# Patient Record
Sex: Female | Born: 1960 | Race: White | Hispanic: No | Marital: Married | Smoking: Never smoker
Health system: Southern US, Community
[De-identification: ages and names within clinical notes are randomized; demographics above are authoritative.]

## PROBLEM LIST (undated history)

## (undated) DIAGNOSIS — M1990 Unspecified osteoarthritis, unspecified site: Secondary | ICD-10-CM

## (undated) DIAGNOSIS — M5090 Cervical disc disorder, unspecified, unspecified cervical region: Secondary | ICD-10-CM

## (undated) DIAGNOSIS — I4819 Other persistent atrial fibrillation: Secondary | ICD-10-CM

## (undated) DIAGNOSIS — Z9889 Other specified postprocedural states: Secondary | ICD-10-CM

## (undated) DIAGNOSIS — Z8679 Personal history of other diseases of the circulatory system: Secondary | ICD-10-CM

## (undated) DIAGNOSIS — K5792 Diverticulitis of intestine, part unspecified, without perforation or abscess without bleeding: Secondary | ICD-10-CM

## (undated) DIAGNOSIS — I495 Sick sinus syndrome: Secondary | ICD-10-CM

## (undated) DIAGNOSIS — R112 Nausea with vomiting, unspecified: Secondary | ICD-10-CM

## (undated) DIAGNOSIS — I739 Peripheral vascular disease, unspecified: Secondary | ICD-10-CM

## (undated) DIAGNOSIS — K760 Fatty (change of) liver, not elsewhere classified: Secondary | ICD-10-CM

## (undated) DIAGNOSIS — Z8774 Personal history of (corrected) congenital malformations of heart and circulatory system: Secondary | ICD-10-CM

## (undated) DIAGNOSIS — H6980 Other specified disorders of Eustachian tube, unspecified ear: Secondary | ICD-10-CM

## (undated) DIAGNOSIS — D131 Benign neoplasm of stomach: Secondary | ICD-10-CM

## (undated) DIAGNOSIS — K1120 Sialoadenitis, unspecified: Secondary | ICD-10-CM

## (undated) DIAGNOSIS — K829 Disease of gallbladder, unspecified: Secondary | ICD-10-CM

## (undated) DIAGNOSIS — K219 Gastro-esophageal reflux disease without esophagitis: Secondary | ICD-10-CM

## (undated) DIAGNOSIS — T7840XA Allergy, unspecified, initial encounter: Secondary | ICD-10-CM

## (undated) DIAGNOSIS — Z8614 Personal history of Methicillin resistant Staphylococcus aureus infection: Secondary | ICD-10-CM

## (undated) DIAGNOSIS — E669 Obesity, unspecified: Secondary | ICD-10-CM

## (undated) DIAGNOSIS — K449 Diaphragmatic hernia without obstruction or gangrene: Secondary | ICD-10-CM

## (undated) DIAGNOSIS — I82409 Acute embolism and thrombosis of unspecified deep veins of unspecified lower extremity: Secondary | ICD-10-CM

## (undated) DIAGNOSIS — F419 Anxiety disorder, unspecified: Secondary | ICD-10-CM

## (undated) HISTORY — DX: Diaphragmatic hernia without obstruction or gangrene: K44.9

## (undated) HISTORY — DX: Fatty (change of) liver, not elsewhere classified: K76.0

## (undated) HISTORY — DX: Nausea with vomiting, unspecified: R11.2

## (undated) HISTORY — PX: JOINT REPLACEMENT: SHX530

## (undated) HISTORY — DX: Disease of gallbladder, unspecified: K82.9

## (undated) HISTORY — DX: Other persistent atrial fibrillation: I48.19

## (undated) HISTORY — DX: Benign neoplasm of stomach: D13.1

## (undated) HISTORY — PX: TUBAL LIGATION: SHX77

## (undated) HISTORY — PX: FOOT SURGERY: SHX648

## (undated) HISTORY — PX: ELBOW SURGERY: SHX618

## (undated) HISTORY — DX: Other specified disorders of Eustachian tube, unspecified ear: H69.80

## (undated) HISTORY — DX: Other specified postprocedural states: Z98.890

## (undated) HISTORY — PX: CERVICAL FUSION: SHX112

## (undated) HISTORY — PX: SHOULDER SURGERY: SHX246

## (undated) HISTORY — PX: WRIST SURGERY: SHX841

## (undated) HISTORY — PX: REDUCTION MAMMAPLASTY: SUR839

## (undated) HISTORY — DX: Allergy, unspecified, initial encounter: T78.40XA

## (undated) HISTORY — PX: SPINE SURGERY: SHX786

## (undated) HISTORY — DX: Sialoadenitis, unspecified: K11.20

## (undated) HISTORY — PX: CHOLECYSTECTOMY: SHX55

## (undated) HISTORY — DX: Acute embolism and thrombosis of unspecified deep veins of unspecified lower extremity: I82.409

## (undated) HISTORY — DX: Personal history of Methicillin resistant Staphylococcus aureus infection: Z86.14

## (undated) HISTORY — DX: Obesity, unspecified: E66.9

## (undated) HISTORY — DX: Cervical disc disorder, unspecified, unspecified cervical region: M50.90

## (undated) HISTORY — DX: Gastro-esophageal reflux disease without esophagitis: K21.9

## (undated) HISTORY — DX: Diverticulitis of intestine, part unspecified, without perforation or abscess without bleeding: K57.92

## (undated) HISTORY — PX: KNEE SURGERY: SHX244

## (undated) HISTORY — PX: FLEXIBLE SIGMOIDOSCOPY: SHX1649

---

## 1998-01-17 ENCOUNTER — Other Ambulatory Visit: Admission: RE | Admit: 1998-01-17 | Discharge: 1998-01-17 | Payer: Self-pay | Admitting: Obstetrics and Gynecology

## 1998-04-15 ENCOUNTER — Other Ambulatory Visit: Admission: RE | Admit: 1998-04-15 | Discharge: 1998-04-15 | Payer: Self-pay | Admitting: Obstetrics & Gynecology

## 1998-06-10 ENCOUNTER — Ambulatory Visit (HOSPITAL_COMMUNITY): Admission: RE | Admit: 1998-06-10 | Discharge: 1998-06-10 | Payer: Self-pay | Admitting: Obstetrics and Gynecology

## 1998-09-05 ENCOUNTER — Ambulatory Visit (HOSPITAL_COMMUNITY): Admission: RE | Admit: 1998-09-05 | Discharge: 1998-09-05 | Payer: Self-pay | Admitting: Obstetrics and Gynecology

## 1998-10-10 ENCOUNTER — Encounter: Payer: Self-pay | Admitting: Obstetrics and Gynecology

## 1998-10-10 ENCOUNTER — Ambulatory Visit (HOSPITAL_COMMUNITY): Admission: RE | Admit: 1998-10-10 | Discharge: 1998-10-10 | Payer: Self-pay | Admitting: Obstetrics and Gynecology

## 1998-10-25 ENCOUNTER — Inpatient Hospital Stay (HOSPITAL_COMMUNITY): Admission: AD | Admit: 1998-10-25 | Discharge: 1998-10-30 | Payer: Self-pay | Admitting: *Deleted

## 1998-11-06 ENCOUNTER — Encounter (HOSPITAL_COMMUNITY): Admission: RE | Admit: 1998-11-06 | Discharge: 1999-02-04 | Payer: Self-pay | Admitting: *Deleted

## 1999-05-16 ENCOUNTER — Other Ambulatory Visit: Admission: RE | Admit: 1999-05-16 | Discharge: 1999-05-16 | Payer: Self-pay | Admitting: *Deleted

## 2000-01-19 ENCOUNTER — Encounter: Payer: Self-pay | Admitting: Allergy and Immunology

## 2000-01-19 ENCOUNTER — Encounter: Admission: RE | Admit: 2000-01-19 | Discharge: 2000-01-19 | Payer: Self-pay | Admitting: Allergy and Immunology

## 2000-06-07 ENCOUNTER — Other Ambulatory Visit: Admission: RE | Admit: 2000-06-07 | Discharge: 2000-06-07 | Payer: Self-pay | Admitting: *Deleted

## 2000-10-19 ENCOUNTER — Ambulatory Visit (HOSPITAL_COMMUNITY): Admission: RE | Admit: 2000-10-19 | Discharge: 2000-10-19 | Payer: Self-pay | Admitting: Obstetrics and Gynecology

## 2000-10-19 ENCOUNTER — Encounter: Payer: Self-pay | Admitting: Obstetrics and Gynecology

## 2000-11-19 ENCOUNTER — Inpatient Hospital Stay (HOSPITAL_COMMUNITY): Admission: AD | Admit: 2000-11-19 | Discharge: 2000-11-19 | Payer: Self-pay | Admitting: Obstetrics and Gynecology

## 2002-10-09 ENCOUNTER — Other Ambulatory Visit: Admission: RE | Admit: 2002-10-09 | Discharge: 2002-10-09 | Payer: Self-pay | Admitting: Obstetrics and Gynecology

## 2002-10-11 ENCOUNTER — Encounter: Payer: Self-pay | Admitting: Obstetrics and Gynecology

## 2002-10-11 ENCOUNTER — Ambulatory Visit (HOSPITAL_COMMUNITY): Admission: RE | Admit: 2002-10-11 | Discharge: 2002-10-11 | Payer: Self-pay | Admitting: Obstetrics and Gynecology

## 2002-10-17 ENCOUNTER — Encounter: Payer: Self-pay | Admitting: Obstetrics and Gynecology

## 2002-10-17 ENCOUNTER — Encounter (INDEPENDENT_AMBULATORY_CARE_PROVIDER_SITE_OTHER): Payer: Self-pay | Admitting: Specialist

## 2002-10-17 ENCOUNTER — Encounter: Admission: RE | Admit: 2002-10-17 | Discharge: 2002-10-17 | Payer: Self-pay | Admitting: Obstetrics and Gynecology

## 2003-01-14 ENCOUNTER — Encounter: Payer: Self-pay | Admitting: *Deleted

## 2003-01-14 ENCOUNTER — Inpatient Hospital Stay (HOSPITAL_COMMUNITY): Admission: EM | Admit: 2003-01-14 | Discharge: 2003-01-15 | Payer: Self-pay

## 2003-01-15 ENCOUNTER — Encounter: Payer: Self-pay | Admitting: Cardiology

## 2003-01-15 ENCOUNTER — Encounter: Payer: Self-pay | Admitting: Cardiovascular Disease

## 2003-07-06 ENCOUNTER — Ambulatory Visit (HOSPITAL_COMMUNITY): Admission: RE | Admit: 2003-07-06 | Discharge: 2003-07-06 | Payer: Self-pay | Admitting: Obstetrics and Gynecology

## 2003-08-18 HISTORY — PX: TUBAL LIGATION: SHX77

## 2003-10-12 ENCOUNTER — Inpatient Hospital Stay (HOSPITAL_COMMUNITY): Admission: AD | Admit: 2003-10-12 | Discharge: 2003-10-12 | Payer: Self-pay | Admitting: Obstetrics and Gynecology

## 2003-11-19 ENCOUNTER — Encounter (INDEPENDENT_AMBULATORY_CARE_PROVIDER_SITE_OTHER): Payer: Self-pay | Admitting: *Deleted

## 2003-11-19 ENCOUNTER — Inpatient Hospital Stay (HOSPITAL_COMMUNITY): Admission: RE | Admit: 2003-11-19 | Discharge: 2003-11-23 | Payer: Self-pay | Admitting: Obstetrics and Gynecology

## 2004-04-07 ENCOUNTER — Other Ambulatory Visit: Admission: RE | Admit: 2004-04-07 | Discharge: 2004-04-07 | Payer: Self-pay | Admitting: Obstetrics and Gynecology

## 2004-08-05 ENCOUNTER — Ambulatory Visit: Payer: Self-pay | Admitting: Internal Medicine

## 2004-08-17 HISTORY — PX: CHOLECYSTECTOMY: SHX55

## 2004-08-27 ENCOUNTER — Ambulatory Visit: Payer: Self-pay | Admitting: Internal Medicine

## 2005-02-10 ENCOUNTER — Ambulatory Visit: Payer: Self-pay | Admitting: Cardiology

## 2005-02-11 ENCOUNTER — Ambulatory Visit: Payer: Self-pay | Admitting: Cardiology

## 2005-03-04 ENCOUNTER — Ambulatory Visit: Payer: Self-pay

## 2005-03-19 ENCOUNTER — Ambulatory Visit: Payer: Self-pay

## 2005-04-15 ENCOUNTER — Ambulatory Visit: Payer: Self-pay | Admitting: Cardiology

## 2005-04-20 ENCOUNTER — Emergency Department (HOSPITAL_COMMUNITY): Admission: EM | Admit: 2005-04-20 | Discharge: 2005-04-20 | Payer: Self-pay | Admitting: Emergency Medicine

## 2005-06-26 ENCOUNTER — Other Ambulatory Visit: Admission: RE | Admit: 2005-06-26 | Discharge: 2005-06-26 | Payer: Self-pay | Admitting: Obstetrics and Gynecology

## 2006-02-11 ENCOUNTER — Encounter: Admission: RE | Admit: 2006-02-11 | Discharge: 2006-02-11 | Payer: Self-pay | Admitting: Orthopaedic Surgery

## 2006-02-25 ENCOUNTER — Inpatient Hospital Stay (HOSPITAL_COMMUNITY): Admission: RE | Admit: 2006-02-25 | Discharge: 2006-02-26 | Payer: Self-pay | Admitting: Orthopaedic Surgery

## 2006-06-29 ENCOUNTER — Ambulatory Visit: Payer: Self-pay | Admitting: Internal Medicine

## 2006-07-06 ENCOUNTER — Ambulatory Visit (HOSPITAL_BASED_OUTPATIENT_CLINIC_OR_DEPARTMENT_OTHER): Admission: RE | Admit: 2006-07-06 | Discharge: 2006-07-06 | Payer: Self-pay | Admitting: Orthopedic Surgery

## 2006-07-21 ENCOUNTER — Ambulatory Visit (HOSPITAL_COMMUNITY): Admission: RE | Admit: 2006-07-21 | Discharge: 2006-07-21 | Payer: Self-pay | Admitting: Obstetrics and Gynecology

## 2006-08-17 DIAGNOSIS — Z8614 Personal history of Methicillin resistant Staphylococcus aureus infection: Secondary | ICD-10-CM

## 2006-08-17 HISTORY — DX: Personal history of Methicillin resistant Staphylococcus aureus infection: Z86.14

## 2006-10-20 ENCOUNTER — Encounter: Admission: RE | Admit: 2006-10-20 | Discharge: 2006-10-20 | Payer: Self-pay | Admitting: Orthopaedic Surgery

## 2006-11-15 ENCOUNTER — Ambulatory Visit: Payer: Self-pay | Admitting: Internal Medicine

## 2006-11-15 LAB — CONVERTED CEMR LAB
ALT: 22 units/L (ref 0–40)
AST: 21 units/L (ref 0–37)
Albumin: 4.3 g/dL (ref 3.5–5.2)
Alkaline Phosphatase: 80 units/L (ref 39–117)
Amylase: 51 units/L (ref 27–131)
BUN: 13 mg/dL (ref 6–23)
Basophils Absolute: 0 10*3/uL (ref 0.0–0.1)
Basophils Relative: 0.5 % (ref 0.0–1.0)
Bilirubin, Direct: 0.1 mg/dL (ref 0.0–0.3)
CO2: 28 meq/L (ref 19–32)
Calcium: 9.3 mg/dL (ref 8.4–10.5)
Chloride: 104 meq/L (ref 96–112)
Cholesterol: 205 mg/dL (ref 0–200)
Creatinine, Ser: 0.7 mg/dL (ref 0.4–1.2)
Direct LDL: 139.5 mg/dL
Eosinophils Absolute: 0.1 10*3/uL (ref 0.0–0.6)
Eosinophils Relative: 0.8 % (ref 0.0–5.0)
GFR calc Af Amer: 117 mL/min
GFR calc non Af Amer: 97 mL/min
Glucose, Bld: 103 mg/dL — ABNORMAL HIGH (ref 70–99)
HCT: 39.4 % (ref 36.0–46.0)
HDL: 54 mg/dL (ref >39.0)
Hemoglobin: 13.6 g/dL (ref 12.0–15.0)
Lymphocytes Relative: 20.8 % (ref 12.0–46.0)
MCHC: 34.5 g/dL (ref 30.0–36.0)
MCV: 87.4 fL (ref 78.0–100.0)
Monocytes Absolute: 0.4 10*3/uL (ref 0.2–0.7)
Monocytes Relative: 5 % (ref 3.0–11.0)
Neutro Abs: 5.2 10*3/uL (ref 1.4–7.7)
Neutrophils Relative %: 72.9 % (ref 43.0–77.0)
Platelets: 337 10*3/uL (ref 150–400)
Potassium: 3.6 meq/L (ref 3.5–5.1)
RBC: 4.51 M/uL (ref 3.87–5.11)
RDW: 13.1 % (ref 11.5–14.6)
Sodium: 138 meq/L (ref 135–145)
Total Bilirubin: 0.8 mg/dL (ref 0.3–1.2)
Total CHOL/HDL Ratio: 3.8
Total Protein: 7 g/dL (ref 6.0–8.3)
Triglycerides: 59 mg/dL (ref 0–149)
VLDL: 12 mg/dL (ref 0–40)
WBC: 7.2 10*3/uL (ref 4.5–10.5)

## 2006-11-16 ENCOUNTER — Ambulatory Visit: Payer: Self-pay | Admitting: Gastroenterology

## 2006-12-13 ENCOUNTER — Encounter (INDEPENDENT_AMBULATORY_CARE_PROVIDER_SITE_OTHER): Payer: Self-pay | Admitting: Specialist

## 2006-12-13 ENCOUNTER — Ambulatory Visit (HOSPITAL_COMMUNITY): Admission: RE | Admit: 2006-12-13 | Discharge: 2006-12-13 | Payer: Self-pay | Admitting: General Surgery

## 2007-02-17 ENCOUNTER — Ambulatory Visit: Payer: Self-pay | Admitting: Cardiology

## 2007-03-01 ENCOUNTER — Ambulatory Visit: Payer: Self-pay

## 2007-03-01 ENCOUNTER — Encounter: Payer: Self-pay | Admitting: Cardiology

## 2007-03-05 DIAGNOSIS — I4891 Unspecified atrial fibrillation: Secondary | ICD-10-CM

## 2007-03-05 DIAGNOSIS — I4819 Other persistent atrial fibrillation: Secondary | ICD-10-CM

## 2007-03-05 HISTORY — DX: Other persistent atrial fibrillation: I48.19

## 2007-03-07 ENCOUNTER — Ambulatory Visit: Payer: Self-pay | Admitting: Infectious Disease

## 2007-03-25 ENCOUNTER — Inpatient Hospital Stay (HOSPITAL_COMMUNITY): Admission: RE | Admit: 2007-03-25 | Discharge: 2007-03-29 | Payer: Self-pay | Admitting: Specialist

## 2007-07-22 ENCOUNTER — Encounter (INDEPENDENT_AMBULATORY_CARE_PROVIDER_SITE_OTHER): Payer: Self-pay | Admitting: Specialist

## 2007-07-22 ENCOUNTER — Ambulatory Visit: Admission: RE | Admit: 2007-07-22 | Discharge: 2007-07-22 | Payer: Self-pay | Admitting: Specialist

## 2007-07-22 ENCOUNTER — Ambulatory Visit: Payer: Self-pay | Admitting: *Deleted

## 2007-07-25 ENCOUNTER — Ambulatory Visit (HOSPITAL_COMMUNITY): Admission: RE | Admit: 2007-07-25 | Discharge: 2007-07-25 | Payer: Self-pay | Admitting: Obstetrics and Gynecology

## 2008-02-09 ENCOUNTER — Encounter: Admission: RE | Admit: 2008-02-09 | Discharge: 2008-02-09 | Payer: Self-pay | Admitting: Orthopedic Surgery

## 2008-04-24 ENCOUNTER — Encounter: Admission: RE | Admit: 2008-04-24 | Discharge: 2008-04-24 | Payer: Self-pay | Admitting: Orthopaedic Surgery

## 2008-04-27 ENCOUNTER — Ambulatory Visit (HOSPITAL_COMMUNITY): Admission: RE | Admit: 2008-04-27 | Discharge: 2008-04-27 | Payer: Self-pay | Admitting: Orthopedic Surgery

## 2008-08-13 ENCOUNTER — Ambulatory Visit (HOSPITAL_COMMUNITY): Admission: RE | Admit: 2008-08-13 | Discharge: 2008-08-13 | Payer: Self-pay | Admitting: Obstetrics and Gynecology

## 2008-08-17 HISTORY — PX: CERVICAL FUSION: SHX112

## 2008-08-29 ENCOUNTER — Ambulatory Visit: Payer: Self-pay | Admitting: Cardiology

## 2008-08-29 LAB — CONVERTED CEMR LAB
BUN: 7 mg/dL (ref 6–23)
Calcium: 9.2 mg/dL (ref 8.4–10.5)
Chloride: 106 meq/L (ref 96–112)
Creatinine, Ser: 0.7 mg/dL (ref 0.4–1.2)
GFR calc Af Amer: 116 mL/min

## 2008-11-19 ENCOUNTER — Encounter: Payer: Self-pay | Admitting: Cardiology

## 2008-11-19 ENCOUNTER — Ambulatory Visit: Payer: Self-pay | Admitting: Cardiology

## 2008-11-19 LAB — CONVERTED CEMR LAB
BUN: 11 mg/dL (ref 6–23)
Basophils Relative: 0.6 % (ref 0.0–3.0)
CO2: 29 meq/L (ref 19–32)
Chloride: 105 meq/L (ref 96–112)
Creatinine, Ser: 0.7 mg/dL (ref 0.4–1.2)
Eosinophils Absolute: 0.2 10*3/uL (ref 0.0–0.7)
Eosinophils Relative: 1.6 % (ref 0.0–5.0)
GFR calc non Af Amer: 95.9 mL/min (ref >60)
MCV: 84 fL (ref 78.0–100.0)
Neutrophils Relative %: 76.4 % (ref 43.0–77.0)
Platelets: 332 10*3/uL (ref 150.0–400.0)
Potassium: 3.9 meq/L (ref 3.5–5.1)
RBC: 4.54 M/uL (ref 3.87–5.11)
RDW: 12.7 % (ref 11.5–14.6)
Sodium: 141 meq/L (ref 135–145)
TSH: 0.6 microintl units/mL (ref 0.35–5.50)
WBC: 10.1 10*3/uL (ref 4.5–10.5)

## 2008-12-04 ENCOUNTER — Encounter: Payer: Self-pay | Admitting: Cardiology

## 2008-12-04 ENCOUNTER — Ambulatory Visit: Payer: Self-pay | Admitting: Cardiology

## 2009-05-14 ENCOUNTER — Encounter: Admission: RE | Admit: 2009-05-14 | Discharge: 2009-05-14 | Payer: Self-pay | Admitting: *Deleted

## 2009-05-21 ENCOUNTER — Encounter: Admission: RE | Admit: 2009-05-21 | Discharge: 2009-05-21 | Payer: Self-pay | Admitting: Orthopaedic Surgery

## 2009-06-27 ENCOUNTER — Ambulatory Visit: Payer: Self-pay | Admitting: Internal Medicine

## 2009-08-21 ENCOUNTER — Encounter: Admission: RE | Admit: 2009-08-21 | Discharge: 2009-08-21 | Payer: Self-pay | Admitting: Plastic Surgery

## 2009-10-31 ENCOUNTER — Encounter: Payer: Self-pay | Admitting: Internal Medicine

## 2009-11-08 ENCOUNTER — Telehealth (INDEPENDENT_AMBULATORY_CARE_PROVIDER_SITE_OTHER): Payer: Self-pay | Admitting: Physician Assistant

## 2009-11-18 ENCOUNTER — Encounter: Payer: Self-pay | Admitting: Internal Medicine

## 2009-12-09 ENCOUNTER — Encounter: Admission: RE | Admit: 2009-12-09 | Discharge: 2009-12-09 | Payer: Self-pay | Admitting: Orthopaedic Surgery

## 2009-12-23 ENCOUNTER — Ambulatory Visit: Payer: Self-pay | Admitting: Internal Medicine

## 2009-12-23 DIAGNOSIS — K219 Gastro-esophageal reflux disease without esophagitis: Secondary | ICD-10-CM

## 2009-12-23 HISTORY — DX: Gastro-esophageal reflux disease without esophagitis: K21.9

## 2009-12-24 LAB — CONVERTED CEMR LAB
ALT: 14 units/L (ref 0–35)
Albumin: 4.6 g/dL (ref 3.5–5.2)
Alkaline Phosphatase: 87 units/L (ref 39–117)
BUN: 12 mg/dL (ref 6–23)
Basophils Absolute: 0 10*3/uL (ref 0.0–0.1)
Basophils Relative: 0.3 % (ref 0.0–3.0)
Bilirubin, Direct: 0.1 mg/dL (ref 0.0–0.3)
Calcium: 9.6 mg/dL (ref 8.4–10.5)
Eosinophils Absolute: 0 10*3/uL (ref 0.0–0.7)
Eosinophils Relative: 0.5 % (ref 0.0–5.0)
Glucose, Bld: 91 mg/dL (ref 70–99)
Lymphocytes Relative: 19.7 % (ref 12.0–46.0)
Lymphs Abs: 1.7 10*3/uL (ref 0.7–4.0)
Monocytes Relative: 5.5 % (ref 3.0–12.0)
Neutro Abs: 6.5 10*3/uL (ref 1.4–7.7)
Neutrophils Relative %: 74 % (ref 43.0–77.0)
Platelets: 344 10*3/uL (ref 150.0–400.0)
TSH: 2.27 microintl units/mL (ref 0.35–5.50)
Total Bilirubin: 0.6 mg/dL (ref 0.3–1.2)
Total Protein: 7.1 g/dL (ref 6.0–8.3)

## 2009-12-26 ENCOUNTER — Encounter (INDEPENDENT_AMBULATORY_CARE_PROVIDER_SITE_OTHER): Payer: Self-pay | Admitting: *Deleted

## 2010-01-10 ENCOUNTER — Encounter (INDEPENDENT_AMBULATORY_CARE_PROVIDER_SITE_OTHER): Payer: Self-pay | Admitting: *Deleted

## 2010-01-16 ENCOUNTER — Ambulatory Visit: Payer: Self-pay | Admitting: Internal Medicine

## 2010-01-21 ENCOUNTER — Telehealth: Payer: Self-pay | Admitting: Internal Medicine

## 2010-01-30 ENCOUNTER — Ambulatory Visit: Payer: Self-pay | Admitting: Internal Medicine

## 2010-01-30 HISTORY — PX: UPPER GASTROINTESTINAL ENDOSCOPY: SHX188

## 2010-02-02 ENCOUNTER — Encounter: Payer: Self-pay | Admitting: Internal Medicine

## 2010-02-05 ENCOUNTER — Encounter: Payer: Self-pay | Admitting: Internal Medicine

## 2010-03-18 ENCOUNTER — Ambulatory Visit: Payer: Self-pay | Admitting: Internal Medicine

## 2010-03-18 DIAGNOSIS — H6980 Other specified disorders of Eustachian tube, unspecified ear: Secondary | ICD-10-CM | POA: Insufficient documentation

## 2010-03-18 DIAGNOSIS — K1120 Sialoadenitis, unspecified: Secondary | ICD-10-CM | POA: Insufficient documentation

## 2010-04-29 ENCOUNTER — Encounter: Payer: Self-pay | Admitting: Internal Medicine

## 2010-05-02 ENCOUNTER — Ambulatory Visit: Payer: Self-pay | Admitting: Internal Medicine

## 2010-05-06 ENCOUNTER — Encounter: Payer: Self-pay | Admitting: Internal Medicine

## 2010-05-16 ENCOUNTER — Telehealth: Payer: Self-pay | Admitting: Internal Medicine

## 2010-09-07 ENCOUNTER — Encounter: Payer: Self-pay | Admitting: Orthopaedic Surgery

## 2010-09-08 ENCOUNTER — Encounter: Payer: Self-pay | Admitting: Orthopedic Surgery

## 2010-09-10 ENCOUNTER — Encounter
Admission: RE | Admit: 2010-09-10 | Discharge: 2010-09-10 | Payer: Self-pay | Source: Home / Self Care | Attending: Obstetrics and Gynecology | Admitting: Obstetrics and Gynecology

## 2010-09-15 ENCOUNTER — Ambulatory Visit
Admission: RE | Admit: 2010-09-15 | Discharge: 2010-09-15 | Payer: Self-pay | Source: Home / Self Care | Attending: Internal Medicine | Admitting: Internal Medicine

## 2010-09-15 DIAGNOSIS — J029 Acute pharyngitis, unspecified: Secondary | ICD-10-CM | POA: Insufficient documentation

## 2010-09-16 NOTE — Progress Notes (Signed)
  Phone Note Call from Patient   Reason for Call: Talk to Doctor Action Taken: Phone Call Completed Summary of Call: Pt called because she is having mult PVC's sounds like trigeminy from the description. She states Sx began today with a migraine. She feels stressed because of work and did not sleep well last pm. She took her Toprol at 6pm as usual. Advised her that with nl EF and MV she was not in danger, could take extra 1/2 Toprol at 10pm if Sx persist. She will do so, will call next wk for f/u if she does not get better. Initial call taken by: Park Breed PA-C,  November 08, 2009 8:00 PM

## 2010-09-16 NOTE — Progress Notes (Signed)
Summary: samples needed  Phone Note Call from Patient Call back at [phone]   Caller: Patient---live call Summary of Call: Need more Nexium samples unitl her Endo appt, which is 01-30-2010. Initial call taken by: Warnell Forester,  January 21, 2010 8:22 AM  Follow-up for Phone Call        There are no samples available.  Left message to inform pt. Follow-up by: Gladis Riffle, RN,  January 21, 2010 3:54 PM  Additional Follow-up for Phone Call Additional follow up Details #1::        [facility].  See Rx.  Pt aware. Additional Follow-up by: Gladis Riffle, RN,  January 21, 2010 4:42 PM    Prescriptions: NEXIUM 40 MG CPDR (ESOMEPRAZOLE MAGNESIUM) Take 1 tab each morning  #30 x 0   Entered by:   Gladis Riffle, RN   Authorized by:   Birdie Sons MD   Signed by:   Gladis Riffle, RN on 01/21/2010   Method used:   [facility]  [phone]* (retail)       [address] [place], [place]  [address]       Ph: [phone] or [phone]       Fax: [phone]   RxID:   [id number]

## 2010-09-16 NOTE — Letter (Signed)
Summary: [facility]  [facility]   Imported By: Maryln Gottron 02/25/2010 13:33:11  _____________________________________________________________________  External Attachment:    Type:   Image     Comment:   External Document

## 2010-09-16 NOTE — Letter (Signed)
Summary: Patient [facility] Biopsy Results  [facility]  [address] [place], [place] [address]   Phone: [phone]  Fax: [phone]        February 02, 2010 MRN: [id number]    [facility] [place] [address] [place], [place]  [address]    Dear Ms. Hubbard,  I am pleased to inform you that the biopsies taken during your recent endoscopic examination did not show any evidence of cancer upon pathologic examination.  The polyp was a benign fundic gland polyp that needs no specific follow-up.  Continue with the treatment plan as outlined on the day of your      exam and see me in august if you fail to improve (see plan on endoscopy report).  Please call us if you are having persistent problems or have questions about your condition that have not been fully answered at this time.  Sincerely,  Iva Boop MD, [facility]  This letter has been electronically signed by your physician.  Appended Document: Patient Notice-Endo Biopsy Results letter mailed.

## 2010-09-16 NOTE — Letter (Signed)
Summary: Previsit letter  [facility]  [address] [place], [place] [address]   Phone: [phone]  Fax: [phone]       12/26/2009 MRN: [id number]  [facility] [place] [address] [place], [place]  [address]  Dear Ms. Abrell,  Welcome to the Gastroenterology Division at [facility].    You are scheduled to see a nurse for your pre-procedure visit on 01/16/10 at 2:30pm on the 3rd floor at [facility], [address]  We ask that you try to arrive at our office 15 minutes prior to your appointment time to allow for check-in.  Your nurse visit will consist of discussing your medical and surgical history, your immediate family medical history, and your medications.    Please bring a complete list of all your medications or, if you prefer, bring the medication bottles and we will list them.  We will need to be aware of both prescribed and over the counter drugs.  We will need to know exact dosage information as well.  If you are on blood thinners (Coumadin, Plavix, Aggrenox, Ticlid, etc.) please call our office today/prior to your appointment, as we need to consult with your physician about holding your medication.   Please be prepared to read and sign documents such as consent forms, a financial agreement, and acknowledgement forms.  If necessary, and with your consent, a friend or relative is welcome to sit-in on the nurse visit with you.  Please bring your insurance card so that we may make a copy of it.  If your insurance requires a referral to see a specialist, please bring your referral form from your primary care physician.  No co-pay is required for this nurse visit.     If you cannot keep your appointment, please call [phone] to cancel or reschedule prior to your appointment date.  This allows [place] the opportunity to schedule an appointment for another patient in need of care.    Thank you for choosing [facility] for your medical  needs.  We appreciate the opportunity to care for you.  Please visit [place] at our website  to learn more about our practice.                     Sincerely.                                                                                                                   The Gastroenterology Division

## 2010-09-16 NOTE — Assessment & Plan Note (Signed)
Summary: ? virus//ccm   Vital Signs:  Patient profile:   50 year old female Weight:      228 pounds BMI:     31.91 Temp:     99 degrees F Pulse rate:   72 / minute Pulse rhythm:   regular Resp:     14 per minute BP sitting:   134 / 86  (left arm) Cuff size:   regular  Vitals Entered By: Gladis Riffle, RN (Dec 23, 2009 10:12 AM) CC: stomach bug two weeks ago, acid reflux with throat burning--has not resolved, also ears swollen Is Patient Diabetic? No   CC:  stomach bug two weeks ago, acid reflux with throat burning--has not resolved, and also ears swollen.  History of Present Illness: she complains of GERD ongoing for years---has taken OTC PPI she stopped a few weeks ago and since then has had raspy voice and ear pressure went to see allergist---put on avelox---no relief then put on prednisone dose pack---no relief  All other systems reviewed and were negative   Preventive Screening-Counseling & Management  Alcohol-Tobacco     Smoking Status: never     Passive Smoke Exposure: no  Current Problems (verified): 1)  Headache  (ICD-784.0) 2)  Palpitations, Recurrent  (ICD-785.1) 3)  Atrial Fibrillation  (ICD-427.31) 4)  Osteoarthritis  (ICD-715.90)  Current Medications (verified): 1)  Metoprolol Succinate 50 Mg Xr24h-Tab (Metoprolol Succinate) .... Take One Tablet By Mouth Daily 2)  Aspirin 81 Mg  Tabs (Aspirin) .Marland Kitchen.. 1 Tab By Mouth Once Daily 3)  Xyzal 5 Mg Tabs (Levocetirizine Dihydrochloride) .... Once Daily 4)  Nasonex 50 Mcg/act Susp (Mometasone Furoate) .... Use Two Times A Day As Directed 5)  Avelox Abc Pack 400 Mg Tabs (Moxifloxacin Hcl) .... Take Daily Until Gone  Allergies (verified): No Known Drug Allergies  Past History:  Past Medical History: Osteoarthritis 1. Possible MRSA infection near operative wound in May of 2008 2. History of paroxysmal atrial fibrillation. 3. History of premature ventricular contractions. 4. Hyperlipidemia.  cervical disk  disease Headache Allergic rhinitis GERD  Physical Exam  General:  alert and overweight-appearing. blood pressure 150/80 Head:  normocephalic and atraumatic.   Eyes:  pupils equal and pupils round.   Neck:  No deformities, masses, or tenderness noted. Lungs:  normal respiratory effort and no intercostal retractions.   Heart:  normal rate and regular rhythm.   Abdomen:  soft and non-tender.   Skin:  turgor normal and color normal.   Psych:  normally interactive and not anxious appearing.     Impression & Recommendations:  Problem # 1:  GERD (ICD-530.81) this could be the explanation for all of her complaints needs PPI needs GI referral---consider endoscopy Her updated medication list for this problem includes:    Nexium 40 Mg Cpdr (Esomeprazole magnesium) .Marland Kitchen... Take 1 tab each morning  Orders: Venipuncture ([address]) Gastroenterology Referral (GI) TLB-CBC Platelet - w/Differential (85025-CBCD)  Complete Medication List: 1)  Metoprolol Succinate 50 Mg Xr24h-tab (Metoprolol succinate) .... Take one tablet by mouth daily 2)  Aspirin 81 Mg Tabs (Aspirin) .Marland Kitchen.. 1 tab by mouth once daily 3)  Xyzal 5 Mg Tabs (Levocetirizine dihydrochloride) .... Once daily 4)  Nasonex 50 Mcg/act Susp (Mometasone furoate) .... Use two times a day as directed 5)  Nexium 40 Mg Cpdr (Esomeprazole magnesium) .... Take 1 tab each morning  Other Orders: TLB-TSH (Thyroid Stimulating Hormone) (84443-TSH) TLB-Hepatic/Liver Function Pnl (80076-HEPATIC) TLB-BMP (Basic Metabolic Panel-BMET) (80048-METABOL) Prescriptions: OMEPRAZOLE 20 MG CPDR (OMEPRAZOLE) one by mouth  daily  #90 x 3   Entered and Authorized by:   Birdie Sons MD   Signed by:   Birdie Sons MD on 12/23/2009   Method used:   [facility]        [facility]  [phone]* (retail)       [address] [place], [place]  [address]       Ph: [phone] or [phone]       Fax: [phone]   RxID:   [id number]

## 2010-09-16 NOTE — Letter (Signed)
Summary: [facility]  [facility]   Imported By: Maryln Gottron 12/04/2009 14:03:03  _____________________________________________________________________  External Attachment:    Type:   Image     Comment:   External Document

## 2010-09-16 NOTE — Assessment & Plan Note (Signed)
Summary: swollen glands//ccm   Vital Signs:  Patient profile:   50 year old female Temp:     97.8 degrees F oral Pulse rate:   72 / minute Pulse rhythm:   regular Resp:     12 per minute BP sitting:   128 / 90  (left arm) Cuff size:   regular  Vitals Entered By: Gladis Riffle, RN (March 18, 2010 8:36 AM) CC: c/o swollen glands at neck, worse after eating something sour--also ear fullness Is Patient Diabetic? No   CC:  c/o swollen glands at neck and worse after eating something sour--also ear fullness.  History of Present Illness: c/o chronic ear fullness---"pops a lot"  complains of swollen submandibular gland right after eating anything sour---resolves spontaneously.   GERD--sxs controlled on meds  All other systems reviewed and were negative   Preventive Screening-Counseling & Management  Alcohol-Tobacco     Smoking Status: never     Passive Smoke Exposure: no  Current Medications (verified): 1)  Metoprolol Succinate 50 Mg Xr24h-Tab (Metoprolol Succinate) .... Take One Tablet By Mouth Daily 2)  Aspirin 81 Mg  Tabs (Aspirin) .Marland Kitchen.. 1 Tab By Mouth Once Daily 3)  Xyzal 5 Mg Tabs (Levocetirizine Dihydrochloride) .... Once Daily 4)  Nasonex 50 Mcg/act Susp (Mometasone Furoate) .... Use Two Times A Day As Directed 5)  Nexium 40 Mg Cpdr (Esomeprazole Magnesium) .... Take 1 Tab Each Morning 6)  Prometrium 100 Mg Caps (Progesterone Micronized) .... Take 1 Capsule By Mouth At Bedtime 7)  Diazepam 5 Mg Tabs (Diazepam) .... As Needed Neck Spasms  Allergies (verified): No Known Drug Allergies  Past History:  Past Medical History: Last updated: 12/23/2009 Osteoarthritis 1. Possible MRSA infection near operative wound in May of 2008 2. History of paroxysmal atrial fibrillation. 3. History of premature ventricular contractions. 4. Hyperlipidemia.  cervical disk disease Headache Allergic rhinitis GERD  Past Surgical History: Last updated: 03/07/2007 Six arthroscopic  surgeries on left knee Laparoscopic cholecystectomy in May of 2008  Family History: Last updated: 03/07/2007 Mom and Dad at 43 and 47 healthy  Social History: Last updated: 03/07/2007 Works in case Insurance account manager, orthopaedic, neurosurgeon offices Married Never Smoked Alcohol use-no Drug use-no Regular exercise with bike  Risk Factors: Exercise: yes (03/07/2007)  Risk Factors: Smoking Status: never (03/18/2010) Passive Smoke Exposure: no (03/18/2010)  Physical Exam  General:  alert and overweight-appearing. blood pressure 150/80 Head:  normocephalic and atraumatic.   Eyes:  pupils equal and pupils round.   Ears:  R ear normal and L ear normal.   Nose:  no external deformity and no external erythema.   Mouth:  pharynx pink and moist.   Neck:  No deformities, masses, or tenderness noted. Lungs:  normal respiratory effort.   Heart:  normal rate and regular rhythm.   Abdomen:  soft and non-tender.   Msk:  No deformity or scoliosis noted of thoracic or lumbar spine.   Skin:  turgor normal and color normal.     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 Take 1 tab each  morning  Complete Medication List: 1)  Metoprolol Succinate 50 Mg Xr24h-tab (Metoprolol succinate) .... Take one tablet by mouth daily 2)  Aspirin 81 Mg Tabs (Aspirin) .Marland Kitchen.. 1 tab by mouth once daily 3)  Xyzal 5 Mg Tabs (Levocetirizine dihydrochloride) .... Once daily 4)  Fluticasone Propionate 50  Mcg/act Susp (Fluticasone propionate) .... 2 sprays each nostril once daily 5)  Nexium 40 Mg Cpdr (Esomeprazole magnesium) .... Take 1 tab each morning 6)  Prometrium 100 Mg Caps (Progesterone micronized) .... Take 1 capsule by mouth at bedtime 7)  Diazepam 5 Mg Tabs (Diazepam) .... As needed neck spasms Prescriptions: FLUTICASONE PROPIONATE 50 MCG/ACT  SUSP (FLUTICASONE PROPIONATE) 2 sprays each nostril once daily  #1 vial x 3   Entered and Authorized by:   Birdie Sons MD   Signed by:   Birdie Sons MD on 03/18/2010   Method used:   [facility]        [facility]  [phone][facility] (retail)       [address] [place], [place]  [address]       Ph: [phone] or [phone]       Fax: [phone]   RxID:   [id number]

## 2010-09-16 NOTE — Assessment & Plan Note (Signed)
Summary: consult re: salivary stone/cjr   Vital Signs:  Patient profile:   50 year old female Weight:      242 pounds Temp:     98.2 degrees F oral BP sitting:   130 / 90  (left arm) Cuff size:   regular  Vitals Entered By: Kern Reap CMA Duncan Dull) (May 02, 2010 10:54 AM) CC: swollen glan Is Patient Diabetic? No Pain Assessment Patient in pain? no        CC:  swollen glan.  History of Present Illness: recurrent salivary gland swelling no real pain massage seem sto help no relief with salivary glance simulation  no fever or chills  All other systems reviewed and were negative   Current Problems (verified): 1)  Sialoadenitis  (ICD-527.2) 2)  Eustachian Tube Dysfunction, Left  (ICD-381.81) 3)  Gerd  (ICD-530.81) 4)  Atrial Fibrillation  (ICD-427.31)  Current Medications (verified): 1)  Metoprolol Succinate 50 Mg Xr24h-Tab (Metoprolol Succinate) .... Take One Tablet By Mouth Daily 2)  Aspirin 81 Mg  Tabs (Aspirin) .Marland Kitchen.. 1 Tab By Mouth Once Daily 3)  Xyzal 5 Mg Tabs (Levocetirizine Dihydrochloride) .... Once Daily 4)  Fluticasone Propionate 50 Mcg/act  Susp (Fluticasone Propionate) .... 2 Sprays Each Nostril Once Daily 5)  Nexium 40 Mg Cpdr (Esomeprazole Magnesium) .... Take 1 Tab Each Morning 6)  Prometrium 100 Mg Caps (Progesterone Micronized) .... Take 1 Capsule By Mouth At Bedtime 7)  Diazepam 5 Mg Tabs (Diazepam) .... As Needed Neck Spasms  Allergies (verified): No Known Drug Allergies  Review of Systems       Flu Vaccine Consent Questions     Do you have a history of severe allergic reactions to this vaccine? no    Any prior history of allergic reactions to egg and/or gelatin? no    Do you have a sensitivity to the preservative Thimersol? no    Do you have a past history of Guillan-Barre Syndrome? no    Do you currently have an acute febrile illness? no    Have you ever had a severe reaction to latex? no    Vaccine information given and explained to  patient? yes    Are you currently pregnant? no    Lot Number:[id number]   Exp Date:02/14/2011   Site Given  Left Deltoid IM   Physical Exam  General:  alert and well-developed.   Head:  normocephalic and atraumatic.   Eyes:  pupils equal and pupils round.   Ears:  R ear normal and L ear normal.   Mouth:  submandibular galnd swelling Neck:  No deformities, masses, or tenderness noted. Lungs:  normal respiratory effort.   Heart:  normal rate and regular rhythm.   Abdomen:  soft and non-tender.     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 Take one tablet by mouth daily 2)  Aspirin 81 Mg Tabs (Aspirin) .Marland Kitchen.. 1 tab by mouth once daily 3)  Xyzal 5 Mg Tabs (Levocetirizine dihydrochloride) .... Once daily 4)  Fluticasone Propionate 50 Mcg/act Susp (Fluticasone propionate) .... 2 sprays each nostril once daily 5)  Nexium 40 Mg Cpdr (Esomeprazole magnesium) .... Take 1 tab each morning 6)  Prometrium 100 Mg Caps (Progesterone micronized) .... Take 1 capsule by mouth at bedtime 7)  Diazepam 5 Mg Tabs (Diazepam) .... As  needed neck spasms  Other Orders: Admin 1st Vaccine ([address]) Flu Vaccine 8yrs + [phone])

## 2010-09-16 NOTE — Consult Note (Signed)
Summary: [facility]  [facility]   Imported By: Maryln Gottron 05/13/2010 15:07:31  _____________________________________________________________________  External Attachment:    Type:   Image     Comment:   External Document

## 2010-09-16 NOTE — Letter (Signed)
Summary: EGD Instructions  [facility]  [address] [place], [place] [address]   Phone: [phone]  Fax: [phone]       [facility] [place]    09-08-1960    MRN: [id number]       Procedure Day /Date: Thursday 01/30/2010     Arrival Time:  8:30 am     Procedure Time: 9:30 am     Location of Procedure:                    _ x _ [facility] (4th Floor)    PREPARATION FOR ENDOSCOPY   On Thursday 6/16 THE DAY OF THE PROCEDURE:  1.   No solid foods, milk or milk products are allowed after midnight the night before your procedure.  2.   Do not drink anything colored red or purple.  Avoid juices with pulp.  No orange juice.  3.  You may drink clear liquids until 7:30 am, which is 2 hours before your procedure.                                                                                                CLEAR LIQUIDS INCLUDE: Water Jello Ice Popsicles Tea (sugar ok, no milk/cream) Powdered fruit flavored drinks Coffee (sugar ok, no milk/cream) Gatorade Juice: apple, white grape, white cranberry  Lemonade Clear bullion, consomm, broth Carbonated beverages (any kind) Strained chicken noodle soup Hard Candy   MEDICATION INSTRUCTIONS  Unless otherwise instructed, you should take regular prescription medications with a small sip of water as early as possible the morning of your procedure.             OTHER INSTRUCTIONS  You will need a responsible adult at least 48 years of age to accompany you and drive you home.   This person must remain in the waiting room during your procedure.  Wear loose fitting clothing that is easily removed.  Leave jewelry and other valuables at home.  However, you may wish to bring a book to read or an iPod/MP3 player to listen to music as you wait for your procedure to start.  Remove all body piercing jewelry and leave at home.  Total time from sign-in until discharge is approximately 2-3 hours.  You  should go home directly after your procedure and rest.  You can resume normal activities the day after your procedure.  The day of your procedure you should not:   Drive   Make legal decisions   Operate machinery   Drink alcohol   Return to work  You will receive specific instructions about eating, activities and medications before you leave.    The above instructions have been reviewed and explained to me by   Ezra Sites RN  January 16, 2010 3:10 PM    I fully understand and can verbalize these instructions _____________________________ Date _________

## 2010-09-16 NOTE — Medication Information (Signed)
Summary: Metoprolol Succinate Approved  Metoprolol Succinate Approved   Imported By: Maryln Gottron 05/15/2010 09:34:19  _____________________________________________________________________  External Attachment:    Type:   Image     Comment:   External Document

## 2010-09-16 NOTE — Progress Notes (Signed)
Summary: change Nexium  Phone Note Call from Patient   Caller: Patient Call For: Birdie Sons MD Summary of Call: [phone] Dr. Leone Payor put pt on Nexium and she wants to be change to something else.  Does not like Nexium. [facility] Initial call taken by: Lynann Beaver CMA,  May 16, 2010 10:00 AM  Follow-up for Phone Call        omeperazole 20 mg by mouth once daily  Follow-up by: Birdie Sons MD,  May 16, 2010 2:10 PM    New/Updated Medications: OMEPRAZOLE 20 MG CPDR (OMEPRAZOLE) one by mouth daily Prescriptions: OMEPRAZOLE 20 MG CPDR (OMEPRAZOLE) one by mouth daily  #30 x 5   Entered by:   Lynann Beaver CMA   Authorized by:   Birdie Sons MD   Signed by:   Lynann Beaver CMA on 05/16/2010   Method used:   [facility] to        [facility]  [phone]* (retail)       [address] [place], [place]  [address]       Ph: [phone] or [phone]       Fax: [phone]   RxID:   [id number]

## 2010-09-16 NOTE — Procedures (Signed)
Summary: Upper Endoscopy  Patient: [name] Note: All result statuses are Final unless otherwise noted.  Tests: (1) Upper Endoscopy (EGD)   EGD Upper Endoscopy       DONE     [facility]     [address]     [place], [place]  [address]           ENDOSCOPY PROCEDURE REPORT           PATIENT:  Destiny Dennis, Destiny Dennis  MR#:  [id number]     BIRTHDATE:  May 07, 1961, 48 yrs. old  GENDER:  female           ENDOSCOPIST:  Iva Boop, MD, [facility]     Referred by:  Birdie Sons, M.D.           PROCEDURE DATE:  01/30/2010     PROCEDURE:  EGD with biopsy     ASA CLASS:  Class II     INDICATIONS:  GERD Chronic recurrent problems with heartburn and     reflux. Recent flare with ENT symptoms (sinus, ear pressure). On     Nexium x 6 weeks now, better but not resolved.           MEDICATIONS:   Fentanyl 50 mcg, Versed 7 mg IV     TOPICAL ANESTHETIC:  Exactacain Spray           DESCRIPTION OF PROCEDURE:   After the risks benefits and     alternatives of the procedure were thoroughly explained, informed     consent was obtained.  The LB GIF-H180 [id number] endoscope was     introduced through the mouth and advanced to the second portion of     the duodenum, without limitations.  The instrument was slowly     withdrawn as the mucosa was fully examined.     <<PROCEDUREIMAGES>>           A hiatal hernia was found. It was 3 cm in size. 36-39 cm, sliding.     There were multiple polyps identified. in the body of the stomach.     Soft and fleshy, up to 8-10 mm maximum. 20-30 total. Multiple     biopsies were obtained and sent to pathology.  Otherwise the     examination was normal.    Retroflexed views revealed Retroflexion     exam demonstrated findings as previously described.    The scope     was then withdrawn from the patient and the procedure completed.           COMPLICATIONS:  None           ENDOSCOPIC IMPRESSION:     1) 3 cm hiatal hernia     2) Polyps, multiple in the body of  the stomach - look like     benign fundic gland polyps and these are not causing symptoms     3) Otherwise normal examination     RECOMMENDATIONS:     1) Stay on Nexium 40 mg each AM, take 30-60 minutes before     breakfast). It was refilled today for 3 more months.     2) Reduce caffeine, lose weight and avoid GERD (food) triggers.     Instructions provided.     3) If, after 3 months total therapy she still has problems then     she should see me again, otherwise follow-up with Dr. Cato Mulligan and     continue PPI - adjusting if lifestyle changes  allow improvement.     4) Will follow-up gastric polyp biopsies.           REPEAT EXAM:  In for as needed.           Iva Boop, MD, [facility]           CC:  Lindley Magnus, MD     The Patient           n.     Rosalie Doctor:   Iva Boop at 01/30/2010 10:31 AM           Rodman Pickle, [id number]  Note: An exclamation mark (!) indicates a result that was not dispersed into the flowsheet. Document Creation Date: 01/30/2010 10:32 AM _______________________________________________________________________  (1) Order result status: Final Collection or observation date-time: 01/30/2010 10:15 Requested date-time:  Receipt date-time:  Reported date-time:  Referring Physician:   Ordering Physician: Stan Head [phone]) Specimen Source:  Source: Launa Grill Order Number: [id number] Lab site:

## 2010-09-16 NOTE — Miscellaneous (Signed)
Summary: [facility]  Clinical Lists Changes  Observations: Added new observation of NKA: T (01/16/2010 14:31)

## 2010-09-19 NOTE — Letter (Signed)
Summary: [facility]  [facility]   Imported By: Maryln Gottron 11/26/2009 13:08:22  _____________________________________________________________________  External Attachment:    Type:   Image     Comment:   External Document

## 2010-09-24 NOTE — Assessment & Plan Note (Signed)
Summary: ST // RS   Vital Signs:  Patient profile:   50 year old female Weight:      245 pounds Temp:     97.9 degrees F Pulse rate:   82 / minute BP sitting:   120 / 80  (left arm)  Vitals Entered By: Kyung Rudd, CMA (September 15, 2010 11:06 AM) CC: pt c/o ST and HA x 1 wk...both children have been dx with strep recently   CC:  pt c/o ST and HA x 1 wk...both children have been dx with strep recently.  History of Present Illness: Patient presents to clinic as a workin for evaluation of ST. Notes 1wk h/o ST, HA and malaise. +strep exposure with 2 children at home diagnosed and being treated for strep. Took 1gm amox for dental appt and noted improvement x1day. Since then however ST pain has worsened.  No other alleviating or exacerbating factors.  Current Medications (verified): 1)  Metoprolol Succinate 50 Mg Xr24h-Tab (Metoprolol Succinate) .... Take One Tablet By Mouth Daily 2)  Aspirin 81 Mg  Tabs (Aspirin) .Marland Kitchen.. 1 Tab By Mouth Once Daily 3)  Xyzal 5 Mg Tabs (Levocetirizine Dihydrochloride) .... Once Daily 4)  Fluticasone Propionate 50 Mcg/act  Susp (Fluticasone Propionate) .... 2 Sprays Each Nostril Once Daily 5)  Prometrium 100 Mg Caps (Progesterone Micronized) .... Take 1 Capsule By Mouth At Bedtime 6)  Diazepam 5 Mg Tabs (Diazepam) .... As Needed Neck Spasms 7)  Omeprazole 20 Mg Cpdr (Omeprazole) .... One By Mouth Daily  Allergies (verified): No Known Drug Allergies  Past History:  Past medical, surgical, family and social histories (including risk factors) reviewed for relevance to current acute and chronic problems.  Past Medical History: Reviewed history from 12/23/2009 and no changes required. Osteoarthritis 1. Possible MRSA infection near operative wound in May of 2008 2. History of paroxysmal atrial fibrillation. 3. History of premature ventricular contractions. 4. Hyperlipidemia.  cervical disk disease Headache Allergic rhinitis GERD  Past Surgical  History: Reviewed history from 03/07/2007 and no changes required. Six arthroscopic surgeries on left knee Laparoscopic cholecystectomy in May of 2008  Family History: Reviewed history from 03/07/2007 and no changes required. Mom and Dad at 28 and 52 healthy  Social History: Reviewed history from 03/07/2007 and no changes required. Works in Dietitian, orthopaedic, neurosurgeon offices Married Never Smoked Alcohol use-no Drug use-no Regular exercise with bike  Review of Systems General:  Complains of fatigue and malaise; denies chills, fever, and sweats. Eyes:  Denies eye irritation, eye pain, and red eye. ENT:  Complains of sore throat; denies ear discharge, earache, hoarseness, and sinus pressure. Resp:  Denies cough and sputum productive.  Physical Exam  General:  Well-developed,well-nourished,in no acute distress; alert,appropriate and cooperative throughout examination Head:  Normocephalic and atraumatic without obvious abnormalities. No apparent alopecia or balding. Eyes:  pupils equal, pupils round, corneas and lenses clear, and no injection.   Ears:  External ear exam shows no significant lesions or deformities.  Otoscopic examination reveals clear canals, tympanic membranes are intact bilaterally without bulging, retraction, inflammation or discharge. Hearing is grossly normal bilaterally. Nose:  External nasal examination shows no deformity or inflammation. Nasal mucosa are pink and moist without lesions or exudates. Mouth:  no exudates, no posterior lymphoid hypertrophy, no postnasal drip, and no lesions.  +posterior erythema Neck:  No deformities, masses, or tenderness noted.   Impression & Recommendations:  Problem # 1:  SORE THROAT (ICD-462) Assessment New Rapid strep neg however given exam findings and  confirmed strep exposure recommend empiric treatment. Begin abx. Followup if no improvement or worsening.  Her updated medication list for this problem  includes:    Aspirin 81 Mg Tabs (Aspirin) .Marland Kitchen... 1 tab by mouth once daily    Amoxicillin 875 Mg Tabs (Amoxicillin) ..... One by mouth bid  Complete Medication List: 1)  Metoprolol Succinate 50 Mg Xr24h-tab (Metoprolol succinate) .... Take one tablet by mouth daily 2)  Aspirin 81 Mg Tabs (Aspirin) .Marland Kitchen.. 1 tab by mouth once daily 3)  Xyzal 5 Mg Tabs (Levocetirizine dihydrochloride) .... Once daily 4)  Fluticasone Propionate 50 Mcg/act Susp (Fluticasone propionate) .... 2 sprays each nostril once daily 5)  Prometrium 100 Mg Caps (Progesterone micronized) .... Take 1 capsule by mouth at bedtime 6)  Diazepam 5 Mg Tabs (Diazepam) .... As needed neck spasms 7)  Omeprazole 20 Mg Cpdr (Omeprazole) .... One by mouth daily 8)  Amoxicillin 875 Mg Tabs (Amoxicillin) .... One by mouth bid  Other Orders: Rapid Strep (44010) Prescriptions: AMOXICILLIN 875 MG TABS (AMOXICILLIN) one by mouth bid  #20 x 0   Entered and Authorized by:   Edwyna Perfect MD   Signed by:   Edwyna Perfect MD on 09/15/2010   Method used:   Electronically to        [facility]  [phone]* (retail)       [address] [place], [place]  [address]       Ph: [phone] or [phone]       Fax: [phone]   RxID:   [id number]    Orders Added: 1)  Rapid Strep [20254] 2)  Est. Patient Level III [27062]    Laboratory Results    Other Tests  Rapid Strep: negative

## 2010-10-14 ENCOUNTER — Ambulatory Visit (INDEPENDENT_AMBULATORY_CARE_PROVIDER_SITE_OTHER): Payer: BC Managed Care – PPO | Admitting: Internal Medicine

## 2010-10-14 ENCOUNTER — Encounter: Payer: Self-pay | Admitting: Internal Medicine

## 2010-10-14 VITALS — BP 122/74 | Temp 98.7°F

## 2010-10-14 DIAGNOSIS — J209 Acute bronchitis, unspecified: Secondary | ICD-10-CM

## 2010-10-14 MED ORDER — AZITHROMYCIN 500 MG PO TABS: ORAL_TABLET | ORAL | Status: AC

## 2010-10-14 NOTE — Progress Notes (Signed)
  Subjective:    Patient ID: Destiny Dennis, female    DOB: [birth year], 50 y.o.   MRN: [id number]  HPI  Reviewed dr Rodena Medin note from 1/30. Since then has had persistent , unrelenting cough. Mostly nonproductive. No fever or chills. No sweats.  no real shortness of breath no wheezing.  Past Medical History  Diagnosis Date  . Atrial fibrillation 03/05/2007  . 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 GERD 12/23/2009  . Sialoadenitis 03/18/2010  . Hyperlipidemia   . Headache    Past Surgical History  Procedure Date  . Knee arthroscopy     x6 left knee  . Cholecystectomy 5/08    laparoscopic    reports that she has never smoked. She does not have any smokeless tobacco history on file. She reports that she does not drink alcohol. Her drug history not on file.     .  Review of Systems  patient denies chest pain, shortness of breath, orthopnea. Denies lower extremity edema, abdominal pain, change in appetite, change in bowel movements. Patient denies rashes, musculoskeletal complaints. No other specific complaints in a complete review of systems.      Objective:   Physical Exam  Well-developed well-nourished female in no acute distress. HEENT exam atraumatic, normocephalic, extraocular muscles are intact. Neck is supple. No jugular venous distention no thyromegaly. Chest clear to auscultation without increased work of breathing. Cardiac exam S1 and S2 are regular. Abdominal exam active bowel sounds, soft, nontender. Extremities no edema. Neurologic exam she is alert without any motor sensory deficits. Gait is normal.        Assessment & Plan:   She likely has an upper respiratory infection. However, given the duration of symptoms I think is worth a trial of antibiotics.see Medications. Side effects discussed. She will come in next week if symptoms are not clearly improving.

## 2010-10-24 ENCOUNTER — Other Ambulatory Visit: Payer: Self-pay | Admitting: Internal Medicine

## 2010-12-30 NOTE — Assessment & Plan Note (Signed)
[facility]                            CARDIOLOGY OFFICE NOTE   VALETTA, MULROY                     MRN:          [id number]  DATE:08/29/2008                            DOB:          11-15-1960    REFERRING PHYSICIAN:  John L. Masonis, MD   REASON FOR CONSULTATION:  Preop evaluation prior to knee surgery.   CLINICAL HISTORY:  Destiny Dennis is 50 year old and is seen today for  preop evaluation prior to knee surgery.  She had a total knee  replacement in August 2008, but has not done well since the surgery with  repeated swelling and a feeling of dislocation to the knee, which has  prevented normal ambulation.  She has seen Dr. Dennard Schaumann in [place] and  is scheduled for redo knee surgery in early February.   Malillany has a history of paroxysmal atrial fibrillation in the past.  This happened a number of years ago.  She has been evaluated with an  echocardiogram, which has been normal.  I saw her for preop evaluation  prior to her total knee replacement in July 2008 and we did an  echocardiogram at that time, which was normal and showed an ejection  fraction of 55-60%.  Recently, she has had some symptoms of skipping,  which had been documented as PVCs in the past.  She got some blood work,  which showed a low potassium 3.2 and she has been on potassium  supplement and also some hormone supplements since that time, and her  symptoms have become quiescent.  She has had no recent chest pain or  shortness of breath.   Her past medical history is significant for an elevated LDL,  cholesterol.  She also has a history of gallbladder surgery.   Her current medications include only p.r.n. hydrocodone and Prevacid.   SOCIAL HISTORY:  She lives in [place] here with her husband.  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  She does  not smoke.  She does have children.   Her family history is positive for atrial  fibrillation and that her  mother has atrial fibrillation and she has a brother who is a  International aid/development worker  in [employer] with atrial fibrillation.   REVIEW OF SYSTEMS:  Positive for her left knee pain.   PHYSICAL EXAMINATION:  VITAL SIGNS:  The blood pressure is 140/82 and  the pulse 98 and regular.  NECK:  There was no venous tension.  Carotid pulses were full without  bruits.  CHEST:  Clear without rales or rhonchi.  CARDIAC:  Rhythm was regular.  Heart sounds were normal.  I could hear  no murmurs, gallops, or clicks.  ABDOMEN:  Soft with normal bowel sounds.  There is no  hepatosplenomegaly.  EXTREMITIES:  There is no peripheral edema.  Pedal pulses were equal.  MUSCULOSKELETAL:  No deformities.  SKIN:  Warm and dry.  NEUROLOGIC:  No focal neurological signs.   Electrocardiogram was normal.   IMPRESSION:  1. Preoperative evaluation prior to a redo knee surgery after prior  total knee replacement August 2008.  2. History of paroxysmal atrial fibrillation.  3. History of premature ventricular contractions.  4. Hyperlipidemia.   RECOMMENDATIONS:  I think Ms. Milillo's cardiac risk for a knee surgery  should be quite low.  She has no evidence of vascular disease and her  risk profile is not particularly high.  Her biggest risk is probably  going into atrial fibrillation postoperatively, but this should be able  to be managed well.  I will plan to get BNP and magnesium level to make  sure electrolytes are in balance since she has increased her potassium  intake.  If these are okay, we will clear her for knee surgery under  general anesthesia.     Bruce Elvera Lennox Juanda Chance, MD, [facility]  Electronically Signed    BRB/MedQ  DD: 08/29/2008  DT: 08/30/2008  Job #: [id number]   cc:   Fax# [phone] Dr. Elmo Putt, MD

## 2010-12-30 NOTE — Op Note (Signed)
NAME:  Destiny Dennis, Destiny Dennis              ACCOUNT NO.:  [id number]   MEDICAL RECORD NO.:  [id number]          PATIENT TYPE:  INP   LOCATION:  1602                         FACILITY:  [facility]   PHYSICIAN:  Erasmo Leventhal, M.D.DATE OF BIRTH:  07-26-1961   DATE OF PROCEDURE:  03/25/2007  DATE OF DISCHARGE:                               OPERATIVE REPORT   PREOPERATIVE DIAGNOSIS:  Left knee end stage osteoarthritis.   POSTOPERATIVE DIAGNOSIS:  Left knee end stage osteoarthritis.   PROCEDURE:  Left total knee arthroplasty.   SURGEON:  Erasmo Leventhal, M.D.   FIRST ASSISTANT:  Madlyn Frankel. Charlann Boxer, M.D.   SECOND ASSISTANT:  Jaquelyn Bitter. Chabon, P.A.-C.   ANESTHESIA:  Spinal Duramorph with monitored anesthesia care, Dr.  Lestine Box.   ESTIMATED BLOOD LOSS:  Less than 100 mL.   DRAINS:  One medium Hemovac.   COMPLICATIONS:  None.   TOURNIQUET TIME:  1 hour 25 minutes at 350 mmHg.   DISPOSITION:  PACU stable.   PROCEDURE IN DETAIL:  The patient and family were counseled in the  holding area, correct side was identified.  IV had been started.  She  was taken to the operating room where spinal anesthetic was  administered.  A Foley catheter was placed utilizing sterile technique  by the OR circulating nurse.  Preoperatively, she had been given a gram  of Ancef.  However, due to her history of previous infection, I also  recommended 1000 mg of vancomycin.  The left lower extremity was  elevated.  She was prepped with DuraPrep.  She had a small abrasion on  her toe which was nice and clean and healing well.  It was isolated with  an [place].  She was then draped in a sterile fashion.  The leg was  exsanguinated with an Esmarch, and the tourniquet inflated to 350 mmHg.  This was based upon the size of her thigh and blood pressure.   A straight midline incision was made through the skin and subcutaneous  tissue.  Medial and lateral soft tissue flaps were developed.  The knee  was  flexed.  Medial parapatellar arthrotomy was performed. A very slight  proximal medial soft tissue release was done just to visualize  proximally the tibia.  The patella was retracted out of the way but not  everted.  The cruciate ligaments were resected.  She had end stage  arthritic changes of the knee.  The starting hole was made in the distal  femur, the canal was irrigated until the effluent was clear,  intramedullary rod was gently placed.  We chose a 5 degree valgus cut  and took a 9 mm cut off the distal femur due to the fact she had about a  3 degree recurvatum deformity prior to her surgery.  The distal femur  was found to be a size 3, rotation marks were set, pin block was  applied, and it was cut to fit a size 3.  All osteophytes were removed.  Medial and lateral menisci were removed under direct visualization.  The  geniculate vessels were coagulated and thought of  throughout the entire  case.   The tibial eminence was resected.  The proximal tibia was found to be a  size 3, rotational marks were made, reamer.  Reaming was performed with  the step reamer, irrigated, canal was vented and an intramedullary rod  was gently placed.  We chose a 0 degree slope and took a 2 mm cut based  upon the least deficient side.  At this time with flexion and extension  blocks with a 10 mm insert, we had well balanced gaps.  The tibial  baseplate was applied, rotation coverage was set, reamer, punch was  performed in a standard fashion.  The femoral box cut was performed.  At  this time, a size 3 femur, size 3 tibia, with a 10 insert, we had full  extension, stable to varus and valgus stress 0 to 90 degrees, well  balanced flexion and extension gaps.  The patella was found to be a size  38, the appropriate amount of bone was resected, locking holes were  made, and the patellar button tracked anatomically.   All trials were now removed.  The cement was mixed.  Utilizing Moder and  Katrinka Blazing  technique, all components were cemented into place, size 3 tibia,  size 3 femur, 38 patella, with a 10 trial insert.  After the cement had  cured, the knee was again checked  with a 10 mm insert, it was well  balanced in flexion and extension, patellofemoral tracking anatomically.  The trial was removed, excess cement was removed, the knee was again  irrigated, and the final 10 mm posterior stabilizing rotating platform  insert was implanted.  The components used were the DePuy Sigma  posterior stabilized rotating platform system.  A medium Hemovac drain  was placed.  Sequential closure of the layers were.  The arthrotomy was  closed in 90 degrees of flexion with Vicryl, subcu Vicryl, the skin was  closed with subcuticular Monocryl suture.  Steri-Strips were applied.  The drain was hooked to suction.  A sterile dressing was applied.  The  tourniquet was deflated.  Normal circulation to the foot and ankle at  the end of the case.  No complications or problems.  She was then gently  taken from the operating room to the PACU in stable condition with a  knee immobilizer and ice pack.  Sponge and needle counts were correct.  No complications or problems.   To help with surgical technique and decision making, Dr. Nilsa Nutting  assistance was needed throughout the entirety of the case.           ______________________________  Erasmo Leventhal, M.D.     RAC/MEDQ  D:  03/25/2007  T:  03/26/2007  Job:  [id number]

## 2010-12-30 NOTE — H&P (Signed)
NAME:  Destiny Dennis, Destiny Dennis              ACCOUNT NO.:  [id number]   MEDICAL RECORD NO.:  [id number]         PATIENT TYPE:  LINP   LOCATION:                               [facility]   PHYSICIAN:  Erasmo Leventhal, M.D.DATE OF BIRTH:  1960-10-24   DATE OF ADMISSION:  03/25/2007  DATE OF DISCHARGE:                              HISTORY & PHYSICAL   DATE OF SURGERY:  March 25, 2007   ADMITTING DIAGNOSIS:  End-stage osteoarthritis left knee.   HISTORY OF PRESENT ILLNESS:  This is a 50 year old lady with a long  history of problems with the left knee with multiple arthroscopies and  debridement who has developed end-stage osteoarthritis and failed  conservative management of cortisone injections, viscosupplementation  and therapy, and after discussion of treatments, benefits, risks and  options is now scheduled for total knee arthroplasty of the left knee.  She understands the increased risk of stiffness at her young age and the  possibility of revision arthroplasty in the future due to total knee at  such a young age.  She understands these risks and wishes to proceed,  and surgery will go ahead as scheduled.  She will be bringing medical  clearance from her cardiologist, Dr. Juanda Chance, and medical clearance from  her medical doctor.   PAST MEDICAL HISTORY:  Drug allergies:  None.   Current medications:  Occasional Prilosec.   Previous surgeries:  Left knee multiple arthroscopies, right shoulder,  right elbow, right foot, cervical fusion, left wrist, C-section x2 and  cholecystectomy.   Serious medical illnesses include reflux and a history of atrial  fibrillation, one episode after a miscarriage.   FAMILY HISTORY:  Positive for osteoarthritis.   SOCIAL HISTORY:  The patient is married.  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  She does not smoke and does not drink.   REVIEW OF SYSTEMS:  CENTRAL NERVOUS SYSTEM:  Positive for migraine  headaches.  Negative for blurred  vision or dizziness.  PULMONARY:  Positive for a history of bronchitis.  Negative for shortness of breath,  PND and orthopnea.  CARDIOVASCULAR:  Positive for a history of atrial  fibrillation x1 at a miscarriage years ago.  Otherwise, no chest pain or  shortness of breath.  GI:  Positive for occasional bouts of reflux.  GU:  Negative.   PHYSICAL EXAMINATION:  Reveals a well-developed, well-nourished lady in  no acute distress.  BP 158/88, respirations 18, pulse 82 and regular.  GENERAL APPEARANCE:  There is a well-developed, well-nourished lady in  no acute distress.  HEENT:  Head normocephalic.  Nose patent, ears patent.  Pupils equal,  round, reactive to light.  Throat without injection.  NECK:  Supple without adenopathy.  Carotids 2+ without bruit.  CHEST:  Clear to auscultation.  No rales or rhonchi.  Respirations 18.  HEART:  Regular rate and rhythm at 82 beats per without murmur.  ABDOMEN:  Soft with active bowel sounds.  No masses or organomegaly.  NEUROLOGIC:  The patient alert and oriented to time, place and person.  Cranial nerves II-XII grossly intact.  EXTREMITIES:  Shows the left knee with 0-140 degree range of motion,  normal alignment, significant crepitation throughout range of motion  with 1+ effusion.  Neurovascular status is intact and dorsalis pedis and  posterior pulses are 2+.   X-rays show end-stage osteoarthritis left knee.   IMPRESSION:  End-stage osteoarthritis left knee.   PLAN:  Total knee arthroplasty left knee.      Jaquelyn Bitter. Chabon, P.A.    ______________________________  Erasmo Leventhal, M.D.    SJC/MEDQ  D:  02/28/2007  T:  02/28/2007  Job:  [id number]

## 2010-12-30 NOTE — Discharge Summary (Signed)
NAME:  Destiny Dennis, Destiny Dennis              ACCOUNT NO.:  [id number]   MEDICAL RECORD NO.:  [id number]          PATIENT TYPE:  INP   LOCATION:  1602                         FACILITY:  [facility]   PHYSICIAN:  Erasmo Leventhal, M.D.DATE OF BIRTH:  Jan 12, 1961   DATE OF ADMISSION:  03/25/2007  DATE OF DISCHARGE:  03/29/2007                               DISCHARGE SUMMARY   ADMISSION DIAGNOSIS:  End-stage osteoarthritis left knee.   DISCHARGE DIAGNOSIS:  End-stage osteoarthritis left knee.   OPERATIONS:  Total knee arthroplasty left knee.   BRIEF HISTORY:  This is a 50 year old lady with history of end-stage  osteoarthritis that has failed conservative treatment to manage her pain  and discomfort. After discussion of treatments,  benefits, risks and  options, the patient now scheduled for total knee arthroplasty of left  knee.   LABORATORY VALUES:  Admission CBC within normal limits.  Admission BMET  within normal limits.  She had a slightly low potassium.  This was  corrected with oral supplementation. It was down to 3.1 back up to 3.4  at admission.  Her hemoglobin/hematocrit reached a low of 9.7 and 27.2  on the 11th.   COURSE IN THE HOSPITAL:  The patient tolerated the procedure well.  First postoperative day, vital signs remained stable.  She had mild  temperature.  Her lungs were clear.  Heart sounds normal.  Bowel sounds  active.  Dressing was dry.  Drain was removed without difficulty.  She  had moderate pain and was switched over to Dilaudid PCA.  Second  postoperative day, vital signs stable.  Again low grade temperature  under 101.  She was treated with a increased incentive spirometry, cough  and deep breathe.  Her hemoglobin/hematocrit remained in acceptable  range.  She had slight hypokalemia and was treated with oral potassium  supplementation.  Lungs clear.  Heart sounds normal.  Bowel sounds  sluggish.  Calves were negative.  Dressing was changed.  Wound was  benign.  She  continued to have significant discomfort and continued on  the PCA that day.  Third postoperative day vital signs were stable.  She  continued with low grade fever.  Her lungs clear.  Heart sounds normal.  Bowel sounds sluggish.  Calves were negative.  Wound was benign.  Her  potassium had dropped little further to 3.1 and she was given further  oral potassium supplementation.  There was no evidence of infection.  No  urinary tract symptoms.  She continued with cough and deep breathe.  On  the fourth postoperative day her potassium and corrected up to 3.4  otherwise a BMET was normal with exception of slightly high glucose.  Her hemoglobin and hematocrit were 9.7 and 27.2 a normal white count on  the 11th.  On the fourth postoperative day she was on Dilaudid 2 mg 1-2  q.4-6h p.r.n. pain and Dr. Charlann Boxer had added 20 mg of OxyContin b.i.d.  p.r.n. for breakthrough pain.  She continued to have achy discomfort in  her knee and some tenderness of her thigh where the tourniquet had been.  The thigh was soft, the  calves were negative.  The wound was benign.  There was no effusion.  Lungs were clear.  Heart sounds normal.  Bowel  sounds sluggish.  Neurovascular status intact in the leg.  We had a long  discussion about her pain.  She had been taking significant narcotics  prior to surgery for her previous neck surgery and pain in her knee and  therefore her tolerance of pain medicine was increased.  We discussed  the need to use as little pain medication as possible to avoid problems  with constipation, nausea, loss of appetite.  We discussed her going  home and she decided that would that would be the best thing for her, to  get back in her normal environment and routine and we discussed home  physical therapy home health.  And she was subsequently discharged home  for follow-up in the office.   CONDITION ON DISCHARGE:  Improved.   DISCHARGE MEDICATIONS:  1. Dilaudid 2 mg #80 one to two q.4-6 h  p.r.n. pain  2. OxyContin 10 mg one q.12 h p.r.n. severe pain only.  3. She is to use her Valium at home only on a p.r.n. basis not on a      scheduled basis.  4. Trinsicon one b.i.d. for anemia  5. Lovenox 30 mg subcu b.i.d. for a total of 10 days postop.   DISCHARGE INSTRUCTIONS:  She is instructed to keep the wound clean and  dry, to elevate and ice her knee, do her home therapy, do her CPM and  call the office if problems or questions arise.      Destiny Dennis, P.A.    ______________________________  Erasmo Leventhal, M.D.    SJC/MEDQ  D:  03/29/2007  T:  03/30/2007  Job:  [id number]

## 2010-12-30 NOTE — Assessment & Plan Note (Signed)
[facility]                            CARDIOLOGY OFFICE NOTE   Destiny, Dennis                     MRN:          [id number]  DATE:02/17/2007                            DOB:          [birth year]    REFERRING PHYSICIAN:  Erasmo Leventhal, M.D.   PRIMARY CARE PHYSICIAN:  Destiny Mole. Swords, MD   REASON FOR CONSULTATION:  Preoperative evaluation prior to total knee  replacement.   CLINICAL HISTORY:  Destiny Dennis is 50 years old and has severe  degenerative disease of her left knee and is scheduled for total knee  replacement in August. She is here today for preoperative evaluation.   She has a history of paroxysmal atrial fibrillation in the past for  which she was evaluated with an echocardiogram in August of 2006, at  which time her ejection fraction was 55-60%. She has no valvular  lesions.   She has been doing well and has had none of the episodes that she has  associated with atrial fibrillation, although she has had some different  palpitations. She describes this as a feeling of a heart missing. This  will sometimes come on with more strenuous activities, but has not been  frequent recently. She has been evaluated with a Holter monitor in the  past which has shown isolated PVCs.   PAST MEDICAL HISTORY:  1. Elevated cholesterol with an LDL of 136, which has been managed      with diet.  2. Recent gallbladder surgery earlier this year which was done      laparoscopically.   CURRENT MEDICATIONS:  Only p.r.n. hydrocodone.   SOCIAL HISTORY:  She lives in [place] with her husband. 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 She does not  smoke. She does have children.   FAMILY HISTORY:  Her mother has atrial fibrillation and her father is  healthy. She also has a brother with atrial fibrillation. He is a  cardiologist in [employer].   REVIEW OF SYSTEMS:  Is positive for her left knee pain.   PHYSICAL  EXAMINATION:  Blood pressure 161/91, pulse 90 and regular. (Her  blood pressures have been normal on her own readings at other times.)  There was no venous distention. The carotid pulses were full and there  were no bruits.  CHEST: Was clear without rales or rhonchi.  CARDIAC: Rhythm was regular. The first and second heart sounds were  normal. I could hear no murmurs, gallops or clicks.  ABDOMEN: Was soft with normal bowel sounds. There were recent scars from  her gallbladder surgery. There is no hepatosplenomegaly.  The peripheral pulses were full and there was no peripheral edema.  MUSCULOSKELETAL: System showed no deformities.  SKIN: Was warm and dry.  NEUROLOGIC: Examination showed no focal neurological signs.   ELECTROCARDIOGRAM: Showed nonspecific ST-T changes.   IMPRESSION:  1. Preoperative evaluation prior to total knee replacement.  2. History of paroxysmal atrial fibrillation.  3. History of PVCs and palpitations.  4. No evidence of structural heart disease by prior echocardiogram.  5. Hyperlipidemia.   RECOMMENDATIONS:  It has been two years since [place] had an  echocardiogram and I think we should repeat this to reevaluate her LV  function. I think she should be low risk from a cardiac standpoint for a  total knee replacement. Will be available if she has any problems.     Bruce Elvera Lennox Juanda Chance, MD, [facility]  Electronically Signed    BRB/MedQ  DD: 02/17/2007  DT: 02/17/2007  Job #: [id number]   cc:   Erasmo Leventhal, M.D.  Bruce Rexene Edison Swords, MD

## 2011-01-02 NOTE — H&P (Signed)
NAME:  Destiny Dennis, Destiny Dennis                        ACCOUNT NO.:  [id number]   MEDICAL RECORD NO.:  [id number]                   PATIENT TYPE:  INP   LOCATION:  NA                                   FACILITY:  [facility]   PHYSICIAN:  Janine Limbo, M.D.            DATE OF BIRTH:  December 10, 1960   DATE OF ADMISSION:  DATE OF DISCHARGE:                                HISTORY & PHYSICAL   ANTICIPATED DATE OF ADMISSION:  November 19, 2003   HISTORY OF PRESENT ILLNESS:  Ms. Salomone is a 50 year old female gravida 4,  para 1-0-2-1, who presents at [redacted] weeks gestation ([facility] November 29, 2003) for  repeat cesarean section and tubal ligation.  The patient has been followed  at the [facility]cology [facility] of [facility] for this pregnancy that has been complicated the fact  that she is older than age 71.  The patient does have a history of an infant  with trisomy 42.  An amniocentesis was performed and the patient was found  to have normal chromosomes.   OBSTETRICAL HISTORY:  In March of 2000, the patient had a cesarean section  at 39-1/[redacted] weeks gestation where she delivered a 9 pound 5 ounce female infant.  She had a nonreassuring fetal heart rate tracing and the patient failed to  progress during her labor.  In April of 2002, the patient had an elected  pregnancy termination in the second trimester because of an infant with  trisomy 85.  In April of 2004, the patient had a first trimester  miscarriage.   DRUG ALLERGIES:  No known drug allergies.   PAST MEDICAL HISTORY:  The patient denies hypertension and diabetes.  She  does have a history of atrial fibrillation that is now well controlled.  The  patient had surgery five different times on her left knee.  She had surgery  on her right elbow and her right shoulder.  She had surgery on her left eye  in 1993.  She had a tonsillectomy in 1982.  She had surgery on her sinuses  in 1991.  The patient had a dilatation  and evacuation in 2002 because of her  miscarriage.  The patient was born without a gallbladder.   SOCIAL HISTORY:  The patient denies cigarette use, alcohol use, and  recreational drug use.   REVIEW OF SYSTEMS:  Normal pregnancy complaints.   FAMILY HISTORY:  The patient's mother was born without a gallbladder.   PHYSICAL EXAMINATION:  VITAL SIGNS:  Weight is 250 pounds.  HEENT:  Within normal limits.  CHEST:  Clear.  HEART:  Regular rate and rhythm.  BREASTS:  Without masses.  ABDOMEN:  Gravid with a fundal height of 40 cm.  EXTREMITIES:  Within normal limits.  NEUROLOGICAL:  Grossly normal.  PELVIC:  Cervix was closed and long when last checked.   LABORATORY VALUES:  Blood type is O  positive, antibody screen negative, VDRL  nonreactive, rubella immune, HBsAg negative, HIV nonreactive.  GC negative,  Chlamydia negative, Pap within normal limits.  Third trimester beta strep is  negative, third trimester gonorrhea is negative, and third trimester  Chlamydia is negative.   ASSESSMENT:  1. At [redacted] weeks gestation.  2. Prior cesarean section.  3. Desires repeat cesarean section and tubal ligation.   PLAN:  The patient will undergo a repeat low transverse cesarean section and  tubal ligation.  She understands the indications for her procedure and she  accepts the risks of, but not limited to, anesthetic complications,  bleeding, infections, possible damage to the surrounding organs, and  possible tubal failure.  The tubal failure rate was quoted to be 17:1000.                                               Janine Limbo, M.D.    AVS/MEDQ  D:  11/15/2003  T:  11/15/2003  Job:  [id number]

## 2011-01-02 NOTE — Discharge Summary (Signed)
NAME:  Destiny Dennis, Destiny Dennis                        ACCOUNT NO.:  [id number]   MEDICAL RECORD NO.:  [id number]                   PATIENT TYPE:  INP   LOCATION:  9120                                 FACILITY:  [facility]   PHYSICIAN:  Naima A. Dillard, M.D.              DATE OF BIRTH:  [birth year]   DATE OF ADMISSION:  11/19/2003  DATE OF DISCHARGE:                                 DISCHARGE SUMMARY   ADMISSION DIAGNOSES:  1. Intrauterine pregnancy at term.  2. Previous cesarean section.  3. Desires repeat cesarean section.  4. Macrosomia.  5. Fibroid uterus.  6. Desires sterilization.   DISCHARGE DIAGNOSES:  1. Intrauterine pregnancy at term.  2. Previous cesarean section.  3. Desires repeat cesarean section.  4. Macrosomia.  5. Fibroid uterus.  6. Desires sterilization.  7. Breastfeeding.   PROCEDURES THIS ADMISSION:  Repeat low transverse cesarean section for  delivery of a viable female infant named Destiny Dennis attended in delivery by Dr.  Leonard Schwartz and Nigel Bridgeman, C.N.M. on November 19, 2003.   HOSPITAL COURSE:  Destiny Dennis is a 50 year old married white female gravida  3 para 1-0-1-1 at term admitted for repeat cesarean section with a suspected  macrosomia.  She also desired sterilization.  She underwent the repeat low  transverse cesarean section for delivery of a viable female infant named Destiny Dennis  who weighed 11 pounds 9 ounces and had Apgars of 9 and 10 on November 19, 2003  attended in delivery by Dr. Leonard Schwartz and Nigel Bridgeman, C.N.M.  Please see operative note for further details.  The patient also underwent  bilateral tubal ligation for sterilization at the time of cesarean section.  Postoperatively the patient has done well.  She is ambulating, voiding,  eating without difficulty.  Her vital signs have been stable and she has  been afebrile throughout her hospital course.  She is breastfeeding also  without difficulty and her pain is well controlled with p.o.  medications.  She is deemed ready for discharge today.   DISCHARGE INSTRUCTIONS:  As per the [facility] OB[facility] handout.   DISCHARGE MEDICATIONS:  1. Motrin 600 mg p.o. q.6h. p.r.n. for pain.  2. Tylox one to two p.o. q.4-6h. p.r.n. for pain.  3. Prenatal vitamins daily.   DISCHARGE LABORATORY DATA:  Her hemoglobin is 9.5, wbc count is 8.6, and  platelets are 176.   DISCHARGE FOLLOW-UP:  Her discharge follow-up will be in 6 weeks at [facility] or p.r.n.     Concha Pyo. Duplantis, C.N.M.              Naima A. Normand Sloop, M.D.    SJD/MEDQ  D:  11/23/2003  T:  11/23/2003  Job:  [id number]

## 2011-01-02 NOTE — Op Note (Signed)
NAME:  Destiny Dennis, Destiny Dennis              ACCOUNT NO.:  [id number]   MEDICAL RECORD NO.:  [id number]          PATIENT TYPE:  INP   LOCATION:  NA                           FACILITY:  [facility]   PHYSICIAN:  Sharolyn Douglas, M.D.        DATE OF BIRTH:  1961/03/20   DATE OF PROCEDURE:  02/25/2006  DATE OF DISCHARGE:                                 OPERATIVE REPORT   DIAGNOSIS:  Cervical spondylotic radiculopathy, C6-7.   PROCEDURE:  1.  Anterior cervical diskectomy, C6-7.  2.  Anterior cervical arthrodesis, C6-7, with placement of 6-mm allograft      prosthesis spacer packed with local autogenous bone graft.  3.  Anterior cervical plating, C6-7, using the Abbott spine system.   SURGEON:  Sharolyn Douglas, M.D.   ASSISTANT:  Verlin Fester, P.A.   ANESTHESIA:  General endotracheal.   ESTIMATED BLOOD LOSS:  Minimal.   COMPLICATIONS:  None.   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  Her MRI scan and plain radiographs show severe degenerative  changes at C6-7 with a focal disk osteophyte on the left.  She has failed to  respond to conservative treatment measures and now elects to undergo ACDF at  C6-7 in hopes of improving her symptoms.  Risks and benefits were reviewed.   PROCEDURE:  The patient was identified in holding area, taken to the  operating room, underwent general endotracheal anesthesia without  difficulty, given prophylactic IV antibiotics.  She was carefully positioned  supine with her neck in slight extension.  Five pounds halter traction  applied.  Neck prepped, draped in usual sterile fashion.  A small transverse  incision made level with the cricoid cartilage, left side of the neck.  Dissection was carried sharply through platysma in the interval between the  SCM and strap muscles, medially was developed down to the prevertebral  space.  The esophagus, trachea, and  carotid sheath were identified protected  at all times.  A spinal needle was placed into the C7-T1 disk space and an x-  ray taken to confirm this level.  We then worked up exposing the C6-7 level  by elevating the longus coli muscle.  Deep retractors were placed.  We then  completed a diskectomy back to the posterior longitudinal ligament.  Caspar  distraction pins were applied, and gentle distraction applied.  The  cartilaginous endplates were scraped clean.  High-speed bur used to take  down the uncovertebral joints as well as posterior vertebral margins.  This  bone was saved for later autografting.  We then used a 2-mm Kerrison punch  to take down the posterior longitudinal ligament and complete wide  foraminotomies.  On the left side, we found a large osteophyte which was  compressing into the lateral border of thecal sac.  This was undercut and  removed using microcurettes and Kerrisons.  A small disk herniation was also  removed from the left foramen.  The wound was irrigated.  Hemostasis was  achieved with FloSeal.  We then turned our attention to completing the  fusion using a 6-mm allograft prosthesis spacer which had been packed with  local bone graft from the drill.  This was countersunk 1 mm.  We then placed  a 22-mm anterior cervical plate with four 12-mm screws.  The screw purchase  was good.  We ensured the locking mechanism engaged.  Wound was irrigated.  The esophagus, trachea, and carotid sheath were examined.  There were no  apparent injuries.  Hemostasis was achieved.  The deep fascia closed with 2-  0 Vicryl suture, subcutaneous layer closed with 3-0 Vicryl followed by  Dermabond on the skin edges.  Sterile dressing applied.  Cervical collar  placed.  The patient was extubated without difficulty and transferred to  recovery in stable condition.  Intraoperative x-ray demonstrated appropriate  positioning of the instrumentation at C6-7.  It should be noted that my   assistant was present throughout the procedure including during positioning,  during the exposure, the decompression, the instrumentation and fusion and  also during wound closure.      Sharolyn Douglas, M.D.  Electronically Signed     MC/MEDQ  D:  02/25/2006  T:  02/25/2006  Job:  [id number]

## 2011-01-02 NOTE — Op Note (Signed)
NAME:  Destiny Dennis, Destiny Dennis                        ACCOUNT NO.:  [id number]   MEDICAL RECORD NO.:  [id number]                   PATIENT TYPE:  INP   LOCATION:  9120                                 FACILITY:  [facility]   PHYSICIAN:  Janine Limbo, M.D.            DATE OF BIRTH:  Jul 13, 1961   DATE OF PROCEDURE:  11/19/2003  DATE OF DISCHARGE:                                 OPERATIVE REPORT   PREOPERATIVE DIAGNOSES:  1. Term intrauterine pregnancy.  2. Prior cesarean section.  3. Desires repeat cesarean section.  4. Desires sterilization.   POSTOPERATIVE DIAGNOSES:  1. Term intrauterine pregnancy.  2. Prior cesarean section.  3. Desires repeat cesarean section.  4. Desires sterilization.  5. Macrosomia.  6. Fibroid uterus.   PROCEDURES:  1. Repeat low transverse cesarean section.  2. Bilateral tubal ligation.   SURGEON:  Janine Limbo, M.D.   FIRST ASSISTANT:  Renaldo Reel. Emilee Hero, C.N.M.   ANESTHESIA:  Spinal.   DISPOSITION:  Destiny Dennis is a 50 year old female, gravida 4, para 1-0-2-1,  who presents at term for repeat cesarean section.  She also desires  sterilization.  She understands the indications for her procedure and she  accepts the risks of, but not limited to, anesthetic complications,  bleeding, infection, possible damage to the surrounding organs, and possible  tubal failure (17 per 1000).   FINDINGS:  An 11 pound 9 ounce female infant Samuel Bouche) was delivered from a  cephalic presentation.  The Apgars were 9 at one minute and 10 at five  minutes.  There was a 1 cm anterior fundal fibroid present.  The fallopian  tubes and the ovaries were normal for the gravid state.   PROCEDURE:  The patient was taken to the operating room, where a spinal  anesthetic was given.  The patient's abdomen and perineum were prepped with  multiple layers of Betadine.  A Foley catheter was placed in the bladder.  The patient was sterilely draped.  The lower abdomen was injected with 20  mL  of 0.5% Marcaine with epinephrine.  A low transverse incision was made and  carried sharply through the subcutaneous tissue, the fascia, and the  anterior peritoneum.  An incision was made in the lower uterine segment, and  the bladder flap was developed.  The incision was extended in a low  transverse fashion.  The fetal head was delivered without difficulty.  The  mouth and nose were suctioned.  The remainder of the infant was delivered.  The cord was clamped and cut.  The infant was handed to the waiting  pediatric team.  Routine cord blood studies were obtained.  Blood was also  collected for cord blood registry.  The placenta was then removed.  The  uterine cavity was cleaned of amniotic fluid, clotted blood, and membranes.  The uterine incision was closed using a running locking suture of 2-0  Vicryl.  Hemostasis was adequate.  The  pericolonic gutters were cleaned of  amniotic fluid and clotted blood.  The pelvis was vigorously irrigated.  The  left fallopian tube was identified and followed to its fimbriated end.  A  knuckle of tube was made on the left using a suture ligature and a free tie  of 0 plain catgut.  The knuckle of tube thus made was excised.  Hemostasis  was adequate.  An identical procedure was carried out on the opposite side.  Again hemostasis was adequate.  The anterior peritoneum and the abdominal  musculature were reapproximated in the midline using 2-0 Vicryl.  The fascia  was irrigated.  The fascia was closed using a running suture of 0 Vicryl,  followed by three interrupted sutures of 0 Vicryl.  A Jackson-Pratt drain  was placed in the subcutaneous layer and brought out through the left lower  quadrant.  It was sutured into place using 3-0 silk.  The subcutaneous layer  was closed using a running suture of 0 Vicryl.  The skin was reapproximated  using a subcuticular suture of 4-0 Vicryl.  Sponge, needle, and instrument  counts were correct on two occasions.   The estimated blood loss was 800 mL.  The patient tolerated her procedure well.  She was taken to the recovery  room in stable condition.  The infant was taken to the full-term nursery in  stable condition.                                               Janine Limbo, M.D.    AVS/MEDQ  D:  11/19/2003  T:  11/19/2003  Job:  [id number]

## 2011-01-02 NOTE — Op Note (Signed)
NAME:  Bagent, Miamor              ACCOUNT NO.:  [id number]   MEDICAL RECORD NO.:  [id number]          PATIENT TYPE:  AMB   LOCATION:  SDS                          FACILITY:  [facility]   PHYSICIAN:  Gabrielle Dare. Janee Morn, M.D.DATE OF BIRTH:  Mar 21, 1961   DATE OF PROCEDURE:  12/13/2006  DATE OF DISCHARGE:                               OPERATIVE REPORT   PREOPERATIVE DIAGNOSIS:  Biliary colic.   POSTOPERATIVE DIAGNOSIS:  Biliary colic.   PROCEDURES:  Laparoscopic cholecystectomy with intraoperative  cholangiogram.   SURGEON:  Violeta Gelinas, M.D.   ASSISTANT:  Luretha Murphy, M.D.   ANESTHESIA:  General.   HISTORY OF PRESENT ILLNESS:  Ms. Bluett is a 50 year old female who I  evaluated the office for biliary colic.  Ultrasound shows thickened  gallbladder wall, she presents today for elective cholecystectomy.   PROCEDURE IN DETAIL:  Informed consent was obtained, the patient  received intravenous antibiotics.  She was brought to the operating  room.  General anesthesia was administered by the anesthesia staff.  Her  abdomen was prepped and draped in a sterile fashion.  Infraumbilical  region was infiltrated with quarter percent Marcaine with epinephrine.  Infraumbilical incision was made.  Subcutaneous tissues were dissected  down revealing the anterior fascia.  This was divided along the midline.  The peritoneal cavity was entered under direct vision.  A 0-0 Vicryl  pursestring suture was placed around the fascial opening and the Hassan  trocar was inserted into the abdomen.  The abdomen was insufflated with  carbon dioxide in the standard fashion under direct vision.  An 11 mm  epigastric and two 5-mm lateral ports were placed.  Quarter percent  Marcaine with epinephrine was used at all port sites.  The laparoscopic  exploration revealed a very small thickened gallbladder with some filmy  omental adhesions.  The omental adhesions were cleared away, the dome of  the gallbladder was  retracted superior medially and the infundibulum was  retracted inferolaterally.  Dissection began laterally and progressed  medially easily identifying the cystic duct.  The cystic artery was also  dissected at this time.  Dissection continued until a large window was  made between the cystic duct, infundibulum and the liver.  Once this was  done with excellent visualization, a clip was placed on the  infundibulocystic duct junction, small nick was made in the cystic duct  and a Reddick cholangiogram catheter was inserted.  Intraoperative  cholangiogram was obtained.  This demonstrated no common bile duct  filling defects, a long length of cystic duct and the flow of contrast  into the duodenum. Cholangiogram catheter was removed.  Three clips were  placed proximally on the cystic duct and was divided, two clips placed  proximally on an anterior and posterior branch of cystic artery and  those were divided as well.  The gallbladder was taken off the liver bed  with Bovie cautery getting excellent hemostasis along the way.  The  gallbladder was placed in EndoCatch bag, the liver bed was irrigated and  meticulous hemostasis was assured.  The clips remained in excellent  position.  Irrigation  fluid was evacuated.  It returned clear.  The  gallbladder was taken out the abdomen via the infraumbilical port site.  Infraumbilical fascia was closed by tying the 0-0 Vicryl pursestring  suture under direct vision.  The remainder of the irrigation fluid was  evacuated and it was clear.  The liver bed was rechecked. The liver bed  was dry.  Clips remained in excellent addition, the pneumoperitoneum was  then released after removing the ports under direct vision all four  wounds were copiously irrigated and the skin of each was closed with a  running 4-0 Vicryl  subcuticular stitch.  Sponge, needle, instrument counts were correct.  Benzoin, Steri-Strips and sterile dressings were applied.  The  patient  tolerated the procedure well without apparent complication, was taken to  recovery in stable condition.      Gabrielle Dare Janee Morn, M.D.  Electronically Signed     BET/MEDQ  D:  12/13/2006  T:  12/13/2006  Job:  [id number]   cc:   Valetta Mole. Swords, MD

## 2011-01-02 NOTE — Op Note (Signed)
NAME:  Destiny Dennis, Destiny Dennis              ACCOUNT NO.:  [id number]   MEDICAL RECORD NO.:  [id number]          PATIENT TYPE:  AMB   LOCATION:  DSC                          FACILITY:  [facility]   PHYSICIAN:  Leonides Grills, M.D.     DATE OF BIRTH:  1961/05/30   DATE OF PROCEDURE:  07/06/2006  DATE OF DISCHARGE:                               OPERATIVE REPORT   PREOPERATIVE DIAGNOSIS:  Complication of hardware right foot.   POSTOPERATIVE DIAGNOSIS:  Complication of hardware right foot.   OPERATIONS:  1. Hardware removal deep right foot.  2. Stress x-rays, right foot.   ANESTHESIA:  General.   SURGEON:  Leonides Grills, M.D.   ASSISTANT:  __________ PA-C.   ESTIMATED LOSS:  Minimal.   TOURNIQUET TIME:  Approximately 50 minutes.   COMPLICATIONS:  None.   DISPOSITION:  Stable to PR.   INDICATIONS:  This is a 50 year old female who had pain and tenderness  over her hardware following the right foot that was interfering with her  life to the point where she can not do what she wants to do.  She was  consented for above procedure.  All risks of infection, neuro vessel  injury, persistent pain, worse pain, scar tissue formation, stiffness,  were all explained.  Questions were encouraged and answered.   The patient brought to the operating placed in supine position after  adequate general tube anesthesia administered as well as Ancef 1 g IV  piggyback.  Bump placed on the right lateral hip, internally rotating  the right lower extremity and the right lower extremity was  prepped and  draped in a sterile manner over proximally placed thigh tourniquet.  Wound was gravity exsanguinated.  Tourniquet elevated 290 mmHg.  Screw  heads were palpated through the skin.  Weston Brass and spread technique was  utilized through the old incision down to the screw head.  Both screws  removed without complications and this was done under C-arm guidance.  Stress x-rays were obtained showed no gross  motion across the  fusion site.  Fusion was solid.  No other  abnormalities.  Tourniquet was deflated.  Hemostasis was obtained.  Wounds copiously irrigated normal saline.  Skin was closed 4-0 nylon.  Sterile dressing was applied.  Hard sole shoe was applied.  Patient  stable to PR.      Leonides Grills, M.D.  Electronically Signed     PB/MEDQ  D:  07/06/2006  T:  07/06/2006  Job:  [id number]

## 2011-01-02 NOTE — H&P (Signed)
NAME:  Destiny Dennis, Destiny Dennis                        ACCOUNT NO.:  [id number]   MEDICAL RECORD NO.:  [id number]                   PATIENT TYPE:  INP   LOCATION:  2019                                 FACILITY:  [facility]   PHYSICIAN:  Heloise Beecham, M.D. [facility]          DATE OF BIRTH:  1960/09/07   DATE OF ADMISSION:  01/13/2003  DATE OF DISCHARGE:                                HISTORY & PHYSICAL   CHIEF COMPLAINT:  Palpitations at 9:30 p.m. on the day of admission.   HISTORY OF PRESENT ILLNESS:  The patient is a very pleasant personal friend  of Dr. Juanda Chance who is 50 years old.  She reports approximately six weeks of  intermittent palpations.  She states that over the last couple of weeks she  has had a few bouts of short-lived palpitations and tachycardia.  On  Thursday prior to admission the patient had two hours of severe tachycardia,  palpitations and discomfort in the chest.  Today she had a sudden onset of  palpitations with tachycardia at 9:30 p.m.  She reports a positive family  history of atrial fibrillation and she is also status post recent knee  surgery and is currently using crutches for ambulation.   PAST MEDICAL HISTORY:  1. Knee surgery two weeks ago.  2. History of surgery on right shoulder and right elbow from tennis.  3. Normal echocardiogram two years ago by report.  4. The patient has had multiple miscarriages but has a 40-year-old child who     is healthy at the present time.   ALLERGIES:  No known drug allergies.   MEDICATIONS:  1. Celebrex p.r.n.  2. Percocet p.r.n.  3. Robaxin p.r.n.   SOCIAL HISTORY:  The patient lives in [place] with her husband.  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  She does not  smoke.  She drinks very little alcohol.  Does not use drugs.   FAMILY HISTORY:  Her mother has atrial fibrillation and is 67 years-old.  Her father is healthy at age 34.  Her older brother is age 52 with atrial  fibrillation,  he is a cardiologist in [facility].   REVIEW OF SYSTEMS:  Is otherwise negative for constitutional, HEENT, skin,  cardiopulmonary, GU, neuro, psyche, musculoskeletal, GI, endocrine, except  as noted above.   PHYSICAL EXAMINATION:  VITAL SIGNS: Heart rate is 100, respirations 20,  blood pressure 156/79, temperature is afebrile, she is saturating 96% on two  liters.  GENERAL: She is a healthy white female in no apparent distress.  HEENT: Pupils are equal, round and reactive to light and accommodation.  Extraocular movements are intact.  Mucous membranes or moist.  Oropharynx  clear.  NECK: Supple, no JVD, no bruits, 2+ carotid pulses.  No lymphadenopathy.  CARDIAC: The patient is mildly tachycardic but there are no murmurs, rubs,  or gallops.  LUNGS: Clear to auscultation bilaterally, no wheezes, rales or rhonchi.  SKIN: No rashes.  ABDOMEN: Soft, nontender, nondistended, no hepatosplenomegaly.  EXTREMITIES: No clubbing, cyanosis or edema.  No rashes, 2+ pulses in  bilateral lower extremities.  NEUROLOGIC: Intact.   STUDIES:  EKG shows atrial fibrillation with a rate of 166, the patient  converted spontaneously to normal sinus rhythm at 2323 p.m.   LABORATORY DATA:  White count 7100, hematocrit 37.6, hemoglobin 12.7,  glucose 301, sodium 135, potassium 3.0, chloride 106, bicarb 24, BUN 18,  creatinine 0.7, glucose 121, bilirubin 0.7, AST 15, ALT 16, alkaline  phosphatase 78, total protein 6.3, albumin 4.0.   ASSESSMENT AND PLAN:  The patient is a 50 year old who presents with atrial  fibrillation.  1. Atrial fibrillation.  The patient is postop knee surgery with hypokalemia     and mild anemia. The patient will be ruled out for myocardial infarction.     We will follow her on a telemetry monitor, check a TSH, repeat her     potassium and check her magnesium.  We will also start a beta blocker and     give her whole dose aspirin.  I will hold on giving her heparin for now     but  it may be that the patient is low risk for thromboembolism and can be     on full dose aspirin long term rather than Coumadin.  She is very     concerned about being on Coumadin long term.  The patient will need an     echocardiogram and a pharmacologic Cardiolite on Monday since the patient     is non ambulatory.  As stated above we will make a judgment on Coumadin     versus aspirin after following the patient's telemetry, etc.  The patient     is currently in normal sinus rhythm after spontaneously converting.  We     will also follow her potassium closely.  2. Pain control, I will continue her Celebrex p.r.n. and Percocet p.r.n.  3. I will also send a urinalysis and a hemoglobin A1C and a TSH.                                               Heloise Beecham, M.D. [facility]    DWM/MEDQ  D:  01/14/2003  T:  01/14/2003  Job:  [id number]

## 2011-01-02 NOTE — Discharge Summary (Signed)
NAME:  Destiny Dennis, Destiny Dennis                        ACCOUNT NO.:  [id number]   MEDICAL RECORD NO.:  [id number]                   PATIENT TYPE:  INP   LOCATION:  2019                                 FACILITY:  [facility]   PHYSICIAN:  Pearletha Alfred, M.D.                DATE OF BIRTH:  Aug 31, 1960   DATE OF ADMISSION:  01/13/2003  DATE OF DISCHARGE:  01/15/2003                           DISCHARGE SUMMARY - REFERRING   DISCHARGING PHYSICIAN:  Charlies Constable, M.D.   SUMMARY OF HISTORY:  Destiny Dennis is a 50 year old white female who presented  with a 6-week history of intermittent palpitations. On Thursday, just prior  to admission, she described 2 hours of severe tachycardia and palpitations;  and uncomfortableness in her chest.  On the day of admission the patient  had sudden of palpitations at approximately 9:30 p.m.  She also reports a  family history of atrial fibrillation and recent knee surgery.  She also has  had surgery on her shoulder and elbow and a normal echocardiogram  approximately 2 years ago.  Prior to admission she was on Celebrex,  Percocet, and Robaxin p.r.n. It is also noted that her older brother also  has atrial fibrillation.   LABORATORY DATA:  A serial 2 troponins and 2 CK/MBs were negative for  myocardial infarction.  Fasting lipids showed total cholesterol 166,  triglycerides 116, HDL 66, LDL 85.  Hemoglobin A1c was 5.0.  Admission  sodium was 135, potassium 3.0, BUN 18, creatinine 0.7, normal LFTs.  Subsequent chemistry did show correction of her hypokalemia.  Her potassium  was 4.0 on May 3rd.  Admission PT was 13.2, PTT 36, D-dimer was 10.22.  Admission H&H of 12.7 and 37.6, normal indices, platelets 301, PTT 7.1.  TSH  was 2.134, fasting lipids showed a total cholesterol of 164, triglycerides  116, HDL 56, LDL 85.  Chest x-ray did not show any active disease.  Admission EKG showed atrial fibrillation with ventricular rate of 166,  nonspecific ST-T wave changes.   Subsequent EKG showed sinus tachycardia then  normal sinus rhythm, normal axis, nonspecific ST-T wave changes.   HOSPITAL COURSE:  Destiny Dennis, while in the emergency room, spontaneously  converted to normal sinus rhythm.  She was admitted essentially for  observation to rule out myocardial infarction since she had  uncomfortableness associated with her symptoms.  The previously mentioned  labs were essentially unremarkable.  Dr. Andee Lineman noted that she does not need  long term Coumadin, but would favor in hospital Cardiolite and  echocardiogram.  On May 31 echocardiogram was performed and verbal report  per Dr. Eden Emms did not show any abnormality.  Imaging on her adenosine  Cardiolite showed an EF of 54% and no ischemia.  Dr. Huston Foley felt that she  could be discharged home.   DISCHARGE DIAGNOSES:  1. Paroxysmal atrial fibrillation with an increased ventricular rate and     spontaneous conversion to normal  sinus rhythm.  2. Hypokalemia.   DISPOSITION:  Destiny Dennis is discharged home.  She was asked to begin  enteric coated 325 mg daily and she received a prescription for Toprol-XL 25  mg daily.  Activity was not restricted from a cardiovascular standpoint, but  it was restricted in regards to her recent knee surgery.  She was asked to  maintain a low sat, fat, and cholesterol diet.  She will Dr. Mercy Riding' office  to make a follow up appointment.  She was also instructed to avoid  pseudoephedrine products.  She could continue her Celebrex, Percocet, and  Robaxin p.r.n.     [name], P.A. [facility]                    Pearletha Alfred, M.D.    EW/MEDQ  D:  01/15/2003  T:  01/15/2003  Job:  [id number]   cc:   Mosetta Putt, M.D.  [address] [place]  [place] [address]  Fax: [phone]   Charlies Constable, M.D.   Percival Spanish, M.D.  [address]  [place]  [place] [address]  Fax: [phone]

## 2011-01-02 NOTE — Assessment & Plan Note (Signed)
[facility]                                 ON-CALL NOTE   Destiny Dennis, Destiny Dennis                     MRN:          [id number]  DATE:09/22/2008                            DOB:          10-01-1960    PRIMARY CARDIOLOGIST:  Everardo Beals. Juanda Chance, MD, [facility].   PROBLEM:  Destiny Dennis called today for complaint of palpitations, after  undergoing knee surgery earlier this week.  She was cleared for surgery  by Dr. Charlies Constable a few weeks ago.  She has history of PAF, PVCs, and  normal left ventricular function.  She is on full dose aspirin and has  never been treated with Coumadin.   Destiny Dennis admits that she is feeling a bit more stress and anxiety  since her recent surgery, but also feels that she is having an increased  frequency of these palpitations.  The way she describes them to me, they  sound quite regular and most consistent with PVCs rather than atrial  fibrillation.  She also corroborates this by stating that her rate is,  in fact, regular.   PLAN:  I assured her that these most likely are PVCs and, given her  history, she has a very low risk for cardiac events.  Moreover, they are  essentially asymptomatic, other than the fact that she is quite aware of  them.  Therefore, I recommended that I call in some p.r.n. Lopressor 25  mg for her, to be taken as needed.  She was quite pleased with this  recommendation.  She is to notify us if her symptoms exacerbate.     Gene Serpe, PA-C  Electronically Signed    GS/MedQ  DD: 09/22/2008  DT: 09/23/2008  Job #: [id number]

## 2011-01-14 ENCOUNTER — Encounter: Payer: Self-pay | Admitting: Internal Medicine

## 2011-01-14 ENCOUNTER — Ambulatory Visit (INDEPENDENT_AMBULATORY_CARE_PROVIDER_SITE_OTHER): Payer: BC Managed Care – PPO | Admitting: Internal Medicine

## 2011-01-14 DIAGNOSIS — J40 Bronchitis, not specified as acute or chronic: Secondary | ICD-10-CM

## 2011-01-14 DIAGNOSIS — K219 Gastro-esophageal reflux disease without esophagitis: Secondary | ICD-10-CM

## 2011-01-14 MED ORDER — CHLORPHENIRAMINE-HYDROCODONE 8-10 MG/5ML PO LQCR: 5.0000 mL | Freq: Two times a day (BID) | ORAL | Status: DC | PRN

## 2011-01-14 MED ORDER — OMEPRAZOLE 40 MG PO CPDR: 40.0000 mg | DELAYED_RELEASE_CAPSULE | Freq: Every day | ORAL | Status: DC

## 2011-01-14 MED ORDER — DOXYCYCLINE HYCLATE 100 MG PO TABS: 100.0000 mg | ORAL_TABLET | Freq: Two times a day (BID) | ORAL | Status: AC

## 2011-01-15 ENCOUNTER — Encounter: Payer: Self-pay | Admitting: Internal Medicine

## 2011-01-17 NOTE — Assessment & Plan Note (Signed)
Begin doxycycline. Attempts Tussionex p.r.n. And cautioned regarding possible sedating effect.Followup if no improvement or worsening.

## 2011-01-17 NOTE — Assessment & Plan Note (Signed)
suboptimal control. Increase omeprazole 40 mg daily.

## 2011-01-17 NOTE — Progress Notes (Signed)
  Subjective:    Patient ID: Destiny Dennis, female    DOB: 06-30-1961, 50 y.o.   MRN: [id number]  HPI Patient presents to clinic for evaluation of cough. The tumor and a half week history of sore throat, nasal drainage and cough productive for green sputum without hemoptysis. Has frontal sinus pressure. Denies fever, chills, shortness of breath or wheezing. Did have positive sick exposure. Also notes history of GERD symptoms but suboptimal control on Prilosec 20 mg a day. Symptoms are worse at night. Has had reported EGD in the past. Denies dysphagia, abdominal pain or blood in stool. Compliant with PPI without adverse effect. No other complaints  Reviewed past medical history, medications and allergies    Review of Systems see history of present illness     Objective:   Physical Exam  Nursing note and vitals reviewed. Constitutional: She appears well-developed and well-nourished. No distress.  HENT:  Head: Normocephalic and atraumatic.  Right Ear: External ear normal.  Left Ear: External ear normal.  Nose: Nose normal.  Mouth/Throat: Oropharynx is clear and moist. No oropharyngeal exudate.  Eyes: Right eye exhibits no discharge. Left eye exhibits no discharge. No scleral icterus.  Neck: Neck supple.  Cardiovascular: Normal rate, regular rhythm and normal heart sounds.  Exam reveals no gallop and no friction rub.   No murmur heard. Pulmonary/Chest: Effort normal and breath sounds normal. No respiratory distress. She has no wheezes. She has no rales.  Lymphadenopathy:    She has no cervical adenopathy.  Neurological: She is alert.  Skin: Skin is warm and dry. She is not diaphoretic.  Psychiatric: She has a normal mood and affect.          Assessment & Plan:

## 2011-02-08 ENCOUNTER — Telehealth: Payer: Self-pay | Admitting: Adult Health

## 2011-02-08 NOTE — Telephone Encounter (Signed)
Destiny Dennis called to inquire about taking extra dose of metoprolol.  She had one incidence of tachycardia after eating a spicy meal.  She is a patient of Dr. Juanda Chance and has not seen him since he retired. She takes 50mg  of Metoprolol daily.  She took her dose but had the break through tachycardia later that day.  Had not had an episode in several months.  After the episode she took her BP and was found to be  108/56.  I have advised her not to take extra dose of metoprolol with BP so low, with normal HR at this time, of 78.  She is to call our office to make appointment for follow-up on Monday to be seen by cardiologist for follow-up.  She verbalized understanding.  Joni Reining NP

## 2011-02-11 ENCOUNTER — Encounter: Payer: Self-pay | Admitting: Cardiology

## 2011-02-11 ENCOUNTER — Ambulatory Visit (INDEPENDENT_AMBULATORY_CARE_PROVIDER_SITE_OTHER): Payer: BC Managed Care – PPO | Admitting: Cardiology

## 2011-02-11 DIAGNOSIS — I472 Ventricular tachycardia, unspecified: Secondary | ICD-10-CM

## 2011-02-11 DIAGNOSIS — I471 Supraventricular tachycardia: Secondary | ICD-10-CM | POA: Insufficient documentation

## 2011-02-11 DIAGNOSIS — R002 Palpitations: Secondary | ICD-10-CM

## 2011-02-11 MED ORDER — METOPROLOL SUCCINATE ER 50 MG PO TB24: 75.0000 mg | ORAL_TABLET | Freq: Every day | ORAL | Status: DC

## 2011-02-11 NOTE — Assessment & Plan Note (Signed)
Per patient one isolated episode in 1998. Continue beta blocker and aspirin.

## 2011-02-11 NOTE — Assessment & Plan Note (Signed)
Continues to have palpitations similar to the PVCs as well. Increase Toprol to 75 mg daily.

## 2011-02-11 NOTE — Progress Notes (Signed)
HPI: Pleasant female for management of PVCs, palpitations and remote history of atrial fibrillation. Previously followed by Dr. Juanda Chance. Myoview in 2004 showed EF 54 and no ischemia. Echo in 2008 showed normal LV function and mild LAE. Last seen in 2010. Since then, she continues to have palpitations described as a skip. These are similar to her PVCs in the past and she has had these intermittently for years. Over the past 2 months she has also had episodes of her heart racing. This is sudden in onset and resolved spontaneously after one hour. There is mild dizziness but no syncope. No dyspnea or chest pain. Her last episode was 5 days ago. She otherwise does not have dyspnea on exertion, orthopnea, PND, pedal edema or exertional chest pain.  Current Outpatient Prescriptions  Medication Sig Dispense Refill  . aspirin 81 MG tablet Take 81 mg by mouth daily.        . chlorpheniramine-hydrocodone (TUSSIONEX) 8-10 MG/5ML suspension Take 5 mLs by mouth every 12 (twelve) hours as needed for cough.  120 mL  0  . levocetirizine (XYZAL) 5 MG tablet Take 5 mg by mouth as needed.       . metoprolol (TOPROL-XL) 50 MG 24 hr tablet TAKE 1 TABLET BY MOUTH EVERY DAY  90 tablet  3  . mometasone (NASONEX) 50 MCG/ACT nasal spray Place 2 sprays into the nose daily.        Marland Kitchen omeprazole (PRILOSEC) 40 MG capsule Take 1 capsule (40 mg total) by mouth daily.  30 capsule  11  . PROMETRIUM 100 MG capsule Take 100 mg by mouth daily.       Marland Kitchen VIVELLE-DOT 0.1 MG/24HR Place 1 patch onto the skin 2 (two) times a week.          Past Medical History  Diagnosis Date  . Atrial fibrillation 03/05/2007  . 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 GERD 12/23/2009  . Sialoadenitis 03/18/2010  . Hyperlipidemia   . SVT (supraventricular tachycardia)     brief  . PVC (premature ventricular contraction)     Past Surgical History  Procedure Date  . Knee arthroscopy     x6 left knee  . Cholecystectomy 5/08    laparoscopic     History   Social History  . Marital Status: Married    Spouse Name: N/A    Number of Children: N/A  . Years of Education: N/A   Occupational History  . Not on file.   Social History Main Topics  . Smoking status: Never Smoker   . Smokeless tobacco: Not on file  . Alcohol Use: No  . Drug Use: Not on file  . Sexually Active: Not on file   Other Topics Concern  . Not on file   Social History Narrative  . No narrative on file    ROS: no fevers or chills, productive cough, hemoptysis, dysphasia, odynophagia, melena, hematochezia, dysuria, hematuria, rash, seizure activity, orthopnea, PND, pedal edema, claudication. Remaining systems are negative.  Physical Exam: Well-developed well-nourished in no acute distress.  Skin is warm and dry.  HEENT is normal.  Neck is supple. No thyromegaly.  Chest is clear to auscultation with normal expansion.  Cardiovascular exam is regular rate and rhythm.  Abdominal exam nontender or distended. No masses palpated. Extremities show no edema. neuro grossly intact  ECG NSR with no ST changes.

## 2011-02-11 NOTE — Assessment & Plan Note (Signed)
Patient is having episodes of what sounds to be SVT. We discussed the options today. Plan increase Toprol to 75 mg daily. Followup in 3 months. If she continues to have frequent episodes planned CardioNet and possible referral to EP for consideration of ablation if SVT identified. Check echocardiogram.

## 2011-02-11 NOTE — Patient Instructions (Signed)
Your physician recommends that you schedule a follow-up appointment in: 3 MONTHS  Your physician has requested that you have an echocardiogram. Echocardiography is a painless test that uses sound waves to create images of your heart. It provides your doctor with information about the size and shape of your heart and how well your heart's chambers and valves are working. This procedure takes approximately one hour. There are no restrictions for this procedure.   INCREASE METOPROLOL SUCC 50MG  TAKE ONE AND ONE HALF TABLET ONCE DAILY

## 2011-02-12 ENCOUNTER — Encounter: Payer: Self-pay | Admitting: *Deleted

## 2011-02-24 ENCOUNTER — Ambulatory Visit (HOSPITAL_COMMUNITY): Payer: BC Managed Care – PPO | Attending: Cardiology | Admitting: Radiology

## 2011-02-24 DIAGNOSIS — I472 Ventricular tachycardia: Secondary | ICD-10-CM

## 2011-02-24 DIAGNOSIS — I379 Nonrheumatic pulmonary valve disorder, unspecified: Secondary | ICD-10-CM | POA: Insufficient documentation

## 2011-02-24 DIAGNOSIS — R002 Palpitations: Secondary | ICD-10-CM

## 2011-04-26 ENCOUNTER — Inpatient Hospital Stay (HOSPITAL_COMMUNITY)
Admission: EM | Admit: 2011-04-26 | Discharge: 2011-04-29 | DRG: 182 | Disposition: A | Discharge status: Home / Self Care | Payer: BC Managed Care – PPO | Source: Ambulatory Visit | Attending: Internal Medicine | Admitting: Internal Medicine

## 2011-04-26 DIAGNOSIS — N390 Urinary tract infection, site not specified: Secondary | ICD-10-CM | POA: Diagnosis present

## 2011-04-26 DIAGNOSIS — G8929 Other chronic pain: Secondary | ICD-10-CM | POA: Diagnosis present

## 2011-04-26 DIAGNOSIS — K5732 Diverticulitis of large intestine without perforation or abscess without bleeding: Principal | ICD-10-CM | POA: Diagnosis present

## 2011-04-26 DIAGNOSIS — E785 Hyperlipidemia, unspecified: Secondary | ICD-10-CM | POA: Diagnosis present

## 2011-04-26 DIAGNOSIS — I10 Essential (primary) hypertension: Secondary | ICD-10-CM | POA: Diagnosis present

## 2011-04-26 DIAGNOSIS — Z7982 Long term (current) use of aspirin: Secondary | ICD-10-CM

## 2011-04-26 DIAGNOSIS — D649 Anemia, unspecified: Secondary | ICD-10-CM | POA: Diagnosis present

## 2011-04-26 LAB — POCT I-STAT, CHEM 8
BUN: 12 mg/dL (ref 6–23)
Calcium, Ion: 1.11 mmol/L — ABNORMAL LOW (ref 1.12–1.32)
Chloride: 105 mEq/L (ref 96–112)
Glucose, Bld: 106 mg/dL — ABNORMAL HIGH (ref 70–99)

## 2011-04-26 LAB — DIFFERENTIAL
Eosinophils Absolute: 0.1 10*3/uL (ref 0.0–0.7)
Eosinophils Relative: 1 % (ref 0–5)
Lymphs Abs: 1.4 10*3/uL (ref 0.7–4.0)
Monocytes Absolute: 1 10*3/uL (ref 0.1–1.0)
Monocytes Relative: 7 % (ref 3–12)

## 2011-04-26 LAB — URINALYSIS, ROUTINE W REFLEX MICROSCOPIC
Ketones, ur: 15 mg/dL — AB
Nitrite: POSITIVE — AB
pH: 5.5 (ref 5.0–8.0)

## 2011-04-26 LAB — CBC
MCH: 30 pg (ref 26.0–34.0)
MCV: 85.3 fL (ref 78.0–100.0)
Platelets: 303 10*3/uL (ref 150–400)
RBC: 4.36 MIL/uL (ref 3.87–5.11)
RDW: 13.6 % (ref 11.5–15.5)

## 2011-04-26 LAB — URINE MICROSCOPIC-ADD ON

## 2011-04-27 ENCOUNTER — Emergency Department (HOSPITAL_COMMUNITY): Payer: BC Managed Care – PPO

## 2011-04-27 LAB — DIFFERENTIAL
Lymphocytes Relative: 16 % (ref 12–46)
Lymphs Abs: 1.7 10*3/uL (ref 0.7–4.0)
Neutrophils Relative %: 73 % (ref 43–77)

## 2011-04-27 LAB — HEPATIC FUNCTION PANEL
ALT: 16 U/L (ref 0–35)
Albumin: 4.2 g/dL (ref 3.5–5.2)
Indirect Bilirubin: 0.5 mg/dL (ref 0.3–0.9)
Total Protein: 7.4 g/dL (ref 6.0–8.3)

## 2011-04-27 LAB — COMPREHENSIVE METABOLIC PANEL
BUN: 11 mg/dL (ref 6–23)
CO2: 26 mEq/L (ref 19–32)
Chloride: 105 mEq/L (ref 96–112)
Creatinine, Ser: 0.63 mg/dL (ref 0.50–1.10)
GFR calc non Af Amer: >60 mL/min (ref >60)
Glucose, Bld: 103 mg/dL — ABNORMAL HIGH (ref 70–99)
Total Bilirubin: 0.5 mg/dL (ref 0.3–1.2)

## 2011-04-27 LAB — CBC
HCT: 33.5 % — ABNORMAL LOW (ref 36.0–46.0)
MCV: 87.2 fL (ref 78.0–100.0)
RBC: 3.84 MIL/uL — ABNORMAL LOW (ref 3.87–5.11)
WBC: 10.2 10*3/uL (ref 4.0–10.5)

## 2011-04-27 LAB — MAGNESIUM: Magnesium: 2.1 mg/dL (ref 1.5–2.5)

## 2011-04-27 MED ORDER — IOHEXOL 300 MG/ML  SOLN
100.0000 mL | Freq: Once | INTRAMUSCULAR | Status: AC | PRN
  Administered 2011-04-27: 100 mL via INTRAVENOUS

## 2011-04-30 NOTE — Discharge Summary (Signed)
NAME:  Destiny Dennis, Destiny Dennis              ACCOUNT NO.:  [id number]  MEDICAL RECORD NO.:  [id number]  LOCATION:  5148                         FACILITY:  [facility]  PHYSICIAN:  Conley Canal, MD      DATE OF BIRTH:  10-Apr-1961  DATE OF ADMISSION:  04/26/2011 DATE OF DISCHARGE:  04/29/2011                        DISCHARGE SUMMARY - REFERRING   PRIMARY CARE PHYSICIAN:  Valetta Mole. Swords, MD  GASTROENTEROLOGIST:  Iva Boop, MD, [facility]  DISCHARGE DIAGNOSES: 1. Acute diverticulitis involving the sigmoid colon. 2. Urinary tract infection. 3. History of multiple surgeries including cholecystectomy, left knee     surgery, right shoulder surgery, right elbow surgery, right foot     surgery, cervical fusion, left wrist surgery, cesarean section x2. 4. Hyperlipidemia. 5. Osteoarthritis. 6. History of premature ventricular complexes.  DISCHARGE MEDICATIONS: 1. Augmentin 875 mg twice daily to take for 12 days if the patient not     tolerating Flagyl. 2. Ciprofloxacin 500 mg twice daily for 12 more days. 3. Flagyl 500 mg three times daily for 12 more days. 4. Percocet 5/325 mg 1-2 tablets every 4 hours as needed 28 tablets     given. 5. Aspirin 81 mg daily. 6. Calcium OTC 2 tablets daily. 7. CoQ10 OTC 1 tablet daily. 8. Diazepam 5 mg at bedtime as needed. 9. Estradiol patch topically daily. 10.Fish oil OTC 2 tablets daily. 11.Magnesium oxide OTC 1 tablet daily. 12.Toprol-XL 75 mg daily. 13.Multivitamins 1 tablet daily. 14.Progesterone 2 tablets 100 mg at bedtime.  PROCEDURES PERFORMED: 1. CT of abdomen and pelvis on April 27, 2011, showed acute     diverticulitis at the mid-to-distal sigmoid colon with colonic wall     thickening and inflamed diverticula without evidence for     perforation or abscess formation.  HOSPITAL COURSE:  [name] is a pleasant 50 year old female, who came in with complaints of fever and abdominal pain and was found to have acute sigmoid diverticulitis and  evidence of UTI.  Her white count on admission was 15,000 with 83% neutrophils.  The patient was hence started on IV antibiotics including Flagyl and ciprofloxacin.  On April 28, 2011, the patient had some vomiting after taking antibiotics on an empty stomach.  This raised concern for intolerance to Flagyl, although the patient believes it may be due to that she took the antibiotics on an empty stomach.  She has improved clinically, although she occasionally has some abdominal pain.  She is tolerating oral intake.  Her white count improved to 10,000 on April 27, 2011. Blood cultures were negative x2.  Plans are for the patient to be discharged home to complete 2 weeks of antibiotics, after which point she should have colonoscopy.  She will follow up with Dr. Birdie Sons to arrange for GI referral with Dr. Leone Payor.  I encouraged high-fiber diet if she has worsening symptoms of abdominal pain, fever, and chills and maybe indication of complications of diverticulitis and I directed her to seek help.  Otherwise, she feels better today and she will try to take Flagyl and ciprofloxacin which are the standard treatments for diverticulitis.  If she is not tolerating Flagyl, I directed her to fill prescription for Augmentin.  She  is discharged in stable condition.  The time spent for discharge preparation is less than 30 minutes.     Conley Canal, MD     SR/MEDQ  D:  04/29/2011  T:  04/29/2011  Job:  [id number]  cc:   Valetta Mole. Swords, MD Iva Boop, MD,FACG  Electronically Signed by Conley Canal  on 04/30/2011 05:21:47 PM

## 2011-05-01 ENCOUNTER — Encounter: Payer: Self-pay | Admitting: Internal Medicine

## 2011-05-03 LAB — CULTURE, BLOOD (ROUTINE X 2)
Culture  Setup Time: 201209101024
Culture: NO GROWTH

## 2011-05-04 ENCOUNTER — Ambulatory Visit (INDEPENDENT_AMBULATORY_CARE_PROVIDER_SITE_OTHER): Payer: BC Managed Care – PPO | Admitting: Cardiology

## 2011-05-04 ENCOUNTER — Encounter: Payer: Self-pay | Admitting: Cardiology

## 2011-05-04 VITALS — BP 124/77 | HR 70 | Resp 12 | Wt 244.0 lb

## 2011-05-04 DIAGNOSIS — I498 Other specified cardiac arrhythmias: Secondary | ICD-10-CM

## 2011-05-04 DIAGNOSIS — I471 Supraventricular tachycardia, unspecified: Secondary | ICD-10-CM

## 2011-05-04 DIAGNOSIS — R002 Palpitations: Secondary | ICD-10-CM

## 2011-05-04 DIAGNOSIS — I4891 Unspecified atrial fibrillation: Secondary | ICD-10-CM

## 2011-05-04 LAB — TSH: TSH: 1.14 u[IU]/mL (ref 0.35–5.50)

## 2011-05-04 MED ORDER — METOPROLOL SUCCINATE ER 100 MG PO TB24: 100.0000 mg | ORAL_TABLET | Freq: Every day | ORAL | Status: DC

## 2011-05-04 NOTE — Assessment & Plan Note (Signed)
Symptoms sound to be PVCs. LV function normal. Increase Toprol to 100 mg daily. Check TSH.

## 2011-05-04 NOTE — Patient Instructions (Signed)
Your physician wants you to follow-up in: 6 MONTHS You will receive a reminder letter in the mail two months in advance. If you don't receive a letter, please call our office to schedule the follow-up appointment    INCREASE METOPROLOL TO 100 MG ONCE DAILY  Your physician recommends that you return for lab work in: TODAY .

## 2011-05-04 NOTE — Assessment & Plan Note (Signed)
Increase beta blocker as described.

## 2011-05-04 NOTE — Progress Notes (Signed)
[id number] female for management of PVCs, palpitations and remote history of atrial fibrillation. Previously followed by Dr. Juanda Chance. Myoview in 2004 showed EF 54 and no ischemia. Echo in 2008 showed normal LV function and mild LAE.When I saw her in June of 2012, she was having increased palpitations and Toprol was increased. Echocardiogram in July 2012 showed normal LV function and moderate left atrial enlargement. Since I last saw her, she states her palpitations have improved. She has not had sustained palpitations but she does still feel a skip when she lies on her right side at night. No dyspnea, chest pain or syncope.  Current Outpatient Prescriptions  Medication Sig Dispense Refill  . aspirin 81 MG tablet Take 81 mg by mouth daily.        . chlorpheniramine-hydrocodone (TUSSIONEX) 8-10 MG/5ML suspension Take 5 mLs by mouth every 12 (twelve) hours as needed for cough.  120 mL  0  . Ciprofloxacin (CIPRO PO) Take by mouth as directed.        . diazepam (VALIUM) 5 MG tablet Take 1 tablet by mouth At bedtime.      Marland Kitchen levocetirizine (XYZAL) 5 MG tablet Take 5 mg by mouth as needed.       . metoprolol (TOPROL-XL) 50 MG 24 hr tablet Take 1.5 tablets (75 mg total) by mouth daily.  90 tablet  3  . MetroNIDAZOLE (FLAGYL PO) Take by mouth as directed.        . mometasone (NASONEX) 50 MCG/ACT nasal spray Place 2 sprays into the nose daily.        Marland Kitchen PROMETRIUM 100 MG capsule Take 100 mg by mouth daily.       Marland Kitchen VIVELLE-DOT 0.1 MG/24HR Place 1 patch onto the skin 2 (two) times a week.          Past Medical History  Diagnosis Date  . Atrial fibrillation 03/05/2007  . 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 GERD 12/23/2009  . Sialoadenitis 03/18/2010  . Hyperlipidemia   . SVT (supraventricular tachycardia)     brief  . PVC (premature ventricular contraction)     Past Surgical History  Procedure Date  . Knee arthroscopy     x6 left knee  . Cholecystectomy 5/08    laparoscopic    History    Social History  . Marital Status: Married    Spouse Name: N/A    Number of Children: N/A  . Years of Education: N/A   Occupational History  . Not on file.   Social History Main Topics  . Smoking status: Never Smoker   . Smokeless tobacco: Not on file  . Alcohol Use: No  . Drug Use: Not on file  . Sexually Active: Not on file   Other Topics Concern  . Not on file   Social History Narrative  . No narrative on file    ROS: no fevers or chills, productive cough, hemoptysis, dysphasia, odynophagia, melena, hematochezia, dysuria, hematuria, rash, seizure activity, orthopnea, PND, pedal edema, claudication. Remaining systems are negative.  Physical Exam: Well-developed well-nourished in no acute distress.  Skin is warm and dry.  HEENT is normal.  Neck is supple. No thyromegaly.  Chest is clear to auscultation with normal expansion.  Cardiovascular exam is regular rate and rhythm.  Abdominal exam nontender or distended. No masses palpated. Extremities show no edema. neuro grossly intact

## 2011-05-04 NOTE — Assessment & Plan Note (Signed)
Remote history of atrial fibrillation. Continue aspirin and beta blocker. If she has prolonged palpitations will consider CardioNet to further evaluate.

## 2011-05-05 NOTE — H&P (Signed)
NAME:  Destiny Dennis, Destiny Dennis              ACCOUNT NO.:  [id number]  MEDICAL RECORD NO.:  [id number]  LOCATION:                                 FACILITY:  PHYSICIAN:  Annetta Deiss, DO         DATE OF BIRTH:  12-30-1960  DATE OF ADMISSION: DATE OF DISCHARGE:                             HISTORY & PHYSICAL   CHIEF COMPLAINT:  Fever and abdominal pain.  HISTORY OF PRESENT ILLNESS:  The patient is a 50 year old female who states that she was at lunch on April 25, 2011, when she had sudden onset of left lower quadrant abdominal pain and states she went home and just kind of thought it might get better and she was on her periods, so she thought maybe that was it, but over the course of the next day, she developed a fever and her pain became worse and she came to the emergency department on the evening of April 26, 2011.  PAST MEDICAL HISTORY:  Significant for PVCs.  She has an ejection fraction of 55-60%, hyperlipidemia, and osteoarthritis.  PAST SURGICAL HISTORY: 1. Cholecystectomy. 2. Left knee surgery. 3. Right shoulder surgery. 4. Right elbow surgery. 5. Right foot surgery. 6. Cervical fusion. 7. Left wrist. 8. C-section x12.  MEDICATIONS AT HOME:  Enteric-coated aspirin 81 mg one p.o. daily, metoprolol XL 75 mg one p.o. daily, progesterone, estradiol, and Valium 5 mg one p.o. nightly p.r.n. spasm.  ALLERGIES:  No known drug allergies.  REVIEW OF SYSTEMS:  CONSTITUTIONAL:  Positive for fever.  Positive for chills.  Positive for weakness.  Positive for fatigue.  CENTRAL NERVOUS SYSTEM:  No headaches, seizures, or limb weakness.  ENT:  No nasal congestion or throat pain.  No coryza.  CARDIOVASCULAR:  No chest pain or palpitations.  No orthopnea.  RESPIRATORY:  No cough, no shortness of breath, no wheezing.  GASTROINTESTINAL:  No nausea, no vomiting. Occasional constipation and diarrhea.  GENITOURINARY:  No dysuria, no hematuria, no urinary frequency.  RENAL:  No flank pain.   No swelling. No pruritus.  SKIN:  No rashes.  No sores or lesions.  HEMATOLOGIC:  No easy bruising, no purpura or clots.  LYMPHATIC:  No lymphadenopathy.  No painful lymph nodes.  No specific lymph swelling.  PSYCHIATRIC:  No anxiety.  No depression.  No insomnia.  SOCIAL HISTORY:  No tobacco, no alcohol, no recreational drug use.  FAMILY HISTORY:  Two brothers and one sister all have had diverticulitis, one brother has had surgery for diverticulitis.  PHYSICAL EXAMINATION:  VITAL SIGNS:  Temperature 100.3 upon arrival, pulse 108, respiratory rate 18, O2 sat 96% on room air, blood pressure 154/79. GENERAL:  The patient is awake, alert, and ordered x3 and able to give fair history. HEENT:  Eyes:  Pupils equal, round, and reactive to light and accommodation.  External ocular movements are bilaterally intact. Sclerae nonicteric, noninjected.  Mouth:  Oral mucosa moist.  No lesions.  No sores.  Pharynx clear, no erythema, no exudate. NECK:  Negative for JVD.  Negative for thyromegaly.  Negative for lymphadenopathy. HEART:  Regular rate and rhythm at 80 beats per minute without murmur, ectopy, or gallops.  No lateral  PMI.  No thrills. LUNGS:  Clear to auscultation bilaterally without wheezes, rales, or rhonchi.  No increased work of breathing.  No tactile fremitus. ABDOMEN:  Soft, positive for left lower quadrant tenderness.  Positive bowel sounds.  No hepatosplenomegaly.  No hernias palpated. CARDIOVASCULAR:  Extremities were negative for cyanosis, clubbing, or edema.  The patient has positive dorsalis pedis and popliteal pulses bilaterally.  No carotid bruits bilaterally. NEUROLOGIC:  Cranial nerves II through XII grossly intact.  Motor and sensory intact.  LABORATORY DATA:  WBCs 15.1, hemoglobin 13.3, hematocrit 39, platelets of 303, neutrophils are 89%, lymphs 9%.  Sodium 139, potassium 3.7, chloride 105, CO2 is 21, creatinine 0.9, BUN is 12.  Urine pregnancy is negative.   Urinalysis positive for UTI.  CT of the abdomen and pelvis shows acute diverticulitis to mid distal sigmoid colon with colonic wall thickening and focal soft tissue stranding.  ASSESSMENT: 1. Diverticulitis. 2. Urinary tract infection. 3. Chronic pain. 4. Hypertension.  PLAN: 1. Admit. 2. IV antibiotics. 3. IV fluids. 4. Clear liquid diet. 5. Continue home meds. 6. Pain control.  I spent 44 minutes on this admission.          ______________________________ Fran Lowes, DO     AS/MEDQ  D:  04/27/2011  T:  04/27/2011  Job:  [id number]  cc:   Valetta Mole. Swords, MD  Electronically Signed by Fran Lowes DO on 05/05/2011 10:42:52 PM

## 2011-05-06 ENCOUNTER — Telehealth: Payer: Self-pay | Admitting: Internal Medicine

## 2011-05-06 NOTE — Telephone Encounter (Signed)
Pt received Flagyl while was in the hosp for diverticulitis. She was taking them tid. Patient stated that she has been out of Flagyl for 3 days, so the pharmacist will give her a few pills until they hear back from Dr Cato Mulligan or the nurse of what to do? Please advise [facility]. Thanks.

## 2011-05-07 NOTE — Telephone Encounter (Signed)
Med list does not say what mg

## 2011-05-07 NOTE — Telephone Encounter (Signed)
Robin from [employer] callback needs advice.

## 2011-05-08 NOTE — Telephone Encounter (Signed)
i suspect she was given full course of abx upon leaving the hospital.

## 2011-05-14 ENCOUNTER — Telehealth: Payer: Self-pay | Admitting: Physician Assistant

## 2011-05-14 DIAGNOSIS — R002 Palpitations: Secondary | ICD-10-CM

## 2011-05-14 NOTE — Telephone Encounter (Signed)
Left message on machine.

## 2011-05-14 NOTE — Telephone Encounter (Signed)
Pt called this evening c/o palpitations around 5pm - she is not sure if she is in atrial fibrillation or not, but suspects she may have had an episode. She was recently d/c from hospital for diverticulitis and wasn't sure if this had something to do with it. Earlier with her palpitations, she had mild left arm discomfort. She took her 6pm medication which includes Toprol XL 100mg  (newly increased dose) and felt that it helped to bring her symptoms down. Her HR has come back down under 100, and she feels like it is now her normal rhythm with PVCs that she is used to feeling. She no longer has left arm pain. Otherwise she feels well; no chest pain.   I advised patient that if her elevated heart rate returns, she can try taking 1/4 extra tablet of metoprolol (blood pressure is unknown). If elevated heart rate persists, may take an additional 1/4 tablet (up to 1/2 tablet total). If a-fib-like symptoms recur with left arm pain, advised patient to proceed to ER for eval. She expressed understanding and gratitude. She is also interested in pursuing cardiac monitoring as previously discussed with Dr. Jens Som. Will forward to him & his RN for consideration of Cardionet & further recs.

## 2011-05-15 NOTE — Telephone Encounter (Signed)
Schedule cardionet Olga Millers

## 2011-05-18 NOTE — Telephone Encounter (Signed)
Left message for pt, we should call to schedule monitor Destiny Dennis

## 2011-05-18 NOTE — Telephone Encounter (Signed)
Addended by: Freddi Starr on: 05/18/2011 12:32 PM   Modules accepted: Orders

## 2011-05-19 ENCOUNTER — Telehealth: Payer: Self-pay

## 2011-05-19 NOTE — Telephone Encounter (Signed)
[employer] will mail monitor to patient.

## 2011-06-01 ENCOUNTER — Telehealth: Payer: Self-pay | Admitting: *Deleted

## 2011-06-01 LAB — BASIC METABOLIC PANEL
BUN: 3 — ABNORMAL LOW
BUN: 3 — ABNORMAL LOW
CO2: 29
Chloride: 100
Chloride: 101
Chloride: 102
Creatinine, Ser: 0.66
Creatinine, Ser: 0.67
Creatinine, Ser: 0.68
Creatinine, Ser: 0.71
GFR calc Af Amer: >60
GFR calc Af Amer: >60
GFR calc Af Amer: >60
GFR calc non Af Amer: >60
GFR calc non Af Amer: >60
Glucose, Bld: 112 — ABNORMAL HIGH
Potassium: 3.1 — ABNORMAL LOW
Potassium: 3.7
Sodium: 134 — ABNORMAL LOW

## 2011-06-01 LAB — URINALYSIS, ROUTINE W REFLEX MICROSCOPIC
Bilirubin Urine: NEGATIVE
Ketones, ur: NEGATIVE
Nitrite: NEGATIVE
pH: 6.5

## 2011-06-01 LAB — CBC
HCT: 27.2 — ABNORMAL LOW
HCT: 30.3 — ABNORMAL LOW
Hemoglobin: 10.5 — ABNORMAL LOW
Hemoglobin: 10.7 — ABNORMAL LOW
MCV: 86.1
MCV: 86.6
MCV: 86.7
Platelets: 199
Platelets: 212
Platelets: 317
RBC: 3.4 — ABNORMAL LOW
RBC: 4.48
RDW: 13.9
RDW: 14.1 — ABNORMAL HIGH
WBC: 6.2
WBC: 8.7

## 2011-06-01 LAB — COMPREHENSIVE METABOLIC PANEL
ALT: 34
AST: 28
Albumin: 4.3
Alkaline Phosphatase: 85
CO2: 27
Chloride: 100
GFR calc Af Amer: >60
GFR calc non Af Amer: >60
Potassium: 4.4
Total Bilirubin: 0.8

## 2011-06-01 LAB — TYPE AND SCREEN: ABO/RH(D): O POS

## 2011-06-01 LAB — DIFFERENTIAL
Basophils Absolute: 0
Basophils Relative: 0
Eosinophils Absolute: 0.1
Eosinophils Relative: 2
Monocytes Absolute: 0.4

## 2011-06-01 LAB — PREGNANCY, URINE: Preg Test, Ur: NEGATIVE

## 2011-06-01 LAB — ABO/RH: ABO/RH(D): O POS

## 2011-06-01 NOTE — Telephone Encounter (Signed)
Spoke with pt, monitor alert shows atrial fib. Dr Jens Som made aware, pt to continue asa and toprol. Will cont monitor and call with any problems Destiny Dennis

## 2011-06-10 ENCOUNTER — Encounter: Payer: Self-pay | Admitting: Internal Medicine

## 2011-06-10 ENCOUNTER — Ambulatory Visit (INDEPENDENT_AMBULATORY_CARE_PROVIDER_SITE_OTHER): Payer: BC Managed Care – PPO | Admitting: Internal Medicine

## 2011-06-10 DIAGNOSIS — E669 Obesity, unspecified: Secondary | ICD-10-CM | POA: Insufficient documentation

## 2011-06-10 DIAGNOSIS — K219 Gastro-esophageal reflux disease without esophagitis: Secondary | ICD-10-CM

## 2011-06-10 DIAGNOSIS — K5732 Diverticulitis of large intestine without perforation or abscess without bleeding: Secondary | ICD-10-CM

## 2011-06-10 MED ORDER — PEG-KCL-NACL-NASULF-NA ASC-C 100 G PO SOLR: 1.0000 | Freq: Once | ORAL | Status: DC

## 2011-06-10 NOTE — Assessment & Plan Note (Signed)
She is aware of the problem and health risks. She's had multiple orthopedic problems and a lot of back problems now and so she is unable to exercise like she wants. I did explain that caloric intake is still a major issue. She eats out a lot and we discussed reducing that. She has a goal to lose 10 pounds in 6 months.

## 2011-06-10 NOTE — Progress Notes (Signed)
  Subjective:    Patient ID: Destiny Dennis, female    DOB: 21-Dec-1960, 50 y.o.   MRN: [id number]  HPI This pleasant lady previously seen for GERD problems had diverticulitis. Sigmoid colon diverticulitis diagnosed by CT, she was hospitalized in early September treated with antibiotics. She is completely recovered. She is using fiber supplements and is moving her bowels several times a day. Prior to that she might have had some mild constipation. She has never had a colonoscopy. She will notice some bright red blood on the tissue paper she has hard stools and straining to at times. This is overall unchanged.  She uses Pepcid Complete intermittently for GERD.   She realizes she is obese. She has a great deal of difficulty in exercising and cannot do that anymore. Back and orthopedic problems are the reason. She does eat out frequently going to travel baseball games for her son's most of the year. She realizes that is a potential problem contributor to her weight problems. He had a conversation about all this today.   Review of Systems As above    Objective:   Physical Exam Well developed well nourished obese no acute distress Lungs clear Heart S1-S2 no murmurs or gallops Abdomen is soft and nontender without masses, bowel sounds are present She is alert and oriented x3       Assessment & Plan:

## 2011-06-10 NOTE — Assessment & Plan Note (Signed)
Likely run-of-the-mill diverticulitis. Since she's never had a colonoscopy it is reasonable to do that to make sure there are no polyps or other triggers for the diverticulitis and to ensure resolution. She understands the risks benefits indications and agrees to proceed.

## 2011-06-10 NOTE — Patient Instructions (Addendum)
Please obtain a flu shot before the end of November for primary care or at an urgent care or drug store. Obesity information given to you to read. You have been scheduled for a Colonoscopy with separate instructions given. Your prep kit has been sent to your pharmacy for you to pick up.

## 2011-06-10 NOTE — Assessment & Plan Note (Signed)
Using Pepcid Complete intermittently. Weight loss and diet changes discussed.

## 2011-07-02 ENCOUNTER — Other Ambulatory Visit: Payer: Self-pay | Admitting: Dermatology

## 2011-07-16 ENCOUNTER — Encounter: Payer: Self-pay | Admitting: Internal Medicine

## 2011-07-16 ENCOUNTER — Ambulatory Visit (AMBULATORY_SURGERY_CENTER): Payer: BC Managed Care – PPO | Admitting: Internal Medicine

## 2011-07-16 VITALS — BP 121/71 | HR 69 | Temp 98.3°F | Resp 20 | Ht 71.0 in | Wt 248.0 lb

## 2011-07-16 DIAGNOSIS — K5732 Diverticulitis of large intestine without perforation or abscess without bleeding: Secondary | ICD-10-CM

## 2011-07-16 DIAGNOSIS — K5730 Diverticulosis of large intestine without perforation or abscess without bleeding: Secondary | ICD-10-CM

## 2011-07-16 HISTORY — PX: COLONOSCOPY: SHX174

## 2011-07-16 MED ORDER — SODIUM CHLORIDE 0.9 % IV SOLN: 500.0000 mL | INTRAVENOUS | Status: DC

## 2011-07-16 NOTE — Patient Instructions (Signed)
You have severe diverticulosis of the sigmoid colon. No diverticulitis (inflammation and infection) now. It sometimes recurs, but hopefully not. Please read the literature provided and follow th diet instructions. If you develop recurrent lower abdominal pain seek medical attention with Dr. Cato Mulligan or me as it could be recurrent diverticulitis. If you end up having multiple attacks, surgery can cure the problem but we hope to avoid that. Iva Boop, MD, [facility]

## 2011-07-16 NOTE — Progress Notes (Signed)
Patient did not experience any of the following events: a burn prior to discharge; a fall within the facility; wrong site/side/patient/procedure/implant event; or a hospital transfer or hospital admission upon discharge from the facility. (G8907) Patient did not have preoperative order for IV antibiotic SSI prophylaxis. (G8918)  

## 2011-07-16 NOTE — Progress Notes (Signed)
Pt as a hx of atrial fibrillation.  She remained in the sinus rhythm t/o procedure KW

## 2011-07-17 ENCOUNTER — Telehealth: Payer: Self-pay

## 2011-07-17 NOTE — Telephone Encounter (Signed)
Left message

## 2011-07-28 ENCOUNTER — Other Ambulatory Visit: Payer: Self-pay | Admitting: Dermatology

## 2011-08-15 ENCOUNTER — Telehealth: Payer: Self-pay | Admitting: Gastroenterology

## 2011-08-15 NOTE — Telephone Encounter (Signed)
On call note at 1810. Pt with worsening LLQ pain today that is just the same as prior diverticulitis. Not severe but pt worried that it will worsen as it did in 04/2011 leading to hospitalization. Reviewed 04/2011 DC summary, CG office visit and colonoscopy. Advised rest, light diet, Augmentin 875 mg po bid for 10 days. May use Tylenol or Advil for pain control. Contact us or present to ED if symptoms worsen and call office Monday to schedule follow up appt with CG.

## 2011-08-17 ENCOUNTER — Encounter: Payer: Self-pay | Admitting: *Deleted

## 2011-08-17 NOTE — Telephone Encounter (Signed)
Line busy unable to reach patient. 

## 2011-08-17 NOTE — Telephone Encounter (Signed)
Scheduled patient on 08/27/11 at 10:15 AM. Letter mailed to patient.

## 2011-08-17 NOTE — Telephone Encounter (Signed)
She needs a follow-up appointment with me next 1-2 weeks

## 2011-08-19 NOTE — Telephone Encounter (Signed)
I have left a detailed message for the patient that we are trying to reach her about an appt.  I have also left her the appt info for 08/27/11.  She was also mailed a letter.

## 2011-08-19 NOTE — Telephone Encounter (Signed)
Let's re-call her before week ends

## 2011-08-25 ENCOUNTER — Ambulatory Visit: Payer: BC Managed Care – PPO | Admitting: Cardiology

## 2011-08-27 ENCOUNTER — Ambulatory Visit: Payer: BC Managed Care – PPO | Admitting: Internal Medicine

## 2011-08-31 ENCOUNTER — Other Ambulatory Visit: Payer: Self-pay | Admitting: Obstetrics and Gynecology

## 2011-08-31 DIAGNOSIS — Z1231 Encounter for screening mammogram for malignant neoplasm of breast: Secondary | ICD-10-CM

## 2011-09-08 ENCOUNTER — Ambulatory Visit: Payer: BC Managed Care – PPO | Admitting: Internal Medicine

## 2011-09-11 ENCOUNTER — Ambulatory Visit: Payer: BC Managed Care – PPO | Admitting: Cardiology

## 2011-09-15 ENCOUNTER — Ambulatory Visit: Payer: BC Managed Care – PPO

## 2011-09-17 ENCOUNTER — Encounter: Payer: Self-pay | Admitting: Cardiology

## 2011-09-17 ENCOUNTER — Ambulatory Visit (INDEPENDENT_AMBULATORY_CARE_PROVIDER_SITE_OTHER): Payer: BC Managed Care – PPO | Admitting: Cardiology

## 2011-09-17 DIAGNOSIS — R002 Palpitations: Secondary | ICD-10-CM

## 2011-09-17 DIAGNOSIS — I4891 Unspecified atrial fibrillation: Secondary | ICD-10-CM

## 2011-09-17 DIAGNOSIS — Z79899 Other long term (current) drug therapy: Secondary | ICD-10-CM

## 2011-09-17 MED ORDER — FLECAINIDE ACETATE 100 MG PO TABS: 100.0000 mg | ORAL_TABLET | Freq: Two times a day (BID) | ORAL | Status: DC

## 2011-09-17 NOTE — Assessment & Plan Note (Signed)
I reviewed the patient's monitor with Dr. Johney Frame today. She has evidence of both atrial fibrillation and flutter. These rhythm disturbances are symptomatic and bothersome. I will try flecainide 100 mg by mouth twice a day to see if this helps. If not we could refer for atrial fibrillation/flutter ablation in the future. Plan exercise treadmill 5-7 days after initiating flecainide to exclude exercise-induced ventricular tachycardia. She has no embolic risk factors. Continue aspirin.

## 2011-09-17 NOTE — Patient Instructions (Signed)
Your physician recommends that you schedule a follow-up appointment in: 4-6 WEEKS  Your physician has requested that you have an exercise tolerance test. For further information please visit [email or web address] Please also follow instruction sheet, as given.WITH PA OR NP IN 5 TO 7 DAYS  START FLECAINIDE 100 MG ONE TABLET TWICE DAILY

## 2011-09-17 NOTE — Assessment & Plan Note (Signed)
There is also a component of PVCs. Continue beta blocker.

## 2011-09-17 NOTE — Progress Notes (Signed)
Destiny Dennis:[id number] female for fu of PVCs, palpitations and remote history of atrial fibrillation. Myoview in 2004 showed EF 54 and no ischemia. Echo in 2008 showed normal LV function and mild LAE. Echocardiogram in July 2012 showed normal LV function and moderate left atrial enlargement. Patient had CardioNet in November of 2012 that showed paroxysmal atrial fibrillation and flutter. Since I last saw her in Sept 2012, she did have recurrent palpitations prompting the above monitor. She continues to have both episodes of PVCs and bouts of atrial fibrillation. She otherwise does not have dyspnea on exertion, orthopnea, PND, pedal edema, syncope or exertional chest pain.  Current Outpatient Prescriptions  Medication Sig Dispense Refill  . aspirin 81 MG tablet Take 81 mg by mouth daily.        . calcium-vitamin D (OSCAL WITH D) 250-125 MG-UNIT per tablet Take 1 tablet by mouth daily.      . Cholecalciferol (VITAMIN D3) 5000 UNITS TABS Take 5,000 Units by mouth daily.      Marland Kitchen co-enzyme Q-10 50 MG capsule Take 50 mg by mouth daily.      . diazepam (VALIUM) 5 MG tablet Take 1 tablet by mouth At bedtime.      . famotidine-calcium carbonate-magnesium hydroxide (PEPCID COMPLETE) 10-800-165 MG CHEW Chew 1 tablet by mouth daily as needed.        . fish oil-omega-3 fatty acids 1000 MG capsule Take 2 g by mouth daily.      . Ibuprofen (MOTRIN PO) Take by mouth as needed.        . Magnesium Citrate 100 MG TABS Take 100 mg by mouth daily.      . metoprolol (TOPROL-XL) 100 MG 24 hr tablet Take 1 tablet (100 mg total) by mouth daily.  90 tablet  3  . Multiple Vitamin (MULTIVITAMIN) tablet Take 1 tablet by mouth daily.      Marland Kitchen PROMETRIUM 100 MG capsule Take 100 mg by mouth daily.       . vitamin E 100 UNIT capsule Take 100 Units by mouth daily.      Marland Kitchen VIVELLE-DOT 0.1 MG/24HR Place 1 patch onto the skin 2 (two) times a week.          Past Medical History  Diagnosis Date  . Atrial fibrillation 03/05/2007  . 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 GERD 12/23/2009  . Sialoadenitis 03/18/2010  . Hyperlipidemia   . SVT (supraventricular tachycardia)     brief  . PVC (premature ventricular contraction)   . History of MRSA infection 2008  . Allergic rhinitis   . Cervical disc disease   . Benign fundic gland polyps of stomach   . Hiatal hernia   . Diverticulitis     CT Scan   . Hypertension   . Hemorrhoids     Past Surgical History  Procedure Date  . Knee surgery     x 9 Left Knee  . Cholecystectomy, laparoscopic 2008  . Upper gastrointestinal endoscopy 01/30/2010    hiatal hernia, fundic gland polyps  . Shoulder surgery     Right   . Elbow surgery     Right  . Foot surgery     Right   . Cervical fusion   . Cesarean section     x 2  . Wrist surgery     Left   . Cholecystectomy   . Flexible sigmoidoscopy     1990's  . Colonoscopy 07/16/11    diverticulosis    History  Social History  . Marital Status: Married    Spouse Name: N/A    Number of Children: 2  . Years of Education: N/A   Occupational History  . [employer]     Workers Comp   Social History Main Topics  . Smoking status: Never Smoker   . Smokeless tobacco: Never Used  . Alcohol Use: No  . Drug Use: No  . Sexually Active: Not on file     has periods q 3-4 months- no possiblity pregnant per pt.07-16-11   Other Topics Concern  . Not on file   Social History Narrative   Daily Caffeine     ROS: no fevers or chills, productive cough, hemoptysis, dysphasia, odynophagia, melena, hematochezia, dysuria, hematuria, rash, seizure activity, orthopnea, PND, pedal edema, claudication. Remaining systems are negative.  Physical Exam: Well-developed well-nourished in no acute distress.  Skin is warm and dry.  HEENT is normal.  Neck is supple. No thyromegaly.  Chest is clear to auscultation with normal expansion.  Cardiovascular exam is regular rate and rhythm.  Abdominal exam nontender or distended. No masses  palpated. Extremities show no edema. neuro grossly intact  ECG sinus rhythm at a rate of 62. No significant ST changes.

## 2011-09-22 ENCOUNTER — Ambulatory Visit: Payer: BC Managed Care – PPO | Admitting: Internal Medicine

## 2011-09-23 ENCOUNTER — Ambulatory Visit (INDEPENDENT_AMBULATORY_CARE_PROVIDER_SITE_OTHER): Payer: BC Managed Care – PPO | Admitting: Physician Assistant

## 2011-09-23 DIAGNOSIS — Z79899 Other long term (current) drug therapy: Secondary | ICD-10-CM

## 2011-09-23 DIAGNOSIS — I4891 Unspecified atrial fibrillation: Secondary | ICD-10-CM

## 2011-09-23 NOTE — Progress Notes (Signed)
   Exercise Treadmill Test  Pre-Exercise Testing Evaluation Rhythm: sinus bradycardia  Rate: 58   PR:  .19 QRS:  .10  QT:  43 QTc: 42     Test  Exercise Tolerance Test Ordering MD: Olga Millers, MD  Interpreting MD:  Tereso Newcomer PA-C  Unique Test No: 1  Treadmill:  1  Indication for ETT: A-FIB  Contraindication to ETT: No   Stress Modality: exercise - treadmill  Cardiac Imaging Performed: non   Protocol: standard Bruce - maximal  Max BP:  151/75  Max MPHR (bpm):  172 85% MPR (bpm):  146  MPHR obtained (bpm):  110 % MPHR obtained:  63%  Reached 85% MPHR (min:sec):  n/a Total Exercise Time (min-sec):  12:00  Workload in METS:  7.5 Borg Scale: 13  Reason ETT Terminated:  patient's desire to stop    ST Segment Analysis At Rest: normal ST segments - no evidence of significant ST depression With Exercise: non-specific ST changes  Other Information Arrhythmia:  No Angina during ETT:  absent (0) Quality of ETT:  non-diagnostic  ETT Interpretation:  normal - no evidence of ischemia by ST analysis at submaximal exercise  Comments: Poor exercise tolerance. No chest pain. Flat BP response to exercise. No significant ST-T changes to suggest ischemia at submaximal exercise.  No exercise induced ventricular arrhythmias.  Recommendations: Non-diagnostic test for ischemic heart disease. No exercise induced ventricular arrhthymias. Continue flecainide for now and follow up with Dr. Olga Millers as directed.  Tereso Newcomer, PA-C  11:42 AM 09/23/2011

## 2011-09-24 ENCOUNTER — Ambulatory Visit
Admission: RE | Admit: 2011-09-24 | Discharge: 2011-09-24 | Disposition: A | Discharge status: Home / Self Care | Payer: BC Managed Care – PPO | Source: Ambulatory Visit | Attending: Obstetrics and Gynecology | Admitting: Obstetrics and Gynecology

## 2011-09-24 ENCOUNTER — Telehealth: Payer: Self-pay | Admitting: Cardiology

## 2011-09-24 DIAGNOSIS — Z1231 Encounter for screening mammogram for malignant neoplasm of breast: Secondary | ICD-10-CM

## 2011-09-24 NOTE — Telephone Encounter (Signed)
Decrease metoprolol to 50 mg daily. Destiny Dennis

## 2011-09-24 NOTE — Telephone Encounter (Signed)
SPOKE WITH PT  MAIN COMPLAINT IS EXTREME FATIGUE SINCE STARTING FLECAINIDE  100 MG BID  HEART RATE THIS AM  WAS 44 AT THIS TIME IS RUNNING IN THE 60 'S  PT AWARE WILL FORWARD TO DR Jens Som FOR REVIEW INSTRUCTED  IF HEART RATE  BELOW  50 TO HOLD  FLECAINIDE  HAS TAKEN METOPROLOL  FOR YEARS WITH NO PROBLEMS PT VERBALIZED UNDERSTANDING./CY

## 2011-09-24 NOTE — Telephone Encounter (Signed)
New problem Pt said her heart rate is low at 45. She said she is taking flecainide. She said she if really tired. Please call her back

## 2011-09-29 NOTE — Telephone Encounter (Signed)
Spoke with pt, she is not having any more problems with the fatique. She reports occ feelings of fatique but not like before. She will keep her medicine the same and call back with further problems

## 2011-10-02 ENCOUNTER — Ambulatory Visit (INDEPENDENT_AMBULATORY_CARE_PROVIDER_SITE_OTHER): Payer: BC Managed Care – PPO | Admitting: Internal Medicine

## 2011-10-02 ENCOUNTER — Encounter: Payer: Self-pay | Admitting: Internal Medicine

## 2011-10-02 VITALS — BP 118/74 | HR 80 | Ht 71.5 in | Wt 255.0 lb

## 2011-10-02 DIAGNOSIS — E669 Obesity, unspecified: Secondary | ICD-10-CM

## 2011-10-02 DIAGNOSIS — K5732 Diverticulitis of large intestine without perforation or abscess without bleeding: Secondary | ICD-10-CM

## 2011-10-02 MED ORDER — AMOXICILLIN-POT CLAVULANATE 875-125 MG PO TABS: 1.0000 | ORAL_TABLET | Freq: Two times a day (BID) | ORAL | Status: AC

## 2011-10-02 NOTE — Assessment & Plan Note (Addendum)
Recurrent spell in December 2012 seems likely. Not may have been triggered. She will avoid those. I provided a prescription for Augmentin 875 mg twice a day to be used if she develops recurrent symptoms. We discussed that it would need to be a day or 2 if problems before using it and she should notify me. She travels quite a bit with her sons travel baseball and could be out of town quite a bit so I think it's reasonable for her to keep this on hand.  If she develops recurrent episodes, elective segmental colon resection is an option. She does not need that at this time but we discussed the likelihood of recurrence, how that's unpredictable, and will observe over time.

## 2011-10-02 NOTE — Progress Notes (Signed)
  Subjective:    Patient ID: Destiny Dennis, female    DOB: [birth year], 51 y.o.   MRN: [id number]  HPI This pleasant middle-aged white woman is here for followup of recurrent diverticulitis. She had what we think was her first episode in the fall. She had a colonoscopy showing sigmoid diverticulosis after that. In December, 2012, she called with about 2-1/2 days of left lower quadrant pain that was similar to her previous diverticulitis. Augmentin 875 mg twice a day was called in. And she took a 10 day course with relatively prompt relief of symptoms. Over the Christmas holidays she had been eating a fair amount of nuts and thinks that may have triggered. She says this triggers problems in her brother who also has diverticulosis and diverticulitis. She has since stopped eating those and is on a high fiber diet and has no complaints today. Past medical history, medications, allergies are reviewed in the electronic medical record today.  Review of Systems As above    Objective:   Physical Exam  well-nourished obese no acute distress Abdomen soft nontender no masses bowel sounds present       Assessment & Plan:

## 2011-10-02 NOTE — Patient Instructions (Signed)
Your prescription(s) has(have) been sent to your pharmacy for you to pick up (Augmenton). Follow up to see Dr. Leone Payor as needed.

## 2011-10-02 NOTE — Assessment & Plan Note (Signed)
She has been exercising in trying to lose weight but has been started on flecainide for atrial fibrillation and indicates that she has gained weight since starting that. She is encouraged to at least keep exercising as that'll help keep her fit.

## 2011-10-05 ENCOUNTER — Telehealth: Payer: Self-pay | Admitting: Cardiology

## 2011-10-05 NOTE — Telephone Encounter (Signed)
New Msg: Pt calling wanting to speak with nurse/MD regarding pt new medication, flecainide, pt was put on. Pt c/o feeling lightheaded and dizzy. Pt stated her PVC had gone away and now they have come back with this medicine. Pt stated symptoms have been present 2-3 wks since pt started taking medication. Pt wanted to know if MD need to change pt medication.  Please return pt call to discuss further.

## 2011-10-05 NOTE — Telephone Encounter (Signed)
10/05/11--pt calling stating she again is having problems with flecainide--she will go two days and feel good--then she'll wake up the next day and after taking flecainide start having PVC's and dizziness--advised dr Jens Som nor debra in office today but i would send a message to both and if you do not hear anything by 2/19, please call back--pt agrees--nt

## 2011-10-05 NOTE — Telephone Encounter (Signed)
Dc flecanide, arrange eval with dr allred for possible afib/flutter ablation Destiny Dennis

## 2011-10-06 NOTE — Telephone Encounter (Signed)
Spoke with pt, aware to stop flecainide. appt scheduled with dr Johney Frame

## 2011-10-06 NOTE — Telephone Encounter (Signed)
F/U  Patient requesting return call from Nurse DM regarding discontinuation of Flecanide RX, She can be reached on cell# [phone]

## 2011-10-16 ENCOUNTER — Ambulatory Visit (INDEPENDENT_AMBULATORY_CARE_PROVIDER_SITE_OTHER): Payer: BC Managed Care – PPO | Admitting: Family

## 2011-10-16 ENCOUNTER — Encounter: Payer: Self-pay | Admitting: Family

## 2011-10-16 VITALS — BP 128/80 | HR 73 | Temp 98.3°F

## 2011-10-16 DIAGNOSIS — I4891 Unspecified atrial fibrillation: Secondary | ICD-10-CM

## 2011-10-16 DIAGNOSIS — J019 Acute sinusitis, unspecified: Secondary | ICD-10-CM

## 2011-10-16 MED ORDER — AMOXICILLIN 500 MG PO TABS: 1000.0000 mg | ORAL_TABLET | Freq: Two times a day (BID) | ORAL | Status: AC

## 2011-10-16 NOTE — Patient Instructions (Addendum)
1. Coricidin HBP  Sinusitis Sinuses are air pockets within the bones of your face. The growth of bacteria within a sinus leads to infection. The infection prevents the sinuses from draining. This infection is called sinusitis. SYMPTOMS  There will be different areas of pain depending on which sinuses have become infected.  The maxillary sinuses often produce pain beneath the eyes.   Frontal sinusitis may cause pain in the middle of the forehead and above the eyes.  Other problems (symptoms) include:  Toothaches.   Colored, pus-like (purulent) drainage from the nose.   Swelling, warmth, and tenderness over the sinus areas may be signs of infection.  TREATMENT  Sinusitis is most often determined by an exam.X-rays may be taken. If x-rays have been taken, make sure you obtain your results or find out how you are to obtain them. Your caregiver may give you medications (antibiotics). These are medications that will help kill the bacteria causing the infection. You may also be given a medication (decongestant) that helps to reduce sinus swelling.  HOME CARE INSTRUCTIONS   Only take over-the-counter or prescription medicines for pain, discomfort, or fever as directed by your caregiver.   Drink extra fluids. Fluids help thin the mucus so your sinuses can drain more easily.   Applying either moist heat or ice packs to the sinus areas may help relieve discomfort.   Use saline nasal sprays to help moisten your sinuses. The sprays can be found at your local drugstore.  SEEK IMMEDIATE MEDICAL CARE IF:  You have a fever.   You have increasing pain, severe headaches, or toothache.   You have nausea, vomiting, or drowsiness.   You develop unusual swelling around the face or trouble seeing.  MAKE SURE YOU:   Understand these instructions.   Will watch your condition.   Will get help right away if you are not doing well or get worse.  Document Released: 08/03/2005 Document Revised:  04/15/2011 Document Reviewed: 03/02/2007 [facility] Patient Information 2012 [place], [place].

## 2011-10-16 NOTE — Progress Notes (Signed)
Subjective:    Patient ID: Destiny Dennis, female    DOB: 1961/03/06, 51 y.o.   MRN: [id number]  HPI 51 year old white female, nonsmoker, patient of Dr. Cato Mulligan is in today with complaints as sore throat, congestion, sinus pressure and pain, ear pain is single one on for 8 days. Her symptoms are worsening at this point. She has a past medical history of atrial fibrillation and therefore she's been using saline to help her symptoms. She has not taken any other medicines at this point. She denies any lightheadedness, dizziness, chest pain, palpitations or shortness of breath or.   Review of Systems  Constitutional: Positive for fever, chills and fatigue.  HENT: Positive for ear pain, congestion, sore throat, sneezing, postnasal drip and sinus pressure.   Respiratory: Positive for cough.   Cardiovascular: Negative.   Musculoskeletal: Negative.   Skin: Negative.   Neurological: Negative.   Hematological: Negative.   Psychiatric/Behavioral: Negative.    Past Medical History  Diagnosis Date  . Atrial fibrillation 03/05/2007  . 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 GERD 12/23/2009  . Sialoadenitis 03/18/2010  . Hyperlipidemia   . SVT (supraventricular tachycardia)     brief  . PVC (premature ventricular contraction)   . History of MRSA infection 2008  . Allergic rhinitis   . Cervical disc disease   . Benign fundic gland polyps of stomach   . Hiatal hernia   . Diverticulitis     CT Scan   . Hypertension   . Hemorrhoids     History   Social History  . Marital Status: Married    Spouse Name: N/A    Number of Children: 2  . Years of Education: N/A   Occupational History  . [employer]     Workers Comp   Social History Main Topics  . Smoking status: Never Smoker   . Smokeless tobacco: Never Used  . Alcohol Use: No  . Drug Use: No  . Sexually Active: Not on file     has periods q 3-4 months- no possiblity pregnant per pt.07-16-11   Other Topics Concern  . Not  on file   Social History Narrative   Daily Caffeine     Past Surgical History  Procedure Date  . Knee surgery     x 9 Left Knee  . Cholecystectomy, laparoscopic 2008  . Upper gastrointestinal endoscopy 01/30/2010    hiatal hernia, fundic gland polyps  . Shoulder surgery     Right   . Elbow surgery     Right  . Foot surgery     Right   . Cervical fusion   . Cesarean section     x 2  . Wrist surgery     Left   . Cholecystectomy   . Flexible sigmoidoscopy     1990's  . Colonoscopy 07/16/2011    diverticulosis    Family History  Problem Relation Age of Onset  . Diabetes Maternal Grandfather   . Colon cancer Neg Hx   . Diverticulitis Brother     x 2  . Diverticulitis Paternal Grandmother     No Known Allergies  Current Outpatient Prescriptions on File Prior to Visit  Medication Sig Dispense Refill  . aspirin 81 MG tablet Take 81 mg by mouth daily.        . calcium-vitamin D (OSCAL WITH D) 250-125 MG-UNIT per tablet Take 1 tablet by mouth daily.      . Cholecalciferol (VITAMIN D3) 5000 UNITS TABS Take  5,000 Units by mouth daily.      Marland Kitchen co-enzyme Q-10 50 MG capsule Take 50 mg by mouth daily.      . diazepam (VALIUM) 5 MG tablet Take 1 tablet by mouth At bedtime.      . famotidine-calcium carbonate-magnesium hydroxide (PEPCID COMPLETE) 10-800-165 MG CHEW Chew 1 tablet by mouth daily as needed.        . fish oil-omega-3 fatty acids 1000 MG capsule Take 2 g by mouth daily.      . flecainide (TAMBOCOR) 100 MG tablet Take 1 tablet (100 mg total) by mouth 2 (two) times daily.  60 tablet  12  . Ibuprofen (MOTRIN PO) Take by mouth as needed.        . Magnesium Citrate 100 MG TABS Take 100 mg by mouth daily.      . metoprolol (TOPROL-XL) 100 MG 24 hr tablet Take 1 tablet (100 mg total) by mouth daily.  90 tablet  3  . Multiple Vitamin (MULTIVITAMIN) tablet Take 1 tablet by mouth daily.      Marland Kitchen PROMETRIUM 100 MG capsule Take 100 mg by mouth daily.       . vitamin E 100 UNIT  capsule Take 100 Units by mouth daily.      Marland Kitchen VIVELLE-DOT 0.1 MG/24HR Place 1 patch onto the skin 2 (two) times a week.         BP 128/80  Pulse 73  Temp(Src) 98.3 F (36.8 C) (Oral)  LMP 09/01/2012chart    Objective:   Physical Exam  Constitutional: She appears well-developed and well-nourished.  HENT:  Right Ear: External ear normal.  Left Ear: External ear normal.  Nose: Nose normal.  Mouth/Throat: Oropharynx is clear and moist.       Sinus tenderness to palpation of both maxillary sinuses.  Neck: Normal range of motion. Neck supple.  Cardiovascular: Normal rate, regular rhythm and normal heart sounds.   Pulmonary/Chest: Effort normal and breath sounds normal.  Abdominal: Soft. Bowel sounds are normal.  Skin: Skin is warm and dry.  Psychiatric: She has a normal mood and affect.          Assessment & Plan:  Assessment: Acute sinusitis, atrial fibrillation  Plan: Amoxicillin 500 mg 2 tabs by mouth twice a day x10 days. Coricidin HBP OTC as directed. Call the office if symptoms worsen or persist. Rest. Drink plenty of fluids. Recheck as scheduled, and when necessary.

## 2011-10-23 ENCOUNTER — Ambulatory Visit: Payer: BC Managed Care – PPO | Admitting: Cardiology

## 2011-10-31 ENCOUNTER — Telehealth: Payer: Self-pay | Admitting: Gastroenterology

## 2011-10-31 NOTE — Telephone Encounter (Signed)
She called with mild LLQ pains, pressure in her rectum with BM, temp to 100.8.  I recommended that she start the abx that Dr. Leone Payor had given her on the chance ths may happen again.  She knows to call if symptoms don't improve in 2-3 days or if she worsens.  I will pass this on to Dr. Leone Payor.

## 2011-11-01 NOTE — Telephone Encounter (Signed)
Please call her on 3/18 to see how she is doing and let me know

## 2011-11-02 NOTE — Telephone Encounter (Signed)
Patient reports she is better.  She had a BM today and no fever.  She is on the 3rd day of the antibiotics.  The pain has improved also.

## 2011-11-02 NOTE — Telephone Encounter (Signed)
Left message for patient to call back  

## 2011-11-05 ENCOUNTER — Ambulatory Visit: Payer: Self-pay | Admitting: Obstetrics and Gynecology

## 2011-11-09 ENCOUNTER — Encounter: Payer: Self-pay | Admitting: Internal Medicine

## 2011-11-09 ENCOUNTER — Ambulatory Visit (INDEPENDENT_AMBULATORY_CARE_PROVIDER_SITE_OTHER): Payer: BC Managed Care – PPO | Admitting: Internal Medicine

## 2011-11-09 ENCOUNTER — Encounter: Payer: Self-pay | Admitting: *Deleted

## 2011-11-09 VITALS — BP 132/82 | HR 59 | Ht 71.0 in | Wt 252.1 lb

## 2011-11-09 DIAGNOSIS — I4891 Unspecified atrial fibrillation: Secondary | ICD-10-CM

## 2011-11-09 LAB — CBC WITH DIFFERENTIAL/PLATELET
Basophils Absolute: 0 10*3/uL (ref 0.0–0.1)
Basophils Relative: 0.3 % (ref 0.0–3.0)
Eosinophils Relative: 3.2 % (ref 0.0–5.0)
HCT: 39.5 % (ref 36.0–46.0)
Hemoglobin: 13.2 g/dL (ref 12.0–15.0)
Lymphocytes Relative: 26.3 % (ref 12.0–46.0)
Monocytes Relative: 6.4 % (ref 3.0–12.0)
Neutro Abs: 5.2 10*3/uL (ref 1.4–7.7)
RBC: 4.49 Mil/uL (ref 3.87–5.11)
WBC: 8.1 10*3/uL (ref 4.5–10.5)

## 2011-11-09 LAB — BASIC METABOLIC PANEL
Calcium: 9.3 mg/dL (ref 8.4–10.5)
GFR: 86.11 mL/min (ref >60.00)
Potassium: 4.6 mEq/L (ref 3.5–5.1)
Sodium: 137 mEq/L (ref 135–145)

## 2011-11-09 MED ORDER — RIVAROXABAN 20 MG PO TABS: 20.0000 mg | ORAL_TABLET | Freq: Every day | ORAL | Status: DC

## 2011-11-09 NOTE — Assessment & Plan Note (Signed)
The patient has symptomatic paroxysmal atrial fibrillation and atrial flutter.  She has failed medical therapy with flecainide and metoprolol.  Therapeutic strategies for afib and atrial flutter including medicine and ablation were discussed in detail with the patient today. Risk, benefits, and alternatives to EP study and radiofrequency ablation were also discussed in detail today. These risks include but are not limited to stroke, bleeding, vascular damage, tamponade, perforation, damage to the esophagus, lungs, and other structures, pulmonary vein stenosis, worsening renal function, and death. The patient understands these risk and wishes to proceed. I will initiate xarelto 20mg  daily today.  We will therefore proceed with catheter ablation once the patient has been adequately anticoagulated.

## 2011-11-09 NOTE — Patient Instructions (Addendum)
Your physician has recommended that you have an ablation. Catheter ablation is a medical procedure used to treat some cardiac arrhythmias (irregular heartbeats). During catheter ablation, a long, thin, flexible tube is put into a blood vessel in your groin (upper thigh), or neck. This tube is called an ablation catheter. It is then guided to your heart through the blood vessel. Radio frequency waves destroy small areas of heart tissue where abnormal heartbeats may cause an arrhythmia to start. Please see the instruction sheet given to you today.   Your physician has recommended you make the following change in your medication:  1) Start Xarelto 20mg daily  

## 2011-11-09 NOTE — Progress Notes (Signed)
Primary Care Physician: Judie Petit, MD, MD Referring Physician:  Dr Louis Meckel is a 51 y.o. female with a h/o paroxysmal atrial fibrillation and atrial flutter who presents today for EP consultation.  She reports initially being diagnosed with atrial fibrillation 8 or 9 years ago after a miscarriage.  She did well until this past year.  She reports that over the years she would have "short episodes" lasting several hours at a time.  Over the past 6 months, she has had increasing frequency and duration in episodes of afib.  She finds that exercise will occasionally convert her back into sinus rhythm.  She is unaware of any triggers or precipitants for her afib.  She finds that if she goes to bed, episodes have always resolved by am.  She reports symptoms of palpitations and fatigue during her afib.  She reports occasional lightheadedness.  Presently, she finds that episodes occur 5-6 times per month, lasting for several hours at a time.  She was evaluated by Dr Jens Som who placed an event monitor.  This revealed atrial fibrillation as well as atrial flutter.  She was placed on flecainide.  She did not tolerate flecainide due to nausea and dizziness.  She also feels that her episodes increased.  Today, she denies symptoms of chest pain, shortness of breath, orthopnea, PND, lower extremity edema, presyncope, syncope, or neurologic sequela. The patient is tolerating medications without difficulties and is otherwise without complaint today.   Past Medical History  Diagnosis Date  . Atrial fibrillation 03/05/2007  . 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 GERD 12/23/2009  . Sialoadenitis 03/18/2010  . Hyperlipidemia   . PVC (premature ventricular contraction)   . History of MRSA infection 2008  . Allergic rhinitis   . Cervical disc disease   . Benign fundic gland polyps of stomach   . Hiatal hernia   . Diverticulitis     CT Scan   . Hypertension     pt doesnt think she  has high blood pressure  . Hemorrhoids    Past Surgical History  Procedure Date  . Knee surgery     x 9 Left Knee  . Cholecystectomy, laparoscopic 2008  . Upper gastrointestinal endoscopy 01/30/2010    hiatal hernia, fundic gland polyps  . Shoulder surgery     Right   . Elbow surgery     Right  . Foot surgery     Right   . Cervical fusion   . Cesarean section     x 2  . Wrist surgery     Left   . Cholecystectomy   . Flexible sigmoidoscopy     1990's  . Colonoscopy 07/16/2011    diverticulosis    Current Outpatient Prescriptions  Medication Sig Dispense Refill  . aspirin 81 MG tablet Take 81 mg by mouth daily.        . calcium-vitamin D (OSCAL WITH D) 250-125 MG-UNIT per tablet Take 1 tablet by mouth daily.      . Cholecalciferol (VITAMIN D3) 5000 UNITS TABS Take 5,000 Units by mouth daily.      Marland Kitchen co-enzyme Q-10 50 MG capsule Take 50 mg by mouth daily.      . diazepam (VALIUM) 5 MG tablet Take 1 tablet by mouth At bedtime.      . famotidine-calcium carbonate-magnesium hydroxide (PEPCID COMPLETE) 10-800-165 MG CHEW Chew 1 tablet by mouth daily as needed.        . fish oil-omega-3 fatty acids  1000 MG capsule Take 2 g by mouth daily.      . Ibuprofen (MOTRIN PO) Take by mouth as needed.        . Magnesium Citrate 100 MG TABS Take 100 mg by mouth daily.      . metoprolol (TOPROL-XL) 100 MG 24 hr tablet Take 1 tablet (100 mg total) by mouth daily.  90 tablet  3  . Multiple Vitamin (MULTIVITAMIN) tablet Take 1 tablet by mouth daily.      Marland Kitchen PROMETRIUM 100 MG capsule Take 100 mg by mouth daily.       . vitamin E 100 UNIT capsule Take 100 Units by mouth daily.      Marland Kitchen VIVELLE-DOT 0.1 MG/24HR Place 1 patch onto the skin 2 (two) times a week.         No Known Allergies  History   Social History  . Marital Status: Married    Spouse Name: N/A    Number of Children: 2  . Years of Education: N/A   Occupational History  . [employer]     Workers Comp   Social History  Main Topics  . Smoking status: Never Smoker   . Smokeless tobacco: Never Used  . Alcohol Use: No  . Drug Use: No  . Sexually Active: Not on file     has periods q 3-4 months- no possiblity pregnant per pt.07-16-11   Other Topics Concern  . Not on file   Social History Narrative   Daily Caffeine Pt lives in [place] with spouse.  She works as a workers Runner, broadcasting/film/video.    Family History  Problem Relation Age of Onset  . Diabetes Maternal Grandfather   . Colon cancer Neg Hx   . Diverticulitis Brother     x 2  . Diverticulitis Paternal Grandmother     ROS- All systems are reviewed and negative except as per the HPI above  Physical Exam: Filed Vitals:   11/09/11 1146  BP: 132/82  Pulse: 59  Height: 5\' 11"  (1.803 m)  Weight: 252 lb 1.9 oz (114.361 kg)    GEN- The patient is well appearing, alert and oriented x 3 today.   Head- normocephalic, atraumatic Eyes-  Sclera clear, conjunctiva pink Ears- hearing intact Oropharynx- clear Neck- supple, no JVP Lymph- no cervical lymphadenopathy Lungs- Clear to ausculation bilaterally, normal work of breathing Heart- Regular rate and rhythm, no murmurs, rubs or gallops, PMI not laterally displaced GI- soft, NT, ND, + BS Extremities- no clubbing, cyanosis, or edema MS- no significant deformity or atrophy Skin- no rash or lesion Psych- euthymic mood, full affect Neuro- strength and sensation are intact  EKG today reveals sinus rhythm 59 bpm PR 158, otherwise normal ekg  Assessment and Plan:

## 2011-11-11 ENCOUNTER — Telehealth: Payer: Self-pay | Admitting: Internal Medicine

## 2011-11-11 NOTE — Telephone Encounter (Signed)
Pt aware pharmacy ran the wrong [facility] # and it is covered for $30/month  She is going to pick up

## 2011-11-11 NOTE — Telephone Encounter (Signed)
To have ablation, was given rx for xarelto for five pills, insurance needs prior auth, said pharmacy faxed request, pt calling on status,  pls call [phone]

## 2011-11-12 ENCOUNTER — Other Ambulatory Visit: Payer: Self-pay | Admitting: *Deleted

## 2011-11-12 DIAGNOSIS — I4891 Unspecified atrial fibrillation: Secondary | ICD-10-CM

## 2011-11-19 ENCOUNTER — Other Ambulatory Visit: Payer: Self-pay | Admitting: *Deleted

## 2011-11-19 DIAGNOSIS — I4891 Unspecified atrial fibrillation: Secondary | ICD-10-CM

## 2011-11-23 ENCOUNTER — Telehealth: Payer: Self-pay | Admitting: Internal Medicine

## 2011-11-23 NOTE — Telephone Encounter (Signed)
New Problem:    Patient called in because she is experiencing progressively intensifying tingling in her feet and lower legs for the past 4-5 days and has a headache, and believes that it is due to her Rivaroxaban (XARELTO) 20 MG TABS and Avalox.  Please call back.

## 2011-11-23 NOTE — Telephone Encounter (Signed)
lmom for pt to call me back but that Dr Johney Frame does not think it is coming from the Xarelto.  If the symptoms persist she will need to follow up with her PCP

## 2011-12-02 ENCOUNTER — Encounter (HOSPITAL_COMMUNITY): Payer: Self-pay | Admitting: Pharmacy Technician

## 2011-12-07 ENCOUNTER — Encounter: Payer: Self-pay | Admitting: Obstetrics and Gynecology

## 2011-12-07 ENCOUNTER — Ambulatory Visit (INDEPENDENT_AMBULATORY_CARE_PROVIDER_SITE_OTHER): Payer: BC Managed Care – PPO | Admitting: Obstetrics and Gynecology

## 2011-12-07 VITALS — BP 148/80 | Resp 16 | Ht 72.0 in | Wt 248.0 lb

## 2011-12-07 DIAGNOSIS — N951 Menopausal and female climacteric states: Secondary | ICD-10-CM | POA: Insufficient documentation

## 2011-12-07 DIAGNOSIS — F411 Generalized anxiety disorder: Secondary | ICD-10-CM

## 2011-12-07 DIAGNOSIS — Z124 Encounter for screening for malignant neoplasm of cervix: Secondary | ICD-10-CM

## 2011-12-07 DIAGNOSIS — F419 Anxiety disorder, unspecified: Secondary | ICD-10-CM | POA: Insufficient documentation

## 2011-12-07 MED ORDER — ESTRADIOL 0.1 MG/24HR TD PTTW: 1.0000 | MEDICATED_PATCH | TRANSDERMAL | Status: DC

## 2011-12-07 MED ORDER — DIAZEPAM 5 MG/ML PO CONC: 5.0000 mg | Freq: Three times a day (TID) | ORAL | Status: AC | PRN

## 2011-12-07 MED ORDER — PROGESTERONE MICRONIZED 100 MG PO CAPS: 100.0000 mg | ORAL_CAPSULE | Freq: Every day | ORAL | Status: DC

## 2011-12-07 NOTE — Progress Notes (Addendum)
Regular Periods: no Mammogram: yes  Monthly Breast Ex.: yes Exercise: no  Tetanus < 10 years: no Seatbelts: yes  NI. Bladder Functn.: yes Abuse at home: no  Daily BM's: yes Stressful Work: yes  Healthy Diet: yes Sigmoid-Colonoscopy: DVT "06/2011"  Calcium: yes Medical problems this year: NONE   LAST PAP:09/20/2009 "WNL" pt wants pap today  Contraception: NONE  Mammogram: 09/24/2011 "WNL"  PCP: Dr.Bruce Swords  PMH: Diverticulitis   FMH: NO changes  Subjective:    Destiny Dennis is a 51 y.o. female, No obstetric history on file., who presents for an annual exam.     History   Social History  . Marital Status: Married    Spouse Name: N/A    Number of Children: 2  . Years of Education: N/A   Occupational History  . [employer]     Workers Comp   Social History Main Topics  . Smoking status: Never Smoker   . Smokeless tobacco: Never Used  . Alcohol Use: No  . Drug Use: No  . Sexually Active: None     has periods q 3-4 months- no possiblity pregnant per pt.07-16-11   Other Topics Concern  . None   Social History Narrative   Daily Caffeine Pt lives in [place] with spouse.  She works as a workers Runner, broadcasting/film/video.    Menstrual cycle:   LMP: Patient's last menstrual period was 04/18/2011.           Cycle: flow is light, with minimal cramping and every 4-6 months.  The following portions of the patient's history were reviewed and updated as appropriate: allergies, current medications, past family history, past medical history, past social history, past surgical history and problem list.  Review of Systems Pertinent items are noted in HPI. Breast:Negative for breast lump,nipple discharge or nipple retraction Gastrointestinal: Negative for abdominal pain, change in bowel habits or rectal bleeding Urinary:negative   Objective:    BP 148/80  Resp 16  Ht 6' (1.829 m)  Wt 248 lb (112.492 kg)  BMI 33.63 kg/m2  LMP 04/18/2011    Weight:  Wt Readings  from Last 1 Encounters:  12/07/11 248 lb (112.492 kg)          BMI: Body mass index is 33.63 kg/(m^2).  General Appearance: Alert, appropriate appearance for age. No acute distress HEENT: Grossly normal Neck / Thyroid: Supple, no masses, nodes or enlargement Lungs: clear to auscultation bilaterally Back: No CVA tenderness Breast Exam: No masses or nodes.No dimpling, nipple retraction or discharge. Cardiovascular: Regular rate and rhythm. S1, S2, no murmur Gastrointestinal: Soft, non-tender, no masses or organomegaly Pelvic Exam: Vulva and vagina appear normal. Bimanual exam reveals normal uterus and adnexa. Relaxation noted. Rectovaginal: normal rectal, no masses Lymphatic Exam: Non-palpable nodes in neck, clavicular, axillary, or inguinal regions Skin: no rash or abnormalities Neurologic: Normal gait and speech, no tremor  Psychiatric: Alert and oriented, appropriate affect.   Wet Prep:not applicable Urinalysis:not applicable UPT: Not done   Assessment:    Normal gyn exam Rapid heart rate  Menopausal symptoms Anxiety Healed rectal fissure Obesity Diverticulitis GERD   Plan:    mammogram pap smear return annually or prn STD screening: declined Contraception:bilateral tubal ligation Vivelle dot 0.1 twice each week Prometrium 100 mg qhs Valium 5 mg q 6 hours prn anxiety.  AVS      [facility] VMD

## 2011-12-07 NOTE — Patient Instructions (Signed)

## 2011-12-08 LAB — PAP IG W/ RFLX HPV ASCU

## 2011-12-11 ENCOUNTER — Other Ambulatory Visit (INDEPENDENT_AMBULATORY_CARE_PROVIDER_SITE_OTHER): Payer: BC Managed Care – PPO

## 2011-12-11 DIAGNOSIS — I4891 Unspecified atrial fibrillation: Secondary | ICD-10-CM

## 2011-12-11 LAB — CBC WITH DIFFERENTIAL/PLATELET
Basophils Absolute: 0 10*3/uL (ref 0.0–0.1)
Eosinophils Absolute: 0.2 10*3/uL (ref 0.0–0.7)
Lymphocytes Relative: 29.1 % (ref 12.0–46.0)
Lymphs Abs: 2.1 10*3/uL (ref 0.7–4.0)
Monocytes Relative: 7.2 % (ref 3.0–12.0)
Platelets: 246 10*3/uL (ref 150.0–400.0)
RDW: 14.5 % (ref 11.5–14.6)

## 2011-12-11 LAB — BASIC METABOLIC PANEL
BUN: 15 mg/dL (ref 6–23)
Calcium: 8.9 mg/dL (ref 8.4–10.5)
Creatinine, Ser: 0.8 mg/dL (ref 0.4–1.2)
GFR: 84.79 mL/min (ref >60.00)
Glucose, Bld: 93 mg/dL (ref 70–99)

## 2011-12-14 ENCOUNTER — Encounter: Payer: Self-pay | Admitting: *Deleted

## 2011-12-17 ENCOUNTER — Ambulatory Visit (HOSPITAL_COMMUNITY)
Admission: RE | Admit: 2011-12-17 | Discharge: 2011-12-17 | Disposition: A | Discharge status: Home / Self Care | Payer: BC Managed Care – PPO | Source: Ambulatory Visit | Attending: Cardiology | Admitting: Cardiology

## 2011-12-17 ENCOUNTER — Encounter (HOSPITAL_COMMUNITY)
Admission: RE | Disposition: A | Discharge status: Home / Self Care | Payer: Self-pay | Source: Ambulatory Visit | Attending: Cardiology

## 2011-12-17 ENCOUNTER — Encounter (HOSPITAL_COMMUNITY): Payer: Self-pay | Admitting: *Deleted

## 2011-12-17 DIAGNOSIS — I4891 Unspecified atrial fibrillation: Secondary | ICD-10-CM | POA: Insufficient documentation

## 2011-12-17 DIAGNOSIS — I4892 Unspecified atrial flutter: Secondary | ICD-10-CM | POA: Insufficient documentation

## 2011-12-17 HISTORY — PX: TEE WITHOUT CARDIOVERSION: SHX5443

## 2011-12-17 SURGERY — ECHOCARDIOGRAM, TRANSESOPHAGEAL
Anesthesia: Moderate Sedation

## 2011-12-17 MED ORDER — BENZOCAINE 20 % MT SOLN: 1.0000 "application " | OROMUCOSAL | Status: DC | PRN

## 2011-12-17 MED ORDER — SODIUM CHLORIDE 0.9 % IV SOLN: 250.0000 mL | INTRAVENOUS | Status: DC | PRN

## 2011-12-17 MED ORDER — SODIUM CHLORIDE 0.9 % IV SOLN
250.0000 mL | INTRAVENOUS | Status: DC | PRN
  Administered 2011-12-17: 500 mL via INTRAVENOUS

## 2011-12-17 MED ORDER — BUTAMBEN-TETRACAINE-BENZOCAINE 2-2-14 % EX AERO
INHALATION_SPRAY | CUTANEOUS | Status: DC | PRN
  Administered 2011-12-17: 2 via TOPICAL

## 2011-12-17 MED ORDER — MIDAZOLAM HCL 10 MG/2ML IJ SOLN
INTRAMUSCULAR | Status: AC
  Filled 2011-12-17: qty 4

## 2011-12-17 MED ORDER — FENTANYL CITRATE 0.05 MG/ML IJ SOLN: 250.0000 ug | Freq: Once | INTRAMUSCULAR | Status: DC

## 2011-12-17 MED ORDER — MIDAZOLAM HCL 10 MG/2ML IJ SOLN
INTRAMUSCULAR | Status: DC | PRN
  Administered 2011-12-17: 3 mg via INTRAVENOUS
  Administered 2011-12-17 (×2): 2 mg via INTRAVENOUS

## 2011-12-17 MED ORDER — FENTANYL CITRATE 0.05 MG/ML IJ SOLN
INTRAMUSCULAR | Status: AC
  Filled 2011-12-17: qty 4

## 2011-12-17 MED ORDER — SODIUM CHLORIDE 0.45 % IV SOLN: INTRAVENOUS | Status: DC

## 2011-12-17 MED ORDER — SODIUM CHLORIDE 0.9 % IJ SOLN: 3.0000 mL | Freq: Two times a day (BID) | INTRAMUSCULAR | Status: DC

## 2011-12-17 MED ORDER — MIDAZOLAM HCL 10 MG/2ML IJ SOLN: 10.0000 mg | Freq: Once | INTRAMUSCULAR | Status: DC

## 2011-12-17 MED ORDER — SODIUM CHLORIDE 0.9 % IJ SOLN: 3.0000 mL | INTRAMUSCULAR | Status: DC | PRN

## 2011-12-17 MED ORDER — FENTANYL CITRATE 0.05 MG/ML IJ SOLN
INTRAMUSCULAR | Status: DC | PRN
  Administered 2011-12-17 (×3): 25 ug via INTRAVENOUS

## 2011-12-17 NOTE — Progress Notes (Signed)
  Echocardiogram Echocardiogram Transesophageal has been performed.  Destiny Dennis A 12/17/2011, 1:53 PM

## 2011-12-17 NOTE — CV Procedure (Signed)
Procedure: TEE  Indication: Pre-atrial fibrillation ablation  Sedation: 7 mg IV versed, 100 mcg IV Fentanyl  Findings: Please see echo section of the chart for full details.  Normal LV and RV size and systolic function.  No LA/LA appendage thrombus.  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   No complications.   Marca Ancona 12/17/2011 1:47 PM

## 2011-12-17 NOTE — H&P (Signed)
Dr. Jenel Lucks note from 4/25 reviewed.  No interval changes.  No tachypalpitations.  Plan for TEE today then atrial fibrillation ablation tomorrow.   Destiny Dennis 12/17/2011 1:14 PM

## 2011-12-17 NOTE — Discharge Instructions (Signed)
Transesophageal Echocardiography A transesophageal echocardiogram (TEE) is a special type of test that produces images of the heart by sound waves (echocardiogram). This type of echocardiogram can obtain better images of the heart than a standard echocardiogram. A TEE is done by passing a flexible tube down the esophagus. The heart is located in front of the esophagus. Because the heart and esophagus are close to one another, your caregiver can take very clear, detailed pictures of the heart via ultrasound waves. WHY HAVE A TEE? Your caregiver may need more information based on your medical condition. A TEE is usually performed due to the following:  Your caregiver needs more information based on standard echocardiogram findings.   If you had a stroke, this might have happened because a clot formed in your heart. A TEE can visualize different areas of the heart and check for clots.   To check valve anatomy and function. Your caregiver will especially look at the mitral valve.   To check for redness, soreness, and swelling (inflammation) on the inside lining of the heart (endocarditis).   To evaluate the dividing wall (septum) of the heart and presence of a hole that did not close after birth (patent foramen ovale, PFO).   To help diagnose a tear in the wall of the aorta (aortic dissection).   During cardiac valve surgery, a TEE probe is placed. This allows the surgeon to assess the valve repair before closing the chest.  LET YOUR CAREGIVER KNOW ABOUT:   Swallowing difficulties.   An esophageal obstruction.   Use of aspirin or antiplatelet therapy.  RISKS AND COMPLICATIONS  Though extremely rare, an esophageal tear (rupture) is a potential complication. BEFORE THE PROCEDURE   Arrive at least 1 hour before the procedure or as told by your caregiver.   Do not eat or drink for 6 hours before the procedure or as told by your caregiver.   An intravenous (IV) access tube will be started in  the arm.  PROCEDURE   A medicine to help you relax (sedative) will be given through the IV.   A medicine that numbs the area (local anesthetic) may be sprayed to the back of the throat.   Your blood pressure, heart rate, and breathing (vital signs) will be monitored during the procedure.   The TEE probe is a long, flexible tube. It is about the width of an adult female's index finger. The tip of the probe is placed into the back of the mouth and you will be asked to swallow. This helps to pass the tip of the probe into the esophagus. Once the tip of the probe is in the correct area, your caregiver can take pictures of the heart.   A TEE is usually not a painful procedure. You may feel the probe press against the back of the throat. The probe does not enter the trachea and does not affect your breathing.   Your time spent at the hospital is usually less than 2 hours.  AFTER THE PROCEDURE   You will be in bed, resting until you have fully returned to consciousness.   When you first awaken, your throat may feel slightly sore and will probably still feel numb. This will improve slowly over time.   You will not be allowed to eat or drink until it is clear that numbness has improved.   Once you have been able to drink, urinate, and sit on the edge of the bed without feeling sick to your stomach (nauseous)   or dizzy, you may be cleared to dress and go home.   Do not drive yourself home. You have had medications that can continue to make you feel drowsy and can impair your reflexes.   You should have a friend or family member with you for the next 24 hours after your examination.  Obtaining the test results It is your responsibility to obtain your test results. Ask the lab or department performing the test when and how you will get your results. SEEK IMMEDIATE MEDICAL CARE IF:   There is chest pain.   You have a hard time breathing or have shortness of breath.   You cough or throw up (vomit)  blood.  MAKE SURE YOU:   Understand these instructions.   Will watch this condition.   Will get help right away if you is not doing well or gets worse.  Document Released: 10/24/2002 Document Revised: 07/23/2011 Document Reviewed: 01/15/2009 [employer] Patient Information 2012 [employer]. 

## 2011-12-18 ENCOUNTER — Ambulatory Visit (HOSPITAL_COMMUNITY): Admit: 2011-12-18 | Payer: BC Managed Care – PPO | Admitting: Internal Medicine

## 2011-12-18 ENCOUNTER — Ambulatory Visit (HOSPITAL_COMMUNITY)
Admission: RE | Admit: 2011-12-18 | Discharge: 2011-12-19 | Disposition: A | Discharge status: Home / Self Care | Payer: BC Managed Care – PPO | Source: Ambulatory Visit | Attending: Internal Medicine | Admitting: Internal Medicine

## 2011-12-18 ENCOUNTER — Encounter (HOSPITAL_COMMUNITY)
Admission: RE | Disposition: A | Discharge status: Home / Self Care | Payer: Self-pay | Source: Ambulatory Visit | Attending: Internal Medicine

## 2011-12-18 ENCOUNTER — Encounter (HOSPITAL_COMMUNITY): Payer: Self-pay | Admitting: *Deleted

## 2011-12-18 ENCOUNTER — Encounter (HOSPITAL_COMMUNITY): Payer: Self-pay | Admitting: Anesthesiology

## 2011-12-18 ENCOUNTER — Encounter (HOSPITAL_COMMUNITY): Payer: Self-pay | Admitting: Cardiology

## 2011-12-18 ENCOUNTER — Ambulatory Visit (HOSPITAL_COMMUNITY): Payer: BC Managed Care – PPO | Admitting: Anesthesiology

## 2011-12-18 DIAGNOSIS — F411 Generalized anxiety disorder: Secondary | ICD-10-CM | POA: Insufficient documentation

## 2011-12-18 DIAGNOSIS — I4891 Unspecified atrial fibrillation: Secondary | ICD-10-CM

## 2011-12-18 DIAGNOSIS — I4892 Unspecified atrial flutter: Secondary | ICD-10-CM

## 2011-12-18 DIAGNOSIS — K219 Gastro-esophageal reflux disease without esophagitis: Secondary | ICD-10-CM | POA: Insufficient documentation

## 2011-12-18 DIAGNOSIS — E669 Obesity, unspecified: Secondary | ICD-10-CM | POA: Insufficient documentation

## 2011-12-18 DIAGNOSIS — I498 Other specified cardiac arrhythmias: Secondary | ICD-10-CM | POA: Insufficient documentation

## 2011-12-18 DIAGNOSIS — K449 Diaphragmatic hernia without obstruction or gangrene: Secondary | ICD-10-CM | POA: Insufficient documentation

## 2011-12-18 HISTORY — PX: ATRIAL FIBRILLATION ABLATION: SHX5456

## 2011-12-18 LAB — POCT ACTIVATED CLOTTING TIME
Activated Clotting Time: 254 seconds
Activated Clotting Time: 254 seconds
Activated Clotting Time: 276 seconds
Activated Clotting Time: 149 seconds

## 2011-12-18 SURGERY — ATRIAL FIBRILLATION ABLATION
Anesthesia: Monitor Anesthesia Care

## 2011-12-18 MED ORDER — HEPARIN SODIUM (PORCINE) 1000 UNIT/ML IJ SOLN
INTRAMUSCULAR | Status: DC | PRN
  Administered 2011-12-18: 4000 [IU] via INTRAVENOUS

## 2011-12-18 MED ORDER — SODIUM CHLORIDE 0.9 % IJ SOLN: 3.0000 mL | INTRAMUSCULAR | Status: DC | PRN

## 2011-12-18 MED ORDER — PROTAMINE SULFATE 10 MG/ML IV SOLN
INTRAVENOUS | Status: DC | PRN
  Administered 2011-12-18: 30 mg via INTRAVENOUS

## 2011-12-18 MED ORDER — RIVAROXABAN 10 MG PO TABS
20.0000 mg | ORAL_TABLET | Freq: Every evening | ORAL | Status: DC
  Administered 2011-12-18: 20 mg via ORAL
  Filled 2011-12-18 (×2): qty 2

## 2011-12-18 MED ORDER — ONDANSETRON HCL 4 MG/2ML IJ SOLN: 4.0000 mg | Freq: Four times a day (QID) | INTRAMUSCULAR | Status: DC | PRN

## 2011-12-18 MED ORDER — SODIUM CHLORIDE 0.45 % IV SOLN: INTRAVENOUS | Status: DC

## 2011-12-18 MED ORDER — BENZOCAINE 20 % MT SOLN
1.0000 "application " | OROMUCOSAL | Status: DC | PRN
  Filled 2011-12-18: qty 57

## 2011-12-18 MED ORDER — HYDROXYUREA 500 MG PO CAPS
ORAL_CAPSULE | ORAL | Status: AC
  Filled 2011-12-18: qty 1

## 2011-12-18 MED ORDER — MORPHINE SULFATE 4 MG/ML IJ SOLN: 0.0500 mg/kg | INTRAMUSCULAR | Status: DC | PRN

## 2011-12-18 MED ORDER — SODIUM CHLORIDE 0.9 % IJ SOLN
3.0000 mL | Freq: Two times a day (BID) | INTRAMUSCULAR | Status: DC
  Administered 2011-12-19 (×2): 3 mL via INTRAVENOUS

## 2011-12-18 MED ORDER — ONDANSETRON HCL 4 MG/2ML IJ SOLN: 4.0000 mg | Freq: Once | INTRAMUSCULAR | Status: AC | PRN

## 2011-12-18 MED ORDER — ONDANSETRON HCL 4 MG/2ML IJ SOLN
INTRAMUSCULAR | Status: DC | PRN
  Administered 2011-12-18: 4 mg via INTRAVENOUS

## 2011-12-18 MED ORDER — LACTATED RINGERS IV SOLN
INTRAVENOUS | Status: DC | PRN
  Administered 2011-12-18: 07:00:00 via INTRAVENOUS

## 2011-12-18 MED ORDER — DIPHENHYDRAMINE HCL 50 MG/ML IJ SOLN
INTRAMUSCULAR | Status: DC | PRN
  Administered 2011-12-18: 25 mg via INTRAVENOUS

## 2011-12-18 MED ORDER — HYDROCODONE-ACETAMINOPHEN 5-325 MG PO TABS
1.0000 | ORAL_TABLET | ORAL | Status: DC | PRN
  Administered 2011-12-18: 1 via ORAL
  Administered 2011-12-19: 2 via ORAL
  Filled 2011-12-18 (×2): qty 2
  Filled 2011-12-18: qty 1

## 2011-12-18 MED ORDER — HYDROMORPHONE HCL PF 1 MG/ML IJ SOLN
0.2500 mg | INTRAMUSCULAR | Status: DC | PRN
  Administered 2011-12-18: 0.5 mg via INTRAVENOUS
  Filled 2011-12-18: qty 1

## 2011-12-18 MED ORDER — MIDAZOLAM HCL 5 MG/5ML IJ SOLN
INTRAMUSCULAR | Status: DC | PRN
  Administered 2011-12-18: 2 mg via INTRAVENOUS

## 2011-12-18 MED ORDER — ACETAMINOPHEN 325 MG PO TABS: 650.0000 mg | ORAL_TABLET | ORAL | Status: DC | PRN

## 2011-12-18 MED ORDER — MIDAZOLAM HCL 10 MG/2ML IJ SOLN: 10.0000 mg | Freq: Once | INTRAMUSCULAR | Status: DC

## 2011-12-18 MED ORDER — PROPOFOL 10 MG/ML IV EMUL
INTRAVENOUS | Status: DC | PRN
  Administered 2011-12-18: 20 mg via INTRAVENOUS
  Administered 2011-12-18: 30 mg via INTRAVENOUS

## 2011-12-18 MED ORDER — PROPOFOL 10 MG/ML IV EMUL
INTRAVENOUS | Status: DC | PRN
  Administered 2011-12-18: 75 ug/kg/min via INTRAVENOUS

## 2011-12-18 MED ORDER — FENTANYL CITRATE 0.05 MG/ML IJ SOLN: 250.0000 ug | Freq: Once | INTRAMUSCULAR | Status: DC

## 2011-12-18 MED ORDER — FENTANYL CITRATE 0.05 MG/ML IJ SOLN
INTRAMUSCULAR | Status: DC | PRN
  Administered 2011-12-18 (×5): 50 ug via INTRAVENOUS
  Administered 2011-12-18: 100 ug via INTRAVENOUS

## 2011-12-18 MED ORDER — SODIUM CHLORIDE 0.9 % IV SOLN: 250.0000 mL | INTRAVENOUS | Status: DC | PRN

## 2011-12-18 MED ORDER — METOPROLOL SUCCINATE ER 100 MG PO TB24
100.0000 mg | ORAL_TABLET | Freq: Every evening | ORAL | Status: DC
  Administered 2011-12-18: 100 mg via ORAL
  Filled 2011-12-18 (×2): qty 1

## 2011-12-18 MED ORDER — BUPIVACAINE HCL (PF) 0.25 % IJ SOLN
INTRAMUSCULAR | Status: AC
  Filled 2011-12-18: qty 30

## 2011-12-18 NOTE — Preoperative (Signed)
Beta Blockers   Reason not to administer Beta Blockers:Not Applicable. Metoprolol 100mg  @ 2100 12/18/11

## 2011-12-18 NOTE — H&P (Signed)
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 female with a h/o paroxysmal atrial fibrillation and atrial flutter who presents today for EP consultation. She reports initially being diagnosed with atrial fibrillation 8 or 9 years ago after a miscarriage. She did well until this past year. She reports that over the years she would have "short episodes" lasting several hours at a time. Over the past 6 months, she has had increasing frequency and duration in episodes of afib. She finds that exercise will occasionally convert her back into sinus rhythm. She is unaware of any triggers or precipitants for her afib. She finds that if she goes to bed, episodes have always resolved by am. She reports symptoms of palpitations and fatigue during her afib. She reports occasional lightheadedness. Presently, she finds that episodes occur 5-6 times per month, lasting for several hours at a time. She was evaluated by Dr Jens Som who placed an event monitor. This revealed atrial fibrillation as well as atrial flutter. She was placed on flecainide. She did not tolerate flecainide due to nausea and dizziness. She also feels that her episodes increased.  Today, she denies symptoms of chest pain, shortness of breath, orthopnea, PND, lower extremity edema, presyncope, syncope, or neurologic sequela. The patient is tolerating medications without difficulties and is otherwise without complaint today.   Past Medical History   Diagnosis  Date   .  Atrial fibrillation  03/05/2007   .  EUSTACHIAN TUBE DYSFUNCTION, LEFT  03/18/2010   .  GERD  12/23/2009   .  Sialoadenitis  03/18/2010   .  Hyperlipidemia    .  PVC (premature ventricular contraction)    .  History of MRSA infection  2008   .  Allergic rhinitis    .  Cervical disc disease    .  Benign fundic gland polyps of stomach    .  Hiatal hernia    .  Diverticulitis      CT Scan   .  Hypertension      pt  doesnt think she has high blood pressure   .  Hemorrhoids     Past Surgical History   Procedure  Date   .  Knee surgery      x 9 Left Knee   .  Cholecystectomy, laparoscopic  2008   .  Upper gastrointestinal endoscopy  01/30/2010     hiatal hernia, fundic gland polyps   .  Shoulder surgery      Right   .  Elbow surgery      Right   .  Foot surgery      Right   .  Cervical fusion    .  Cesarean section      x 2   .  Wrist surgery      Left   .  Cholecystectomy    .  Flexible sigmoidoscopy      1990's   .  Colonoscopy  07/16/2011     diverticulosis    Current Outpatient Prescriptions   Medication  Sig  Dispense  Refill   .  aspirin 81 MG tablet  Take 81 mg by mouth daily.     .  calcium-vitamin D (OSCAL WITH D) 250-125 MG-UNIT per tablet  Take 1 tablet by mouth daily.     .  Cholecalciferol (VITAMIN D3) 5000 UNITS TABS  Take 5,000 Units by mouth daily.     Marland Kitchen  co-enzyme Q-10 50 MG capsule  Take 50 mg by mouth daily.     .  diazepam (VALIUM) 5 MG tablet  Take 1 tablet by mouth At bedtime.     .  famotidine-calcium carbonate-magnesium hydroxide (PEPCID COMPLETE) 10-800-165 MG CHEW  Chew 1 tablet by mouth daily as needed.     .  fish oil-omega-3 fatty acids 1000 MG capsule  Take 2 g by mouth daily.     .  Ibuprofen (MOTRIN PO)  Take by mouth as needed.     .  Magnesium Citrate 100 MG TABS  Take 100 mg by mouth daily.     .  metoprolol (TOPROL-XL) 100 MG 24 hr tablet  Take 1 tablet (100 mg total) by mouth daily.  90 tablet  3   .  Multiple Vitamin (MULTIVITAMIN) tablet  Take 1 tablet by mouth daily.     Marland Kitchen  PROMETRIUM 100 MG capsule  Take 100 mg by mouth daily.     .  vitamin E 100 UNIT capsule  Take 100 Units by mouth daily.     Marland Kitchen  VIVELLE-DOT 0.1 MG/24HR  Place 1 patch onto the skin 2 (two) times a week.      No Known Allergies  History    Social History   .  Marital Status:  Married     Spouse Name:  N/A     Number of Children:  2   .  Years of Education:  N/A     Occupational History   .  [employer]      Workers Comp    Social History Main Topics   .  Smoking status:  Never Smoker   .  Smokeless tobacco:  Never Used   .  Alcohol Use:  No   .  Drug Use:  No   .  Sexually Active:  Not on file      has periods q 3-4 months- no possiblity pregnant per pt.07-16-11    Other Topics  Concern   .  Not on file    Social History Narrative    Daily Caffeine Pt lives in [place] with spouse. She works as a workers Runner, broadcasting/film/video.    Family History   Problem  Relation  Age of Onset   .  Diabetes  Maternal Grandfather    .  Colon cancer  Neg Hx    .  Diverticulitis  Brother       x 2    .  Diverticulitis  Paternal Grandmother     ROS- All systems are reviewed and negative except as per the HPI above  Physical Exam:  Filed Vitals:    11/09/11 1146   BP:  132/82   Pulse:  59   Height:  5\' 11"  (1.803 m)   Weight:  252 lb 1.9 oz (114.361 kg)    GEN- The patient is well appearing, alert and oriented x 3 today.  Head- normocephalic, atraumatic  Eyes- Sclera clear, conjunctiva pink  Ears- hearing intact  Oropharynx- clear  Neck- supple, no JVP  Lymph- no cervical lymphadenopathy  Lungs- Clear to ausculation bilaterally, normal work of breathing  Heart- Regular rate and rhythm, no murmurs, rubs or gallops, PMI not laterally displaced  GI- soft, NT, ND, + BS  Extremities- no clubbing, cyanosis, or edema  MS- no significant deformity or atrophy  Skin- no rash or lesion  Psych- euthymic mood, full affect  Neuro- strength and sensation are intact  EKG today reveals sinus rhythm 59 bpm PR 158, otherwise normal ekg  Assessment and Plan:   Atrial fibrillation - Hillis Range, MD   The patient has symptomatic paroxysmal atrial fibrillation and atrial flutter. She has failed medical therapy with flecainide and metoprolol. Therapeutic strategies for afib and atrial flutter including medicine and ablation were discussed in detail with the  patient today. Risk, benefits, and alternatives to EP study and radiofrequency ablation were also discussed in detail today. These risks include but are not limited to stroke, bleeding, vascular damage, tamponade, perforation, damage to the esophagus, lungs, and other structures, pulmonary vein stenosis, worsening renal function, and death. The patient understands these risk and wishes to proceed. I will initiate xarelto 20mg  daily today. We will therefore proceed with catheter ablation once the patient has been adequately anticoagulated.

## 2011-12-18 NOTE — Anesthesia Preprocedure Evaluation (Addendum)
Anesthesia Evaluation  Patient identified by MRN, date of birth, ID band Patient awake    Reviewed: Allergy & Precautions, H&P , NPO status , Patient's Chart, lab work & pertinent test results, reviewed documented beta blocker date and time   Airway Mallampati: I TM Distance: >3 FB Neck ROM: Full    Dental  (+) Teeth Intact and Dental Advisory Given   Pulmonary          Cardiovascular + dysrhythmias Atrial Fibrillation and Supra Ventricular Tachycardia     Neuro/Psych Anxiety  Neuromuscular disease    GI/Hepatic hiatal hernia, GERD-  Controlled,  Endo/Other    Renal/GU      Musculoskeletal   Abdominal (+) + obese,   Peds  Hematology   Anesthesia Other Findings   Reproductive/Obstetrics                          Anesthesia Physical Anesthesia Plan  ASA: III  Anesthesia Plan: MAC   Post-op Pain Management:    Induction: Intravenous  Airway Management Planned: Simple Face Mask  Additional Equipment:   Intra-op Plan:   Post-operative Plan:   Informed Consent: I have reviewed the patients History and Physical, chart, labs and discussed the procedure including the risks, benefits and alternatives for the proposed anesthesia with the patient or authorized representative who has indicated his/her understanding and acceptance.   Dental advisory given  Plan Discussed with: CRNA and Surgeon  Anesthesia Plan Comments:        Anesthesia Quick Evaluation

## 2011-12-18 NOTE — Op Note (Signed)
SURGEON:  Hillis Range, MD  PREPROCEDURE DIAGNOSES: 1. Paroxysmal atrial fibrillation. 2. Typical appearing atrial flutter  POSTPROCEDURE DIAGNOSES: 1. Paroxysmal  atrial fibrillation. 2. Isthmus dependant right atrial flutter  PROCEDURES: 1. Comprehensive electrophysiologic study. 2. Coronary sinus pacing and recording. 3. Three-dimensional mapping of supraventricular tachycardia (afib with add on right atrial flutter). 4. Ablation of supraventricular tachycardia. (afib with add on right atrial flutter). 5. Intracardiac echocardiography. 6. Transseptal puncture of an intact septum. 7. Rotational Angiography with processing at an independent workstation 8. Arrhythmia induction with pacing with isuprel infusion  INTRODUCTION:  Destiny Dennis is a 51 y.o. female with a history of paroxysmal atrial fibrillation and typical appearing atrial flutter who now presents for EP study and radiofrequency ablation.  The patient reports initially being diagnosed with atrial fibrillation after presenting with symptomatic palpitations and fatgiue. The patient reports increasing frequency and duration of atrial arrhythmias since that time.  The patient has failed medical therapy with flecainide.  The patient therefore presents today for catheter ablation of atrial fibrillation and atrial flutter.  DESCRIPTION OF PROCEDURE:  Informed written consent was obtained, and the patient was brought to the electrophysiology lab in a fasting state.  The patient was adequately sedated with intravenous medications as outlined in the anesthesia report.  The patient's left and right groins were prepped and draped in the usual sterile fashion by the EP lab staff.  Using a percutaneous Seldinger technique, two 7-French and one 8-French hemostasis sheaths were placed into the right common femoral vein.   An 11-French hemostasis sheath was placed into the left common femoral vein.   3 Dimensional Rotational Angiography: A 5  french pigtail catheter was introduced through the right common femoral vein and advanced into the inferior venocava.  3 demential rotational angiography was then performed by power injection of 100cc of nonionic contrast.  Reprocessing at an independent work station was then performed.  Unfortunately, images were inadequate for interpretation today.   Catheter Placement:  A 7-French Biosense Webster Decapolar coronary sinus catheter was introduced through the right common femoral vein and advanced into the coronary sinus for recording and pacing from this location.  A 6-French quadripolar Josephson catheter was introduced through the right common femoral vein and advanced into the right ventricle for recording and pacing.  This catheter was then pulled back to the His bundle location.    Initial Measurements: The patient presented to the electrophysiology lab in sinus rhythm.  His PR interval measured 180 with a QRS duringation of 88 and a QT interval of 397 msec.  The AH interval measured 110 and the HV interval measured 44 msec.   Ventricular pacing was performed, which revealed VA dissociation when pacing at 600 msec.   Intracardiac Echocardiography: A 10-French Biosense Webster AcuNav intracardiac echocardiography catheter was introduced through the left common femoral vein and advanced into the right atrium. Intracardiac echocardiography was performed of the left atrium, and a three-dimensional anatomical rendering of the left atrium was performed using CARTO sound technology.  The patient was noted to have a moderate sized left atrium.  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 All 4 pulmonary veins were visualized and noted to have separate ostia.  The pulmonary veins were moderate in size.  The left atrial appendage was visualized and did not reveal thrombus.   There was no evidence of pulmonary vein stenosis.   Transseptal Puncture: The middle right common femoral vein sheath was exchanged for an  8.5 [place] SL2 transseptal sheath and transseptal access  was achieved in a standard fashion using a Brockenbrough needle under biplane fluoroscopy with intracardiac echocardiography confirmation of the transseptal puncture.  Once transseptal access had been achieved, heparin was administered intravenously and intra- arterially in order to maintain an ACT of greater than 300 seconds throughout the procedure.    3D Mapping and Ablation: The His bundle catheter was removed and in its place a 3.5 mm Biosense Webster E[employer] ablation catheter was advanced into the right atrium.  The transseptal sheath was pulled back into the IVC over a guidewire.  The ablation catheter was advanced across the transseptal hole using the wire as a guide.  The transseptal sheath was then re-advanced over the guidewire into the left atrium.  A duodecapolar Biosense Webster circular mapping catheter was introduced through the transseptal sheath and positioned over the mouth of all 4 pulmonary veins.  Three-dimensional electroanatomical mapping was performed using CARTO technology.  This demonstrated electrical activity within all four pulmonary veins at baseline. The patient underwent successful sequential electrical isolation and anatomical encircling of all four pulmonary veins using radiofrequency current with a circular mapping catheter as a guide.    Measurements Following Ablation: Following ablation, Isuprel was infused up to 20 mcg/min with no inducible atrial fibrillation, atrial tachycardia, atrial flutter, or sustained PACs initially.   With catheter manipulation during isuprel infusion, the patient developed atrial flutter with a cycle length of 200 msec.  The CS activation was proximal to distal and the surface ekg revealed typical appearing atrial flutter.  Entrainment from the CTI revealed a PPI nearly equaly to the tachycardia cycle length.  I therefore elected to perform CTI ablation.  Additional Right  atrial Ablation:  The ablation catheter was then pulled back into the right atrial and positioned along the cavo-tricuspid isthmus.  Mapping along the atrial side of the isthmus was performed.  This demonstrated a standard isthmus.  A series of radiofrequency applications were then delivered along the isthmus.  Complete bidirectional cavotricuspid isthmus block was achieved as confirmed by differential atrial pacing from the low lateral right atrium.  A stimulus to earliest atrial activation across the isthmus measured 150 msec bi-directionally.  The patient was observe without return of conduction through the isthmus.  Rapid atrial pacing was performed, which revealed an AV Wenckebach cycle length of 320 msec. There was no inducible arrhythmia.  Intracardiac echocardiography was again performed, which revealed no pericardial effusion.  The procedure was therefore considered completed.  All catheters were removed, and the sheaths were aspirated and flushed.  The patient was transferred to the recovery area for sheath removal per protocol.  A limited bedside transthoracic echocardiogram revealed no pericardial effusion.  There were no early apparent complications.  CONCLUSIONS: 1. Sinus rhythm upon presentation.   2. Successful electrical isolation and anatomical encircling of all four pulmonary veins with radiofrequency current. 3. Cavo-tricuspid isthmus ablation was performed with complete bidirectional isthmus block achieved.  4. No inducible arrhythmias following ablation both on and off of Isuprel 5. No early apparent complications.   Annaston Upham,MD 11:35 AM 12/18/2011

## 2011-12-18 NOTE — Transfer of Care (Signed)
Immediate Anesthesia Transfer of Care Note  Patient: Destiny Dennis  Procedure(s) Performed: Procedure(s) (LRB): ATRIAL FIBRILLATION ABLATION (N/A)  Patient Location: PACU and Cath Lab  Anesthesia Type: MAC  Level of Consciousness: awake, alert  and oriented  Airway & Oxygen Therapy: Patient Spontanous Breathing  Post-op Assessment: Report given to PACU RN and Post -op Vital signs reviewed and stable  Post vital signs: Reviewed  Complications: No apparent anesthesia complications

## 2011-12-18 NOTE — Progress Notes (Signed)
Doing well s/p afib ablation Resume xarelto and metoprolol at discharge. Add protonix 40mg  daily x 6 weeks  DC to home in am Follow-up with me in 12 weeks.  Fayrene Fearing Deyci Gesell,MD

## 2011-12-18 NOTE — Anesthesia Postprocedure Evaluation (Signed)
Anesthesia Post Note  Patient: Destiny Dennis  Procedure(s) Performed: Procedure(s) (LRB): ATRIAL FIBRILLATION ABLATION (N/A)  Anesthesia type: general  Patient location: PACU  Post pain: Pain level controlled  Post assessment: Patient's Cardiovascular Status Stable  Last Vitals:  Filed Vitals:   12/18/11 0614  BP: 132/74  Pulse: 60  Temp: 36.2 C  Resp: 20    Post vital signs: Reviewed and stable  Level of consciousness: sedated  Complications: No apparent anesthesia complications

## 2011-12-19 ENCOUNTER — Encounter (HOSPITAL_COMMUNITY): Payer: Self-pay | Admitting: Nurse Practitioner

## 2011-12-19 DIAGNOSIS — I4891 Unspecified atrial fibrillation: Secondary | ICD-10-CM

## 2011-12-19 DIAGNOSIS — I4892 Unspecified atrial flutter: Secondary | ICD-10-CM | POA: Insufficient documentation

## 2011-12-19 LAB — BASIC METABOLIC PANEL
Calcium: 8.8 mg/dL (ref 8.4–10.5)
GFR calc Af Amer: >90 mL/min (ref >90)
GFR calc non Af Amer: >90 mL/min (ref >90)
Glucose, Bld: 97 mg/dL (ref 70–99)
Potassium: 4.3 mEq/L (ref 3.5–5.1)
Sodium: 136 mEq/L (ref 135–145)

## 2011-12-19 MED ORDER — PANTOPRAZOLE SODIUM 40 MG PO TBEC: 40.0000 mg | DELAYED_RELEASE_TABLET | Freq: Every day | ORAL | Status: DC

## 2011-12-19 NOTE — Discharge Summary (Signed)
Patient ID: Destiny Dennis,  MRN: [id number], DOB/AGE: 49-Feb-1962 49 y.o.  Admit date: 12/18/2011 Discharge date: 12/19/2011  Primary Care Provider: Judie Petit, MD Primary Cardiologist: B. Crenshaw, MD/J. Allred, MD  Discharge Diagnoses Principal Problem:  *Atrial fibrillation Active Problems:  Obesity  Atrial flutter   Allergies Allergies  Allergen Reactions  . Avelox (Moxifloxacin Hcl In Nacl) Other (See Comments)    Numbness & tingling    Procedures  PROCEDURES: 1. Comprehensive electrophysiologic study. 2. Coronary sinus pacing and recording. 3. Three-dimensional mapping of supraventricular tachycardia (afib with add on right atrial flutter). 4. Ablation of supraventricular tachycardia. (afib with add on right atrial flutter). 5. Intracardiac echocardiography. 6. Transseptal puncture of an intact septum. 7. Rotational Angiography with processing at an independent workstation 8. Arrhythmia induction with pacing with isuprel infusion  CONCLUSIONS: 1. Sinus rhythm upon presentation.    2. Successful electrical isolation and anatomical encircling of all four pulmonary veins with radiofrequency current. 3. Cavo-tricuspid isthmus ablation was performed with complete bidirectional isthmus block achieved.   4. No inducible arrhythmias following ablation both on and off of Isuprel 5. No early apparent complications. _____________  History of Present Illness  51 y/o female with prior h/o paroxysmal atrial fibrillation and flutter.  She recently noted increased frequency of symptoms and was placed on an event monitor, which confirmed paf/paflutter.  She was then placed on Flecainide therapy, which she did not tolerated secondary to nausea and dizziness.  She subsequently saw Dr. Johney Frame for consideration of afib/aflutter ablation on 3/25.  After discussion, decision was made to initiate xarelto therapy and plan for TEE followed by radiofrequency catheter ablation.  Pt  underwent TEE on 5/2, showing no evidence of LA or LAA thrombus and normal LV/RV systolic function.  Hospital Course  Pt presented to the [facility] EP lab on 5/3 and underwent EP study and subsequent RFCA of atrial fibrillation and atrial flutter as outlined above.  She tolerated this procedure well and post-procedure has not had any more atrial arrhythmias.  She will be discharged home today in good condition on beta blocker, xarelto, and PPI therapy.  Discharge Vitals Blood pressure 108/53, pulse 65, temperature 98.4 F (36.9 C), temperature source Oral, resp. rate 16, height 5\' 11"  (1.803 m), weight 245 lb (111.131 kg), last menstrual period 04/18/2011, SpO2 95.00%.  Filed Weights   12/18/11 0614  Weight: 245 lb (111.131 kg)    Labs  Basic Metabolic Panel  Basename 12/19/11 0500  NA 136  K 4.3  CL 103  CO2 26  GLUCOSE 97  BUN 12  CREATININE 0.78  CALCIUM 8.8  MG --  PHOS --   Disposition  Pt is being discharged home today in good condition.  Follow-up Plans & Appointments  Follow-up Information    Follow up with Hillis Range, MD in 12 weeks. (we will arrange)    Contact information:   [address] [place] [address] [phone]          Discharge Medications  Medication List  As of 12/19/2011  9:26 AM   STOP taking these medications         diazepam 5 MG/ML solution      vitamin E 100 UNIT capsule         TAKE these medications         calcium-vitamin D 250-125 MG-UNIT per tablet   Commonly known as: OSCAL WITH D   Take 1 tablet by mouth daily.  co-enzyme Q-10 50 MG capsule   Take 50 mg by mouth daily.      diazepam 5 MG tablet   Commonly known as: VALIUM   Take 1 tablet by mouth At bedtime.      estradiol 0.1 MG/24HR   Commonly known as: VIVELLE-DOT   Place 1 patch (0.1 mg total) onto the skin 2 (two) times a week.      famotidine-calcium carbonate-magnesium hydroxide 10-800-165 MG Chew   Commonly known as:  PEPCID COMPLETE   Chew 1 tablet by mouth daily as needed. Acid reflux      fish oil-omega-3 fatty acids 1000 MG capsule   Take 2 g by mouth daily.      Magnesium Citrate 100 MG Tabs   Take 100 mg by mouth daily.      metoprolol succinate 100 MG 24 hr tablet   Commonly known as: TOPROL-XL   Take 100 mg by mouth every evening.      multivitamin tablet   Take 1 tablet by mouth daily.      pantoprazole 40 MG tablet   Commonly known as: PROTONIX   Take 1 tablet (40 mg total) by mouth daily.      progesterone 100 MG capsule   Commonly known as: PROMETRIUM   Take 1 capsule (100 mg total) by mouth daily.      Rivaroxaban 20 MG Tabs   Take 20 mg by mouth daily.      SYSTANE FREE OP   Place 1 drop into both eyes every other day.            Outstanding Labs/Studies  None  Duration of Discharge Encounter   Greater than 30 minutes including physician time.  Signed, Nicolasa Ducking NP 12/19/2011, 9:26 AM   Destiny Sans. Daleen Squibb, MD, [facility] Pager:  [phone]

## 2011-12-19 NOTE — Progress Notes (Signed)
Report received from RN--Pt was d/c'd at 0930 but waiting on ride. This RN gave d/c instructions and took pt to car in w/c.

## 2011-12-19 NOTE — Discharge Instructions (Signed)
***  PLEASE REMEMBER TO BRING ALL OF YOUR MEDICATIONS TO EACH OF YOUR FOLLOW-UP OFFICE VISITS.  NO HEAVY LIFTING OR SEXUAL ACTIVITY X 7 DAYS. NO DRIVING X 2-3 DAYS. NO SOAKING BATHS, HOT TUBS, POOLS, ETC., X 7 DAYS.  

## 2011-12-19 NOTE — Progress Notes (Signed)
Patient ID: RILEA ARUTYUNYAN, female   DOB: [birth year], 51 y.o.   MRN: [id number]   Patient Name: Destiny Dennis Date of Encounter: 12/19/2011    SUBJECTIVE  Doing well. Groin stable without discomfort. Ambulating without difficulty.  CURRENT MEDS    . hydroxyurea      . metoprolol succinate  100 mg Oral QPM  . rivaroxaban  20 mg Oral QPM  . sodium chloride  3 mL Intravenous Q12H  . DISCONTD: fentaNYL  250 mcg Intravenous Once  . DISCONTD: midazolam  10 mg Intravenous Once    OBJECTIVE  Filed Vitals:   12/18/11 2010 12/18/11 2308 12/18/11 2310 12/19/11 0700  BP: 114/52  103/56 108/53  Pulse: 65     Temp: 98.1 F (36.7 C) 98.5 F (36.9 C)  98.4 F (36.9 C)  TempSrc: Oral Oral  Oral  Resp: 19  16   Height:      Weight:      SpO2: 98%  95%     Intake/Output Summary (Last 24 hours) at 12/19/11 0848 Last data filed at 12/19/11 0700  Gross per 24 hour  Intake   1523 ml  Output    950 ml  Net    573 ml   Filed Weights   12/18/11 0614  Weight: 245 lb (111.131 kg)    PHYSICAL EXAM  General: Pleasant, NAD. Neuro: Alert and oriented X 3. Moves all extremities spontaneously. Psych: Normal affect. HEENT:  Normal  Neck: Supple without bruits or JVD. Lungs:  Resp regular and unlabored, CTA. Heart: RRR no s3, s4, or murmurs. Abdomen: Soft, non-tender, non-distended, BS + x 4.  Extremities: No clubbing, cyanosis or edema. DP/PT/Radials 2+ and equal bilaterally.  Accessory Clinical Findings  CBC No results found for this basename: WBC:2,NEUTROABS:2,HGB:2,HCT:2,MCV:2,PLT:2 in the last 72 hours Basic Metabolic Panel  Basename 12/19/11 0500  NA 136  K 4.3  CL 103  CO2 26  GLUCOSE 97  BUN 12  CREATININE 0.78  CALCIUM 8.8  MG --  PHOS --   Liver Function Tests No results found for this basename: AST:2,ALT:2,ALKPHOS:2,BILITOT:2,PROT:2,ALBUMIN:2 in the last 72 hours No results found for this basename: LIPASE:2,AMYLASE:2 in the last 72 hours Cardiac  Enzymes No results found for this basename: CKTOTAL:3,CKMB:3,CKMBINDEX:3,TROPONINI:3 in the last 72 hours BNP No components found with this basename: POCBNP:3 D-Dimer No results found for this basename: DDIMER:2 in the last 72 hours Hemoglobin A1C No results found for this basename: HGBA1C in the last 72 hours Fasting Lipid Panel No results found for this basename: CHOL,HDL,LDLCALC,TRIG,CHOLHDL,LDLDIRECT in the last 72 hours Thyroid Function Tests No results found for this basename: TSH,T4TOTAL,FREET3,T3FREE,THYROIDAB in the last 72 hours  TELE  NSR  ECG  NSR, normal EKG  Radiology/Studies  No results found.  ASSESSMENT AND PLAN  Active Problems:  Atrial fibrillation  Obesity    S/P successful ablation. Discharge today. Resume Xarelto and Metoprolol. Protonix x 6 weeks. Followup with Dr Johney Frame.  Signed, Valera Castle MD

## 2011-12-24 ENCOUNTER — Emergency Department (HOSPITAL_COMMUNITY): Payer: BC Managed Care – PPO

## 2011-12-24 ENCOUNTER — Encounter (HOSPITAL_COMMUNITY): Payer: Self-pay | Admitting: Emergency Medicine

## 2011-12-24 ENCOUNTER — Emergency Department (HOSPITAL_COMMUNITY)
Admission: EM | Admit: 2011-12-24 | Discharge: 2011-12-24 | Disposition: A | Discharge status: Home / Self Care | Payer: BC Managed Care – PPO | Attending: Emergency Medicine | Admitting: Emergency Medicine

## 2011-12-24 DIAGNOSIS — Z79899 Other long term (current) drug therapy: Secondary | ICD-10-CM | POA: Insufficient documentation

## 2011-12-24 DIAGNOSIS — I480 Paroxysmal atrial fibrillation: Secondary | ICD-10-CM

## 2011-12-24 DIAGNOSIS — R42 Dizziness and giddiness: Secondary | ICD-10-CM | POA: Insufficient documentation

## 2011-12-24 DIAGNOSIS — I4891 Unspecified atrial fibrillation: Secondary | ICD-10-CM | POA: Insufficient documentation

## 2011-12-24 DIAGNOSIS — I4892 Unspecified atrial flutter: Secondary | ICD-10-CM

## 2011-12-24 DIAGNOSIS — R002 Palpitations: Secondary | ICD-10-CM

## 2011-12-24 LAB — CBC
HCT: 35.6 % — ABNORMAL LOW (ref 36.0–46.0)
Hemoglobin: 12.1 g/dL (ref 12.0–15.0)
MCHC: 34 g/dL (ref 30.0–36.0)
RBC: 4.17 MIL/uL (ref 3.87–5.11)
WBC: 9.7 10*3/uL (ref 4.0–10.5)

## 2011-12-24 LAB — DIFFERENTIAL
Basophils Relative: 0 % (ref 0–1)
Lymphocytes Relative: 34 % (ref 12–46)
Monocytes Absolute: 0.8 10*3/uL (ref 0.1–1.0)
Monocytes Relative: 8 % (ref 3–12)
Neutro Abs: 5.3 10*3/uL (ref 1.7–7.7)
Neutrophils Relative %: 55 % (ref 43–77)

## 2011-12-24 LAB — BASIC METABOLIC PANEL
Chloride: 103 mEq/L (ref 96–112)
GFR calc Af Amer: 75 mL/min — ABNORMAL LOW (ref >90)
GFR calc non Af Amer: 65 mL/min — ABNORMAL LOW (ref >90)
Potassium: 4.2 mEq/L (ref 3.5–5.1)
Sodium: 139 mEq/L (ref 135–145)

## 2011-12-24 LAB — MAGNESIUM: Magnesium: 2.1 mg/dL (ref 1.5–2.5)

## 2011-12-24 LAB — POCT I-STAT TROPONIN I

## 2011-12-24 MED ORDER — DILTIAZEM HCL 100 MG IV SOLR
5.0000 mg/h | Freq: Once | INTRAVENOUS | Status: AC
  Administered 2011-12-24 (×2): 5 mg/h via INTRAVENOUS

## 2011-12-24 MED ORDER — DILTIAZEM HCL 60 MG PO TABS: 60.0000 mg | ORAL_TABLET | Freq: Every day | ORAL | Status: DC | PRN

## 2011-12-24 NOTE — ED Notes (Signed)
Dr Butler at bedside.  

## 2011-12-24 NOTE — ED Notes (Signed)
Pt stated itching to L arm immediately above IV site after diltiazem given.  No hives noted.  IV flushed.  No infiltration noted.  Pharmacy called and they stated no action at this time

## 2011-12-24 NOTE — Consult Note (Signed)
Cardiology IP Consult  Reason for Consult: palpitations, dizzyness Referring Physician: Dr Karma Ganja  HPI: Destiny Dennis is a 51 y.o.female with paroxysmal afib/flutter s/p radiofrequency ablation of afib/flutter 5 days ago who presents to the ED with dizziness and palpitations.  For the last few nights she has noted her HR has been really slow and she feels an uncomfortable palpitation in her chest.  Her heart seems to skip a beat.  She has not had chest pain or SOB. She has no orthopnea or PND.  She has no syncope or near syncope.  She has no LE edema.  She has been on xarelto daily since her ablation.  While in the ED she developed rapid atrial fibrillation with RVR and was started on diltiazem.  She is concerned that she felt so bad even when she was in normal sinus rhythm.  By her EKG, it appears she was having frequent PAC's with aberrant conduction.  She seems to be having more symptoms from her PAC's than from afib.  Prior to her ablation she was having increasing frequency of afib associated with palpitations and fatigue.  She did not tolerate flecainide due to nausea and dizziness.  She is very concerned as her palpitations she experienced today were in sinus rhythm (PAC's/PVC's) and felt worse than they did when in afib.   Past Medical History  Diagnosis Date  . Atrial fibrillation 03/05/2007    a. s/p RFCA 12/18/2011  . 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 GERD 12/23/2009  . Sialoadenitis 03/18/2010  . Hyperlipidemia   . PVC (premature ventricular contraction)   . History of MRSA infection 2008  . Allergic rhinitis   . Cervical disc disease   . Benign fundic gland polyps of stomach   . Hiatal hernia   . Diverticulitis     CT Scan   . Hemorrhoids   . Atrial flutter     a. s/p RFCA 12/18/2011    Past Surgical History  Procedure Date  . Knee surgery     x 9 Left Knee  . Cholecystectomy, laparoscopic 2008  . Upper gastrointestinal endoscopy 01/30/2010    hiatal hernia, fundic  gland polyps  . Shoulder surgery     Right   . Elbow surgery     Right  . Foot surgery     Right   . Cervical fusion   . Cesarean section     x 2  . Wrist surgery     Left   . Cholecystectomy   . Flexible sigmoidoscopy     1990's  . Colonoscopy 07/16/2011    diverticulosis  . Tee without cardioversion 12/17/2011    Procedure: TRANSESOPHAGEAL ECHOCARDIOGRAM (TEE);  Surgeon: Laurey Morale, MD;  Location: [facility] ENDOSCOPY;  Service: Cardiovascular;  Laterality: N/A;    Family History  Problem Relation Age of Onset  . Diabetes Maternal Grandfather   . Colon cancer Neg Hx   . Diverticulitis Brother     x 2  . Diverticulitis Paternal Grandmother     Social History:  reports that she has never smoked. She has never used smokeless tobacco. She reports that she does not drink alcohol or use illicit drugs.  Allergies:  Allergies  Allergen Reactions  . Avelox (Moxifloxacin Hcl In Nacl) Other (See Comments)    Numbness & tingling Peripheral neuropathy    Current Facility-Administered Medications  Medication Dose Route Frequency Provider Last Rate Last Dose  . diltiazem (CARDIZEM) 100 mg in dextrose 5 % 100 mL infusion  5-15 mg/hr Intravenous Once Ethelda Chick, MD 5 mL/hr at 12/24/11 0406 5 mg/hr at 12/24/11 0406   Current Outpatient Prescriptions  Medication Sig Dispense Refill  . calcium-vitamin D (OSCAL WITH D) 250-125 MG-UNIT per tablet Take 1 tablet by mouth daily.      Marland Kitchen co-enzyme Q-10 50 MG capsule Take 50 mg by mouth daily.      . diazepam (VALIUM) 5 MG tablet Take 1 tablet by mouth at bedtime as needed. For sleep      . estradiol (VIVELLE-DOT) 0.1 MG/24HR Place 1 patch (0.1 mg total) onto the skin 2 (two) times a week.  24 patch  12  . famotidine-calcium carbonate-magnesium hydroxide (PEPCID COMPLETE) 10-800-165 MG CHEW Chew 1 tablet by mouth daily as needed. Acid reflux      . fish oil-omega-3 fatty acids 1000 MG capsule Take 2 g by mouth daily.      . Magnesium  Citrate 100 MG TABS Take 100 mg by mouth daily.      . metoprolol succinate (TOPROL-XL) 100 MG 24 hr tablet Take 100 mg by mouth every evening.      . Multiple Vitamin (MULITIVITAMIN WITH MINERALS) TABS Take 1 tablet by mouth daily.      . pantoprazole (PROTONIX) 40 MG tablet Take 1 tablet (40 mg total) by mouth daily.  30 tablet  2  . Polyethyl Glycol-Propyl Glycol (SYSTANE FREE OP) Place 1 drop into both eyes every other day.      . progesterone (PROMETRIUM) 100 MG capsule Take 1 capsule (100 mg total) by mouth daily.  100 capsule  5  . Rivaroxaban (XARELTO) 20 MG TABS Take 20 mg by mouth daily.  30 tablet  11  . DISCONTD: flecainide (TAMBOCOR) 100 MG tablet Take 1 tablet (100 mg total) by mouth 2 (two) times daily.  60 tablet  12    ROS: A full review of systems is obtained and is negative except as noted in the HPI.  Physical Exam: Blood pressure 122/78, pulse 96, temperature 97.6 F (36.4 C), temperature source Oral, resp. rate 16, last menstrual period 04/18/2011, SpO2 99.00%.  GENERAL: no acute distress.  EYES: Extra ocular movements are intact. There is no lid lag. Sclera is anicteric.  ENT: Oropharynx is clear. Dentition is within normal limits.  NECK: Supple. The thyroid is not enlarged.  HEART: irregular without m/r/g, no JVD LUNGS: Clear to auscultation There are no rales, rhonchi, or wheezes.  ABDOMEN: Soft, non-tender, and non-distended with normoactive bowel sounds. There is no hepatosplenomegaly.  EXTREMITIES: No clubbing, cyanosis, or edema.  PULSES:  DP/PT pulses were +2 and equal bilaterally.  SKIN: Warm, dry, and intact.  NEUROLOGIC: The patient was oriented to person, place, and time. No overt neurologic deficits were detected.  PSYCH: Normal judgment and insight, mood is appropriate.    Results: Results for orders placed during the hospital encounter of 12/24/11 (from the past 24 hour(s))  BASIC METABOLIC PANEL     Status: Abnormal   Collection Time   12/24/11  12:45 AM      Component Value Range   Sodium 139  135 - 145 (mEq/L)   Potassium 4.2  3.5 - 5.1 (mEq/L)   Chloride 103  96 - 112 (mEq/L)   CO2 26  19 - 32 (mEq/L)   Glucose, Bld 88  70 - 99 (mg/dL)   BUN 15  6 - 23 (mg/dL)   Creatinine, Ser 1.61  0.50 - 1.10 (mg/dL)   Calcium 9.2  8.4 - 10.5 (mg/dL)   GFR calc non Af Amer 65 (*) >90 (mL/min)   GFR calc Af Amer 75 (*) >90 (mL/min)  CBC     Status: Abnormal   Collection Time   12/24/11 12:45 AM      Component Value Range   WBC 9.7  4.0 - 10.5 (K/uL)   RBC 4.17  3.87 - 5.11 (MIL/uL)   Hemoglobin 12.1  12.0 - 15.0 (g/dL)   HCT 16.1 (*) 09.6 - 46.0 (%)   MCV 85.4  78.0 - 100.0 (fL)   MCH 29.0  26.0 - 34.0 (pg)   MCHC 34.0  30.0 - 36.0 (g/dL)   RDW 04.5  40.9 - 81.1 (%)   Platelets 296  150 - 400 (K/uL)  DIFFERENTIAL     Status: Normal   Collection Time   12/24/11 12:45 AM      Component Value Range   Neutrophils Relative 55  43 - 77 (%)   Neutro Abs 5.3  1.7 - 7.7 (K/uL)   Lymphocytes Relative 34  12 - 46 (%)   Lymphs Abs 3.3  0.7 - 4.0 (K/uL)   Monocytes Relative 8  3 - 12 (%)   Monocytes Absolute 0.8  0.1 - 1.0 (K/uL)   Eosinophils Relative 3  0 - 5 (%)   Eosinophils Absolute 0.3  0.0 - 0.7 (K/uL)   Basophils Relative 0  0 - 1 (%)   Basophils Absolute 0.0  0.0 - 0.1 (K/uL)  POCT I-STAT TROPONIN I     Status: Normal   Collection Time   12/24/11 12:58 AM      Component Value Range   Troponin i, poc 0.03  0.00 - 0.08 (ng/mL)   Comment 3             CXR: clear EKG: 1st: NSR with aberrantly conducted PAC's and PVCs.            2nd: Afib without STT changes    Assessment/Plan: 51 yo WF with paroxysmal afib/aflutter s/p RFCA on 12/18/2011 who returns to the ED with symptomatic PACs/PVC's and developed recurrent afib with RVR requiring diltiazem ggt in ED. She is still within the window of her ablation where some arrhythmias would be expected.  Ideally would like to get her home later this AM. -  currently on dilt ggt - will monitor  in ED for a few hours as I suspect she will convert to NSR - if she does not convert, will tentatively plan to DCCV in ED later this AM and possibly discharge home - continue current dose of metoprolol - continue xarelto 20mg  daily (has not missed dose since ablation) - will discuss with Drs Johney Frame and Jens Som this AM any further plans, possibility of further antiarrhythmic therapy in the future, etc   Calie Buttrey 12/24/2011, 5:23 AM

## 2011-12-24 NOTE — ED Notes (Signed)
PT. REPORTS PALPITATIONS WITH LEFT SHOULDER PAIN ONSET LAST Sunday , STATES ABLATION LAST Friday AND WAS DISCHARGE LAST Saturday, DENIES SOB OR NAUSEA.

## 2011-12-24 NOTE — ED Notes (Signed)
Cardiology pa finally here to see pt states that dr taylor will be down in an hour

## 2011-12-24 NOTE — ED Notes (Signed)
Pt now in atrial fib with RVR rate b/w 119-148; repeat EKG done - Dr.Linker aware; denies any c/o pain at this time - skin warm, dry

## 2011-12-24 NOTE — ED Notes (Signed)
Dr Charm Barges plans to wait several hours to see if pt converts to nsr on her own.  If pt does not, he may attempt to shock her.  However, he would like to speak with Dr Vassie Loll first.

## 2011-12-24 NOTE — ED Notes (Signed)
Had cardiac ablation May 3, subsequently developed alternating periods of tachycardia, bradycardia, and irregular heart beat; also endorses left breast pain rad into left shoulder blade as well as slight SOB; presently reports worse pain is in left shoulder blade; however, does report heaviness to left breast

## 2011-12-24 NOTE — Discharge Instructions (Signed)
Atrial Fibrillation Your caregiver has diagnosed you with atrial fibrillation (AFib). The heart normally beats very regularly; AFib is a type of irregular heartbeat. The heart rate may be faster or slower than normal. This can prevent your heart from pumping as well as it should. AFib can be constant (chronic) or intermittent (paroxysmal). CAUSES  Atrial fibrillation may be caused by:  Heart disease, including heart attack, coronary artery disease, heart failure, diseases of the heart valves, and others.   Blood clot in the lungs (pulmonary embolism).   Pneumonia or other infections.   Chronic lung disease.   Thyroid disease.   Toxins. These include alcohol, some medications (such as decongestant medications or diet pills), and caffeine.  In some people, no cause for AFib can be found. This is referred to as Lone Atrial Fibrillation. SYMPTOMS   Palpitations or a fluttering in your chest.   A vague sense of chest discomfort.   Shortness of breath.   Sudden onset of lightheadedness or weakness.  Sometimes, the first sign of AFib can be a complication of the condition. This could be a stroke or heart failure. DIAGNOSIS  Your description of your condition may make your caregiver suspicious of atrial fibrillation. Your caregiver will examine your pulse to determine if fibrillation is present. An EKG (electrocardiogram) will confirm the diagnosis. Further testing may help determine what caused you to have atrial fibrillation. This may include chest x-ray, echocardiogram, blood tests, or CT scans. PREVENTION  If you have previously had atrial fibrillation, your caregiver may advise you to avoid substances known to cause the condition (such as stimulant medications, and possibly caffeine or alcohol). You may be advised to use medications to prevent recurrence. Proper treatment of any underlying condition is important to help prevent recurrence. PROGNOSIS  Atrial fibrillation does tend to  become a chronic condition over time. It can cause significant complications (see below). Atrial fibrillation is not usually immediately life-threatening, but it can shorten your life expectancy. This seems to be worse in women. If you have lone atrial fibrillation and are under 60 years old, the risk of complications is very low, and life expectancy is not shortened. RISKS AND COMPLICATIONS  Complications of atrial fibrillation can include stroke, chest pain, and heart failure. Your caregiver will recommend treatments for the atrial fibrillation, as well as for any underlying conditions, to help minimize risk of complications. TREATMENT  Treatment for AFib is divided into several categories:  Treatment of any underlying condition.   Converting you out of AFib into a regular (sinus) rhythm.   Controlling rapid heart rate.   Prevention of blood clots and stroke.  Medications and procedures are available to convert your atrial fibrillation to sinus rhythm. However, recent studies have shown that this may not offer you any advantage, and cardiac experts are continuing research and debate on this topic. More important is controlling your rapid heartbeat. The rapid heartbeat causes more symptoms, and places strain on your heart. Your caregiver will advise you on the use of medications that can control your heart rate. Atrial fibrillation is a strong stroke risk. You can lessen this risk by taking blood thinning medications such as Coumadin (warfarin), or sometimes aspirin. These medications need close monitoring by your caregiver. Over-medication can cause bleeding. Too little medication may not protect against stroke. HOME CARE INSTRUCTIONS   If your caregiver prescribed medicine to make your heartbeat more normally, take as directed.   If blood thinners were prescribed by your caregiver, take EXACTLY as directed.     Perform blood tests EXACTLY as directed.   Quit smoking. Smoking increases your  cardiac and lung (pulmonary) risks.   DO NOT drink alcohol.   DO NOT drink caffeinated drinks (e.g. coffee, soda, chocolate, and leaf teas). You may drink decaffeinated coffee, soda or tea.   If you are overweight, you should choose a reduced calorie diet to lose weight. Please see a registered dietitian if you need more information about healthy weight loss. DO NOT USE DIET PILLS as they may aggravate heart problems.   If you have other heart problems that are causing AFib, you may need to eat a low salt, fat, and cholesterol diet. Your caregiver will tell you if this is necessary.   Exercise every day to improve your physical fitness. Stay active unless advised otherwise.   If your caregiver has given you a follow-up appointment, it is very important to keep that appointment. Not keeping the appointment could result in heart failure or stroke. If there is any problem keeping the appointment, you must call back to this facility for assistance.  SEEK MEDICAL CARE IF:  You notice a change in the rate, rhythm or strength of your heartbeat.   You develop an infection or any other change in your overall health status.  SEEK IMMEDIATE MEDICAL CARE IF:   You develop chest pain, abdominal pain, sweating, weakness or feel sick to your stomach (nausea).   You develop shortness of breath.   You develop swollen feet and ankles.   You develop dizziness, numbness, or weakness of your face or limbs, or any change in vision or speech.  MAKE SURE YOU:   Understand these instructions.   Will watch your condition.   Will get help right away if you are not doing well or get worse.  Document Released: 08/03/2005 Document Revised: 07/23/2011 Document Reviewed: 03/07/2008 [employer] Patient Information 2012 [employer]. 

## 2011-12-24 NOTE — ED Notes (Signed)
Pt denies itching to L arm at this time.

## 2011-12-24 NOTE — Consult Note (Signed)
ELECTROPHYSIOLOGY CONSULT NOTE  Patient ID: Destiny Dennis MRN: [id number], DOB/AGE: 1961-07-14   Admit date: 12/24/2011 Date of Consult: 12/24/2011  Primary Physician: Judie Petit, MD, MD Primary Cardiologist: Hillis Range, MD Reason for Consultation: atrial fibrillation  History of Present Illness Destiny Dennis is a 51 year old woman with paroxysmal afib/flutter s/p radiofrequency ablation of afib/flutter 5 days ago who presents to the ED with dizziness and palpitations. For the last few nights she has noted her heart rate being really slow and she feels an uncomfortable palpitation in her chest. She reports her "heart seems to skip a beat." She has not had any chest pain or SOB. She denies dizziness, near-syncope or syncope. She denies LE swelling, orthopnea or PND. She reports compliance with her medications and has been on Xarelto daily since her ablation. While in the ED, she developed rapid atrial fibrillation and was started on IV diltiazem infusion. She is concerned that she feels bad even when she is in normal sinus rhythm. Of note, prior to her ablation, her symptoms were palpitations and fatigue. She did not tolerate flecainide due to nausea and dizziness.  Past Medical History  Diagnosis Date  . Atrial fibrillation and atrial flutter 03/05/2007    a. s/p RFCA 12/18/2011  . Eustachian tube dysfunction 03/18/2010  . GERD 12/23/2009  . Sialoadenitis 03/18/2010  . Hyperlipidemia   . PVCs. PACs   . History of MRSA infection 2008  . Allergic rhinitis   . Cervical disc disease   . Benign fundic gland polyps of stomach   . Hiatal hernia   . Diverticulitis   . Hemorrhoids     Past Surgical History  Procedure Date  . Knee surgery     x 9 Left Knee  . Cholecystectomy, laparoscopic 2008  . Upper gastrointestinal endoscopy 01/30/2010    hiatal hernia, fundic gland polyps  . Shoulder surgery     Right   . Elbow surgery     Right  . Foot surgery     Right   . Cervical fusion   .  Cesarean section     x 2  . Wrist surgery     Left   . Cholecystectomy   . Flexible sigmoidoscopy     1990's  . Colonoscopy 07/16/2011    diverticulosis  . TEE without cardioversion prior to RFCA 12/17/2011    Allergies/Intolerances Allergies  Allergen Reactions  . Avelox (Moxifloxacin Hcl In Nacl) Other (See Comments)    Numbness & tingling Peripheral neuropathy    Home Medications Medication List  As of 12/24/2011 10:22 AM     calcium-vitamin D 250-125 MG-UNIT per tablet   Commonly known as: OSCAL WITH D   Take 1 tablet by mouth daily.      co-enzyme Q-10 50 MG capsule   Take 50 mg by mouth daily.      diazepam 5 MG tablet   Commonly known as: VALIUM   Take 1 tablet by mouth at bedtime as needed. For sleep      estradiol 0.1 MG/24HR   Commonly known as: VIVELLE-DOT   Place 1 patch (0.1 mg total) onto the skin 2 (two) times a week.      famotidine-calcium carbonate-magnesium hydroxide 10-800-165 MG Chew   Commonly known as: PEPCID COMPLETE   Chew 1 tablet by mouth daily as needed. Acid reflux      fish oil-omega-3 fatty acids 1000 MG capsule   Take 2 g by mouth daily.  magnesium citrate 100 MG Tabs   Take 100 mg by mouth daily.      metoprolol succinate 100 MG 24 hr tablet   Commonly known as: TOPROL-XL   Take 100 mg by mouth every evening.      mulitivitamin with minerals Tabs   Take 1 tablet by mouth daily.      pantoprazole 40 MG tablet   Commonly known as: PROTONIX   Take 1 tablet (40 mg total) by mouth daily.      progesterone 100 MG capsule   Commonly known as: PROMETRIUM   Take 1 capsule (100 mg total) by mouth daily.      rivaroxaban 20 MG Tabs   Take 20 mg by mouth daily.      Inpatient Medications . diltiazem (CARDIZEM) infusion  5-15 mg/hr Intravenous Once   Family History Problem Relation Age of Onset  . Diabetes Maternal Grandfather   . Colon cancer Neg Hx   . Diverticulitis Brother     x 2  . Diverticulitis Paternal Grandmother      Social History Social History  . Marital Status: Married    Spouse Name: N/A    Number of Children: 2  . Years of Education: N/A   Occupational History  . [employer]     Workers Comp   Social History Main Topics  . Smoking status: Never Smoker   . Smokeless tobacco: Never Used  . Alcohol Use: No  . Drug Use: No  . Sexually Active: Not on file     has periods q 3-4 months- no possiblity pregnant per pt.07-16-11   Review of Systems General:  No chills, fever, night sweats or weight changes.  Cardiovascular:  No chest pain, dyspnea on exertion, edema, orthopnea, palpitations, paroxysmal nocturnal dyspnea. Dermatological: No rash, lesions/masses Respiratory: No cough, dyspnea Urologic: No hematuria, dysuria Abdominal:   No nausea, vomiting, diarrhea, bright red blood per rectum, melena, or hematemesis Neurologic:  No visual changes, wkns, changes in mental status. All other systems reviewed and are otherwise negative except as noted above.  Physical Exam Blood pressure 112/69, pulse 65, temperature 97.6 F (36.4 C), temperature source Oral, resp. rate 22, last menstrual period 04/18/2011, SpO2 95.00%.  General: Well developed, well appearing 51 year old female, in no acute distress. HEENT: Normocephalic, atraumatic. EOMs intact. Sclera nonicteric. Oropharynx clear.  Neck: Supple without bruits. No JVD. Lungs:  Respirations regular and unlabored, CTA bilaterally. Heart: RRR. S1, S2 present. No murmurs, rub, S3 or S4. Abdomen: Soft, non-tender, non-distended. BS present x 4 quadrants. No hepatosplenomegaly.  Extremities: No clubbing, cyanosis or edema. DP/PT/Radials 2+ and equal bilaterally. Psych: Normal affect. Neuro: Alert and oriented X 3. Moves all extremities spontaneously.   Labs Lab Results  Component Value Date   WBC 9.7 12/24/2011   HGB 12.1 12/24/2011   HCT 35.6* 12/24/2011   MCV 85.4 12/24/2011   PLT 296 12/24/2011    Lab 12/24/11 0045  NA 139  K 4.2  CL  103  CO2 26  BUN 15  CREATININE 1.01  CALCIUM 9.2  PROT --  BILITOT --  ALKPHOS --  ALT --  AST --  GLUCOSE 88   No components found with this basename: MAGNESIUM  Radiology/Studies Dg Chest 2 View  12/24/2011  *RADIOLOGY REPORT*  Clinical Data: Shortness of breath.  Irregular heart rate.  CHEST - 2 VIEW  Comparison: 02/25/2006  Findings: The cardiopericardial silhouette is enlarged. The lungs are clear without focal infiltrate, edema, pneumothorax or  pleural effusion. Imaged bony structures of the thorax are intact.  The patient is status post lower cervical anterior fusion.  IMPRESSION: Stable.  No acute cardiopulmonary process.  Original Report Authenticated By: ERIC A. MANSELL, M.D.   12-lead ECG  - atrial fibrillation with an RVR  Telemetry - Atrial fib with an RVR transitioning to NSR with a 2.7 sec pause  Assessment and Plan 1. Recurrent atrial fib after recent catheter ablation - I have reminded the patient that recurrent atrial fib after ablation is expected. She has returned to NSR. She will be discharged with the instruction to continue her Toprol and we will add short acting diltizem to take as needed for recurrent symptoms.   Signed, EDMISTEN, BROOKE O., PA-C 12/24/2011, 10:10 AM  EP Attending  Patient seen and examined. Asked by Dr. Jens Som to see. She has had recurrent atrial fib after ablation. She has returned to NSR. We will start her on short acting diltiazem 60 mg to be taken as needed for recurrent atrial fib. She will follow up with Dr. Johney Frame as scheduled. No other additional rec's.   Lewayne Bunting, M.D.

## 2011-12-24 NOTE — ED Notes (Signed)
Pt ambulatory up to br heart rate 72 nsr cardizem drip off at 0913 per dr plunkett order

## 2011-12-24 NOTE — ED Notes (Signed)
Spoke to trish w/ [place] [facility] and she states that dr Ladona Ridgel will be down to see pt soon and it was dr Johney Frame that is primary cards dr

## 2011-12-24 NOTE — ED Provider Notes (Signed)
History     CSN: [id number]  Arrival date & time 12/24/11  0018   First MD Initiated Contact with Patient 12/24/11 0031      Chief Complaint  Patient presents with  . Palpitations    (Consider location/radiation/quality/duration/timing/severity/associated sxs/prior treatment) HPI Patient presents with complaint of palpitations. She states that she had an ablation last week for atrial fibrillation and was feeling improved on the day of discharge however when she arrived at home she began to feel that her atrial fibrillation had recurred. She felt rapid palpitations over the past several days. Today also she noted a change in the type of palpitation and it was more of an alternating sensation between slow and fast. She did report feeling mild dizziness today. She denies chest pain or difficulty breathing. She has not syncopized. She has been taking her metoprolol as recommended. She called the cardiology fellow on call tonight and he recommended that she come to the ED for admission.  There are no alleviating or modifying factors, there are no other associated systemic symptoms.    Past Medical History  Diagnosis Date  . Atrial fibrillation 03/05/2007    a. s/p RFCA 12/18/2011  . 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 GERD 12/23/2009  . Sialoadenitis 03/18/2010  . Hyperlipidemia   . PVC (premature ventricular contraction)   . History of MRSA infection 2008  . Allergic rhinitis   . Cervical disc disease   . Benign fundic gland polyps of stomach   . Hiatal hernia   . Diverticulitis     CT Scan   . Hemorrhoids   . Atrial flutter     a. s/p RFCA 12/18/2011    Past Surgical History  Procedure Date  . Knee surgery     x 9 Left Knee  . Cholecystectomy, laparoscopic 2008  . Upper gastrointestinal endoscopy 01/30/2010    hiatal hernia, fundic gland polyps  . Shoulder surgery     Right   . Elbow surgery     Right  . Foot surgery     Right   . Cervical fusion   . Cesarean section      x 2  . Wrist surgery     Left   . Cholecystectomy   . Flexible sigmoidoscopy     1990's  . Colonoscopy 07/16/2011    diverticulosis  . Tee without cardioversion 12/17/2011    Procedure: TRANSESOPHAGEAL ECHOCARDIOGRAM (TEE);  Surgeon: Laurey Morale, MD;  Location: [facility] ENDOSCOPY;  Service: Cardiovascular;  Laterality: N/A;    Family History  Problem Relation Age of Onset  . Diabetes Maternal Grandfather   . Colon cancer Neg Hx   . Diverticulitis Brother     x 2  . Diverticulitis Paternal Grandmother     History  Substance Use Topics  . Smoking status: Never Smoker   . Smokeless tobacco: Never Used  . Alcohol Use: No    OB History    Grav Para Term Preterm Abortions TAB SAB Ect Mult Living                  Review of Systems ROS reviewed and all otherwise negative except for mentioned in HPI  Allergies  Avelox  Home Medications   Current Outpatient Rx  Name Route Sig Dispense Refill  . CALCIUM CARBONATE-VITAMIN D 250-125 MG-UNIT PO TABS Oral Take 1 tablet by mouth daily.    Marland Kitchen CO-ENZYME Q-10 50 MG PO CAPS Oral Take 50 mg by mouth daily.    Marland Kitchen  DIAZEPAM 5 MG PO TABS Oral Take 1 tablet by mouth at bedtime as needed. For sleep    . ESTRADIOL 0.1 MG/24HR TD PTTW Transdermal Place 1 patch (0.1 mg total) onto the skin 2 (two) times a week. 24 patch 12  . FAMOTIDINE-CA CARB-MAG HYDROX 10-800-165 MG PO CHEW Oral Chew 1 tablet by mouth daily as needed. Acid reflux    . OMEGA-3 FATTY ACIDS 1000 MG PO CAPS Oral Take 2 g by mouth daily.    Marland Kitchen MAGNESIUM CITRATE 100 MG PO TABS Oral Take 100 mg by mouth daily.    Marland Kitchen METOPROLOL SUCCINATE ER 100 MG PO TB24 Oral Take 100 mg by mouth every evening.    . ADULT MULTIVITAMIN W/MINERALS CH Oral Take 1 tablet by mouth daily.    Marland Kitchen PANTOPRAZOLE SODIUM 40 MG PO TBEC Oral Take 1 tablet (40 mg total) by mouth daily. 30 tablet 2  . SYSTANE FREE OP Both Eyes Place 1 drop into both eyes every other day.    Marland Kitchen PROGESTERONE MICRONIZED 100 MG PO CAPS  Oral Take 1 capsule (100 mg total) by mouth daily. 100 capsule 5  . RIVAROXABAN 20 MG PO TABS Oral Take 20 mg by mouth daily. 30 tablet 11  . DILTIAZEM HCL 60 MG PO TABS Oral Take 1 tablet (60 mg total) by mouth daily as needed (for palpitations). 30 tablet 1    BP 114/61  Pulse 66  Temp(Src) 97.6 F (36.4 C) (Oral)  Resp 17  SpO2 99%  LMP 04/18/2011 Vitals reviewed Physical Exam Physical Examination: General appearance - alert, well appearing, and in no distress Mental status - alert, oriented to person, place, and time Eyes - pupils equal and reactive, extraocular eye movements intact Mouth - mucous membranes moist, pharynx normal without lesions Chest - clear to auscultation, no wheezes, rales or rhonchi, symmetric air entry Heart - normal rate, irregular rhythm, normal S1, S2, no murmurs, rubs, clicks or gallops Abdomen - soft, nontender, nondistended, no masses or organomegaly Neurological - alert, oriented, normal speech, no focal findings or movement disorder noted Extremities - peripheral pulses normal, no pedal edema, no clubbing or cyanosis Skin - normal coloration and turgor, no rashes Psych- normal mood and affect  ED Course  Procedures (including critical care time)  3:36 AM  D/w Dr. Charm Barges, cardiology fellow- he recommends starting diltiazem drip- he will see patient in the ED, if she returns to sinus rhythm may be able to be discharged, but he will consult.     Date: 12/24/2011, 0228  Rate: 84  Rhythm: Sinus rhythm with Premature ventricular complexes or Fusion complexes  QRS Axis: normal  Intervals: normal  ST/T Wave abnormalities: normal  Conduction Disutrbances:none  Narrative Interpretation:   Old EKG Reviewed: none available   Date: 12/24/2011, 0243  Rate: 118  Rhythm: rapid atrial fibrillation  QRS Axis: normal  Intervals: indeterminate  ST/T Wave abnormalities: nonspecific ST/T changes  Conduction Disutrbances:none  Narrative Interpretation:    Old EKG Reviewed: changes noted  5:26 AM  Dr. Charm Barges, cardiology, has seen patient in ED.  He requests that she be held in ED on diltiazem drip- he will pass on to oncoming cardiology team and Dr. Johney Frame- if approx 7 or 8am she is still in afib they will consider cardioversion in ED.  He would like to avoid admission if possible.  Pt was updated about this plan.  She remains in afib on monitor at this time, but is otherwise asymptomatic, maintaining blood pressure.  Labs Reviewed  BASIC METABOLIC PANEL - Abnormal; Notable for the following:    GFR calc non Af Amer 65 (*)    GFR calc Af Amer 75 (*)    All other components within normal limits  CBC - Abnormal; Notable for the following:    HCT 35.6 (*)    All other components within normal limits  DIFFERENTIAL  POCT I-STAT TROPONIN I  MAGNESIUM  LAB REPORT - SCANNED   No results found.   1. Intermittent atrial fibrillation       MDM  Patient presenting with palpitations after ablation several days ago. Initially her rhythm was irregular but not atrial fibrillation with PVCs. While in the emergency department she began to have rapid atrial fibrillation. Cardiology was consult and recommended diltiazem drip. She is currently in atrial fibrillation on the diltiazem drip. Cardiology would like to observe her for a while in the emergency department and then if she remains in atrial fibrillation and they may consider cardioversion or some other intervention versus admission.        Ethelda Chick, MD 12/26/11 1122

## 2011-12-24 NOTE — ED Provider Notes (Signed)
At 9:15 patient was in a normal sinus rhythm and her dilt drip was DC'd. Dr. Ladona Ridgel with cardiology is to come and see the patient.  Gwyneth Sprout, MD 12/24/11 [phone]

## 2011-12-24 NOTE — ED Notes (Signed)
Pt HR in 80's - 101, AFIB.  Pt continues to deny chest pain.

## 2011-12-24 NOTE — ED Notes (Signed)
Pt sleeping.  Diltiazem infusing and hr continues in afib with a rate in the 70's and 80's.

## 2012-01-01 ENCOUNTER — Telehealth: Payer: Self-pay | Admitting: Internal Medicine

## 2012-01-01 DIAGNOSIS — I4891 Unspecified atrial fibrillation: Secondary | ICD-10-CM

## 2012-01-01 NOTE — Telephone Encounter (Signed)
Dr Ladona Ridgel saw her in the ER.  She feels like she is worse than before the ablation.  She has only had 2 days of normal rhythm .  She is normal now but having lots of PAC's.  1 every 30 beats.  She feels like she is worse and no better.  She is just concerned.  Should I have her come in for a 48 hour monitor?  She is taking Toprol 100mg  daily and has Cardizem to take prn for afib episodes.  Now she wants to now what to do for the PAC's she is feeling.  Please advise

## 2012-01-01 NOTE — Telephone Encounter (Signed)
Patient had an ablation 2 wks ago, who  Like a return call at [phone] as she had to go back to ER last Thursday.

## 2012-01-02 NOTE — Telephone Encounter (Signed)
Please inform the patient that it is not uncommon to have increasing afib after ablation as the atria are healing. She will require an antiarrhythmic medicine. She has not tolerated flecainide in the past. I would recommend rhythmol SR 225mg  BID

## 2012-01-04 ENCOUNTER — Encounter: Payer: Self-pay | Admitting: Internal Medicine

## 2012-01-04 MED ORDER — PROPAFENONE HCL 225 MG PO TABS: 225.0000 mg | ORAL_TABLET | Freq: Three times a day (TID) | ORAL | Status: DC

## 2012-01-04 MED ORDER — DILTIAZEM HCL ER COATED BEADS 120 MG PO CP24: 120.0000 mg | ORAL_CAPSULE | Freq: Every day | ORAL | Status: DC

## 2012-01-04 NOTE — Telephone Encounter (Signed)
Dr Johney Frame reviewed her EKG  She is in afib w RVR  We will start her on Cardizem 120mg  daily and Rhythmol 225mg  bid

## 2012-01-04 NOTE — Telephone Encounter (Signed)
Pt calling re last message, told pt what dr allred recommended , she has a couple more questions pls call [phone]

## 2012-01-04 NOTE — Telephone Encounter (Signed)
Patient has been in afib since 3am  She is going to come by for an EKG  We will address and call in new rx for her then

## 2012-01-11 ENCOUNTER — Telehealth: Payer: Self-pay | Admitting: Physician Assistant

## 2012-01-11 NOTE — Telephone Encounter (Signed)
Pt has had palpitations and arrhythmia, most recently atrial fib. She was started on Rhythmol and cardizem on 5/20. She has had tachypalpitations and slow heart rates, as low as 28 by her count. She has had a lot of dizzyness and presyncope, to the point that she was unable to drive.   Discussed the situation with her. She was reluctant to come to the hospital and wanted to manipulate medications and wait till the office opens. I advised her that her symptoms were significant enough and severe enough that I could only recommend hospital evaluation.

## 2012-01-12 ENCOUNTER — Telehealth: Payer: Self-pay | Admitting: Internal Medicine

## 2012-01-12 NOTE — Telephone Encounter (Signed)
New Problem:    Patient called in because the medications she was prescribed last Monday and one of them are dropping her blood pressure really low and she feels dizzy and like she is going to pass out.  Please call back.

## 2012-01-12 NOTE — Telephone Encounter (Signed)
Spoke with patient.  I have advised her to stop the Cardizem for now and let me discuss with Dr Johney Frame.  She says her HR is low and she feels like she is going to pass out at times.  Her heart is still going in and out of rhythm.  I let her know to hold Cardizem for now and will discuss with Dr Johney Frame tomorrow

## 2012-01-13 NOTE — Telephone Encounter (Signed)
She is going to call me back if anything changes

## 2012-01-13 NOTE — Telephone Encounter (Signed)
Spoke with patient.  Today she has not had any dizziness and feels better.

## 2012-01-14 ENCOUNTER — Telehealth: Payer: Self-pay | Admitting: Internal Medicine

## 2012-01-14 NOTE — Telephone Encounter (Signed)
Called patient back and left her a message to call me back  

## 2012-01-14 NOTE — Telephone Encounter (Signed)
New Problem: ° ° ° °Patient called in wanting to speak with you about her medications.  Please call back. °

## 2012-01-14 NOTE — Telephone Encounter (Signed)
Fu call °Patient returning your call °

## 2012-01-14 NOTE — Telephone Encounter (Signed)
Had a good day yesterday  She is not taking the Cardizem  Yesterday her heart rate was 38.  She is out of rhythm now since 2:45 and is going to take the Cardizem 60mg  for her afib I will call her tomorrow to discuss

## 2012-01-18 ENCOUNTER — Ambulatory Visit (INDEPENDENT_AMBULATORY_CARE_PROVIDER_SITE_OTHER): Payer: BC Managed Care – PPO | Admitting: Internal Medicine

## 2012-01-18 ENCOUNTER — Encounter: Payer: Self-pay | Admitting: Internal Medicine

## 2012-01-18 VITALS — BP 120/66 | HR 74 | Ht 71.0 in | Wt 255.8 lb

## 2012-01-18 DIAGNOSIS — I4891 Unspecified atrial fibrillation: Secondary | ICD-10-CM

## 2012-01-18 LAB — CBC WITH DIFFERENTIAL/PLATELET
Basophils Absolute: 0 10*3/uL (ref 0.0–0.1)
Eosinophils Relative: 2.2 % (ref 0.0–5.0)
HCT: 35.5 % — ABNORMAL LOW (ref 36.0–46.0)
Hemoglobin: 11.9 g/dL — ABNORMAL LOW (ref 12.0–15.0)
Lymphocytes Relative: 24.5 % (ref 12.0–46.0)
Monocytes Relative: 4.7 % (ref 3.0–12.0)
Neutro Abs: 4.7 10*3/uL (ref 1.4–7.7)
Platelets: 268 10*3/uL (ref 150.0–400.0)
RDW: 13.8 % (ref 11.5–14.6)
WBC: 6.8 10*3/uL (ref 4.5–10.5)

## 2012-01-18 LAB — BASIC METABOLIC PANEL
CO2: 25 mEq/L (ref 19–32)
Chloride: 104 mEq/L (ref 96–112)
Glucose, Bld: 127 mg/dL — ABNORMAL HIGH (ref 70–99)
Sodium: 138 mEq/L (ref 135–145)

## 2012-01-18 LAB — HEPATIC FUNCTION PANEL
ALT: 17 U/L (ref 0–35)
Albumin: 3.9 g/dL (ref 3.5–5.2)
Alkaline Phosphatase: 81 U/L (ref 39–117)
Bilirubin, Direct: 0.1 mg/dL (ref 0.0–0.3)
Total Protein: 6.8 g/dL (ref 6.0–8.3)

## 2012-01-18 LAB — T4, FREE: Free T4: 0.84 ng/dL (ref 0.60–1.60)

## 2012-01-18 LAB — TSH: TSH: 0.8 u[IU]/mL (ref 0.35–5.50)

## 2012-01-18 MED ORDER — AMIODARONE HCL 200 MG PO TABS: ORAL_TABLET | ORAL | Status: DC

## 2012-01-18 NOTE — Patient Instructions (Signed)
Your physician recommends that you schedule a follow-up appointment in: 6 weeks with Dr Johney Frame   Your physician recommends that you return for lab work today: CBC/BMP/Hepatic/TSH/ Free T4  Your physician has recommended you make the following change in your medication:  1) Stop Rhythmol 2) Start Amiodarone 200mg  take 2 tablets twice daily for 7 days then decrease to 200mg  twice daily

## 2012-01-18 NOTE — Assessment & Plan Note (Signed)
She has been having ERAF post ablation not improved with rhythmol. I will therefore stop rhythmol today. She will start amiodarone 400mg  BID x 7 days, then 200mg  BID thereafter. Risks, benefits, and alternatives to amiodarone were discussed at length today with pt and her spouse.  Liver, lung, and thyroid toxicity were specifically discussed. I will check LFTs and TFTs today.  Check BMET and CBC on xarelto.  Return in 6 weeks

## 2012-01-18 NOTE — Telephone Encounter (Signed)
Fu call Pt said she didn't have good weekend and wanted to talk to you abou a monitor

## 2012-01-18 NOTE — Telephone Encounter (Signed)
Will add on to see Dr Johney Frame today at 1:45

## 2012-01-18 NOTE — Progress Notes (Signed)
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  She has had increased afib (ERAF) post ablation.  She is still within the first 3 months of healing.  She also has PACs in bigeminy which she feels is quite concerning.  She denies procedure related complications. Today, she denies symptoms of chest pain, shortness of breath,  lower extremity edema, dizziness, presyncope, or syncope.  The patient is otherwise without complaint today.   Past Medical History  Diagnosis Date  . Atrial fibrillation 03/05/2007    a. s/p RFCA 12/18/2011  . 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 GERD 12/23/2009  . Sialoadenitis 03/18/2010  . Hyperlipidemia   . PVC (premature ventricular contraction)   . History of MRSA infection 2008  . Allergic rhinitis   . Cervical disc disease   . Benign fundic gland polyps of stomach   . Hiatal hernia   . Diverticulitis     CT Scan   . Hemorrhoids   . Atrial flutter     a. s/p RFCA 12/18/2011   Past Surgical History  Procedure Date  . Knee surgery     x 9 Left Knee  . Cholecystectomy, laparoscopic 2008  . Upper gastrointestinal endoscopy 01/30/2010    hiatal hernia, fundic gland polyps  . Shoulder surgery     Right   . Elbow surgery     Right  . Foot surgery     Right   . Cervical fusion   . Cesarean section     x 2  . Wrist surgery     Left   . Cholecystectomy   . Flexible sigmoidoscopy     1990's  . Colonoscopy 07/16/2011    diverticulosis  . Tee without cardioversion 12/17/2011    Procedure: TRANSESOPHAGEAL ECHOCARDIOGRAM (TEE);  Surgeon: Laurey Morale, MD;  Location: [facility] ENDOSCOPY;  Service: Cardiovascular;  Laterality: N/A;  . Atrial fibrillation and atrial flutter ablation 12/17/11    PVI and CTI ablations by Dr Johney Frame    Current Outpatient Prescriptions  Medication Sig Dispense Refill  . calcium-vitamin D (OSCAL WITH D) 250-125 MG-UNIT per  tablet Take 1 tablet by mouth daily.      Marland Kitchen co-enzyme Q-10 50 MG capsule Take 50 mg by mouth daily.      . diazepam (VALIUM) 5 MG tablet Take 1 tablet by mouth at bedtime as needed. For sleep      . estradiol (VIVELLE-DOT) 0.1 MG/24HR Place 1 patch (0.1 mg total) onto the skin 2 (two) times a week.  24 patch  12  . famotidine-calcium carbonate-magnesium hydroxide (PEPCID COMPLETE) 10-800-165 MG CHEW Chew 1 tablet by mouth daily as needed. Acid reflux      . fish oil-omega-3 fatty acids 1000 MG capsule Take 2 g by mouth daily.      . Magnesium Citrate 100 MG TABS Take 100 mg by mouth daily.      . metoprolol succinate (TOPROL-XL) 100 MG 24 hr tablet Take 100 mg by mouth every evening.      . Multiple Vitamin (MULITIVITAMIN WITH MINERALS) TABS Take 1 tablet by mouth daily.      . pantoprazole (PROTONIX) 40 MG tablet Take 1 tablet (40 mg total) by mouth daily.  30 tablet  2  . Polyethyl Glycol-Propyl Glycol (SYSTANE FREE OP) Place 1 drop into both eyes as needed.       . progesterone (PROMETRIUM) 100 MG  capsule Take 1 capsule (100 mg total) by mouth daily.  100 capsule  5  . Rivaroxaban (XARELTO) 20 MG TABS Take 20 mg by mouth daily.  30 tablet  11  . amiodarone (PACERONE) 200 MG tablet Take 2 tablets by mouth twice daily for one week then decrease to 200mg  twice daily  60 tablet  11  . diltiazem (CARDIZEM CD) 120 MG 24 hr capsule Take 1 capsule (120 mg total) by mouth daily.  90 capsule  3  . DISCONTD: flecainide (TAMBOCOR) 100 MG tablet Take 1 tablet (100 mg total) by mouth 2 (two) times daily.  60 tablet  12    Physical Exam: Filed Vitals:   01/18/12 1354  BP: 120/66  Pulse: 74  Height: 5\' 11"  (1.803 m)  Weight: 255 lb 12.8 oz (116.03 kg)    GEN- The patient is well appearing, alert and oriented x 3 today.   Head- normocephalic, atraumatic Eyes-  Sclera clear, conjunctiva pink Ears- hearing intact Oropharynx- clear Lungs- Clear to ausculation bilaterally, normal work of  breathing Heart- Regular rate and rhythm, no murmurs, rubs or gallops, PMI not laterally displaced GI- soft, NT, ND, + BS Extremities- no clubbing, cyanosis, or edema  ekg today reveals sinus rhythm A second ekg reveals sinus rhythm with PACs in bigeminy  Assessment and Plan:

## 2012-01-27 ENCOUNTER — Telehealth: Payer: Self-pay | Admitting: Internal Medicine

## 2012-01-27 NOTE — Telephone Encounter (Signed)
Spoke with patient and tried to reassure her after discussing with Dr Johney Frame.  She is still having episodes almost daily.  She has had 2 days since start of Amiodarone without afib.  She says prior to the ablation she only had 1 episode per month but now is having daily and is frustrated.  I have tried to let her know that during the first 3 months s/p ablation the heart is healing and this can be normal.  She is trying to cope with just wants to make sure this is normal

## 2012-01-27 NOTE — Telephone Encounter (Signed)
Pt having prob with a-fib and medication is not helping and she wants to talk to you about it

## 2012-03-02 ENCOUNTER — Ambulatory Visit (INDEPENDENT_AMBULATORY_CARE_PROVIDER_SITE_OTHER): Payer: BC Managed Care – PPO | Admitting: Internal Medicine

## 2012-03-02 ENCOUNTER — Encounter: Payer: Self-pay | Admitting: Internal Medicine

## 2012-03-02 VITALS — BP 135/86 | HR 54 | Ht 71.0 in | Wt 259.0 lb

## 2012-03-02 DIAGNOSIS — I4891 Unspecified atrial fibrillation: Secondary | ICD-10-CM

## 2012-03-02 LAB — HEPATIC FUNCTION PANEL
ALT: 25 U/L (ref 0–35)
AST: 17 U/L (ref 0–37)
Albumin: 4.3 g/dL (ref 3.5–5.2)
Alkaline Phosphatase: 77 U/L (ref 39–117)
Bilirubin, Direct: 0.1 mg/dL (ref 0.0–0.3)
Total Bilirubin: 0.7 mg/dL (ref 0.3–1.2)
Total Protein: 7.2 g/dL (ref 6.0–8.3)

## 2012-03-02 LAB — BASIC METABOLIC PANEL
BUN: 15 mg/dL (ref 6–23)
CO2: 25 mEq/L (ref 19–32)
Calcium: 9.4 mg/dL (ref 8.4–10.5)
Chloride: 105 mEq/L (ref 96–112)
Creatinine, Ser: 1.1 mg/dL (ref 0.4–1.2)
GFR: 59.23 mL/min — ABNORMAL LOW (ref >60.00)
Glucose, Bld: 101 mg/dL — ABNORMAL HIGH (ref 70–99)
Potassium: 4.2 mEq/L (ref 3.5–5.1)
Sodium: 140 mEq/L (ref 135–145)

## 2012-03-02 LAB — TSH: TSH: 2.18 u[IU]/mL (ref 0.35–5.50)

## 2012-03-02 MED ORDER — AMIODARONE HCL 200 MG PO TABS: ORAL_TABLET | ORAL | Status: DC

## 2012-03-02 NOTE — Progress Notes (Signed)
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  She has had afib (ERAF) post ablation.  She is still within the first 3 months of healing.  This continues to improve but has not resolved.  Today, she denies symptoms of chest pain, shortness of breath,  lower extremity edema, dizziness, presyncope, or syncope.  The patient is otherwise without complaint today.   Past Medical History  Diagnosis Date  . Atrial fibrillation 03/05/2007    a. s/p RFCA 12/18/2011  . 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 GERD 12/23/2009  . Sialoadenitis 03/18/2010  . Hyperlipidemia   . PVC (premature ventricular contraction)   . History of MRSA infection 2008  . Allergic rhinitis   . Cervical disc disease   . Benign fundic gland polyps of stomach   . Hiatal hernia   . Diverticulitis     CT Scan   . Hemorrhoids   . Atrial flutter     a. s/p RFCA 12/18/2011   Past Surgical History  Procedure Date  . Knee surgery     x 9 Left Knee  . Cholecystectomy, laparoscopic 2008  . Upper gastrointestinal endoscopy 01/30/2010    hiatal hernia, fundic gland polyps  . Shoulder surgery     Right   . Elbow surgery     Right  . Foot surgery     Right   . Cervical fusion   . Cesarean section     x 2  . Wrist surgery     Left   . Cholecystectomy   . Flexible sigmoidoscopy     1990's  . Colonoscopy 07/16/2011    diverticulosis  . Tee without cardioversion 12/17/2011    Procedure: TRANSESOPHAGEAL ECHOCARDIOGRAM (TEE);  Surgeon: Laurey Morale, MD;  Location: [facility] ENDOSCOPY;  Service: Cardiovascular;  Laterality: N/A;  . Atrial fibrillation and atrial flutter ablation 12/17/11    PVI and CTI ablations by Dr Johney Frame    Current Outpatient Prescriptions  Medication Sig Dispense Refill  . amiodarone (PACERONE) 200 MG tablet Take 2 tablets by mouth twice daily for one week then decrease to 200mg  twice daily  60 tablet   11  . calcium-vitamin D (OSCAL WITH D) 250-125 MG-UNIT per tablet Take 1 tablet by mouth daily.      Marland Kitchen co-enzyme Q-10 50 MG capsule Take 50 mg by mouth daily.      . diazepam (VALIUM) 5 MG tablet Take 1 tablet by mouth at bedtime as needed. For sleep      . estradiol (VIVELLE-DOT) 0.1 MG/24HR Place 1 patch (0.1 mg total) onto the skin 2 (two) times a week.  24 patch  12  . famotidine-calcium carbonate-magnesium hydroxide (PEPCID COMPLETE) 10-800-165 MG CHEW Chew 1 tablet by mouth daily as needed. Acid reflux      . fish oil-omega-3 fatty acids 1000 MG capsule Take 2 g by mouth daily.      . Magnesium Citrate 100 MG TABS Take 100 mg by mouth daily.      . metoprolol succinate (TOPROL-XL) 100 MG 24 hr tablet Take 100 mg by mouth every evening.      . Multiple Vitamin (MULITIVITAMIN WITH MINERALS) TABS Take 1 tablet by mouth daily.      . pantoprazole (PROTONIX) 40 MG tablet Take 1 tablet (40 mg total) by mouth daily.  30 tablet  2  . progesterone (PROMETRIUM) 100 MG capsule Take 1 capsule (  100 mg total) by mouth daily.  100 capsule  5  . Rivaroxaban (XARELTO) 20 MG TABS Take 20 mg by mouth daily.  30 tablet  11  . Polyethyl Glycol-Propyl Glycol (SYSTANE FREE OP) Place 1 drop into both eyes as needed.       Marland Kitchen DISCONTD: flecainide (TAMBOCOR) 100 MG tablet Take 1 tablet (100 mg total) by mouth 2 (two) times daily.  60 tablet  12    Physical Exam: Filed Vitals:   03/02/12 1057  BP: 135/86  Pulse: 54  Height: 5\' 11"  (1.803 m)  Weight: 259 lb (117.482 kg)  SpO2: 99%    GEN- The patient is well appearing, alert and oriented x 3 today.   Head- normocephalic, atraumatic Eyes-  Sclera clear, conjunctiva pink Ears- hearing intact Oropharynx- clear Lungs- Clear to ausculation bilaterally, normal work of breathing Heart- Regular rate and rhythm, no murmurs, rubs or gallops, PMI not laterally displaced GI- soft, NT, ND, + BS Extremities- no clubbing, cyanosis, or edema  ekg today reveals sinus  rhythm 52 bpm, otherwise normal ekg   Assessment and Plan:

## 2012-03-02 NOTE — Assessment & Plan Note (Signed)
Improving Decrease amiodarone to 200mg  daily Continue xarelto Check LFTs/TFTs today

## 2012-03-02 NOTE — Patient Instructions (Addendum)
Your physician recommends that you schedule a follow-up appointment in: 2 months with Dr Johney Frame   Your physician recommends that you return for lab work today  Your physician has recommended you make the following change in your medication:  1) Decrease Amiodarone to 200mg  daily

## 2012-03-16 ENCOUNTER — Ambulatory Visit: Payer: BC Managed Care – PPO | Admitting: Internal Medicine

## 2012-03-31 ENCOUNTER — Telehealth: Payer: Self-pay | Admitting: Obstetrics and Gynecology

## 2012-03-31 NOTE — Telephone Encounter (Signed)
TRIAGE/GENERAL QUEST. °

## 2012-03-31 NOTE — Telephone Encounter (Signed)
Spoke with pt rgd msg pt states perimenopause no cycle times 8 months now having full blown cycle pt states stopped vivelle dot about a week ago advised pt normally taper off hormones since she stopped abruptly hormone balance is thrown off this may be the reason for sudden cycle pt voice understanding

## 2012-04-11 ENCOUNTER — Telehealth: Payer: Self-pay | Admitting: Obstetrics and Gynecology

## 2012-04-12 ENCOUNTER — Encounter: Payer: Self-pay | Admitting: Obstetrics and Gynecology

## 2012-04-12 ENCOUNTER — Ambulatory Visit (INDEPENDENT_AMBULATORY_CARE_PROVIDER_SITE_OTHER): Payer: BC Managed Care – PPO | Admitting: Obstetrics and Gynecology

## 2012-04-12 VITALS — BP 124/76 | Temp 98.3°F | Resp 16 | Ht 71.0 in | Wt 257.0 lb

## 2012-04-12 DIAGNOSIS — N951 Menopausal and female climacteric states: Secondary | ICD-10-CM

## 2012-04-12 DIAGNOSIS — N926 Irregular menstruation, unspecified: Secondary | ICD-10-CM

## 2012-04-12 NOTE — Progress Notes (Signed)
HISTORY OF PRESENT ILLNESS  [name] is a 51 y.o. year old female,No obstetric history on file., who presents for a problem visit. The patient has a history of menopausal symptoms.  She also has a history of sinus V. Tach and a history of anxiety.  She has been treated with an estrogen patch and oral progesterone.  Subjective:  The patient reports that she had an irregular menstrual period last week.  This is the first menstrual cycle she has had in 10 months except for occasional spotting.  She complains of increased weight gain and a change in body habitus.  She complains of being short temper.  Objective:  BP 124/76  Temp 98.3 F (36.8 C) (Oral)  Resp 16  Ht 5\' 11"  (1.803 m)  Wt 257 lb (116.574 kg)  BMI 35.84 kg/m2  LMP 04/18/2011   General: alert and no distress Resp: clear to auscultation bilaterally Cardio: regular rate and rhythm, S1, S2 normal, no murmur, click, rub or gallop GI: soft and nontender  External genitalia: normal general appearance Vaginal: normal without tenderness, induration or masses and relaxation noted Cervix: normal appearance Adnexa: normal bimanual exam Uterus: upper limits normal size  Assessment:  Menopausal symptoms Sinus V. tach  Plan:  The menopause was discussed.  Treatment options were reviewed.  Risk and benefits were outlined.  The patient will keep a daily block of her symptoms.  We will discuss when she returns for her annual exam.  Return to office prn if symptoms worsen or fail to improve.   Leonard Schwartz M.D.  04/12/2012 6:38 PM    When did bleeding start: 1 week ago Thursday 03/31/2012 "Was bright red" How  Long: 5 days How often changing pad/tampon: 1 a day Bleeding Disorders: no Cramping: yes Contraception: no Fibroids: yes Hormone Therapy: yes "pt stated she had stopped on her own and has recently started back on them. New Medications: yes "Blood Thinner Menopausal Symptoms: yes Vag.  Discharge: yes Abdominal Pain: yes Increased Stress: no  Irreg Periods: yes Mood Swings: yes Hot Flashes: yes Vaginal Dryness: no Poor Sleeping: no Urinary Urgency: yes UTI Symptoms: no HRT: yes Fam Hx Osteo: no Prior Bone Scan: No Osteoporosis: No Hx of Dvt: Yes

## 2012-05-02 ENCOUNTER — Other Ambulatory Visit: Payer: Self-pay | Admitting: Cardiology

## 2012-05-04 ENCOUNTER — Encounter: Payer: Self-pay | Admitting: Internal Medicine

## 2012-05-04 ENCOUNTER — Ambulatory Visit (INDEPENDENT_AMBULATORY_CARE_PROVIDER_SITE_OTHER): Payer: BC Managed Care – PPO | Admitting: Internal Medicine

## 2012-05-04 VITALS — BP 130/86 | HR 63 | Ht 71.0 in | Wt 257.1 lb

## 2012-05-04 DIAGNOSIS — I4891 Unspecified atrial fibrillation: Secondary | ICD-10-CM

## 2012-05-04 LAB — CBC WITH DIFFERENTIAL/PLATELET
Eosinophils Relative: 2.9 % (ref 0.0–5.0)
HCT: 38.8 % (ref 36.0–46.0)
Hemoglobin: 13 g/dL (ref 12.0–15.0)
Lymphs Abs: 1.8 10*3/uL (ref 0.7–4.0)
Monocytes Relative: 7 % (ref 3.0–12.0)
Neutro Abs: 5.1 10*3/uL (ref 1.4–7.7)
RBC: 4.35 Mil/uL (ref 3.87–5.11)
WBC: 7.7 10*3/uL (ref 4.5–10.5)

## 2012-05-04 LAB — BASIC METABOLIC PANEL
Calcium: 9.3 mg/dL (ref 8.4–10.5)
Chloride: 104 mEq/L (ref 96–112)
Creatinine, Ser: 0.9 mg/dL (ref 0.4–1.2)

## 2012-05-04 LAB — T4, FREE: Free T4: 1.11 ng/dL (ref 0.60–1.60)

## 2012-05-04 LAB — HEPATIC FUNCTION PANEL
Bilirubin, Direct: 0.1 mg/dL (ref 0.0–0.3)
Total Bilirubin: 0.7 mg/dL (ref 0.3–1.2)

## 2012-05-04 MED ORDER — AMIODARONE HCL 200 MG PO TABS: 100.0000 mg | ORAL_TABLET | Freq: Every day | ORAL | Status: DC

## 2012-05-04 NOTE — Progress Notes (Signed)
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 female who presents today for routine electrophysiology followup.  Since last being seen in our clinic, the patient reports doing very well.  Today, she denies symptoms of palpitations, chest pain, shortness of breath,  lower extremity edema, dizziness, presyncope, or syncope.  The patient is otherwise without complaint today.   Past Medical History  Diagnosis Date  . Atrial fibrillation 03/05/2007    a. s/p RFCA 12/18/2011  . 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 GERD 12/23/2009  . Sialoadenitis 03/18/2010  . Hyperlipidemia   . PVC (premature ventricular contraction)   . History of MRSA infection 2008  . Allergic rhinitis   . Cervical disc disease   . Benign fundic gland polyps of stomach   . Hiatal hernia   . Diverticulitis     CT Scan   . Hemorrhoids   . Atrial flutter     a. s/p RFCA 12/18/2011   Past Surgical History  Procedure Date  . Knee surgery     x 9 Left Knee  . Cholecystectomy, laparoscopic 2008  . Upper gastrointestinal endoscopy 01/30/2010    hiatal hernia, fundic gland polyps  . Shoulder surgery     Right   . Elbow surgery     Right  . Foot surgery     Right   . Cervical fusion   . Cesarean section     x 2  . Wrist surgery     Left   . Cholecystectomy   . Flexible sigmoidoscopy     1990's  . Colonoscopy 07/16/2011    diverticulosis  . Tee without cardioversion 12/17/2011    Procedure: TRANSESOPHAGEAL ECHOCARDIOGRAM (TEE);  Surgeon: Laurey Morale, MD;  Location: [facility] ENDOSCOPY;  Service: Cardiovascular;  Laterality: N/A;  . Atrial fibrillation and atrial flutter ablation 12/17/11    PVI and CTI ablations by Dr Johney Frame    Current Outpatient Prescriptions  Medication Sig Dispense Refill  . amiodarone (PACERONE) 200 MG tablet Take 0.5 tablets (100 mg total) by mouth daily. Take one tablet by mouth daily  60 tablet  11  . calcium-vitamin D (OSCAL WITH D) 250-125  MG-UNIT per tablet Take 1 tablet by mouth daily.      Marland Kitchen co-enzyme Q-10 50 MG capsule Take 50 mg by mouth daily.      . diazepam (VALIUM) 5 MG tablet Take 1 tablet by mouth at bedtime as needed. For sleep      . estradiol (VIVELLE-DOT) 0.1 MG/24HR Place 1 patch (0.1 mg total) onto the skin 2 (two) times a week.  24 patch  12  . famotidine-calcium carbonate-magnesium hydroxide (PEPCID COMPLETE) 10-800-165 MG CHEW Chew 1 tablet by mouth daily as needed. Acid reflux      . fish oil-omega-3 fatty acids 1000 MG capsule Take 2 g by mouth daily.      . Magnesium Citrate 100 MG TABS Take 100 mg by mouth daily.      . metoprolol succinate (TOPROL-XL) 100 MG 24 hr tablet Take 100 mg by mouth every evening.      . metoprolol succinate (TOPROL-XL) 100 MG 24 hr tablet TAKE 1 TABLET BY MOUTH EVERY DAY  90 tablet  3  . Multiple Vitamin (MULITIVITAMIN WITH MINERALS) TABS Take 1 tablet by mouth daily.      Bertram Gala Glycol-Propyl Glycol (SYSTANE FREE OP) Place 1 drop into both eyes as needed.       Marland Kitchen  progesterone (PROMETRIUM) 100 MG capsule Take 1 capsule (100 mg total) by mouth daily.  100 capsule  5  . Rivaroxaban (XARELTO) 20 MG TABS Take 20 mg by mouth daily.  30 tablet  11  . DISCONTD: amiodarone (PACERONE) 200 MG tablet Take one tablet by mouth daily  60 tablet  11  . DISCONTD: flecainide (TAMBOCOR) 100 MG tablet Take 1 tablet (100 mg total) by mouth 2 (two) times daily.  60 tablet  12    Physical Exam: Filed Vitals:   05/04/12 0904  BP: 130/86  Pulse: 63  Height: 5\' 11"  (1.803 m)  Weight: 257 lb 1.9 oz (116.629 kg)  SpO2: 98%    GEN- The patient is well appearing, alert and oriented x 3 today.   Head- normocephalic, atraumatic Eyes-  Sclera clear, conjunctiva pink Ears- hearing intact Oropharynx- clear Lungs- Clear to ausculation bilaterally, normal work of breathing Heart- Regular rate and rhythm, no murmurs, rubs or gallops, PMI not laterally displaced GI- soft, NT, ND, + BS Extremities-  no clubbing, cyanosis, or edema  ekg today reveals sinus rhythm 48 bpm, PR 176, QRS 96, Qtc 414   Assessment and Plan:  Atrial fibrillation  No recent afib Decrease amiodarone to 100mg  daily  Continue xarelto  Check LFTs/TFTs today  Return in 3 months Stop amiodarone upon return if no further afib

## 2012-05-04 NOTE — Patient Instructions (Signed)
Your physician recommends that you schedule a follow-up appointment in: 3 months with Dr Johney Frame   Your physician recommends that you return for lab work today   Your physician has recommended you make the following change in your medication:  1) Decrease your Amiodarone to 100mg  daily

## 2012-05-09 ENCOUNTER — Telehealth: Payer: Self-pay | Admitting: Internal Medicine

## 2012-05-09 NOTE — Telephone Encounter (Signed)
New problem:   Patient C/O  dental pain over the weekend. office is requesting clearance for xarlto.

## 2012-05-09 NOTE — Telephone Encounter (Signed)
Ok to hold 24 hours prior and restart 24 hours after per Dr Johney Frame  lmom for patient

## 2012-05-19 ENCOUNTER — Telehealth: Payer: Self-pay | Admitting: Internal Medicine

## 2012-05-19 NOTE — Telephone Encounter (Signed)
Destiny Dennis has gone back to the full 100mg  of Amiodarone due to going back into afib Destiny Dennis wants me to ask Dr Johney Frame about joint pain and Amiodarone as Destiny Dennis has had 3 cortisone shots since starting

## 2012-05-19 NOTE — Telephone Encounter (Signed)
plz return call to patient [phone], she has questions regarding medication

## 2012-06-03 ENCOUNTER — Encounter: Payer: Self-pay | Admitting: Internal Medicine

## 2012-06-03 NOTE — Telephone Encounter (Signed)
Destiny Dennis 06/03/2012 12:55 PM Signed  Pt had 3 rounds of a-fib this week and she wants to talk about it

## 2012-06-03 NOTE — Telephone Encounter (Signed)
Has had 3 episodes this week of afib  Lasting 1 1/2 hours BP was 110/58.  She became lightheaded, just wants to know what to do from here.  She is very frustrated and needs guidance.  Dr Johney Frame can see her on Fri of next week to discuss issues

## 2012-06-03 NOTE — Telephone Encounter (Signed)
This encounter was created in error - please disregard.

## 2012-06-03 NOTE — Telephone Encounter (Signed)
Cortisone shot was 3 weeks ago  Will set up to see Dr Johney Frame on Fri

## 2012-06-03 NOTE — Telephone Encounter (Signed)
Pt had 3 rounds of a-fib this week and she wants to talk about it

## 2012-06-10 ENCOUNTER — Ambulatory Visit (INDEPENDENT_AMBULATORY_CARE_PROVIDER_SITE_OTHER): Payer: BC Managed Care – PPO | Admitting: Family Medicine

## 2012-06-10 ENCOUNTER — Ambulatory Visit (INDEPENDENT_AMBULATORY_CARE_PROVIDER_SITE_OTHER): Payer: BC Managed Care – PPO | Admitting: Internal Medicine

## 2012-06-10 ENCOUNTER — Encounter: Payer: Self-pay | Admitting: Family Medicine

## 2012-06-10 ENCOUNTER — Encounter: Payer: Self-pay | Admitting: Internal Medicine

## 2012-06-10 VITALS — BP 116/66 | HR 51 | Ht 71.0 in | Wt 260.0 lb

## 2012-06-10 VITALS — BP 118/70 | HR 75 | Temp 97.7°F | Wt 260.0 lb

## 2012-06-10 DIAGNOSIS — J029 Acute pharyngitis, unspecified: Secondary | ICD-10-CM

## 2012-06-10 DIAGNOSIS — J309 Allergic rhinitis, unspecified: Secondary | ICD-10-CM

## 2012-06-10 DIAGNOSIS — I4891 Unspecified atrial fibrillation: Secondary | ICD-10-CM

## 2012-06-10 NOTE — Progress Notes (Signed)
Chief Complaint  Patient presents with  . Otitis Media    sore throat; son has strep throat     HPI: ALLERGIC RHINITIS/STREP EXPOSURE: -exposure to strep from son and wants strep test -started: months ago -symptoms:nasal congestion, sneezing, watery eyes -denies:fever, SOB, NVD, tooth pain -has tried nasal steroid in past -sees allergic for her allergies, but hasn't seen him in a some time -allergy medication makes her drowsy  ROS: See pertinent positives and negatives per HPI.  Past Medical History  Diagnosis Date  . Atrial fibrillation 03/05/2007    a. s/p RFCA 12/18/2011  . 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 GERD 12/23/2009  . Sialoadenitis 03/18/2010  . Hyperlipidemia   . PVC (premature ventricular contraction)   . History of MRSA infection 2008  . Allergic rhinitis   . Cervical disc disease   . Benign fundic gland polyps of stomach   . Hiatal hernia   . Diverticulitis     CT Scan   . Hemorrhoids   . Atrial flutter     a. s/p RFCA 12/18/2011    Family History  Problem Relation Age of Onset  . Diabetes Maternal Grandfather   . Colon cancer Neg Hx   . Diverticulitis Brother     x 2  . Diverticulitis Paternal Grandmother     History   Social History  . Marital Status: Married    Spouse Name: N/A    Number of Children: 2  . Years of Education: N/A   Occupational History  . [employer]     Workers Comp   Social History Main Topics  . Smoking status: Never Smoker   . Smokeless tobacco: Never Used  . Alcohol Use: No  . Drug Use: No  . Sexually Active: None     has periods q 3-4 months- no possiblity pregnant per pt.07-16-11   Other Topics Concern  . None   Social History Narrative   Daily Caffeine Pt lives in [place] with spouse.  She works as a workers Runner, broadcasting/film/video.    Current outpatient prescriptions:amiodarone (PACERONE) 200 MG tablet, Take 200 mg by mouth daily., Disp: , Rfl: ;  calcium-vitamin D (OSCAL WITH D) 250-125 MG-UNIT  per tablet, Take 1 tablet by mouth daily., Disp: , Rfl: ;  co-enzyme Q-10 50 MG capsule, Take 50 mg by mouth daily., Disp: , Rfl: ;  diazepam (VALIUM) 5 MG tablet, Take 1 tablet by mouth at bedtime as needed. For sleep, Disp: , Rfl:  estradiol (VIVELLE-DOT) 0.1 MG/24HR, Place 1 patch (0.1 mg total) onto the skin 2 (two) times a week., Disp: 24 patch, Rfl: 12;  famotidine-calcium carbonate-magnesium hydroxide (PEPCID COMPLETE) 10-800-165 MG CHEW, Chew 1 tablet by mouth daily as needed. Acid reflux, Disp: , Rfl: ;  fish oil-omega-3 fatty acids 1000 MG capsule, Take 2 g by mouth daily., Disp: , Rfl: ;  levocetirizine (XYZAL) 5 MG tablet, Take 1 tablet by mouth daily., Disp: , Rfl:  Magnesium Citrate 100 MG TABS, Take 100 mg by mouth daily., Disp: , Rfl: ;  methocarbamol (ROBAXIN) 750 MG tablet, as needed., Disp: , Rfl: ;  metoprolol succinate (TOPROL-XL) 100 MG 24 hr tablet, TAKE 1 TABLET BY MOUTH EVERY DAY, Disp: 90 tablet, Rfl: 3;  Multiple Vitamin (MULITIVITAMIN WITH MINERALS) TABS, Take 1 tablet by mouth daily., Disp: , Rfl:  Polyethyl Glycol-Propyl Glycol (SYSTANE FREE OP), Place 1 drop into both eyes as needed. , Disp: , Rfl: ;  progesterone (PROMETRIUM) 100 MG capsule, Take  1 capsule (100 mg total) by mouth daily., Disp: 100 capsule, Rfl: 5;  Rivaroxaban (XARELTO) 20 MG TABS, Take 20 mg by mouth daily., Disp: 30 tablet, Rfl: 11 DISCONTD: amiodarone (PACERONE) 200 MG tablet, Take 0.5 tablets (100 mg total) by mouth daily. Take one tablet by mouth daily, Disp: 60 tablet, Rfl: 11;  DISCONTD: flecainide (TAMBOCOR) 100 MG tablet, Take 1 tablet (100 mg total) by mouth 2 (two) times daily., Disp: 60 tablet, Rfl: 12;  DISCONTD: metoprolol succinate (TOPROL-XL) 100 MG 24 hr tablet, Take 100 mg by mouth every evening., Disp: , Rfl:   EXAM:  Filed Vitals:   06/10/12 1504  BP: 118/70  Pulse: 75  Temp: 97.7 F (36.5 C)    There is no height on file to calculate BMI.  GENERAL: vitals reviewed and listed  above, alert, oriented, appears well hydrated and in no acute distress  HEENT: atraumatic, conjunttiva clear, no obvious abnormalities on inspection of external nose and ears, normal appearance of ear canals and TMs, clear nasal congestion, mild post oropharyngeal erythema with PND, no tonsillar edema or exudate, no sinus TTP  NECK: no obvious masses on inspection  LUNGS: clear to auscultation bilaterally, no wheezes, rales or rhonchi, good air movement  CV: HRRR, no peripheral edema  MS: moves all extremities without noticeable abnormality  PSYCH: pleasant and cooperative, no obvious depression or anxiety  ASSESSMENT AND PLAN:  Discussed the following assessment and plan:  1. Sore throat  POCT rapid strep A, Throat culture ([facility])  2. Allergic rhinitis     -allergic rhinitis out of control and she is not taking her oral medicaiton as it makes her drowsy and she is not sure if she can use nasal spray as occ get erosion of nasal mucosal. Advised trial of Claritin or allegra and follow up with her allergist to discuss nasal steroid. -pt also wants strep test as son recently had strep, rapid strep negative, culture pending. -Patient advised to return or notify a doctor immediately if symptoms worsen or persist or new concerns arise.  Patient Instructions  -nasal saline 2 times daily  -switch to Claritin or allegra to see if this is better in terms of drowsiness  -schedule appointment with allergist to discuss using nasal steroid  -humidifier at night  -we will contact you if strep is positive      Haliegh Khurana R.

## 2012-06-10 NOTE — Patient Instructions (Addendum)
-  nasal saline 2 times daily  -switch to Claritin or allegra to see if this is better in terms of drowsiness  -schedule appointment with allergist to discuss using nasal steroid  -humidifier at night  -we will contact you if strep is positive

## 2012-06-10 NOTE — Patient Instructions (Addendum)
Cleared for surgery and will hold Xarelto for 48 hours prior to surgery.    Dr Ranell Patrick at [facility] doing surgery  Will fax over Dr Jenel Lucks note  Your physician recommends that you schedule a follow-up appointment in: 3-4 weeks with Dr Johney Frame   Will look at first week in Dec for ablation

## 2012-06-12 NOTE — Progress Notes (Signed)
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  Since last being seen in our clinic, the patient reports doing very well.  She continues to have intermittent episodes of afib.  She has difficulty with knee pain chronically and is planned to have knee surgery soon. Today, she denies symptoms of palpitations, chest pain, shortness of breath,  lower extremity edema, dizziness, presyncope, or syncope.  The patient is otherwise without complaint today.   Past Medical History  Diagnosis Date  . Atrial fibrillation 03/05/2007    a. s/p RFCA 12/18/2011  . 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 GERD 12/23/2009  . Sialoadenitis 03/18/2010  . Hyperlipidemia   . PVC (premature ventricular contraction)   . History of MRSA infection 2008  . Allergic rhinitis   . Cervical disc disease   . Benign fundic gland polyps of stomach   . Hiatal hernia   . Diverticulitis     CT Scan   . Hemorrhoids   . Atrial flutter     a. s/p RFCA 12/18/2011   Past Surgical History  Procedure Date  . Knee surgery     x 9 Left Knee  . Cholecystectomy, laparoscopic 2008  . Upper gastrointestinal endoscopy 01/30/2010    hiatal hernia, fundic gland polyps  . Shoulder surgery     Right   . Elbow surgery     Right  . Foot surgery     Right   . Cervical fusion   . Cesarean section     x 2  . Wrist surgery     Left   . Cholecystectomy   . Flexible sigmoidoscopy     1990's  . Colonoscopy 07/16/2011    diverticulosis  . Tee without cardioversion 12/17/2011    Procedure: TRANSESOPHAGEAL ECHOCARDIOGRAM (TEE);  Surgeon: Laurey Morale, MD;  Location: [facility] ENDOSCOPY;  Service: Cardiovascular;  Laterality: N/A;  . Atrial fibrillation and atrial flutter ablation 12/17/11    PVI and CTI ablations by Dr Johney Frame    Current Outpatient Prescriptions  Medication Sig Dispense Refill  . amiodarone (PACERONE) 200 MG  tablet Take 200 mg by mouth daily.      . calcium-vitamin D (OSCAL WITH D) 250-125 MG-UNIT per tablet Take 1 tablet by mouth daily.      Marland Kitchen co-enzyme Q-10 50 MG capsule Take 50 mg by mouth daily.      . diazepam (VALIUM) 5 MG tablet Take 1 tablet by mouth at bedtime as needed. For sleep      . estradiol (VIVELLE-DOT) 0.1 MG/24HR Place 1 patch (0.1 mg total) onto the skin 2 (two) times a week.  24 patch  12  . famotidine-calcium carbonate-magnesium hydroxide (PEPCID COMPLETE) 10-800-165 MG CHEW Chew 1 tablet by mouth daily as needed. Acid reflux      . fish oil-omega-3 fatty acids 1000 MG capsule Take 2 g by mouth daily.      Marland Kitchen levocetirizine (XYZAL) 5 MG tablet Take 1 tablet by mouth daily.      . Magnesium Citrate 100 MG TABS Take 100 mg by mouth daily.      . methocarbamol (ROBAXIN) 750 MG tablet as needed.      . metoprolol succinate (TOPROL-XL) 100 MG 24 hr tablet TAKE 1 TABLET BY MOUTH EVERY DAY  90 tablet  3  . Multiple Vitamin (MULITIVITAMIN WITH MINERALS) TABS Take 1 tablet by mouth daily.      Marland Kitchen  Polyethyl Glycol-Propyl Glycol (SYSTANE FREE OP) Place 1 drop into both eyes as needed.       . progesterone (PROMETRIUM) 100 MG capsule Take 1 capsule (100 mg total) by mouth daily.  100 capsule  5  . Rivaroxaban (XARELTO) 20 MG TABS Take 20 mg by mouth daily.  30 tablet  11  . DISCONTD: flecainide (TAMBOCOR) 100 MG tablet Take 1 tablet (100 mg total) by mouth 2 (two) times daily.  60 tablet  12    Physical Exam: Filed Vitals:   06/10/12 1158  BP: 116/66  Pulse: 51  Height: 5\' 11"  (1.803 m)  Weight: 260 lb (117.935 kg)  SpO2: 98%    GEN- The patient is well appearing, alert and oriented x 3 today.   Head- normocephalic, atraumatic Eyes-  Sclera clear, conjunctiva pink Ears- hearing intact Oropharynx- clear Lungs- Clear to ausculation bilaterally, normal work of breathing Heart- Regular rate and rhythm, no murmurs, rubs or gallops, PMI not laterally displaced GI- soft, NT, ND, +  BS Extremities- no clubbing, cyanosis, or edema     Assessment and Plan:  Atrial fibrillation  Therapeutic strategies for afib including medicine and ablation were discussed in detail with the patient today. Risk, benefits, and alternatives to EP study and radiofrequency ablation for afib were also discussed in detail today. These risks include but are not limited to stroke, bleeding, vascular damage, tamponade, perforation, damage to the esophagus, lungs, and other structures, pulmonary vein stenosis, worsening renal function, and death. The patient understands these risk and wishes to proceed.  She would like to have her knee surgery first which I think is reasonable.  We will therefore proceed with catheter ablation once she has recovered from knee surgery (likely early December).  She will continue amiodarone in the interim. Continue xarelto  (would hold 48 hours prior to knee sugery

## 2012-06-20 ENCOUNTER — Telehealth: Payer: Self-pay | Admitting: Internal Medicine

## 2012-06-20 NOTE — Telephone Encounter (Signed)
New problem   Knee surgery on 11/6. Need to stop Xarelto  Medical clearance fax on  10/29 & 11/1.

## 2012-06-20 NOTE — Telephone Encounter (Signed)
Dr Johney Frame filled out and we faxed

## 2012-07-11 ENCOUNTER — Encounter: Payer: Self-pay | Admitting: Internal Medicine

## 2012-07-11 ENCOUNTER — Encounter: Payer: Self-pay | Admitting: *Deleted

## 2012-07-11 ENCOUNTER — Ambulatory Visit (INDEPENDENT_AMBULATORY_CARE_PROVIDER_SITE_OTHER): Payer: BC Managed Care – PPO | Admitting: Internal Medicine

## 2012-07-11 VITALS — BP 126/80 | HR 74 | Ht 61.32 in | Wt 206.0 lb

## 2012-07-11 DIAGNOSIS — I4891 Unspecified atrial fibrillation: Secondary | ICD-10-CM

## 2012-07-11 NOTE — Patient Instructions (Addendum)

## 2012-07-13 ENCOUNTER — Other Ambulatory Visit: Payer: Self-pay | Admitting: *Deleted

## 2012-07-13 DIAGNOSIS — I4891 Unspecified atrial fibrillation: Secondary | ICD-10-CM

## 2012-07-20 ENCOUNTER — Encounter (HOSPITAL_COMMUNITY): Payer: Self-pay | Admitting: Pharmacy Technician

## 2012-07-22 ENCOUNTER — Other Ambulatory Visit (INDEPENDENT_AMBULATORY_CARE_PROVIDER_SITE_OTHER): Payer: BC Managed Care – PPO

## 2012-07-22 ENCOUNTER — Ambulatory Visit (INDEPENDENT_AMBULATORY_CARE_PROVIDER_SITE_OTHER): Payer: BC Managed Care – PPO | Admitting: Family

## 2012-07-22 ENCOUNTER — Encounter: Payer: Self-pay | Admitting: Family

## 2012-07-22 VITALS — BP 120/80 | HR 82 | Temp 98.0°F

## 2012-07-22 DIAGNOSIS — J209 Acute bronchitis, unspecified: Secondary | ICD-10-CM

## 2012-07-22 DIAGNOSIS — R05 Cough: Secondary | ICD-10-CM

## 2012-07-22 DIAGNOSIS — I4891 Unspecified atrial fibrillation: Secondary | ICD-10-CM

## 2012-07-22 LAB — CBC WITH DIFFERENTIAL/PLATELET
Basophils Relative: 0.4 % (ref 0.0–3.0)
Eosinophils Absolute: 0.2 10*3/uL (ref 0.0–0.7)
Eosinophils Relative: 2.4 % (ref 0.0–5.0)
HCT: 38.1 % (ref 36.0–46.0)
Lymphs Abs: 1.9 10*3/uL (ref 0.7–4.0)
MCHC: 33.5 g/dL (ref 30.0–36.0)
MCV: 89.3 fl (ref 78.0–100.0)
Monocytes Absolute: 0.6 10*3/uL (ref 0.1–1.0)
Neutrophils Relative %: 67.9 % (ref 43.0–77.0)
RBC: 4.26 Mil/uL (ref 3.87–5.11)
WBC: 8.4 10*3/uL (ref 4.5–10.5)

## 2012-07-22 LAB — BASIC METABOLIC PANEL
BUN: 16 mg/dL (ref 6–23)
CO2: 29 mEq/L (ref 19–32)
Chloride: 104 mEq/L (ref 96–112)
Creatinine, Ser: 0.9 mg/dL (ref 0.4–1.2)
Potassium: 4.2 mEq/L (ref 3.5–5.1)

## 2012-07-22 MED ORDER — DOXYCYCLINE HYCLATE 100 MG PO TABS: 100.0000 mg | ORAL_TABLET | Freq: Two times a day (BID) | ORAL | Status: DC

## 2012-07-22 NOTE — Progress Notes (Signed)
Subjective:    Patient ID: Destiny Dennis, female    DOB: [birth year], 51 y.o.   MRN: [id number]  HPI 51 year old white female, nonsmoker, patient of Dr. Cato Mulligan is in with complaints of a staph infection under her left arm x2 days. She initially noticed a pimple that has  oriented and become more angry over the last 2 days. Denies any drainage or discharge. She has 2 children at home they're both being treated for staph infections. Patient also recently had surgery for a meniscal tear and is walking on crutches that is irritating the lesion under her left arm.   Review of Systems  Constitutional: Negative.  Negative for fever.  Respiratory: Negative.   Cardiovascular: Negative.   Skin: Positive for wound.       Small red lesion under the right armpit  Hematological: Negative.   Psychiatric/Behavioral: Negative.    Past Medical History  Diagnosis Date  . Atrial fibrillation 03/05/2007    a. s/p RFCA 12/18/2011  . 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 GERD 12/23/2009  . Sialoadenitis 03/18/2010  . Hyperlipidemia   . PVC (premature ventricular contraction)   . History of MRSA infection 2008  . Allergic rhinitis   . Cervical disc disease   . Benign fundic gland polyps of stomach   . Hiatal hernia   . Diverticulitis     CT Scan   . Hemorrhoids   . Atrial flutter     a. s/p RFCA 12/18/2011    History   Social History  . Marital Status: Married    Spouse Name: N/A    Number of Children: 2  . Years of Education: N/A   Occupational History  . [employer]     Workers Comp   Social History Main Topics  . Smoking status: Never Smoker   . Smokeless tobacco: Never Used  . Alcohol Use: No  . Drug Use: No  . Sexually Active: Not on file     Comment: has periods q 3-4 months- no possiblity pregnant per pt.07-16-11   Other Topics Concern  . Not on file   Social History Narrative   [employer] Pt lives in [place] with spouse.  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    Past Surgical History  Procedure Date  . Knee surgery     x 9 Left Knee  . Cholecystectomy, laparoscopic 2008  . Upper gastrointestinal endoscopy 01/30/2010    hiatal hernia, fundic gland polyps  . Shoulder surgery     Right   . Elbow surgery     Right  . Foot surgery     Right   . Cervical fusion   . Cesarean section     x 2  . Wrist surgery     Left   . Cholecystectomy   . Flexible sigmoidoscopy     1990's  . Colonoscopy 07/16/2011    diverticulosis  . Tee without cardioversion 12/17/2011    Procedure: TRANSESOPHAGEAL ECHOCARDIOGRAM (TEE);  Surgeon: Laurey Morale, MD;  Location: [facility] ENDOSCOPY;  Service: Cardiovascular;  Laterality: N/A;  . Atrial fibrillation and atrial flutter ablation 12/17/11    PVI and CTI ablations by Dr Johney Frame    Family History  Problem Relation Age of Onset  . Diabetes Maternal Grandfather   . Colon cancer Neg Hx   . Diverticulitis Brother     x 2  . Diverticulitis Paternal Grandmother     Allergies  Allergen Reactions  . Avelox (Moxifloxacin  Hcl In Nacl) Other (See Comments)    Numbness & tingling Peripheral neuropathy    Current Outpatient Prescriptions on File Prior to Visit  Medication Sig Dispense Refill  . acetaminophen (TYLENOL) 500 MG tablet Take 500 mg by mouth at bedtime as needed. For pain      . CALCIUM-VITAMIN D PO Take 1 tablet by mouth daily.      . Cholecalciferol (VITAMIN D PO) Take 1 tablet by mouth daily.      . Coenzyme Q10 200 MG capsule Take 200 mg by mouth daily.      . diazepam (VALIUM) 5 MG tablet Take 5 mg by mouth at bedtime.      Marland Kitchen estradiol (VIVELLE-DOT) 0.1 MG/24HR Place 1 patch (0.1 mg total) onto the skin 2 (two) times a week.  24 patch  12  . fish oil-omega-3 fatty acids 1000 MG capsule Take 2 g by mouth daily.      Marland Kitchen HYDROcodone-acetaminophen (NORCO/VICODIN) 5-325 MG per tablet Take 1 tablet by mouth every 6 (six) hours as needed. For pain      . metoprolol (TOPROL-XL) 200 MG 24 hr tablet  Take 200 mg by mouth at bedtime.       . Multiple Vitamin (MULITIVITAMIN WITH MINERALS) TABS Take 1 tablet by mouth daily.      Marland Kitchen oxyCODONE-acetaminophen (PERCOCET/ROXICET) 5-325 MG per tablet Take 1 tablet by mouth every 4 (four) hours as needed. For pain      . pantoprazole (PROTONIX) 40 MG tablet Take 40 mg by mouth every morning.      . progesterone (PROMETRIUM) 100 MG capsule Take 100 mg by mouth at bedtime.      . Rivaroxaban (XARELTO) 20 MG TABS Take 20 mg by mouth at bedtime.      . [DISCONTINUED] flecainide (TAMBOCOR) 100 MG tablet Take 1 tablet (100 mg total) by mouth 2 (two) times daily.  60 tablet  12    BP 120/80  Pulse 82  Temp 98 F (36.7 C) (Oral)  SpO2 94%  LMP 09/01/2012chart    Objective:   Physical Exam  Constitutional: She is oriented to person, place, and time. She appears well-developed and well-nourished.  Cardiovascular: Normal rate, regular rhythm and normal heart sounds.   Pulmonary/Chest: Effort normal and breath sounds normal.  Neurological: She is alert and oriented to person, place, and time.  Skin: Skin is warm and dry. There is erythema.       Red, papular lesion under the left armpit. No drainage or discharge.   Psychiatric: She has a normal mood and affect. Her behavior is normal.          Assessment & Plan:  Assessment: Folliculitis, likely staph infection  Plan: Doxycycline 100 mg one tablet twice a day x10 days. Patient advised to call the office if symptoms worsen or persist. Recheck a schedule, and when necessary.

## 2012-07-22 NOTE — Patient Instructions (Signed)
MRSA Overview MRSA stands for methicillin-resistant Staphylococcus aureus. It is a type of bacteria that is resistant to some common antibiotics. It can cause infections in the skin and many other places in the body. Staphylococcus aureus, often called "staph," is a bacteria that normally lives on the skin or in the nose. Staph on the surface of the skin or in the nose does not cause problems. However, if the staph enters the body through a cut, wound, or break in the skin, an infection can happen. Up until recently, infections with the MRSA type of staph mainly occurred in hospitals and other healthcare settings. There are now increasing problems with MRSA infections in the community as well. Infections with MRSA may be very serious or even life-threatening. Most MRSA infections are acquired in one of two ways:  Healthcare-associated MRSA (HA-MRSA)  This can be acquired by people in any healthcare setting. MRSA can be a big problem for hospitalized people, people in nursing homes, people in rehabilitation facilities, people with weakened immune systems, dialysis patients, and those who have had surgery.  Community-associated MRSA (CA-MRSA)  Community spread of MRSA is becoming more common. It is known to spread in crowded settings, in jails and prisons, and in situations where there is close skin-to-skin contact, such as during sporting events or in locker rooms. MRSA can be spread through shared items, such as children's toys, razors, towels, or sports equipment. CAUSES  All staph, including MRSA, are normally harmless unless they enter the body through a scratch, cut, or wound, such as with surgery. All staph, including MRSA, can be spread from person-to-person by touching contaminated objects or through direct contact. SPECIAL GROUPS MRSA can present problems for special groups of people. Some of these groups include:  Breastfeeding women.  The most common problem is MRSA infection of the  breast (mastitis). There is evidence that MRSA can be passed to an infant from infected breast milk. Your caregiver may recommend that you stop breastfeeding until the mastitis is under control.  If you are breastfeeding and have a MRSA infection in a place other than the breast, you may usually continue breastfeeding while under treatment. If taking antibiotics, ask your caregiver if it is safe to continue breastfeeding while taking your prescribed medicines.  Neonates (babies from birth to 51 month old) and infants (babies from 1 month to 24 year old).  There is evidence that MRSA can be passed to a newborn at birth if the mother has MRSA on the skin, in or around the birth canal, or an infection in the uterus, cervix, or vagina. MRSA infection can have the same appearance as a normal newborn or infant rash or several other skin infections. This can make it hard to diagnose MRSA.  Immune compromised people.  If you have an immune system problem, you may have a higher chance of developing a MRSA infection.  People after any type of surgery.  Staph in general, including MRSA, is the most common cause of infections occurring at the site of recent surgery.  People on long-term steroid medicines.  These kinds of medicines can lower your resistance to infection. This can increase your chance of getting MRSA.  People who have had frequent hospitalizations, live in nursing homes or other residential care facilities, have venous or urinary catheters, or have taken multiple courses of antibiotic therapy for any reason. DIAGNOSIS  Diagnosis of MRSA is done by cultures of fluid samples that may come from:  Swabs taken from cuts or  wounds in infected areas.  Nasal swabs.  Saliva or deep cough specimens from the lungs (sputum).  Urine.  Blood. Many people are "colonized" with MRSA but have no signs of infection. This means that people carry the MRSA germ on their skin or in their nose and may  never develop MRSA infection.  TREATMENT  Treatment varies and is based on how serious, how deep, or how extensive the infection is. For example:  Some skin infections, such as a small boil or abscess, may be treated by draining yellowish-white fluid (pus) from the site of the infection.  Deeper or more widespread soft tissue infections are usually treated with surgery to drain pus and with antibiotic medicine given by vein or by mouth. This may be recommended even if you are pregnant.  Serious infections may require a hospital stay. If antibiotics are given, they may be needed for several weeks. PREVENTION  Because many people are colonized with staph, including MRSA, preventing the spread of the bacteria from person-to-person is most important. The best way to prevent the spread of bacteria and other germs is through proper hand washing or by using alcohol-based hand disinfectants. The following are other ways to help prevent MRSA infection within the hospital and community settings.   Healthcare settings:  Strict hand washing or hand disinfection procedures need to be followed before and after touching every patient.  Patients infected with MRSA are placed in isolation to prevent the spread of the bacteria.  Healthcare workers need to wear disposable gowns and gloves when touching or caring for patients infected with MRSA. Visitors may also be asked to wear a gown and gloves.  Hospital surfaces need to be disinfected frequently.  Community settings:  [employer] frequently with soap and water for at least 15 seconds. Otherwise, use alcohol-based hand disinfectants when soap and water is not available.  Make sure people who live with you wash their hands often, too.  Do not share personal items. For example, avoid sharing razors and other personal hygiene items, towels, clothing, and athletic equipment.  Wash and dry your clothes and bedding at the warmest temperatures  recommended on the labels.  Keep wounds covered. Pus from infected sores may contain MRSA and other bacteria. Keep cuts and abrasions clean and covered with germ-free (sterile), dry bandages until they are healed.  If you have a wound that appears infected, ask your caregiver if a culture for MRSA and other bacteria should be done.  If you are breastfeeding, talk to your caregiver about MRSA. You may be asked to temporarily stop breastfeeding. HOME CARE INSTRUCTIONS   Take your antibiotics as directed. Finish them even if you start to feel better.  Avoid close contact with those around you as much as possible. Do not use towels, razors, toothbrushes, bedding, or other items that will be used by others.  To fight the infection, follow your caregiver's instructions for wound care. Wash your hands before and after changing your bandages.  If you have an intravascular device, such as a catheter, make sure you know how to care for it.  Be sure to tell any healthcare providers that you have MRSA so they are aware of your infection. SEEK IMMEDIATE MEDICAL CARE IF:   The infection appears to be getting worse. Signs include:  Increased warmth, redness, or tenderness around the wound site.  A red line that extends from the infection site.  A dark color in the area around the infection.  Wound drainage  so they are aware of your infection.  SEEK IMMEDIATE MEDICAL CARE IF:    The infection appears to be getting worse. Signs include:   Increased warmth, redness, or tenderness around the wound site.   A red line that extends from the infection site.   A dark color in the area around the infection.   Wound drainage that is tan, yellow, or green.   A bad smell coming from the wound.   You feel sick to your stomach (nauseous) and throw up (vomit) or cannot keep medicine down.   You have a fever.   Your baby is older than 3 months with a rectal temperature of 102 F (38.9 C) or higher.   Your baby is 3 months old or younger with a rectal temperature of 100.4 F (38 C) or higher.   You have difficulty breathing.  MAKE SURE YOU:    Understand these instructions.   Will watch your condition.   Will get help right away if you are not doing well or get worse.  Document Released: 08/03/2005 Document Revised:  10/26/2011 Document Reviewed: 11/05/2010  [employer] Patient Information 2013 [employer].

## 2012-07-25 ENCOUNTER — Other Ambulatory Visit (HOSPITAL_COMMUNITY): Payer: Self-pay | Admitting: Nurse Practitioner

## 2012-07-25 NOTE — Progress Notes (Signed)
PCP: SWORDS,BRUCE HENRY, MD Primary Cardiologist:  Dr Crenshaw  Destiny Dennis is a 51 y.o. female who presents today for routine electrophysiology followup.  Since last being seen in our clinic, the patient reports doing well.  She continues to have intermittent episodes of afib despite amiodarone therapy.  She is recovering from knee surgery. Today, she denies symptoms of  chest pain, shortness of breath,  lower extremity edema, dizziness, presyncope, or syncope.  The patient is otherwise without complaint today.   Past Medical History  Diagnosis Date  . Atrial fibrillation 03/05/2007    a. s/p RFCA 12/18/2011  . 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 GERD 12/23/2009  . Sialoadenitis 03/18/2010  . Hyperlipidemia   . PVC (premature ventricular contraction)   . History of MRSA infection 2008  . Allergic rhinitis   . Cervical disc disease   . Benign fundic gland polyps of stomach   . Hiatal hernia   . Diverticulitis     CT Scan   . Hemorrhoids   . Atrial flutter     a. s/p RFCA 12/18/2011   Past Surgical History  Procedure Date  . Knee surgery     x 9 Left Knee  . Cholecystectomy, laparoscopic 2008  . Upper gastrointestinal endoscopy 01/30/2010    hiatal hernia, fundic gland polyps  . Shoulder surgery     Right   . Elbow surgery     Right  . Foot surgery     Right   . Cervical fusion   . Cesarean section     x 2  . Wrist surgery     Left   . Cholecystectomy   . Flexible sigmoidoscopy     1990's  . Colonoscopy 07/16/2011    diverticulosis  . Tee without cardioversion 12/17/2011    Procedure: TRANSESOPHAGEAL ECHOCARDIOGRAM (TEE);  Surgeon: Dalton S McLean, MD;  Location: [facility] ENDOSCOPY;  Service: Cardiovascular;  Laterality: N/A;  . Atrial fibrillation and atrial flutter ablation 12/17/11    PVI and CTI ablations by Dr Tayelor Osborne    Current Outpatient Prescriptions  Medication Sig Dispense Refill  . estradiol (VIVELLE-DOT) 0.1 MG/24HR Place 1 patch (0.1 mg total) onto the  skin 2 (two) times a week.  24 patch  12  . Multiple Vitamin (MULITIVITAMIN WITH MINERALS) TABS Take 1 tablet by mouth daily.      . acetaminophen (TYLENOL) 500 MG tablet Take 500 mg by mouth at bedtime as needed. For pain      . CALCIUM-VITAMIN D PO Take 1 tablet by mouth daily.      . Cholecalciferol (VITAMIN D PO) Take 1 tablet by mouth daily.      . Coenzyme Q10 200 MG capsule Take 200 mg by mouth daily.      . diazepam (VALIUM) 5 MG tablet Take 5 mg by mouth at bedtime.      . doxycycline (VIBRA-TABS) 100 MG tablet Take 1 tablet (100 mg total) by mouth 2 (two) times daily.  20 tablet  0  . doxycycline (VIBRA-TABS) 100 MG tablet Take 100 mg by mouth 2 (two) times daily.      . fish oil-omega-3 fatty acids 1000 MG capsule Take 2 g by mouth daily.      . HYDROcodone-acetaminophen (NORCO/VICODIN) 5-325 MG per tablet Take 1 tablet by mouth every 6 (six) hours as needed. For pain      . metoprolol (TOPROL-XL) 200 MG 24 hr tablet Take 200 mg by mouth at bedtime.       .   oxyCODONE-acetaminophen (PERCOCET/ROXICET) 5-325 MG per tablet Take 1 tablet by mouth every 4 (four) hours as needed. For pain      . pantoprazole (PROTONIX) 40 MG tablet Take 40 mg by mouth every morning.      . progesterone (PROMETRIUM) 100 MG capsule Take 100 mg by mouth at bedtime.      . Rivaroxaban (XARELTO) 20 MG TABS Take 20 mg by mouth at bedtime.      . [DISCONTINUED] flecainide (TAMBOCOR) 100 MG tablet Take 1 tablet (100 mg total) by mouth 2 (two) times daily.  60 tablet  12    Physical Exam: Filed Vitals:   07/11/12 1133  BP: 126/80  Pulse: 74  Height: 5' 1.32" (1.558 m)  Weight: 206 lb (93.441 kg)  SpO2: 97%    GEN- The patient is well appearing, alert and oriented x 3 today.   Head- normocephalic, atraumatic Eyes-  Sclera clear, conjunctiva pink Ears- hearing intact Oropharynx- clear Lungs- Clear to ausculation bilaterally, normal work of breathing Heart- Regular rate and rhythm, no murmurs, rubs or  gallops, PMI not laterally displaced GI- soft, NT, ND, + BS Extremities- no clubbing, cyanosis, or edema   ekg today reveals sinus rhythm 74 bpm, otherwise normal ekg  Assessment and Plan:  Atrial fibrillation  The patient has symptomatic recurrent atrial fibrillation s/p ablation.  Therapeutic strategies for afib including medicine and repeat ablation were discussed in detail with the patient today. Risk, benefits, and alternatives to EP study and radiofrequency ablation for afib were also discussed in detail today. These risks include but are not limited to stroke, bleeding, vascular damage, tamponade, perforation, damage to the esophagus, lungs, and other structures, pulmonary vein stenosis, worsening renal function, and death. The patient understands these risk and wishes to proceed.  We will therefore proceed with catheter ablation at the next available time.   

## 2012-07-26 NOTE — Telephone Encounter (Signed)
Fax Received. Refill Completed. Destiny Dennis (R.M.A)   

## 2012-07-28 ENCOUNTER — Encounter (HOSPITAL_COMMUNITY): Payer: Self-pay

## 2012-07-28 ENCOUNTER — Ambulatory Visit (HOSPITAL_COMMUNITY)
Admission: RE | Admit: 2012-07-28 | Discharge: 2012-07-28 | Disposition: A | Discharge status: Home / Self Care | Payer: BC Managed Care – PPO | Source: Ambulatory Visit | Attending: Cardiology | Admitting: Cardiology

## 2012-07-28 ENCOUNTER — Encounter (HOSPITAL_COMMUNITY)
Admission: RE | Disposition: A | Discharge status: Home / Self Care | Payer: Self-pay | Source: Ambulatory Visit | Attending: Cardiology

## 2012-07-28 DIAGNOSIS — Q2111 Secundum atrial septal defect: Secondary | ICD-10-CM | POA: Insufficient documentation

## 2012-07-28 DIAGNOSIS — I4891 Unspecified atrial fibrillation: Secondary | ICD-10-CM

## 2012-07-28 DIAGNOSIS — Q211 Atrial septal defect: Secondary | ICD-10-CM | POA: Insufficient documentation

## 2012-07-28 HISTORY — PX: TEE WITHOUT CARDIOVERSION: SHX5443

## 2012-07-28 SURGERY — ECHOCARDIOGRAM, TRANSESOPHAGEAL
Anesthesia: Moderate Sedation

## 2012-07-28 MED ORDER — FENTANYL CITRATE 0.05 MG/ML IJ SOLN
INTRAMUSCULAR | Status: AC
  Filled 2012-07-28: qty 2

## 2012-07-28 MED ORDER — FENTANYL CITRATE 0.05 MG/ML IJ SOLN
INTRAMUSCULAR | Status: DC | PRN
  Administered 2012-07-28 (×2): 25 ug via INTRAVENOUS
  Administered 2012-07-28 (×2): 50 ug via INTRAVENOUS

## 2012-07-28 MED ORDER — MIDAZOLAM HCL 10 MG/2ML IJ SOLN
INTRAMUSCULAR | Status: DC | PRN
  Administered 2012-07-28: 3 mg via INTRAVENOUS
  Administered 2012-07-28 (×2): 2 mg via INTRAVENOUS
  Administered 2012-07-28: 1 mg via INTRAVENOUS
  Administered 2012-07-28: 2 mg via INTRAVENOUS

## 2012-07-28 MED ORDER — BUTAMBEN-TETRACAINE-BENZOCAINE 2-2-14 % EX AERO
INHALATION_SPRAY | CUTANEOUS | Status: DC | PRN
  Administered 2012-07-28: 2 via TOPICAL

## 2012-07-28 MED ORDER — SODIUM CHLORIDE 0.9 % IV SOLN
INTRAVENOUS | Status: DC
  Administered 2012-07-28: 15:00:00 via INTRAVENOUS

## 2012-07-28 MED ORDER — MIDAZOLAM HCL 5 MG/ML IJ SOLN
INTRAMUSCULAR | Status: AC
  Filled 2012-07-28: qty 3

## 2012-07-28 NOTE — Interval H&P Note (Signed)
History and Physical Interval Note:  07/28/2012 3:32 PM  Destiny Dennis  has presented today for surgery, with the diagnosis of a-fib  The various methods of treatment have been discussed with the patient and family. After consideration of risks, benefits and other options for treatment, the patient has consented to  Procedure(s) (LRB) with comments: TRANSESOPHAGEAL ECHOCARDIOGRAM (TEE) (N/A) as a surgical intervention .  The patient's history has been reviewed, patient examined, no change in status, stable for surgery.  I have reviewed the patient's chart and labs.  Questions were answered to the patient's satisfaction.     Elyn Aquas.

## 2012-07-28 NOTE — Progress Notes (Signed)
  Echocardiogram Echocardiogram Transesophageal has been performed.  Destiny Dennis 07/28/2012, 4:23 PM

## 2012-07-28 NOTE — H&P (View-Only) (Signed)
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 female who presents today for routine electrophysiology followup.  Since last being seen in our clinic, the patient reports doing well.  She continues to have intermittent episodes of afib despite amiodarone therapy.  She is recovering from knee surgery. Today, she denies symptoms of  chest pain, shortness of breath,  lower extremity edema, dizziness, presyncope, or syncope.  The patient is otherwise without complaint today.   Past Medical History  Diagnosis Date  . Atrial fibrillation 03/05/2007    a. s/p RFCA 12/18/2011  . 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 GERD 12/23/2009  . Sialoadenitis 03/18/2010  . Hyperlipidemia   . PVC (premature ventricular contraction)   . History of MRSA infection 2008  . Allergic rhinitis   . Cervical disc disease   . Benign fundic gland polyps of stomach   . Hiatal hernia   . Diverticulitis     CT Scan   . Hemorrhoids   . Atrial flutter     a. s/p RFCA 12/18/2011   Past Surgical History  Procedure Date  . Knee surgery     x 9 Left Knee  . Cholecystectomy, laparoscopic 2008  . Upper gastrointestinal endoscopy 01/30/2010    hiatal hernia, fundic gland polyps  . Shoulder surgery     Right   . Elbow surgery     Right  . Foot surgery     Right   . Cervical fusion   . Cesarean section     x 2  . Wrist surgery     Left   . Cholecystectomy   . Flexible sigmoidoscopy     1990's  . Colonoscopy 07/16/2011    diverticulosis  . Tee without cardioversion 12/17/2011    Procedure: TRANSESOPHAGEAL ECHOCARDIOGRAM (TEE);  Surgeon: Laurey Morale, MD;  Location: [facility] ENDOSCOPY;  Service: Cardiovascular;  Laterality: N/A;  . Atrial fibrillation and atrial flutter ablation 12/17/11    PVI and CTI ablations by Dr Johney Frame    Current Outpatient Prescriptions  Medication Sig Dispense Refill  . estradiol (VIVELLE-DOT) 0.1 MG/24HR Place 1 patch (0.1 mg total) onto the  skin 2 (two) times a week.  24 patch  12  . Multiple Vitamin (MULITIVITAMIN WITH MINERALS) TABS Take 1 tablet by mouth daily.      Marland Kitchen acetaminophen (TYLENOL) 500 MG tablet Take 500 mg by mouth at bedtime as needed. For pain      . CALCIUM-VITAMIN D PO Take 1 tablet by mouth daily.      . Cholecalciferol (VITAMIN D PO) Take 1 tablet by mouth daily.      . Coenzyme Q10 200 MG capsule Take 200 mg by mouth daily.      . diazepam (VALIUM) 5 MG tablet Take 5 mg by mouth at bedtime.      Marland Kitchen doxycycline (VIBRA-TABS) 100 MG tablet Take 1 tablet (100 mg total) by mouth 2 (two) times daily.  20 tablet  0  . doxycycline (VIBRA-TABS) 100 MG tablet Take 100 mg by mouth 2 (two) times daily.      . fish oil-omega-3 fatty acids 1000 MG capsule Take 2 g by mouth daily.      Marland Kitchen HYDROcodone-acetaminophen (NORCO/VICODIN) 5-325 MG per tablet Take 1 tablet by mouth every 6 (six) hours as needed. For pain      . metoprolol (TOPROL-XL) 200 MG 24 hr tablet Take 200 mg by mouth at bedtime.       Marland Kitchen  oxyCODONE-acetaminophen (PERCOCET/ROXICET) 5-325 MG per tablet Take 1 tablet by mouth every 4 (four) hours as needed. For pain      . pantoprazole (PROTONIX) 40 MG tablet Take 40 mg by mouth every morning.      . progesterone (PROMETRIUM) 100 MG capsule Take 100 mg by mouth at bedtime.      . Rivaroxaban (XARELTO) 20 MG TABS Take 20 mg by mouth at bedtime.      . [DISCONTINUED] flecainide (TAMBOCOR) 100 MG tablet Take 1 tablet (100 mg total) by mouth 2 (two) times daily.  60 tablet  12    Physical Exam: Filed Vitals:   07/11/12 1133  BP: 126/80  Pulse: 74  Height: 5' 1.32" (1.558 m)  Weight: 206 lb (93.441 kg)  SpO2: 97%    GEN- The patient is well appearing, alert and oriented x 3 today.   Head- normocephalic, atraumatic Eyes-  Sclera clear, conjunctiva pink Ears- hearing intact Oropharynx- clear Lungs- Clear to ausculation bilaterally, normal work of breathing Heart- Regular rate and rhythm, no murmurs, rubs or  gallops, PMI not laterally displaced GI- soft, NT, ND, + BS Extremities- no clubbing, cyanosis, or edema   ekg today reveals sinus rhythm 74 bpm, otherwise normal ekg  Assessment and Plan:  Atrial fibrillation  The patient has symptomatic recurrent atrial fibrillation s/p ablation.  Therapeutic strategies for afib including medicine and repeat ablation were discussed in detail with the patient today. Risk, benefits, and alternatives to EP study and radiofrequency ablation for afib were also discussed in detail today. These risks include but are not limited to stroke, bleeding, vascular damage, tamponade, perforation, damage to the esophagus, lungs, and other structures, pulmonary vein stenosis, worsening renal function, and death. The patient understands these risk and wishes to proceed.  We will therefore proceed with catheter ablation at the next available time.

## 2012-07-28 NOTE — CV Procedure (Signed)
    Transesophageal Echocardiogram Note  ALYSS GRANATO [id number] 01/03/1961  Procedure: Transesophageal Echocardiogram Indications: Atrial fibrillation  Procedure Details Consent: Obtained Time Out: Verified patient identification, verified procedure, site/side was marked, verified correct patient position, special equipment/implants available, Radiology Safety Procedures followed,  medications/allergies/relevent history reviewed, required imaging and test results available.  Performed  Medications: Fentanyl: 150 mcg IV Versed: 10 mg  IV  Left Ventrical:  Normal LV function  Mitral Valve: normal MV  Aortic Valve: normal AoV  Tricuspid Valve: normal  Pulmonic Valve: normal  Left Atrium/ Left atrial appendage: normal, no thrombi  Atrial septum: very mobile, probable left to right flow across a small PFO, bubble study not performed  Aorta: normal.    Complications: No apparent complications  OK for A-Fib ablation tomorrow.   Patient did tolerate procedure well.   Vesta Mixer, Montez Hageman., MD, [facility] 07/28/2012, 3:59 PM

## 2012-07-29 ENCOUNTER — Encounter (HOSPITAL_COMMUNITY): Payer: Self-pay | Admitting: *Deleted

## 2012-07-29 ENCOUNTER — Encounter (HOSPITAL_COMMUNITY)
Admission: RE | Disposition: A | Discharge status: Home / Self Care | Payer: Self-pay | Source: Ambulatory Visit | Attending: Internal Medicine

## 2012-07-29 ENCOUNTER — Ambulatory Visit (HOSPITAL_COMMUNITY)
Admission: RE | Admit: 2012-07-29 | Discharge: 2012-07-30 | Disposition: A | Discharge status: Home / Self Care | Payer: BC Managed Care – PPO | Source: Ambulatory Visit | Attending: Internal Medicine | Admitting: Internal Medicine

## 2012-07-29 ENCOUNTER — Ambulatory Visit (HOSPITAL_COMMUNITY): Payer: BC Managed Care – PPO | Admitting: *Deleted

## 2012-07-29 ENCOUNTER — Encounter (HOSPITAL_COMMUNITY): Payer: Self-pay | Admitting: General Practice

## 2012-07-29 DIAGNOSIS — I4891 Unspecified atrial fibrillation: Secondary | ICD-10-CM | POA: Diagnosis present

## 2012-07-29 DIAGNOSIS — K219 Gastro-esophageal reflux disease without esophagitis: Secondary | ICD-10-CM | POA: Insufficient documentation

## 2012-07-29 DIAGNOSIS — E785 Hyperlipidemia, unspecified: Secondary | ICD-10-CM | POA: Insufficient documentation

## 2012-07-29 DIAGNOSIS — Z79899 Other long term (current) drug therapy: Secondary | ICD-10-CM | POA: Insufficient documentation

## 2012-07-29 DIAGNOSIS — Z7901 Long term (current) use of anticoagulants: Secondary | ICD-10-CM | POA: Insufficient documentation

## 2012-07-29 HISTORY — PX: ATRIAL FIBRILLATION ABLATION: SHX5456

## 2012-07-29 HISTORY — DX: Unspecified osteoarthritis, unspecified site: M19.90

## 2012-07-29 LAB — POCT ACTIVATED CLOTTING TIME
Activated Clotting Time: 296 seconds
Activated Clotting Time: 165 seconds

## 2012-07-29 LAB — PROTIME-INR: INR: 1.27 (ref 0.00–1.49)

## 2012-07-29 SURGERY — ATRIAL FIBRILLATION ABLATION
Anesthesia: Monitor Anesthesia Care

## 2012-07-29 MED ORDER — ONDANSETRON HCL 4 MG/2ML IJ SOLN: 4.0000 mg | Freq: Four times a day (QID) | INTRAMUSCULAR | Status: DC | PRN

## 2012-07-29 MED ORDER — PANTOPRAZOLE SODIUM 40 MG PO TBEC
40.0000 mg | DELAYED_RELEASE_TABLET | Freq: Every day | ORAL | Status: DC
  Administered 2012-07-30: 40 mg via ORAL
  Filled 2012-07-29: qty 1

## 2012-07-29 MED ORDER — MIDAZOLAM HCL 5 MG/5ML IJ SOLN
INTRAMUSCULAR | Status: DC | PRN
  Administered 2012-07-29: 1 mg via INTRAVENOUS
  Administered 2012-07-29: 2 mg via INTRAVENOUS
  Administered 2012-07-29: 1 mg via INTRAVENOUS

## 2012-07-29 MED ORDER — ACETAMINOPHEN 325 MG PO TABS: 650.0000 mg | ORAL_TABLET | ORAL | Status: DC | PRN

## 2012-07-29 MED ORDER — HYDROXYUREA 500 MG PO CAPS
ORAL_CAPSULE | ORAL | Status: AC
  Filled 2012-07-29: qty 1

## 2012-07-29 MED ORDER — HEPARIN SODIUM (PORCINE) 1000 UNIT/ML IJ SOLN
INTRAMUSCULAR | Status: AC
  Filled 2012-07-29: qty 1

## 2012-07-29 MED ORDER — OXYCODONE HCL 5 MG/5ML PO SOLN: 5.0000 mg | Freq: Once | ORAL | Status: DC | PRN

## 2012-07-29 MED ORDER — HYDROCODONE-ACETAMINOPHEN 5-325 MG PO TABS
1.0000 | ORAL_TABLET | Freq: Four times a day (QID) | ORAL | Status: DC | PRN
  Administered 2012-07-29: 1 via ORAL
  Filled 2012-07-29: qty 1

## 2012-07-29 MED ORDER — PROTAMINE SULFATE 10 MG/ML IV SOLN
INTRAVENOUS | Status: DC | PRN
  Administered 2012-07-29: 40 mg via INTRAVENOUS

## 2012-07-29 MED ORDER — SODIUM CHLORIDE 0.9 % IJ SOLN: 3.0000 mL | INTRAMUSCULAR | Status: DC | PRN

## 2012-07-29 MED ORDER — BUPIVACAINE HCL (PF) 0.25 % IJ SOLN
INTRAMUSCULAR | Status: AC
  Filled 2012-07-29: qty 60

## 2012-07-29 MED ORDER — DIPHENHYDRAMINE HCL 50 MG/ML IJ SOLN
INTRAMUSCULAR | Status: DC | PRN
  Administered 2012-07-29: 25 mg via INTRAVENOUS

## 2012-07-29 MED ORDER — PROPOFOL INFUSION 10 MG/ML OPTIME
INTRAVENOUS | Status: DC | PRN
  Administered 2012-07-29: 100 ug/kg/min via INTRAVENOUS

## 2012-07-29 MED ORDER — HEPARIN SODIUM (PORCINE) 1000 UNIT/ML IJ SOLN
INTRAMUSCULAR | Status: DC | PRN
  Administered 2012-07-29: 10 mL via INTRAVENOUS
  Administered 2012-07-29: 5 mL via INTRAVENOUS
  Administered 2012-07-29: 2 mL via INTRAVENOUS

## 2012-07-29 MED ORDER — ONDANSETRON HCL 4 MG/2ML IJ SOLN
4.0000 mg | Freq: Once | INTRAMUSCULAR | Status: AC
  Administered 2012-07-29: 4 mg via INTRAVENOUS

## 2012-07-29 MED ORDER — DEXAMETHASONE SODIUM PHOSPHATE 4 MG/ML IJ SOLN
INTRAMUSCULAR | Status: DC | PRN
  Administered 2012-07-29: 4 mg via INTRAVENOUS

## 2012-07-29 MED ORDER — PROMETHAZINE HCL 25 MG/ML IJ SOLN: 6.2500 mg | INTRAMUSCULAR | Status: DC | PRN

## 2012-07-29 MED ORDER — PROGESTERONE MICRONIZED 100 MG PO CAPS
100.0000 mg | ORAL_CAPSULE | Freq: Every day | ORAL | Status: DC
  Administered 2012-07-29: 100 mg via ORAL
  Filled 2012-07-29 (×2): qty 1

## 2012-07-29 MED ORDER — ONDANSETRON HCL 4 MG/2ML IJ SOLN
INTRAMUSCULAR | Status: AC
  Filled 2012-07-29: qty 2

## 2012-07-29 MED ORDER — MEPERIDINE HCL 25 MG/ML IJ SOLN: 6.2500 mg | INTRAMUSCULAR | Status: DC | PRN

## 2012-07-29 MED ORDER — SODIUM CHLORIDE 0.9 % IV SOLN
1000.0000 ug | INTRAVENOUS | Status: DC | PRN
  Administered 2012-07-29: 20 ug/min via INTRAVENOUS

## 2012-07-29 MED ORDER — OXYCODONE HCL 5 MG PO TABS: 5.0000 mg | ORAL_TABLET | Freq: Once | ORAL | Status: DC | PRN

## 2012-07-29 MED ORDER — FENTANYL CITRATE 0.05 MG/ML IJ SOLN
INTRAMUSCULAR | Status: DC | PRN
  Administered 2012-07-29: 25 ug via INTRAVENOUS
  Administered 2012-07-29: 50 ug via INTRAVENOUS
  Administered 2012-07-29 (×2): 25 ug via INTRAVENOUS
  Administered 2012-07-29: 50 ug via INTRAVENOUS
  Administered 2012-07-29 (×9): 25 ug via INTRAVENOUS
  Administered 2012-07-29: 50 ug via INTRAVENOUS

## 2012-07-29 MED ORDER — RIVAROXABAN 20 MG PO TABS
20.0000 mg | ORAL_TABLET | Freq: Every day | ORAL | Status: DC
  Administered 2012-07-29: 20 mg via ORAL
  Filled 2012-07-29 (×2): qty 1

## 2012-07-29 MED ORDER — DOXYCYCLINE HYCLATE 100 MG PO TABS
100.0000 mg | ORAL_TABLET | Freq: Two times a day (BID) | ORAL | Status: DC
  Administered 2012-07-29 – 2012-07-30 (×2): 100 mg via ORAL
  Filled 2012-07-29 (×3): qty 1

## 2012-07-29 MED ORDER — SODIUM CHLORIDE 0.9 % IJ SOLN
3.0000 mL | Freq: Two times a day (BID) | INTRAMUSCULAR | Status: DC
  Administered 2012-07-29 – 2012-07-30 (×2): 3 mL via INTRAVENOUS

## 2012-07-29 MED ORDER — HYDROCODONE-ACETAMINOPHEN 5-325 MG PO TABS
1.0000 | ORAL_TABLET | ORAL | Status: DC | PRN
  Administered 2012-07-29 – 2012-07-30 (×2): 1 via ORAL
  Filled 2012-07-29 (×2): qty 1

## 2012-07-29 MED ORDER — ONDANSETRON HCL 4 MG/2ML IJ SOLN
INTRAMUSCULAR | Status: DC | PRN
  Administered 2012-07-29: 4 mg via INTRAVENOUS

## 2012-07-29 MED ORDER — METOPROLOL SUCCINATE ER 100 MG PO TB24
100.0000 mg | ORAL_TABLET | Freq: Every day | ORAL | Status: DC
  Administered 2012-07-29: 100 mg via ORAL
  Filled 2012-07-29 (×2): qty 1

## 2012-07-29 MED ORDER — INFLUENZA VIRUS VACC SPLIT PF IM SUSP
0.5000 mL | INTRAMUSCULAR | Status: AC
  Administered 2012-07-30: 0.5 mL via INTRAMUSCULAR
  Filled 2012-07-29: qty 0.5

## 2012-07-29 MED ORDER — DIAZEPAM 5 MG PO TABS
5.0000 mg | ORAL_TABLET | Freq: Every day | ORAL | Status: DC
  Administered 2012-07-29: 5 mg via ORAL
  Filled 2012-07-29: qty 1

## 2012-07-29 MED ORDER — SODIUM CHLORIDE 0.9 % IV SOLN: 250.0000 mL | INTRAVENOUS | Status: DC | PRN

## 2012-07-29 MED ORDER — FENTANYL CITRATE 0.05 MG/ML IJ SOLN: 25.0000 ug | INTRAMUSCULAR | Status: DC | PRN

## 2012-07-29 MED ORDER — MIDAZOLAM HCL 2 MG/2ML IJ SOLN: 0.5000 mg | Freq: Once | INTRAMUSCULAR | Status: DC | PRN

## 2012-07-29 MED ORDER — LACTATED RINGERS IV SOLN
INTRAVENOUS | Status: DC | PRN
  Administered 2012-07-29 (×2): via INTRAVENOUS

## 2012-07-29 NOTE — Preoperative (Signed)
Beta Blockers   Reason not to administer Beta Blockers:Not Applicable, took Toprol at 7pm last night

## 2012-07-29 NOTE — H&P (View-Only) (Signed)
PCP: SWORDS,BRUCE HENRY, MD Primary Cardiologist:  Dr Crenshaw  Destiny Dennis is a 51 y.o. female who presents today for routine electrophysiology followup.  Since last being seen in our clinic, the patient reports doing well.  She continues to have intermittent episodes of afib despite amiodarone therapy.  She is recovering from knee surgery. Today, she denies symptoms of  chest pain, shortness of breath,  lower extremity edema, dizziness, presyncope, or syncope.  The patient is otherwise without complaint today.   Past Medical History  Diagnosis Date  . Atrial fibrillation 03/05/2007    a. s/p RFCA 12/18/2011  . 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 GERD 12/23/2009  . Sialoadenitis 03/18/2010  . Hyperlipidemia   . PVC (premature ventricular contraction)   . History of MRSA infection 2008  . Allergic rhinitis   . Cervical disc disease   . Benign fundic gland polyps of stomach   . Hiatal hernia   . Diverticulitis     CT Scan   . Hemorrhoids   . Atrial flutter     a. s/p RFCA 12/18/2011   Past Surgical History  Procedure Date  . Knee surgery     x 9 Left Knee  . Cholecystectomy, laparoscopic 2008  . Upper gastrointestinal endoscopy 01/30/2010    hiatal hernia, fundic gland polyps  . Shoulder surgery     Right   . Elbow surgery     Right  . Foot surgery     Right   . Cervical fusion   . Cesarean section     x 2  . Wrist surgery     Left   . Cholecystectomy   . Flexible sigmoidoscopy     1990's  . Colonoscopy 07/16/2011    diverticulosis  . Tee without cardioversion 12/17/2011    Procedure: TRANSESOPHAGEAL ECHOCARDIOGRAM (TEE);  Surgeon: Dalton S McLean, MD;  Location: [facility] ENDOSCOPY;  Service: Cardiovascular;  Laterality: N/A;  . Atrial fibrillation and atrial flutter ablation 12/17/11    PVI and CTI ablations by Dr Kamylle Axelson    Current Outpatient Prescriptions  Medication Sig Dispense Refill  . estradiol (VIVELLE-DOT) 0.1 MG/24HR Place 1 patch (0.1 mg total) onto the  skin 2 (two) times a week.  24 patch  12  . Multiple Vitamin (MULITIVITAMIN WITH MINERALS) TABS Take 1 tablet by mouth daily.      . acetaminophen (TYLENOL) 500 MG tablet Take 500 mg by mouth at bedtime as needed. For pain      . CALCIUM-VITAMIN D PO Take 1 tablet by mouth daily.      . Cholecalciferol (VITAMIN D PO) Take 1 tablet by mouth daily.      . Coenzyme Q10 200 MG capsule Take 200 mg by mouth daily.      . diazepam (VALIUM) 5 MG tablet Take 5 mg by mouth at bedtime.      . doxycycline (VIBRA-TABS) 100 MG tablet Take 1 tablet (100 mg total) by mouth 2 (two) times daily.  20 tablet  0  . doxycycline (VIBRA-TABS) 100 MG tablet Take 100 mg by mouth 2 (two) times daily.      . fish oil-omega-3 fatty acids 1000 MG capsule Take 2 g by mouth daily.      . HYDROcodone-acetaminophen (NORCO/VICODIN) 5-325 MG per tablet Take 1 tablet by mouth every 6 (six) hours as needed. For pain      . metoprolol (TOPROL-XL) 200 MG 24 hr tablet Take 200 mg by mouth at bedtime.       .   oxyCODONE-acetaminophen (PERCOCET/ROXICET) 5-325 MG per tablet Take 1 tablet by mouth every 4 (four) hours as needed. For pain      . pantoprazole (PROTONIX) 40 MG tablet Take 40 mg by mouth every morning.      . progesterone (PROMETRIUM) 100 MG capsule Take 100 mg by mouth at bedtime.      . Rivaroxaban (XARELTO) 20 MG TABS Take 20 mg by mouth at bedtime.      . [DISCONTINUED] flecainide (TAMBOCOR) 100 MG tablet Take 1 tablet (100 mg total) by mouth 2 (two) times daily.  60 tablet  12    Physical Exam: Filed Vitals:   07/11/12 1133  BP: 126/80  Pulse: 74  Height: 5' 1.32" (1.558 m)  Weight: 206 lb (93.441 kg)  SpO2: 97%    GEN- The patient is well appearing, alert and oriented x 3 today.   Head- normocephalic, atraumatic Eyes-  Sclera clear, conjunctiva pink Ears- hearing intact Oropharynx- clear Lungs- Clear to ausculation bilaterally, normal work of breathing Heart- Regular rate and rhythm, no murmurs, rubs or  gallops, PMI not laterally displaced GI- soft, NT, ND, + BS Extremities- no clubbing, cyanosis, or edema   ekg today reveals sinus rhythm 74 bpm, otherwise normal ekg  Assessment and Plan:  Atrial fibrillation  The patient has symptomatic recurrent atrial fibrillation s/p ablation.  Therapeutic strategies for afib including medicine and repeat ablation were discussed in detail with the patient today. Risk, benefits, and alternatives to EP study and radiofrequency ablation for afib were also discussed in detail today. These risks include but are not limited to stroke, bleeding, vascular damage, tamponade, perforation, damage to the esophagus, lungs, and other structures, pulmonary vein stenosis, worsening renal function, and death. The patient understands these risk and wishes to proceed.  We will therefore proceed with catheter ablation at the next available time.   

## 2012-07-29 NOTE — Interval H&P Note (Signed)
History and Physical Interval Note:  07/29/2012 7:21 AM  Destiny Dennis  has presented today for surgery, with the diagnosis of AFib  The various methods of treatment have been discussed with the patient and family. After consideration of risks, benefits and other options for treatment, the patient has consented to  Procedure(s) (LRB) with comments: ATRIAL FIBRILLATION ABLATION (N/A) as a surgical intervention .  The patient's history has been reviewed, patient examined, no change in status, stable for surgery.  I have reviewed the patient's chart and labs.  Questions were answered to the patient's satisfaction.     Hillis Range

## 2012-07-29 NOTE — Op Note (Signed)
SURGEON:  Hillis Range, MD  PREPROCEDURE DIAGNOSES: 1. Paroxysmal atrial fibrillation.  POSTPROCEDURE DIAGNOSES: 1. Paroxysmal  atrial fibrillation.  PROCEDURES: 1. Comprehensive electrophysiologic study. 2. Coronary sinus pacing and recording. 3. Three-dimensional mapping of atrial fibrillation with additional mapping and ablation of a second discrete focus 4. Ablation of atrial fibrillation with additional mapping and ablation of a second discrete focus 5. Intracardiac echocardiography. 6. Transseptal puncture of an intact septum. 7. Rotational Angiography with processing at an independent workstation 8. Arrhythmia induction with pacing with isuprel infusion 9. External cardioversion  INTRODUCTION:  Destiny Dennis is a 51 y.o. female with a history of paroxysmal atrial fibrillation who now presents for repeat EP study and radiofrequency ablation.  The patient reports initially being diagnosed with atrial fibrillation after presenting with symptomatic palpitations and fatgiue. The patient reports increasing frequency and duration of atrial arrhythmias since that time. The patient has failed medical therapy with flecainide and amiodarone.   The patient therefore presents today for catheter ablation of atrial fibrillation.  DESCRIPTION OF PROCEDURE:  Informed written consent was obtained, and the patient was brought to the electrophysiology lab in a fasting state.  The patient was adequately sedated with intravenous medications as outlined in the anesthesia report.  The patient's left and right groins were prepped and draped in the usual sterile fashion by the EP lab staff.  Using a percutaneous Seldinger technique, two 7-French and one 11-French hemostasis sheaths were placed into the right common femoral vein.    3 Dimensional Rotational Angiography: A 5 french pigtail catheter was introduced through the right common femoral vein and advanced into the inferior venocava.  3 demential  rotational angiography was then performed by power injection of 100cc of nonionic contrast.  Reprocessing at an independent work station was then performed.   This demonstrated a large sized left atrium with 4 separate pulmonary veins which were also moderate in size.  There were no anomalous veins or significant abnormalities.  A 3 dimensional rendering of the left atrium was then merged using [employer] onto the [employer] system and registered with intracardiac echo (see below).  The pigtail catheter was then removed.  Catheter Placement:  A 7-French Biosense Webster Decapolar coronary sinus catheter was introduced through the right common femoral vein and advanced into the coronary sinus for recording and pacing from this location.  A 6-French quadripolar Josephson catheter was introduced through the right common femoral vein and advanced into the right ventricle for recording and pacing.  This catheter was then pulled back to the His bundle location.    Initial Measurements: The patient presented to the electrophysiology lab in sinus rhythm.  Her PR interval measured with a QRS duringation of 98 msec and a QT interval of 431 msec.  The AH interval measured 98 msec and the HV interval measured 39 msec.   Ventricular pacing revealed VA dissociation when pacing at 700 msec.  Intracardiac Echocardiography: A 10-French Biosense Webster AcuNav intracardiac echocardiography catheter was introduced through the left common femoral vein and advanced into the right atrium. Intracardiac echocardiography was performed of the left atrium, and a three-dimensional anatomical rendering of the left atrium was performed using CARTO sound technology.  The patient was noted to have a large sized left atrium.  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 All 4 pulmonary veins were visualized and noted to have separate ostia.  The pulmonary veins were moderate in size.  The left  atrial appendage was  visualized and did not reveal thrombus.   There was no evidence of pulmonary vein stenosis.   Transseptal Puncture: The middle right common femoral vein sheath was exchanged for an 8.5 [place] SL2 transseptal sheath and transseptal access was achieved in a standard fashion using a Brockenbrough needle under biplane fluoroscopy with intracardiac echocardiography confirmation of the transseptal puncture.  Once transseptal access had been achieved, heparin was administered intravenously and intra- arterially in order to maintain an ACT of greater than 300 seconds throughout the procedure.   3D Mapping and Ablation: The His bundle catheter was removed and in its place a 3.5 mm Biosense Webster E[employer] ablation catheter was advanced into the right atrium.  The transseptal sheath was pulled back into the IVC over a guidewire.  The ablation catheter was advanced across the transseptal hole using the wire as a guide.  The transseptal sheath was then re-advanced over the guidewire into the left atrium.  A duodecapolar Biosense Webster circular mapping catheter was introduced through the transseptal sheath and positioned over the mouth of all 4 pulmonary veins.  Three-dimensional electroanatomical mapping was performed using CARTO technology.  This demonstrated return of electrical activity within the left superior pulmonary vein at baseline.   There was also trivial conduction along the carina between the left superior and inferior pulmonary veins. The right superior and inferior PVs were electrically silent from the prior ablation.  The patient underwent successful electrical re-isolation and anatomical encircling of the left sided pulmonary veins using radiofrequency current with a circular mapping catheter as a guide.   Additional mapping and ablation of a second focus: Isuprel was then infused at 20 mcg/min.  The patient had firing from an area near the right superior pulmonary  vein which degenerated into atrial fibrillation.  Ablation was performed in this location.  Additional mapping and ablation of CAFE's were ablated along the roof of the left atrium and the mid posteral wall of the left atrium.  Cardioversion: The patient was then cardioverted to sinus rhythm with a single synchronized 360-J biphasic shock with cardioversion electrodes in the anterior-posterior thoracic configuration.  She remained in sinus rhythm thereafter.  SVC Ablation: The circular mapping catheter was pulled back into the right atrium and 3D mapping was performed at the junction of the superior vena cava and right atrium.  Electrical activity was observed within the SVC.  I therefore elected to perform right atrial ablation in this area.  A series of radiofrequency applications were delivered in a circular fashion around the ostium of the SVC.  Prior to each ablation lesions, pacing was performed from the distal ablation electrode to insure that diaphragmatic stimulation was not observed to avoid phrenic nerve injury.  Diaphragmatic excursion was also observed during ablation.    Measurements Following Ablation: In sinus rhythm with RR interval was 860 msec, with PR , QRS 104 msec, and Qtc 499 msec.  Following ablation the AH interval measured with an HV interval of 39 msec. Rapid atrial pacing was performed, which revealed an AV Wenckebach cycle length of 350 msec. Pacing was continued down to a cycle length of with no sustained arrhythmias induced.  The procedure was therefore considered completed.  All catheters were removed, and the sheaths were aspirated and flushed.  The patient was transferred to the recovery area for sheath removal per protocol.  A limited bedside transthoracic echocardiogram revealed no pericardial effusion.  There were no early apparent complications.  CONCLUSIONS: 1. Sinus rhythm upon presentation.  2. Rotational Angiography reveals a large  sized left atrium with four separate pulmonary veins without evidence of pulmonary vein stenosis. 3. Return of electrical activity within the left superior pulmonary vein.  There was also trivial conduction along the carina between the left superior and inferior pulmonary veins. The right superior and inferior PVs were electrically silent from the prior ablation.  The patient underwent successful electrical re-isolation and anatomical encircling of the left sided pulmonary veins using radiofrequency current. 4. An additional focus near the right superior pulmonary veins was ablated.  CAFEs were also ablated along the roof and posterior wall of the LA.  The SVC was isolated today with RF. 5. No early apparent complications.   Dannelle Rhymes,MD 11:13 AM 07/29/2012

## 2012-07-29 NOTE — Anesthesia Postprocedure Evaluation (Signed)
  Anesthesia Post-op Note  Patient: Destiny Dennis  Procedure(s) Performed: Procedure(s) (LRB) with comments: ATRIAL FIBRILLATION ABLATION (N/A)  Patient Location: PACU  Anesthesia Type:MAC  Level of Consciousness: awake, alert , oriented and patient cooperative  Airway and Oxygen Therapy: Patient Spontanous Breathing  Post-op Pain: none  Post-op Assessment: Post-op Vital signs reviewed, Patient's Cardiovascular Status Stable, Respiratory Function Stable, Patent Airway, No signs of Nausea or vomiting and Pain level controlled  Post-op Vital Signs: Reviewed and stable  Complications: No apparent anesthesia complications

## 2012-07-29 NOTE — Anesthesia Preprocedure Evaluation (Addendum)
Anesthesia Evaluation  Patient identified by MRN, date of birth, ID band Patient awake    Reviewed: Allergy & Precautions, H&P , NPO status , Patient's Chart, lab work & pertinent test results  History of Anesthesia Complications Negative for: history of anesthetic complications  Airway Mallampati: II TM Distance: >3 FB Neck ROM: Full    Dental  (+) Teeth Intact and Dental Advisory Given   Pulmonary neg pulmonary ROS,  breath sounds clear to auscultation  Pulmonary exam normal       Cardiovascular + dysrhythmias (TEE yesterday: normal LVF, valves OK, ?pinhole VSD) Atrial Fibrillation Rhythm:Irregular Rate:Normal     Neuro/Psych Anxiety negative neurological ROS     GI/Hepatic Neg liver ROS, hiatal hernia, GERD-  Controlled,  Endo/Other  negative endocrine ROS  Renal/GU negative Renal ROS     Musculoskeletal  (+) Arthritis -,   Abdominal Normal abdominal exam  (+) + obese,   Peds  Hematology negative hematology ROS (+)   Anesthesia Other Findings   Reproductive/Obstetrics                          Anesthesia Physical Anesthesia Plan  ASA: III  Anesthesia Plan: MAC   Post-op Pain Management:    Induction: Intravenous  Airway Management Planned: Simple Face Mask and Natural Airway  Additional Equipment:   Intra-op Plan:   Post-operative Plan:   Informed Consent: I have reviewed the patients History and Physical, chart, labs and discussed the procedure including the risks, benefits and alternatives for the proposed anesthesia with the patient or authorized representative who has indicated his/her understanding and acceptance.   Dental advisory given  Plan Discussed with: Anesthesiologist, Surgeon and CRNA  Anesthesia Plan Comments: (Plan routine monitors, MAC)       Anesthesia Quick Evaluation

## 2012-07-29 NOTE — Transfer of Care (Signed)
Immediate Anesthesia Transfer of Care Note  Patient: Destiny Dennis  Procedure(s) Performed: Procedure(s) (LRB) with comments: ATRIAL FIBRILLATION ABLATION (N/A)  Patient Location: PACU and Cath Lab  Anesthesia Type:MAC  Level of Consciousness: awake, oriented and patient cooperative  Airway & Oxygen Therapy: Patient Spontanous Breathing  Post-op Assessment: Report given to PACU RN and Post -op Vital signs reviewed and stable, pt c/o nausea--"took Doxycycline this AM on empty stomach"  Post vital signs: Reviewed and stable  Complications: No apparent anesthesia complications

## 2012-07-30 DIAGNOSIS — I4891 Unspecified atrial fibrillation: Secondary | ICD-10-CM

## 2012-07-30 LAB — BASIC METABOLIC PANEL
BUN: 12 mg/dL (ref 6–23)
Chloride: 103 mEq/L (ref 96–112)
Creatinine, Ser: 0.74 mg/dL (ref 0.50–1.10)
GFR calc Af Amer: >90 mL/min (ref >90)
Glucose, Bld: 105 mg/dL — ABNORMAL HIGH (ref 70–99)

## 2012-07-30 MED ORDER — HYDROCODONE-ACETAMINOPHEN 5-325 MG PO TABS: 1.0000 | ORAL_TABLET | Freq: Four times a day (QID) | ORAL | Status: DC | PRN

## 2012-07-30 NOTE — Progress Notes (Signed)
   Subjective:  Doing well. Remains in NSR, Mild chest pain relieved by hydrocodone  Objective:  Vital Signs in the last 24 hours: Temp:  [97.4 F (36.3 C)-98.5 F (36.9 C)] 98.5 F (36.9 C) (12/14 0700) Pulse Rate:  [60-75] 62  (12/14 0700) Resp:  [12-23] 12  (12/14 0345) BP: (100-136)/(49-76) 100/49 mmHg (12/14 0700) SpO2:  [91 %-98 %] 95 % (12/14 0700)  Intake/Output from previous day: 12/13 0701 - 12/14 0700 In: 1440 [P.O.:240; I.V.:1200] Out: 500 [Urine:500] Intake/Output from this shift:       . diazepam  5 mg Oral QHS  . doxycycline  100 mg Oral BID  . influenza  inactive virus vaccine  0.5 mL Intramuscular Tomorrow-1000  . metoprolol  100 mg Oral QHS  . pantoprazole  40 mg Oral Daily  . progesterone  100 mg Oral QHS  . Rivaroxaban  20 mg Oral Q supper  . sodium chloride  3 mL Intravenous Q12H      Physical Exam: The patient appears to be in no distress.  Head and neck exam reveals that the pupils are equal and reactive.  The extraocular movements are full.  There is no scleral icterus.  Mouth and pharynx are benign.  No lymphadenopathy.  No carotid bruits.  The jugular venous pressure is normal.  Thyroid is not enlarged or tender.  Chest is clear to percussion and auscultation.  No rales or rhonchi.  Expansion of the chest is symmetrical.  Heart reveals no abnormal lift or heave.  First and second heart sounds are normal.  There is no murmur gallop rub or click.  The abdomen is soft and nontender.  Bowel sounds are normoactive.  There is no hepatosplenomegaly or mass.  There are no abdominal bruits. Groin okay Extremities reveal no phlebitis or edema.  Pedal pulses are good.  There is no cyanosis or clubbing.  Neurologic exam is normal strength and no lateralizing weakness.  No sensory deficits.  Integument reveals no rash  Lab Results: No results found for this basename: WBC:2,HGB:2,PLT:2 in the last 72 hours  Basename 07/30/12 0614  NA 139  K 3.7    CL 103  CO2 25  GLUCOSE 105*  BUN 12  CREATININE 0.74   No results found for this basename: TROPONINI:2,CK,MB:2 in the last 72 hours Hepatic Function Panel No results found for this basename: PROT,ALBUMIN,AST,ALT,ALKPHOS,BILITOT,BILIDIR,IBILI in the last 72 hours No results found for this basename: CHOL in the last 72 hours No results found for this basename: PROTIME in the last 72 hours  Imaging: Imaging results have been reviewed  Cardiac Studies: Telemetry shows NSR Assessment/Plan:  Patient Active Hospital Problem List: Atrial fibrillation (03/05/2007)   Assessment: Now in NSR following ablation.   Plan: Home today on home meds plus hydrocodone for chest pain. Appt with Dr. Johney Frame in 12 weeks.   LOS: 1 day    Destiny Dennis 07/30/2012, 9:57 AM

## 2012-07-30 NOTE — Discharge Summary (Signed)
Physician Discharge Summary  Patient ID: Destiny Dennis MRN: [id number] DOB/AGE: Feb 23, 1961 51 y.o.  Admit date: 07/29/2012 Discharge date: 07/30/2012  Primary Discharge Diagnosis: Atrial fibrillation   Significant Diagnostic Studies: PROCEDURES:  1. Comprehensive electrophysiologic study.  2. Coronary sinus pacing and recording.  3. Three-dimensional mapping of atrial fibrillation with additional mapping and ablation of a second discrete focus  4. Ablation of atrial fibrillation with additional mapping and ablation of a second discrete focus  5. Intracardiac echocardiography.  6. Transseptal puncture of an intact septum.  7. Rotational Angiography with processing at an independent workstation  8. Arrhythmia induction with pacing with isuprel infusion  9. External cardioversion   Consults: None  Hospital Course: 51 y.o. female with a history of paroxysmal atrial fibrillation who now presents for repeat EP study and radiofrequency ablation. The patient reports initially being diagnosed with atrial fibrillation after presenting with symptomatic palpitations and fatgiue. The patient reports increasing frequency and duration of atrial arrhythmias since that time. The patient has failed medical therapy with flecainide and amiodarone. She was admitted for ablation procedure per Dr. Johney Frame, Please see his report for more details of procedure.   CONCLUSIONS:  1. Sinus rhythm upon presentation.  2. Rotational Angiography reveals a large sized left atrium with four separate pulmonary veins without evidence of pulmonary vein stenosis.  3. Return of electrical activity within the left superior pulmonary vein. There was also trivial conduction along the carina between the left superior and inferior pulmonary veins. The right superior and inferior PVs were electrically silent from the prior ablation. The patient underwent successful electrical re-isolation and anatomical encircling of the left  sided pulmonary veins using radiofrequency current.  4. An additional focus near the right superior pulmonary veins was ablated. CAFEs were also ablated along the roof and posterior wall of the LA. The SVC was isolated today with RF.  5. No early apparent complications.  The patient was kept in ICU overnight and was seen by Dr. Patty Sermons on day of discharge. She was found to be stable and in good spirits, ready to return home. A follow up appointment with Dr. Johney Frame is scheduled for 12 weeks. There were no changes in her medication regimen.   Discharge Exam: Blood pressure 100/49, pulse 62, temperature 98.5 F (36.9 C), temperature source Oral, resp. rate 12, height 5\' 11"  (1.803 m), weight 206 lb (93.441 kg), last menstrual period 04/17/2012, SpO2 95.00%.    Labs:   Lab Results  Component Value Date   WBC 8.4 07/22/2012   HGB 12.7 07/22/2012   HCT 38.1 07/22/2012   MCV 89.3 07/22/2012   PLT 270.0 07/22/2012    Lab 07/30/12 0614  NA 139  K 3.7  CL 103  CO2 25  BUN 12  CREATININE 0.74  CALCIUM 9.1  PROT --  BILITOT --  ALKPHOS --  ALT --  AST --  GLUCOSE 105*    Lab Results  Component Value Date   CHOL 205* 11/15/2006   Lab Results  Component Value Date   HDL 54.0 11/15/2006   No results found for this basename: [facility]   Lab Results  Component Value Date   TRIG 59 11/15/2006   Lab Results  Component Value Date   CHOLHDL 3.8 CALC 11/15/2006   Lab Results  Component Value Date   LDLDIRECT 139.5 11/15/2006        EKG:NSR with PAC's  FOLLOW UP PLANS AND APPOINTMENTS Discharge Orders    Future Appointments: Provider: Department: Dept Phone:  Center:   08/05/2012 9:15 AM Hillis Range, MD [facility] Main Office [place]) [phone] [facility]   10/27/2012 10:00 AM Hillis Range, MD [facility] Main Office [place]) [phone] [facility]     Future Orders Please Complete By Expires   Diet - low sodium heart healthy      Increase activity slowly       Call MD for:  temperature >100.4      Call MD for:  persistant nausea and vomiting      Call MD for:  severe uncontrolled pain      Call MD for:  redness, tenderness, or signs of infection (pain, swelling, redness, odor or green/yellow discharge around incision site)      Call MD for:  persistant dizziness or light-headedness          Medication List     As of 07/30/2012 10:58 AM    STOP taking these medications         oxyCODONE-acetaminophen 5-325 MG per tablet   Commonly known as: PERCOCET/ROXICET      TAKE these medications         acetaminophen 500 MG tablet   Commonly known as: TYLENOL   Take 500 mg by mouth at bedtime as needed. For pain      CALCIUM-VITAMIN D PO   Take 1 tablet by mouth daily.      Coenzyme Q10 200 MG capsule   Take 200 mg by mouth daily.      diazepam 5 MG tablet   Commonly known as: VALIUM   Take 5 mg by mouth at bedtime.      doxycycline 100 MG tablet   Commonly known as: VIBRA-TABS   Take 1 tablet (100 mg total) by mouth 2 (two) times daily.      estradiol 0.1 MG/24HR   Commonly known as: VIVELLE-DOT   Place 1 patch (0.1 mg total) onto the skin 2 (two) times a week.      fish oil-omega-3 fatty acids 1000 MG capsule   Take 2 g by mouth daily.      HYDROcodone-acetaminophen 5-325 MG per tablet   Commonly known as: NORCO/VICODIN   Take 1 tablet by mouth every 6 (six) hours as needed. For pain      metoprolol 200 MG 24 hr tablet   Commonly known as: TOPROL-XL   Take 200 mg by mouth at bedtime.      multivitamin with minerals Tabs   Take 1 tablet by mouth daily.      pantoprazole 40 MG tablet   Commonly known as: PROTONIX   TAKE 1 TABLET BY MOUTH EVERY DAY      progesterone 100 MG capsule   Commonly known as: PROMETRIUM   Take 100 mg by mouth at bedtime.      VITAMIN D PO   Take 1 tablet by mouth daily.      XARELTO 20 MG Tabs   Generic drug: Rivaroxaban   Take 20 mg by mouth at bedtime.           Follow-up  Information    Follow up with Hillis Range, MD. (12 weeks. Our office will call you for appointment)    Contact information:   [address] [place] [address] [phone]            Time spent with patient to include physician time:30 minutes Signed: Joni Reining 07/30/2012, 10:58 AM Co-Sign MD

## 2012-08-01 ENCOUNTER — Telehealth: Payer: Self-pay | Admitting: Internal Medicine

## 2012-08-01 NOTE — Telephone Encounter (Signed)
plz return call to pt at [phone] to discuss medication.

## 2012-08-02 NOTE — Telephone Encounter (Signed)
Called her and she has been is NSR all day and we are going to leave things as they are.  She was most likely dehydrated.  After talking with her yesterday she drank four bottles of water and things turned around. She has hasd a great day today

## 2012-08-05 ENCOUNTER — Ambulatory Visit: Payer: BC Managed Care – PPO | Admitting: Internal Medicine

## 2012-08-06 ENCOUNTER — Telehealth: Payer: Self-pay | Admitting: Physician Assistant

## 2012-08-06 NOTE — Telephone Encounter (Signed)
Patient called the answering service c/o palpitations and diaphoresis. She recently underwent RFCA for paroxysmal atrial fibrillation. She had failed flecainide and amioadarone. She reports feeling "in normal rhythm" (i.e. Without palpitations) for the first 4 days post-procedure. Today, she notices diaphoresis and exacerbation of palpitations described as a skipped beat whenever she bends over. HR 70-90 via BP cuff. No chest pain, SOB or lightheadedness. She just took Toprol-XL one hour ago. Advised to continue to monitor symptoms and allow BB to reach therapeutic concentration. Would expect some ectopy/paroxysmal a-fib in the acute post-op setting. If symptoms worsen or persist tonight or tomorrow, advised to present to urgent care or the ED for formal EKG and evaluation. Otherwise, advised to go to office on Monday for an EKG. Will leave message. She continues to take Xarelto. She understood and agreed.   Jacqulyn Bath, PA-C 08/06/2012 6:19 PM

## 2012-08-12 ENCOUNTER — Ambulatory Visit (INDEPENDENT_AMBULATORY_CARE_PROVIDER_SITE_OTHER): Payer: BC Managed Care – PPO

## 2012-08-12 VITALS — BP 134/82 | HR 67 | Ht 71.0 in | Wt 207.0 lb

## 2012-08-12 DIAGNOSIS — I4891 Unspecified atrial fibrillation: Secondary | ICD-10-CM

## 2012-08-12 NOTE — Progress Notes (Signed)
Pt. arrives in office for a EKG per Odella Aquas, PA-C due to phone conversation on 12/21 when pt. was having a lot of palpitations and diaphoresis. Pt. has no complaints at this time. She states that when she pulled into the parking garage at this office this morning she felt her heart go back into rhythm. EKG obtained and given to Dr. Johney Frame for his review. Per Dr. Johney Frame pt. is advised that her heart is back in sinus rhythm and to contact us if she becomes symptomatic. She verbalized understanding.

## 2012-08-29 ENCOUNTER — Telehealth: Payer: Self-pay | Admitting: Internal Medicine

## 2012-08-29 NOTE — Telephone Encounter (Signed)
She is c/o afib daily.  Never lasting mor than 1/2 day.  She usually happens in the morning and goes back to NSR around dinner time.  Just wants to know what to do.  I explained to her that there is a 3 month healing window.  She really does not want restart Amiodarone if at all possible.  I let her know I would forward to Dr Johney Frame for his suggestions

## 2012-08-29 NOTE — Telephone Encounter (Signed)
New Problem:    Patient called in because she is having Afib and wanted to consult with you.  Please call back.

## 2012-08-29 NOTE — Telephone Encounter (Signed)
lmom for patient to return my call 

## 2012-08-29 NOTE — Telephone Encounter (Signed)
Pt rtn call 

## 2012-09-02 NOTE — Telephone Encounter (Signed)
Discussed with Dr Johney Frame and spoke with the patient.  We can restart the Amiodarone, which she does not want to do or start Verapamil 120mg  daily.  She states things seem to be getting better and if she feels it gets to the point where she can not tolerate it she will let me know.

## 2012-09-12 ENCOUNTER — Other Ambulatory Visit: Payer: Self-pay | Admitting: Obstetrics and Gynecology

## 2012-09-12 DIAGNOSIS — Z1231 Encounter for screening mammogram for malignant neoplasm of breast: Secondary | ICD-10-CM

## 2012-09-20 ENCOUNTER — Ambulatory Visit (INDEPENDENT_AMBULATORY_CARE_PROVIDER_SITE_OTHER): Payer: Managed Care, Other (non HMO) | Admitting: Family Medicine

## 2012-09-20 ENCOUNTER — Encounter: Payer: Self-pay | Admitting: Family Medicine

## 2012-09-20 VITALS — BP 150/90 | HR 76 | Temp 98.2°F | Resp 20 | Wt 263.0 lb

## 2012-09-20 DIAGNOSIS — J329 Chronic sinusitis, unspecified: Secondary | ICD-10-CM

## 2012-09-20 MED ORDER — DOXYCYCLINE HYCLATE 100 MG PO TABS: 100.0000 mg | ORAL_TABLET | Freq: Two times a day (BID) | ORAL | Status: DC

## 2012-09-20 MED ORDER — BENZONATATE 100 MG PO CAPS: 100.0000 mg | ORAL_CAPSULE | Freq: Three times a day (TID) | ORAL | Status: DC | PRN

## 2012-09-20 NOTE — Progress Notes (Signed)
Chief Complaint  Patient presents with  . Cough  . Nasal Congestion    HPI:  Acute visit for cough and congestion: -started: 3-4 weeks with drainage and cough, but worsening cough for last 6 days with body aches, ears stopped up, sinus pain and pressure frontal/Rmax -symptoms:nasal congestion, sore throat, cough, see above -denies:fever, SOB, NVD, tooth pain, strep, flu or mono exposure -has tried: benadryl at night for drainage, tylenol -sick contacts: nephews sick with a cold and cough -Hx of: AR -on xarelto for her A. Fib and has taken doxy with this and did fine - prefers this -had reaction to Avelox in the past   ROS: See pertinent positives and negatives per HPI.  Past Medical History  Diagnosis Date  . Atrial fibrillation 03/05/2007    a. s/p RFCA 12/18/2011  . 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 GERD 12/23/2009  . Sialoadenitis 03/18/2010  . Hyperlipidemia   . PVC (premature ventricular contraction)   . History of MRSA infection 2008  . Allergic rhinitis   . Cervical disc disease   . Benign fundic gland polyps of stomach   . Hiatal hernia   . Diverticulitis     CT Scan   . Hemorrhoids   . Atrial flutter     a. s/p RFCA 12/18/2011  . Complication of anesthesia   . PONV (postoperative nausea and vomiting)   . Family history of anesthesia complication     " My Uncle "  . Headache   . Arthritis     Family History  Problem Relation Age of Onset  . Diabetes Maternal Grandfather   . Colon cancer Neg Hx   . Diverticulitis Brother     x 2  . Diverticulitis Paternal Grandmother     History   Social History  . Marital Status: Married    Spouse Name: N/A    Number of Children: 2  . Years of Education: N/A   Occupational History  . [employer]     Workers Comp   Social History Main Topics  . Smoking status: Never Smoker   . Smokeless tobacco: Never Used  . Alcohol Use: No  . Drug Use: No  . Sexually Active: None     Comment: has periods q  3-4 months- no possiblity pregnant per pt.07-16-11   Other Topics Concern  . None   Social History Narrative   Daily Caffeine Pt lives in [place] with spouse.  She works as a workers Runner, broadcasting/film/video.    Current outpatient prescriptions:acetaminophen (TYLENOL) 500 MG tablet, Take 500 mg by mouth at bedtime as needed. For pain, Disp: , Rfl: ;  CALCIUM-VITAMIN D PO, Take 1 tablet by mouth daily., Disp: , Rfl: ;  Cholecalciferol (VITAMIN D PO), Take 1 tablet by mouth daily., Disp: , Rfl: ;  Coenzyme Q10 200 MG capsule, Take 200 mg by mouth daily., Disp: , Rfl: ;  diazepam (VALIUM) 5 MG tablet, Take 5 mg by mouth at bedtime., Disp: , Rfl:  estradiol (VIVELLE-DOT) 0.1 MG/24HR, Place 1 patch (0.1 mg total) onto the skin 2 (two) times a week., Disp: 24 patch, Rfl: 12;  fish oil-omega-3 fatty acids 1000 MG capsule, Take 2 g by mouth daily., Disp: , Rfl: ;  HYDROcodone-acetaminophen (NORCO/VICODIN) 5-325 MG per tablet, Take 1 tablet by mouth every 6 (six) hours as needed., Disp: 30 tablet, Rfl: 0;  metoprolol (TOPROL-XL) 200 MG 24 hr tablet, Take 200 mg by mouth at bedtime. , Disp: , Rfl:  Multiple Vitamin (MULITIVITAMIN WITH MINERALS) TABS, Take 1 tablet by mouth daily., Disp: , Rfl: ;  pantoprazole (PROTONIX) 40 MG tablet, TAKE 1 TABLET BY MOUTH EVERY DAY, Disp: 30 tablet, Rfl: 5;  progesterone (PROMETRIUM) 100 MG capsule, Take 100 mg by mouth at bedtime., Disp: , Rfl: ;  Rivaroxaban (XARELTO) 20 MG TABS, Take 20 mg by mouth at bedtime., Disp: , Rfl:  benzonatate (TESSALON PERLES) 100 MG capsule, Take 1 capsule (100 mg total) by mouth 3 (three) times daily as needed for cough., Disp: 20 capsule, Rfl: 0;  doxycycline (VIBRA-TABS) 100 MG tablet, Take 1 tablet (100 mg total) by mouth 2 (two) times daily., Disp: 20 tablet, Rfl: 0;  [DISCONTINUED] flecainide (TAMBOCOR) 100 MG tablet, Take 1 tablet (100 mg total) by mouth 2 (two) times daily., Disp: 60 tablet, Rfl: 12  EXAM:  Filed Vitals:   09/20/12 1031   BP: 150/90  Pulse: 76  Temp: 98.2 F (36.8 C)  Resp: 20    There is no height on file to calculate BMI.  GENERAL: vitals reviewed and listed above, alert, oriented, appears well hydrated and in no acute distress  HEENT: atraumatic, conjunttiva clear, no obvious abnormalities on inspection of external nose and ears, normal appearance of ear canals and TMs, clear nasal congestion, mild post oropharyngeal erythema with PND, no tonsillar edema or exudate, R max sinus TTP  NECK: no obvious masses on inspection  LUNGS: clear to auscultation bilaterally, no wheezes, rales or rhonchi, good air movement  CV: HRRR, no peripheral edema  MS: moves all extremities without noticeable abnormality  PSYCH: pleasant and cooperative, no obvious depression or anxiety  ASSESSMENT AND PLAN:  Discussed the following assessment and plan:  1. Sinusitis  doxycycline (VIBRA-TABS) 100 MG tablet, benzonatate (TESSALON PERLES) 100 MG capsule   -discussed options - will do doxy and cough medication, discussed risks, supportive care and return precuations -Patient advised to return or notify a doctor immediately if symptoms worsen or persist or new concerns arise.  Patient Instructions  INSTRUCTIONS FOR UPPER RESPIRATORY INFECTION:  -plenty of rest and fluids  --As we discussed, we have prescribed a new medication (DOXYCYCLINE AND TESSALON PERLES) for you at this appointment. We discussed the common and serious potential adverse effects of this medication and you can review these and more with the pharmacist when you pick up your medication.  Please follow the instructions for use carefully and notify us immediately if you have any problems taking this medication.  -nasal saline wash 2-3 times daily (use prepackaged nasal saline or bottled/distilled water if making your own)   -can use sinex nasal spray for drainage and nasal congestion - but do NOT use longer then 3-4 days  -can use tylenol or  ibuprofen as directed for aches and sorethroat  -in the winter time, using a humidifier at night is helpful (please follow cleaning instructions)  -if you are taking a cough medication - use only as directed, may also try a teaspoon of honey to coat the throat and throat lozenges  -for sore throat, salt water gargles can help  -follow up if you have fevers, facial pain, tooth pain, difficulty breathing or are worsening or not getting better in 5-7 days      Clorene Nerio R.

## 2012-09-20 NOTE — Patient Instructions (Addendum)
INSTRUCTIONS FOR UPPER RESPIRATORY INFECTION:  -plenty of rest and fluids  --As we discussed, we have prescribed a new medication (DOXYCYCLINE AND TESSALON PERLES) for you at this appointment. We discussed the common and serious potential adverse effects of this medication and you can review these and more with the pharmacist when you pick up your medication.  Please follow the instructions for use carefully and notify us immediately if you have any problems taking this medication.  -nasal saline wash 2-3 times daily (use prepackaged nasal saline or bottled/distilled water if making your own)   -can use sinex nasal spray for drainage and nasal congestion - but do NOT use longer then 3-4 days  -can use tylenol or ibuprofen as directed for aches and sorethroat  -in the winter time, using a humidifier at night is helpful (please follow cleaning instructions)  -if you are taking a cough medication - use only as directed, may also try a teaspoon of honey to coat the throat and throat lozenges  -for sore throat, salt water gargles can help  -follow up if you have fevers, facial pain, tooth pain, difficulty breathing or are worsening or not getting better in 5-7 days

## 2012-09-21 ENCOUNTER — Telehealth: Payer: Self-pay | Admitting: Internal Medicine

## 2012-09-21 NOTE — Telephone Encounter (Signed)
Patient Information:  Caller Name: Deanie  Phone: [phone]  Patient: Destiny Dennis, Destiny Dennis  Gender: Female  DOB: 1960-12-21  Age: 52 Years  PCP: Birdie Sons (Adults only)  Pregnant: No  Office Follow Up:  Does the office need to follow up with this patient?: Yes  Instructions For The Office: Requesting cough Rx for night -see note  RN Note:  Unable to sleep due to cough spasms. Asking for cough medication for nightitme use.  Tessalon Pearles TID not helping at all.  Thought she may have some mild wheezing but its not present now. Started on Doxicycline for sinus infection and bronchitis 09/20/12. Moderate headache present that worsens with cough. Encourged to use humidifier in bedroom and hydrate with warm clear fluids for coughing spasms.  Since just seen 09/20/12, declined another appointment.  [facility].  Symptoms  Reason For Call & Symptoms: Severe intermittent spasms of coughing and mild wheezing. Diagnosed with bronchitis and sinus infection 09/20/12 and started onDoxicycline and Tessalon Pearles.  Reviewed Health History In EMR: Yes  Reviewed Medications In EMR: Yes  Reviewed Allergies In EMR: Yes  Reviewed Surgeries / Procedures: Yes  Date of Onset of Symptoms: 09/14/2012  Treatments Tried: Tessalon Pearles, Green Tea  Treatments Tried Worked: No OB / GYN:  LMP: 08/24/2012  Guideline(s) Used:  Cough  Disposition Per Guideline:   See Within 3 Days in Office  Reason For Disposition Reached:   Cough has been present for > 10 days  Advice Given:  Reassurance  Coughing is the way that our lungs remove irritants and mucus. It helps protect our lungs from getting pneumonia.  You can get a dry hacking cough after a chest cold. Sometimes this type of cough can last 1-3 weeks, and be worse at night.  Cough Medicines:  Home Remedy - Hard Candy: Hard candy works just as well as medicine-flavored OTC cough drops. Diabetics should use sugar-free candy.  Prevent Dehydration:  Drink adequate liquids.  Expected Course:   The expected course depends on what is causing the cough.  Viral bronchitis (chest cold) causes a cough that lasts 1 to 3 weeks. Sometimes you may cough up lots of phlegm (sputum, mucus). The mucus can normally be white, gray, yellow, or green.  RN Overrode Recommendation:  Patient Requests Prescription  Requeting cough suppresant for night.

## 2012-09-22 NOTE — Telephone Encounter (Signed)
Stop tessalon Can try robitussin dm

## 2012-09-22 NOTE — Telephone Encounter (Signed)
Left message on pts cell phone with advice

## 2012-10-10 ENCOUNTER — Ambulatory Visit: Payer: BC Managed Care – PPO

## 2012-10-11 ENCOUNTER — Ambulatory Visit
Admission: RE | Admit: 2012-10-11 | Discharge: 2012-10-11 | Disposition: A | Discharge status: Home / Self Care | Payer: Managed Care, Other (non HMO) | Source: Ambulatory Visit | Attending: Obstetrics and Gynecology | Admitting: Obstetrics and Gynecology

## 2012-10-11 DIAGNOSIS — Z1231 Encounter for screening mammogram for malignant neoplasm of breast: Secondary | ICD-10-CM

## 2012-10-18 ENCOUNTER — Encounter: Payer: BC Managed Care – PPO | Admitting: Obstetrics and Gynecology

## 2012-10-27 ENCOUNTER — Ambulatory Visit (INDEPENDENT_AMBULATORY_CARE_PROVIDER_SITE_OTHER): Payer: Managed Care, Other (non HMO) | Admitting: Internal Medicine

## 2012-10-27 ENCOUNTER — Encounter: Payer: Self-pay | Admitting: Internal Medicine

## 2012-10-27 VITALS — BP 143/78 | HR 73 | Wt 264.1 lb

## 2012-10-27 DIAGNOSIS — I4891 Unspecified atrial fibrillation: Secondary | ICD-10-CM

## 2012-10-27 MED ORDER — METOPROLOL SUCCINATE ER 25 MG PO TB24: ORAL_TABLET | ORAL | Status: DC

## 2012-10-27 MED ORDER — METOPROLOL TARTRATE 25 MG PO TABS: ORAL_TABLET | ORAL | Status: DC

## 2012-10-27 MED ORDER — RIVAROXABAN 20 MG PO TABS: 20.0000 mg | ORAL_TABLET | Freq: Every day | ORAL | Status: DC

## 2012-10-27 MED ORDER — METOPROLOL SUCCINATE ER 100 MG PO TB24: ORAL_TABLET | ORAL | Status: DC

## 2012-10-27 NOTE — Patient Instructions (Addendum)
Your physician recommends that you schedule a follow-up appointment in: 3 months with Dr Johney Frame   Your physician has recommended you make the following change in your medication:  1) Will give patient Metoprolol 25mg  to take 1-2 tablets every 6 hours as needs   Make sure the other dose of Metoprolol is 100mg  and take each morning

## 2012-10-27 NOTE — Progress Notes (Signed)
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  Since her afib ablation 12/13, the patient reports doing very well.  She denies procedure related complications.  She has had some ERAF, though this has been short lived. Today, she denies symptoms of chest pain, shortness of breath,  lower extremity edema, dizziness, presyncope, or syncope.  The patient is otherwise without complaint today.   Past Medical History  Diagnosis Date  . Atrial fibrillation 03/05/2007    a. s/p RFCA 12/18/2011  . 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 GERD 12/23/2009  . Sialoadenitis 03/18/2010  . Hyperlipidemia   . PVC (premature ventricular contraction)   . History of MRSA infection 2008  . Allergic rhinitis   . Cervical disc disease   . Benign fundic gland polyps of stomach   . Hiatal hernia   . Diverticulitis     CT Scan   . Hemorrhoids   . Atrial flutter     a. s/p RFCA 12/18/2011  . Complication of anesthesia   . PONV (postoperative nausea and vomiting)   . Family history of anesthesia complication     " My Uncle "  . Headache   . Arthritis    Past Surgical History  Procedure Laterality Date  . Knee surgery      x 9 Left Knee  . Cholecystectomy, laparoscopic  2008  . Upper gastrointestinal endoscopy  01/30/2010    hiatal hernia, fundic gland polyps  . Shoulder surgery      Right   . Elbow surgery      Right  . Foot surgery      Right   . Cervical fusion    . Cesarean section      x 2  . Wrist surgery      Left   . Cholecystectomy    . Flexible sigmoidoscopy      1990's  . Colonoscopy  07/16/2011    diverticulosis  . Tee without cardioversion  12/17/2011    Procedure: TRANSESOPHAGEAL ECHOCARDIOGRAM (TEE);  Surgeon: Laurey Morale, MD;  Location: [facility] ENDOSCOPY;  Service: Cardiovascular;  Laterality: N/A;  . Atrial fibrillation and atrial flutter ablation  12/17/11    PVI and  CTI ablations by Dr Johney Frame  . Tee without cardioversion  07/28/2012    Procedure: TRANSESOPHAGEAL ECHOCARDIOGRAM (TEE);  Surgeon: Vesta Mixer, MD;  Location: [facility] ENDOSCOPY;  Service: Cardiovascular;  Laterality: N/A;  . Atrial fibrillation ablation  07/29/2012    PVI by Dr Johney Frame (second procedure)    Current Outpatient Prescriptions  Medication Sig Dispense Refill  . acetaminophen (TYLENOL) 500 MG tablet Take 500 mg by mouth at bedtime as needed. For pain      . benzonatate (TESSALON PERLES) 100 MG capsule Take 1 capsule (100 mg total) by mouth 3 (three) times daily as needed for cough.  20 capsule  0  . CALCIUM-VITAMIN D PO Take 1 tablet by mouth daily.      . Cholecalciferol (VITAMIN D PO) Take 1 tablet by mouth daily.      . Coenzyme Q10 200 MG capsule Take 200 mg by mouth daily.      . diazepam (VALIUM) 5 MG tablet Take 5 mg by mouth at bedtime.      Marland Kitchen estradiol (VIVELLE-DOT) 0.1 MG/24HR Place 1 patch (0.1 mg total) onto the skin 2 (two) times a week.  24 patch  12  .  fish oil-omega-3 fatty acids 1000 MG capsule Take 2 g by mouth daily.      Marland Kitchen HYDROcodone-acetaminophen (NORCO/VICODIN) 5-325 MG per tablet Take 1 tablet by mouth every 6 (six) hours as needed.  30 tablet  0  . metoprolol (TOPROL-XL) 200 MG 24 hr tablet Take 200 mg by mouth at bedtime.       . Multiple Vitamin (MULITIVITAMIN WITH MINERALS) TABS Take 1 tablet by mouth daily.      . pantoprazole (PROTONIX) 40 MG tablet TAKE 1 TABLET BY MOUTH EVERY DAY  30 tablet  5  . progesterone (PROMETRIUM) 100 MG capsule Take 100 mg by mouth at bedtime.      . Rivaroxaban (XARELTO) 20 MG TABS Take 20 mg by mouth at bedtime.      . [DISCONTINUED] flecainide (TAMBOCOR) 100 MG tablet Take 1 tablet (100 mg total) by mouth 2 (two) times daily.  60 tablet  12   No current facility-administered medications for this visit.    Physical Exam: Filed Vitals:   10/27/12 1019  BP: 143/78  Pulse: 73  Weight: 264 lb 1.9 oz (119.804 kg)     GEN- The patient is well appearing, alert and oriented x 3 today.   Head- normocephalic, atraumatic Eyes-  Sclera clear, conjunctiva pink Ears- hearing intact Oropharynx- clear Lungs- Clear to ausculation bilaterally, normal work of breathing Heart- Regular rate and rhythm, no murmurs, rubs or gallops, PMI not laterally displaced GI- soft, NT, ND, + BS Extremities- no clubbing, cyanosis, or edema  ekg today reveals sinus rhythm 68 bpm, otherwise normal ekg  Assessment and Plan:  1. Afib Doing well s/p ablation Some ERAF, improves when taking toprol earlier in the day She is instructed to take toprol xl 100mg  qam. Metoprolol 25mg  q6h prn Continue xarelto  Return in 3 months

## 2012-10-27 NOTE — Addendum Note (Signed)
Addended by: Dennis Bast F on: 10/27/2012 11:52 AM   Modules accepted: Orders, Medications

## 2012-11-17 ENCOUNTER — Telehealth: Payer: Self-pay | Admitting: Internal Medicine

## 2012-11-17 NOTE — Telephone Encounter (Signed)
New problem    Having root canal and wants to know what she needs to do about her meds

## 2012-11-17 NOTE — Telephone Encounter (Signed)
lmom for patient to call me tomorrow and let me know when the root canal is and who is doing so I can send them the ok to hold the medcation

## 2012-11-18 NOTE — Telephone Encounter (Signed)
Follow Up    Pt calling in follow up on phone call from yesterday. Please call.

## 2012-11-18 NOTE — Telephone Encounter (Addendum)
Dr Johney Frame said hold Xarelto for 24 hours prior to root canal if necessary  Lmom for her to call me again

## 2012-11-18 NOTE — Telephone Encounter (Signed)
Spoke with patient ad she is aware of Dr Jenel Lucks recommendations  She will hold if necessary.. She is also going in and out of afib some.  Will let me know if this becomes a more constant problem

## 2012-11-18 NOTE — Telephone Encounter (Deleted)
Spoke with patient, she is aware of

## 2012-11-24 NOTE — Progress Notes (Signed)
Surgery scheduled for 12/12/12.  Preop appointment on 12/07/12 at 100pm.    Need orders in EPIC.  Thanks.

## 2012-11-28 ENCOUNTER — Encounter (HOSPITAL_COMMUNITY): Payer: Self-pay

## 2012-12-05 ENCOUNTER — Telehealth: Payer: Self-pay | Admitting: Internal Medicine

## 2012-12-05 NOTE — Telephone Encounter (Signed)
lmom for patient to call me back. 

## 2012-12-05 NOTE — Telephone Encounter (Signed)
New problem    Pt needs to talk to you about the increase of her a-fib

## 2012-12-06 NOTE — Patient Instructions (Addendum)
CHANNAH GODEAUX  12/06/2012                           YOUR PROCEDURE IS SCHEDULED ON: 12/12/12               PLEASE REPORT TO SHORT STAY CENTER AT : 5:00 AM               CALL THIS NUMBER IF ANY PROBLEMS THE DAY OF SURGERY :               [phone]                      REMEMBER:   Do not eat food or drink liquids AFTER MIDNIGHT    Take these medicines the morning of surgery with A SIP OF WATER:  METOPROLOL / PROTONIX / MAY TAKE METOPROLOL 25 FOR BREAK THRU A-FIB IF NEEDED / MAY TAKE HYDROCODONE IF NEEDED FOR PAIN   Do not wear jewelry, make-up   Do not wear lotions, powders, or perfumes.   Do not shave legs or underarms 12 hrs. before surgery (men may shave face)  Do not bring valuables to the hospital.  Contacts, dentures or bridgework may not be worn into surgery.  Leave suitcase in the car. After surgery it may be brought to your room.  For patients admitted to the hospital more than one night, checkout time is 11:00                          The day of discharge.   Patients discharged the day of surgery will not be allowed to drive home                             If going home same day of surgery, must have someone stay with you first                           24 hrs at home and arrange for some one to drive you home from hospital.    Special Instructions:   Please read over the following fact sheets that you were given:               1. MRSA  INFORMATION                      2. [place] PREPARING FOR SURGERY SHEET               3. INCENTIVE SPIROMETER                                                X_____________________________________________________________________        Failure to follow these instructions may result in cancellation of your surgery                                                         KRESTA TEMPLEMAN  12/07/2012  YOUR PROCEDURE IS SCHEDULED ON:               PLEASE REPORT TO SHORT STAY  CENTER AT :               CALL THIS NUMBER IF ANY PROBLEMS THE DAY OF SURGERY :               [phone]                      REMEMBER:   Do not eat food or drink liquids AFTER MIDNIGHT  May have clear liquids UNTIL 6 HOURS BEFORE SURGERY  Clear liquids include soda, tea, black coffee, apple or grape juice, broth.  Take these medicines the morning of surgery with A SIP OF WATER:   Do not wear jewelry, make-up   Do not wear lotions, powders, or perfumes.   Do not shave legs or underarms 12 hrs. before surgery (men may shave face)  Do not bring valuables to the hospital.  Contacts, dentures or bridgework may not be worn into surgery.  Leave suitcase in the car. After surgery it may be brought to your room.  For patients admitted to the hospital more than one night, checkout time is 11:00                          The day of discharge.   Patients discharged the day of surgery will not be allowed to drive home                             If going home same day of surgery, must have someone stay with you first                           24 hrs at home and arrange for some one to drive you home from hospital.    Special Instructions:   Please read over the following fact sheets that you were given:               1. MRSA  INFORMATION                      2. [place] PREPARING FOR SURGERY SHEET                                                X_____________________________________________________________________        Failure to follow these instructions may result in cancellation of your surgery

## 2012-12-07 ENCOUNTER — Encounter (HOSPITAL_COMMUNITY)
Admission: RE | Admit: 2012-12-07 | Discharge: 2012-12-07 | Disposition: A | Discharge status: Home / Self Care | Payer: Managed Care, Other (non HMO) | Source: Ambulatory Visit | Attending: Orthopedic Surgery | Admitting: Orthopedic Surgery

## 2012-12-07 ENCOUNTER — Encounter (HOSPITAL_COMMUNITY): Payer: Self-pay

## 2012-12-07 LAB — BASIC METABOLIC PANEL
BUN: 12 mg/dL (ref 6–23)
Calcium: 9.7 mg/dL (ref 8.4–10.5)
Creatinine, Ser: 0.78 mg/dL (ref 0.50–1.10)
GFR calc Af Amer: >90 mL/min (ref >90)
GFR calc non Af Amer: >90 mL/min (ref >90)

## 2012-12-07 LAB — CBC
MCHC: 34.1 g/dL (ref 30.0–36.0)
Platelets: 307 10*3/uL (ref 150–400)
RDW: 13.3 % (ref 11.5–15.5)
WBC: 6.6 10*3/uL (ref 4.0–10.5)

## 2012-12-07 LAB — PROTIME-INR
INR: 1.41 (ref 0.00–1.49)
Prothrombin Time: 16.9 seconds — ABNORMAL HIGH (ref 11.6–15.2)

## 2012-12-07 LAB — URINALYSIS, ROUTINE W REFLEX MICROSCOPIC
Bilirubin Urine: NEGATIVE
Glucose, UA: NEGATIVE mg/dL
Ketones, ur: NEGATIVE mg/dL
Leukocytes, UA: NEGATIVE
pH: 6 (ref 5.0–8.0)

## 2012-12-07 LAB — APTT: aPTT: 46 seconds — ABNORMAL HIGH (ref 24–37)

## 2012-12-07 LAB — SURGICAL PCR SCREEN
MRSA, PCR: NEGATIVE
Staphylococcus aureus: NEGATIVE

## 2012-12-08 ENCOUNTER — Encounter: Payer: Self-pay | Admitting: Internal Medicine

## 2012-12-08 NOTE — Telephone Encounter (Signed)
Destiny Dennis at 12/08/2012 12:19 PM   Status: Signed            New Problem:  Patient has been having more Afib and would like to speak with you about her medication. Please call back.

## 2012-12-08 NOTE — Telephone Encounter (Signed)
This encounter was created in error - please disregard.

## 2012-12-08 NOTE — Telephone Encounter (Signed)
New Problem:    Patient has been having more Afib and would like to speak with you about her medication.  Please call back.

## 2012-12-09 NOTE — Telephone Encounter (Signed)
She is going in for knee surgery on Monday.  After surgery she will call me and we will obtain a 24 hour monitor to assess her rate and rhythm.  She will call me when she is ready to do this

## 2012-12-09 NOTE — Progress Notes (Signed)
PT called PST and said she cut Blood Bank Bracelet off "because it was too tight at night" - new order placed for repeat Type and Screen to be done the am of surg.

## 2012-12-11 NOTE — H&P (Signed)
Destiny Dennis   Patient is being admitted for right Destiny knee arthroplasty. As well as a left knee poly Dennis, Destiny scar debridement and excision of saphenous neuroma.  Subjective:  Chief Complaint: bilaterally knee Dennis.  Right knee OA / Dennis. Left knee Dennis, Destiny cluck and painful saphenous neuroma.  HPI: [name], 52 y.o. female, has a history of Dennis and functional disability in the right knee due to arthritis and has failed non-surgical conservative treatments for greater than 12 weeks to include  NSAID's and/or analgesics and activity modification.  Onset of symptoms was gradual, starting 5 years ago with gradually worsening course since that time. The patient noted prior procedures on the knee to include  arthroplasty on the left knee(s).  Patient currently rates Dennis in the right knee(s) at 9 out of 10 with activity and  Dennis in the left knee at 8 out of 10 with activity. Patient has night Dennis, worsening of Dennis with activity and weight bearing, Dennis that interferes with activities of daily living, Dennis with passive range of motion, crepitus and joint swelling.  Patient has evidence of periarticular osteophytes and joint space narrowing by imaging studies. There is no active infection. Risks, benefits and expectations were discussed with the patient. Patient understand the risks, benefits and expectations and wishes to proceed with surgery.   D/C Plans:   Home with HHPT  Post-op Meds:   No Rx given  Tranexamic Acid:   Not to be given - CAD / a-fib  Decadron:    To be given  FYI:   Is on chronic Xarelto for a-fib. Will return to Xarelto 23 hours after surgery.   Patient Active Problem List   Diagnosis Date Noted  . Atrial flutter   . Symptomatic menopausal or female climacteric states 12/07/2011  . Anxiety 12/07/2011  . Obesity 06/10/2011  . Diverticulitis of sigmoid colon - suspected to be recurrent 04/27/2011  . SVT (supraventricular tachycardia)  02/11/2011  . Palpitations 02/11/2011  . Bronchitis 01/17/2011  . GERD 12/23/2009  . Atrial fibrillation 03/05/2007   Past Medical History  Diagnosis Date  . Atrial fibrillation 03/05/2007    a. s/p RFCA 12/18/2011  . 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 GERD 12/23/2009  . Sialoadenitis 03/18/2010  . PVC (premature ventricular contraction)   . History of MRSA infection 2008  . Allergic rhinitis   . Cervical disc disease   . Benign fundic gland polyps of stomach   . Hiatal hernia   . Diverticulitis     CT Scan   . Hemorrhoids   . Atrial flutter     a. s/p RFCA 12/18/2011  . Complication of anesthesia   . PONV (postoperative nausea and vomiting)   . Headache   . Arthritis   . Family history of anesthesia complication     " My Uncle " unsure of type of problem    Past Surgical History  Procedure Laterality Date  . Knee surgery      x 9 Left Knee  . Upper gastrointestinal endoscopy  01/30/2010    hiatal hernia, fundic gland polyps  . Shoulder surgery      Right   . Elbow surgery      Right  . Foot surgery      Right   . Cervical fusion    . Cesarean section      x 2  . Wrist surgery      Left   . Cholecystectomy    .  Flexible sigmoidoscopy      1990's  . Colonoscopy  07/16/2011    diverticulosis  . Tee without cardioversion  12/17/2011    Procedure: TRANSESOPHAGEAL ECHOCARDIOGRAM (TEE);  Surgeon: Laurey Morale, MD;  Location: [facility] ENDOSCOPY;  Service: Cardiovascular;  Laterality: N/A;  . Atrial fibrillation and atrial flutter ablation  12/17/11    PVI and CTI ablations by Dr Johney Frame  . Tee without cardioversion  07/28/2012    Procedure: TRANSESOPHAGEAL ECHOCARDIOGRAM (TEE);  Surgeon: Vesta Mixer, MD;  Location: [facility] ENDOSCOPY;  Service: Cardiovascular;  Laterality: N/A;  . Atrial fibrillation ablation  07/29/2012    PVI by Dr Johney Frame (second procedure)    No prescriptions prior to admission   Allergies  Allergen Reactions  . Avelox (Moxifloxacin Hcl In Nacl)  Other (See Comments)    Numbness & tingling Peripheral neuropathy    History  Substance Use Topics  . Smoking status: Never Smoker   . Smokeless tobacco: Never Used  . Alcohol Use: No    Family History  Problem Relation Age of Onset  . Diabetes Maternal Grandfather   . Colon cancer Neg Hx   . Diverticulitis Brother     x 2  . Diverticulitis Paternal Grandmother      Review of Systems  Constitutional: Negative.   HENT: Negative.   Eyes: Negative.   Respiratory: Negative.   Cardiovascular: Positive for palpitations.  Gastrointestinal: Positive for heartburn.  Genitourinary: Negative.   Musculoskeletal: Positive for myalgias, back Dennis and joint Dennis.  Skin: Negative.   Neurological: Negative.   Endo/Heme/Allergies: Positive for environmental allergies.  Psychiatric/Behavioral: Negative.     Objective:  Physical Exam  Constitutional: She is oriented to person, place, and time. She appears well-developed and well-nourished.  HENT:  Head: Normocephalic and atraumatic.  Mouth/Throat: Oropharynx is clear and moist.  Eyes: Pupils are equal, round, and reactive to light.  Neck: Neck supple. No JVD present. No tracheal deviation present. No thyromegaly present.  Cardiovascular: Normal rate and intact distal pulses.   Respiratory: Effort normal and breath sounds normal. No stridor. No respiratory distress. She has no wheezes.  GI: Soft. There is no tenderness. There is no guarding.  Musculoskeletal:       Right knee: She exhibits decreased range of motion, swelling and bony tenderness. She exhibits no effusion, no ecchymosis, no deformity, no laceration and no erythema. Tenderness found.       Left knee: She exhibits decreased range of motion, swelling and laceration. She exhibits no effusion, no ecchymosis, no deformity and no erythema. Tenderness found.  Lymphadenopathy:    She has no cervical adenopathy.  Neurological: She is alert and oriented to person, place, and time.   Skin: Skin is warm and dry.  Psychiatric: She has a normal mood and affect.     Labs:  Estimated body mass index is 36.85 kg/(m^2) as calculated from the following:   Height as of 08/12/12: 5\' 11"  (1.803 m).   Weight as of 10/27/12: 119.804 kg (264 lb 1.9 oz).   Imaging Review Plain radiographs demonstrate severe degenerative joint disease of the right knee(s). The overall alignment is neutral. The bone quality appears to be good for age and reported activity level.  Left knee imagesreveals previous Destiny knee arthroplasty.  Assessment/Plan:  End stage arthritis, right knee.  Left knee Dennis, Destiny clunk and painful neuroma.  The patient history, physical examination, clinical judgment of the provider and imaging studies are consistent with end stage degenerative joint disease of the  right knee(s) and Destiny knee arthroplasty is deemed medically necessary.  The left knee Dennis, Destiny clunk and painful neuroma have deemed that a scar debridement, poly Dennis and excision of painful neuroma is necessary.  The treatment options including medical management, injection therapy arthroscopy and arthroplasty were discussed at length. The risks and benefits of Destiny knee arthroplasty were presented and reviewed. The risks due to aseptic loosening, infection, stiffness, Destiny tracking problems, thromboembolic complications and other imponderables were discussed. The patient acknowledged the explanation, agreed to proceed with the plan and consent was signed. Patient is being admitted for inpatient treatment for surgery, Dennis control, PT, OT, prophylactic antibiotics, VTE prophylaxis, progressive ambulation and ADL's and discharge planning. The patient is planning to be discharged home with home health services.    Anastasio Auerbach Jisela Merlino   PAC  12/11/2012, 7:50 PM

## 2012-12-12 ENCOUNTER — Encounter (HOSPITAL_COMMUNITY): Payer: Self-pay | Admitting: Anesthesiology

## 2012-12-12 ENCOUNTER — Inpatient Hospital Stay (HOSPITAL_COMMUNITY)
Admission: RE | Admit: 2012-12-12 | Discharge: 2012-12-15 | DRG: 470 | Disposition: A | Discharge status: Home Health | Payer: Managed Care, Other (non HMO) | Source: Ambulatory Visit | Attending: Orthopedic Surgery | Admitting: Orthopedic Surgery

## 2012-12-12 ENCOUNTER — Ambulatory Visit (HOSPITAL_COMMUNITY): Payer: Managed Care, Other (non HMO) | Admitting: Anesthesiology

## 2012-12-12 ENCOUNTER — Encounter (HOSPITAL_COMMUNITY): Payer: Self-pay

## 2012-12-12 ENCOUNTER — Encounter (HOSPITAL_COMMUNITY)
Admission: RE | Disposition: A | Discharge status: Home Health | Payer: Self-pay | Source: Ambulatory Visit | Attending: Orthopedic Surgery

## 2012-12-12 DIAGNOSIS — E669 Obesity, unspecified: Secondary | ICD-10-CM | POA: Diagnosis present

## 2012-12-12 DIAGNOSIS — M1710 Unilateral primary osteoarthritis, unspecified knee: Principal | ICD-10-CM | POA: Diagnosis present

## 2012-12-12 DIAGNOSIS — Z6836 Body mass index (BMI) 36.0-36.9, adult: Secondary | ICD-10-CM

## 2012-12-12 DIAGNOSIS — T84029A Dislocation of unspecified internal joint prosthesis, initial encounter: Secondary | ICD-10-CM | POA: Diagnosis present

## 2012-12-12 DIAGNOSIS — Z96659 Presence of unspecified artificial knee joint: Secondary | ICD-10-CM

## 2012-12-12 DIAGNOSIS — I4892 Unspecified atrial flutter: Secondary | ICD-10-CM | POA: Diagnosis present

## 2012-12-12 DIAGNOSIS — K219 Gastro-esophageal reflux disease without esophagitis: Secondary | ICD-10-CM | POA: Diagnosis present

## 2012-12-12 DIAGNOSIS — Z96651 Presence of right artificial knee joint: Secondary | ICD-10-CM

## 2012-12-12 DIAGNOSIS — D62 Acute posthemorrhagic anemia: Secondary | ICD-10-CM | POA: Diagnosis not present

## 2012-12-12 DIAGNOSIS — I4891 Unspecified atrial fibrillation: Secondary | ICD-10-CM | POA: Diagnosis present

## 2012-12-12 DIAGNOSIS — Y831 Surgical operation with implant of artificial internal device as the cause of abnormal reaction of the patient, or of later complication, without mention of misadventure at the time of the procedure: Secondary | ICD-10-CM | POA: Diagnosis present

## 2012-12-12 DIAGNOSIS — L910 Hypertrophic scar: Secondary | ICD-10-CM | POA: Diagnosis present

## 2012-12-12 DIAGNOSIS — G5780 Other specified mononeuropathies of unspecified lower limb: Secondary | ICD-10-CM | POA: Diagnosis present

## 2012-12-12 DIAGNOSIS — Z8719 Personal history of other diseases of the digestive system: Secondary | ICD-10-CM

## 2012-12-12 DIAGNOSIS — F411 Generalized anxiety disorder: Secondary | ICD-10-CM | POA: Diagnosis present

## 2012-12-12 HISTORY — PX: TOTAL KNEE ARTHROPLASTY: SHX125

## 2012-12-12 HISTORY — PX: I & D KNEE WITH POLY EXCHANGE: SHX5024

## 2012-12-12 SURGERY — ARTHROPLASTY, KNEE, TOTAL
Anesthesia: Spinal | Site: Knee | Laterality: Right | Wound class: Clean

## 2012-12-12 MED ORDER — HYDROMORPHONE HCL PF 1 MG/ML IJ SOLN
0.2500 mg | INTRAMUSCULAR | Status: DC | PRN
  Administered 2012-12-12 (×2): 0.5 mg via INTRAVENOUS

## 2012-12-12 MED ORDER — CEFAZOLIN SODIUM-DEXTROSE 2-3 GM-% IV SOLR
INTRAVENOUS | Status: AC
  Filled 2012-12-12: qty 50

## 2012-12-12 MED ORDER — METHOCARBAMOL 500 MG PO TABS
500.0000 mg | ORAL_TABLET | Freq: Four times a day (QID) | ORAL | Status: DC | PRN
  Administered 2012-12-12 – 2012-12-15 (×7): 500 mg via ORAL
  Filled 2012-12-12 (×7): qty 1

## 2012-12-12 MED ORDER — ACETAMINOPHEN 10 MG/ML IV SOLN
INTRAVENOUS | Status: DC | PRN
  Administered 2012-12-12: 1000 mg via INTRAVENOUS

## 2012-12-12 MED ORDER — PANTOPRAZOLE SODIUM 40 MG PO TBEC
40.0000 mg | DELAYED_RELEASE_TABLET | Freq: Every day | ORAL | Status: DC
  Administered 2012-12-13 – 2012-12-15 (×3): 40 mg via ORAL
  Filled 2012-12-12 (×3): qty 1

## 2012-12-12 MED ORDER — MEPERIDINE HCL 50 MG/ML IJ SOLN: 6.2500 mg | INTRAMUSCULAR | Status: DC | PRN

## 2012-12-12 MED ORDER — BISACODYL 10 MG RE SUPP: 10.0000 mg | Freq: Every day | RECTAL | Status: DC | PRN

## 2012-12-12 MED ORDER — BUPIVACAINE IN DEXTROSE 0.75-8.25 % IT SOLN
INTRATHECAL | Status: DC | PRN
  Administered 2012-12-12: 2 mL via INTRATHECAL

## 2012-12-12 MED ORDER — PROMETHAZINE HCL 25 MG/ML IJ SOLN
6.2500 mg | INTRAMUSCULAR | Status: DC | PRN
  Administered 2012-12-12: 12.5 mg via INTRAVENOUS

## 2012-12-12 MED ORDER — KETAMINE HCL 10 MG/ML IJ SOLN
INTRAMUSCULAR | Status: DC | PRN
  Administered 2012-12-12 (×5): 10 mg via INTRAVENOUS

## 2012-12-12 MED ORDER — DOCUSATE SODIUM 100 MG PO CAPS
100.0000 mg | ORAL_CAPSULE | Freq: Two times a day (BID) | ORAL | Status: DC
  Administered 2012-12-12 – 2012-12-15 (×7): 100 mg via ORAL

## 2012-12-12 MED ORDER — DIPHENHYDRAMINE HCL 25 MG PO CAPS
25.0000 mg | ORAL_CAPSULE | Freq: Four times a day (QID) | ORAL | Status: DC | PRN
  Administered 2012-12-12 – 2012-12-14 (×3): 25 mg via ORAL
  Filled 2012-12-12 (×3): qty 1

## 2012-12-12 MED ORDER — METOPROLOL SUCCINATE ER 100 MG PO TB24: 100.0000 mg | ORAL_TABLET | Freq: Every day | ORAL | Status: DC

## 2012-12-12 MED ORDER — SODIUM CHLORIDE 0.9 % IJ SOLN
INTRAMUSCULAR | Status: DC | PRN
  Administered 2012-12-12: 09:00:00

## 2012-12-12 MED ORDER — MENTHOL 3 MG MT LOZG
1.0000 | LOZENGE | OROMUCOSAL | Status: DC | PRN
  Filled 2012-12-12: qty 9

## 2012-12-12 MED ORDER — METOCLOPRAMIDE HCL 5 MG/ML IJ SOLN
5.0000 mg | Freq: Three times a day (TID) | INTRAMUSCULAR | Status: DC | PRN
  Administered 2012-12-13: 10 mg via INTRAVENOUS
  Filled 2012-12-12: qty 2

## 2012-12-12 MED ORDER — BUPIVACAINE LIPOSOME 1.3 % IJ SUSP
20.0000 mL | Freq: Once | INTRAMUSCULAR | Status: DC
  Filled 2012-12-12: qty 20

## 2012-12-12 MED ORDER — DEXAMETHASONE SODIUM PHOSPHATE 10 MG/ML IJ SOLN
10.0000 mg | Freq: Once | INTRAMUSCULAR | Status: AC
  Administered 2012-12-12: 10 mg via INTRAVENOUS

## 2012-12-12 MED ORDER — FLEET ENEMA 7-19 GM/118ML RE ENEM: 1.0000 | ENEMA | Freq: Once | RECTAL | Status: AC | PRN

## 2012-12-12 MED ORDER — ACETAMINOPHEN 10 MG/ML IV SOLN
INTRAVENOUS | Status: AC
  Filled 2012-12-12: qty 100

## 2012-12-12 MED ORDER — OXYCODONE HCL 5 MG PO TABS: 5.0000 mg | ORAL_TABLET | Freq: Once | ORAL | Status: DC | PRN

## 2012-12-12 MED ORDER — FENTANYL CITRATE 0.05 MG/ML IJ SOLN
INTRAMUSCULAR | Status: DC | PRN
  Administered 2012-12-12: 50 ug via INTRAVENOUS

## 2012-12-12 MED ORDER — STERILE WATER FOR IRRIGATION IR SOLN
Status: DC | PRN
  Administered 2012-12-12: 3000 mL

## 2012-12-12 MED ORDER — PROMETHAZINE HCL 25 MG/ML IJ SOLN
INTRAMUSCULAR | Status: AC
  Filled 2012-12-12: qty 1

## 2012-12-12 MED ORDER — RIVAROXABAN 20 MG PO TABS
20.0000 mg | ORAL_TABLET | Freq: Every day | ORAL | Status: DC
  Administered 2012-12-13 – 2012-12-15 (×3): 20 mg via ORAL
  Filled 2012-12-12 (×5): qty 1

## 2012-12-12 MED ORDER — MIDAZOLAM HCL 5 MG/5ML IJ SOLN
INTRAMUSCULAR | Status: DC | PRN
  Administered 2012-12-12: 2 mg via INTRAVENOUS

## 2012-12-12 MED ORDER — DEXAMETHASONE SODIUM PHOSPHATE 10 MG/ML IJ SOLN
10.0000 mg | Freq: Once | INTRAMUSCULAR | Status: AC
  Administered 2012-12-13: 10 mg via INTRAVENOUS
  Filled 2012-12-12: qty 1

## 2012-12-12 MED ORDER — SODIUM CHLORIDE 0.9 % IR SOLN
Status: DC | PRN
  Administered 2012-12-12: 3000 mL

## 2012-12-12 MED ORDER — ONDANSETRON HCL 4 MG/2ML IJ SOLN
INTRAMUSCULAR | Status: DC | PRN
  Administered 2012-12-12: 4 mg via INTRAVENOUS

## 2012-12-12 MED ORDER — CHLORHEXIDINE GLUCONATE 4 % EX LIQD
60.0000 mL | Freq: Once | CUTANEOUS | Status: DC
  Filled 2012-12-12: qty 60

## 2012-12-12 MED ORDER — METOPROLOL TARTRATE 25 MG PO TABS
25.0000 mg | ORAL_TABLET | Freq: Four times a day (QID) | ORAL | Status: DC | PRN
  Administered 2012-12-15 (×2): 25 mg via ORAL
  Filled 2012-12-12: qty 2

## 2012-12-12 MED ORDER — ONDANSETRON HCL 4 MG/2ML IJ SOLN
4.0000 mg | Freq: Four times a day (QID) | INTRAMUSCULAR | Status: DC | PRN
  Administered 2012-12-12 – 2012-12-14 (×3): 4 mg via INTRAVENOUS
  Filled 2012-12-12 (×3): qty 2

## 2012-12-12 MED ORDER — PHENOL 1.4 % MT LIQD: 1.0000 | OROMUCOSAL | Status: DC | PRN

## 2012-12-12 MED ORDER — PHENYLEPHRINE HCL 10 MG/ML IJ SOLN
INTRAMUSCULAR | Status: DC | PRN
  Administered 2012-12-12 (×2): 40 ug via INTRAVENOUS

## 2012-12-12 MED ORDER — POLYETHYLENE GLYCOL 3350 17 G PO PACK
17.0000 g | PACK | Freq: Two times a day (BID) | ORAL | Status: DC
  Administered 2012-12-12 – 2012-12-15 (×7): 17 g via ORAL

## 2012-12-12 MED ORDER — FERROUS SULFATE 325 (65 FE) MG PO TABS
325.0000 mg | ORAL_TABLET | Freq: Three times a day (TID) | ORAL | Status: DC
  Administered 2012-12-12 – 2012-12-15 (×9): 325 mg via ORAL
  Filled 2012-12-12 (×12): qty 1

## 2012-12-12 MED ORDER — METHOCARBAMOL 100 MG/ML IJ SOLN
500.0000 mg | Freq: Four times a day (QID) | INTRAVENOUS | Status: DC | PRN
  Filled 2012-12-12: qty 5

## 2012-12-12 MED ORDER — ALUM & MAG HYDROXIDE-SIMETH 200-200-20 MG/5ML PO SUSP
30.0000 mL | ORAL | Status: DC | PRN
  Administered 2012-12-13: 30 mL via ORAL
  Filled 2012-12-12: qty 30

## 2012-12-12 MED ORDER — LACTATED RINGERS IV SOLN
INTRAVENOUS | Status: DC | PRN
  Administered 2012-12-12 (×4): via INTRAVENOUS

## 2012-12-12 MED ORDER — ONDANSETRON HCL 4 MG PO TABS: 4.0000 mg | ORAL_TABLET | Freq: Four times a day (QID) | ORAL | Status: DC | PRN

## 2012-12-12 MED ORDER — HYDROMORPHONE HCL PF 1 MG/ML IJ SOLN
INTRAMUSCULAR | Status: AC
  Filled 2012-12-12: qty 1

## 2012-12-12 MED ORDER — CELECOXIB 200 MG PO CAPS
200.0000 mg | ORAL_CAPSULE | Freq: Two times a day (BID) | ORAL | Status: DC
Start: 2012-12-12 — End: 2012-12-14
  Administered 2012-12-12 – 2012-12-13 (×4): 200 mg via ORAL
  Filled 2012-12-12 (×6): qty 1

## 2012-12-12 MED ORDER — METOCLOPRAMIDE HCL 10 MG PO TABS: 5.0000 mg | ORAL_TABLET | Freq: Three times a day (TID) | ORAL | Status: DC | PRN

## 2012-12-12 MED ORDER — OXYCODONE HCL 5 MG PO TABS
5.0000 mg | ORAL_TABLET | ORAL | Status: DC
  Administered 2012-12-12 (×2): 10 mg via ORAL
  Administered 2012-12-12 – 2012-12-14 (×9): 15 mg via ORAL
  Filled 2012-12-12: qty 15
  Filled 2012-12-12 (×7): qty 3
  Filled 2012-12-12: qty 2
  Filled 2012-12-12: qty 3
  Filled 2012-12-12: qty 2

## 2012-12-12 MED ORDER — DEXTROSE 5 % IV SOLN
3.0000 g | INTRAVENOUS | Status: AC
  Administered 2012-12-12: 3 g via INTRAVENOUS
  Filled 2012-12-12: qty 3000

## 2012-12-12 MED ORDER — ACETAMINOPHEN 10 MG/ML IV SOLN: 1000.0000 mg | Freq: Once | INTRAVENOUS | Status: DC | PRN

## 2012-12-12 MED ORDER — SODIUM CHLORIDE 0.9 % IV SOLN
INTRAVENOUS | Status: DC
  Administered 2012-12-12: 14:00:00 via INTRAVENOUS
  Filled 2012-12-12 (×14): qty 1000

## 2012-12-12 MED ORDER — DIAZEPAM 5 MG PO TABS
5.0000 mg | ORAL_TABLET | Freq: Every day | ORAL | Status: DC
  Administered 2012-12-12 – 2012-12-14 (×3): 5 mg via ORAL
  Filled 2012-12-12 (×3): qty 1

## 2012-12-12 MED ORDER — PROPOFOL 10 MG/ML IV EMUL
INTRAVENOUS | Status: DC | PRN
  Administered 2012-12-12: 100 ug/kg/min via INTRAVENOUS

## 2012-12-12 MED ORDER — HYDROMORPHONE HCL PF 1 MG/ML IJ SOLN
0.5000 mg | INTRAMUSCULAR | Status: DC | PRN
  Administered 2012-12-12: 2 mg via INTRAVENOUS
  Administered 2012-12-12: 1 mg via INTRAVENOUS
  Administered 2012-12-12: 2 mg via INTRAVENOUS
  Administered 2012-12-12 – 2012-12-13 (×2): 1 mg via INTRAVENOUS
  Administered 2012-12-13: 2 mg via INTRAVENOUS
  Administered 2012-12-13 – 2012-12-14 (×2): 1 mg via INTRAVENOUS
  Filled 2012-12-12 (×3): qty 1
  Filled 2012-12-12 (×2): qty 2
  Filled 2012-12-12: qty 1
  Filled 2012-12-12: qty 2
  Filled 2012-12-12 (×2): qty 1

## 2012-12-12 MED ORDER — METOPROLOL SUCCINATE ER 100 MG PO TB24
100.0000 mg | ORAL_TABLET | Freq: Every day | ORAL | Status: DC
  Administered 2012-12-13 – 2012-12-15 (×3): 100 mg via ORAL
  Filled 2012-12-12 (×3): qty 1

## 2012-12-12 MED ORDER — 0.9 % SODIUM CHLORIDE (POUR BTL) OPTIME
TOPICAL | Status: DC | PRN
  Administered 2012-12-12: 1000 mL

## 2012-12-12 MED ORDER — PROGESTERONE MICRONIZED 100 MG PO CAPS
100.0000 mg | ORAL_CAPSULE | Freq: Every day | ORAL | Status: DC
  Administered 2012-12-12 – 2012-12-14 (×3): 100 mg via ORAL
  Filled 2012-12-12 (×4): qty 1

## 2012-12-12 MED ORDER — OXYCODONE HCL 5 MG/5ML PO SOLN: 5.0000 mg | Freq: Once | ORAL | Status: DC | PRN

## 2012-12-12 MED ORDER — ZOLPIDEM TARTRATE 5 MG PO TABS: 5.0000 mg | ORAL_TABLET | Freq: Every evening | ORAL | Status: DC | PRN

## 2012-12-12 MED ORDER — CEFAZOLIN SODIUM-DEXTROSE 2-3 GM-% IV SOLR
2.0000 g | Freq: Four times a day (QID) | INTRAVENOUS | Status: AC
  Administered 2012-12-12 (×2): 2 g via INTRAVENOUS
  Filled 2012-12-12 (×2): qty 50

## 2012-12-12 MED ORDER — CEFAZOLIN SODIUM 1-5 GM-% IV SOLN
INTRAVENOUS | Status: AC
  Filled 2012-12-12: qty 50

## 2012-12-12 MED ORDER — ACETAMINOPHEN 10 MG/ML IV SOLN
1000.0000 mg | Freq: Four times a day (QID) | INTRAVENOUS | Status: AC
  Administered 2012-12-12 – 2012-12-13 (×4): 1000 mg via INTRAVENOUS
  Filled 2012-12-12 (×6): qty 100

## 2012-12-12 SURGICAL SUPPLY — 73 items
ADH SKN CLS APL DERMABOND .7 (GAUZE/BANDAGES/DRESSINGS) ×6
BAG SPEC THK2 15X12 ZIP CLS (MISCELLANEOUS) ×2
BAG ZIPLOCK 12X15 (MISCELLANEOUS) ×3 IMPLANT
BANDAGE ELASTIC 4 VELCRO ST LF (GAUZE/BANDAGES/DRESSINGS) ×2 IMPLANT
BANDAGE ELASTIC 6 VELCRO ST LF (GAUZE/BANDAGES/DRESSINGS) ×7 IMPLANT
BANDAGE ESMARK 6X9 LF (GAUZE/BANDAGES/DRESSINGS) ×2 IMPLANT
BLADE SAW SGTL 13.0X1.19X90.0M (BLADE) ×4 IMPLANT
BNDG CMPR 9X6 STRL LF SNTH (GAUZE/BANDAGES/DRESSINGS) ×4
BNDG COHESIVE 6X5 TAN STRL LF (GAUZE/BANDAGES/DRESSINGS) ×1 IMPLANT
BNDG ESMARK 6X9 LF (GAUZE/BANDAGES/DRESSINGS) ×6
BOWL SMART MIX CTS (DISPOSABLE) ×3 IMPLANT
CEMENT HV SMART SET (Cement) ×4 IMPLANT
CLOTH BEACON ORANGE TIMEOUT ST (SAFETY) ×3 IMPLANT
CONT SPECI 4OZ STER CLIK (MISCELLANEOUS) ×2 IMPLANT
CUFF TOURN SGL QUICK 34 (TOURNIQUET CUFF) ×6
CUFF TRNQT CYL 34X4X40X1 (TOURNIQUET CUFF) ×2 IMPLANT
DECANTER SPIKE VIAL GLASS SM (MISCELLANEOUS) ×3 IMPLANT
DERMABOND ADVANCED (GAUZE/BANDAGES/DRESSINGS) ×3
DERMABOND ADVANCED .7 DNX12 (GAUZE/BANDAGES/DRESSINGS) ×2 IMPLANT
DRAPE EXTREMITY BILATERAL (DRAPE) ×2 IMPLANT
DRAPE INCISE IOBAN 66X45 STRL (DRAPES) ×1 IMPLANT
DRAPE POUCH INSTRU U-SHP 10X18 (DRAPES) ×3 IMPLANT
DRAPE U-SHAPE 47X51 STRL (DRAPES) ×5 IMPLANT
DRSG ADAPTIC 3X8 NADH LF (GAUZE/BANDAGES/DRESSINGS) ×2 IMPLANT
DRSG AQUACEL AG ADV 3.5X10 (GAUZE/BANDAGES/DRESSINGS) ×3 IMPLANT
DRSG AQUACEL AG ADV 3.5X14 (GAUZE/BANDAGES/DRESSINGS) ×4 IMPLANT
DRSG PAD ABDOMINAL 8X10 ST (GAUZE/BANDAGES/DRESSINGS) ×4 IMPLANT
DRSG TEGADERM 4X4.75 (GAUZE/BANDAGES/DRESSINGS) ×5 IMPLANT
DURAPREP 26ML APPLICATOR (WOUND CARE) ×4 IMPLANT
ELECT REM PT RETURN 9FT ADLT (ELECTROSURGICAL) ×3
ELECTRODE REM PT RTRN 9FT ADLT (ELECTROSURGICAL) ×2 IMPLANT
EVACUATOR 1/8 PVC DRAIN (DRAIN) ×4 IMPLANT
FACESHIELD LNG OPTICON STERILE (SAFETY) ×15 IMPLANT
GAUZE SPONGE 2X2 8PLY STRL LF (GAUZE/BANDAGES/DRESSINGS) ×4 IMPLANT
GLOVE BIOGEL PI IND STRL 7.5 (GLOVE) ×2 IMPLANT
GLOVE BIOGEL PI IND STRL 8 (GLOVE) ×2 IMPLANT
GLOVE BIOGEL PI INDICATOR 7.5 (GLOVE) ×1
GLOVE BIOGEL PI INDICATOR 8 (GLOVE) ×1
GLOVE ECLIPSE 8.0 STRL XLNG CF (GLOVE) ×3 IMPLANT
GLOVE ORTHO TXT STRL SZ7.5 (GLOVE) ×6 IMPLANT
GOWN BRE IMP PREV XXLGXLNG (GOWN DISPOSABLE) ×6 IMPLANT
GOWN STRL NON-REIN LRG LVL3 (GOWN DISPOSABLE) ×3 IMPLANT
HANDPIECE INTERPULSE COAX TIP (DISPOSABLE) ×3
INSERT TIBIAL PFC 3 17.5 (Knees) ×2 IMPLANT
KIT BASIN OR (CUSTOM PROCEDURE TRAY) ×3 IMPLANT
MANIFOLD NEPTUNE II (INSTRUMENTS) ×3 IMPLANT
NDL SAFETY ECLIPSE 18X1.5 (NEEDLE) ×2 IMPLANT
NEEDLE HYPO 18GX1.5 SHARP (NEEDLE) ×3
NS IRRIG 1000ML POUR BTL (IV SOLUTION) ×5 IMPLANT
PACK TOTAL JOINT (CUSTOM PROCEDURE TRAY) ×3 IMPLANT
PADDING CAST COTTON 6X4 STRL (CAST SUPPLIES) ×4 IMPLANT
POSITIONER SURGICAL ARM (MISCELLANEOUS) ×2 IMPLANT
SET HNDPC FAN SPRY TIP SCT (DISPOSABLE) ×2 IMPLANT
SET PAD KNEE POSITIONER (MISCELLANEOUS) ×4 IMPLANT
SPONGE GAUZE 2X2 STER 10/PKG (GAUZE/BANDAGES/DRESSINGS) ×3
SPONGE GAUZE 4X4 12PLY (GAUZE/BANDAGES/DRESSINGS) ×4 IMPLANT
SPONGE LAP 18X18 X RAY DECT (DISPOSABLE) ×4 IMPLANT
STOCKINETTE 8 INCH (MISCELLANEOUS) ×1 IMPLANT
STRIP CLOSURE SKIN 1/2X4 (GAUZE/BANDAGES/DRESSINGS) ×2 IMPLANT
SUCTION FRAZIER 12FR DISP (SUCTIONS) ×3 IMPLANT
SUT MNCRL AB 4-0 PS2 18 (SUTURE) ×5 IMPLANT
SUT VIC AB 1 CT1 36 (SUTURE) ×7 IMPLANT
SUT VIC AB 2-0 CT1 27 (SUTURE) ×18
SUT VIC AB 2-0 CT1 TAPERPNT 27 (SUTURE) ×6 IMPLANT
SUT VLOC 180 0 24IN GS25 (SUTURE) ×4 IMPLANT
SWAB COLLECTION DEVICE MRSA (MISCELLANEOUS) ×2 IMPLANT
SYR 50ML LL SCALE MARK (SYRINGE) ×3 IMPLANT
SYR CONTROL 10ML LL (SYRINGE) ×1 IMPLANT
TOWEL OR 17X26 10 PK STRL BLUE (TOWEL DISPOSABLE) ×6 IMPLANT
TRAY FOLEY CATH 14FRSI W/METER (CATHETERS) ×3 IMPLANT
TUBE ANAEROBIC SPECIMEN COL (MISCELLANEOUS) ×2 IMPLANT
WATER STERILE IRR 1500ML POUR (IV SOLUTION) ×5 IMPLANT
WRAP KNEE MAXI GEL POST OP (GAUZE/BANDAGES/DRESSINGS) ×4 IMPLANT

## 2012-12-12 NOTE — Anesthesia Preprocedure Evaluation (Addendum)
Anesthesia Evaluation  Patient identified by MRN, date of birth, ID band Patient awake    Reviewed: Allergy & Precautions, H&P , NPO status , Patient's Chart, lab work & pertinent test results  History of Anesthesia Complications (+) PONVNegative for: history of anesthetic complications  Airway Mallampati: II TM Distance: >3 FB Neck ROM: Full    Dental  (+) Teeth Intact and Dental Advisory Given   Pulmonary neg pulmonary ROS,  breath sounds clear to auscultation  Pulmonary exam normal       Cardiovascular + dysrhythmias (TEE yesterday: normal LVF, valves OK, ?pinhole VSD) Atrial Fibrillation Rhythm:Irregular Rate:Normal     Neuro/Psych  Headaches, Anxiety    GI/Hepatic Neg liver ROS, hiatal hernia, GERD-  Controlled,  Endo/Other  negative endocrine ROS  Renal/GU negative Renal ROS     Musculoskeletal  (+) Arthritis -, Osteoarthritis,    Abdominal Normal abdominal exam  (+) + obese,   Peds  Hematology negative hematology ROS (+)   Anesthesia Other Findings   Reproductive/Obstetrics                           Anesthesia Physical  Anesthesia Plan  ASA: III  Anesthesia Plan: Spinal   Post-op Pain Management:    Induction: Intravenous  Airway Management Planned:   Additional Equipment:   Intra-op Plan:   Post-operative Plan:   Informed Consent: I have reviewed the patients History and Physical, chart, labs and discussed the procedure including the risks, benefits and alternatives for the proposed anesthesia with the patient or authorized representative who has indicated his/her understanding and acceptance.   Dental advisory given  Plan Discussed with: CRNA  Anesthesia Plan Comments:         Anesthesia Quick Evaluation

## 2012-12-12 NOTE — Op Note (Signed)
NAME:  Destiny Dennis                      MEDICAL RECORD NO.:  [id number]                             FACILITY:  [facility]      PHYSICIAN:  Madlyn Frankel. Charlann Boxer, M.D.  DATE OF BIRTH:  [birth year]      DATE OF PROCEDURE:  12/12/2012                                     OPERATIVE REPORT         PREOPERATIVE DIAGNOSIS:  Right knee osteoarthritis.      POSTOPERATIVE DIAGNOSIS:  Right knee osteoarthritis.      FINDINGS:  The patient was noted to have complete loss of cartilage and   bone-on-bone arthritis with associated osteophytes in all three compartments of   the knee with a significant synovitis and associated effusion.      PROCEDURE:  Right total knee replacement.      COMPONENTS USED:  DePuy rotating platform posterior stabilized knee   system, a size 4N femur, 3 tibia, 10 mm PS insert, and 38 patellar   button.      SURGEON:  Madlyn Frankel. Charlann Boxer, M.D.      ASSISTANT:  Lanney Gins, PA-C.      ANESTHESIA:  Spinal.      SPECIMENS:  None.      COMPLICATION:  None.      DRAINS:  One Hemovac in the right knee and one in the left  EBL: <100cc      TOURNIQUET TIME:   Total Tourniquet Time Documented: Thigh (Left) - 22 minutes Total: Thigh (Left) - 22 minutes  Thigh (Right) - 51 minutes Total: Thigh (Right) - 51 minutes  .      The patient was stable to the recovery room.      INDICATION FOR PROCEDURE:  Destiny Dennis is a 52 y.o. female patient of   mine.  The patient had been seen, evaluated, and treated conservatively in the   office with medication, activity modification, and injections.  The patient had   radiographic changes of bone-on-bone arthritis with endplate sclerosis and osteophytes noted.      The patient failed conservative measures including medication, injections, and activity modification, and at this point was ready for more definitive measures.   Based on the radiographic changes and failed conservative measures, the patient   decided to proceed with  total knee replacement.  Risks of infection,   DVT, component failure, need for revision surgery, postop course, and   expectations were all   discussed and reviewed.  Consent was obtained for benefit of pain   relief.      PROCEDURE IN DETAIL:  The patient was brought to the operative theater.   Once adequate anesthesia, preoperative antibiotics, 3 gm of Ancef administered, the patient was positioned supine with the right thigh tourniquet placed.  The  right lower extremity was prepped and draped in sterile fashion along with the left knee simultaneously.   A time-   out was performed identifying the patient, planned procedure, and this  extremity.      The right lower extremity was placed in the [facility] leg holder.  The leg was   exsanguinated,  tourniquet elevated to 250 mmHg.  A midline incision was   made followed by median parapatellar arthrotomy.  Following initial   exposure, attention was first directed to the patella.  Precut   measurement was noted to be 22 mm.  I resected down to 14 mm and used a   38 patellar button to restore patellar height as well as cover the cut   surface.      The lug holes were drilled and a metal shim was placed to protect the   patella from retractors and saw blades.      At this point, attention was now directed to the femur.  The femoral   canal was opened with a drill, irrigated to try to prevent fat emboli.  An   intramedullary rod was passed at 5 degrees valgus, 8 mm of bone was   resected off the distal femur.  Following this resection, the tibia was   subluxated anteriorly.  Using the extramedullary guide, 8 mm of bone was resected off   the proximal lateral tibia.  We confirmed the gap would be   stable medially and laterally with a 10 mm insert as well as confirmed   the cut was perpendicular in the coronal plane, checking with an alignment rod.      Once this was done, I sized the femur to be a size 4 in the anterior-   posterior  dimension, chose a narrow component based on medial and   lateral dimension.  The size 4 rotation block was then pinned in   position anterior referenced using the C-clamp to set rotation.  The   anterior, posterior, and  chamfer cuts were made without difficulty nor   notching making certain that I was along the anterior cortex to help   with flexion gap stability.      The final box cut was made off the lateral aspect of distal femur.      At this point, the tibia was sized to be a size 3, the size 3 tray was   then pinned in position through the medial third of the tubercle,   drilled, and keel punched.  Trial reduction was now carried with a 4N femur,  3 tibia, a 10 mm insert, and the 38 patella botton.  The knee was brought to   extension, full extension with good flexion stability with the patella   tracking through the trochlea without application of pressure.  Given   all these findings, the trial components removed.  Final components were   opened and cement was mixed.  The knee was irrigated with normal saline   solution and pulse lavage.  The synovial lining was   then injected with 10cc Exparel with 20cc of saline.      The knee was irrigated.  Final implants were then cemented onto clean and   dried cut surfaces of bone with the knee brought to extension with a 10 mm trial insert.      Once the cement had fully cured, the excess cement was removed   throughout the knee.  I confirmed I was satisfied with the range of   motion and stability, and the final 10 mm PS insert was chosen.  It was   placed into the knee.      The tourniquet had been let down at 51 minutes.  No significant   hemostasis required.  The medium Hemovac drain was placed deep.  The   extensor  mechanism was then reapproximated using #1 Vicryl with the knee   in flexion.  The   remaining wound was closed with 2-0 Vicryl and running 4-0 Monocryl.   The knee was cleaned, dried, dressed sterilely using  Dermabond and   Aquacel dressing.  Drain site dressed separately.  The patient was then   brought to recovery room in stable condition, tolerating the procedure   well.   Please note that Physician Assistant, Lanney Gins, was present for the entirety of the case, and was utilized for pre-operative positioning, peri-operative retractor management, general facilitation of the procedure.  He was also utilized for primary wound closure at the end of the case.              Madlyn Frankel Charlann Boxer, M.D.

## 2012-12-12 NOTE — Transfer of Care (Signed)
Immediate Anesthesia Transfer of Care Note  Patient: Destiny Dennis  Procedure(s) Performed: Procedure(s): RIGHT TOTAL KNEE ARTHROPLASTY (Right) LEFT KNEE EXCISION SAPHENOUS NEUROMA/OPEN SCAR DEBRIDEMENT/POLY EXCHANGE/NERVE EXCISION (Left)  Patient Location: PACU  Anesthesia Type:Spinal  Level of Consciousness: awake, alert , oriented and patient cooperative  Airway & Oxygen Therapy: Patient Spontanous Breathing and Patient connected to face mask oxygen  Post-op Assessment: Report given to PACU RN and Post -op Vital signs reviewed and stable  Post vital signs: Reviewed and stable  Complications: No apparent anesthesia complications

## 2012-12-12 NOTE — Progress Notes (Signed)
PT Cancellation Note  Patient Details Name: Destiny Dennis MRN: [id number] DOB: 1960/12/24   Cancelled Treatment:    Reason Eval/Treat Not Completed: Pain limiting ability to participate. Attempted PT eval on POD 0. Will check back on tomorrow. Thanks.   Rebeca Alert, MPT Pager: [phone]

## 2012-12-12 NOTE — Anesthesia Procedure Notes (Signed)
Spinal  Patient location during procedure: OR Start time: 12/12/2012 7:34 AM End time: 12/12/2012 7:38 AM Staffing Anesthesiologist: Lewie Loron R Performed by: anesthesiologist  Preanesthetic Checklist Completed: patient identified, site marked, surgical consent, pre-op evaluation, timeout performed, IV checked, risks and benefits discussed and monitors and equipment checked Spinal Block Patient position: sitting Prep: ChloraPrep Patient monitoring: heart rate, continuous pulse ox and blood pressure Approach: midline Location: L3-4 Injection technique: single-shot Needle Needle type: Sprotte  Needle gauge: 24 G Needle length: 9 cm Assessment Sensory level: T8 Additional Notes Expiration date of kit checked and confirmed. Patient tolerated procedure well, without complications.

## 2012-12-12 NOTE — Preoperative (Signed)
Beta Blockers   Reason not to administer Beta Blockers:Not Applicable 

## 2012-12-12 NOTE — Interval H&P Note (Signed)
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 After consideration of risks, benefits and other options for treatment, the patient has consented to  Procedure(s): RIGHT TOTAL KNEE ARTHROPLASTY (Right) LEFT KNEE EXCISION SAPHENOUS NEUROMA/OPEN SCAR DEBRIDEMENT/POLY EXCHANGE (Left) as a surgical intervention .  The patient's history has been reviewed, patient examined, no change in status, stable for surgery.  I have reviewed the patient's chart and labs.  Questions were answered to the patient's satisfaction.     Shelda Pal

## 2012-12-12 NOTE — Anesthesia Postprocedure Evaluation (Signed)
Anesthesia Post Note  Patient: BLAYKE PINERA  Procedure(s) Performed: Procedure(s) (LRB): RIGHT TOTAL KNEE ARTHROPLASTY (Right) LEFT KNEE EXCISION SAPHENOUS NEUROMA/OPEN SCAR DEBRIDEMENT/POLY EXCHANGE/NERVE EXCISION (Left)  Anesthesia type: Spinal  Patient location: PACU  Post pain: Pain level controlled  Post assessment: Post-op Vital signs reviewed  Last Vitals: BP 106/60  Pulse 55  Temp(Src) 36.9 C (Oral)  Resp 16  Ht 5\' 11"  (1.803 m)  Wt 265 lb 2 oz (120.26 kg)  BMI 36.99 kg/m2  SpO2 98%  LMP 04/17/2012  Post vital signs: Reviewed  Level of consciousness: sedated  Complications: No apparent anesthesia complications

## 2012-12-12 NOTE — Brief Op Note (Signed)
12/12/2012  10:15 AM  PATIENT:  Destiny Dennis  52 y.o. female  PRE-OPERATIVE DIAGNOSIS:  1.  Osteoarthitis of the right knee.  2.  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  Osteoarthitis of the right knee.  2.  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 Right total knee replacement - under separate operative note  2. LEFT KNEE EXCISION SAPHENOUS NEUROMA/OPEN SCAR DEBRIDEMENT/POLY EXCHANGE/NERVE EXCISION (Left)  SURGEON:  Surgeon(s) and Role:    * Shelda Pal, MD - Primary  PHYSICIAN ASSISTANT: Lanney Gins, PA-C  ANESTHESIA:   spinal  EBL:  Total I/O In: 2000 [I.V.:2000] Out: 950 [Urine:850; Blood:100]  BLOOD ADMINISTERED:none  DRAINS: (1 medium) Hemovact drain(s) in each knee with  Suction Open   LOCAL MEDICATIONS USED:  OTHER Exparel  SPECIMEN:  No Specimen  DISPOSITION OF SPECIMEN:  N/A  COUNTS:  YES  TOURNIQUET:   Total Tourniquet Time Documented: Thigh (Left) - 22 minutes Total: Thigh (Left) - 22 minutes  Thigh (Right) - 51 minutes Total: Thigh (Right) - 51 minutes   DICTATION: .Other Dictation: Dictation Number [id number]  PLAN OF CARE: Admit to inpatient   PATIENT DISPOSITION:  PACU - hemodynamically stable.   Delay start of Pharmacological VTE agent (>24hrs) due to surgical blood loss or risk of bleeding: no

## 2012-12-13 LAB — BASIC METABOLIC PANEL WITH GFR
BUN: 10 mg/dL (ref 6–23)
CO2: 24 meq/L (ref 19–32)
Calcium: 8 mg/dL — ABNORMAL LOW (ref 8.4–10.5)
Chloride: 101 meq/L (ref 96–112)
Creatinine, Ser: 0.7 mg/dL (ref 0.50–1.10)
GFR calc Af Amer: >90 mL/min
GFR calc non Af Amer: >90 mL/min
Glucose, Bld: 124 mg/dL — ABNORMAL HIGH (ref 70–99)
Potassium: 3.6 meq/L (ref 3.5–5.1)
Sodium: 135 meq/L (ref 135–145)

## 2012-12-13 LAB — CBC
HCT: 29.8 % — ABNORMAL LOW (ref 36.0–46.0)
Hemoglobin: 9.9 g/dL — ABNORMAL LOW (ref 12.0–15.0)
MCH: 28.6 pg (ref 26.0–34.0)
MCHC: 33.2 g/dL (ref 30.0–36.0)
MCV: 86.1 fL (ref 78.0–100.0)
Platelets: 299 K/uL (ref 150–400)
RBC: 3.46 MIL/uL — ABNORMAL LOW (ref 3.87–5.11)
RDW: 13.5 % (ref 11.5–15.5)
WBC: 9.6 K/uL (ref 4.0–10.5)

## 2012-12-13 NOTE — Progress Notes (Signed)
Patient ID: Destiny Dennis, female   DOB: 1960-09-10, 52 y.o.   MRN: [id number]  Subjective: 1 Day Post-Op Procedure(s) (LRB): RIGHT TOTAL KNEE ARTHROPLASTY (Right) LEFT KNEE EXCISION SAPHENOUS NEUROMA/OPEN SCAR DEBRIDEMENT/POLY EXCHANGE/NERVE EXCISION (Left)    Patient reports pain as moderate.  Noticed that she felt a little better after this surgery than her index left knee surgery  Objective:   VITALS:   Filed Vitals:   12/13/12 1031  BP:   Pulse:   Temp: 99 F (37.2 C)  Resp: 16    Neurovascular intact Incision: dressing C/D/I HV drains in but will be removed this pm  LABS  Recent Labs  12/13/12 0430  HGB 9.9*  HCT 29.8*  WBC 9.6  PLT 299     Recent Labs  12/13/12 0430  NA 135  K 3.6  BUN 10  CREATININE 0.70  GLUCOSE 124*     Recent Labs  12/12/12 0550  INR 0.98     Assessment/Plan: 1 Day Post-Op Procedure(s) (LRB): RIGHT TOTAL KNEE ARTHROPLASTY (Right) LEFT KNEE EXCISION SAPHENOUS NEUROMA/OPEN SCAR DEBRIDEMENT/POLY EXCHANGE/NERVE EXCISION (Left)   Advance diet Up with therapy, D/C plan pending therapy evaluation and work with stairs particularly

## 2012-12-13 NOTE — Op Note (Signed)
NAME:  Destiny Dennis, Destiny Dennis NO.:  [id number]  MEDICAL RECORD NO.:  [id number]  LOCATION:  1613                         FACILITY:  [facility]  PHYSICIAN:  Madlyn Frankel. Charlann Boxer, M.D.  DATE OF BIRTH:  [birth year]  DATE OF PROCEDURE:  12/12/2012 DATE OF DISCHARGE:                              OPERATIVE REPORT   PREOPERATIVE DIAGNOSES: 1. Right knee advanced osteoarthritis, failing conservative measures 2. Left knee painful saphenous neuroma, left knee patella clunk with     excessive painful scar, flexion instability of the left knee,     status post knee arthroplasty and revision.  POSTOPERATIVE DIAGNOSIS: 1. Right knee advanced osteoarthritis, failing conservative measures     2.  Left knee painful saphenous neuroma, left knee patella clunk     with excessive painful scar, flexion instability of the left knee,     status post knee arthroplasty and revision.  PROCEDURES: 1. Right total knee arthroplasty.  This will be under separate     operative note in the system. 2. Left knee open excision of saphenous nerve or neuroma. 3. Left knee excisional scar debridement. 4. Left knee polyethylene exchange with size 15-mm polyethylene insert     to a 17.5 posterior stabilized insert.  SURGEON:  Madlyn Frankel. Charlann Boxer, MD  ASSISTANT:  Lanney Gins, PA-C.  Note that Mr. Carmon Sails was present for the entirety of the case from preoperative positioning, perioperative management operative extremity, general facilitation of the case, primary wound closure for both knees.  ANESTHESIA:  Spinal.  SPECIMENS:  None.  COMPLICATIONS:  None.  DRAINS:  Hemovac in each knee.  TOURNIQUET:  Tourniquet time on the left knee was 22 minutes. Tourniquet time on the right knee was 51 minute, will be noted in the other operative note.  INDICATIONS FOR PROCEDURE:  The primary discussion at this point is regarding her left knee.  Her right knee had advanced right knee osteoarthritis with failed  conservative attempts with multiple recurrent effusions and chondrolysis identified.  She failed conservative measures and at this point, is ready to proceed with more definitive measures of total knee replacement.  She had been through this before and understood the complications of stiffness, in addition the risks of infection, DVT, need for potential revision surgery as well as the postoperative course and expectations.  For her left knee, she had been seen and evaluated the left knee over the years.  She has had a total knee replacement now for at least 3 years with persistent pain and stiffness.  She has been seen and evaluated by myself in the past for consideration of flexion instability and revision surgery.  She has had a revision surgery from increase in her polyethylene from size 10-15 mm insert in the past.  She had been seen and evaluated in the office recently predominantly for her right knee surgery, but also this recurring left knee problems in the office identified hypersensitive and painful proximal medial tibia consistent with this saphenous nerve branch consistently seen with midline incisions.  We did a diagnostic injection of Marcaine clearing up a significant amount of pain issues have in this area and she wished to have this addressed as well.  She  also was identified to have significant patella clunk of scarring and crepitation in the knee.  It was painful to her, limiting her ability to function normal level, rising from a chair or stair climbing.  Given all the findings that were discussed at that time, we planned the above stated procedure and the risks of recurrence of scarring, recurrence of instability and need for future surgery discussed in addition to standard risk of infection, DVT. Consent was obtained for both.  PROCEDURE IN DETAIL:  Again this dictation is for the left knee only. Both knees were simultaneously prepped and draped in sterile  fashion. Time-out was performed identifying the patient, planned procedures and this initial left lower extremity.  The left lower extremity was then prepped and draped.  Both knees were prepped and draped in sterile fashion.  The left knee was then exsanguinated and tourniquet elevated to 250 mmHg.  The patient's old incision was excised sharply with a scalpel of its entire length.  The soft tissue planes were created.  I focused on the proximal medial tibia area and basically the subcutaneous tissue from the skin area down to the capsule was excised to the midline of her medial aspect of the knee. Once this was carried out, a median arthrotomy was made.  We encounter clear synovial fluid.  No large effusion identified.  There was a significant amount of scarring around the patella.  At this point, the medial and lateral gutters of the suprapatellar pouch were recreated by removing scar tissue in this area.  Once the knee exposure was carried out, I was able to sublux the patella laterally and with a proximal medial peel, I was able to remove the old polyethylene insert.  Upon removing the old polyethylene insert, I was able to remove further scar from posterior aspect knee as well as laterally around the lateral tray.  I then irrigated the knee out with copious normal saline solution.  I did a trial reduction with the 17.5 poly noting still slight bit of hyperextension but improved stability and flexion with no real play at 90 degrees of flexion medially and laterally.  Given these findings, we injected the knee at this point, the synovial capsule junction with Exparel 10 mL and 20 mL of saline. The final polyethylene insert was selected as a 17.5-mm posterior stabilized insert with a matching size 3 femur.  It was then placed in the knee.  The knee was then irrigated again.  Extensor mechanism reapproximated using #1 Vicryl, the knee in flexion and 0 V-Loc suture. The remaining of  the wound was closed with 2-0 Vicryl and running 4-0 Monocryl.  The knee was cleaned, dried, and  dressed sterilely using Dermabond and Aquacel dressing.  Drain site dressed separately.  During the conclusion of this case, attention was directed to the right knee as will be addressed in a separate operative note.     Madlyn Frankel Charlann Boxer, M.D.    MDO/MEDQ  D:  12/12/2012  T:  12/13/2012  Job:  [id number]

## 2012-12-13 NOTE — Care Management Note (Addendum)
    Page 1 of 2   12/15/2012     3:20:26 PM   CARE MANAGEMENT NOTE 12/15/2012  Patient:  TOSHIBA, NULL   Account Number:  [id number]  Date Initiated:  12/13/2012  Documentation initiated by:  Lorenda Ishihara  Subjective/Objective Assessment:   52 yo female admitted s/p right total knee arthroplasty, left knee excisions. PTA lived at home with spouse.     Action/Plan:   Home when stable   Anticipated DC Date:  12/15/2012   Anticipated DC Plan:  HOME W HOME HEALTH SERVICES      DC Planning Services  CM consult      [facility] Choice  HOME HEALTH  DURABLE MEDICAL EQUIPMENT   Choice offered to / List presented to:  C-1 Patient   DME arranged  [facility] BED      DME agency  [facility]     HH arranged  HH-2 PT      [facility] agency  [facility]   Status of service:  Completed, signed off Medicare Important Message given?   (If response is "NO", the following Medicare IM given date fields will be blank) Date Medicare IM given:   Date Additional Medicare IM given:    Discharge Disposition:  HOME W HOME HEALTH SERVICES  Per UR Regulation:  Reviewed for med. necessity/level of care/duration of stay  If discussed at Long Length of Stay Meetings, dates discussed:    Comments:  12/15/2012 Colleen Can BSN RN CCM [phone] Ordrs received for hospital bed. Referral to [facility]. Hospital bed was approved by pt's insurance per [facility] rep. [facility] rep advised that patient would be called via phone and delivery arrangements would be made. Pt discharged to home with [facility] services in place which will start tomorrow 12/16/2012.

## 2012-12-13 NOTE — Evaluation (Signed)
Physical Therapy Evaluation Patient Details Name: Destiny Dennis MRN: [id number] DOB: 06-01-1961 Today's Date: 12/13/2012 Time: 7425-9563 PT Time Calculation (min): 37 min  PT Assessment / Plan / Recommendation Clinical Impression  pt is s/p R TKA and poly exchange L knee wtih scar excision    PT Assessment  Patient needs continued PT services    Follow Up Recommendations  Home health PT;Supervision for mobility/OOB    Does the patient have the potential to tolerate intense rehabilitation      Barriers to Discharge        Equipment Recommendations  Rolling walker with 5" wheels (3in1)    Recommendations for Other Services     Frequency 7X/week    Precautions / Restrictions Precautions Precautions: Knee Restrictions RLE Weight Bearing: Weight bearing as tolerated LLE Weight Bearing: Weight bearing as tolerated   Pertinent Vitals/Pain Pt was premedicated; Rknee more painful than left     Mobility  Bed Mobility Bed Mobility: Supine to Sit Supine to Sit: 3: Mod assist;With rails;HOB flat Details for Bed Mobility Assistance: increased time Transfers Transfers: Sit to Stand;Stand to Sit;Stand Pivot Transfers Sit to Stand: 1: +2 Total assist;From bed;From elevated surface Sit to Stand: Patient Percentage: 80% Stand to Sit: To chair/3-in-1;1: +2 Total assist Stand to Sit: Patient Percentage: 70% Stand Pivot Transfers: 1: +2 Total assist Stand Pivot Transfers: Patient Percentage: 70% Details for Transfer Assistance: +2 for st shift, safety and bil LE assist/pain control; verbal cues for safety and hand placement Ambulation/Gait Ambulation/Gait Assistance: 1: +2 Total assist Ambulation/Gait: Patient Percentage: 70% Ambulation Distance (Feet): 5 Feet Assistive device: Rolling walker Ambulation/Gait Assistance Details: verbal cues for sequence, RW position and use of UEs Gait Pattern: Step-to pattern General Gait Details: very painful with WBing RLE    Exercises  Total Joint Exercises Ankle Circles/Pumps: AROM;10 reps;Both Quad Sets: AROM;10 reps;Both Heel Slides: AAROM;Both;10 reps   PT Diagnosis: Difficulty walking  PT Problem List: Decreased strength;Decreased range of motion;Decreased activity tolerance;Decreased balance;Decreased mobility;Decreased knowledge of use of DME PT Treatment Interventions: Gait training;DME instruction;Stair training;Functional mobility training;Therapeutic activities;Therapeutic exercise;Patient/family education   PT Goals Acute Rehab PT Goals PT Goal Formulation: With patient Time For Goal Achievement: 12/17/12 Potential to Achieve Goals: Good Pt will go Supine/Side to Sit: with supervision PT Goal: Supine/Side to Sit - Progress: Goal set today Pt will go Sit to Supine/Side: with supervision PT Goal: Sit to Supine/Side - Progress: Goal set today Pt will go Sit to Stand: with supervision PT Goal: Sit to Stand - Progress: Goal set today Pt will go Stand to Sit: with supervision PT Goal: Stand to Sit - Progress: Goal set today Pt will Ambulate: 51 - 150 feet;with supervision;with rolling walker PT Goal: Ambulate - Progress: Goal set today Pt will Go Up / Down Stairs: 3-5 stairs;with min assist;with least restrictive assistive device PT Goal: Up/Down Stairs - Progress: Goal set today Pt will Perform Home Exercise Program: with supervision, verbal cues required/provided PT Goal: Perform Home Exercise Program - Progress: Goal set today  Visit Information  Last PT Received On: 12/13/12 Assistance Needed: +1    Subjective Data  Subjective: pt  Patient Stated Goal: wants to go home with family   Prior Functioning  Home Living Lives With: Spouse Type of Home: House Home Access: Stairs to enter Secretary/administrator of Steps: 4 Entrance Stairs-Rails: None Home Layout: Two level;1/2 bath on main level Alternate Level Stairs-Number of Steps: last time pt got a hospital bed for downstairs Additional Comments:  pt  through walker away RW and 3in1 due to damage Prior Function Level of Independence: Independent Able to Take Stairs?: Yes 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 Communication Communication: No difficulties    Cognition  Cognition Arousal/Alertness: Awake/alert Behavior During Therapy: WFL for tasks assessed/performed Overall Cognitive Status: Within Functional Limits for tasks assessed    Extremity/Trunk Assessment Right Lower Extremity Assessment RLE ROM/Strength/Tone: Deficits;Due to pain RLE ROM/Strength/Tone Deficits: difficulty with ankle flexion due to pain, AAROM knee grossly 15-45 degrees flexion; able  to assist with SLR Left Lower Extremity Assessment LLE ROM/Strength/Tone: Deficits;Due to pain LLE ROM/Strength/Tone Deficits: ankle WFL; knee AAROM grossly 7-55 degrees flexion; able to assist with SLR   Balance Static Standing Balance Static Standing - Balance Support: Bilateral upper extremity supported;During functional activity Static Standing - Level of Assistance: 4: Min assist  End of Session PT - End of Session Equipment Utilized During Treatment: Gait belt Activity Tolerance: Patient limited by pain;Patient limited by fatigue Patient left: in chair;with call bell/phone within reach;with family/visitor present Nurse Communication: Mobility status  GP     [facility] 12/13/2012, 11:01 AM

## 2012-12-13 NOTE — Progress Notes (Signed)
Received orders for rw and commode.  Will be delivered to room prior to d/c. °

## 2012-12-13 NOTE — Progress Notes (Signed)
Physical Therapy Treatment Patient Details Name: Destiny Dennis MRN: [id number] DOB: June 11, 1961 Today's Date: 12/13/2012 Time: 1308-6578 PT Time Calculation (min): 17 min  PT Assessment / Plan / Recommendation Comments on Treatment Session  Pt tries very hard but is having much difficulty sit to stand and R knee is more painful. Pt would like to get a hospital bed as all bedrooms on second level.    Follow Up Recommendations  Home health PT;Supervision for mobility/OOB     Does the patient have the potential to tolerate intense rehabilitation     Barriers to Discharge        Equipment Recommendations  Rolling walker with 5" wheels    Recommendations for Other Services    Frequency 7X/week   Plan Discharge plan remains appropriate;Frequency remains appropriate    Precautions / Restrictions Precautions Precautions: Knee   Pertinent Vitals/Pain R 7, L 2    Mobility  Bed Mobility Bed Mobility: Sit to Supine Sit to Supine: 4: Min assist Details for Bed Mobility Assistance: assist with RLe, able to place LLLe onto bed. Transfers Transfers: Sit to Stand;Stand to Sit;Stand Pivot Transfers Sit to Stand: 1: +2 Total assist;From elevated surface;From chair/3-in-1 Sit to Stand: Patient Percentage: 60% Stand to Sit: To chair/3-in-1;1: +2 Total assist Stand to Sit: Patient Percentage: 70% Stand Pivot Transfers: 1: +2 Total assist Details for Transfer Assistance: cues to push from armrests and walk legs back until able to get legs under and reach for RW. support for trunk stabilty to place hands on rw. Ambulation/Gait Ambulation/Gait Assistance: 1: +2 Total assist Ambulation/Gait: Patient Percentage: 70% Ambulation Distance (Feet): 5 Feet Assistive device: Rolling walker Ambulation/Gait Assistance Details: VC for sequence ,RW position. Gait Pattern: Step-to pattern General Gait Details: very painful with WBing RLE    Exercises     PT Diagnosis:    PT Problem List:   PT  Treatment Interventions:     PT Goals Acute Rehab PT Goals Pt will go Sit to Supine/Side: with supervision PT Goal: Sit to Supine/Side - Progress: Progressing toward goal Pt will go Sit to Stand: with supervision PT Goal: Sit to Stand - Progress: Progressing toward goal Pt will go Stand to Sit: with supervision PT Goal: Stand to Sit - Progress: Progressing toward goal Pt will Ambulate: 51 - 150 feet;with supervision;with rolling walker PT Goal: Ambulate - Progress: Progressing toward goal  Visit Information  Last PT Received On: 12/13/12 Assistance Needed: +2    Subjective Data  Subjective: I could not move my foot this AM it hurt so bad.   Cognition  Cognition Arousal/Alertness: Awake/alert    Balance     End of Session PT - End of Session Activity Tolerance: Patient limited by pain;Patient limited by fatigue Patient left: with call bell/phone within reach;with family/visitor present;in bed Nurse Communication: Mobility status   GP     Rada Hay 12/13/2012, 3:45 PM

## 2012-12-14 ENCOUNTER — Encounter (HOSPITAL_COMMUNITY): Payer: Self-pay | Admitting: Orthopedic Surgery

## 2012-12-14 LAB — BASIC METABOLIC PANEL
CO2: 31 mEq/L (ref 19–32)
GFR calc non Af Amer: >90 mL/min (ref >90)
Glucose, Bld: 119 mg/dL — ABNORMAL HIGH (ref 70–99)
Potassium: 3.6 mEq/L (ref 3.5–5.1)
Sodium: 139 mEq/L (ref 135–145)

## 2012-12-14 LAB — CBC
Hemoglobin: 8.1 g/dL — ABNORMAL LOW (ref 12.0–15.0)
RBC: 2.92 MIL/uL — ABNORMAL LOW (ref 3.87–5.11)
WBC: 7.7 10*3/uL (ref 4.0–10.5)

## 2012-12-14 MED ORDER — ACETAMINOPHEN 325 MG PO TABS
650.0000 mg | ORAL_TABLET | Freq: Four times a day (QID) | ORAL | Status: DC | PRN
  Administered 2012-12-14 – 2012-12-15 (×3): 650 mg via ORAL
  Filled 2012-12-14 (×3): qty 2

## 2012-12-14 MED ORDER — OXYCODONE HCL 5 MG PO TABS
5.0000 mg | ORAL_TABLET | ORAL | Status: DC
  Administered 2012-12-14 – 2012-12-15 (×8): 15 mg via ORAL
  Filled 2012-12-14 (×8): qty 3

## 2012-12-14 MED ORDER — KETOROLAC TROMETHAMINE 15 MG/ML IJ SOLN
15.0000 mg | Freq: Four times a day (QID) | INTRAMUSCULAR | Status: DC
  Administered 2012-12-14 – 2012-12-15 (×4): 15 mg via INTRAVENOUS
  Filled 2012-12-14 (×8): qty 1

## 2012-12-14 NOTE — Progress Notes (Signed)
Physical Therapy Treatment Patient Details Name: Destiny Dennis MRN: [id number] DOB: November 17, 1960 Today's Date: 12/14/2012 Time: 4098-1191 PT Time Calculation (min): 34 min  PT Assessment / Plan / Recommendation Comments on Treatment Session  pt progressing well today; motivated to go home    Follow Up Recommendations  Home health PT;Supervision for mobility/OOB     Does the patient have the potential to tolerate intense rehabilitation     Barriers to Discharge        Equipment Recommendations  Rolling walker with 5" wheels (3in1)    Recommendations for Other Services    Frequency 7X/week   Plan Discharge plan remains appropriate;Frequency remains appropriate    Precautions / Restrictions Precautions Precautions: Knee Restrictions RLE Weight Bearing: Weight bearing as tolerated LLE Weight Bearing: Weight bearing as tolerated   Pertinent Vitals/Pain Painful but tolerable per pt    Mobility  Bed Mobility Bed Mobility: Not assessed Supine to Sit: 4: Min assist;HOB elevated Details for Bed Mobility Assistance: assist for R LE Transfers Transfers: Sit to Stand;Stand to Sit;Stand Pivot Transfers Sit to Stand: 4: Min guard;5: Supervision;From chair/3-in-1;With upper extremity assist Stand to Sit: 4: Min guard;4: Min assist;To chair/3-in-1;With upper extremity assist Details for Transfer Assistance: verbal cues for hand placement and LE management Ambulation/Gait Ambulation/Gait Assistance: 4: Min guard Ambulation Distance (Feet): 120 Feet Assistive device: Rolling walker Ambulation/Gait Assistance Details: verbal cues for sequence, RW position and use of UEs Gait Pattern: Step-to pattern;Antalgic General Gait Details: pain better, able to WB both LEs this am    Exercises Total Joint Exercises Ankle Circles/Pumps: AROM;10 reps;Both Quad Sets: AROM;10 reps;Both Heel Slides: AAROM;Both;10 reps   PT Diagnosis:    PT Problem List:   PT Treatment Interventions:     PT  Goals Acute Rehab PT Goals Time For Goal Achievement: 12/17/12 Potential to Achieve Goals: Good Pt will go Sit to Stand: with supervision PT Goal: Sit to Stand - Progress: Progressing toward goal Pt will go Stand to Sit: with supervision PT Goal: Stand to Sit - Progress: Progressing toward goal Pt will Ambulate: 51 - 150 feet;with supervision;with rolling walker PT Goal: Ambulate - Progress: Progressing toward goal Pt will Perform Home Exercise Program: with supervision, verbal cues required/provided PT Goal: Perform Home Exercise Program - Progress: Progressing toward goal  Visit Information  Last PT Received On: 12/14/12 Assistance Needed: +2 (safety)    Subjective Data  Subjective: i have been trying to bend   Cognition  Cognition Arousal/Alertness: Awake/alert Behavior During Therapy: WFL for tasks assessed/performed Overall Cognitive Status: Within Functional Limits for tasks assessed    Balance  Balance Balance Assessed: Yes Dynamic Standing Balance Dynamic Standing - Level of Assistance: 4: Min assist  End of Session PT - End of Session Equipment Utilized During Treatment: Gait belt Activity Tolerance: Patient tolerated treatment well Patient left: in chair;with call bell/phone within reach Nurse Communication: Mobility status   GP     [facility] 12/14/2012, 11:19 AM

## 2012-12-14 NOTE — Progress Notes (Signed)
12/14/12 1500  PT Visit Information  Last PT Received On 12/14/12  Assistance Needed +1  PT Time Calculation  PT Start Time 1406  PT Stop Time 1436  PT Time Calculation (min) 30 min  Subjective Data  Subjective I just got some ice, but let's get it over with  Precautions  Precautions Knee  Restrictions  RLE Weight Bearing WBAT  LLE Weight Bearing WBAT  Cognition  Arousal/Alertness Awake/alert  Behavior During Therapy WFL for tasks assessed/performed  Overall Cognitive Status Within Functional Limits for tasks assessed  Bed Mobility  Bed Mobility Sit to Supine  Sit to Supine 4: Min assist;4: Min guard;HOB flat  Details for Bed Mobility Assistance min/guard for LEs  Transfers  Transfers Sit to Stand;Stand to Sit;Stand Pivot Transfers  Sit to Stand 4: Min guard;5: Supervision;From chair/3-in-1;With upper extremity assist  Stand to Sit 4: Min guard;4: Min assist;To chair/3-in-1;With upper extremity assist  Details for Transfer Assistance verbal cues for hand placement and LE management  Ambulation/Gait  Ambulation/Gait Assistance 5: Supervision;4: Min guard  Ambulation Distance (Feet) 130 Feet  Assistive device Rolling walker  Ambulation/Gait Assistance Details verbal cues for sequence/pain control  Gait Pattern Step-to pattern;Antalgic  Gait velocity decreased  Total Joint Exercises  Ankle Circles/Pumps AROM;10 reps;Both  PT - End of Session  Equipment Utilized During Treatment Gait belt  Activity Tolerance Patient tolerated treatment well  Patient left in bed;with call bell/phone within reach  PT - Assessment/Plan  Comments on Treatment Session progressing well; should be able to go home after therapy tomorrow; stairs in am  PT Plan Discharge plan remains appropriate;Frequency remains appropriate  PT Frequency 7X/week  Follow Up Recommendations Home health PT;Supervision for mobility/OOB  PT equipment Rolling walker with 5" wheels  Acute Rehab PT Goals  Time For Goal  Achievement 12/17/12  Potential to Achieve Goals Good  Pt will go Sit to Supine/Side with supervision  PT Goal: Sit to Supine/Side - Progress Progressing toward goal  Pt will go Sit to Stand with supervision  PT Goal: Sit to Stand - Progress Progressing toward goal  Pt will go Stand to Sit with supervision  PT Goal: Stand to Sit - Progress Progressing toward goal  Pt will Ambulate 51 - 150 feet;with supervision;with rolling walker  PT Goal: Ambulate - Progress Goal set today  Pt will Go Up / Down Stairs 3-5 stairs;with min assist;with least restrictive assistive device  PT General Charges  $$ ACUTE PT VISIT 1 Procedure  PT Treatments  $Gait Training 8-22 mins  $Therapeutic Activity 8-22 mins

## 2012-12-14 NOTE — Progress Notes (Signed)
   Subjective: 2 Days Post-Op Procedure(s) (LRB): RIGHT TOTAL KNEE ARTHROPLASTY (Right) LEFT KNEE EXCISION SAPHENOUS NEUROMA/OPEN SCAR DEBRIDEMENT/POLY EXCHANGE/NERVE EXCISION (Left)   Patient reports pain as mild, pain well controlled for about 2.5 hours and then the pain returns. No events throughout the night.  Objective:   VITALS:   Filed Vitals:   12/14/12 0654  BP: 105/49  Pulse: 70  Temp: 98.1 F (36.7 C)  Resp: 16    Neurovascular intact Dorsiflexion/Plantar flexion intact Incision: dressing C/D/I No cellulitis present Compartment soft  LABS  Recent Labs  12/13/12 0430 12/14/12 0408  HGB 9.9* 8.1*  HCT 29.8* 24.9*  WBC 9.6 7.7  PLT 299 216     Recent Labs  12/13/12 0430 12/14/12 0408  NA 135 139  K 3.6 3.6  BUN 10 10  CREATININE 0.70 0.66  GLUCOSE 124* 119*     Assessment/Plan: 2 Days Post-Op Procedure(s) (LRB): RIGHT TOTAL KNEE ARTHROPLASTY (Right) LEFT KNEE EXCISION SAPHENOUS NEUROMA/OPEN SCAR DEBRIDEMENT/POLY EXCHANGE/NERVE EXCISION (Left) Changed frequency of Oxycodone from 4 to 3 hours. Up with therapy Discharge home with home health eventually  Expected ABLA  Treated with iron and will observe  Obese (BMI 30-39.9) Estimated body mass index is 36.99 kg/(m^2) as calculated from the following:   Height as of this encounter: 5\' 11"  (1.803 m).   Weight as of this encounter: 120.26 kg (265 lb 2 oz). Patient also counseled that weight may inhibit the healing process Patient counseled that losing weight will help with future health issues      Anastasio Auerbach. Bridey Brookover   PAC  12/14/2012, 9:48 AM

## 2012-12-14 NOTE — Evaluation (Signed)
Occupational Therapy Evaluation Patient Details Name: Destiny Dennis MRN: [id number] DOB: 29-Oct-1960 Today's Date: 12/14/2012 Time: 4098-1191 OT Time Calculation (min): 39 min  OT Assessment / Plan / Recommendation Clinical Impression    pt is s/p R TKA and poly exchange L knee wtih scar excision. She displays some pain, decreased functional mobility and ADL. Will benefit from skilled OT to increase independence with self care tasks.     OT Assessment  Patient needs continued OT Services    Follow Up Recommendations  No OT follow up;Supervision/Assistance - 24 hour    Barriers to Discharge      Equipment Recommendations  3 in 1 bedside comode    Recommendations for Other Services    Frequency  Min 2X/week    Precautions / Restrictions Precautions Precautions: Knee Restrictions RLE Weight Bearing: Weight bearing as tolerated LLE Weight Bearing: Weight bearing as tolerated        ADL  Eating/Feeding: Simulated;Independent Where Assessed - Eating/Feeding: Bed level Grooming: Performed;Wash/dry hands;Teeth care;Minimal assistance Where Assessed - Grooming: Unsupported standing Upper Body Bathing: Simulated;Chest;Right arm;Left arm;Abdomen;Set up Where Assessed - Upper Body Bathing: Unsupported sitting Lower Body Bathing: Simulated;Moderate assistance Where Assessed - Lower Body Bathing: Supported sit to stand Upper Body Dressing: Simulated;Set up Where Assessed - Upper Body Dressing: Unsupported sitting Lower Body Dressing: Simulated;Maximal assistance Where Assessed - Lower Body Dressing: Supported sit to stand Toilet Transfer: Performed;Moderate assistance;Minimal assistance Toilet Transfer Method: Other (comment) (with RW into bathroom to 3in1) Toilet Transfer Equipment: Raised toilet seat with arms (or 3-in-1 over toilet) Toileting - Clothing Manipulation and Hygiene: Performed;Minimal assistance Where Assessed - Toileting Clothing Manipulation and Hygiene: Sit to  stand from 3-in-1 or toilet Equipment Used: Long-handled shoe horn;Long-handled sponge;Reacher;Rolling walker;Sock aid ADL Comments: Demonstrated all AE and pt states family can help. She will think about the AE. Pt has difficulty with sit to stand but once up, does well with functional transfers to bathroom. She needs cues for LE management and hand placement.     OT Diagnosis: Generalized weakness  OT Problem List: Decreased strength;Decreased knowledge of use of DME or AE OT Treatment Interventions: Self-care/ADL training;DME and/or AE instruction;Therapeutic activities;Patient/family education   OT Goals Acute Rehab OT Goals OT Goal Formulation: With patient Time For Goal Achievement: 12/21/12 Potential to Achieve Goals: Good ADL Goals Pt Will Perform Grooming: with supervision;Standing at sink ADL Goal: Grooming - Progress: Goal set today Pt Will Transfer to Toilet: with supervision;Ambulation;3-in-1;with DME ADL Goal: Toilet Transfer - Progress: Goal set today Pt Will Perform Toileting - Clothing Manipulation: with supervision;Standing ADL Goal: Toileting - Clothing Manipulation - Progress: Goal set today  Visit Information  Last OT Received On: 12/14/12 Assistance Needed: +1    Subjective Data  Subjective: I can brush my teeth while I am in the bathroom Patient Stated Goal: brush teeth   Prior Functioning     Home Living Lives With: Spouse Type of Home: House Home Access: Stairs to enter Secretary/administrator of Steps: 4 Entrance Stairs-Rails: None Home Layout: Two level;1/2 bath on main level Alternate Level Stairs-Number of Steps: last time pt got a hospital bed for downstairs Bathroom Shower/Tub:  (only half bath downstairs) Bathroom Toilet: Standard Additional Comments: pt through walker away RW and 3in1 due to damage Prior Function Level of Independence: Independent Able to Take Stairs?: Yes Vocation: Full time employment Communication Communication: No  difficulties         Vision/Perception     Cognition  Cognition Arousal/Alertness: Awake/alert Behavior During  Therapy: WFL for tasks assessed/performed Overall Cognitive Status: Within Functional Limits for tasks assessed    Extremity/Trunk Assessment Right Upper Extremity Assessment RUE ROM/Strength/Tone: WFL for tasks assessed Left Upper Extremity Assessment LUE ROM/Strength/Tone: WFL for tasks assessed     Mobility Bed Mobility Bed Mobility: Supine to Sit Supine to Sit: 4: Min assist;HOB elevated Details for Bed Mobility Assistance: assist for R LE Transfers Transfers: Sit to Stand;Stand to Sit Sit to Stand: 4: Min assist;3: Mod assist;With upper extremity assist;From bed;From chair/3-in-1 Stand to Sit: 4: Min assist;With upper extremity assist;To chair/3-in-1 Details for Transfer Assistance: verbal cues for hand placement and LE management     Exercise     Balance Balance Balance Assessed: Yes Dynamic Standing Balance Dynamic Standing - Level of Assistance: 4: Min assist   End of Session OT - End of Session Equipment Utilized During Treatment: Gait belt Activity Tolerance: Patient tolerated treatment well Patient left: in chair;with call bell/phone within reach  GO     Lennox Laity [phone] 12/14/2012, 10:49 AM

## 2012-12-15 MED ORDER — DSS 100 MG PO CAPS: 100.0000 mg | ORAL_CAPSULE | Freq: Two times a day (BID) | ORAL | Status: DC

## 2012-12-15 MED ORDER — FERROUS SULFATE 325 (65 FE) MG PO TABS: 325.0000 mg | ORAL_TABLET | Freq: Three times a day (TID) | ORAL | Status: DC

## 2012-12-15 MED ORDER — POLYETHYLENE GLYCOL 3350 17 G PO PACK: 17.0000 g | PACK | Freq: Two times a day (BID) | ORAL | Status: DC

## 2012-12-15 MED ORDER — TIZANIDINE HCL 4 MG PO CAPS: 4.0000 mg | ORAL_CAPSULE | Freq: Three times a day (TID) | ORAL | Status: DC

## 2012-12-15 MED ORDER — OXYCODONE HCL 5 MG PO TABS: 5.0000 mg | ORAL_TABLET | ORAL | Status: DC | PRN

## 2012-12-15 NOTE — Progress Notes (Signed)
Physical Therapy Treatment Patient Details Name: Destiny Dennis MRN: [id number] DOB: 1961/05/23 Today's Date: 12/15/2012 Time: 4098-1191 PT Time Calculation (min): 37 min  PT Assessment / Plan / Recommendation Comments on Treatment Session  pt not feeling well this am; c/o being very hot (stopped hormone patchj prior to surgery and pt feels may be due to this); temp of 99.0; ice to knees and temp adjusted in room for pt domfort; will practice stairs in pm; pt has 4 steps to get into house and bedrooms on second level (15steps); pt will not be able to go up 15steps initially and she requests hospital bed for the interim; case manager aware    Follow Up Recommendations  Home health PT;Supervision for mobility/OOB     Does the patient have the potential to tolerate intense rehabilitation     Barriers to Discharge        Equipment Recommendations  Rolling walker with 5" wheels    Recommendations for Other Services    Frequency 7X/week   Plan Discharge plan remains appropriate;Frequency remains appropriate    Precautions / Restrictions Precautions Precautions: Knee Restrictions Weight Bearing Restrictions: No RLE Weight Bearing: Weight bearing as tolerated LLE Weight Bearing: Weight bearing as tolerated   Pertinent Vitals/Pain 3/10 knees    Mobility  Bed Mobility Bed Mobility: Sit to Supine Supine to Sit: 5: Supervision;HOB elevated (30*) Transfers Transfers: Sit to Stand;Stand to Sit;Stand Pivot Transfers Sit to Stand: 5: Supervision;From bed Stand to Sit: 5: Supervision;To chair/3-in-1;With upper extremity assist Details for Transfer Assistance: verbal cues for hand placement and LE management Ambulation/Gait Ambulation/Gait Assistance: 5: Supervision Ambulation Distance (Feet): 130 Feet Assistive device: Rolling walker Ambulation/Gait Assistance Details: cues for step through Gait Pattern: Step-to pattern;Step-through pattern General Gait Details: more fluid gait this  am Stairs: No    Exercises Total Joint Exercises Ankle Circles/Pumps: AROM;10 reps;Both Long Arc Quad: AROM;10 reps;Both;Seated Knee Flexion: AROM;AAROM;Both;10 reps;Seated Goniometric ROM: Left 5-80* flexion; R 7-86* flexion   PT Diagnosis:    PT Problem List:   PT Treatment Interventions:     PT Goals Acute Rehab PT Goals Time For Goal Achievement: 12/17/12 Potential to Achieve Goals: Good Pt will go Supine/Side to Sit: with modified independence PT Goal: Supine/Side to Sit - Progress: Updated due to goal met Pt will go Sit to Stand: with modified independence PT Goal: Sit to Stand - Progress: Updated due to goal met Pt will go Stand to Sit: with modified independence PT Goal: Stand to Sit - Progress: Updated due to goals met Pt will Ambulate: 51 - 150 feet;with supervision;with rolling walker PT Goal: Ambulate - Progress: Progressing toward goal Pt will Go Up / Down Stairs: 3-5 stairs;with min assist;with least restrictive assistive device  Visit Information  Last PT Received On: 12/15/12 Assistance Needed: +1    Subjective Data  Subjective: I am burning up   Cognition  Cognition Arousal/Alertness: Awake/alert Behavior During Therapy: WFL for tasks assessed/performed Overall Cognitive Status: Within Functional Limits for tasks assessed    Balance     End of Session PT - End of Session Equipment Utilized During Treatment: Gait belt Activity Tolerance: Patient tolerated treatment well Patient left: in chair;with call bell/phone within reach   GP     [facility] 12/15/2012, 10:56 AM

## 2012-12-15 NOTE — Progress Notes (Signed)
Patient woke up and walked with assistance to restroom.  Upon returning to bed patient reported palpitations.  Blood pressure WNL, heart rate tachy and irregular.  PRN lopressor given as ordered, patient requested to take 25mg  now and 25mg  in one hour if the palpitations did not resolve.  Will continue to monitor.

## 2012-12-15 NOTE — Progress Notes (Signed)
12/15/12 1500  PT Visit Information  Last PT Received On 12/15/12  Assistance Needed +1  PT Time Calculation  PT Start Time 1334  PT Stop Time 1420  PT Time Calculation (min) 46 min  Subjective Data  Subjective i cooled down  Patient Stated Goal home maybe today  Precautions  Precautions Knee  Restrictions  Weight Bearing Restrictions No  RLE Weight Bearing WBAT  LLE Weight Bearing WBAT  Cognition  Arousal/Alertness Awake/alert  Behavior During Therapy WFL for tasks assessed/performed  Overall Cognitive Status Within Functional Limits for tasks assessed  Transfers  Transfers Sit to Stand;Stand to Sit  Sit to Stand 5: Supervision;6: Modified independent (Device/Increase time)  Stand to Sit 5: Supervision;6: Modified independent (Device/Increase time)  Ambulation/Gait  Ambulation/Gait Assistance 5: Supervision;6: Modified independent (Device/Increase time)  Ambulation Distance (Feet) 80 Feet  Assistive device Rolling walker  Gait Pattern Step-to pattern;Step-through pattern  Stairs Yes  Stairs Assistance 4: Min guard;4: Min assist  Stairs Assistance Details (indicate cue type and reason) cues for sequence  Stair Management Technique Step to pattern;With crutches  Number of Stairs 5  Total Joint Exercises  Ankle Circles/Pumps AROM;10 reps;Both  Quad Sets AROM;10 reps;Both  Heel Slides AAROM;Both;10 reps  Straight Leg Raises AROM;AAROM;Both;10 reps  Long Arc Quad AROM;10 reps;Both;Seated  PT - End of Session  Equipment Utilized During Treatment Gait belt  Activity Tolerance Patient tolerated treatment well  Patient left in chair;with call bell/phone within reach;with family/visitor present  Nurse Communication Other (comment) (pt ok for D/C fromPT standpoint)  PT - Assessment/Plan  Comments on Treatment Session pt feeling much better this pm; did well wtih steps; husband present for session  PT Plan Discharge plan remains appropriate;Frequency remains appropriate  PT  Frequency 7X/week  Follow Up Recommendations Home health PT;Supervision for mobility/OOB  PT equipment Rolling walker with 5" wheels (wants hospital bed)  Acute Rehab PT Goals  Time For Goal Achievement 12/17/12  Potential to Achieve Goals Good  Pt will go Sit to Stand with modified independence  PT Goal: Sit to Stand - Progress Met  Pt will go Stand to Sit with modified independence  PT Goal: Stand to Sit - Progress Met  Pt will Ambulate 51 - 150 feet;with supervision;with rolling walker  PT Goal: Ambulate - Progress Met  Pt will Go Up / Down Stairs 3-5 stairs;with min assist;with least restrictive assistive device  PT Goal: Up/Down Stairs - Progress Met  Pt will Perform Home Exercise Program with supervision, verbal cues required/provided  PT Goal: Perform Home Exercise Program - Progress Met  PT General Charges  $$ ACUTE PT VISIT 1 Procedure  PT Treatments  $Gait Training 23-37 mins  $Therapeutic Exercise 8-22 mins

## 2012-12-15 NOTE — Progress Notes (Signed)
1 hour following PRN lopressor patient reported improved but not resolved palpitations.  Patient requested to take the other half of the ordered lopressor.  Vitals taken again.  Will continue to monitor.

## 2012-12-15 NOTE — Progress Notes (Signed)
   Subjective: 3 Days Post-Op Procedure(s) (LRB): RIGHT TOTAL KNEE ARTHROPLASTY (Right) LEFT KNEE EXCISION SAPHENOUS NEUROMA/OPEN SCAR DEBRIDEMENT/POLY EXCHANGE/NERVE EXCISION (Left)   Patient reports pain as moderate, pain controlled with medication. No events throughout the night.   Objective:   VITALS:   Filed Vitals:   12/15/12 0900  BP: 124/76  Pulse: 94  Temp: 98.6 F (37 C)  Resp: 18    Neurovascular intact Dorsiflexion/Plantar flexion intact Incision: dressing C/D/I No cellulitis present Compartment soft  LABS  Recent Labs  12/13/12 0430 12/14/12 0408  HGB 9.9* 8.1*  HCT 29.8* 24.9*  WBC 9.6 7.7  PLT 299 216     Recent Labs  12/13/12 0430 12/14/12 0408  NA 135 139  K 3.6 3.6  BUN 10 10  CREATININE 0.70 0.66  GLUCOSE 124* 119*     Assessment/Plan: 3 Days Post-Op Procedure(s) (LRB): RIGHT TOTAL KNEE ARTHROPLASTY (Right) LEFT KNEE EXCISION SAPHENOUS NEUROMA/OPEN SCAR DEBRIDEMENT/POLY EXCHANGE/NERVE EXCISION (Left) Up with therapy Discharge home with home health Follow up in 2 weeks at [facility]. Follow up with OLIN,Dwanna Goshert D in 2 weeks.  Contact information:  [facility] [address] [place] [address] [phone]      Obese (BMI 30-39.9)  Estimated body mass index is 36.99 kg/(m^2) as calculated from the following:      Height as of this encounter: 5\' 11"  (1.803 m).      Weight as of this encounter: 120.26 kg (265 lb 2 oz).  Patient also counseled that weight may inhibit the healing process  Patient counseled that losing weight will help with future health issues      Anastasio Auerbach. Farran Amsden   PAC  12/15/2012, 12:26 PM

## 2012-12-16 NOTE — Discharge Summary (Signed)
Physician Discharge Summary  Patient ID: Destiny Dennis MRN: [id number] DOB/AGE: 1961-05-18 52 y.o.  Admit date: 12/12/2012 Discharge date: 12/15/2012   Procedures:  Procedure(s) (LRB): RIGHT TOTAL KNEE ARTHROPLASTY (Right) LEFT KNEE EXCISION SAPHENOUS NEUROMA/OPEN SCAR DEBRIDEMENT/POLY EXCHANGE/NERVE EXCISION (Left)  Attending Physician:  Dr. Durene Romans   Admission Diagnoses:   Bilaterally knee pain. Right knee OA / pain. Left knee pain, patella cluck and painful saphenous neuroma.  Discharge Diagnoses:  Principal Problem:   S/P right TKA Active Problems:   S/P left knee revision   Expected blood loss anemia   Obese  Past Medical History  Diagnosis Date  . Atrial fibrillation 03/05/2007    a. s/p RFCA 12/18/2011  . 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 GERD 12/23/2009  . Sialoadenitis 03/18/2010  . PVC (premature ventricular contraction)   . History of MRSA infection 2008  . Allergic rhinitis   . Cervical disc disease   . Benign fundic gland polyps of stomach   . Hiatal hernia   . Diverticulitis     CT Scan   . Hemorrhoids   . Atrial flutter     a. s/p RFCA 12/18/2011  . Complication of anesthesia   . PONV (postoperative nausea and vomiting)   . Headache   . Arthritis   . Family history of anesthesia complication     " My Uncle " unsure of type of problem    HPI:   Destiny Dennis, 52 y.o. female, has a history of pain and functional disability in the right knee due to arthritis and has failed non-surgical conservative treatments for greater than 12 weeks to include NSAID's and/or analgesics and activity modification. Onset of symptoms was gradual, starting 5 years ago with gradually worsening course since that time. The patient noted prior procedures on the knee to include arthroplasty on the left knee(s). Patient currently rates pain in the right knee(s) at 9 out of 10 with activity and pain in the left knee at 8 out of 10 with activity. Patient has night  pain, worsening of pain with activity and weight bearing, pain that interferes with activities of daily living, pain with passive range of motion, crepitus and joint swelling. Patient has evidence of periarticular osteophytes and joint space narrowing by imaging studies. There is no active infection. Risks, benefits and expectations were discussed with the patient. Patient understand the risks, benefits and expectations and wishes to proceed with surgery.  PCP: Judie Petit, MD   Discharged Condition: good  Hospital Course:  Patient underwent the above stated procedure on 12/12/2012. Patient tolerated the procedure well and brought to the recovery room in good condition and subsequently to the floor.  POD #1 BP: 108/69 ; Pulse: 65 ; Temp: 97.5 F (36.4 C) ; Resp: 16 Pt's foley was removed, as well as the hemovac drain removed. IV was changed to a saline lock. Patient reports pain as moderate. Noticed that she felt a little better after this surgery than her index left knee surgery Neurovascular intact, dorsiflexion/plantar flexion intact, incision: dressing C/D/I, no cellulitis present and compartment soft.   LABS  Basename  12/13/12    0430   HGB  9.9  HCT  29.8   POD #2  BP: 105/49 ; Pulse: 70 ; Temp: 98.1 F (36.7 C) ; Resp: 16  Patient reports pain as mild, pain well controlled for about 2.5 hours and then the pain returns. No events throughout the night.  Pain medication duration changed. Neurovascular intact, dorsiflexion/plantar  flexion intact, incision: dressing C/D/I, no cellulitis present and compartment soft.   LABS  Basename  12/14/12     0408   HGB  8.1  HCT  24.9   POD #3  BP: 124/76 ; Pulse: 94 ; Temp: 98.6 F (37 C) ; Resp: 18  Patient reports pain as moderate, pain controlled with medication. No events throughout the night.  Neurovascular intact, dorsiflexion/plantar flexion intact, incision: dressing C/D/I, no cellulitis present and compartment soft.   LABS    No new labs  Discharge Exam: General appearance: alert, cooperative and no distress Extremities: Homans sign is negative, no sign of DVT, no edema, redness or tenderness in the calves or thighs and no ulcers, gangrene or trophic changes  Disposition:   Home-Health Care Svc with follow up in 2 weeks   Follow-up Information   Follow up with Shelda Pal, MD. Schedule an appointment as soon as possible for a visit in 2 weeks.   Contact information:   [address] [place] [address] [phone]       Discharge Orders   Future Appointments Provider Department Dept Phone   02/06/2013 10:15 AM Hillis Range, MD  [facility] Main Office [place]) [phone]   Future Orders Complete By Expires     Call MD / Call 911  As directed     Comments:      If you experience chest pain or shortness of breath, CALL 911 and be transported to the hospital emergency room.  If you develope a fever above 101 F, pus (white drainage) or increased drainage or redness at the wound, or calf pain, call your surgeon's office.    Change dressing  As directed     Comments:      Maintain surgical dressing for 10-14 days, then change the dressing daily with sterile 4 x 4 inch gauze dressing and tape. Keep the area dry and clean.    Constipation Prevention  As directed     Comments:      Drink plenty of fluids.  Prune juice may be helpful.  You may use a stool softener, such as Colace (over the counter) 100 mg twice a day.  Use MiraLax (over the counter) for constipation as needed.    Diet - low sodium heart healthy  As directed     Discharge instructions  As directed     Comments:      Maintain surgical dressing for 10-14 days, then replace with gauze and tape. Keep the area dry and clean until follow up. Follow up in 2 weeks at [facility]. Call with any questions or concerns.    Driving restrictions  As directed     Comments:      No driving for 4 weeks    Increase  activity slowly as tolerated  As directed     TED hose  As directed     Comments:      Use stockings (TED hose) for 2 weeks on both leg(s).  You may remove them at night for sleeping.    Weight bearing as tolerated  As directed          Medication List    STOP taking these medications       acetaminophen 500 MG tablet  Commonly known as:  TYLENOL     HYDROcodone-acetaminophen 5-325 MG per tablet  Commonly known as:  NORCO/VICODIN      TAKE these medications       CALCIUM-VITAMIN D PO  Take 1 tablet by mouth daily.     diazepam 5 MG tablet  Commonly known as:  VALIUM  Take 5 mg by mouth at bedtime.     DSS 100 MG Caps  Take 100 mg by mouth 2 (two) times daily.     estradiol 0.1 MG/24HR  Commonly known as:  VIVELLE-DOT  Place 1 patch onto the skin 2 (two) times a week. Monday AND Friday     ferrous sulfate 325 (65 FE) MG tablet  Take 1 tablet (325 mg total) by mouth 3 (three) times daily after meals.     fish oil-omega-3 fatty acids 1000 MG capsule  Take 2 g by mouth daily.     metoprolol succinate 100 MG 24 hr tablet  Commonly known as:  TOPROL-XL  Take one by mouth each morning     metoprolol tartrate 25 MG tablet  Commonly known as:  LOPRESSOR  Take 1-2 tablets every 6 hours as needed     multivitamin with minerals Tabs  Take 1 tablet by mouth daily.     oxyCODONE 5 MG immediate release tablet  Commonly known as:  Oxy IR/ROXICODONE  Take 1-3 tablets (5-15 mg total) by mouth every 3 (three) hours as needed for pain.     pantoprazole 40 MG tablet  Commonly known as:  PROTONIX  TAKE 1 TABLET BY MOUTH EVERY DAY     polyethylene glycol packet  Commonly known as:  MIRALAX / GLYCOLAX  Take 17 g by mouth 2 (two) times daily.     progesterone 100 MG capsule  Commonly known as:  PROMETRIUM  Take 100 mg by mouth at bedtime.     Rivaroxaban 20 MG Tabs  Commonly known as:  XARELTO  Take 1 tablet (20 mg total) by mouth at bedtime.     tiZANidine 4 MG  capsule  Commonly known as:  ZANAFLEX  Take 1 capsule (4 mg total) by mouth 3 (three) times daily. Muscle spasms     VITAMIN D PO  Take 1 tablet by mouth daily.         Signed: Anastasio Auerbach. Truth Barot   PAC  12/16/2012, 8:52 AM

## 2013-02-01 ENCOUNTER — Encounter: Payer: Self-pay | Admitting: Family

## 2013-02-01 ENCOUNTER — Ambulatory Visit (INDEPENDENT_AMBULATORY_CARE_PROVIDER_SITE_OTHER): Payer: Managed Care, Other (non HMO) | Admitting: Family

## 2013-02-01 ENCOUNTER — Telehealth: Payer: Self-pay | Admitting: Internal Medicine

## 2013-02-01 VITALS — BP 144/92 | HR 71 | Wt 250.0 lb

## 2013-02-01 DIAGNOSIS — R05 Cough: Secondary | ICD-10-CM

## 2013-02-01 DIAGNOSIS — J329 Chronic sinusitis, unspecified: Secondary | ICD-10-CM

## 2013-02-01 DIAGNOSIS — J309 Allergic rhinitis, unspecified: Secondary | ICD-10-CM

## 2013-02-01 DIAGNOSIS — R0982 Postnasal drip: Secondary | ICD-10-CM

## 2013-02-01 MED ORDER — METHYLPREDNISOLONE 4 MG PO KIT: PACK | ORAL | Status: DC

## 2013-02-01 NOTE — Progress Notes (Signed)
Subjective:    Patient ID: Destiny Dennis, female    DOB: Mar 18, 1961, 52 y.o.   MRN: [id number]  HPI  Pt is a 52 year old white female who presents to PCP with persistent non productive cough x 8 days. Also reports accompanying allergy symptoms of post nasal drip and watery eyes. States allergy symptoms are being managed by OTC Claritin and saline nasal rinse.  Review of Systems  Constitutional: Negative.   HENT: Negative.   Eyes: Negative.   Respiratory: Positive for cough.   Cardiovascular: Negative.   Gastrointestinal: Negative.   Endocrine: Negative.   Genitourinary: Negative.   Musculoskeletal: Negative.   Skin: Negative.   Allergic/Immunologic: Negative.   Neurological: Negative.   Hematological: Negative.   Psychiatric/Behavioral: Negative.    Past Medical History  Diagnosis Date  . Atrial fibrillation 03/05/2007    a. s/p RFCA 12/18/2011  . 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 GERD 12/23/2009  . Sialoadenitis 03/18/2010  . PVC (premature ventricular contraction)   . History of MRSA infection 2008  . Allergic rhinitis   . Cervical disc disease   . Benign fundic gland polyps of stomach   . Hiatal hernia   . Diverticulitis     CT Scan   . Hemorrhoids   . Atrial flutter     a. s/p RFCA 12/18/2011  . Complication of anesthesia   . PONV (postoperative nausea and vomiting)   . Headache(784.0)   . Arthritis   . Family history of anesthesia complication     " My Uncle " unsure of type of problem    History   Social History  . Marital Status: Married    Spouse Name: N/A    Number of Children: 2  . Years of Education: N/A   Occupational History  . [employer]     Workers Comp   Social History Main Topics  . Smoking status: Never Smoker   . Smokeless tobacco: Never Used  . Alcohol Use: No  . Drug Use: No  . Sexually Active: Not on file     Comment: has periods q 3-4 months- no possiblity pregnant per pt.07-16-11   Other Topics Concern  . Not  on file   Social History Narrative   Daily Caffeine    Pt lives in [place] with spouse.  She works as a workers Runner, broadcasting/film/video.    Past Surgical History  Procedure Laterality Date  . Knee surgery      x 9 Left Knee  . Upper gastrointestinal endoscopy  01/30/2010    hiatal hernia, fundic gland polyps  . Shoulder surgery      Right   . Elbow surgery      Right  . Foot surgery      Right   . Cervical fusion    . Cesarean section      x 2  . Wrist surgery      Left   . Cholecystectomy    . Flexible sigmoidoscopy      1990's  . Colonoscopy  07/16/2011    diverticulosis  . Tee without cardioversion  12/17/2011    Procedure: TRANSESOPHAGEAL ECHOCARDIOGRAM (TEE);  Surgeon: Laurey Morale, MD;  Location: [facility] ENDOSCOPY;  Service: Cardiovascular;  Laterality: N/A;  . Atrial fibrillation and atrial flutter ablation  12/17/11    PVI and CTI ablations by Dr Johney Frame  . Tee without cardioversion  07/28/2012    Procedure: TRANSESOPHAGEAL ECHOCARDIOGRAM (TEE);  Surgeon: Vesta Mixer, MD;  Location: [facility] ENDOSCOPY;  Service: Cardiovascular;  Laterality: N/A;  . Atrial fibrillation ablation  07/29/2012    PVI by Dr Johney Frame (second procedure)  . Total knee arthroplasty Right 12/12/2012    Procedure: RIGHT TOTAL KNEE ARTHROPLASTY;  Surgeon: Shelda Pal, MD;  Location: [facility] ORS;  Service: Orthopedics;  Laterality: Right;  . I&d knee with poly exchange Left 12/12/2012    Procedure: LEFT KNEE EXCISION SAPHENOUS NEUROMA/OPEN SCAR DEBRIDEMENT/POLY EXCHANGE/NERVE EXCISION;  Surgeon: Shelda Pal, MD;  Location: [facility] ORS;  Service: Orthopedics;  Laterality: Left;    Family History  Problem Relation Age of Onset  . Diabetes Maternal Grandfather   . Colon cancer Neg Hx   . Diverticulitis Brother     x 2  . Diverticulitis Paternal Grandmother     Allergies  Allergen Reactions  . Avelox (Moxifloxacin Hcl In Nacl) Other (See Comments)    Numbness & tingling Peripheral neuropathy    Current  Outpatient Prescriptions on File Prior to Visit  Medication Sig Dispense Refill  . CALCIUM-VITAMIN D PO Take 1 tablet by mouth daily.      . Cholecalciferol (VITAMIN D PO) Take 1 tablet by mouth daily.      . diazepam (VALIUM) 5 MG tablet Take 5 mg by mouth at bedtime.      . docusate sodium 100 MG CAPS Take 100 mg by mouth 2 (two) times daily.  10 capsule    . estradiol (VIVELLE-DOT) 0.1 MG/24HR Place 1 patch onto the skin 2 (two) times a week. Monday AND Friday      . ferrous sulfate 325 (65 FE) MG tablet Take 1 tablet (325 mg total) by mouth 3 (three) times daily after meals.      . fish oil-omega-3 fatty acids 1000 MG capsule Take 2 g by mouth daily.      . metoprolol (TOPROL-XL) 100 MG 24 hr tablet Take one by mouth each morning  30 tablet  6  . metoprolol tartrate (LOPRESSOR) 25 MG tablet Take 1-2 tablets every 6 hours as needed  30 tablet  1  . Multiple Vitamin (MULITIVITAMIN WITH MINERALS) TABS Take 1 tablet by mouth daily.      Marland Kitchen oxyCODONE (OXY IR/ROXICODONE) 5 MG immediate release tablet Take 1-3 tablets (5-15 mg total) by mouth every 3 (three) hours as needed for pain.  120 tablet  0  . pantoprazole (PROTONIX) 40 MG tablet TAKE 1 TABLET BY MOUTH EVERY DAY  30 tablet  5  . progesterone (PROMETRIUM) 100 MG capsule Take 100 mg by mouth at bedtime.      . Rivaroxaban (XARELTO) 20 MG TABS Take 1 tablet (20 mg total) by mouth at bedtime.  30 tablet  3  . [DISCONTINUED] flecainide (TAMBOCOR) 100 MG tablet Take 1 tablet (100 mg total) by mouth 2 (two) times daily.  60 tablet  12   No current facility-administered medications on file prior to visit.    BP 144/92  Pulse 71  Wt 250 lb (113.399 kg)  BMI 34.88 kg/m2  SpO2 98%  LMP 09/01/2013chart    Objective:   Physical Exam  Constitutional: She is oriented to person, place, and time. She appears well-developed.  HENT:  Head: Normocephalic and atraumatic.  Eyes: Pupils are equal, round, and reactive to light.  Neck: Normal range of  motion.  Cardiovascular: Normal rate, regular rhythm and normal heart sounds.   Pulmonary/Chest: Effort normal and breath sounds normal.  Persistent non productive cough  Abdominal: Soft. Bowel sounds are  normal.  Musculoskeletal: Normal range of motion.  Neurological: She is alert and oriented to person, place, and time.  Skin: Skin is warm and dry.          Assessment & Plan:  1. Upper respiratory infection 2. Post nasal drip 3. Allergic Rhinitis  Pt encouraged to continue OTC allergy treatment since they appear to remedy symptoms. Placed on Medrol pack to relieve persistent cough. Pt instructed to return to PCP if symptoms persist or worsen.

## 2013-02-01 NOTE — Telephone Encounter (Signed)
Ok if Dr. Cato Mulligan approves

## 2013-02-01 NOTE — Telephone Encounter (Signed)
Ok per Dr Swords 

## 2013-02-01 NOTE — Telephone Encounter (Signed)
Pt would to switch to NP. Can I SCH?

## 2013-02-03 NOTE — Telephone Encounter (Signed)
lmom for pt to call back

## 2013-02-06 ENCOUNTER — Encounter: Payer: Self-pay | Admitting: Internal Medicine

## 2013-02-06 ENCOUNTER — Ambulatory Visit (INDEPENDENT_AMBULATORY_CARE_PROVIDER_SITE_OTHER): Payer: Managed Care, Other (non HMO) | Admitting: Internal Medicine

## 2013-02-06 VITALS — BP 131/65 | HR 66 | Ht 71.0 in | Wt 246.8 lb

## 2013-02-06 DIAGNOSIS — I4891 Unspecified atrial fibrillation: Secondary | ICD-10-CM

## 2013-02-06 MED ORDER — RIVAROXABAN 20 MG PO TABS: 20.0000 mg | ORAL_TABLET | Freq: Every day | ORAL | Status: DC

## 2013-02-06 MED ORDER — DRONEDARONE HCL 400 MG PO TABS: 400.0000 mg | ORAL_TABLET | Freq: Two times a day (BID) | ORAL | Status: DC

## 2013-02-06 NOTE — Progress Notes (Signed)
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  She recently underwent knee surgery.  Since that time, her afib has returned and increased in frequency. Today, she denies symptoms of chest pain, shortness of breath,  lower extremity edema, dizziness, presyncope, or syncope.  The patient is otherwise without complaint today.   Past Medical History  Diagnosis Date  . Atrial fibrillation 03/05/2007    a. s/p RFCA 12/18/2011  . 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 GERD 12/23/2009  . Sialoadenitis 03/18/2010  . PVC (premature ventricular contraction)   . History of MRSA infection 2008  . Allergic rhinitis   . Cervical disc disease   . Benign fundic gland polyps of stomach   . Hiatal hernia   . Diverticulitis     CT Scan   . Hemorrhoids   . Atrial flutter     a. s/p RFCA 12/18/2011  . Complication of anesthesia   . PONV (postoperative nausea and vomiting)   . Headache(784.0)   . Arthritis   . Family history of anesthesia complication     " My Uncle " unsure of type of problem   Past Surgical History  Procedure Laterality Date  . Knee surgery      x 9 Left Knee  . Upper gastrointestinal endoscopy  01/30/2010    hiatal hernia, fundic gland polyps  . Shoulder surgery      Right   . Elbow surgery      Right  . Foot surgery      Right   . Cervical fusion    . Cesarean section      x 2  . Wrist surgery      Left   . Cholecystectomy    . Flexible sigmoidoscopy      1990's  . Colonoscopy  07/16/2011    diverticulosis  . Tee without cardioversion  12/17/2011    Procedure: TRANSESOPHAGEAL ECHOCARDIOGRAM (TEE);  Surgeon: Laurey Morale, MD;  Location: [facility] ENDOSCOPY;  Service: Cardiovascular;  Laterality: N/A;  . Atrial fibrillation and atrial flutter ablation  12/17/11    PVI and CTI ablations by Dr Johney Frame  . Tee without cardioversion  07/28/2012    Procedure:  TRANSESOPHAGEAL ECHOCARDIOGRAM (TEE);  Surgeon: Vesta Mixer, MD;  Location: [facility] ENDOSCOPY;  Service: Cardiovascular;  Laterality: N/A;  . Atrial fibrillation ablation  07/29/2012    PVI by Dr Johney Frame (second procedure)  . Total knee arthroplasty Right 12/12/2012    Procedure: RIGHT TOTAL KNEE ARTHROPLASTY;  Surgeon: Shelda Pal, MD;  Location: [facility] ORS;  Service: Orthopedics;  Laterality: Right;  . I&d knee with poly exchange Left 12/12/2012    Procedure: LEFT KNEE EXCISION SAPHENOUS NEUROMA/OPEN SCAR DEBRIDEMENT/POLY EXCHANGE/NERVE EXCISION;  Surgeon: Shelda Pal, MD;  Location: [facility] ORS;  Service: Orthopedics;  Laterality: Left;    Current Outpatient Prescriptions  Medication Sig Dispense Refill  . CALCIUM-VITAMIN D PO Take 1 tablet by mouth daily.      . Cholecalciferol (VITAMIN D PO) Take 1 tablet by mouth daily.      . diazepam (VALIUM) 5 MG tablet Take 5 mg by mouth at bedtime.      Marland Kitchen estradiol (VIVELLE-DOT) 0.1 MG/24HR Place 1 patch onto the skin 2 (two) times a week. Monday AND Friday      . ferrous sulfate 325 (65 FE) MG tablet Take 1 tablet (325 mg total) by mouth 3 (  three) times daily after meals.      . fish oil-omega-3 fatty acids 1000 MG capsule Take 2 g by mouth daily.      . metoprolol (TOPROL-XL) 100 MG 24 hr tablet Take one by mouth each morning  30 tablet  6  . metoprolol tartrate (LOPRESSOR) 25 MG tablet Take 1-2 tablets every 6 hours as needed  30 tablet  1  . Multiple Vitamin (MULITIVITAMIN WITH MINERALS) TABS Take 1 tablet by mouth daily.      Marland Kitchen oxyCODONE (OXY IR/ROXICODONE) 5 MG immediate release tablet Take 1-3 tablets (5-15 mg total) by mouth every 3 (three) hours as needed for pain.  120 tablet  0  . pantoprazole (PROTONIX) 40 MG tablet TAKE 1 TABLET BY MOUTH EVERY DAY  30 tablet  5  . progesterone (PROMETRIUM) 100 MG capsule Take 100 mg by mouth at bedtime.      . Rivaroxaban (XARELTO) 20 MG TABS Take 1 tablet (20 mg total) by mouth at bedtime.  30 tablet  3  .  [DISCONTINUED] flecainide (TAMBOCOR) 100 MG tablet Take 1 tablet (100 mg total) by mouth 2 (two) times daily.  60 tablet  12   No current facility-administered medications for this visit.    Physical Exam: Filed Vitals:   02/06/13 1026  BP: 131/65  Pulse: 66  Height: 5\' 11"  (1.803 m)  Weight: 246 lb 12.8 oz (111.948 kg)    GEN- The patient is well appearing, alert and oriented x 3 today.   Head- normocephalic, atraumatic Eyes-  Sclera clear, conjunctiva pink Ears- hearing intact Oropharynx- clear Lungs- Clear to ausculation bilaterally, normal work of breathing Heart- Regular rate and rhythm, no murmurs, rubs or gallops, PMI not laterally displaced GI- soft, NT, ND, + BS Extremities- no clubbing, cyanosis, or edema  ekg today reveals sinus rhythm 64 bpm, otherwise normal ekg  Assessment and Plan:  1. Afib Continue xarelto and metoprolol Given recent increase in afib burden, we talked about AAD options.  She has not well tolerated flecainide previously.  We discussed multaq and tikosyn.  At this time, she would like to try multaq. I will check LFTs today  Return in 3 months

## 2013-02-06 NOTE — Patient Instructions (Addendum)
Your physician recommends that you schedule a follow-up appointment in: 3 months with Dr Johney Frame  Your physician has recommended you make the following change in your medication:  1) Start Multaq 400mg  twice daily with food   Your physician recommends that you return for lab work today

## 2013-02-07 ENCOUNTER — Other Ambulatory Visit (HOSPITAL_COMMUNITY): Payer: Self-pay | Admitting: Internal Medicine

## 2013-02-07 NOTE — Telephone Encounter (Signed)
lmom for pt to call back

## 2013-02-09 ENCOUNTER — Telehealth: Payer: Self-pay | Admitting: Internal Medicine

## 2013-02-09 NOTE — Telephone Encounter (Signed)
New Prob    Pt has some questions and concerns related to MULTAQ. Please call.

## 2013-02-09 NOTE — Telephone Encounter (Signed)
She can not tolerate the medication and will call me back if she decides to go in for a Tikosyn load

## 2013-02-14 NOTE — Telephone Encounter (Signed)
Pt is aware.  

## 2013-03-02 ENCOUNTER — Other Ambulatory Visit: Payer: Self-pay | Admitting: Rehabilitation

## 2013-03-02 DIAGNOSIS — M545 Low back pain: Secondary | ICD-10-CM

## 2013-03-07 ENCOUNTER — Telehealth: Payer: Self-pay | Admitting: Internal Medicine

## 2013-03-07 ENCOUNTER — Ambulatory Visit (INDEPENDENT_AMBULATORY_CARE_PROVIDER_SITE_OTHER): Payer: Managed Care, Other (non HMO) | Admitting: Nurse Practitioner

## 2013-03-07 ENCOUNTER — Encounter: Payer: Self-pay | Admitting: Nurse Practitioner

## 2013-03-07 VITALS — BP 110/80 | HR 72 | Ht 71.0 in | Wt 253.1 lb

## 2013-03-07 DIAGNOSIS — K5732 Diverticulitis of large intestine without perforation or abscess without bleeding: Secondary | ICD-10-CM

## 2013-03-07 MED ORDER — METRONIDAZOLE 500 MG PO TABS: ORAL_TABLET | ORAL | Status: DC

## 2013-03-07 MED ORDER — CIPROFLOXACIN HCL 500 MG PO TABS: 500.0000 mg | ORAL_TABLET | Freq: Two times a day (BID) | ORAL | Status: AC

## 2013-03-07 NOTE — Patient Instructions (Addendum)
We sent prescriptions for the Cipro and Flagyl ( Metronidazole )  To [facility] [address]. Call us if not improving in the next few days .  Also if you spike a fever call us.

## 2013-03-07 NOTE — Telephone Encounter (Signed)
Patient reports that she has LLQ pain that started after eating several days of eating corn on the cobb. She is not sure if this is diverticulitis, but is going on a cruise in a few weeks and wants to make sure.  She will come in and see Willette Cluster RNP today at 3:00

## 2013-03-07 NOTE — Progress Notes (Signed)
  History of Present Illness:  Patient is a 52 year old female known to Dr. Leone Payor for history of recurrent diverticulitis. CT scan September 2012 was compatible with sigmoid diverticulitis. Colonoscopy subsequent to that in November 2012 showed severe sigmoid diverticulosis. She was treated with Augmentin for presumed diverticulitis again in December 2012.  Patient is worked in today for recurrent left lower quadrant pain reminiscent of diverticulitis. Pain unrelated to meals. It is not positional. No nausea. Bowel movements are normal. No fever. No urinary symptoms.  Current Medications, Allergies, Past.edical History, Past Surgical History, Family History and Social History were reviewed in [employer] record.  Physical Exam: General: Well developed , white female in no acute distress Head: Normocephalic and atraumatic Eyes:  sclerae anicteric, conjunctiva pink  Ears: Normal auditory acuity Lungs: Clear throughout to auscultation Heart: Regular rate and rhythm Abdomen: Soft, non distended, mild to moderate left lower quadrant tenderness. No masses, no hepatomegaly. Normal bowel sounds Musculoskeletal: Symmetrical with no gross deformities  Extremities: No edema  Neurological: Alert oriented x 4, grossly nonfocal Psychological:  Alert and cooperative. Normal mood and affect  Assessment and Recommendations:   52 year old female with left lower quadrant pain, suspect this is recurrent sigmoid diverticulitis. Her last episode was December 2012. The best I can tell this would be her third episode of diverticulitis. Her first episode was fall 2012 and documented by CT scan. Her second episode was December 2012. Patient looks okay, abdominal exam is not overly concerning. Will treat with a course of Cipro and Flagyl. She will call after completion of antibiotics with a condition update. Patient knows to call in the interim should abdominal pain worsen and/or she develop  fever.

## 2013-03-08 ENCOUNTER — Encounter: Payer: Self-pay | Admitting: Nurse Practitioner

## 2013-03-10 ENCOUNTER — Other Ambulatory Visit: Payer: Managed Care, Other (non HMO)

## 2013-03-10 NOTE — Progress Notes (Signed)
Agree with Ms. Guenther's assessment and plan. Carl E. Gessner, MD, FACG   

## 2013-03-15 ENCOUNTER — Other Ambulatory Visit: Payer: Self-pay | Admitting: Internal Medicine

## 2013-03-16 ENCOUNTER — Ambulatory Visit
Admission: RE | Admit: 2013-03-16 | Discharge: 2013-03-16 | Disposition: A | Discharge status: Home / Self Care | Payer: Managed Care, Other (non HMO) | Source: Ambulatory Visit | Attending: Rehabilitation | Admitting: Rehabilitation

## 2013-03-16 ENCOUNTER — Telehealth: Payer: Self-pay | Admitting: Internal Medicine

## 2013-03-16 DIAGNOSIS — M545 Low back pain: Secondary | ICD-10-CM

## 2013-03-16 NOTE — Telephone Encounter (Signed)
New Prob  Pt states she has had afib for over 24 hours.

## 2013-03-16 NOTE — Telephone Encounter (Signed)
Back in rhythm now, she is going on cruise and wants to make sure she has Metoprolol to take with her.  I told her it was called in yesterday

## 2013-05-01 ENCOUNTER — Other Ambulatory Visit: Payer: Self-pay | Admitting: Cardiology

## 2013-05-03 ENCOUNTER — Encounter: Payer: Self-pay | Admitting: Internal Medicine

## 2013-05-03 ENCOUNTER — Ambulatory Visit (INDEPENDENT_AMBULATORY_CARE_PROVIDER_SITE_OTHER): Payer: Managed Care, Other (non HMO) | Admitting: Internal Medicine

## 2013-05-03 VITALS — BP 135/78 | HR 60 | Ht 71.0 in | Wt 256.2 lb

## 2013-05-03 DIAGNOSIS — I4891 Unspecified atrial fibrillation: Secondary | ICD-10-CM

## 2013-05-03 NOTE — Patient Instructions (Addendum)
She will go in for a Tikosyn load  Weston Brass, Pharm D will call you to arrange

## 2013-05-03 NOTE — Progress Notes (Signed)
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  She recently underwent knee surgery.  Since that time, her afib has returned.  Frequency is low to moderate.  She is mostly troubled by frequent PACs. Today, she denies symptoms of chest pain, shortness of breath,  lower extremity edema, dizziness, presyncope, or syncope.  The patient is otherwise without complaint today.   Past Medical History  Diagnosis Date  . Atrial fibrillation 03/05/2007    a. s/p RFCA 12/18/2011, 07/29/12  . 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 GERD 12/23/2009  . Sialoadenitis 03/18/2010  . PVC (premature ventricular contraction)   . History of MRSA infection 2008  . Allergic rhinitis   . Cervical disc disease   . Benign fundic gland polyps of stomach   . Hiatal hernia   . Diverticulitis     CT Scan   . Hemorrhoids   . Atrial flutter     a. s/p RFCA 12/18/2011  . Complication of anesthesia   . PONV (postoperative nausea and vomiting)   . Headache(784.0)   . Arthritis   . Family history of anesthesia complication     " My Uncle " unsure of type of problem   Past Surgical History  Procedure Laterality Date  . Knee surgery      x 9 Left Knee  . Upper gastrointestinal endoscopy  01/30/2010    hiatal hernia, fundic gland polyps  . Shoulder surgery      Right   . Elbow surgery      Right  . Foot surgery      Right   . Cervical fusion    . Cesarean section      x 2  . Wrist surgery      Left   . Cholecystectomy    . Flexible sigmoidoscopy      1990's  . Colonoscopy  07/16/2011    diverticulosis  . Tee without cardioversion  12/17/2011    Procedure: TRANSESOPHAGEAL ECHOCARDIOGRAM (TEE);  Surgeon: Laurey Morale, MD;  Location: [facility] ENDOSCOPY;  Service: Cardiovascular;  Laterality: N/A;  . Atrial fibrillation and atrial flutter ablation  12/17/11, 07/29/12    PVI and CTI ablations by Dr  Johney Frame  . Tee without cardioversion  07/28/2012    Procedure: TRANSESOPHAGEAL ECHOCARDIOGRAM (TEE);  Surgeon: Vesta Mixer, MD;  Location: [facility] ENDOSCOPY;  Service: Cardiovascular;  Laterality: N/A;  . Atrial fibrillation ablation  07/29/2012    PVI by Dr Johney Frame (second procedure)  . Total knee arthroplasty Right 12/12/2012    Procedure: RIGHT TOTAL KNEE ARTHROPLASTY;  Surgeon: Shelda Pal, MD;  Location: [facility] ORS;  Service: Orthopedics;  Laterality: Right;  . I&d knee with poly exchange Left 12/12/2012    Procedure: LEFT KNEE EXCISION SAPHENOUS NEUROMA/OPEN SCAR DEBRIDEMENT/POLY EXCHANGE/NERVE EXCISION;  Surgeon: Shelda Pal, MD;  Location: [facility] ORS;  Service: Orthopedics;  Laterality: Left;    Current Outpatient Prescriptions  Medication Sig Dispense Refill  . CALCIUM-VITAMIN D PO Take 1 tablet by mouth daily. 5000 units.      . Cyanocobalamin (VITAMIN B 12 PO) Take 1 tablet by mouth. 5000 units once daily.      . diazepam (VALIUM) 5 MG tablet Take 5 mg by mouth at bedtime.      . fish oil-omega-3 fatty acids 1000 MG capsule Take 2 g by mouth daily.      . metoprolol (TOPROL-XL)  100 MG 24 hr tablet Take one by mouth each morning  30 tablet  6  . metoprolol succinate (TOPROL-XL) 100 MG 24 hr tablet TAKE 1 TABLET BY MOUTH EVERY DAY  90 tablet  3  . metoprolol tartrate (LOPRESSOR) 25 MG tablet TAKE 1 TO 2 TABLETS BY MOUTH EVERY 6 HOURS AS NEEDED  30 tablet  1  . metroNIDAZOLE (FLAGYL) 500 MG tablet Take 1 tab 3 times daily for 10 days.  30 tablet  0  . Multiple Vitamin (MULITIVITAMIN WITH MINERALS) TABS Take 1 tablet by mouth daily.      Marland Kitchen oxyCODONE (OXY IR/ROXICODONE) 5 MG immediate release tablet Take 1-3 tablets (5-15 mg total) by mouth every 3 (three) hours as needed for pain.  120 tablet  0  . pantoprazole (PROTONIX) 40 MG tablet TAKE 1 TABLET BY MOUTH EVERY DAY  30 tablet  5  . Rivaroxaban (XARELTO) 20 MG TABS Take 1 tablet (20 mg total) by mouth at bedtime.  30 tablet  6  . estradiol  (VIVELLE-DOT) 0.1 MG/24HR Place 1 patch onto the skin 2 (two) times a week. Monday AND Friday      . progesterone (PROMETRIUM) 100 MG capsule Take 200 mg by mouth at bedtime.       . [DISCONTINUED] flecainide (TAMBOCOR) 100 MG tablet Take 1 tablet (100 mg total) by mouth 2 (two) times daily.  60 tablet  12   No current facility-administered medications for this visit.    Physical Exam: Filed Vitals:   05/03/13 1410  BP: 135/78  Pulse: 60  Height: 5\' 11"  (1.803 m)  Weight: 256 lb 3.2 oz (116.212 kg)    GEN- The patient is well appearing, alert and oriented x 3 today.   Head- normocephalic, atraumatic Eyes-  Sclera clear, conjunctiva pink Ears- hearing intact Oropharynx- clear Lungs- Clear to ausculation bilaterally, normal work of breathing Heart- Regular rate and rhythm, no murmurs, rubs or gallops, PMI not laterally displaced GI- soft, NT, ND, + BS Extremities- no clubbing, cyanosis, or edema  ekg today reveals sinus rhythm 60 bpm, QTc 428 otherwise normal ekg  Assessment and Plan:  1. Afib Continue xarelto and metoprolol She did not tolerate multaq.  She continues to be symptomatic with afib and PACs.  I think we should proceed with [place] and she agrees.  We will therefore schedule for elective admission for tikosyn load at the next available time.  Return in 5 weeks

## 2013-05-30 ENCOUNTER — Ambulatory Visit: Payer: Managed Care, Other (non HMO) | Admitting: Pharmacist

## 2013-06-14 ENCOUNTER — Telehealth: Payer: Self-pay | Admitting: Pharmacist

## 2013-06-14 NOTE — Telephone Encounter (Signed)
New message    Talk to Destiny Dennis---she cancelled appt for Monday----she said she is feeling the best she has felt in days and do not want to start a new medication at this time---

## 2013-06-14 NOTE — Telephone Encounter (Signed)
LMOM for pt.  Will let Dr. Johney Frame know that patient is feeling better and does not wish to start Tikosyn at this time.  Have asked patient to call back if something changes.

## 2013-06-19 ENCOUNTER — Ambulatory Visit: Payer: Managed Care, Other (non HMO) | Admitting: Pharmacist

## 2013-06-29 ENCOUNTER — Other Ambulatory Visit (HOSPITAL_COMMUNITY): Payer: Self-pay | Admitting: Internal Medicine

## 2013-07-05 ENCOUNTER — Ambulatory Visit: Payer: Managed Care, Other (non HMO) | Admitting: Family

## 2013-09-11 ENCOUNTER — Ambulatory Visit: Payer: Managed Care, Other (non HMO) | Admitting: Gastroenterology

## 2013-09-15 ENCOUNTER — Ambulatory Visit (INDEPENDENT_AMBULATORY_CARE_PROVIDER_SITE_OTHER)
Admission: RE | Admit: 2013-09-15 | Discharge: 2013-09-15 | Disposition: A | Discharge status: Home / Self Care | Payer: Managed Care, Other (non HMO) | Source: Ambulatory Visit | Attending: Gastroenterology | Admitting: Gastroenterology

## 2013-09-15 ENCOUNTER — Encounter: Payer: Self-pay | Admitting: Gastroenterology

## 2013-09-15 ENCOUNTER — Ambulatory Visit (INDEPENDENT_AMBULATORY_CARE_PROVIDER_SITE_OTHER): Payer: Managed Care, Other (non HMO) | Admitting: Gastroenterology

## 2013-09-15 ENCOUNTER — Telehealth: Payer: Self-pay | Admitting: Gastroenterology

## 2013-09-15 ENCOUNTER — Other Ambulatory Visit (INDEPENDENT_AMBULATORY_CARE_PROVIDER_SITE_OTHER): Payer: Managed Care, Other (non HMO)

## 2013-09-15 VITALS — BP 120/80 | HR 76 | Ht 69.75 in | Wt 253.1 lb

## 2013-09-15 DIAGNOSIS — R1032 Left lower quadrant pain: Secondary | ICD-10-CM

## 2013-09-15 LAB — BASIC METABOLIC PANEL
BUN: 17 mg/dL (ref 6–23)
CALCIUM: 9.4 mg/dL (ref 8.4–10.5)
CO2: 23 mEq/L (ref 19–32)
CREATININE: 0.8 mg/dL (ref 0.4–1.2)
Chloride: 105 mEq/L (ref 96–112)
GFR: 79.4 mL/min (ref >60.00)
Glucose, Bld: 90 mg/dL (ref 70–99)
Potassium: 4 mEq/L (ref 3.5–5.1)
Sodium: 138 mEq/L (ref 135–145)

## 2013-09-15 LAB — CBC WITH DIFFERENTIAL/PLATELET
BASOS PCT: 0.4 % (ref 0.0–3.0)
Basophils Absolute: 0 10*3/uL (ref 0.0–0.1)
EOS ABS: 0.1 10*3/uL (ref 0.0–0.7)
Eosinophils Relative: 1 % (ref 0.0–5.0)
HCT: 41.1 % (ref 36.0–46.0)
HEMOGLOBIN: 13.4 g/dL (ref 12.0–15.0)
Lymphocytes Relative: 20.5 % (ref 12.0–46.0)
Lymphs Abs: 1.9 10*3/uL (ref 0.7–4.0)
MCHC: 32.7 g/dL (ref 30.0–36.0)
MCV: 87.3 fl (ref 78.0–100.0)
MONO ABS: 0.5 10*3/uL (ref 0.1–1.0)
Monocytes Relative: 5.3 % (ref 3.0–12.0)
NEUTROS ABS: 6.8 10*3/uL (ref 1.4–7.7)
NEUTROS PCT: 72.8 % (ref 43.0–77.0)
Platelets: 359 10*3/uL (ref 150.0–400.0)
RBC: 4.71 Mil/uL (ref 3.87–5.11)
RDW: 14.2 % (ref 11.5–14.6)
WBC: 9.4 10*3/uL (ref 4.5–10.5)

## 2013-09-15 MED ORDER — IOHEXOL 300 MG/ML  SOLN
100.0000 mL | Freq: Once | INTRAMUSCULAR | Status: AC | PRN
  Administered 2013-09-15: 100 mL via INTRAVENOUS

## 2013-09-15 MED ORDER — CIPROFLOXACIN HCL 500 MG PO TABS: 500.0000 mg | ORAL_TABLET | Freq: Two times a day (BID) | ORAL | Status: AC

## 2013-09-15 MED ORDER — HYOSCYAMINE SULFATE 0.125 MG SL SUBL: 0.1250 mg | SUBLINGUAL_TABLET | Freq: Four times a day (QID) | SUBLINGUAL | Status: DC | PRN

## 2013-09-15 MED ORDER — METRONIDAZOLE 500 MG PO TABS: 500.0000 mg | ORAL_TABLET | Freq: Three times a day (TID) | ORAL | Status: DC

## 2013-09-15 NOTE — Progress Notes (Signed)
Agree with management per Ms. Myrtice Lauth.

## 2013-09-15 NOTE — Telephone Encounter (Signed)
Spoke with patient and told her that CT was negative for diverticulitis.  Advised her that she did not need to pick up the antibiotics, but she should try the antispasmodics that were called in for her instead.

## 2013-09-15 NOTE — Progress Notes (Signed)
09/15/2013 Destiny Dennis [id number] [birth year]   History of Present Illness:  This is a pleasant 53 year old female who is known to Dr. Carlean Purl for previous colonoscopy in November 2012. At that time it showed severe diverticulosis in the sigmoid colon with diverticula scattered throughout the right colon as well. It was recommended that she have a repeat procedure in 10 years from that time.  She's been treated for recurrent episodes of diverticulitis in the past. CT scan in September 2012 was compatible sigmoid diverticulitis. She was then treated with Augmentin for presumed diverticulitis again in December 2012.  Then, in July 2014, she was seen by our nurse practitioner, Tye Savoy, and was treated with 10 days of Cipro and Flagyl. She comes in again today complaining of left lower quadrant abdominal pain, which she is worried is recurrent diverticulitis. She has some minimal nausea, but no vomiting, fevers, chills, change in bowels. She says that the pain has been present for a few days and she had been eating some popcorn prior to the pain developing. She says that she is worried and just wants to catch it before it gets severe like it was the initial attack.   Current Medications, Allergies, Past Medical History, Past Surgical History, Family History and Social History were reviewed in [employer] record.   Physical Exam: BP 120/80  Pulse 76  Ht 5' 9.75" (1.772 m)  Wt 253 lb 1 oz (114.788 kg)  BMI 36.56 kg/m2  LMP 04/17/2012 General: Well developed white female in no acute distress Head: Normocephalic and atraumatic Eyes:  Sclerae anicteric, conjunctiva pink  Ears: Normal auditory acuity Lungs: Clear throughout to auscultation Heart:  Irregularly irregular. Abdomen: Soft, non-distended.  BS preset.  Mild LLQ TTP without R/R/G. Musculoskeletal: Symmetrical with no gross deformities  Extremities: No edema  Neurological: Alert oriented x 4, grossly  non-focal Psychological:  Alert and cooperative. Normal mood and affect  Assessment and Recommendations: -LLQ abdominal pain:  Has been treated for diverticulitis on three separate occasions in the past with documented disease via CT scan only on the first occasion.  She is complaining of LLQ abdominal pain today, but I am not overly convinced that this is a diverticulitis flare.  She may in fact just have some spasm related to the diverticulosis itself.  We will order labs, CBC and BMP.  Will check CT scan of the abdomen and pelvis with contrast today.  If she is in fact having recurrent episodes of documented diverticulitis, then she may need surgical referral to discuss elective sigmoid resection.  Since it is Friday, I will send over prescriptions for Cipro 500 mg BID and Flagyl 500 mg TID for 10 days.  I will also send her over a prescription of levsin to try prn for spasm/cramping, especially if CT is negative for diverticulitis.

## 2013-09-15 NOTE — Patient Instructions (Addendum)
Please go to the basement level to have your labs drawn.   We sent prescriptions to [facility] [facility]. Flagyl ( Metronidazole )    You have been scheduled for a CT scan of the abdomen and pelvis at [facility] ([address]---this is in the same building as [facility]).   You are scheduled today 09-15-2013 at 3:10 PM . You should arrive  At 3:00 PM prior to your appointment time for registration. Please follow the written instructions below on the day of your exam:  WARNING: IF YOU ARE ALLERGIC TO IODINE/X-RAY DYE, PLEASE NOTIFY RADIOLOGY IMMEDIATELY AT [phone]! YOU WILL BE GIVEN A 13 HOUR PREMEDICATION PREP.  1) Do not eat or drink anything after 11:00 am (4 hours prior to your test) 2) You have been given 2 bottles of oral contrast to drink. The solution may taste better if refrigerated, but do NOT add ice or any other liquid to this solution. Shake  well before drinking.    Drink 1 bottle of contrast @ 1:10 PM  (2 hours prior to your exam)  Drink 1 bottle of contrast @ 2:10 PM  (1 hour prior to your exam)  You may take any medications as prescribed with a small amount of water except for the following: Metformin, Glucophage, Glucovance, Avandamet, Riomet, Fortamet, Actoplus Met, Janumet, Glumetza or Metaglip. The above medications must be held the day of the exam AND 48 hours after the exam.  The purpose of you drinking the oral contrast is to aid in the visualization of your intestinal tract. The contrast solution may cause some diarrhea. Before your exam is started, you will be given a small amount of fluid to drink. Depending on your individual set of symptoms, you may also receive an intravenous injection of x-ray contrast/dye. Plan on being at [facility] for 30 minutes or long, depending on the type of exam you are having performed.  If you have any questions regarding your exam or if you need to reschedule, you may  call the CT department at [phone] between the hours of 8:00 am and 5:00 pm, Monday-Friday.  ________________________________________________________________________

## 2013-09-29 ENCOUNTER — Telehealth: Payer: Self-pay | Admitting: Internal Medicine

## 2013-09-29 ENCOUNTER — Other Ambulatory Visit: Payer: Self-pay | Admitting: Internal Medicine

## 2013-09-29 NOTE — Telephone Encounter (Signed)
Received request from Nurse fax box, documents faxed for surgical clearance. To: [employer] Fax number: [phone] Attention: [id number]/[id number]

## 2013-10-09 ENCOUNTER — Other Ambulatory Visit: Payer: Self-pay

## 2013-10-09 DIAGNOSIS — Z9889 Other specified postprocedural states: Secondary | ICD-10-CM

## 2013-10-09 DIAGNOSIS — Z1231 Encounter for screening mammogram for malignant neoplasm of breast: Secondary | ICD-10-CM

## 2013-10-11 ENCOUNTER — Encounter (HOSPITAL_COMMUNITY): Payer: Self-pay | Admitting: Pharmacy Technician

## 2013-10-12 ENCOUNTER — Inpatient Hospital Stay (HOSPITAL_COMMUNITY)
Admission: RE | Admit: 2013-10-12 | Discharge: 2013-10-12 | Disposition: A | Discharge status: Home / Self Care | Payer: Managed Care, Other (non HMO) | Source: Ambulatory Visit

## 2013-10-12 ENCOUNTER — Encounter (HOSPITAL_COMMUNITY): Payer: Self-pay

## 2013-10-12 HISTORY — DX: Anxiety disorder, unspecified: F41.9

## 2013-10-12 NOTE — Patient Instructions (Addendum)
[place]  10/12/2013   Your procedure is scheduled on:   10-17-2013  Report to [facility] at     1200 Noon.  Call this number if you have problems the morning of surgery: [phone]  Or Presurgical Testing [phone](Machell Wirthlin) For Living Will and/or Health Care Power Attorney Forms: please provide copy for your medical record,may bring AM of surgery(Forms should be already notarized -we do not provide this service).(10-12-13 1145 Pt. Desires no further info at this time.)  For Cpap use: Bring mask and tubing only.   Do not eat food:After Midnight.  May have clear liquids:up to 6 Hours before arrival. Nothing after : 0900 AM  Clear liquids include soda, tea, black coffee, apple or grape juice, broth.  Take these medicines the morning of surgery with A SIP OF WATER: Metoprolol.Tylenol./ Follow MD office instructions for Xarelto use.   Do not wear jewelry, make-up or nail polish.  Do not wear lotions, powders, or perfumes. You may wear deodorant.  Do not shave 48 hours(2 days) prior to first CHG shower(legs and under arms).(Shaving face and neck okay.)  Do not bring valuables to the hospital.(Hospital is not responsible for lost valuables).  Contacts, dentures or removable bridgework, body piercing, hair pins may not be worn into surgery.  Leave suitcase in the car. After surgery it may be brought to your room.  For patients admitted to the hospital, checkout time is 11:00 AM the day of discharge.(Restricted visitors-Any Persons displaying flu-like symptoms or illness).    Patients discharged the day of surgery will not be allowed to drive home. Must have responsible person with you x 24 hours once discharged.  Name and phone number of your driver: Jim-spouse [phone] cell  Special Instructions: CHG(Chlorhedine 4%-"Hibiclens","Betasept","Aplicare") Shower Use Special Wash: see special instructions.(avoid face and genitals)   Please read over the following fact  sheets that you were given: MRSA Information, Blood Transfusion fact sheet, Incentive Spirometry Instruction.  Remember : Type/Screen "Blue armbands" - may not be removed once applied(would result in being retested AM of surgery, if removed).  Failure to follow these instructions may result in Cancellation of your surgery.   Patient signature_______________________________________________________

## 2013-10-14 NOTE — H&P (Signed)
TOTAL HIP ADMISSION H&P  Patient is admitted for left total hip arthroplasty, anterior approach.  Subjective:  Chief Complaint: Left hip OA / pain  HPI: Destiny Dennis, 53 y.o. female, has a history of pain and functional disability in the left hip(s) due to arthritis and patient has failed non-surgical conservative treatments for greater than 12 weeks to include NSAID's and/or analgesics, corticosteriod injections and activity modification.  Onset of symptoms was gradual starting 2+ years ago with gradually worsening course since that time.The patient noted no past surgery on the left hip(s).  Patient currently rates pain in the left hip at 8 out of 10 with activity. Patient has worsening of pain with activity and weight bearing, trendelenberg gait, pain that interfers with activities of daily living and pain with passive range of motion. Patient has evidence of periarticular osteophytes and joint space narrowing by imaging studies. This condition presents safety issues increasing the risk of falls. There is no current active infection.  Risks, benefits and expectations were discussed with the patient.  Risks including but not limited to the risk of anesthesia, blood clots, nerve damage, blood vessel damage, failure of the prosthesis, infection and up to and including death.  Patient understand the risks, benefits and expectations and wishes to proceed with surgery.   D/C Plans:   Home with HHPT  Post-op Meds:    No Rx given  Tranexamic Acid:   Not to be given - CAD   Decadron:    To be given  FYI:    Xarelto 10 mg 2 days, then 20mg   Norco post-op   Patient Active Problem List   Diagnosis Date Noted  . Expected blood loss anemia 12/14/2012  . Obese 12/14/2012  . S/P right TKA 12/12/2012  . S/P left knee revision 12/12/2012  . Atrial flutter   . Symptomatic menopausal or female climacteric states 12/07/2011  . Anxiety 12/07/2011  . Obesity 06/10/2011  . Diverticulitis of sigmoid  colon - suspected to be recurrent 04/27/2011  . SVT (supraventricular tachycardia) 02/11/2011  . Palpitations 02/11/2011  . Bronchitis 01/17/2011  . GERD 12/23/2009  . Atrial fibrillation 03/05/2007   Past Medical History  Diagnosis Date  . Atrial fibrillation 03/05/2007    a. s/p RFCA 12/18/2011, 07/29/12  . 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 Sialoadenitis 03/18/2010  . PVC (premature ventricular contraction)   . History of MRSA infection 2008    superficial skin-cleared easily with doxycycline  . Allergic rhinitis   . Cervical disc disease   . Benign fundic gland polyps of stomach   . Diverticulitis     CT Scan   . Hemorrhoids   . Atrial flutter     a. s/p RFCA 12/18/2011  . Complication of anesthesia   . PONV (postoperative nausea and vomiting)   . Arthritis   . Family history of anesthesia complication     " My Uncle " unsure of type of problem  . Anxiety     when tachycardia occurs  . GERD 12/23/2009    diet related-not a problem  . Hiatal hernia     small  . Headache(784.0)     migraines occ.    Past Surgical History  Procedure Laterality Date  . Knee surgery      x 9 Left Knee  . Upper gastrointestinal endoscopy  01/30/2010    hiatal hernia, fundic gland polyps  . Shoulder surgery      Right   . Elbow surgery  Right  . Foot surgery      Right   . Cervical fusion    . Cesarean section      x 2  . Wrist surgery      Left   . Cholecystectomy    . Flexible sigmoidoscopy      1990's  . Colonoscopy  07/16/2011    diverticulosis  . Tee without cardioversion  12/17/2011    Procedure: TRANSESOPHAGEAL ECHOCARDIOGRAM (TEE);  Surgeon: Larey Dresser, MD;  Location: [facility] ENDOSCOPY;  Service: Cardiovascular;  Laterality: N/A;  . Atrial fibrillation and atrial flutter ablation  12/17/11, 07/29/12    PVI and CTI ablations by Dr Rayann Heman  . Tee without cardioversion  07/28/2012    Procedure: TRANSESOPHAGEAL ECHOCARDIOGRAM (TEE);  Surgeon: Thayer Headings, MD;   Location: [facility] ENDOSCOPY;  Service: Cardiovascular;  Laterality: N/A;  . Atrial fibrillation ablation  07/29/2012    PVI by Dr Rayann Heman (second procedure)  . Total knee arthroplasty Right 12/12/2012    Procedure: RIGHT TOTAL KNEE ARTHROPLASTY;  Surgeon: Mauri Pole, MD;  Location: [facility] ORS;  Service: Orthopedics;  Laterality: Right;  . I&d knee with poly exchange Left 12/12/2012    Procedure: LEFT KNEE EXCISION SAPHENOUS NEUROMA/OPEN SCAR DEBRIDEMENT/POLY EXCHANGE/NERVE EXCISION;  Surgeon: Mauri Pole, MD;  Location: [facility] ORS;  Service: Orthopedics;  Laterality: Left;  . Joint replacement Bilateral     4'14    No prescriptions prior to admission   Allergies  Allergen Reactions  . Avelox [Moxifloxacin Hcl In Nacl] Other (See Comments)    Numbness & tingling Peripheral neuropathy    History  Substance Use Topics  . Smoking status: Never Smoker   . Smokeless tobacco: Never Used  . Alcohol Use: Yes     Comment: rare social    Family History  Problem Relation Age of Onset  . Diabetes Maternal Grandfather   . Colon cancer Neg Hx   . Diverticulitis Brother     x 2  . Diverticulitis Paternal Grandmother      Review of Systems  Constitutional: Negative.   Eyes: Negative.   Respiratory: Negative.   Cardiovascular: Negative.   Gastrointestinal: Positive for heartburn.  Genitourinary: Negative.   Musculoskeletal: Negative.   Skin: Negative.   Neurological: Positive for headaches.  Endo/Heme/Allergies: Positive for environmental allergies.  Psychiatric/Behavioral: The patient is nervous/anxious.     Objective:  Physical Exam  Constitutional: She is oriented to person, place, and time. She appears well-developed and well-nourished.  HENT:  Head: Normocephalic and atraumatic.  Mouth/Throat: Oropharynx is clear and moist.  Eyes: Pupils are equal, round, and reactive to light.  Neck: Neck supple. No JVD present. No tracheal deviation present. No thyromegaly present.   Cardiovascular: Normal rate and intact distal pulses.   Respiratory: Effort normal and breath sounds normal. No stridor. No respiratory distress. She has no wheezes.  GI: Soft. There is no tenderness. There is no guarding.  Musculoskeletal:       Left hip: She exhibits decreased range of motion, decreased strength, tenderness and bony tenderness. She exhibits no swelling, no deformity and no laceration.  Lymphadenopathy:    She has no cervical adenopathy.  Neurological: She is alert and oriented to person, place, and time.  Skin: Skin is warm and dry.  Psychiatric: She has a normal mood and affect.     Labs:  Estimated body mass index is 36.56 kg/(m^2) as calculated from the following:   Height as of 09/15/13: 5' 9.75" (1.772 m).  Weight as of 09/15/13: 114.788 kg (253 lb 1 oz).   Imaging Review Plain radiographs demonstrate severe degenerative joint disease of the left hip(s). The bone quality appears to be good for age and reported activity level.  Assessment/Plan:  End stage arthritis, left hip(s)  The patient history, physical examination, clinical judgement of the provider and imaging studies are consistent with end stage degenerative joint disease of the left hip(s) and total hip arthroplasty is deemed medically necessary. The treatment options including medical management, injection therapy, arthroscopy and arthroplasty were discussed at length. The risks and benefits of total hip arthroplasty were presented and reviewed. The risks due to aseptic loosening, infection, stiffness, dislocation/subluxation,  thromboembolic complications and other imponderables were discussed.  The patient acknowledged the explanation, agreed to proceed with the plan and consent was signed. Patient is being admitted for inpatient treatment for surgery, pain control, PT, OT, prophylactic antibiotics, VTE prophylaxis, progressive ambulation and ADL's and discharge planning.The patient is planning to be  discharged home with home health services.     West Pugh Sherlonda Flater   PAC  10/14/2013, 11:50 AM

## 2013-10-16 ENCOUNTER — Telehealth: Payer: Self-pay | Admitting: Internal Medicine

## 2013-10-16 ENCOUNTER — Encounter (HOSPITAL_COMMUNITY)
Admission: RE | Admit: 2013-10-16 | Discharge: 2013-10-16 | Disposition: A | Discharge status: Home / Self Care | Payer: Managed Care, Other (non HMO) | Source: Ambulatory Visit | Attending: Orthopedic Surgery | Admitting: Orthopedic Surgery

## 2013-10-16 ENCOUNTER — Ambulatory Visit (HOSPITAL_COMMUNITY)
Admission: RE | Admit: 2013-10-16 | Discharge: 2013-10-16 | Disposition: A | Discharge status: Home / Self Care | Payer: Managed Care, Other (non HMO) | Source: Ambulatory Visit | Attending: Orthopedic Surgery | Admitting: Orthopedic Surgery

## 2013-10-16 ENCOUNTER — Encounter (HOSPITAL_COMMUNITY): Payer: Self-pay

## 2013-10-16 DIAGNOSIS — Z01818 Encounter for other preprocedural examination: Secondary | ICD-10-CM | POA: Insufficient documentation

## 2013-10-16 DIAGNOSIS — Z01812 Encounter for preprocedural laboratory examination: Secondary | ICD-10-CM

## 2013-10-16 DIAGNOSIS — I4891 Unspecified atrial fibrillation: Secondary | ICD-10-CM | POA: Insufficient documentation

## 2013-10-16 LAB — URINALYSIS, ROUTINE W REFLEX MICROSCOPIC
Bilirubin Urine: NEGATIVE
GLUCOSE, UA: NEGATIVE mg/dL
Ketones, ur: NEGATIVE mg/dL
LEUKOCYTES UA: NEGATIVE
Nitrite: NEGATIVE
PH: 6 (ref 5.0–8.0)
Protein, ur: NEGATIVE mg/dL
SPECIFIC GRAVITY, URINE: 1.006 (ref 1.005–1.030)
Urobilinogen, UA: 0.2 mg/dL (ref 0.0–1.0)

## 2013-10-16 LAB — PROTIME-INR
INR: 1.06 (ref 0.00–1.49)
Prothrombin Time: 13.6 seconds (ref 11.6–15.2)

## 2013-10-16 LAB — CBC
HCT: 37.4 % (ref 36.0–46.0)
HEMOGLOBIN: 12.8 g/dL (ref 12.0–15.0)
MCH: 29.2 pg (ref 26.0–34.0)
MCHC: 34.2 g/dL (ref 30.0–36.0)
MCV: 85.4 fL (ref 78.0–100.0)
Platelets: 296 10*3/uL (ref 150–400)
RBC: 4.38 MIL/uL (ref 3.87–5.11)
RDW: 13.8 % (ref 11.5–15.5)
WBC: 7 10*3/uL (ref 4.0–10.5)

## 2013-10-16 LAB — SURGICAL PCR SCREEN
MRSA, PCR: NEGATIVE
STAPHYLOCOCCUS AUREUS: NEGATIVE

## 2013-10-16 LAB — BASIC METABOLIC PANEL
BUN: 14 mg/dL (ref 6–23)
CO2: 24 mEq/L (ref 19–32)
CREATININE: 0.79 mg/dL (ref 0.50–1.10)
Calcium: 9.2 mg/dL (ref 8.4–10.5)
Chloride: 101 mEq/L (ref 96–112)
GFR calc non Af Amer: >90 mL/min (ref >90)
GLUCOSE: 95 mg/dL (ref 70–99)
POTASSIUM: 4.3 meq/L (ref 3.7–5.3)
Sodium: 139 mEq/L (ref 137–147)

## 2013-10-16 LAB — APTT: aPTT: 37 seconds (ref 24–37)

## 2013-10-16 LAB — URINE MICROSCOPIC-ADD ON

## 2013-10-16 NOTE — Pre-Procedure Instructions (Addendum)
10-16-13 CXR done today.    Ekg 05-03-13-Epic. Info given with rescheduled  Lab appointment today due to weather 10-12-13.

## 2013-10-16 NOTE — Telephone Encounter (Signed)
New problem   Pt need to speak to nurse concerning increase in afib. Please call pt.

## 2013-10-16 NOTE — Telephone Encounter (Addendum)
Spoke with patient and she feels like it has been in and out over the last 10 days.  Will go ahead a proceed with surgery and will call me back to schedule Tikosyn load.  She will call me

## 2013-10-17 ENCOUNTER — Inpatient Hospital Stay (HOSPITAL_COMMUNITY): Payer: Managed Care, Other (non HMO)

## 2013-10-17 ENCOUNTER — Inpatient Hospital Stay (HOSPITAL_COMMUNITY): Payer: Managed Care, Other (non HMO) | Admitting: Certified Registered"

## 2013-10-17 ENCOUNTER — Encounter (HOSPITAL_COMMUNITY): Payer: Self-pay | Admitting: *Deleted

## 2013-10-17 ENCOUNTER — Encounter (HOSPITAL_COMMUNITY)
Admission: RE | Disposition: A | Discharge status: Home Health | Payer: Self-pay | Source: Ambulatory Visit | Attending: Orthopedic Surgery

## 2013-10-17 ENCOUNTER — Inpatient Hospital Stay (HOSPITAL_COMMUNITY)
Admission: RE | Admit: 2013-10-17 | Discharge: 2013-10-24 | DRG: 470 | Disposition: A | Discharge status: Home Health | Payer: Managed Care, Other (non HMO) | Source: Ambulatory Visit | Attending: Orthopedic Surgery | Admitting: Orthopedic Surgery

## 2013-10-17 DIAGNOSIS — Y831 Surgical operation with implant of artificial internal device as the cause of abnormal reaction of the patient, or of later complication, without mention of misadventure at the time of the procedure: Secondary | ICD-10-CM | POA: Diagnosis not present

## 2013-10-17 DIAGNOSIS — Z96659 Presence of unspecified artificial knee joint: Secondary | ICD-10-CM

## 2013-10-17 DIAGNOSIS — I998 Other disorder of circulatory system: Secondary | ICD-10-CM | POA: Diagnosis not present

## 2013-10-17 DIAGNOSIS — M1610 Unilateral primary osteoarthritis, unspecified hip: Principal | ICD-10-CM | POA: Diagnosis present

## 2013-10-17 DIAGNOSIS — E669 Obesity, unspecified: Secondary | ICD-10-CM

## 2013-10-17 DIAGNOSIS — K219 Gastro-esophageal reflux disease without esophagitis: Secondary | ICD-10-CM | POA: Diagnosis present

## 2013-10-17 DIAGNOSIS — Z01812 Encounter for preprocedural laboratory examination: Secondary | ICD-10-CM

## 2013-10-17 DIAGNOSIS — D62 Acute posthemorrhagic anemia: Secondary | ICD-10-CM | POA: Diagnosis not present

## 2013-10-17 DIAGNOSIS — Z881 Allergy status to other antibiotic agents status: Secondary | ICD-10-CM

## 2013-10-17 DIAGNOSIS — Z86718 Personal history of other venous thrombosis and embolism: Secondary | ICD-10-CM

## 2013-10-17 DIAGNOSIS — I82890 Acute embolism and thrombosis of other specified veins: Secondary | ICD-10-CM | POA: Diagnosis not present

## 2013-10-17 DIAGNOSIS — M169 Osteoarthritis of hip, unspecified: Principal | ICD-10-CM | POA: Diagnosis present

## 2013-10-17 DIAGNOSIS — I82409 Acute embolism and thrombosis of unspecified deep veins of unspecified lower extremity: Secondary | ICD-10-CM

## 2013-10-17 DIAGNOSIS — Z6836 Body mass index (BMI) 36.0-36.9, adult: Secondary | ICD-10-CM

## 2013-10-17 DIAGNOSIS — Z96642 Presence of left artificial hip joint: Secondary | ICD-10-CM

## 2013-10-17 DIAGNOSIS — I4891 Unspecified atrial fibrillation: Secondary | ICD-10-CM

## 2013-10-17 DIAGNOSIS — Z7901 Long term (current) use of anticoagulants: Secondary | ICD-10-CM

## 2013-10-17 DIAGNOSIS — I4892 Unspecified atrial flutter: Secondary | ICD-10-CM

## 2013-10-17 DIAGNOSIS — Z8614 Personal history of Methicillin resistant Staphylococcus aureus infection: Secondary | ICD-10-CM

## 2013-10-17 HISTORY — PX: TOTAL HIP ARTHROPLASTY: SHX124

## 2013-10-17 LAB — TYPE AND SCREEN
ABO/RH(D): O POS
Antibody Screen: NEGATIVE

## 2013-10-17 SURGERY — ARTHROPLASTY, HIP, TOTAL, ANTERIOR APPROACH
Anesthesia: Spinal | Site: Hip | Laterality: Left

## 2013-10-17 MED ORDER — RIVAROXABAN 10 MG PO TABS
10.0000 mg | ORAL_TABLET | Freq: Every day | ORAL | Status: DC
  Administered 2013-10-18: 10 mg via ORAL
  Filled 2013-10-17 (×2): qty 1

## 2013-10-17 MED ORDER — BUPIVACAINE IN DEXTROSE 0.75-8.25 % IT SOLN
INTRATHECAL | Status: DC | PRN
  Administered 2013-10-17: 2 mL via INTRATHECAL

## 2013-10-17 MED ORDER — LACTATED RINGERS IV SOLN
INTRAVENOUS | Status: DC | PRN
  Administered 2013-10-17: 17:00:00
  Administered 2013-10-17 (×3): via INTRAVENOUS

## 2013-10-17 MED ORDER — CEFAZOLIN SODIUM-DEXTROSE 2-3 GM-% IV SOLR
2.0000 g | INTRAVENOUS | Status: AC
  Administered 2013-10-17: 2 g via INTRAVENOUS

## 2013-10-17 MED ORDER — PHENYLEPHRINE 40 MCG/ML (10ML) SYRINGE FOR IV PUSH (FOR BLOOD PRESSURE SUPPORT)
PREFILLED_SYRINGE | INTRAVENOUS | Status: AC
  Filled 2013-10-17: qty 10

## 2013-10-17 MED ORDER — PROMETHAZINE HCL 25 MG/ML IJ SOLN: 6.2500 mg | INTRAMUSCULAR | Status: DC | PRN

## 2013-10-17 MED ORDER — MIDAZOLAM HCL 2 MG/2ML IJ SOLN
INTRAMUSCULAR | Status: AC
  Filled 2013-10-17: qty 2

## 2013-10-17 MED ORDER — CEFAZOLIN SODIUM 1-5 GM-% IV SOLN
1.0000 g | Freq: Four times a day (QID) | INTRAVENOUS | Status: AC
  Administered 2013-10-17 – 2013-10-18 (×2): 1 g via INTRAVENOUS
  Filled 2013-10-17 (×2): qty 50

## 2013-10-17 MED ORDER — VITAMIN D 1000 UNITS PO TABS
4000.0000 [IU] | ORAL_TABLET | Freq: Every day | ORAL | Status: DC
  Administered 2013-10-18 – 2013-10-24 (×7): 4000 [IU] via ORAL
  Filled 2013-10-17 (×7): qty 4

## 2013-10-17 MED ORDER — METOPROLOL SUCCINATE ER 100 MG PO TB24
100.0000 mg | ORAL_TABLET | Freq: Every morning | ORAL | Status: DC
  Administered 2013-10-18 – 2013-10-20 (×3): 100 mg via ORAL
  Filled 2013-10-17 (×4): qty 1

## 2013-10-17 MED ORDER — MENTHOL 3 MG MT LOZG
1.0000 | LOZENGE | OROMUCOSAL | Status: DC | PRN
  Filled 2013-10-17: qty 9

## 2013-10-17 MED ORDER — ACETAMINOPHEN 650 MG RE SUPP: 650.0000 mg | Freq: Four times a day (QID) | RECTAL | Status: DC | PRN

## 2013-10-17 MED ORDER — ONDANSETRON HCL 4 MG/2ML IJ SOLN: 4.0000 mg | Freq: Four times a day (QID) | INTRAMUSCULAR | Status: DC | PRN

## 2013-10-17 MED ORDER — RIVAROXABAN 20 MG PO TABS: 20.0000 mg | ORAL_TABLET | Freq: Every morning | ORAL | Status: DC

## 2013-10-17 MED ORDER — PROPOFOL 10 MG/ML IV BOLUS
INTRAVENOUS | Status: AC
  Filled 2013-10-17: qty 20

## 2013-10-17 MED ORDER — HYDROMORPHONE HCL PF 1 MG/ML IJ SOLN
0.5000 mg | INTRAMUSCULAR | Status: DC | PRN
  Administered 2013-10-17: 2 mg via INTRAVENOUS
  Administered 2013-10-17 – 2013-10-18 (×3): 1 mg via INTRAVENOUS
  Administered 2013-10-18: 2 mg via INTRAVENOUS
  Administered 2013-10-18 – 2013-10-19 (×3): 1 mg via INTRAVENOUS
  Administered 2013-10-19 – 2013-10-20 (×2): 2 mg via INTRAVENOUS
  Filled 2013-10-17 (×2): qty 1
  Filled 2013-10-17 (×3): qty 2
  Filled 2013-10-17: qty 1
  Filled 2013-10-17: qty 2
  Filled 2013-10-17 (×3): qty 1

## 2013-10-17 MED ORDER — ALUM & MAG HYDROXIDE-SIMETH 200-200-20 MG/5ML PO SUSP: 30.0000 mL | ORAL | Status: DC | PRN

## 2013-10-17 MED ORDER — DEXAMETHASONE SODIUM PHOSPHATE 10 MG/ML IJ SOLN
INTRAMUSCULAR | Status: AC
  Filled 2013-10-17: qty 1

## 2013-10-17 MED ORDER — METOCLOPRAMIDE HCL 5 MG/ML IJ SOLN: 5.0000 mg | Freq: Three times a day (TID) | INTRAMUSCULAR | Status: DC | PRN

## 2013-10-17 MED ORDER — HYDROMORPHONE HCL PF 1 MG/ML IJ SOLN
0.2500 mg | INTRAMUSCULAR | Status: DC | PRN
Start: 2013-10-17 — End: 2013-10-17
  Administered 2013-10-17 (×2): 0.5 mg via INTRAVENOUS

## 2013-10-17 MED ORDER — METOPROLOL TARTRATE 25 MG PO TABS
25.0000 mg | ORAL_TABLET | Freq: Four times a day (QID) | ORAL | Status: DC | PRN
  Administered 2013-10-17 (×2): 25 mg via ORAL
  Administered 2013-10-18: 50 mg via ORAL
  Administered 2013-10-20 (×2): 25 mg via ORAL
  Filled 2013-10-17 (×4): qty 2

## 2013-10-17 MED ORDER — 0.9 % SODIUM CHLORIDE (POUR BTL) OPTIME
TOPICAL | Status: DC | PRN
  Administered 2013-10-17: 1000 mL

## 2013-10-17 MED ORDER — PHENYLEPHRINE HCL 10 MG/ML IJ SOLN
10.0000 mg | INTRAVENOUS | Status: DC | PRN
  Administered 2013-10-17: 10 ug/min via INTRAVENOUS

## 2013-10-17 MED ORDER — ESMOLOL HCL 10 MG/ML IV SOLN
INTRAVENOUS | Status: DC | PRN
  Administered 2013-10-17 (×2): 20 mg via INTRAVENOUS

## 2013-10-17 MED ORDER — OMEGA-3-ACID ETHYL ESTERS 1 G PO CAPS
2.0000 g | ORAL_CAPSULE | Freq: Every day | ORAL | Status: DC
  Administered 2013-10-18 – 2013-10-24 (×7): 2 g via ORAL
  Filled 2013-10-17 (×7): qty 2

## 2013-10-17 MED ORDER — METOCLOPRAMIDE HCL 5 MG PO TABS
5.0000 mg | ORAL_TABLET | Freq: Three times a day (TID) | ORAL | Status: DC | PRN
  Filled 2013-10-17: qty 2

## 2013-10-17 MED ORDER — FENTANYL CITRATE 0.05 MG/ML IJ SOLN
INTRAMUSCULAR | Status: AC
  Filled 2013-10-17: qty 2

## 2013-10-17 MED ORDER — POLYETHYLENE GLYCOL 3350 17 G PO PACK
17.0000 g | PACK | Freq: Two times a day (BID) | ORAL | Status: DC
  Administered 2013-10-18 – 2013-10-24 (×10): 17 g via ORAL
  Filled 2013-10-17 (×15): qty 1

## 2013-10-17 MED ORDER — DOCUSATE SODIUM 100 MG PO CAPS
100.0000 mg | ORAL_CAPSULE | Freq: Two times a day (BID) | ORAL | Status: DC
  Administered 2013-10-17 – 2013-10-24 (×14): 100 mg via ORAL
  Filled 2013-10-17 (×15): qty 1

## 2013-10-17 MED ORDER — BARBERRY-OREG GRAPE-GOLDENSEAL 200-200-50 MG PO CAPS: 1.0000 | ORAL_CAPSULE | Freq: Every day | ORAL | Status: DC

## 2013-10-17 MED ORDER — CHLORHEXIDINE GLUCONATE 4 % EX LIQD
60.0000 mL | Freq: Once | CUTANEOUS | Status: DC
  Administered 2013-10-17: 4 via TOPICAL

## 2013-10-17 MED ORDER — DIAZEPAM 5 MG PO TABS
5.0000 mg | ORAL_TABLET | Freq: Every day | ORAL | Status: DC
  Administered 2013-10-17 – 2013-10-23 (×7): 5 mg via ORAL
  Filled 2013-10-17 (×6): qty 1
  Filled 2013-10-17: qty 3

## 2013-10-17 MED ORDER — FENTANYL CITRATE 0.05 MG/ML IJ SOLN
INTRAMUSCULAR | Status: DC | PRN
  Administered 2013-10-17: 100 ug via INTRAVENOUS

## 2013-10-17 MED ORDER — OXYCODONE HCL 5 MG/5ML PO SOLN
5.0000 mg | Freq: Once | ORAL | Status: DC | PRN
  Filled 2013-10-17: qty 5

## 2013-10-17 MED ORDER — CEFAZOLIN SODIUM-DEXTROSE 2-3 GM-% IV SOLR
INTRAVENOUS | Status: AC
  Filled 2013-10-17: qty 50

## 2013-10-17 MED ORDER — METHOCARBAMOL 500 MG PO TABS
500.0000 mg | ORAL_TABLET | Freq: Four times a day (QID) | ORAL | Status: DC | PRN
  Administered 2013-10-17 – 2013-10-23 (×15): 500 mg via ORAL
  Filled 2013-10-17 (×15): qty 1

## 2013-10-17 MED ORDER — ADULT MULTIVITAMIN W/MINERALS CH
1.0000 | ORAL_TABLET | Freq: Every day | ORAL | Status: DC
  Administered 2013-10-18 – 2013-10-24 (×7): 1 via ORAL
  Filled 2013-10-17 (×7): qty 1

## 2013-10-17 MED ORDER — ACETAMINOPHEN 325 MG PO TABS
650.0000 mg | ORAL_TABLET | Freq: Four times a day (QID) | ORAL | Status: DC | PRN
Start: 2013-10-17 — End: 2013-10-24
  Administered 2013-10-19: 650 mg via ORAL
  Filled 2013-10-17: qty 2

## 2013-10-17 MED ORDER — BISACODYL 5 MG PO TBEC: 5.0000 mg | DELAYED_RELEASE_TABLET | Freq: Every day | ORAL | Status: DC | PRN

## 2013-10-17 MED ORDER — PHENOL 1.4 % MT LIQD
1.0000 | OROMUCOSAL | Status: DC | PRN
  Filled 2013-10-17: qty 177

## 2013-10-17 MED ORDER — MEPERIDINE HCL 50 MG/ML IJ SOLN: 6.2500 mg | INTRAMUSCULAR | Status: DC | PRN

## 2013-10-17 MED ORDER — HYDROMORPHONE HCL PF 1 MG/ML IJ SOLN
INTRAMUSCULAR | Status: AC
  Administered 2013-10-17: 17:00:00
  Filled 2013-10-17: qty 1

## 2013-10-17 MED ORDER — OMEGA-3 FATTY ACIDS 1000 MG PO CAPS: 2.0000 g | ORAL_CAPSULE | Freq: Every day | ORAL | Status: DC

## 2013-10-17 MED ORDER — PROPOFOL INFUSION 10 MG/ML OPTIME
INTRAVENOUS | Status: DC | PRN
  Administered 2013-10-17: 100 ug/kg/min via INTRAVENOUS

## 2013-10-17 MED ORDER — HYDROCODONE-ACETAMINOPHEN 7.5-325 MG PO TABS
1.0000 | ORAL_TABLET | Freq: Four times a day (QID) | ORAL | Status: DC
  Administered 2013-10-17 – 2013-10-20 (×11): 1 via ORAL
  Filled 2013-10-17 (×11): qty 1

## 2013-10-17 MED ORDER — MIDAZOLAM HCL 5 MG/5ML IJ SOLN
INTRAMUSCULAR | Status: DC | PRN
  Administered 2013-10-17: 2 mg via INTRAVENOUS

## 2013-10-17 MED ORDER — PANTOPRAZOLE SODIUM 40 MG PO TBEC: 40.0000 mg | DELAYED_RELEASE_TABLET | Freq: Every day | ORAL | Status: DC | PRN

## 2013-10-17 MED ORDER — DEXAMETHASONE SODIUM PHOSPHATE 10 MG/ML IJ SOLN
10.0000 mg | Freq: Once | INTRAMUSCULAR | Status: AC
  Administered 2013-10-17: 10 mg via INTRAVENOUS

## 2013-10-17 MED ORDER — PHENYLEPHRINE HCL 10 MG/ML IJ SOLN
INTRAMUSCULAR | Status: DC | PRN
  Administered 2013-10-17: 80 ug via INTRAVENOUS
  Administered 2013-10-17 (×3): 40 ug via INTRAVENOUS

## 2013-10-17 MED ORDER — DIPHENHYDRAMINE HCL 25 MG PO CAPS
25.0000 mg | ORAL_CAPSULE | Freq: Four times a day (QID) | ORAL | Status: DC | PRN
  Administered 2013-10-17 – 2013-10-18 (×3): 25 mg via ORAL
  Filled 2013-10-17 (×3): qty 1

## 2013-10-17 MED ORDER — SODIUM CHLORIDE 0.9 % IV SOLN
INTRAVENOUS | Status: DC
  Administered 2013-10-18 – 2013-10-20 (×5): via INTRAVENOUS
  Filled 2013-10-17 (×12): qty 1000

## 2013-10-17 MED ORDER — DEXAMETHASONE SODIUM PHOSPHATE 10 MG/ML IJ SOLN
10.0000 mg | Freq: Once | INTRAMUSCULAR | Status: AC
  Administered 2013-10-18: 10 mg via INTRAVENOUS
  Filled 2013-10-17: qty 1

## 2013-10-17 MED ORDER — ONDANSETRON HCL 4 MG PO TABS: 4.0000 mg | ORAL_TABLET | Freq: Four times a day (QID) | ORAL | Status: DC | PRN

## 2013-10-17 MED ORDER — METHOCARBAMOL 100 MG/ML IJ SOLN
500.0000 mg | Freq: Four times a day (QID) | INTRAVENOUS | Status: DC | PRN
  Filled 2013-10-17: qty 5

## 2013-10-17 MED ORDER — OXYCODONE HCL 5 MG PO TABS: 5.0000 mg | ORAL_TABLET | Freq: Once | ORAL | Status: DC | PRN

## 2013-10-17 MED ORDER — ESMOLOL HCL 10 MG/ML IV SOLN
INTRAVENOUS | Status: AC
  Filled 2013-10-17: qty 10

## 2013-10-17 MED ORDER — ONDANSETRON HCL 4 MG/2ML IJ SOLN
INTRAMUSCULAR | Status: AC
  Filled 2013-10-17: qty 2

## 2013-10-17 SURGICAL SUPPLY — 40 items
ADH SKN CLS APL DERMABOND .7 (GAUZE/BANDAGES/DRESSINGS) ×1
BAG SPEC THK2 15X12 ZIP CLS (MISCELLANEOUS)
BAG ZIPLOCK 12X15 (MISCELLANEOUS) IMPLANT
BLADE SAW SGTL 18X1.27X75 (BLADE) ×2 IMPLANT
BLADE SAW SGTL 18X1.27X75MM (BLADE) ×1
CAPT HIP PF COP ×3 IMPLANT
DERMABOND ADVANCED (GAUZE/BANDAGES/DRESSINGS) ×2
DERMABOND ADVANCED .7 DNX12 (GAUZE/BANDAGES/DRESSINGS) ×1 IMPLANT
DRAPE C-ARM 42X120 X-RAY (DRAPES) ×3 IMPLANT
DRAPE STERI IOBAN 125X83 (DRAPES) ×3 IMPLANT
DRAPE U-SHAPE 47X51 STRL (DRAPES) ×9 IMPLANT
DRSG AQUACEL AG ADV 3.5X10 (GAUZE/BANDAGES/DRESSINGS) ×3 IMPLANT
DRSG TEGADERM 4X4.75 (GAUZE/BANDAGES/DRESSINGS) IMPLANT
DURAPREP 26ML APPLICATOR (WOUND CARE) ×3 IMPLANT
ELECT BLADE TIP CTD 4 INCH (ELECTRODE) ×3 IMPLANT
ELECT REM PT RETURN 9FT ADLT (ELECTROSURGICAL) ×3
ELECTRODE REM PT RTRN 9FT ADLT (ELECTROSURGICAL) ×1 IMPLANT
EVACUATOR 1/8 PVC DRAIN (DRAIN) IMPLANT
FACESHIELD LNG OPTICON STERILE (SAFETY) ×12 IMPLANT
GAUZE SPONGE 2X2 8PLY STRL LF (GAUZE/BANDAGES/DRESSINGS) IMPLANT
GLOVE BIOGEL PI IND STRL 7.5 (GLOVE) ×1 IMPLANT
GLOVE BIOGEL PI IND STRL 8 (GLOVE) ×1 IMPLANT
GLOVE BIOGEL PI INDICATOR 7.5 (GLOVE) ×2
GLOVE BIOGEL PI INDICATOR 8 (GLOVE) ×2
GLOVE ECLIPSE 8.0 STRL XLNG CF (GLOVE) ×5 IMPLANT
GLOVE ORTHO TXT STRL SZ7.5 (GLOVE) ×6 IMPLANT
GOWN SPEC L3 XXLG W/TWL (GOWN DISPOSABLE) ×5 IMPLANT
GOWN STRL REUS W/TWL LRG LVL3 (GOWN DISPOSABLE) ×7 IMPLANT
KIT BASIN OR (CUSTOM PROCEDURE TRAY) ×3 IMPLANT
PACK TOTAL JOINT (CUSTOM PROCEDURE TRAY) ×3 IMPLANT
PADDING CAST COTTON 6X4 STRL (CAST SUPPLIES) ×3 IMPLANT
SPONGE GAUZE 2X2 STER 10/PKG (GAUZE/BANDAGES/DRESSINGS)
SUT MNCRL AB 4-0 PS2 18 (SUTURE) ×3 IMPLANT
SUT VIC AB 1 CT1 36 (SUTURE) ×9 IMPLANT
SUT VIC AB 2-0 CT1 27 (SUTURE) ×6
SUT VIC AB 2-0 CT1 TAPERPNT 27 (SUTURE) ×2 IMPLANT
SUT VLOC 180 0 24IN GS25 (SUTURE) ×3 IMPLANT
TOWEL OR 17X26 10 PK STRL BLUE (TOWEL DISPOSABLE) ×3 IMPLANT
TOWEL OR NON WOVEN STRL DISP B (DISPOSABLE) IMPLANT
TRAY FOLEY CATH 14FRSI W/METER (CATHETERS) ×3 IMPLANT

## 2013-10-17 NOTE — Progress Notes (Signed)
Pt arrived from PACU on bed, A&Ox4. VSS.  Tele 35 confirmed w/ CCMD.  Pt oriented to callbell and environment. POC discussed.  Foley draining cl/y urine.  No c/o at present.

## 2013-10-17 NOTE — Progress Notes (Signed)
PHARMACIST - PHYSICIAN ORDER COMMUNICATION  CONCERNING: P&T Medication Policy on Herbal Medications  DESCRIPTION:  This patient's order for: Barberry Oreg Grape Goldenseal  has been noted.  This product(s) is classified as an "herbal" or natural product. Due to a lack of definitive safety studies or FDA approval, nonstandard manufacturing practices, plus the potential risk of unknown drug-drug interactions while on inpatient medications, the Pharmacy and Therapeutics Committee does not permit the use of "herbal" or natural products of this type within [facility].   ACTION TAKEN: The pharmacy department is unable to verify this order at this time and your patient has been informed of this safety policy. Please reevaluate patient's clinical condition at discharge and address if the herbal or natural product(s) should be resumed at that time.   Ralene Bathe, PharmD, BCPS 10/17/2013, 6:17 PM  Pager: [phone]

## 2013-10-17 NOTE — Op Note (Signed)
NAME:  Destiny Dennis                ACCOUNT NO.: [id number]      MEDICAL RECORD NO.: [id number]      FACILITY:  [facility]      PHYSICIAN:  Durene Romans D  DATE OF BIRTH:  16-Oct-1960     DATE OF PROCEDURE:  10/17/2013                                 OPERATIVE REPORT         PREOPERATIVE DIAGNOSIS: Left  hip osteoarthritis.      POSTOPERATIVE DIAGNOSIS:  Left hip osteoarthritis.      PROCEDURE:  Left total hip replacement through an anterior approach   utilizing DePuy THR system, component size 62mm pinnacle cup, a size 36+4 neutral   Altrex liner, a size 6 Hi Tri Lock stem with a 36+1.5 delta ceramic   ball.      SURGEON:  Madlyn Frankel. Charlann Boxer, M.D.      ASSISTANT:  Lanney Gins, PA-C     ANESTHESIA:  Spinal.      SPECIMENS:  None.      COMPLICATIONS:  None.      BLOOD LOSS:  500 cc     DRAINS:  none.      INDICATION OF THE PROCEDURE:  JACKALYNN LINDBLOM is a 53 y.o. female who had   presented to office for evaluation of left hip pain.  Radiographs revealed   progressive degenerative changes with bone-on-bone   articulation to the  hip joint.  The patient had painful limited range of   motion significantly affecting their overall quality of life.  The patient was failing to    respond to conservative measures, and at this point was ready   to proceed with more definitive measures.  The patient has noted progressive   degenerative changes in his hip, progressive problems and dysfunction   with regarding the hip prior to surgery.  Consent was obtained for   benefit of pain relief.  Specific risk of infection, DVT, component   failure, dislocation, need for revision surgery, as well discussion of   the anterior versus posterior approach were reviewed.  Consent was   obtained for benefit of anterior pain relief through an anterior   approach.      PROCEDURE IN DETAIL:  The patient was brought to operative theater.   Once adequate anesthesia,  preoperative antibiotics, 2gm Ancef administered.   The patient was positioned supine on the OSI Hanna table.  Once adequate   padding of boney process was carried out, we had predraped out the hip, and  used fluoroscopy to confirm orientation of the pelvis and position.      The left hip was then prepped and draped from proximal iliac crest to   mid thigh with shower curtain technique.      Time-out was performed identifying the patient, planned procedure, and   extremity.     An incision was then made 2 cm distal and lateral to the   anterior superior iliac spine extending over the orientation of the   tensor fascia lata muscle and sharp dissection was carried down to the   fascia of the muscle and protractor placed in the soft tissues.      The fascia was then incised.  The muscle belly was identified and swept  laterally and retractor placed along the superior neck.  Following   cauterization of the circumflex vessels and removing some pericapsular   fat, a second cobra retractor was placed on the inferior neck.  A third   retractor was placed on the anterior acetabulum after elevating the   anterior rectus.  A L-capsulotomy was along the line of the   superior neck to the trochanteric fossa, then extended proximally and   distally.  Tag sutures were placed and the retractors were then placed   intracapsular.  We then identified the trochanteric fossa and   orientation of my neck cut, confirmed this radiographically   and then made a neck osteotomy with the femur on traction.  The femoral   head was removed without difficulty or complication.  Traction was let   off and retractors were placed posterior and anterior around the   acetabulum.      The labrum and foveal tissue were debrided.  I began reaming with a 42mm   reamer and reamed up to 56mm reamer with good bony bed preparation and a 52   cup was chosen.  The final 50mm Pinnacle cup was then impacted under fluoroscopy  to  confirm the depth of penetration and orientation with respect to   abduction.  A screw was placed followed by the hole eliminator.  The final   36+4 neutral Altrex liner was impacted with good visualized rim fit.  The cup was positioned anatomically within the acetabular portion of the pelvis.      At this point, the femur was rolled at 80 degrees.  Further capsule was   released off the inferior aspect of the femoral neck.  I then   released the superior capsule proximally.  The hook was placed laterally   along the femur and elevated manually and held in position with the bed   hook.  The leg was then extended and adducted with the leg rolled to 100   degrees of external rotation.  Once the proximal femur was fully   exposed, I used a box osteotome to set orientation.  I then began   broaching with the starting chili pepper broach and passed this by hand and then broached up to 6.  With the 6 broach in place I chose a standard neck and did a trial reduction.  The offset was appropriate, leg lengths   appeared to be equal, confirmed radiographically.   Given these findings, I went ahead and dislocated the hip, repositioned all   retractors and positioned the right hip in the extended and abducted position.  The final 6 standard Tri Lock stem was   chosen and it was impacted down to the level of neck cut.  Based on this   and the trial reduction, a 36+1.5 delta ceramic ball was chosen and   impacted onto a clean and dry trunnion, and the hip was reduced.  The   hip had been irrigated throughout the case again at this point.  I did   reapproximate the superior capsular leaflet to the anterior leaflet   using #1 Vicryl.  The fascia of the   tensor fascia lata muscle was then reapproximated using #1 Vicryl and #0 V-lock sutures.  The   remaining wound was closed with 2-0 Vicryl and running 4-0 Monocryl.   The hip was cleaned, dried, and dressed sterilely using Dermabond and   Aquacel dressing.   She was then brought   to recovery room in  stable condition tolerating the procedure well.    Danae Orleans, PA-C was present for the entirety of the case involved from   preoperative positioning, perioperative retractor management, general   facilitation of the case, as well as primary wound closure as assistant.            Pietro Cassis Alvan Dame, M.D.        10/17/2013 3:22 PM

## 2013-10-17 NOTE — Anesthesia Procedure Notes (Signed)
Spinal  Patient location during procedure: OR Staffing CRNA/Resident: Argus Caraher Performed by: resident/CRNA  Preanesthetic Checklist Completed: patient identified, site marked, surgical consent, pre-op evaluation, timeout performed, IV checked, risks and benefits discussed and monitors and equipment checked Spinal Block Patient position: sitting Prep: Betadine Patient monitoring: heart rate, continuous pulse ox and blood pressure Approach: midline Location: L2-3 Injection technique: single-shot Needle Needle type: Sprotte  Needle gauge: 24 G Needle length: 9 cm Additional Notes Expiration date of kit checked and confirmed. Patient tolerated procedure well, without complications.

## 2013-10-17 NOTE — Interval H&P Note (Signed)
History and Physical Interval Note:  10/17/2013 12:26 PM  Destiny Dennis  has presented today for surgery, with the diagnosis of LEFT HIP OA  The various methods of treatment have been discussed with the patient and family. After consideration of risks, benefits and other options for treatment, the patient has consented to  Procedure(s): LEFT TOTAL HIP ARTHROPLASTY ANTERIOR APPROACH (Left) as a surgical intervention .  The patient's history has been reviewed, patient examined, no change in status, stable for surgery.  I have reviewed the patient's chart and labs.  Questions were answered to the patient's satisfaction.     Mauri Pole

## 2013-10-17 NOTE — Anesthesia Preprocedure Evaluation (Signed)
Anesthesia Evaluation  Patient identified by MRN, date of birth, ID band Patient awake    Reviewed: Allergy & Precautions, H&P , NPO status , Patient's Chart, lab work & pertinent test results  History of Anesthesia Complications (+) PONVNegative for: history of anesthetic complications  Airway Mallampati: II TM Distance: >3 FB Neck ROM: Full    Dental  (+) Teeth Intact, Dental Advisory Given   Pulmonary neg pulmonary ROS,  breath sounds clear to auscultation  Pulmonary exam normal       Cardiovascular + dysrhythmias (TEE yesterday: normal LVF, valves OK, ?pinhole VSD) Atrial Fibrillation Rhythm:Irregular Rate:Normal     Neuro/Psych  Headaches, Anxiety    GI/Hepatic Neg liver ROS, hiatal hernia, GERD-  Controlled,  Endo/Other  negative endocrine ROS  Renal/GU negative Renal ROS     Musculoskeletal  (+) Arthritis -, Osteoarthritis,    Abdominal Normal abdominal exam  (+) + obese,   Peds  Hematology negative hematology ROS (+) anemia ,   Anesthesia Other Findings   Reproductive/Obstetrics                           Anesthesia Physical  Anesthesia Plan  ASA: III  Anesthesia Plan: Spinal   Post-op Pain Management:    Induction: Intravenous  Airway Management Planned:   Additional Equipment:   Intra-op Plan:   Post-operative Plan:   Informed Consent: I have reviewed the patients History and Physical, chart, labs and discussed the procedure including the risks, benefits and alternatives for the proposed anesthesia with the patient or authorized representative who has indicated his/her understanding and acceptance.   Dental advisory given  Plan Discussed with: CRNA  Anesthesia Plan Comments:         Anesthesia Quick Evaluation

## 2013-10-17 NOTE — Anesthesia Postprocedure Evaluation (Signed)
Anesthesia Post Note  Patient: Destiny Dennis  Procedure(s) Performed: Procedure(s) (LRB): LEFT TOTAL HIP ARTHROPLASTY ANTERIOR APPROACH (Left)  Anesthesia type: Spinal  Patient location: PACU  Post pain: Pain level controlled  Post assessment: Post-op Vital signs reviewed  Last Vitals: BP 105/67  Pulse 105  Temp(Src) 36.5 C (Oral)  Resp 16  Ht 5\' 11"  (1.803 m)  Wt 260 lb (117.935 kg)  BMI 36.28 kg/m2  SpO2 100%  LMP 04/17/2012  Post vital signs: Reviewed  Level of consciousness: sedated  Complications: No apparent anesthesia complications

## 2013-10-17 NOTE — Progress Notes (Signed)
Pt HR noted to be afib 120-130s. PRN dose of lopressor 25mg  given. Will monitor for effect. Pt currently resting comfortably in bed, finishing her dinner. No c/o.

## 2013-10-17 NOTE — Transfer of Care (Signed)
Immediate Anesthesia Transfer of Care Note  Patient: Destiny Dennis  Procedure(s) Performed: Procedure(s): LEFT TOTAL HIP ARTHROPLASTY ANTERIOR APPROACH (Left)  Patient Location: PACU  Anesthesia Type:Spinal  Level of Consciousness: awake, alert  and oriented  Airway & Oxygen Therapy: Patient Spontanous Breathing and Patient connected to nasal cannula oxygen  Post-op Assessment: Report given to PACU RN and Post -op Vital signs reviewed and stable  Post vital signs: Reviewed and stable  Complications: No apparent anesthesia complications

## 2013-10-18 DIAGNOSIS — I4891 Unspecified atrial fibrillation: Secondary | ICD-10-CM

## 2013-10-18 DIAGNOSIS — Z96649 Presence of unspecified artificial hip joint: Secondary | ICD-10-CM

## 2013-10-18 LAB — BASIC METABOLIC PANEL
BUN: 12 mg/dL (ref 6–23)
CALCIUM: 8.7 mg/dL (ref 8.4–10.5)
CO2: 23 mEq/L (ref 19–32)
Chloride: 100 mEq/L (ref 96–112)
Creatinine, Ser: 0.67 mg/dL (ref 0.50–1.10)
GFR calc Af Amer: >90 mL/min (ref >90)
Glucose, Bld: 131 mg/dL — ABNORMAL HIGH (ref 70–99)
POTASSIUM: 4.7 meq/L (ref 3.7–5.3)
SODIUM: 135 meq/L — AB (ref 137–147)

## 2013-10-18 LAB — CBC
HCT: 33.9 % — ABNORMAL LOW (ref 36.0–46.0)
HEMOGLOBIN: 11 g/dL — AB (ref 12.0–15.0)
MCH: 28.2 pg (ref 26.0–34.0)
MCHC: 32.4 g/dL (ref 30.0–36.0)
MCV: 86.9 fL (ref 78.0–100.0)
PLATELETS: 285 10*3/uL (ref 150–400)
RBC: 3.9 MIL/uL (ref 3.87–5.11)
RDW: 14 % (ref 11.5–15.5)
WBC: 13.8 10*3/uL — ABNORMAL HIGH (ref 4.0–10.5)

## 2013-10-18 MED ORDER — DSS 100 MG PO CAPS: 100.0000 mg | ORAL_CAPSULE | Freq: Two times a day (BID) | ORAL | Status: DC

## 2013-10-18 MED ORDER — POLYETHYLENE GLYCOL 3350 17 G PO PACK: 17.0000 g | PACK | Freq: Two times a day (BID) | ORAL | Status: DC

## 2013-10-18 MED ORDER — RIVAROXABAN 20 MG PO TABS
20.0000 mg | ORAL_TABLET | Freq: Every day | ORAL | Status: DC
  Administered 2013-10-19 – 2013-10-22 (×4): 20 mg via ORAL
  Filled 2013-10-18 (×5): qty 1

## 2013-10-18 MED ORDER — METHOCARBAMOL 500 MG PO TABS: 500.0000 mg | ORAL_TABLET | Freq: Four times a day (QID) | ORAL | Status: DC | PRN

## 2013-10-18 MED ORDER — LABETALOL HCL 5 MG/ML IV SOLN
5.0000 mg | Freq: Once | INTRAVENOUS | Status: AC
  Administered 2013-10-18: 5 mg via INTRAVENOUS
  Filled 2013-10-18: qty 4

## 2013-10-18 MED ORDER — DEXTROSE 5 % IV SOLN
5.0000 mg/h | INTRAVENOUS | Status: AC
  Administered 2013-10-18: 5 mg/h via INTRAVENOUS
  Administered 2013-10-19: 10 mg/h via INTRAVENOUS
  Filled 2013-10-18 (×2): qty 100

## 2013-10-18 MED ORDER — DILTIAZEM LOAD VIA INFUSION
10.0000 mg | Freq: Once | INTRAVENOUS | Status: AC
  Administered 2013-10-18: 10 mg via INTRAVENOUS
  Filled 2013-10-18: qty 10

## 2013-10-18 MED ORDER — HYDROCODONE-ACETAMINOPHEN 7.5-325 MG PO TABS: 1.0000 | ORAL_TABLET | ORAL | Status: DC | PRN

## 2013-10-18 NOTE — Consult Note (Signed)
Reason for Consult: Atrial fibrillation with rapid ventricular response  Requesting Physician: Alvan Dame   Cardiologist: Allred  HPI: This is a 53 y.o. female with a past medical history significant for recurrent persistent atrial fibrillation that has persisted despite 2 previous attempts at radiofrequency ablation. Following elective total hip replacement surgery she has had atrial fibrillation. Her ventricular response has been gradually increasing throughout the day and she has felt uncomfortable, although without frank dyspnea, dizziness or angina. We have asked to help with management of the arrhythmia.  She has failed previous outpatient antiarrhythmic therapy with dronedarone and the plan was made last September for her to initiate treatment with dofetilide. She experienced a lull in symptoms and decided to forego initiation of the new antiarrhythmic. A couple of weeks before the hip surgery she did notice recurrence of her palpitations.  She has rapid ventricular response although her surgical pain appears to be reasonably well controlled. She is in no acute distress.  PMHx:  Past Medical History  Diagnosis Date  . Atrial fibrillation 03/05/2007    a. s/p RFCA 12/18/2011, 07/29/12  . 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 Sialoadenitis 03/18/2010  . PVC (premature ventricular contraction)   . History of MRSA infection 2008    superficial skin-cleared easily with doxycycline  . Allergic rhinitis   . Cervical disc disease   . Benign fundic gland polyps of stomach   . Diverticulitis     CT Scan   . Hemorrhoids   . Atrial flutter     a. s/p RFCA 12/18/2011  . Complication of anesthesia   . PONV (postoperative nausea and vomiting)   . Arthritis   . Family history of anesthesia complication     " My Uncle " unsure of type of problem  . Anxiety     when tachycardia occurs  . GERD 12/23/2009    diet related-not a problem  . Hiatal hernia     small  . Headache(784.0)      migraines occ.  Marland Kitchen Dysrhythmia     hx. Atrial fib/ flutter   Past Surgical History  Procedure Laterality Date  . Knee surgery      x 9 Left Knee  . Upper gastrointestinal endoscopy  01/30/2010    hiatal hernia, fundic gland polyps  . Shoulder surgery      Right   . Elbow surgery      Right  . Foot surgery      Right   . Cervical fusion    . Cesarean section      x 2  . Wrist surgery      Left   . Cholecystectomy    . Flexible sigmoidoscopy      1990's  . Colonoscopy  07/16/2011    diverticulosis  . Tee without cardioversion  12/17/2011    Procedure: TRANSESOPHAGEAL ECHOCARDIOGRAM (TEE);  Surgeon: Larey Dresser, MD;  Location: [facility] ENDOSCOPY;  Service: Cardiovascular;  Laterality: N/A;  . Atrial fibrillation and atrial flutter ablation  12/17/11, 07/29/12    PVI and CTI ablations by Dr Rayann Heman  . Tee without cardioversion  07/28/2012    Procedure: TRANSESOPHAGEAL ECHOCARDIOGRAM (TEE);  Surgeon: Thayer Headings, MD;  Location: [facility] ENDOSCOPY;  Service: Cardiovascular;  Laterality: N/A;  . Atrial fibrillation ablation  07/29/2012    PVI by Dr Rayann Heman (second procedure)  . Total knee arthroplasty Right 12/12/2012    Procedure: RIGHT TOTAL KNEE ARTHROPLASTY;  Surgeon: Mauri Pole, MD;  Location: [facility] ORS;  Service: Orthopedics;  Laterality: Right;  . I&d knee with poly exchange Left 12/12/2012    Procedure: LEFT KNEE EXCISION SAPHENOUS NEUROMA/OPEN SCAR DEBRIDEMENT/POLY EXCHANGE/NERVE EXCISION;  Surgeon: Mauri Pole, MD;  Location: [facility] ORS;  Service: Orthopedics;  Laterality: Left;  . Joint replacement Bilateral     4'14  . Total hip arthroplasty Left 10/17/2013    Procedure: LEFT TOTAL HIP ARTHROPLASTY ANTERIOR APPROACH;  Surgeon: Mauri Pole, MD;  Location: [facility] ORS;  Service: Orthopedics;  Laterality: Left;    FAMHx: Family History  Problem Relation Age of Onset  . Diabetes Maternal Grandfather   . Colon cancer Neg Hx   . Diverticulitis Brother     x 2  . Diverticulitis  Paternal Grandmother     SOCHx:  reports that she has never smoked. She has never used smokeless tobacco. She reports that she drinks alcohol. She reports that she does not use illicit drugs.  ALLERGIES: Allergies  Allergen Reactions  . Avelox [Moxifloxacin Hcl In Nacl] Other (See Comments)    Numbness & tingling Peripheral neuropathy    ROS: The patient specifically denies any chest pain at rest or with exertion, dyspnea at rest or with exertion, orthopnea, paroxysmal nocturnal dyspnea, syncope, focal neurological deficits, intermittent claudication, lower extremity edema, unexplained weight gain, cough, hemoptysis or wheezing.  The patient also denies abdominal pain, nausea, vomiting, dysphagia, diarrhea, constipation, polyuria, polydipsia, dysuria, hematuria, frequency, urgency, abnormal bleeding or bruising, fever, chills, unexpected weight changes, mood swings, change in skin or hair texture, change in voice quality, auditory or visual problems, allergic reactions or rashes, new musculoskeletal complaints other than usual "aches and pains".   HOME MEDICATIONS: Prescriptions prior to admission  Medication Sig Dispense Refill  . Barberry-Oreg Grape-Goldenseal (BERBERINE COMPLEX PO) Take 1 tablet by mouth 2 (two) times daily.      . Cholecalciferol (VITAMIN D3) 2000 UNITS capsule Take 4,000 Units by mouth daily.      . diazepam (VALIUM) 5 MG tablet Take 5 mg by mouth at bedtime.      . fish oil-omega-3 fatty acids 1000 MG capsule Take 2 g by mouth daily.      . metoprolol succinate (TOPROL-XL) 100 MG 24 hr tablet Take 100 mg by mouth every morning. Take with or immediately following a meal.      . metoprolol tartrate (LOPRESSOR) 25 MG tablet Take 25-50 mg by mouth every 6 (six) hours as needed (A-FIB).      . Multiple Vitamin (MULITIVITAMIN WITH MINERALS) TABS Take 1 tablet by mouth daily.      . [DISCONTINUED] acetaminophen (TYLENOL) 500 MG tablet Take 1,000 mg by mouth every 6 (six)  hours as needed for mild pain or moderate pain.      . pantoprazole (PROTONIX) 40 MG tablet Take 40 mg by mouth daily as needed (heartburn).      . Rivaroxaban (XARELTO) 20 MG TABS tablet Take 20 mg by mouth every morning.        HOSPITAL MEDICATIONS: I have reviewed the patient's current medications. Prior to Admission:  Prescriptions prior to admission  Medication Sig Dispense Refill  . Barberry-Oreg Grape-Goldenseal (BERBERINE COMPLEX PO) Take 1 tablet by mouth 2 (two) times daily.      . Cholecalciferol (VITAMIN D3) 2000 UNITS capsule Take 4,000 Units by mouth daily.      . diazepam (VALIUM) 5 MG tablet Take 5 mg by mouth at bedtime.      . fish oil-omega-3 fatty acids  1000 MG capsule Take 2 g by mouth daily.      . metoprolol succinate (TOPROL-XL) 100 MG 24 hr tablet Take 100 mg by mouth every morning. Take with or immediately following a meal.      . metoprolol tartrate (LOPRESSOR) 25 MG tablet Take 25-50 mg by mouth every 6 (six) hours as needed (A-FIB).      . Multiple Vitamin (MULITIVITAMIN WITH MINERALS) TABS Take 1 tablet by mouth daily.      . [DISCONTINUED] acetaminophen (TYLENOL) 500 MG tablet Take 1,000 mg by mouth every 6 (six) hours as needed for mild pain or moderate pain.      . pantoprazole (PROTONIX) 40 MG tablet Take 40 mg by mouth daily as needed (heartburn).      . Rivaroxaban (XARELTO) 20 MG TABS tablet Take 20 mg by mouth every morning.       Scheduled: . cholecalciferol  4,000 Units Oral Daily  . diazepam  5 mg Oral QHS  . docusate sodium  100 mg Oral BID  . HYDROcodone-acetaminophen  1 tablet Oral 4 times per day  . metoprolol succinate  100 mg Oral q morning - 10a  . multivitamin with minerals  1 tablet Oral Daily  . omega-3 acid ethyl esters  2 g Oral Daily  . polyethylene glycol  17 g Oral BID  . [START ON 10/19/2013] rivaroxaban  20 mg Oral Q breakfast    VITALS: Blood pressure 111/59, pulse 103, temperature 97.8 F (36.6 C), temperature source Oral,  resp. rate 18, height 5\' 11"  (1.803 m), weight 117.935 kg (260 lb), last menstrual period 04/17/2012, SpO2 100.00%.  PHYSICAL EXAM:  General: Alert, oriented x3, no distress Head: no evidence of trauma, PERRL, EOMI, no exophtalmos or lid lag, no myxedema, no xanthelasma; normal ears, nose and oropharynx Neck: Normal jugular venous pulsations and no hepatojugular reflux; brisk carotid pulses without delay and no carotid bruits Chest: clear to auscultation, no signs of consolidation by percussion or palpation, normal fremitus, symmetrical and full respiratory excursions Cardiovascular: normal position and quality of the apical impulse, rapid irregular rhythm, normal first heart sound and normal second heart sound, no rubs or gallops, no murmur Abdomen: no tenderness or distention, no masses by palpation, no abnormal pulsatility or arterial bruits, normal bowel sounds, no hepatosplenomegaly Extremities: no clubbing, cyanosis;  no edema; 2+ radial, ulnar and brachial pulses bilaterally; 2+ right femoral, posterior tibial and dorsalis pedis pulses; 2+ left femoral, posterior tibial and dorsalis pedis pulses; no subclavian or femoral bruits Neurological: grossly nonfocal   LABS  CBC  Recent Labs  10/16/13 1420 10/18/13 0435  WBC 7.0 13.8*  HGB 12.8 11.0*  HCT 37.4 33.9*  MCV 85.4 86.9  PLT 296 [id number]   Basic Metabolic Panel  Recent Labs  10/16/13 1420 10/18/13 0435  NA 139 135*  K 4.3 4.7  CL 101 100  CO2 24 23  GLUCOSE 95 131*  BUN 14 12  CREATININE 0.79 0.67  CALCIUM 9.2 8.7      IMAGING: Dg Pelvis Portable  10/17/2013   CLINICAL DATA:  Left hip replacement  EXAM: PORTABLE PELVIS 1-2 VIEWS  COMPARISON:  None.  FINDINGS: Left hip replacement in satisfactory position and alignment. No fracture or complication.  IMPRESSION: Satisfactory left hip replacement.   Electronically Signed   By: Franchot Gallo M.D.   On: 10/17/2013 16:18   Dg Hip Portable 1 View Left  10/17/2013    CLINICAL DATA:  Left hip replacement  EXAM: PORTABLE  LEFT HIP - 1 VIEW  COMPARISON:  None.  FINDINGS: Lateral view. Satisfactory position alignment of left hip replacement.  IMPRESSION: Satisfactory left hip replacement, lateral view   Electronically Signed   By: Franchot Gallo M.D.   On: 10/17/2013 16:18   Dg C-arm 1-60 Min-no Report  10/17/2013   CLINICAL DATA: intraop   C-ARM 1-60 MINUTES  Fluoroscopy was utilized by the requesting physician.  No radiographic  interpretation.     ECG: Atrial fibrillation with a rapid ventricular response; QTC is a little difficult to measure but is probably around 420 ms  TELEMETRY:  same  IMPRESSION: 1. It is likely that she has increased ventricular rate secondary to the postoperative hyperadrenergic state, even though she has well-controlled pain.  RECOMMENDATION: 1. Add intravenous diltiazem for better ventricular rate control. Transition this to oral medications in the morning. 2. She would now like to start treatment with dofetilide and asks whether we could arrange for a 72 hour hospitalization in direct continuation of the current hip surgery. We'll discuss this option with Dr. Rayann Heman tomorrow. Her potassium level and renal function are acceptable. We'll check a magnesium and screen her medication list for potential interactions.  Time Spent Directly with Patient: 45 minutes  Sanda Klein, MD, [facility] [phone] office [phone] pager   10/18/2013, 9:41 PM

## 2013-10-18 NOTE — Progress Notes (Signed)
Spoke with pt concerning Home Health, pt selected [facility] for [facility]. Referral given to in house rep.

## 2013-10-18 NOTE — Progress Notes (Signed)
Patient HR was 130-160's on assessment. RN previous gave 50mg  metoprolol PO with no significant change. Paged Dr. Alvan Dame and received orders for 5mg  IV Labetolol to be give now and if HR still elevated to give a second dose at 1 hour mark. Gave first dose. BP 120/72 and HR of 156. Will continue to monitor.

## 2013-10-18 NOTE — Progress Notes (Signed)
   Subjective: 1 Day Post-Op Procedure(s) (LRB): LEFT TOTAL HIP ARTHROPLASTY ANTERIOR APPROACH (Left)   Patient reports pain as mild, pain controlled. Known a-fib for about 3 weeks, otherwise no events throughout the night. States that her cardiologist is aware of her being in a-fib.  She knows how to control her rate while at home, otherwise she will follow up with her cardiologist as they have discussed.  Ready to be discharged home if she does well with PT and pain stays controlled.   Objective:   VITALS:   Filed Vitals:   10/18/13 0608  BP: 90/69  Pulse: 102  Temp: 97.8 F (36.6 C)  Resp: 18    Neurovascular intact Dorsiflexion/Plantar flexion intact Incision: dressing C/D/I No cellulitis present Compartment soft  LABS  Recent Labs  10/16/13 1420 10/18/13 0435  HGB 12.8 11.0*  HCT 37.4 33.9*  WBC 7.0 13.8*  PLT 296 285     Recent Labs  10/16/13 1420 10/18/13 0435  NA 139 135*  K 4.3 4.7  BUN 14 12  CREATININE 0.79 0.67  GLUCOSE 95 131*     Assessment/Plan: 1 Day Post-Op Procedure(s) (LRB): LEFT TOTAL HIP ARTHROPLASTY ANTERIOR APPROACH (Left) Foley cath d/c'ed Advance diet Up with therapy D/C IV fluids Discharge home with home health Follow up in 2 weeks at [facility]. Follow up with OLIN,Emine Lopata D in 2 weeks.  Contact information:  [facility] [address] [place] [phone]    Expected ABLA  Treated with iron and will observe  Obese (BMI 30-39.9) Estimated body mass index is 36.28 kg/(m^2) as calculated from the following:   Height as of this encounter: 5\' 11"  (1.803 m).   Weight as of this encounter: 117.935 kg (260 lb). Patient also counseled that weight may inhibit the healing process Patient counseled that losing weight will help with future health issues       West Pugh. Avagrace Botelho   PAC  10/18/2013, 7:59 AM

## 2013-10-18 NOTE — Progress Notes (Signed)
Cardizem drip started. BP monitored q 15 minute for 1 hour. Then q 30 min for 1 hour. Then Q 4 vitals.

## 2013-10-18 NOTE — Progress Notes (Signed)
OT Cancellation Note  Patient Details Name: Destiny Dennis MRN: [id number] DOB: 11/16/1960   Cancelled Treatment:    Reason Eval/Treat Not Completed: OT screened, no needs identified, will sign off.  Pt had has multiple knee surgeries and didn't feel she needed OT at this time.  She has assist for adls and DME  Jaymie Mckiddy 10/18/2013, 1:32 PM Lesle Chris, OTR/L [phone] 10/18/2013

## 2013-10-18 NOTE — Evaluation (Signed)
Physical Therapy Evaluation Patient Details Name: Destiny Dennis MRN: [id number] DOB: October 29, 1960 Today's Date: 10/18/2013 Time: 8185-6314 PT Time Calculation (min): 19 min  PT Assessment / Plan / Recommendation History of Present Illness  53 yo female s/p L THA-DA 10/17/13. Hx of multiple knee surgeries.   Clinical Impression  On eval, pt required Min assist for mobility-able to ambulate ~75 feet with walker. HR 130s with activity. Recommend HHPT at discharge. Will plan to see for a 2nd session to practice steps.     PT Assessment  Patient needs continued PT services    Follow Up Recommendations  Home health PT    Does the patient have the potential to tolerate intense rehabilitation      Barriers to Discharge        Equipment Recommendations  None recommended by PT    Recommendations for Other Services     Frequency 7X/week    Precautions / Restrictions Precautions Precautions: None Restrictions Weight Bearing Restrictions: Yes LLE Weight Bearing: Weight bearing as tolerated   Pertinent Vitals/Pain 5/10 L LE. Ice applied end of session      Mobility  Bed Mobility General bed mobility comments: pt sitting EOB Transfers Overall transfer level: Needs assistance Transfers: Sit to/from Stand Sit to Stand: Min assist General transfer comment: Assist to rise, stabilize. VCs safety, hand placement Ambulation/Gait Ambulation/Gait assistance: Min guard Ambulation Distance (Feet): 75 Feet Assistive device: Rolling walker (2 wheeled) Gait Pattern/deviations: Step-to pattern;Antalgic;Decreased stride length General Gait Details: slow gait speed. VCs safety, sequence. fatigues fairly easily. HR 130s during ambulation.     Exercises     PT Diagnosis: Difficulty walking;Acute pain;Abnormality of gait  PT Problem List: Decreased strength;Decreased range of motion;Decreased activity tolerance;Decreased mobility;Pain;Decreased knowledge of use of DME PT Treatment  Interventions: DME instruction;Gait training;Stair training;Functional mobility training;Therapeutic activities;Therapeutic exercise;Patient/family education     PT Goals(Current goals can be found in the care plan section) Acute Rehab PT Goals Patient Stated Goal: less pain. regain independence PT Goal Formulation: With patient/family Time For Goal Achievement: 10/25/13 Potential to Achieve Goals: Good  Visit Information  Last PT Received On: 10/18/13 Assistance Needed: +1 History of Present Illness: 53 yo female s/p L THA-DA 10/17/13. Hx of multiple knee surgeries.        Prior [place] expects to be discharged to:: Private residence Living Arrangements: Spouse/significant other Type of Home: House Home Access: Stairs to enter Technical brewer of Steps: 4 Entrance Stairs-Rails: None Home Layout: Two level;1/2 bath on main level Home Equipment: Crutches Additional Comments: thinks she may still have 3n1 Communication Communication: No difficulties    Cognition  Cognition Arousal/Alertness: Awake/alert Behavior During Therapy: WFL for tasks assessed/performed Overall Cognitive Status: Within Functional Limits for tasks assessed    Extremity/Trunk Assessment Upper Extremity Assessment Upper Extremity Assessment: Overall WFL for tasks assessed Lower Extremity Assessment Lower Extremity Assessment: LLE deficits/detail LLE Deficits / Details: hip flex at least 2/5, moves ankle well Cervical / Trunk Assessment Cervical / Trunk Assessment: Normal   Balance    End of Session PT - End of Session Activity Tolerance: Patient limited by fatigue;Patient limited by pain Patient left: in chair;with call bell/phone within reach;with family/visitor present  GP     Weston Anna, MPT Pager: [phone]

## 2013-10-18 NOTE — Progress Notes (Addendum)
Physical Therapy Treatment Patient Details Name: Destiny Dennis MRN: [id number] DOB: Mar 30, 1961 Today's Date: 10/18/2013 Time: 2440-1027 PT Time Calculation (min): 50 min  PT Assessment / Plan / Recommendation  History of Present Illness 53 yo female s/p L THA-DA 10/17/13. Hx of multiple knee surgeries.    PT Comments   Progressing with mobility. Limited by fatigue, pain this session. Pt did not feel well enough to practice steps on today and requested to remain in hospital one more night. RN made aware. Will plan to practice steps in am (pt has 4 steps to enter home-no rail, and 1 flight up to bedroom).   Follow Up Recommendations  Home health PT     Does the patient have the potential to tolerate intense rehabilitation     Barriers to Discharge        Equipment Recommendations  None recommended by PT    Recommendations for Other Services    Frequency 7X/week   Progress towards PT Goals Progress towards PT goals: Progressing toward goals  Plan Current plan remains appropriate    Precautions / Restrictions Precautions Precautions: None Restrictions Weight Bearing Restrictions: Yes LLE Weight Bearing: Weight bearing as tolerated   Pertinent Vitals/Pain 6/10 L anterior hip, thigh area. Requested muscle relaxer from RN.  HR 118 at rest HR 150s while ambulating with PT-has history of AFIB    Mobility  Bed Mobility Overal bed mobility: Needs Assistance Bed Mobility: Sit to Supine Sit to supine: Min assist General bed mobility comments: Assist for L LE onto bed.  Transfers Overall transfer level: Needs assistance Transfers: Sit to/from Stand Sit to Stand: Min guard General transfer comment: close guard for safety Ambulation/Gait Ambulation/Gait assistance: Min guard Ambulation Distance (Feet): 150 Feet Assistive device: Rolling walker (2 wheeled) Gait Pattern/deviations: Decreased stride length;Antalgic;Decreased step length - left General Gait Details: slow gait speed.  VCs safety, sequence. fatigues fairly easily. HR 150s during ambulation-pt on monitor. Fatigues fairly easily.     Exercises Total Joint Exercises Ankle Circles/Pumps: AROM;Both;15 reps;Supine Quad Sets: AROM;Both;15 reps;Supine Heel Slides: AAROM;Left;15 reps;Supine Hip ABduction/ADduction: AAROM;Left;15 reps;Supine   PT Diagnosis: Difficulty walking;Acute pain;Abnormality of gait  PT Problem List: Decreased strength;Decreased range of motion;Decreased activity tolerance;Decreased mobility;Pain;Decreased knowledge of use of DME PT Treatment Interventions: DME instruction;Gait training;Stair training;Functional mobility training;Therapeutic activities;Therapeutic exercise;Patient/family education   PT Goals (current goals can now be found in the care plan section) Acute Rehab PT Goals Patient Stated Goal: less pain. regain independence PT Goal Formulation: With patient/family Time For Goal Achievement: 10/25/13 Potential to Achieve Goals: Good  Visit Information  Last PT Received On: 10/18/13 Assistance Needed: +1 History of Present Illness: 53 yo female s/p L THA-DA 10/17/13. Hx of multiple knee surgeries.     Subjective Data  Patient Stated Goal: less pain. regain independence   Cognition  Cognition Arousal/Alertness: Awake/alert Behavior During Therapy: WFL for tasks assessed/performed Overall Cognitive Status: Within Functional Limits for tasks assessed    Balance     End of Session PT - End of Session Equipment Utilized During Treatment: Gait belt Activity Tolerance: Patient limited by fatigue;Patient limited by pain Patient left: in bed;with call bell/phone within reach Nurse Communication: Patient requests pain meds (pt wishes to remain in hosptial one more day-not feeling well enouogh to practice steps today)   GP     Weston Anna, MPT Pager: [phone]

## 2013-10-19 DIAGNOSIS — Z96659 Presence of unspecified artificial knee joint: Secondary | ICD-10-CM

## 2013-10-19 DIAGNOSIS — Z7901 Long term (current) use of anticoagulants: Secondary | ICD-10-CM

## 2013-10-19 DIAGNOSIS — I4892 Unspecified atrial flutter: Secondary | ICD-10-CM

## 2013-10-19 LAB — MAGNESIUM: MAGNESIUM: 1.8 mg/dL (ref 1.5–2.5)

## 2013-10-19 MED ORDER — DILTIAZEM HCL 90 MG PO TABS
90.0000 mg | ORAL_TABLET | Freq: Three times a day (TID) | ORAL | Status: DC
  Administered 2013-10-19 (×2): 90 mg via ORAL
  Filled 2013-10-19 (×5): qty 1

## 2013-10-19 NOTE — Progress Notes (Signed)
   Subjective: 2 Days Post-Op Procedure(s) (LRB): LEFT TOTAL HIP ARTHROPLASTY ANTERIOR APPROACH (Left)   Patient reports pain as mild, pain controlled. A-fib events continued throughout the night, Cardiology on board.   Objective:   VITALS:   Filed Vitals:   10/19/13  BP: 102/64  Pulse: 101  Temp: 98.9 F (37.2 C)   Resp: 18    Neurovascular intact Dorsiflexion/Plantar flexion intact Incision: dressing C/D/I No cellulitis present Compartment soft  LABS  Recent Labs  10/16/13 1420 10/18/13 0435  HGB 12.8 11.0*  HCT 37.4 33.9*  WBC 7.0 13.8*  PLT 296 285     Recent Labs  10/16/13 1420 10/18/13 0435  NA 139 135*  K 4.3 4.7  BUN 14 12  CREATININE 0.79 0.67  GLUCOSE 95 131*     Assessment/Plan: 2 Days Post-Op Procedure(s) (LRB): LEFT TOTAL HIP ARTHROPLASTY ANTERIOR APPROACH (Left) Awaiting cardiology to try oral meds this morning. Any changes in a-fib medication per cardiology. Up with therapy Discharge home with home health when ready Orthopaedically stable [facility]. Follow up with OLIN,Samera Macy D in 2 weeks.  Contact information:  [facility] [address] [place] Jameela Michna   PAC  10/19/2013, 8:39 AM

## 2013-10-19 NOTE — Progress Notes (Signed)
Physical Therapy Treatment Patient Details Name: Destiny Dennis MRN: [id number] DOB: [birth year] Today's Date: 10/19/2013 Time: 1415-1440 PT Time Calculation (min): 25 min  PT Assessment / Plan / Recommendation  History of Present Illness 53 yo female 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 Hx of multiple knee surgeries.    PT Comments   Pt ambulated in hallway however became nauseated and dizzy (pt believes this is from switching to PO cardizem) but able to continue after short seated rest break.  Pt also performed a couple exercises in bed.  Pt reports soreness in L hip and ice pack applied end of session.  Pt aware she will need to practice stairs prior to d/c.   Follow Up Recommendations  Home health PT     Does the patient have the potential to tolerate intense rehabilitation     Barriers to Discharge        Equipment Recommendations  None recommended by PT    Recommendations for Other Services    Frequency 7X/week   Progress towards PT Goals Progress towards PT goals: Progressing toward goals  Plan Current plan remains appropriate    Precautions / Restrictions Precautions Precautions: None Precaution Comments: monitor vitals Restrictions LLE Weight Bearing: Weight bearing as tolerated   Pertinent Vitals/Pain See below    Mobility  Bed Mobility Overal bed mobility: Needs Assistance Bed Mobility: Sit to Supine;Supine to Sit Supine to sit: Min assist Sit to supine: Min assist General bed mobility comments: Assist for L LE  Transfers Overall transfer level: Needs assistance Equipment used: Rolling walker (2 wheeled) Transfers: Sit to/from Stand Sit to Stand: Min guard General transfer comment: close guard for safety Ambulation/Gait Ambulation/Gait assistance: Min guard Ambulation Distance (Feet): 90 Feet (total) Assistive device: Rolling walker (2 wheeled) Gait Pattern/deviations: Step-to pattern;Antalgic;Decreased stance time - left;Decreased step  length - left General Gait Details: slow gait speed. verbal cues for step length, hip/knee flexion, and heel strike; pt with dizziness and nausea after 25 feet so chair brought for pt to sit (Vitals: 116/106mmHg and 95bpm) pt able to ambulate another 65 feet then assisted back to bed, HR 126 during ambulation    Exercises Total Joint Exercises Ankle Circles/Pumps: AROM;Both;15 reps Short Arc Quad: AROM;Left;15 reps;Supine Hip ABduction/ADduction: AAROM;Left;15 reps;Supine   PT Diagnosis:    PT Problem List:   PT Treatment Interventions:     PT Goals (current goals can now be found in the care plan section)    Visit Information  Last PT Received On: 10/19/13 Assistance Needed: +1 History of Present Illness: 53 yo female 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 Hx of multiple knee surgeries.     Subjective Data      Cognition  Cognition Arousal/Alertness: Awake/alert Behavior During Therapy: WFL for tasks assessed/performed Overall Cognitive Status: Within Functional Limits for tasks assessed    Balance     End of Session PT - End of Session Activity Tolerance: Patient limited by fatigue;Patient limited by pain Patient left: in bed;with call bell/phone within reach;with family/visitor present Nurse Communication:  (aware of vitals during ambulation, to bring muscle relaxer)   GP     Breana Litts,[name] 10/19/2013, 4:01 PM Carmelia Bake, PT, DPT 10/19/2013 Pager: [phone]

## 2013-10-19 NOTE — Progress Notes (Signed)
Increased cardizem drip to 10 mg/hr, as patient's HR was still not consistently in target range. HR ranging from high 90s-120s. BP stable at 128/53. Will continue to monitor patient.

## 2013-10-19 NOTE — Progress Notes (Addendum)
       Patient Name: Destiny Dennis Date of Encounter: 10/19/2013    SUBJECTIVE:She was seen on rounds this morning. She is adverse to the idea of chronic antiarrhythmic therapy. When her heart rate is rapid she is symptomatic. When the rate is controlled she doesn't realize that she is in atrial fib.  I have spoken with Dr. Rayann Heman, her electrophysiologist. He feels the best approach is rate control, oral anticoagulation, and further discussion with the patient concerning antiarrhythmic therapy/rhythm control after she is out of the hospital.  TELEMETRY:  Atrial fibrillation with poor rate control on IV diltiazem Filed Vitals:   10/19/13 0748 10/19/13 1200 10/19/13 1403 10/19/13 1600  BP:   110/58   Pulse:   100   Temp:   98.3 F (36.8 C)   TempSrc:   Oral   Resp: 18 18 18 18   Height:      Weight:      SpO2:   99%     Intake/Output Summary (Last 24 hours) at 10/19/13 1803 Last data filed at 10/19/13 1500  Gross per 24 hour  Intake 2156.08 ml  Output      0 ml  Net 2156.08 ml    LABS: Basic Metabolic Panel:  Recent Labs  10/18/13 0435 10/19/13 0403  NA 135*  --   K 4.7  --   CL 100  --   CO2 23  --   GLUCOSE 131*  --   BUN 12  --   CREATININE 0.67  --   CALCIUM 8.7  --   MG  --  1.8   CBC:  Recent Labs  10/18/13 0435  WBC 13.8*  HGB 11.0*  HCT 33.9*  MCV 86.9  PLT 285     Radiology/Studies:  No new data  Physical Exam: Blood pressure 110/58, pulse 100, temperature 98.3 F (36.8 C), temperature source Oral, resp. rate 18, height 5\' 11"  (1.803 m), weight 260 lb (117.935 kg), last menstrual period 04/17/2012, SpO2 99.00%. Weight change:    Rapid irregular rate and rhythm  Chest is clear.  No edema  ASSESSMENT:  1. Paroxysmal atrial fibrillation/recurrent atrial fibrillation in this patient who is status post ablation on 2 prior circumstances. The current atrial fibrillation is possibly precipitated by the stress of  anesthesia/surgery/hospitalization.  Plan:  1. Rate control. 2. Long-term management of atrial fibrillation, i.e. Rhythm versus rate control will be go with after discharge by Dr. Rayann Heman 3. Resume full anticoagulation as soon as possible (actually she is already back on Xarelto 20 mg daily)  Signed, Sinclair Grooms 10/19/2013, 6:03 PM

## 2013-10-20 MED ORDER — DIGOXIN 125 MCG PO TABS
0.1250 mg | ORAL_TABLET | Freq: Every day | ORAL | Status: DC
  Administered 2013-10-20: 0.125 mg via ORAL
  Filled 2013-10-20 (×2): qty 1

## 2013-10-20 MED ORDER — VITAMINS A & D EX OINT
TOPICAL_OINTMENT | CUTANEOUS | Status: AC
  Administered 2013-10-20: 5
  Filled 2013-10-20: qty 5

## 2013-10-20 MED ORDER — OXYCODONE HCL 5 MG PO TABS
5.0000 mg | ORAL_TABLET | ORAL | Status: DC
  Administered 2013-10-20 (×4): 5 mg via ORAL
  Administered 2013-10-21 (×4): 10 mg via ORAL
  Administered 2013-10-21: 5 mg via ORAL
  Administered 2013-10-21 – 2013-10-22 (×3): 10 mg via ORAL
  Administered 2013-10-22 (×2): 5 mg via ORAL
  Administered 2013-10-22: 10 mg via ORAL
  Administered 2013-10-23 (×2): 5 mg via ORAL
  Administered 2013-10-23: 10 mg via ORAL
  Administered 2013-10-23 (×2): 5 mg via ORAL
  Administered 2013-10-23 – 2013-10-24 (×6): 10 mg via ORAL
  Filled 2013-10-20 (×2): qty 2
  Filled 2013-10-20 (×2): qty 1
  Filled 2013-10-20: qty 2
  Filled 2013-10-20: qty 1
  Filled 2013-10-20 (×2): qty 2
  Filled 2013-10-20: qty 1
  Filled 2013-10-20 (×10): qty 2
  Filled 2013-10-20 (×2): qty 1
  Filled 2013-10-20 (×5): qty 2

## 2013-10-20 MED ORDER — DILTIAZEM HCL ER COATED BEADS 240 MG PO CP24
240.0000 mg | ORAL_CAPSULE | Freq: Every day | ORAL | Status: DC
  Administered 2013-10-20 – 2013-10-24 (×5): 240 mg via ORAL
  Filled 2013-10-20 (×5): qty 1

## 2013-10-20 MED ORDER — METHOCARBAMOL 500 MG PO TABS: 500.0000 mg | ORAL_TABLET | Freq: Four times a day (QID) | ORAL | Status: DC | PRN

## 2013-10-20 MED ORDER — OXYCODONE HCL 5 MG PO TABS: 5.0000 mg | ORAL_TABLET | ORAL | Status: DC | PRN

## 2013-10-20 NOTE — Progress Notes (Addendum)
Another dose 25 mg PO Lopressor given at 0638 as patient's HR sustaining in the 140s. Notified cardiology, said they would tell the rounding physician. Will continue to monitor patient.

## 2013-10-20 NOTE — Progress Notes (Addendum)
       Patient Name: Destiny Dennis Date of Encounter: 10/20/2013    SUBJECTIVE: Still in A Fib with only moderate rate control and mildly symptomatic.  TELEMETRY:  A fib with poor control Filed Vitals:   10/19/13 1403 10/19/13 1600 10/19/13 2131 10/20/13 0516  BP: 110/58  121/62 114/55  Pulse: 100  105 136  Temp: 98.3 F (36.8 C)  98 F (36.7 C) 98.1 F (36.7 C)  TempSrc: Oral  Oral Oral  Resp: 18 18 18 18   Height:      Weight:      SpO2: 99%  98% 100%    Intake/Output Summary (Last 24 hours) at 10/20/13 0924 Last data filed at 10/20/13 0700  Gross per 24 hour  Intake 2359.33 ml  Output      0 ml  Net 2359.33 ml    LABS: Basic Metabolic Panel:  Recent Labs  10/18/13 0435 10/19/13 0403  NA 135*  --   K 4.7  --   CL 100  --   CO2 23  --   GLUCOSE 131*  --   BUN 12  --   CREATININE 0.67  --   CALCIUM 8.7  --   MG  --  1.8   CBC:  Recent Labs  10/18/13 0435  WBC 13.8*  HGB 11.0*  HCT 33.9*  MCV 86.9  PLT 285     Radiology/Studies:  No new data  Physical Exam: Blood pressure 114/55, pulse 136, temperature 98.1 F (36.7 C), temperature source Oral, resp. rate 18, height 5\' 11"  (1.803 m), weight 260 lb (117.935 kg), last menstrual period 04/17/2012, SpO2 100.00%. Weight change:    IIRR  ASSESSMENT:  1. Persisting atrial fib now 48-72 hours in duration 2. Anticoagulation therapy interruption for surgery was  ~ 36 hours and prior to recurrent A fib although she feels as though she has intermittent AF as OP.  Plan:  1. Rate control and deal with rhythm control as OP per Dr. Rayann Heman. Will be ready for d/c when rate controlled on oral meds. 2. ? Home tomorrow. Demetrios Isaacs 10/20/2013, 9:24 AM

## 2013-10-20 NOTE — Progress Notes (Signed)
Patient was resting comfortably in bed and watching tv when I went in.  Her husband was at the bedside.  Patient tolerated morning meds well.  She ordered Raisin Bran and grapes for breakfast but didn't eat much of it.  Patient got up out of bed with her walker and used the bathroom twice.  The second time to the bathroom, the patient was able to pass some gas.  Patient sat on the side of the bed to bathe herself and put some makeup on.  She ordered some lunch, but stated she "wasn't really that hungry".  The bed was in the lowest position, bedside table and call light were within reach.

## 2013-10-20 NOTE — Progress Notes (Signed)
   Subjective: 3 Days Post-Op Procedure(s) (LRB): LEFT TOTAL HIP ARTHROPLASTY ANTERIOR APPROACH (Left)   Patient reports pain as moderate, with motion and mild at rest. She has had oxycodone in the past which has helped her a little more than hydrocodone. Would like to try to see if that might help her more while she is doing PT. Orthopaedically her left hip is stable. She continues to be in a-fib, which cardio is working to control her rate.  Objective:   VITALS:   Filed Vitals:   10/20/13 0516  BP: 114/55  Pulse: 136  Temp: 98.1 F (36.7 C)  Resp: 18    Neurovascular intact Dorsiflexion/Plantar flexion intact Incision: dressing C/D/I No cellulitis present Compartment soft  LABS  Recent Labs  10/18/13 0435  HGB 11.0*  HCT 33.9*  WBC 13.8*  PLT 285     Recent Labs  10/18/13 0435  NA 135*  K 4.7  BUN 12  CREATININE 0.67  GLUCOSE 131*     Assessment/Plan: 3 Days Post-Op Procedure(s) (LRB): LEFT TOTAL HIP ARTHROPLASTY ANTERIOR APPROACH (Left)  Awaiting Cardio update to plan / medications. Any changes in a-fib medication per cardiology. Up with therapy Discharge home with home health when ready Orthopaedically stable. Follow up in 2 weeks at [facility]. Follow up with OLIN,Kainon Varady D in 2 weeks.  Contact information:  [facility] [address] [place]   PAC  10/20/2013, 8:05 AM

## 2013-10-20 NOTE — Progress Notes (Signed)
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 Hx of multiple knee surgeries.    PT Comments   Progressing with mobility. HR still increased with activity-160 bpm during walk today. Plan is for pt to d/c home on tomorrow-per cardiology. Will attempt stair negotiation this afternoon.   Follow Up Recommendations  Home health PT     Does the patient have the potential to tolerate intense rehabilitation     Barriers to Discharge        Equipment Recommendations  None recommended by PT    Recommendations for Other Services    Frequency 7X/week   Progress towards PT Goals Progress towards PT goals: Progressing toward goals  Plan Current plan remains appropriate    Precautions / Restrictions Precautions Precautions: None Precaution Comments: A fib; monitor HR Restrictions Weight Bearing Restrictions: Yes LLE Weight Bearing: Weight bearing as tolerated   Pertinent Vitals/Pain 7/10 L hip. Nursing made aware.     Mobility  Bed Mobility General bed mobility comments: pt sitting EOB Transfers Overall transfer level: Needs assistance Transfers: Sit to/from Stand Sit to Stand: Min guard General transfer comment: close guard for safety Ambulation/Gait Ambulation/Gait assistance: Min guard Ambulation Distance (Feet): 150 Feet Assistive device: Rolling walker (2 wheeled) Gait Pattern/deviations: Antalgic;Decreased stance time - left;Decreased step length - left;Step-to pattern;Step-through pattern General Gait Details: slow gait speed. 1 brief standing rest break. HR 160 bpm while ambulating.     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 Hx of multiple knee surgeries.     Subjective Data      Cognition  Cognition Arousal/Alertness: Awake/alert Behavior During Therapy: WFL for tasks assessed/performed Overall Cognitive Status: Within Functional Limits for tasks assessed    Balance     End of Session PT - End of Session Activity Tolerance: Patient limited by fatigue;Patient limited by pain Patient left: in bed;with call bell/phone within reach   GP     Weston Anna, MPT Pager: [phone]

## 2013-10-20 NOTE — Progress Notes (Signed)
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 Hx of multiple knee surgeries.    PT Comments   Progressing with mobility HR 160 bpm during session. Pt able to tolerate negotiating stairs. Plan is for d/c home tomorrow per cardio possibly. May need to practice steps one more time-has 4 to enter home (no rails). Practiced with 1 crutch, 1 rail already.   Follow Up Recommendations  Home health PT     Does the patient have the potential to tolerate intense rehabilitation     Barriers to Discharge        Equipment Recommendations  None recommended by PT    Recommendations for Other Services    Frequency 7X/week   Progress towards PT Goals Progress towards PT goals: Progressing toward goals  Plan Current plan remains appropriate    Precautions / Restrictions Precautions Precautions: None Precaution Comments: A fib; monitor HR Restrictions Weight Bearing Restrictions: Yes LLE Weight Bearing: Weight bearing as tolerated   Pertinent Vitals/Pain 6/10 L LE.     Mobility  Bed Mobility Overal bed mobility: Needs Assistance Bed Mobility: Supine to Sit;Sit to Supine Supine to sit: Min assist Sit to supine: Min assist General bed mobility comments: assist for L LE onto/off bed.  Transfers Overall transfer level: Needs assistance Transfers: Sit to/from Stand Sit to Stand: Min guard General transfer comment: close guard for safety Ambulation/Gait Ambulation/Gait assistance: Min guard Ambulation Distance (Feet): 100 Feet Assistive device: Rolling walker (2 wheeled) Gait Pattern/deviations: Step-to pattern;Step-through pattern;Antalgic General Gait Details: slow gait speed. HR 160 bpm while ambulating.  Stairs: Yes Stairs  assistance: Min guard Stair Management: One rail Right;Step to pattern;Forwards;With crutches Number of Stairs: 9 General stair comments: VCs safety, technique, sequence. 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 Hx of multiple knee surgeries.     Subjective Data      Cognition  Cognition Arousal/Alertness: Awake/alert Behavior During Therapy: WFL for tasks assessed/performed Overall Cognitive Status: Within Functional Limits for tasks assessed    Balance     End of Session PT - End of Session Equipment Utilized During Treatment: Gait belt Activity Tolerance: Patient limited by fatigue;Patient limited by pain Patient left: in bed;with call bell/phone within reach   GP     Weston Anna, MPT Pager: [phone]

## 2013-10-20 NOTE — Progress Notes (Signed)
Patient's HR increased to 140s-150s while going to the bathroom this morning. After 15 minutes laying quietly in bed, patient's HR continued to stay above 120s, fluctuating from 120s-150s. Patient is asymptomatic at this time. BP stable. 25 mg PO lopressor given. Will continue to monitor patient.

## 2013-10-21 LAB — CBC
HCT: 28.4 % — ABNORMAL LOW (ref 36.0–46.0)
HEMOGLOBIN: 9.9 g/dL — AB (ref 12.0–15.0)
MCH: 29.6 pg (ref 26.0–34.0)
MCHC: 34.9 g/dL (ref 30.0–36.0)
MCV: 84.8 fL (ref 78.0–100.0)
PLATELETS: 259 10*3/uL (ref 150–400)
RBC: 3.35 MIL/uL — ABNORMAL LOW (ref 3.87–5.11)
RDW: 14.3 % (ref 11.5–15.5)
WBC: 8.6 10*3/uL (ref 4.0–10.5)

## 2013-10-21 LAB — BASIC METABOLIC PANEL
BUN: 7 mg/dL (ref 6–23)
CALCIUM: 8.3 mg/dL — AB (ref 8.4–10.5)
CO2: 27 mEq/L (ref 19–32)
Chloride: 98 mEq/L (ref 96–112)
Creatinine, Ser: 0.68 mg/dL (ref 0.50–1.10)
Glucose, Bld: 104 mg/dL — ABNORMAL HIGH (ref 70–99)
Potassium: 3.6 mEq/L — ABNORMAL LOW (ref 3.7–5.3)
SODIUM: 136 meq/L — AB (ref 137–147)

## 2013-10-21 LAB — MAGNESIUM: MAGNESIUM: 1.9 mg/dL (ref 1.5–2.5)

## 2013-10-21 LAB — TSH: TSH: 1.72 u[IU]/mL (ref 0.350–4.500)

## 2013-10-21 MED ORDER — DIGOXIN 250 MCG PO TABS
0.2500 mg | ORAL_TABLET | Freq: Every day | ORAL | Status: DC
  Administered 2013-10-21 – 2013-10-24 (×4): 0.25 mg via ORAL
  Filled 2013-10-21 (×4): qty 1

## 2013-10-21 MED ORDER — METOPROLOL SUCCINATE ER 50 MG PO TB24
150.0000 mg | ORAL_TABLET | Freq: Every morning | ORAL | Status: DC
  Administered 2013-10-21 – 2013-10-23 (×3): 150 mg via ORAL
  Filled 2013-10-21 (×4): qty 1

## 2013-10-21 MED ORDER — VITAMINS A & D EX OINT
TOPICAL_OINTMENT | CUTANEOUS | Status: AC
  Administered 2013-10-21: 5
  Filled 2013-10-21: qty 5

## 2013-10-21 NOTE — Progress Notes (Signed)
PT Cancellation Note  Patient Details Name: Destiny Dennis MRN: [id number] DOB: [birth year]   Cancelled Treatment:    Reason Eval/Treat Not Completed: Medical issues which prohibited therapy (in afib, just got meds)   Claretha Cooper 10/21/2013, 5:14 PM

## 2013-10-21 NOTE — Progress Notes (Signed)
Subjective: Deneis SOB  No CP  Can't feel palpitations   Objective: Filed Vitals:   10/20/13 1445 10/20/13 2128 10/21/13 0505 10/21/13 0825  BP: 119/54 111/55 141/66   Pulse: 95 116 84   Temp: 98.3 F (36.8 C) 99.1 F (37.3 C) 100.4 F (38 C) 98.2 F (36.8 C)  TempSrc: Oral Oral Oral Oral  Resp: 18 20 18    Height:      Weight:      SpO2: 100% 96% 95%    Weight change:   Intake/Output Summary (Last 24 hours) at 10/21/13 0908 Last data filed at 10/21/13 0700  Gross per 24 hour  Intake   1920 ml  Output      0 ml  Net   1920 ml   I/O since admit  Incomplete  General: Alert, awake, oriented x3, in no acute distress Neck:  JVP is normal Heart: Irregular rate and rhythm, without murmurs, rubs, gallops.  Lungs: Clear to auscultation.  No rales or wheezes. Exemities:  No edema.   Neuro: Grossly intact, nonfocal.  Tele:  Afib  130s to 150s  (12 hours)  Lab Results: No results found for this or any previous visit (from the past 24 hour(s)).  Studies/Results: @RISRSLT24 @  Medications: Reviewed   @PROBHOSP @  1.  Atrial fibrillation  Rates are not controlled   On Dilt 240, Toprol XL 100, Dig 0.125   Xarelto was stopped on Sunday 3/1 and resumed on Thursday 3/5.  This would require TEE to perform cardioversion.   WIll continue to get rate control  Increase Dig to 0.25 and Toprol to 150  Follow BP  May need to schedule for Monday before starting Tikosyn (if rates not controlled)  Check EKG   BMET and MG IV fluids to saline lock.    LOS: 4 days   Dorris Carnes 10/21/2013, 9:08 AM

## 2013-10-21 NOTE — Progress Notes (Signed)
Subjective: 4 Days Post-Op Procedure(s) (LRB): LEFT TOTAL HIP ARTHROPLASTY ANTERIOR APPROACH (Left) Patient has been experiencing HR 130s+ b/c of her Afib and she is now being followed by cardiology.  Pt states she feels fine except for soreness in the left anterior thigh.  She denies N/V/F/C, chest pain, SOB, calf pain, or paresthesia b/l.  Objective: Vital signs in last 24 hours: Temp:  [98.2 F (36.8 C)-100.4 F (38 C)] 98.2 F (36.8 C) (03/07 0825) Pulse Rate:  [84-116] 84 (03/07 0505) Resp:  [18-20] 18 (03/07 0505) BP: (111-141)/(54-66) 141/66 mmHg (03/07 0505) SpO2:  [95 %-100 %] 95 % (03/07 0505)  Intake/Output from previous day: 03/06 0701 - 03/07 0700 In: 1920 [P.O.:120; I.V.:1800] Out: -  Intake/Output this shift:    No results found for this basename: HGB,  in the last 72 hours No results found for this basename: WBC, RBC, HCT, PLT,  in the last 72 hours No results found for this basename: NA, K, CL, CO2, BUN, CREATININE, GLUCOSE, CALCIUM,  in the last 72 hours No results found for this basename: LABPT, INR,  in the last 72 hours  WD WN 53y/o female in currently in Afib but in no NAD, A/Ox3, appears stated age.  EOMI, mood and affect normal, respirations unlabored.  On physical exam incision is c/d/i, negative Homan's sign, compartments soft.  Lower extremities are NVI with good motor control of plantar/dorsiflexion of the great toes and feet b/l.  Dressing changed.  Assessment/Plan: 4 Days Post-Op Procedure(s) (LRB): LEFT TOTAL HIP ARTHROPLASTY ANTERIOR APPROACH (Left) Up with therapy Continue medical management by cardiology  Atlas Crossland S 10/21/2013, 9:48 AM

## 2013-10-22 DIAGNOSIS — M7989 Other specified soft tissue disorders: Secondary | ICD-10-CM

## 2013-10-22 DIAGNOSIS — I4891 Unspecified atrial fibrillation: Secondary | ICD-10-CM

## 2013-10-22 LAB — BASIC METABOLIC PANEL
BUN: 9 mg/dL (ref 6–23)
CALCIUM: 8.6 mg/dL (ref 8.4–10.5)
CO2: 26 mEq/L (ref 19–32)
Chloride: 100 mEq/L (ref 96–112)
Creatinine, Ser: 0.75 mg/dL (ref 0.50–1.10)
GFR calc non Af Amer: >90 mL/min (ref >90)
GLUCOSE: 119 mg/dL — AB (ref 70–99)
Potassium: 3.5 mEq/L — ABNORMAL LOW (ref 3.7–5.3)
SODIUM: 130 meq/L — AB (ref 137–147)

## 2013-10-22 MED ORDER — ENOXAPARIN SODIUM 120 MG/0.8ML ~~LOC~~ SOLN
1.0000 mg/kg | Freq: Two times a day (BID) | SUBCUTANEOUS | Status: DC
  Filled 2013-10-22 (×2): qty 0.8

## 2013-10-22 MED ORDER — MAGNESIUM SULFATE 40 MG/ML IJ SOLN
2.0000 g | Freq: Once | INTRAMUSCULAR | Status: AC
  Administered 2013-10-22: 2 g via INTRAVENOUS
  Filled 2013-10-22: qty 50

## 2013-10-22 MED ORDER — ENOXAPARIN SODIUM 120 MG/0.8ML ~~LOC~~ SOLN
1.0000 mg/kg | Freq: Two times a day (BID) | SUBCUTANEOUS | Status: DC
  Filled 2013-10-22: qty 0.8

## 2013-10-22 MED ORDER — POTASSIUM CHLORIDE CRYS ER 20 MEQ PO TBCR
40.0000 meq | EXTENDED_RELEASE_TABLET | Freq: Once | ORAL | Status: AC
  Administered 2013-10-22: 40 meq via ORAL
  Filled 2013-10-22: qty 2

## 2013-10-22 MED ORDER — HEPARIN (PORCINE) IN NACL 100-0.45 UNIT/ML-% IJ SOLN
1700.0000 [IU]/h | INTRAMUSCULAR | Status: DC
  Administered 2013-10-22: 1700 [IU]/h via INTRAVENOUS
  Filled 2013-10-22 (×2): qty 250

## 2013-10-22 NOTE — Progress Notes (Signed)
PT Cancellation Note  Patient Details Name: NIOMI VALENT MRN: [id number] DOB: Apr 30, 1961   Cancelled Treatment:    Reason Eval/Treat Not Completed: Medical issues which prohibited therapy (+ L leg DVT dx today.)   Claretha Cooper 10/22/2013, 4:30 PM

## 2013-10-22 NOTE — Progress Notes (Signed)
Subjective: No SOB  No CP   Objective: Filed Vitals:   10/21/13 1037 10/21/13 1354 10/21/13 2347 10/22/13 0629  BP:  123/71 118/65 122/54  Pulse: 150 115 89 93  Temp:  98.5 F (36.9 C) 98.9 F (37.2 C) 98.7 F (37.1 C)  TempSrc:  Oral Oral Oral  Resp:  18 16 18   Height:      Weight:      SpO2:  99% 97% 93%   Weight change:   Intake/Output Summary (Last 24 hours) at 10/22/13 0912 Last data filed at 10/22/13 0300  Gross per 24 hour  Intake    300 ml  Output      0 ml  Net    300 ml    General: Alert, awake, oriented x3, in no acute distress Neck:  JVP is normal Heart: Regular rate and rhythm, without murmurs, rubs, gallops.  Lungs: Clear to auscultation.  No rales or wheezes. Exemities:  No edema.   Neuro: Grossly intact, nonfocal.  Tele:  Atrial fib  100s avg  Occasoinal up to 130  Lab Results: Results for orders placed during the hospital encounter of 10/17/13 (from the past 24 hour(s))  BASIC METABOLIC PANEL     Status: Abnormal   Collection Time    10/21/13 10:24 AM      Result Value Ref Range   Sodium 136 (*) 137 - 147 mEq/L   Potassium 3.6 (*) 3.7 - 5.3 mEq/L   Chloride 98  96 - 112 mEq/L   CO2 27  19 - 32 mEq/L   Glucose, Bld 104 (*) 70 - 99 mg/dL   BUN 7  6 - 23 mg/dL   Creatinine, Ser 0.68  0.50 - 1.10 mg/dL   Calcium 8.3 (*) 8.4 - 10.5 mg/dL   GFR calc non Af Amer >90  >90 mL/min   GFR calc Af Amer >90  >90 mL/min  TSH     Status: None   Collection Time    10/21/13 10:24 AM      Result Value Ref Range   TSH 1.720  0.350 - 4.500 uIU/mL  MAGNESIUM     Status: None   Collection Time    10/21/13 10:24 AM      Result Value Ref Range   Magnesium 1.9  1.5 - 2.5 mg/dL  CBC     Status: Abnormal   Collection Time    10/21/13 10:24 AM      Result Value Ref Range   WBC 8.6  4.0 - 10.5 K/uL   RBC 3.35 (*) 3.87 - 5.11 MIL/uL   Hemoglobin 9.9 (*) 12.0 - 15.0 g/dL   HCT 28.4 (*) 36.0 - 46.0 %   MCV 84.8  78.0 - 100.0 fL   MCH 29.6  26.0 - 34.0 pg   MCHC  34.9  30.0 - 36.0 g/dL   RDW 14.3  11.5 - 15.5 %   Platelets 259  150 - 400 K/uL    Studies/Results: @RISRSLT24 @  Medications: Reviewed   @PROBHOSP @  1.  Atrial fibrillation.  Rates are better but still too high  I discussed with patient  She wants to go home but she prob wont tolerate and increase in meds  Discussed with Beckie Salts  I would recomm TEE in AM  If neg for clot then begin Tikosyn.    Patient is agreeable  Will accept on our service.  In AM Tx to [facility] after TEE    LOS: 5 days  Dorris Carnes 10/22/2013, 9:12 AM

## 2013-10-22 NOTE — Progress Notes (Signed)
VASCULAR LAB PRELIMINARY  PRELIMINARY  PRELIMINARY  PRELIMINARY  Left lower extremity venous Doppler completed.    Preliminary report:  There is acute, occlusive DVT throughout the left peroneal vein.  Can not rule out non occlusive new thrombosis in the left common femoral vein.  Blood flow is sluggish throughout the left thigh.  Thekla Colborn, RVT 10/22/2013, 4:03 PM

## 2013-10-22 NOTE — Progress Notes (Signed)
Patient sustained no episodes of sustained increased HR. HR continued to go back and forth from 90s to low 120s. Consistently would stay in the 100s. Only when pt would go to the bathroom would the HR increase to 140s. Once back in bed, HR would come back to baseline. Will continue to monitor patient heart rate closely. Blanchard Kelch, RN

## 2013-10-22 NOTE — Progress Notes (Addendum)
ANTICOAGULATION CONSULT NOTE - Initial Consult  Pharmacy Consult for IV heparin Indication: atrial fibrillation and DVT  Allergies  Allergen Reactions  . Avelox [Moxifloxacin Hcl In Nacl] Other (See Comments)    Numbness & tingling Peripheral neuropathy    Patient Measurements: Height: 5\' 11"  (180.3 cm) Weight: 260 lb (117.935 kg) IBW/kg (Calculated) : 70.8  Vital Signs: Temp: 98.6 F (37 C) (03/08 1417) Temp src: Oral (03/08 1417) BP: 118/62 mmHg (03/08 1417) Pulse Rate: 87 (03/08 1417)  Labs:  Recent Labs  10/21/13 1024 10/22/13 1055  HGB 9.9*  --   HCT 28.4*  --   PLT 259  --   CREATININE 0.68 0.75    Estimated Creatinine Clearance: 119 ml/min (by C-G formula based on Cr of 0.75).   Medical History: Past Medical History  Diagnosis Date  . Atrial fibrillation 03/05/2007    a. s/p RFCA 12/18/2011, 07/29/12  . 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 Sialoadenitis 03/18/2010  . PVC (premature ventricular contraction)   . History of MRSA infection 2008    superficial skin-cleared easily with doxycycline  . Allergic rhinitis   . Cervical disc disease   . Benign fundic gland polyps of stomach   . Diverticulitis     CT Scan   . Hemorrhoids   . Atrial flutter     a. s/p RFCA 12/18/2011  . Complication of anesthesia   . PONV (postoperative nausea and vomiting)   . Arthritis   . Family history of anesthesia complication     " My Uncle " unsure of type of problem  . Anxiety     when tachycardia occurs  . GERD 12/23/2009    diet related-not a problem  . Hiatal hernia     small  . Headache(784.0)     migraines occ.  Marland Kitchen Dysrhythmia     hx. Atrial fib/ flutter    Medications:  Scheduled:  . cholecalciferol  4,000 Units Oral Daily  . diazepam  5 mg Oral QHS  . digoxin  0.25 mg Oral Daily  . diltiazem  240 mg Oral Daily  . docusate sodium  100 mg Oral BID  . metoprolol succinate  150 mg Oral q morning - 10a  . multivitamin with minerals  1 tablet Oral  Daily  . omega-3 acid ethyl esters  2 g Oral Daily  . oxyCODONE  5-15 mg Oral Q4H  . polyethylene glycol  17 g Oral BID   Infusions:    Assessment: 53 yo female on Xarelto 20mg  once daily PTA for hx Afib. Patient s/p THA on 3/3 and last dose of Xarelto was given prior to surgery on 3/1 then lower dose of 10mg  once daily was started 3/4 before dose increased back to 20mg  once daily the next day on 3/5. Patient now with new acute occlusive DVT throughout the left peroneal vein. At this point, unsure if this can attributed to Xarelto failure or more due to surgery and off of Xarelto for a few days. After discussion between pharmacy, ortho and cardiology, it was decided to go ahead and start IV heparin due to patient being symptomatic and due to large clot burden. Will wait on start of warfarin until after cardiology evaluation in AM 3/9 and potential TEE  Goal of Therapy:  Heparin level 0.3-0.7 Monitor platelets by anticoagulation protocol: Yes   Plan:  1) No heparin bolus 2) IV heparin 1700 units/hr 3) Check heparin level 6 hours after starting heparin 4) Daily heparin level and  [place], PharmD, BCPS Pager [phone] 10/22/2013 6:35 PM

## 2013-10-22 NOTE — Progress Notes (Signed)
   Subjective: 5 Days Post-Op Procedure(s) (LRB): LEFT TOTAL HIP ARTHROPLASTY ANTERIOR APPROACH (Left)  Mild soreness in left hip Currently being treated for uncontrolled A-fib which is delaying discharge Patient reports pain as mild.  Objective:   VITALS:   Filed Vitals:   10/22/13 0629  BP: 122/54  Pulse: 93  Temp: 98.7 F (37.1 C)  Resp: 18    Left hip incision healing well nv intact distally No rashes or edema Good early rom  LABS  Recent Labs  10/21/13 1024  HGB 9.9*  HCT 28.4*  WBC 8.6  PLT 259     Recent Labs  10/21/13 1024  NA 136*  K 3.6*  BUN 7  CREATININE 0.68  GLUCOSE 104*     Assessment/Plan: 5 Days Post-Op Procedure(s) (LRB): LEFT TOTAL HIP ARTHROPLASTY ANTERIOR APPROACH (Left)  Continue PT/OT as able Will await work from cardiology about continued treatment and possible TEE Will not discharge until cleared from cardiology standpoint Patient in agreement   Merla Riches, [place], PA-C  10/22/2013, 8:38 AM

## 2013-10-22 NOTE — Progress Notes (Signed)
Orthopedics Progress Note  Subjective: Patient noted increased thigh and leg swelling today acutely.  MD notified and doppler study ordered. Patient denies SOB or chest pain.   Objective:  Filed Vitals:   10/22/13 1417  BP: 118/62  Pulse: 87  Temp: 98.6 F (37 C)  Resp: 16    General: Awake and alert. Patient appears very comfortable with no signs of tachypnea, dyspnea or agitation Musculoskeletal: left thigh swollen, no cords and no pain with Homan's maneuver.  Neurovascularly intact  Lab Results  Component Value Date   WBC 8.6 10/21/2013   HGB 9.9* 10/21/2013   HCT 28.4* 10/21/2013   MCV 84.8 10/21/2013   PLT 259 10/21/2013       Component Value Date/Time   NA 130* 10/22/2013 1055   K 3.5* 10/22/2013 1055   CL 100 10/22/2013 1055   CO2 26 10/22/2013 1055   GLUCOSE 119* 10/22/2013 1055   BUN 9 10/22/2013 1055   CREATININE 0.75 10/22/2013 1055   CALCIUM 8.6 10/22/2013 1055   GFRNONAA >90 10/22/2013 1055   GFRAA >90 10/22/2013 1055    Lab Results  Component Value Date   INR 1.06 10/16/2013   INR 0.98 12/12/2012   INR 1.41 12/07/2012    Assessment/Plan: POD #5 s/p Procedure(s): LEFT TOTAL HIP ARTHROPLASTY ANTERIOR APPROACH Patient with left LE DVT by doppler.  I have spoken with the patient, her surgeon (Dr Alvan Dame), and the cardiologist on-call this evening and also clinical pharmacy here at [facility].  The consensus plan is to add a Heparin drip to address this new clot even though the patient had 20 mg of Xarelto today already due to the significant swelling in the leg and clot burden and poor blood flow in the leg.  All are in agreement with this plan, understanding the increased risk for bleeding.  Will increase the frequency of vital signs to q2 hours.  Doran Heater. Veverly Fells, MD 10/22/2013 6:32 PM

## 2013-10-23 LAB — CBC
HEMATOCRIT: 30.5 % — AB (ref 36.0–46.0)
HEMOGLOBIN: 10.1 g/dL — AB (ref 12.0–15.0)
MCH: 28.3 pg (ref 26.0–34.0)
MCHC: 33.1 g/dL (ref 30.0–36.0)
MCV: 85.4 fL (ref 78.0–100.0)
Platelets: 313 10*3/uL (ref 150–400)
RBC: 3.57 MIL/uL — ABNORMAL LOW (ref 3.87–5.11)
RDW: 14.1 % (ref 11.5–15.5)
WBC: 8.1 10*3/uL (ref 4.0–10.5)

## 2013-10-23 LAB — HEPARIN LEVEL (UNFRACTIONATED)
Heparin Unfractionated: 1.7 IU/mL — ABNORMAL HIGH (ref 0.30–0.70)
Heparin Unfractionated: 1.04 IU/mL — ABNORMAL HIGH (ref 0.30–0.70)

## 2013-10-23 LAB — MAGNESIUM: Magnesium: 2.2 mg/dL (ref 1.5–2.5)

## 2013-10-23 LAB — APTT
aPTT: 59 seconds — ABNORMAL HIGH (ref 24–37)
aPTT: 46 seconds — ABNORMAL HIGH (ref 24–37)

## 2013-10-23 MED ORDER — HEPARIN (PORCINE) IN NACL 100-0.45 UNIT/ML-% IJ SOLN
1850.0000 [IU]/h | INTRAMUSCULAR | Status: DC
  Filled 2013-10-23 (×2): qty 250

## 2013-10-23 MED ORDER — RIVAROXABAN 15 MG PO TABS
15.0000 mg | ORAL_TABLET | Freq: Two times a day (BID) | ORAL | Status: DC
  Administered 2013-10-23 – 2013-10-24 (×3): 15 mg via ORAL
  Filled 2013-10-23 (×5): qty 1

## 2013-10-23 NOTE — Progress Notes (Addendum)
Subjective:  No chest pain; not aware of palpitations  Objective:   Vital Signs in the last 24 hours: Temp:  [97.7 F (36.5 C)-99.4 F (37.4 C)] 97.8 F (36.6 C) (03/09 0510) Pulse Rate:  [87-107] 105 (03/09 0510) Resp:  [16-20] 19 (03/09 0510) BP: (118-124)/(62-67) 119/62 mmHg (03/09 0510) SpO2:  [97 %-100 %] 97 % (03/09 0510)  Intake/Output from previous day: 03/08 0701 - 03/09 0700 In: 198.9 [I.V.:198.9] Out: -   Medications: . cholecalciferol  4,000 Units Oral Daily  . diazepam  5 mg Oral QHS  . digoxin  0.25 mg Oral Daily  . diltiazem  240 mg Oral Daily  . docusate sodium  100 mg Oral BID  . metoprolol succinate  150 mg Oral q morning - 10a  . multivitamin with minerals  1 tablet Oral Daily  . omega-3 acid ethyl esters  2 g Oral Daily  . oxyCODONE  5-15 mg Oral Q4H  . polyethylene glycol  17 g Oral BID    . heparin 1,850 Units/hr (10/23/13 0357)    Physical Exam:   General appearance: alert, cooperative and no distress Neck: no carotid bruit, no JVD, supple, symmetrical, trachea midline and thyroid not enlarged, symmetric, no tenderness/mass/nodules Lungs: clear to auscultation bilaterally Heart: irregularly irregular rhythm and rate 110-120 Abdomen: soft, non-tender; bowel sounds normal; no masses,  no organomegaly Extremities: mild swelling LE L>R Neurologic: Grossly normal   Rate: 120's   Rhythm: atrial fibrillation  Lab Results:    Recent Labs  10/21/13 1024 10/22/13 1055  NA 136* 130*  K 3.6* 3.5*  CL 98 100  CO2 27 26  GLUCOSE 104* 119*  BUN 7 9  CREATININE 0.68 0.75   No results found for this basename: TROPONINI, CK, MB,  in the last 72 hours Hepatic Function Panel No results found for this basename: PROT, ALBUMIN, AST, ALT, ALKPHOS, BILITOT, BILIDIR, IBILI,  in the last 72 hours No results found for this basename: INR,  in the last 72 hours BNP (last 3 results) No results found for this basename: PROBNP,  in the last 8760  hours  Lipid Panel     Component Value Date/Time   CHOL 205* 11/15/2006 1437   TRIG 59 11/15/2006 1437   HDL 54.0 11/15/2006 1437   CHOLHDL 3.8 CALC 11/15/2006 1437   VLDL 12 11/15/2006 1437   CBC    Component Value Date/Time   WBC 8.1 10/23/2013 0228   RBC 3.57* 10/23/2013 0228   HGB 10.1* 10/23/2013 0228   HCT 30.5* 10/23/2013 0228   PLT 313 10/23/2013 0228   MCV 85.4 10/23/2013 0228   MCH 28.3 10/23/2013 0228   MCHC 33.1 10/23/2013 0228   RDW 14.1 10/23/2013 0228   LYMPHSABS 1.9 09/15/2013 1602   MONOABS 0.5 09/15/2013 1602   EOSABS 0.1 09/15/2013 1602   BASOSABS 0.0 09/15/2013 1602      Imaging:  No results found.    Assessment/Plan:   Principal Problem:   S/P left THA, AA Active Problems:   Atrial fibrillation   Expected blood loss anemia   Obese   Plan as outlined by Dr. Harrington Challenger. Pt is s/p 2 previous ablations by Dr. Rayann Heman. Rate control meds were increased yesterday. Rate stilll increased to 110 - 120 but better than yesterday. For possible TEE today, have notified Trish regarding possible schedule and she will also notify Dr. Rayann Heman who is her EP MD on his thoughts regarding plan.Will keep NPO until decision for TEE possibility today  and Tikosyn. Pt is on heparin for anticoagulation.Troy Sine, MD, [facility] 10/23/2013, 8:34 AM  Addendum: Will resume xarelto for DVT dosing and DC heparin. No TEE today per Dr. Rayann Heman.  Troy Sine, MD 10/23/2013 10:13 AM

## 2013-10-23 NOTE — Progress Notes (Signed)
ANTICOAGULATION CONSULT NOTE - Follow Up Consult  Pharmacy Consult for Heparin Indication: atrial fibrillation and DVT   Allergies  Allergen Reactions  . Avelox [Moxifloxacin Hcl In Nacl] Other (See Comments)    Numbness & tingling Peripheral neuropathy    Patient Measurements: Height: 5\' 11"  (180.3 cm) Weight: 260 lb (117.935 kg) IBW/kg (Calculated) : 70.8 Heparin Dosing Weight:   Vital Signs: Temp: 99.4 F (37.4 C) (03/09 0305) Temp src: Oral (03/09 0305) BP: 121/67 mmHg (03/09 0305) Pulse Rate: 107 (03/09 0305)  Labs:  Recent Labs  10/21/13 1024 10/22/13 1055 10/23/13 0228  HGB 9.9*  --  10.1*  HCT 28.4*  --  30.5*  PLT 259  --  313  APTT  --   --  59*  HEPARINUNFRC  --   --  1.70*  CREATININE 0.68 0.75  --     Estimated Creatinine Clearance: 119 ml/min (by C-G formula based on Cr of 0.75).   Medications:  Infusions:  . heparin 1,850 Units/hr (10/23/13 0357)    Assessment: Patient with high heparin level but PTT below goal (66-102sec).  Due to prior xarelto dosing, will use PTT results until heparin level and PTT correlate.  No issues per RN.  Goal of Therapy:  Heparin level 0.3-0.7 units/ml aPTT 66-102 seconds Monitor platelets by anticoagulation protocol: Yes   Plan:  Increase heparin to 1850 units/hr Recheck level at [place], [place] 10/23/2013,4:10 AM

## 2013-10-23 NOTE — Progress Notes (Signed)
Patient ID: Destiny Dennis, female   DOB: 1960/11/23, 53 y.o.   MRN: [id number] Subjective: 6 Days Post-Op Procedure(s) (LRB): LEFT TOTAL HIP ARTHROPLASTY ANTERIOR APPROACH (Left)    Patient reports pain as mild.  From hip perspective she has done well.  Unfortunately she was diagnosed this weekend with DVT involving her left lower extremity.  She has had no history of DVT. Was on Xarelto pre-operatively for her a-fib.  Spoke with Dr. Beryle Beams about issues at had and will try to help arrange for follow up with him in April  Objective:   VITALS:   Filed Vitals:   10/23/13 0510  BP: 119/62  Pulse: 105  Temp: 97.8 F (36.6 C)  Resp: 19    Neurovascular intact Incision: dressing C/D/I Compartment soft diffuse left lower extremity swelling particularly in thigh region  LABS  Recent Labs  10/21/13 1024 10/23/13 0228  HGB 9.9* 10.1*  HCT 28.4* 30.5*  WBC 8.6 8.1  PLT 259 313     Recent Labs  10/21/13 1024 10/22/13 1055  NA 136* 130*  K 3.6* 3.5*  BUN 7 9  CREATININE 0.68 0.75  GLUCOSE 104* 119*    No results found for this basename: LABPT, INR,  in the last 72 hours   Assessment/Plan: 6 Days Post-Op Procedure(s) (LRB): LEFT TOTAL HIP ARTHROPLASTY ANTERIOR APPROACH (Left)   Up with therapy  D/C plan per cardiology Complicated course related to persistent rate uncontrolled a-fib, now superimposed issues of DVT On heparin gtt currrently According to Dr. Beryle Beams he felt that options could be continued specific Factor 10 inhibition with Xarelto or if we truly felt this represented a Xarelto failure to switch to Pradaxa or Coumadin.  Will follow If cards feels that she needs further specific a-fib management we could provide interim discharge as it pertains to the hip

## 2013-10-23 NOTE — Progress Notes (Signed)
Physical Therapy Treatment Patient Details Name: Destiny Dennis MRN: [id number] DOB: 11-Jun-1961 Today's Date: 10/23/2013 Time: 4174-0814 PT Time Calculation (min): 27 min  PT Assessment / Plan / Recommendation  History of Present Illness 53 yo female s/p L THA-DA 10/17/13 with post op afib with RVR and + L LE DVT. Hx of multiple knee surgeries.    PT Comments   Pt feeling better however frustrated by L LE DVT.  Per RN pt off heparin and starting back on Xarelto.  Pt ambulated good distance in hallway and performed standing exercises to tolerance and rest breaks as needed.   Follow Up Recommendations  Home health PT     Does the patient have the potential to tolerate intense rehabilitation     Barriers to Discharge        Equipment Recommendations  None recommended by PT    Recommendations for Other Services    Frequency 7X/week   Progress towards PT Goals Progress towards PT goals: Progressing toward goals  Plan Current plan remains appropriate    Precautions / Restrictions Precautions Precautions: None Precaution Comments: A fib; monitor HR Restrictions LLE Weight Bearing: Weight bearing as tolerated   Pertinent Vitals/Pain Pt reports premedicated, ice pack applied    Mobility  Bed Mobility Overal bed mobility: Needs Assistance Bed Mobility: Supine to Sit;Sit to Supine Supine to sit: Min assist Sit to supine: Supervision General bed mobility comments: Assist for L LE over EOB, pt able to self assist L LE onto bed using UEs Transfers Overall transfer level: Needs assistance Equipment used: Rolling walker (2 wheeled) Transfers: Sit to/from Stand Sit to Stand: Supervision General transfer comment: close guard for safety Ambulation/Gait Ambulation/Gait assistance: Supervision Ambulation Distance (Feet): 200 Feet Assistive device: Rolling walker (2 wheeled) Gait Pattern/deviations: Step-through pattern;Antalgic;Decreased dorsiflexion - left General Gait Details: slow  gait speed. verbal cues for step length and heel strike; no c/o dizziness or nausea and pt able to progress distance, HR did elevate per RN however she reports that is expected General stair comments: pt did not feel she needed to practice stairs again    Exercises Total Joint Exercises Hip ABduction/ADduction: AROM;Standing;Left;10 reps Marching in Standing: AROM;Standing;Left;10 reps Standing Hip Extension: AROM;Standing;Left;10 reps General Exercises - Lower Extremity Heel Raises: AROM;Standing;Both;10 reps Other Exercises Other Exercises: ham curls x10 L LE Other Exercises: all standing exercises performed with bil UE support and to pt tolerance/comfort   PT Diagnosis:    PT Problem List:   PT Treatment Interventions:     PT Goals (current goals can now be found in the care plan section)    Visit Information  Last PT Received On: 10/23/13 Assistance Needed: +1 History of Present Illness: 53 yo female s/p L THA-DA 10/17/13 with post op afib with RVR and + L LE DVT. Hx of multiple knee surgeries.     Subjective Data      Cognition  Cognition Arousal/Alertness: Awake/alert Behavior During Therapy: WFL for tasks assessed/performed Overall Cognitive Status: Within Functional Limits for tasks assessed    Balance     End of Session PT - End of Session Activity Tolerance: Patient tolerated treatment well Patient left: in bed;with call bell/phone within reach   GP     Carmelina Balducci,[name] 10/23/2013, 4:01 PM Carmelia Bake, PT, DPT 10/23/2013 Pager: [phone]

## 2013-10-23 NOTE — Discharge Instructions (Signed)
Information on my medicine - XARELTO (rivaroxaban)  This medication education was reviewed with me or my healthcare representative as part of my discharge preparation.  The pharmacist that spoke with me during my hospital stay was:  [place], [place], [place]? Xarelto was prescribed to treat blood clots that may have been found in the veins of your legs (deep vein thrombosis) or in your lungs (pulmonary embolism) and to reduce the risk of them occurring again.  What do you need to know about Xarelto? The starting dose is one 15 mg tablet taken TWICE daily with food for the FIRST 21 DAYS then on 11/13/2013  the dose is changed to one 20 mg tablet taken ONCE A DAY with your morning or evening meal.  DO NOT stop taking Xarelto without talking to the health care provider who prescribed the medication.  Refill your prescription for 20 mg tablets before you run out.  After discharge, you should have regular check-up appointments with your healthcare provider that is prescribing your Xarelto.  In the future your dose may need to be changed if your kidney function changes by a significant amount.  What do you do if you miss a dose? If you are taking Xarelto TWICE DAILY and you miss a dose, take it as soon as you remember. You may take two 15 mg tablets (total 30 mg) at the same time then resume your regularly scheduled 15 mg twice daily the next day.  If you are taking Xarelto ONCE DAILY and you miss a dose, take it as soon as you remember on the same day then continue your regularly scheduled once daily regimen the next day. Do not take two doses of Xarelto at the same time.   Important Safety Information Xarelto is a blood thinner medicine that can cause bleeding. You should call your healthcare provider right away if you experience any of the following:   Bleeding from an injury or your nose that does not stop.   Unusual colored urine (red or dark brown) or  unusual colored stools (red or black).   Unusual bruising for unknown reasons.   A serious fall or if you hit your head (even if there is no bleeding).  Some medicines may interact with Xarelto and might increase your risk of bleeding while on Xarelto. To help avoid this, consult your healthcare provider or pharmacist prior to using any new prescription or non-prescription medications, including herbals, vitamins, non-steroidal anti-inflammatory drugs (NSAIDs) and supplements.  This website has more information on Xarelto: [email or web address]

## 2013-10-24 ENCOUNTER — Encounter (HOSPITAL_COMMUNITY): Payer: Self-pay | Admitting: *Deleted

## 2013-10-24 DIAGNOSIS — I82409 Acute embolism and thrombosis of unspecified deep veins of unspecified lower extremity: Secondary | ICD-10-CM

## 2013-10-24 DIAGNOSIS — E669 Obesity, unspecified: Secondary | ICD-10-CM

## 2013-10-24 DIAGNOSIS — Z86718 Personal history of other venous thrombosis and embolism: Secondary | ICD-10-CM

## 2013-10-24 MED ORDER — METOPROLOL SUCCINATE ER 100 MG PO TB24
100.0000 mg | ORAL_TABLET | Freq: Two times a day (BID) | ORAL | Status: DC
  Administered 2013-10-24: 100 mg via ORAL
  Filled 2013-10-24 (×2): qty 1

## 2013-10-24 MED ORDER — RIVAROXABAN 20 MG PO TABS: 20.0000 mg | ORAL_TABLET | Freq: Every morning | ORAL | Status: DC

## 2013-10-24 MED ORDER — DILTIAZEM HCL ER COATED BEADS 360 MG PO CP24: 360.0000 mg | ORAL_CAPSULE | Freq: Every day | ORAL | Status: DC

## 2013-10-24 MED ORDER — METOPROLOL SUCCINATE ER 100 MG PO TB24: 100.0000 mg | ORAL_TABLET | Freq: Two times a day (BID) | ORAL | Status: DC

## 2013-10-24 MED ORDER — RIVAROXABAN 15 MG PO TABS: 15.0000 mg | ORAL_TABLET | Freq: Two times a day (BID) | ORAL | Status: DC

## 2013-10-24 NOTE — Progress Notes (Signed)
Physical Therapy Treatment Note   10/24/13 1600  PT Visit Information  Last PT Received On 10/24/13  Assistance Needed +1  History of Present Illness 53 yo female s/p L THA-DA 10/17/13 with post op afib with RVR and + L LE DVT. Hx of multiple knee surgeries.   PT Time Calculation  PT Start Time 1552  PT Stop Time 1616  PT Time Calculation (min) 24 min  Subjective Data  Subjective Pt reports d/c home today and now off telemetry.  Pt reports feeling better already and wished to ambulate and practice stairs prior to d/c home later today.  Pt had no further questions/concerns.  Precautions  Precautions None  Restrictions  LLE Weight Bearing WBAT  Cognition  Arousal/Alertness Awake/alert  Behavior During Therapy WFL for tasks assessed/performed  Overall Cognitive Status Within Functional Limits for tasks assessed  Bed Mobility  Overal bed mobility Needs Assistance  Bed Mobility Supine to Sit;Sit to Supine  Supine to sit Min assist  Sit to supine Supervision  General bed mobility comments Assist for L LE over EOB, pt able to self assist L LE onto bed using UEs, discussed using sheet to assist L LE off bed if home alone  Transfers  Overall transfer level Needs assistance  Equipment used Rolling walker (2 wheeled)  Transfers Sit to/from Stand  Sit to Stand Supervision  Ambulation/Gait  Ambulation/Gait assistance Supervision  Ambulation Distance (Feet) 220 Feet  Assistive device Rolling walker (2 wheeled)  Gait Pattern/deviations Step-through pattern;Decreased dorsiflexion - left  General Gait Details slow gait speed however improvement in gait pattern noticeable since previous visits, pt able to perform more knee/hip flexion for more fluid motion  Stairs Yes  Stairs assistance Min guard  Stair Management One rail Left;Forwards;With crutches;Step to pattern  Number of Stairs 9  General stair comments pt performed steps with verbal cues for sequence with rail on L (like at home inside)  and crutch, pt then wished to try a couple steps with just crutch so demonstrated safe technique and pt performed 3 steps with verbal cues min/guard, pt states she will have supervision for stairs so educated her that 2nd person should have one foot up a step and the other on another step (not to have the same feet on the same step) in case they need to assist her balance  PT - End of Session  Equipment Utilized During Treatment Gait belt  Activity Tolerance Patient tolerated treatment well  Patient left in bed;with call bell/phone within reach  PT - Assessment/Plan  PT Plan Current plan remains appropriate  PT Frequency 7X/week  Follow Up Recommendations Home health PT  PT equipment None recommended by PT  PT Goal Progression  Progress towards PT goals Progressing toward goals  PT General Charges  $$ ACUTE PT VISIT 1 Procedure  PT Treatments  $Gait Training 23-37 mins   Destiny Dennis, PT, DPT 10/24/2013 Pager: [phone]

## 2013-10-24 NOTE — Progress Notes (Signed)
   Subjective: 7 Days Post-Op Procedure(s) (LRB): LEFT TOTAL HIP ARTHROPLASTY ANTERIOR APPROACH (Left)   Patient reports pain as mild, pain controlled. She is frustrated with her lack of a concrete plan from cardiology.  Dr. Alvan Dame to discuss with Dr. Rayann Heman to come up with a plan. Orthopaedically stable, have been placed on Xarelto for DVT treatment.  Ready to be discharged home from an orthopaedic standpoint.  Objective:   VITALS:   Filed Vitals:   10/24/13 1400  BP: 105/64  Pulse: 85  Temp: 98 F (36.7 C)  Resp: 16    Neurovascular intact Dorsiflexion/Plantar flexion intact Incision: dressing C/D/I No cellulitis present Compartment soft  LABS  Recent Labs  10/23/13 0228  HGB 10.1*  HCT 30.5*  WBC 8.1  PLT 313     Recent Labs  10/22/13 1055  NA 130*  K 3.5*  BUN 9  CREATININE 0.75  GLUCOSE 119*     Assessment/Plan: 7 Days Post-Op Procedure(s) (LRB): LEFT TOTAL HIP ARTHROPLASTY ANTERIOR APPROACH (Left) Dr. Alvan Dame to discuss with Dr Rayann Heman. Up with therapy Discharge home with home health Follow up in 2 weeks at [facility]. Follow up with OLIN,Destiny Dennis D in 2 weeks.  Contact information:  [facility] [address] [place] Destiny Dennis   PAC  10/24/2013, 3:08 PM

## 2013-10-24 NOTE — Progress Notes (Signed)
   Subjective:  No chest pain  Objective:   Vital Signs in the last 24 hours: Temp:  [97.8 F (36.6 C)-98.5 F (36.9 C)] 98.5 F (36.9 C) (03/10 0521) Pulse Rate:  [92-114] 98 (03/10 0521) Resp:  [18-20] 18 (03/10 0521) BP: (120-127)/(53-70) 127/61 mmHg (03/10 0521) SpO2:  [96 %-98 %] 96 % (03/10 0521)  Intake/Output from previous day: 03/09 0701 - 03/10 0700 In: 1260 [P.O.:1260] Out: -   Medications: . cholecalciferol  4,000 Units Oral Daily  . diazepam  5 mg Oral QHS  . digoxin  0.25 mg Oral Daily  . diltiazem  240 mg Oral Daily  . docusate sodium  100 mg Oral BID  . metoprolol succinate  150 mg Oral q morning - 10a  . multivitamin with minerals  1 tablet Oral Daily  . omega-3 acid ethyl esters  2 g Oral Daily  . oxyCODONE  5-15 mg Oral Q4H  . polyethylene glycol  17 g Oral BID  . Rivaroxaban  15 mg Oral BID WC       Physical Exam:   General appearance: alert, cooperative and no distress Neck: no adenopathy, no carotid bruit, no JVD, supple, symmetrical, trachea midline and thyroid not enlarged, symmetric, no tenderness/mass/nodules Lungs: clear to auscultation bilaterally Heart: irregularly irregular rhythm; rate better now ~100 Abdomen: soft, non-tender; bowel sounds normal; no masses,  no organomegaly Extremities: mild LE swelling LE>r Neurologic: Grossly normal   Rate: 100  Rhythm: atrial fibrillation  Lab Results:     Recent Labs  10/21/13 1024 10/22/13 1055  NA 136* 130*  K 3.6* 3.5*  CL 98 100  CO2 27 26  GLUCOSE 104* 119*  BUN 7 9  CREATININE 0.68 0.75   No results found for this basename: TROPONINI, CK, MB,  in the last 72 hours Hepatic Function Panel No results found for this basename: PROT, ALBUMIN, AST, ALT, ALKPHOS, BILITOT, BILIDIR, IBILI,  in the last 72 hours No results found for this basename: INR,  in the last 72 hours BNP (last 3 results) No results found for this basename: PROBNP,  in the last 8760 hours  Lipid Panel    Component Value Date/Time   CHOL 205* 11/15/2006 1437   TRIG 59 11/15/2006 1437   HDL 54.0 11/15/2006 1437   CHOLHDL 3.8 CALC 11/15/2006 1437   VLDL 12 11/15/2006 1437      Imaging:  No results found.    Assessment/Plan:   Principal Problem:   S/P left THA, AA Active Problems:   Atrial fibrillation   Expected blood loss anemia   Obese   DVT (deep venous thrombosis)   Pt now on Xarelto 15 mg bid for  21 days prior to 20 mg daily DVT. AF rate is slightly better. No need for TEE if not planning for Tikosyn initiation presently. Will attempt improved rate control and change Toprol to 100 mg bid. If rate control consider d/c in several days and if still in AF in several weeks consider Tikosyn then without need for TEE after appropriate timing of anticoagulation.   Troy Sine, MD, [facility] 10/24/2013, 7:46 AM

## 2013-10-24 NOTE — Progress Notes (Signed)
Patient discharged home with family, discharge instructions given and explained to patient, and she verbalized understanding. Surgical incision clean/dry/intact, no sign of infection noted. No other wound noted. Patient accompanied home by family, transported to the car by staff, via wheelchair.

## 2013-10-24 NOTE — Consult Note (Signed)
ELECTROPHYSIOLOGY CONSULT NOTE    Patient ID: Destiny Dennis MRN: [id number], DOB/AGE: [birth year] 51 y.o.  Admit date: 10/17/2013 Date of Consult: 10-24-13  Primary Physician: Donia Ast, FNP Primary Cardiologist: Stanford Breed Electrophysiologist: Brodee Mauritz  Reason for Consultation: atrial fibrillation  HPI:  Destiny Dennis is a 53 y.o. female with a past medical history of atrial fibrillation (s/p PVI 12/2011, 07/2012; previously did not tolerate Multaq) and arthritis.  She was admitted 10-14-13 for planned left total hip arthroplasty.  Prior to admission, she was having paroxysms of atrial fibrillation and Tikosyn initiation was recommended.  Because of planned orthopedic surgery, Tikosyn loading was postphoned.  After surgery, she has remained in atrial fibrillation with ventricular rates 90-130's.  She has been relatively asymptomatic.  She has also developed a left lower leg DVT.  She has been placed on DVT dosing with Xarelto.   Last TEE 07/2012 demonstrated EF 60-65%.   EP has been asked to evaluate for treatment options.  She denies chest pain, shortness of breath, dizziness, or syncope.  ROS is otherwise negative.   Past Medical History  Diagnosis Date  . Atrial fibrillation 03/05/2007    a. s/p RFCA 12/18/2011, 07/29/12  . 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 Sialoadenitis 03/18/2010  . PVC (premature ventricular contraction)   . History of MRSA infection 2008    superficial skin-cleared easily with doxycycline  . Allergic rhinitis   . Cervical disc disease   . Benign fundic gland polyps of stomach   . Diverticulitis     CT Scan   . Hemorrhoids   . Atrial flutter     a. s/p RFCA 12/18/2011  . Arthritis   . Anxiety     when tachycardia occurs  . GERD 12/23/2009    diet related-not a problem  . Hiatal hernia     small  . Headache(784.0)     migraines occ.     Surgical History:  Past Surgical History  Procedure Laterality Date  . Knee surgery     x 9 Left Knee  . Upper gastrointestinal endoscopy  01/30/2010    hiatal hernia, fundic gland polyps  . Shoulder surgery      Right   . Elbow surgery      Right  . Foot surgery      Right   . Cervical fusion    . Cesarean section      x 2  . Wrist surgery      Left   . Cholecystectomy    . Flexible sigmoidoscopy      1990's  . Colonoscopy  07/16/2011    diverticulosis  . Tee without cardioversion  12/17/2011    Procedure: TRANSESOPHAGEAL ECHOCARDIOGRAM (TEE);  Surgeon: Larey Dresser, MD;  Location: [facility] ENDOSCOPY;  Service: Cardiovascular;  Laterality: N/A;  . Atrial fibrillation and atrial flutter ablation  12/17/11, 07/29/12    PVI and CTI ablations by Dr Rayann Heman  . Tee without cardioversion  07/28/2012    Procedure: TRANSESOPHAGEAL ECHOCARDIOGRAM (TEE);  Surgeon: Thayer Headings, MD;  Location: [facility] ENDOSCOPY;  Service: Cardiovascular;  Laterality: N/A;  . Atrial fibrillation ablation  07/29/2012    PVI by Dr Rayann Heman (second procedure)  . Total knee arthroplasty Right 12/12/2012    Procedure: RIGHT TOTAL KNEE ARTHROPLASTY;  Surgeon: Mauri Pole, MD;  Location: [facility] ORS;  Service: Orthopedics;  Laterality: Right;  . I&d knee with poly exchange Left 12/12/2012    Procedure: LEFT KNEE EXCISION SAPHENOUS NEUROMA/OPEN SCAR  DEBRIDEMENT/POLY EXCHANGE/NERVE EXCISION;  Surgeon: Mauri Pole, MD;  Location: [facility] ORS;  Service: Orthopedics;  Laterality: Left;  . Joint replacement Bilateral     4'14  . Total hip arthroplasty Left 10/17/2013    Procedure: LEFT TOTAL HIP ARTHROPLASTY ANTERIOR APPROACH;  Surgeon: Mauri Pole, MD;  Location: [facility] ORS;  Service: Orthopedics;  Laterality: Left;     Prescriptions prior to admission  Medication Sig Dispense Refill  . Barberry-Oreg Grape-Goldenseal (BERBERINE COMPLEX PO) Take 1 tablet by mouth 2 (two) times daily.      . Cholecalciferol (VITAMIN D3) 2000 UNITS capsule Take 4,000 Units by mouth daily.      . diazepam (VALIUM) 5 MG tablet Take 5 mg by mouth  at bedtime.      . fish oil-omega-3 fatty acids 1000 MG capsule Take 2 g by mouth daily.      . metoprolol succinate (TOPROL-XL) 100 MG 24 hr tablet Take 100 mg by mouth every morning. Take with or immediately following a meal.      . metoprolol tartrate (LOPRESSOR) 25 MG tablet Take 25-50 mg by mouth every 6 (six) hours as needed (A-FIB).      . Multiple Vitamin (MULITIVITAMIN WITH MINERALS) TABS Take 1 tablet by mouth daily.      . [DISCONTINUED] acetaminophen (TYLENOL) 500 MG tablet Take 1,000 mg by mouth every 6 (six) hours as needed for mild pain or moderate pain.      . pantoprazole (PROTONIX) 40 MG tablet Take 40 mg by mouth daily as needed (heartburn).      . Rivaroxaban (XARELTO) 20 MG TABS tablet Take 20 mg by mouth every morning.        Inpatient Medications:  . cholecalciferol  4,000 Units Oral Daily  . diazepam  5 mg Oral QHS  . [START ON 10/25/2013] diltiazem  360 mg Oral Daily  . docusate sodium  100 mg Oral BID  . metoprolol succinate  100 mg Oral BID  . multivitamin with minerals  1 tablet Oral Daily  . omega-3 acid ethyl esters  2 g Oral Daily  . oxyCODONE  5-15 mg Oral Q4H  . polyethylene glycol  17 g Oral BID  . Rivaroxaban  15 mg Oral BID WC    Allergies:  Allergies  Allergen Reactions  . Avelox [Moxifloxacin Hcl In Nacl] Other (See Comments)    Numbness & tingling Peripheral neuropathy    History   Social History  . Marital Status: Married    Spouse Name: N/A    Number of Children: 2  . Years of Education: N/A   Occupational History  . [employer]     Workers Comp   Social History Main Topics  . Smoking status: Never Smoker   . Smokeless tobacco: Never Used  . Alcohol Use: Yes     Comment: rare social  . Drug Use: No  . Sexual Activity: Yes     Comment: has periods q 3-4 months- no possiblity pregnant per pt.07-16-11   Other Topics Concern  . Not on file   Social History Narrative   Daily Caffeine    Pt lives in [place] with  spouse.  She works as a workers Dance movement psychotherapist.     Family History  Problem Relation Age of Onset  . Diabetes Maternal Grandfather   . Colon cancer Neg Hx   . Diverticulitis Brother     x 2  . Diverticulitis Paternal Grandmother     BP 125/66  Pulse 98  Temp(Src) 98.5 F (36.9 C) (Oral)  Resp 18  Ht 5\' 11"  (1.803 m)  Wt 260 lb (117.935 kg)  BMI 36.28 kg/m2  SpO2 96%  LMP 04/17/2012  Physical Exam: Filed Vitals:   10/23/13 1435 10/23/13 2236 10/24/13 0521 10/24/13 1025  BP: 120/70 121/53 127/61 125/66  Pulse: 114 92 98   Temp: 97.8 F (36.6 C) 97.8 F (36.6 C) 98.5 F (36.9 C)   TempSrc: Oral Oral Oral   Resp: 20 20 18    Height:      Weight:      SpO2: 98% 96% 96%     GEN- The patient is well appearing, alert and oriented x 3 today.   Head- normocephalic, atraumatic Eyes-  Sclera clear, conjunctiva pink Ears- hearing intact Oropharynx- clear Neck- supple, no JVP Lymph- no cervical lymphadenopathy Lungs- Clear to ausculation bilaterally, normal work of breathing Heart- irregular rate and rhythm, no murmurs, rubs or gallops, PMI not laterally displaced GI- soft, NT, ND, + BS Extremities- no clubbing, cyanosis, moderate L leg edema MS- no significant deformity or atrophy Skin- no rash or lesion Psych- euthymic mood, full affect Neuro- strength and sensation are intact   Labs:   Lab Results  Component Value Date   WBC 8.1 10/23/2013   HGB 10.1* 10/23/2013   HCT 30.5* 10/23/2013   MCV 85.4 10/23/2013   PLT 313 10/23/2013     Recent Labs Lab 10/22/13 1055  NA 130*  K 3.5*  CL 100  CO2 26  BUN 9  CREATININE 0.75  CALCIUM 8.6  GLUCOSE 119*    Radiology/Studies: Dg Chest 2 View 10/16/2013   CLINICAL DATA:  Preoperative respiratory examination for hip replacement. History of atrial fibrillation.  EXAM: CHEST  2 VIEW  COMPARISON:  DG CHEST 2 VIEW dated 12/24/2011; DG CHEST 2 VIEW dated 02/25/2006  FINDINGS: The heart size and mediastinal contours are stable.  Asymmetric right suprahilar linear density on the frontal examination is unchanged and likely vascular. The lungs are clear. There is no pleural effusion or pneumothorax. Patient is status post lower cervical fusion.  IMPRESSION: Stable chest.  No active cardiopulmonary process.   Electronically Signed   By: Camie Patience M.D.   On: 10/16/2013 16:12    IOM:BTDHRC fibrillation, ventricular rate 90, QRS 90  TELEMETRY: atrial fibrillation, ventricular rates 90-130  Assessment and Plan:  1. Persistent afib The patient is well known to me.  She has developed progressive afib.  Rate control has been difficult while here (likely exacerbated by post op catechols, pain, dvt, etc).  I think that in this acute setting our ability to maintain sinus rhythm is low.  I would therefore favor a rate control strategy. I will increase diltiazem to 360mg  daily.  Her metoprolol is now 100mg  BID.  I have stopped digoxin which has been shown to carry an increase in mortality with afib. She has prn metoprolol at home which she can take for symptomatic palpitations.  2. DVT Continue xarelto 15mg  BID x total duration of 21 days then 20mg  daily thereafter.  OK to discharge to home from my standpoint.  I will ask my nurse to follow closely at home.  I will see in the office in 2 weeks.  Please call with questions.

## 2013-10-24 NOTE — Progress Notes (Signed)
Physical Therapy Treatment Patient Details Name: Destiny Dennis MRN: [id number] DOB: 01-04-1961 Today's Date: 10/24/2013 Time: 1120-1140 PT Time Calculation (min): 20 min  PT Assessment / Plan / Recommendation  History of Present Illness 53 yo female s/p L THA-DA 10/17/13 with post op afib with RVR and + L LE DVT. Hx of multiple knee surgeries.    PT Comments   Session limited by HR with RN receiving notifications from cardiac monitoring unit (black box) so deferred further activity and pt just returned back to bed.     Follow Up Recommendations  Home health PT     Does the patient have the potential to tolerate intense rehabilitation     Barriers to Discharge        Equipment Recommendations  None recommended by PT    Recommendations for Other Services    Frequency 7X/week   Progress towards PT Goals Progress towards PT goals: Progressing toward goals  Plan Current plan remains appropriate    Precautions / Restrictions Precautions Precautions: None Precaution Comments: A fib; monitor HR Restrictions Weight Bearing Restrictions: Yes LLE Weight Bearing: Weight bearing as tolerated   Pertinent Vitals/Pain L throbbing thigh pain, States MD and RN aware    Mobility  Bed Mobility Overal bed mobility: Needs Assistance Bed Mobility: Supine to Sit;Sit to Supine Supine to sit: Min assist Sit to supine: Supervision General bed mobility comments: Assist for L LE over EOB, pt able to self assist L LE onto bed using UEs Transfers Overall transfer level: Needs assistance Equipment used: Rolling walker (2 wheeled) Transfers: Sit to/from Stand Sit to Stand: Supervision General transfer comment: close guard for safety Ambulation/Gait Ambulation/Gait assistance: Supervision Ambulation Distance (Feet): 20 Feet (x2) Assistive device: Rolling walker (2 wheeled) Gait Pattern/deviations: Step-through pattern;Decreased dorsiflexion - left;Antalgic General Gait Details: slow gait  speed. verbal cues for step length and heel strike; no c/o dizziness or nausea  however HR did elevate to 170 after using bathroom then hallway ambulation of 20 feet so brought chair for pt to sit; HR remained near 150 with various fluctuations so deferred any further ambulation just back to room    Exercises     PT Diagnosis:    PT Problem List:   PT Treatment Interventions:     PT Goals (current goals can now be found in the care plan section)    Visit Information  Last PT Received On: 10/24/13 Assistance Needed: +1 History of Present Illness: 53 yo female s/p L THA-DA 10/17/13 with post op afib with RVR and + L LE DVT. Hx of multiple knee surgeries.     Subjective Data      Cognition  Cognition Arousal/Alertness: Awake/alert Behavior During Therapy: WFL for tasks assessed/performed Overall Cognitive Status: Within Functional Limits for tasks assessed    Balance     End of Session PT - End of Session Activity Tolerance: Treatment limited secondary to medical complications (Comment) (elevated HR, afib) Patient left: in bed;with call bell/phone within reach;with family/visitor present Nurse Communication:  (RN in hallway monitoring HR )   GP     Destiny Dennis,[name] 10/24/2013, 1:57 PM Carmelia Bake, PT, DPT 10/24/2013 Pager: [phone]

## 2013-10-24 NOTE — Progress Notes (Signed)
Patient ambulating with physical therapy and heart rate started elevating between 150-170, patient denies any distress, helped patient back in bed, while resting heart rate down between 88-120 still in A-fib. Dr. Rayann Heman informed.

## 2013-10-25 ENCOUNTER — Telehealth: Payer: Self-pay | Admitting: *Deleted

## 2013-10-25 NOTE — Telephone Encounter (Signed)
Spoke with husband.  She seems to be doing okay.  Her biggest problem is the hip pain.  She is going to call back and schedule a EPH appointment at a time that is good for her.  I asked him about her heart racing episodes and he says he really didn't know if the increase in the medications was helping or not.  She has just gotten home yesterday and hip was the biggest complaint.  She will call to schedule her appointment.

## 2013-10-26 NOTE — Discharge Summary (Signed)
Physician Discharge Summary  Patient ID: Destiny Dennis MRN: [id number] DOB/AGE: 1961-03-27 53 y.o.  Admit date: 10/17/2013 Discharge date: 10/24/2013   Procedures:  Procedure(s) (LRB): LEFT TOTAL HIP ARTHROPLASTY ANTERIOR APPROACH (Left)  Attending Physician:  Dr. Paralee Cancel   Admission Diagnoses:   Left hip OA / pain  Discharge Diagnoses:  Principal Problem:   S/P left THA, AA Active Problems:   Atrial fibrillation   Expected blood loss anemia   Obese   DVT (deep venous thrombosis)  Past Medical History  Diagnosis Date  . Atrial fibrillation 03/05/2007    a. s/p RFCA 12/18/2011, 07/29/12  . 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 Sialoadenitis 03/18/2010  . PVC (premature ventricular contraction)   . History of MRSA infection 2008    superficial skin-cleared easily with doxycycline  . Allergic rhinitis   . Cervical disc disease   . Benign fundic gland polyps of stomach   . Diverticulitis     CT Scan   . Hemorrhoids   . Atrial flutter     a. s/p RFCA 12/18/2011  . Arthritis   . Anxiety     when tachycardia occurs  . GERD 12/23/2009    diet related-not a problem  . Hiatal hernia     small  . Headache(784.0)     migraines occ.    HPI: Destiny Dennis, 53 y.o. female, has a history of pain and functional disability in the left hip(s) due to arthritis and patient has failed non-surgical conservative treatments for greater than 12 weeks to include NSAID's and/or analgesics, corticosteriod injections and activity modification. Onset of symptoms was gradual starting 2+ years ago with gradually worsening course since that time.The patient noted no past surgery on the left hip(s). Patient currently rates pain in the left hip at 8 out of 10 with activity. Patient has worsening of pain with activity and weight bearing, trendelenberg gait, pain that interfers with activities of daily living and pain with passive range of motion. Patient has evidence of periarticular  osteophytes and joint space narrowing by imaging studies. This condition presents safety issues increasing the risk of falls. There is no current active infection. Risks, benefits and expectations were discussed with the patient. Risks including but not limited to the risk of anesthesia, blood clots, nerve damage, blood vessel damage, failure of the prosthesis, infection and up to and including death. Patient understand the risks, benefits and expectations and wishes to proceed with surgery.  PCP: Donia Ast, FNP   Discharged Condition: good  Hospital Course:  Patient underwent the above stated procedure on 10/17/2013. Patient tolerated the procedure well and brought to the recovery room in good condition and subsequently to the telemetry room do to her a-fib.  POD #1 BP: 90/69 ; Pulse: 102 ; Temp: 97.8 F (36.6 C) ; Resp: 18  Patient reports pain as mild, pain controlled. Known a-fib for about 3 weeks, otherwise no events throughout the night. States that her cardiologist is aware of her being in a-fib. She knows how to control her rate while at home, otherwise she will follow up with her cardiologist as they have discussed.  Orthopaedically stable.  LABS  Basename    HGB  11.0  HCT  33.9   POD #2  BP: 102/64 ; Pulse: 101 ; Temp: 98.9 F (37.2 C) ; Resp: 18 Patient reports pain as mild, pain controlled. A-fib events continued throughout the night. Cardiology consult made, agreed to see and follow.  POD #3  BP: 114/55 ; Pulse: 136 ; Temp: 98.1 F (36.7 C) ; Resp: 18  Patient reports pain as moderate, with motion and mild at rest. She has had oxycodone in the past which has helped her a little more than hydrocodone. Would like to try to see if that might help her more while she is doing PT. Orthopaedically her left hip is stable. She continues to be in a-fib, which cardio is working to control her rate.  POD #4 BP: 141/66 ; Pulse: 84-116 ; Temp: 98.2 F (36.8 C) ; Resp:  18 Patient has been experiencing HR 130s+ b/c of her Afib and she is now being followed by cardiology. Pt states she feels fine except for soreness in the left anterior thigh. She denies N/V/F/C, chest pain, SOB, calf pain, or paresthesia b/l.  POD #5 BP: 122/54 ; Pulse: 93 ; Temp: 98.7 F (37.1 C) ; Resp: 18  Mild soreness in left hip.  Currently being treated for uncontrolled A-fib which is delaying discharge.  Patient reports pain as mild. Left hip incision healing well. No rashes or edema. Good early ROM. Continue PT/OT as able.  Will await workup from cardiology about continued treatment and possible TEE.  POD #6 BP: 119/62 ; Pulse: 105 ; Temp: 97.8 F (36.6 C) ; Resp: 19  Patient reports pain as mild. From hip perspective she has done well. Unfortunately she was diagnosed this weekend with DVT involving her left lower extremity. She has had no history of DVT. Was on Xarelto pre-operatively for her a-fib. Spoke with Dr. Beryle Beams about issues at had and will try to help arrange for follow up with him in April Neurovascular intact, incision: dressing C/D/I, compartment soft and diffuse left lower extremity swelling particularly in thigh region.   POD #7 BP: 105/64 ; Pulse: 85 ; Temp: 98 F (36.7 C) ; Resp: 16  Patient reports pain as mild, pain controlled. She is frustrated with her lack of a concrete plan from cardiology. Dr. Alvan Dame to discuss with Dr. Rayann Heman to come up with a plan. Orthopaedically stable, have been placed on Xarelto for DVT treatment. Ready to be discharged home from an orthopaedic standpoint.  Dr Rayann Heman saw the patient and had a final decision on meds and treatment from a cardio standpoint.  He is good with her being discharged home. Will arrange follow up with him in 2-3 weeks. Neurovascular intact, incision: dressing C/D/I, compartment soft and diffuse left lower extremity swelling particularly in thigh region.   Discharge Exam: General appearance: alert, cooperative  and no distress Extremities: no ulcers, gangrene or trophic changes  Disposition:    Home-Health Care Svc with follow up in 2 weeks   Follow-up Information   Follow up with Mauri Pole, MD. Schedule an appointment as soon as possible for a visit in 2 weeks.   Specialty:  Orthopedic Surgery   Contact information:   [address] [place] [address] [phone]       Follow up with [name], MD. Schedule an appointment as soon as possible for a visit in 2 weeks.   Specialty:  Cardiology   Contact information:   [place] [address] [phone]       Discharge Orders   Future Appointments Provider Department Dept Phone   11/07/2013 10:10 AM [facility] [phone]   Please wear two piece clothing and wear no powder or deodorant. Please arrive 15 minutes early prior  to your appointment time.   Future Orders Complete By Expires   Call MD / Call 911  As directed    Comments:     If you experience chest pain or shortness of breath, CALL 911 and be transported to the hospital emergency room.  If you develope a fever above 101 F, pus (white drainage) or increased drainage or redness at the wound, or calf pain, call your surgeon's office.   Change dressing  As directed    Comments:     Maintain surgical dressing for 10-14 days, or until follow up in the clinic.   Constipation Prevention  As directed    Comments:     Drink plenty of fluids.  Prune juice may be helpful.  You may use a stool softener, such as Colace (over the counter) 100 mg twice a day.  Use MiraLax (over the counter) for constipation as needed.   Diet - low sodium heart healthy  As directed    Discharge instructions  As directed    Comments:     Maintain surgical dressing for 10-14 days, or until follow up in the clinic. Follow up in 2 weeks at [facility]. Call with any questions or concerns.   Driving  restrictions  As directed    Comments:     No driving for 4 weeks   Increase activity slowly as tolerated  As directed    TED hose  As directed    Comments:     Use stockings (TED hose) for 2 weeks on both leg(s).  You may remove them at night for sleeping.   Weight bearing as tolerated  As directed    Questions:     Laterality:     Extremity:          Medication List    STOP taking these medications       acetaminophen 500 MG tablet  Commonly known as:  TYLENOL      TAKE these medications       BERBERINE COMPLEX PO  Take 1 tablet by mouth 2 (two) times daily.     diazepam 5 MG tablet  Commonly known as:  VALIUM  Take 5 mg by mouth at bedtime.     diltiazem 360 MG 24 hr capsule  Commonly known as:  CARDIZEM CD  Take 1 capsule (360 mg total) by mouth daily.     DSS 100 MG Caps  Take 100 mg by mouth 2 (two) times daily.     fish oil-omega-3 fatty acids 1000 MG capsule  Take 2 g by mouth daily.     methocarbamol 500 MG tablet  Commonly known as:  ROBAXIN  Take 1 tablet (500 mg total) by mouth every 6 (six) hours as needed for muscle spasms.     metoprolol succinate 100 MG 24 hr tablet  Commonly known as:  TOPROL-XL  Take 1 tablet (100 mg total) by mouth 2 (two) times daily. Take with or immediately following a meal.     metoprolol tartrate 25 MG tablet  Commonly known as:  LOPRESSOR  Take 25-50 mg by mouth every 6 (six) hours as needed (A-FIB).     multivitamin with minerals Tabs tablet  Take 1 tablet by mouth daily.     oxyCODONE 5 MG immediate release tablet  Commonly known as:  Oxy IR/ROXICODONE  Take 1-3 tablets (5-15 mg total) by mouth every 4 (four) hours as needed for severe pain.     pantoprazole 40 MG  tablet  Commonly known as:  PROTONIX  Take 40 mg by mouth daily as needed (heartburn).     polyethylene glycol packet  Commonly known as:  MIRALAX / GLYCOLAX  Take 17 g by mouth 2 (two) times daily.     Rivaroxaban 15 MG Tabs tablet  Commonly  known as:  XARELTO  Take 1 tablet (15 mg total) by mouth 2 (two) times daily with a meal.     Rivaroxaban 20 MG Tabs tablet  Commonly known as:  XARELTO  Take 1 tablet (20 mg total) by mouth every morning.     Vitamin D3 2000 UNITS capsule  Take 4,000 Units by mouth daily.         Signed: [place]. Chattie Greeson   PAC  10/26/2013, 8:48 AM

## 2013-10-30 ENCOUNTER — Telehealth: Payer: Self-pay | Admitting: Internal Medicine

## 2013-10-30 NOTE — Telephone Encounter (Signed)
New message        Pt would like to see dr allred in one week. Offered pt April 22 which is next available. Pt declined and asked to speak to you (kelly). Please call her.

## 2013-10-31 NOTE — Telephone Encounter (Signed)
Scheduled on 3/30.  Okay per Dr Rayann Heman  Patient aware

## 2013-11-07 ENCOUNTER — Ambulatory Visit: Payer: Managed Care, Other (non HMO)

## 2013-11-13 ENCOUNTER — Ambulatory Visit (INDEPENDENT_AMBULATORY_CARE_PROVIDER_SITE_OTHER): Payer: Managed Care, Other (non HMO) | Admitting: Internal Medicine

## 2013-11-13 ENCOUNTER — Encounter: Payer: Self-pay | Admitting: Internal Medicine

## 2013-11-13 ENCOUNTER — Other Ambulatory Visit: Payer: Self-pay

## 2013-11-13 VITALS — BP 122/76 | HR 85 | Ht 71.0 in | Wt 254.0 lb

## 2013-11-13 DIAGNOSIS — I4891 Unspecified atrial fibrillation: Secondary | ICD-10-CM

## 2013-11-13 DIAGNOSIS — I82409 Acute embolism and thrombosis of unspecified deep veins of unspecified lower extremity: Secondary | ICD-10-CM

## 2013-11-13 NOTE — Progress Notes (Signed)
PCP: CAMPBELL, PADONDA BOYD, FNP Primary Cardiologist:  Dr Clide Dales is a 53 y.o. female who presents today for routine electrophysiology followup.  She recently underwent hip surgery.  This was complicated by afib with RVR as well as R leg DVT.   She remains in afib but her V rates are better controlled. Today, she denies symptoms of chest pain, shortness of breath,  lower extremity edema, dizziness, presyncope, or syncope.  The patient is otherwise without complaint today.   Past Medical History  Diagnosis Date  . Persistent atrial fibrillation 03/05/2007    a. s/p RFCA 12/18/2011, 07/29/12  . 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 Sialoadenitis 03/18/2010  . PVC (premature ventricular contraction)   . History of MRSA infection 2008    superficial skin-cleared easily with doxycycline  . Allergic rhinitis   . Cervical disc disease   . Benign fundic gland polyps of stomach   . Diverticulitis     CT Scan   . Hemorrhoids   . Atrial flutter     a. s/p RFCA 12/18/2011  . Arthritis   . Anxiety     when tachycardia occurs  . GERD 12/23/2009    diet related-not a problem  . Hiatal hernia     small  . Headache(784.0)     migraines occ.  Marland Kitchen DVT (deep vein thrombosis) in pregnancy     s/p hip surgery   Past Surgical History  Procedure Laterality Date  . Knee surgery      x 9 Left Knee  . Upper gastrointestinal endoscopy  01/30/2010    hiatal hernia, fundic gland polyps  . Shoulder surgery      Right   . Elbow surgery      Right  . Foot surgery      Right   . Cervical fusion    . Cesarean section      x 2  . Wrist surgery      Left   . Cholecystectomy    . Flexible sigmoidoscopy      1990's  . Colonoscopy  07/16/2011    diverticulosis  . Tee without cardioversion  12/17/2011    Procedure: TRANSESOPHAGEAL ECHOCARDIOGRAM (TEE);  Surgeon: Larey Dresser, MD;  Location: [facility] ENDOSCOPY;  Service: Cardiovascular;  Laterality: N/A;  . Atrial fibrillation and atrial  flutter ablation  12/17/11, 07/29/12    PVI and CTI ablations by Dr Rayann Heman  . Tee without cardioversion  07/28/2012    Procedure: TRANSESOPHAGEAL ECHOCARDIOGRAM (TEE);  Surgeon: Thayer Headings, MD;  Location: [facility] ENDOSCOPY;  Service: Cardiovascular;  Laterality: N/A;  . Atrial fibrillation ablation  07/29/2012    PVI by Dr Rayann Heman (second procedure)  . Total knee arthroplasty Right 12/12/2012    Procedure: RIGHT TOTAL KNEE ARTHROPLASTY;  Surgeon: Mauri Pole, MD;  Location: [facility] ORS;  Service: Orthopedics;  Laterality: Right;  . I&d knee with poly exchange Left 12/12/2012    Procedure: LEFT KNEE EXCISION SAPHENOUS NEUROMA/OPEN SCAR DEBRIDEMENT/POLY EXCHANGE/NERVE EXCISION;  Surgeon: Mauri Pole, MD;  Location: [facility] ORS;  Service: Orthopedics;  Laterality: Left;  . Joint replacement Bilateral     4'14  . Total hip arthroplasty Left 10/17/2013    Procedure: LEFT TOTAL HIP ARTHROPLASTY ANTERIOR APPROACH;  Surgeon: Mauri Pole, MD;  Location: [facility] ORS;  Service: Orthopedics;  Laterality: Left;    Current Outpatient Prescriptions  Medication Sig Dispense Refill  . cyclobenzaprine (FLEXERIL) 10 MG tablet Take 1 tablet by mouth daily.      Marland Kitchen  diazepam (VALIUM) 5 MG tablet Take 5 mg by mouth as needed.       . diltiazem (CARDIZEM CD) 360 MG 24 hr capsule Take 1 capsule (360 mg total) by mouth daily.  30 capsule  0  . methocarbamol (ROBAXIN) 500 MG tablet Take 1 tablet (500 mg total) by mouth every 6 (six) hours as needed for muscle spasms.  50 tablet  0  . metoprolol succinate (TOPROL-XL) 100 MG 24 hr tablet Take 1 tablet (100 mg total) by mouth 2 (two) times daily. Take with or immediately following a meal.  60 tablet  0  . metoprolol tartrate (LOPRESSOR) 25 MG tablet Take 25-50 mg by mouth every 6 (six) hours as needed (A-FIB).      Marland Kitchen oxyCODONE (OXY IR/ROXICODONE) 5 MG immediate release tablet Take 1-3 tablets (5-15 mg total) by mouth every 4 (four) hours as needed for severe pain.  100 tablet  0  .  pantoprazole (PROTONIX) 40 MG tablet Take 40 mg by mouth daily as needed (heartburn).      . polyethylene glycol (MIRALAX / GLYCOLAX) packet Take 17 g by mouth daily as needed.      . Rivaroxaban (XARELTO) 20 MG TABS tablet Take 1 tablet (20 mg total) by mouth every morning.  30 tablet    . Cholecalciferol (VITAMIN D3) 2000 UNITS capsule Take 4,000 Units by mouth daily.      . fish oil-omega-3 fatty acids 1000 MG capsule Take 2 g by mouth daily.      . Multiple Vitamin (MULITIVITAMIN WITH MINERALS) TABS Take 1 tablet by mouth daily.      . [DISCONTINUED] flecainide (TAMBOCOR) 100 MG tablet Take 1 tablet (100 mg total) by mouth 2 (two) times daily.  60 tablet  12   No current facility-administered medications for this visit.    Physical Exam: Filed Vitals:   11/13/13 1438  BP: 122/76  Pulse: 85  Height: 5\' 11"  (1.803 m)  Weight: 254 lb (115.214 kg)    GEN- The patient is well appearing, alert and oriented x 3 today.   Head- normocephalic, atraumatic Eyes-  Sclera clear, conjunctiva pink Ears- hearing intact Oropharynx- clear Lungs- Clear to ausculation bilaterally, normal work of breathing Heart- irregular rate and rhythm, no murmurs, rubs or gallops, PMI not laterally displaced GI- soft, NT, ND, + BS Extremities- no clubbing, cyanosis, or edema  ekg today reveals afib, V rate is 85 bpm, nonspecific St/T changes  Assessment and Plan:  1. Afib Continue xarelto and metoprolol She did not tolerate multaq.  She continues to be symptomatic with afib.  I think we should proceed with [place] and she agrees.  We will therefore schedule for elective admission for tikosyn load at the next available time.   2. DVT Continue xarelto  Return to see me 4 weeks after admission for tikosyn

## 2013-11-13 NOTE — Patient Instructions (Signed)
Your physician recommends that you schedule a follow-up appointment after Tikosyn Load

## 2013-11-21 ENCOUNTER — Other Ambulatory Visit: Payer: Self-pay | Admitting: Internal Medicine

## 2013-12-04 ENCOUNTER — Telehealth: Payer: Self-pay | Admitting: Internal Medicine

## 2013-12-04 NOTE — Telephone Encounter (Signed)
Patient wanted to change her tikosyn load from 4/23 to 4/24 for child care reasons.  Appointment changed, and I notified the girls who handle pre-cert.  Patient hasn't missed any doses of Xarelto.

## 2013-12-04 NOTE — Telephone Encounter (Signed)
New Message  Pt requests a call back from [name] to discuss her [facility] appt/ (No further details) Please call

## 2013-12-05 ENCOUNTER — Ambulatory Visit
Admission: RE | Admit: 2013-12-05 | Discharge: 2013-12-05 | Disposition: A | Discharge status: Home / Self Care | Payer: Self-pay | Source: Ambulatory Visit

## 2013-12-05 DIAGNOSIS — Z9889 Other specified postprocedural states: Secondary | ICD-10-CM

## 2013-12-05 DIAGNOSIS — Z1231 Encounter for screening mammogram for malignant neoplasm of breast: Secondary | ICD-10-CM

## 2013-12-07 ENCOUNTER — Ambulatory Visit: Payer: Managed Care, Other (non HMO) | Admitting: Pharmacist

## 2013-12-08 ENCOUNTER — Inpatient Hospital Stay (HOSPITAL_COMMUNITY)
Admission: AD | Admit: 2013-12-08 | Discharge: 2013-12-11 | DRG: 310 | Disposition: A | Discharge status: Home / Self Care | Payer: Managed Care, Other (non HMO) | Source: Ambulatory Visit | Attending: Internal Medicine | Admitting: Internal Medicine

## 2013-12-08 ENCOUNTER — Encounter (HOSPITAL_COMMUNITY): Payer: Self-pay

## 2013-12-08 ENCOUNTER — Ambulatory Visit (INDEPENDENT_AMBULATORY_CARE_PROVIDER_SITE_OTHER): Payer: Managed Care, Other (non HMO) | Admitting: Pharmacist

## 2013-12-08 VITALS — BP 124/78 | HR 84 | Ht 71.0 in | Wt 254.0 lb

## 2013-12-08 DIAGNOSIS — Z6835 Body mass index (BMI) 35.0-35.9, adult: Secondary | ICD-10-CM

## 2013-12-08 DIAGNOSIS — Z9089 Acquired absence of other organs: Secondary | ICD-10-CM

## 2013-12-08 DIAGNOSIS — Z96649 Presence of unspecified artificial hip joint: Secondary | ICD-10-CM

## 2013-12-08 DIAGNOSIS — R001 Bradycardia, unspecified: Secondary | ICD-10-CM

## 2013-12-08 DIAGNOSIS — I4891 Unspecified atrial fibrillation: Principal | ICD-10-CM | POA: Diagnosis present

## 2013-12-08 DIAGNOSIS — Z96659 Presence of unspecified artificial knee joint: Secondary | ICD-10-CM

## 2013-12-08 DIAGNOSIS — F411 Generalized anxiety disorder: Secondary | ICD-10-CM | POA: Diagnosis present

## 2013-12-08 DIAGNOSIS — Z833 Family history of diabetes mellitus: Secondary | ICD-10-CM

## 2013-12-08 DIAGNOSIS — Z79899 Other long term (current) drug therapy: Secondary | ICD-10-CM

## 2013-12-08 DIAGNOSIS — Z7901 Long term (current) use of anticoagulants: Secondary | ICD-10-CM

## 2013-12-08 DIAGNOSIS — E669 Obesity, unspecified: Secondary | ICD-10-CM | POA: Diagnosis present

## 2013-12-08 DIAGNOSIS — Z86718 Personal history of other venous thrombosis and embolism: Secondary | ICD-10-CM

## 2013-12-08 DIAGNOSIS — I498 Other specified cardiac arrhythmias: Secondary | ICD-10-CM | POA: Diagnosis present

## 2013-12-08 DIAGNOSIS — I10 Essential (primary) hypertension: Secondary | ICD-10-CM | POA: Diagnosis present

## 2013-12-08 DIAGNOSIS — K219 Gastro-esophageal reflux disease without esophagitis: Secondary | ICD-10-CM | POA: Diagnosis present

## 2013-12-08 DIAGNOSIS — Z981 Arthrodesis status: Secondary | ICD-10-CM

## 2013-12-08 DIAGNOSIS — Z8614 Personal history of Methicillin resistant Staphylococcus aureus infection: Secondary | ICD-10-CM

## 2013-12-08 DIAGNOSIS — I82409 Acute embolism and thrombosis of unspecified deep veins of unspecified lower extremity: Secondary | ICD-10-CM

## 2013-12-08 LAB — BASIC METABOLIC PANEL
BUN: 18 mg/dL (ref 6–23)
CALCIUM: 9.6 mg/dL (ref 8.4–10.5)
CO2: 27 mEq/L (ref 19–32)
Chloride: 103 mEq/L (ref 96–112)
Creat: 0.8 mg/dL (ref 0.50–1.10)
GLUCOSE: 94 mg/dL (ref 70–99)
Potassium: 4.1 mEq/L (ref 3.5–5.3)
SODIUM: 143 meq/L (ref 135–145)

## 2013-12-08 LAB — MAGNESIUM: Magnesium: 2.2 mg/dL (ref 1.5–2.5)

## 2013-12-08 MED ORDER — DIAZEPAM 5 MG PO TABS: 5.0000 mg | ORAL_TABLET | Freq: Every evening | ORAL | Status: DC | PRN

## 2013-12-08 MED ORDER — DOFETILIDE 500 MCG PO CAPS
500.0000 ug | ORAL_CAPSULE | Freq: Two times a day (BID) | ORAL | Status: DC
  Administered 2013-12-08 – 2013-12-11 (×6): 500 ug via ORAL
  Filled 2013-12-08 (×8): qty 1

## 2013-12-08 MED ORDER — SODIUM CHLORIDE 0.9 % IV SOLN: 250.0000 mL | INTRAVENOUS | Status: DC | PRN

## 2013-12-08 MED ORDER — SODIUM CHLORIDE 0.9 % IJ SOLN
3.0000 mL | Freq: Two times a day (BID) | INTRAMUSCULAR | Status: DC
  Administered 2013-12-08 – 2013-12-10 (×3): 3 mL via INTRAVENOUS

## 2013-12-08 MED ORDER — PANTOPRAZOLE SODIUM 40 MG PO TBEC: 40.0000 mg | DELAYED_RELEASE_TABLET | Freq: Every day | ORAL | Status: DC | PRN

## 2013-12-08 MED ORDER — OXYCODONE HCL 5 MG PO TABS
5.0000 mg | ORAL_TABLET | ORAL | Status: DC | PRN
  Administered 2013-12-08: 5 mg via ORAL
  Administered 2013-12-09: 10 mg via ORAL
  Administered 2013-12-10 – 2013-12-11 (×3): 5 mg via ORAL
  Filled 2013-12-08: qty 2
  Filled 2013-12-08 (×3): qty 1
  Filled 2013-12-08: qty 2

## 2013-12-08 MED ORDER — DILTIAZEM HCL ER COATED BEADS 360 MG PO CP24
360.0000 mg | ORAL_CAPSULE | Freq: Every day | ORAL | Status: DC
  Administered 2013-12-09: 360 mg via ORAL
  Filled 2013-12-08 (×3): qty 1

## 2013-12-08 MED ORDER — CYCLOBENZAPRINE HCL 10 MG PO TABS
10.0000 mg | ORAL_TABLET | Freq: Every day | ORAL | Status: DC
Start: 2013-12-08 — End: 2013-12-11
  Administered 2013-12-08 – 2013-12-10 (×3): 10 mg via ORAL
  Filled 2013-12-08 (×4): qty 1

## 2013-12-08 MED ORDER — METOPROLOL TARTRATE 25 MG PO TABS
25.0000 mg | ORAL_TABLET | Freq: Four times a day (QID) | ORAL | Status: DC | PRN
  Filled 2013-12-08: qty 2

## 2013-12-08 MED ORDER — SODIUM CHLORIDE 0.9 % IJ SOLN: 3.0000 mL | INTRAMUSCULAR | Status: DC | PRN

## 2013-12-08 MED ORDER — RIVAROXABAN 20 MG PO TABS
20.0000 mg | ORAL_TABLET | Freq: Every day | ORAL | Status: DC
  Administered 2013-12-09 – 2013-12-11 (×3): 20 mg via ORAL
  Filled 2013-12-08 (×4): qty 1

## 2013-12-08 MED ORDER — METOPROLOL SUCCINATE ER 100 MG PO TB24
100.0000 mg | ORAL_TABLET | Freq: Two times a day (BID) | ORAL | Status: DC
  Administered 2013-12-08 – 2013-12-09 (×3): 100 mg via ORAL
  Filled 2013-12-08 (×6): qty 1

## 2013-12-08 MED ORDER — METHOCARBAMOL 500 MG PO TABS
500.0000 mg | ORAL_TABLET | Freq: Four times a day (QID) | ORAL | Status: DC | PRN
  Filled 2013-12-08: qty 1

## 2013-12-08 MED ORDER — CYCLOBENZAPRINE HCL 10 MG PO TABS: 10.0000 mg | ORAL_TABLET | Freq: Every day | ORAL | Status: DC

## 2013-12-08 NOTE — Assessment & Plan Note (Addendum)
Patient's labs reviewed.  K- 4.1, Mg- 2.2, Scr 0.8 mg/L, CrCl > 100 ml/min.  Acceptable for Tikosyn initiation.  Anticipate starting patient on Tikosyn 500 mcg bid.  QTc-  432 msec which is within acceptable limits for starting Tikosyn.  Patient aware to report to hospital and will be placed on 3W.

## 2013-12-08 NOTE — Progress Notes (Signed)
Pharmacy Consult for Dofetilide (Tikosyn) Iniation  Admit Complaint: 53 y.o. female admitted 12/08/2013 with atrial fibrillation to be initiated on dofetilide.   Assessment:  Patient Exclusion Criteria: If any screening criteria checked as "Yes", then  patient  should NOT receive dofetilide until criteria item is corrected. If "Yes" please indicate correction plan.  YES  NO Patient  Exclusion Criteria Correction Plan  []  [x]  Baseline QTc interval is greater than or equal to 440 msec. IF above YES box checked dofetilide contraindicated unless patient has ICD; then may proceed if QTc 500-550 msec or with known ventricular conduction abnormalities may proceed with QTc 550-600 msec. QTc = 432 msec   []  [x]  Magnesium level is less than 1.8 mEq/l : Last magnesium:  Lab Results  Component Value Date   MG 2.2 12/08/2013         []  [x]  Potassium level is less than 4 mEq/l : Last potassium:  Lab Results  Component Value Date   K 4.1 12/08/2013         []  [x]  Patient is known or suspected to have a digoxin level greater than 2 ng/ml: No results found for this basename: DIGOXIN      []  [x]  Creatinine clearance less than 20 ml/min (calculated using Cockcroft-Gault, actual body weight and serum creatinine): Estimated Creatinine Clearance: 117.5 ml/min (by C-G formula based on Cr of 0.8).    []  [x]  Patient has received drugs known to prolong the QT intervals within the last 48 hours(phenothiazines, tricyclics or tetracyclic antidepressants, erythromycin, H-1 antihistamines, cisapride, fluoroquinolones, azithromycin). Drugs not listed above may have an, as yet, undetected potential to prolong the QT interval, updated information on QT prolonging agents is available at this website:QT prolonging agents   []  [x]  Patient received a dose of hydrochlorothiazide (Oretic) alone or in any combination including triamterene (Dyazide, Maxzide) in the last 48 hours.   []  [x]  Patient received a medication known  to increase dofetilide plasma concentrations prior to initial dofetilide dose:    Trimethoprim (Primsol, Proloprim) in the last 36 hours   Verapamil (Calan, Verelan) in the last 36 hours or a sustained release dose in the last 72 hours   Megestrol (Megace) in the last 5 days    Cimetidine (Tagamet) in the last 6 hours   Ketoconazole (Nizoral) in the last 24 hours   Itraconazole (Sporanox) in the last 48 hours    Prochlorperazine (Compazine) in the last 36 hours    []  [x]  Patient is known to have a history of torsades de pointes; congenital or acquired long QT syndromes.   []  [x]  Patient has received a Class 1 antiarrhythmic with less than 2 half-lives since last dose. (Disopyramide, Quinidine, Procainamide, Lidocaine, Mexiletine, Flecainide, Propafenone)   []  [x]  Patient has received amiodarone therapy in the past 3 months or amiodarone level is greater than 0.3 ng/ml.    Patient has been appropriately anticoagulated with Xarelto.  Ordering provider was confirmed at [email or web address] if they are not listed on the [facility] Prescribers list.  Goal of Therapy:  Follow renal function, electrolytes, potential drug interactions, and dose adjustment. Provide education and 1 week supply at discharge.  Plan:  1.  Initiate dofetilide based on renal function: Select One Calculated CrCl  Dose q12h  [x]  > 60 ml/min 500 mcg  []  40-60 ml/min 250 mcg  []  20-40 ml/min 125 mcg   2. Follow up QTc after the first 5 doses, renal function, electrolytes (K & Mg) daily x 3  days, dose adjustment, success of initiation and facilitate 1 week discharge supply as     clinically indicated.  3. Initiate Tikosyn education video (Call [phone] and ask for video # [phone]).  4. Place Enrollment Form on the chart for discharge supply of dofetilide.   [place], Pharm.D., BCPS Clinical Pharmacist Pager: [phone] 12/08/2013 5:16 PM

## 2013-12-08 NOTE — Progress Notes (Signed)
Destiny Dennis is a 53 y.o. Female who presents today for Tikosyn initiation.  She was seen by Dr.  Rayann Heman 11/13/13 for routine follow-up and was having symptomatic Afib.  She has failed multiple antiarrythmic agents, including Flecainide and Multaq.  She has had 2 different ablation procedures, most recently 07/2012 by Dr. Rayann Heman which seemed to be successful.  She had knee replacement surgery 11/2012 and found to be back in Afib following surgery.  Patient had hip replacement 10/17/2013, and developed a DVT at that time.  Got restarted on Xarelto again following hip replacement surgery and hasn't missed any doses since then.  Patient considered starting on Tikosyn back in 05/2013, but changed her mind as she started feeling better prior to being admitted for loading.  She is symptomatic again now, and after meeting with Dr. Rayann Heman 11/13/13, she is agreeable to start on Tikosyn.  Patient and husband educated on potential side effects of Tikosyn, including QTc prolongation.  They are aware of the importance of compliance and will call the office if she misses more than 2 doses in a row.  Patient given a copay voucher for Tikosyn since she has private insurance to hopefully help with cost of medication.  Reviewed patient's medication list.  She is currently not taking any QTc prolongating medications or contraindicated medications.  She has failed multiple antiarrhythmic agents, including Multaq and flecainide in the past.  She has also had 2 ablation procedures which were ultimately unsuccessful in maintaining NSR.  She has been anticoagulated with Xarelto 20 mg qd, and hasn't missed any doses over the past 4 weeks.  EKG on 12/08/13 reviewed by Dr. Rayann Heman showed Atrial fibrillation with v rate 111 bpm, and QTc of 432 msec.  Current Outpatient Prescriptions  Medication Sig Dispense Refill  . Cholecalciferol (VITAMIN D3) 2000 UNITS capsule Take 4,000 Units by mouth daily.      . cyclobenzaprine (FLEXERIL) 10 MG  tablet Take 1 tablet by mouth daily.      . diazepam (VALIUM) 5 MG tablet Take 5 mg by mouth as needed.       . diltiazem (CARDIZEM CD) 360 MG 24 hr capsule TAKE 1 CAPSULE BY MOUTH ONCE DAILY  30 capsule  0  . fish oil-omega-3 fatty acids 1000 MG capsule Take 2 g by mouth daily.      . methocarbamol (ROBAXIN) 500 MG tablet Take 1 tablet (500 mg total) by mouth every 6 (six) hours as needed for muscle spasms.  50 tablet  0  . metoprolol succinate (TOPROL-XL) 100 MG 24 hr tablet Take 1 tablet (100 mg total) by mouth 2 (two) times daily. Take with or immediately following a meal.  60 tablet  0  . metoprolol tartrate (LOPRESSOR) 25 MG tablet Take 25-50 mg by mouth every 6 (six) hours as needed (A-FIB).      . Multiple Vitamin (MULITIVITAMIN WITH MINERALS) TABS Take 1 tablet by mouth daily.      Marland Kitchen oxyCODONE (OXY IR/ROXICODONE) 5 MG immediate release tablet Take 1-3 tablets (5-15 mg total) by mouth every 4 (four) hours as needed for severe pain.  100 tablet  0  . pantoprazole (PROTONIX) 40 MG tablet Take 40 mg by mouth daily as needed (heartburn).      . polyethylene glycol (MIRALAX / GLYCOLAX) packet Take 17 g by mouth daily as needed.      . Rivaroxaban (XARELTO) 20 MG TABS tablet Take 1 tablet (20 mg total) by mouth every morning.  30 tablet    . [  DISCONTINUED] flecainide (TAMBOCOR) 100 MG tablet Take 1 tablet (100 mg total) by mouth 2 (two) times daily.  60 tablet  12   No current facility-administered medications for this visit.   Allergies  Allergen Reactions  . Avelox [Moxifloxacin Hcl In Nacl] Other (See Comments)    Numbness & tingling Peripheral neuropathy

## 2013-12-08 NOTE — H&P (Signed)
ELECTROPHYSIOLOGY CONSULT NOTE    Patient ID: Destiny Dennis MRN: [id number], DOB/AGE: 51-05-1961 51 y.o.  Admit date: 12/08/2013  Primary Physician: Donia Ast, FNP Primary Cardiologist: Stanford Breed Electrophysiologist: Allred  Reason for Admission: Tikosyn loading for persistent atrial fibrillation  HPI:  Destiny Dennis is a 53 y.o. female who presents today for Tikosyn initiation. She was seen by Dr. Rayann Heman 11/13/13 for routine follow-up and was having symptomatic Afib. She has failed multiple antiarrythmic agents, including Flecainide and Multaq. She has had 2 different ablation procedures, most recently 07/2012 by Dr. Rayann Heman which seemed to be successful. She had knee replacement surgery 11/2012 and found to be back in Afib following surgery. Patient had hip replacement 10/17/2013, and developed a DVT at that time. She was restarted on Xarelto again following hip replacement surgery and hasn't missed any doses since then. Patient considered starting on Tikosyn back in 05/2013, but changed her mind as she started feeling better prior to being admitted for loading. She is symptomatic again now, and after meeting with Dr. Rayann Heman 11/13/13, she is agreeable to start on Tikosyn.  Lab work has been obtained which demonstrated K of 4.1, Mg 2.2.  EKG demonstrates atrial fibrillation, rate 111, QTc 432.   She currently denies chest pain, shortness of breath, or pre-syncope.  She reports symptoms of fatigue with her atrial fibrillation.  ROS is otherwise negative.    Past Medical History  Diagnosis Date  . Persistent atrial fibrillation 03/05/2007    a. s/p RFCA 12/18/2011, 07/29/12  . 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 Sialoadenitis 03/18/2010  . PVC (premature ventricular contraction)   . History of MRSA infection 2008    superficial skin-cleared easily with doxycycline  . Allergic rhinitis   . Cervical disc disease   . Benign fundic gland polyps of stomach   . Diverticulitis      CT Scan   . Hemorrhoids   . Atrial flutter     a. s/p RFCA 12/18/2011  . Arthritis   . Anxiety     when tachycardia occurs  . GERD 12/23/2009    diet related-not a problem  . Hiatal hernia     small  . Headache(784.0)     migraines occ.  Marland Kitchen DVT (deep vein thrombosis) in pregnancy     s/p hip surgery     Surgical History:  Past Surgical History  Procedure Laterality Date  . Knee surgery      x 9 Left Knee  . Upper gastrointestinal endoscopy  01/30/2010    hiatal hernia, fundic gland polyps  . Shoulder surgery      Right   . Elbow surgery      Right  . Foot surgery      Right   . Cervical fusion    . Cesarean section      x 2  . Wrist surgery      Left   . Cholecystectomy    . Flexible sigmoidoscopy      1990's  . Colonoscopy  07/16/2011    diverticulosis  . Tee without cardioversion  12/17/2011    Procedure: TRANSESOPHAGEAL ECHOCARDIOGRAM (TEE);  Surgeon: Larey Dresser, MD;  Location: [facility] ENDOSCOPY;  Service: Cardiovascular;  Laterality: N/A;  . Atrial fibrillation and atrial flutter ablation  12/17/11, 07/29/12    PVI and CTI ablations by Dr Rayann Heman  . Tee without cardioversion  07/28/2012    Procedure: TRANSESOPHAGEAL ECHOCARDIOGRAM (TEE);  Surgeon: Thayer Headings, MD;  Location: [facility];  Service: Cardiovascular;  Laterality: N/A;  . Atrial fibrillation ablation  07/29/2012    PVI by Dr Rayann Heman (second procedure)  . Total knee arthroplasty Right 12/12/2012    Procedure: RIGHT TOTAL KNEE ARTHROPLASTY;  Surgeon: Mauri Pole, MD;  Location: [facility];  Service: Orthopedics;  Laterality: Right;  . I&d knee with poly exchange Left 12/12/2012    Procedure: LEFT KNEE EXCISION SAPHENOUS NEUROMA/OPEN SCAR DEBRIDEMENT/POLY EXCHANGE/NERVE EXCISION;  Surgeon: Mauri Pole, MD;  Location: [facility];  Service: Orthopedics;  Laterality: Left;  . Joint replacement Bilateral     4'14  . Total hip arthroplasty Left 10/17/2013    Procedure: LEFT TOTAL HIP ARTHROPLASTY ANTERIOR  APPROACH;  Surgeon: Mauri Pole, MD;  Location: [facility];  Service: Orthopedics;  Laterality: Left;     Prescriptions prior to admission  Medication Sig Dispense Refill  . Cholecalciferol (VITAMIN D3) 2000 UNITS capsule Take 4,000 Units by mouth daily.      . cyclobenzaprine (FLEXERIL) 10 MG tablet Take 1 tablet by mouth daily.      . diazepam (VALIUM) 5 MG tablet Take 5 mg by mouth as needed.       . diltiazem (CARDIZEM CD) 360 MG 24 hr capsule TAKE 1 CAPSULE BY MOUTH ONCE DAILY  30 capsule  0  . fish oil-omega-3 fatty acids 1000 MG capsule Take 2 g by mouth daily.      . methocarbamol (ROBAXIN) 500 MG tablet Take 1 tablet (500 mg total) by mouth every 6 (six) hours as needed for muscle spasms.  50 tablet  0  . metoprolol succinate (TOPROL-XL) 100 MG 24 hr tablet Take 1 tablet (100 mg total) by mouth 2 (two) times daily. Take with or immediately following a meal.  60 tablet  0  . metoprolol tartrate (LOPRESSOR) 25 MG tablet Take 25-50 mg by mouth every 6 (six) hours as needed (A-FIB).      . Multiple Vitamin (MULITIVITAMIN WITH MINERALS) TABS Take 1 tablet by mouth daily.      Marland Kitchen oxyCODONE (OXY IR/ROXICODONE) 5 MG immediate release tablet Take 1-3 tablets (5-15 mg total) by mouth every 4 (four) hours as needed for severe pain.  100 tablet  0  . pantoprazole (PROTONIX) 40 MG tablet Take 40 mg by mouth daily as needed (heartburn).      . polyethylene glycol (MIRALAX / GLYCOLAX) packet Take 17 g by mouth daily as needed.      . Rivaroxaban (XARELTO) 20 MG TABS tablet Take 1 tablet (20 mg total) by mouth every morning.  30 tablet      Inpatient Medications:   Allergies:  Allergies  Allergen Reactions  . Avelox [Moxifloxacin Hcl In Nacl] Other (See Comments)    Numbness & tingling Peripheral neuropathy    History   Social History  . Marital Status: Married    Spouse Name: N/A    Number of Children: 2  . Years of Education: N/A   Occupational History  . [employer]     Work[employer]   Social History Main Topics  . Smoking status: Never Smoker   . Smokeless tobacco: Never Used  . Alcohol Use: Yes     Comment: rare social  . Drug Use: No  . Sexual Activity: Yes     Comment: has periods q 3-4 months- no possiblity pregnant per pt.07-16-11   Other Topics Concern  . Not on file   Social History Narrative   Daily Caffeine    Pt lives  in [place] with spouse.  She works as a workers Dance movement psychotherapist.     Family History  Problem Relation Age of Onset  . Diabetes Maternal Grandfather   . Colon cancer Neg Hx   . Diverticulitis Brother     x 2  . Diverticulitis Paternal Grandmother      Labs:   Lab Results  Component Value Date   WBC 8.1 10/23/2013   HGB 10.1* 10/23/2013   HCT 30.5* 10/23/2013   MCV 85.4 10/23/2013   PLT 313 10/23/2013    Recent Labs Lab 12/08/13 0949  NA 143  K 4.1  CL 103  CO2 27  BUN 18  CREATININE 0.80  CALCIUM 9.6  GLUCOSE 94    EKG: atrial fibrillation, ventricular rate 111, QRS 432  Assessment/Plan:  1.atrial fibrillation, admit for Tikosyn 2. HTN 3. Chronic anti-coagulation Plan - admit for Tikosyn. Will follow QT and electrolytes. Start 500 mcg q12.   Mikle Bosworth.D.

## 2013-12-09 ENCOUNTER — Other Ambulatory Visit: Payer: Self-pay

## 2013-12-09 DIAGNOSIS — I82409 Acute embolism and thrombosis of unspecified deep veins of unspecified lower extremity: Secondary | ICD-10-CM

## 2013-12-09 LAB — BASIC METABOLIC PANEL
BUN: 16 mg/dL (ref 6–23)
CALCIUM: 9.6 mg/dL (ref 8.4–10.5)
CHLORIDE: 102 meq/L (ref 96–112)
CO2: 24 mEq/L (ref 19–32)
Creatinine, Ser: 0.75 mg/dL (ref 0.50–1.10)
GFR calc Af Amer: >90 mL/min (ref >90)
GFR calc non Af Amer: >90 mL/min (ref >90)
GLUCOSE: 91 mg/dL (ref 70–99)
Potassium: 4.5 mEq/L (ref 3.7–5.3)
Sodium: 141 mEq/L (ref 137–147)

## 2013-12-09 LAB — MAGNESIUM: Magnesium: 2.2 mg/dL (ref 1.5–2.5)

## 2013-12-09 MED ORDER — SODIUM CHLORIDE 0.9 % IV SOLN: 250.0000 mL | INTRAVENOUS | Status: DC

## 2013-12-09 MED ORDER — SODIUM CHLORIDE 0.9 % IJ SOLN
3.0000 mL | Freq: Two times a day (BID) | INTRAMUSCULAR | Status: DC
  Administered 2013-12-09: 3 mL via INTRAVENOUS

## 2013-12-09 MED ORDER — SODIUM CHLORIDE 0.9 % IJ SOLN: 3.0000 mL | INTRAMUSCULAR | Status: DC | PRN

## 2013-12-09 NOTE — Progress Notes (Signed)
   SUBJECTIVE: The patient is doing well today.  At this time, she denies chest pain, shortness of breath, or any new concerns.  Admitted yesterday for Tikosyn loading.   CURRENT MEDICATIONS: . cyclobenzaprine  10 mg Oral QHS  . diltiazem  360 mg Oral Daily  . dofetilide  500 mcg Oral BID  . metoprolol succinate  100 mg Oral BID WC  . rivaroxaban  20 mg Oral Q breakfast  . sodium chloride  3 mL Intravenous Q12H      OBJECTIVE: Physical Exam: Filed Vitals:   12/08/13 1618 12/08/13 2209 12/09/13 0607  BP: 118/77 110/71 100/57  Pulse: 135 52 104  Temp: 98 F (36.7 C) 97.6 F (36.4 C) 97.2 F (36.2 C)  TempSrc: Oral Oral   Resp: 18 18 18   Height: 5\' 11"  (1.803 m)    Weight: 253 lb 8.5 oz (115 kg)    SpO2: 99% 99% 97%   No intake or output data in the 24 hours ending 12/09/13 0731  Telemetry reveals atrial fibrillation with ventricular rates 90-130  GEN- The patient is well appearing, alert and oriented x 3 today.   Head- normocephalic, atraumatic Eyes-  Sclera clear, conjunctiva pink Ears- hearing intact Oropharynx- clear Neck- supple, no JVP Lymph- no cervical lymphadenopathy Lungs- Clear to ausculation bilaterally, normal work of breathing Heart- irregular rate and rhythm, no murmurs, rubs or gallops, PMI not laterally displaced GI- soft, NT, ND, + BS Extremities- no clubbing, cyanosis, or edema Neuro- strength and sensation are intact  LABS: Basic Metabolic Panel:  Recent Labs  12/08/13 0949 12/09/13 0611  NA 143 141  K 4.1 4.5  CL 103 102  CO2 27 24  GLUCOSE 94 91  BUN 18 16  CREATININE 0.80 0.75  CALCIUM 9.6 9.6  MG 2.2 2.2     ASSESSMENT AND PLAN:  Active Problems:   Atrial fibrillation  1. Persistent afib Doing well with tikosyn Continue xarelto Will make NPO after midnight with plans for cardioversion tomorrow if still in afib  2. Recent DVT Place SCDs  3. Obesity Weight loss

## 2013-12-09 NOTE — Progress Notes (Signed)
Pt had 3.1 sec pause, then converted from Afib to SB/SR (59-63), pt asymptomatic, B/P 98/61, MD made aware, will continue to monitor.

## 2013-12-10 LAB — BASIC METABOLIC PANEL
BUN: 18 mg/dL (ref 6–23)
CALCIUM: 8.8 mg/dL (ref 8.4–10.5)
CO2: 27 meq/L (ref 19–32)
Chloride: 101 mEq/L (ref 96–112)
Creatinine, Ser: 0.76 mg/dL (ref 0.50–1.10)
GFR calc Af Amer: >90 mL/min (ref >90)
Glucose, Bld: 85 mg/dL (ref 70–99)
Potassium: 4 mEq/L (ref 3.7–5.3)
SODIUM: 139 meq/L (ref 137–147)

## 2013-12-10 LAB — MAGNESIUM: MAGNESIUM: 2.1 mg/dL (ref 1.5–2.5)

## 2013-12-10 MED ORDER — METOPROLOL SUCCINATE ER 50 MG PO TB24
50.0000 mg | ORAL_TABLET | Freq: Two times a day (BID) | ORAL | Status: DC
  Administered 2013-12-10: 50 mg via ORAL
  Filled 2013-12-10 (×4): qty 1

## 2013-12-10 MED ORDER — ACETAMINOPHEN 325 MG PO TABS
650.0000 mg | ORAL_TABLET | Freq: Four times a day (QID) | ORAL | Status: DC | PRN
  Administered 2013-12-10 (×2): 650 mg via ORAL
  Filled 2013-12-10 (×2): qty 2

## 2013-12-10 NOTE — Progress Notes (Signed)
   SUBJECTIVE: The patient is doing well today.  At this time, she denies chest pain, shortness of breath, or any new concerns. Now in sinus rhythm   CURRENT MEDICATIONS: . cyclobenzaprine  10 mg Oral QHS  . dofetilide  500 mcg Oral BID  . metoprolol succinate  50 mg Oral BID WC  . rivaroxaban  20 mg Oral Q breakfast  . sodium chloride  3 mL Intravenous Q12H  . sodium chloride  3 mL Intravenous Q12H   . sodium chloride      OBJECTIVE: Physical Exam: Filed Vitals:   12/09/13 1100 12/09/13 1424 12/09/13 2100 12/10/13 0500  BP: 103/57 111/66 111/64 113/65  Pulse: 61 67 62 53  Temp:  98 F (36.7 C) 98 F (36.7 C) 97.9 F (36.6 C)  TempSrc:  Oral    Resp:  18 16 16   Height:      Weight:      SpO2:  100% 100% 99%   No intake or output data in the 24 hours ending 12/10/13 0805  Telemetry reveals sinus rhythm  GEN- The patient is well appearing, alert and oriented x 3 today.   Head- normocephalic, atraumatic Eyes-  Sclera clear, conjunctiva pink Ears- hearing intact Oropharynx- clear Neck- supple, no JVP Lymph- no cervical lymphadenopathy Lungs- Clear to ausculation bilaterally, normal work of breathing Heart- irregular rate and rhythm, no murmurs, rubs or gallops, PMI not laterally displaced GI- soft, NT, ND, + BS Extremities- no clubbing, cyanosis, or edema Neuro- strength and sensation are intact  LABS: Basic Metabolic Panel:  Recent Labs  12/08/13 0949 12/09/13 0611  NA 143 141  K 4.1 4.5  CL 103 102  CO2 27 24  GLUCOSE 94 91  BUN 18 16  CREATININE 0.80 0.75  CALCIUM 9.6 9.6  MG 2.2 2.2     ASSESSMENT AND PLAN:  Active Problems:   Atrial fibrillation  1. Persistent afib Doing well with tikosyn, qtc is stable Continue xarelto Stop diltiazem and decrease metoprolol now that she is in sinus  2. Recent DVT Place SCDs  3. Obesity Weight loss   Hopefully home tomorrow am

## 2013-12-10 NOTE — Progress Notes (Signed)
Follow up EKG obtained 3 hours after PM Tikosyn dose.

## 2013-12-11 DIAGNOSIS — I498 Other specified cardiac arrhythmias: Secondary | ICD-10-CM

## 2013-12-11 LAB — BASIC METABOLIC PANEL
BUN: 17 mg/dL (ref 6–23)
CO2: 23 meq/L (ref 19–32)
Calcium: 8.9 mg/dL (ref 8.4–10.5)
Chloride: 102 mEq/L (ref 96–112)
Creatinine, Ser: 0.71 mg/dL (ref 0.50–1.10)
GFR calc Af Amer: >90 mL/min (ref >90)
Glucose, Bld: 93 mg/dL (ref 70–99)
POTASSIUM: 4 meq/L (ref 3.7–5.3)
SODIUM: 139 meq/L (ref 137–147)

## 2013-12-11 LAB — MAGNESIUM: MAGNESIUM: 2.1 mg/dL (ref 1.5–2.5)

## 2013-12-11 MED ORDER — METOPROLOL SUCCINATE ER 25 MG PO TB24
25.0000 mg | ORAL_TABLET | Freq: Two times a day (BID) | ORAL | Status: DC
  Filled 2013-12-11 (×2): qty 1

## 2013-12-11 MED ORDER — METOPROLOL SUCCINATE ER 25 MG PO TB24: 25.0000 mg | ORAL_TABLET | Freq: Two times a day (BID) | ORAL | Status: DC

## 2013-12-11 MED ORDER — DOFETILIDE 500 MCG PO CAPS: 500.0000 ug | ORAL_CAPSULE | Freq: Two times a day (BID) | ORAL | Status: DC

## 2013-12-11 NOTE — Progress Notes (Signed)
Post Tikosyn ECG reviewed by Dr. Rayann Heman. Destiny Dennis is stable for discharge home today.

## 2013-12-11 NOTE — Discharge Summary (Signed)
ELECTROPHYSIOLOGY DISCHARGE SUMMARY   Patient ID: CANARY FISTER,  MRN: [id number], DOB/AGE: Jul 15, 1961 53 y.o.  Admit date: 12/08/2013 Discharge date: 12/11/2013  Primary Care Physician: Megan Salon, [place]  Primary Cardiologist: Stanford Breed, MD  Primary EP: Rayann Heman, MD   Primary Discharge Diagnosis:  Persistent atrial fibrillation s/p AAD drug loading with Tikosyn   Secondary Discharge Diagnoses:  Atrial flutter s/p ablation May 2013  Recent DVT  GERD   Procedures This Admission:  None   History:  Destiny Dennis is a 53 year old woman who presented 12/08/2013 for Tikosyn initiation. She was seen by Dr. Rayann Heman 11/13/13 for routine follow-up and reported increased episodes of symptomatic AFib. She has failed multiple antiarrythmic agents, including flecainide and Multaq. She has had 2 different ablation procedures, most recently Dec 2013. She had knee replacement surgery April 2014 and found to be back in AFib following surgery. She had hip replacement surgery March 2015 and developed post-op DVT at that time. She was restarted on Xarelto and hasn't missed any doses since then. On 12/08/2013, lab work was done which demonstrated potassium 4.1 and magnesium 2.2. Normal serum Cr. ECG on admission demonstrated atrial fibrillation with V rate 111 bpm, corrected QTc 432. Risks, benefits, and alternatives to Tikosyn were reviewed with her and she wished to proceed.   Hospital Course:  Ms. Noller was admitted on 12/08/2013 and Tikosyn was initiated. Renal function and electrolytes were followed during the hospitalization and stable. On 12/10/2013 she converted to SR. Her QTc remained stable. Ms. Oppenheimer continued to be monitored until discharge on telemetry which demonstrated sinus rhythm without arrhythmia. She was bradycardic. Diltiazem was discontinued and metoprolol dose reduced. She has been seen, examined and deemed stable for discharge home today by Dr. [name].   Discharge Vitals: Blood  pressure 129/68, pulse 44, temperature 97.3 F (36.3 C), temperature source Oral, resp. rate 18, height 5\' 11"  (1.803 m), weight 253 lb 8.5 oz (115 kg), last menstrual period 04/17/2012, SpO2 100.00%.   Labs: Lab Results  Component Value Date   WBC 8.1 10/23/2013   HGB 10.1* 10/23/2013   HCT 30.5* 10/23/2013   MCV 85.4 10/23/2013   PLT 313 10/23/2013     Recent Labs Lab 12/11/13 0430  NA 139  K 4.0  CL 102  CO2 23  BUN 17  CREATININE 0.71  CALCIUM 8.9  GLUCOSE 93  Magnesium 2.1   Disposition:  The patient is being discharged in stable condition.  Follow-up:     Follow-up Information   Follow up with Aris Georgia, [place] On 12/19/2013. (At 2:00 PM for follow-up with ECG and labs (BMET and Mg))    Specialty:  Pharmacist   Contact information:   [address] [place] [address] [phone]       Follow up with [name], MD On 01/10/2014. (At 10:00 AM)    Specialty:  Cardiology   Contact information:   [address] [place] [address] [phone]      Discharge Medications:    Medication List    STOP taking these medications       diltiazem 360 MG 24 hr capsule  Commonly known as:  TIAZAC      TAKE these medications       cyclobenzaprine 10 MG tablet  Commonly known as:  FLEXERIL  Take 10 mg by mouth daily.     diazepam 5 MG tablet  Commonly known as:  VALIUM  Take 5 mg by mouth  2 (two) times daily as needed for muscle spasms.     dofetilide 500 MCG capsule  Commonly known as:  TIKOSYN  Take 1 capsule (500 mcg total) by mouth every 12 (twelve) hours.     fish oil-omega-3 fatty acids 1000 MG capsule  Take 2 g by mouth daily.     methocarbamol 500 MG tablet  Commonly known as:  ROBAXIN  Take 1 tablet (500 mg total) by mouth every 6 (six) hours as needed for muscle spasms.     metoprolol succinate 25 MG 24 hr tablet  Commonly known as:  TOPROL-XL  Take 1 tablet (25 mg total) by mouth 2 (two) times daily. Take with or  immediately following a meal.     metoprolol tartrate 25 MG tablet  Commonly known as:  LOPRESSOR  Take 25-50 mg by mouth every 6 (six) hours as needed (A-FIB).     multivitamin with minerals Tabs tablet  Take 1 tablet by mouth daily.     oxyCODONE 5 MG immediate release tablet  Commonly known as:  Oxy IR/ROXICODONE  Take 1-3 tablets (5-15 mg total) by mouth every 4 (four) hours as needed for severe pain.     pantoprazole 40 MG tablet  Commonly known as:  PROTONIX  Take 40 mg by mouth daily as needed (heartburn).     rivaroxaban 20 MG Tabs tablet  Commonly known as:  XARELTO  Take 1 tablet (20 mg total) by mouth every morning.     Vitamin D3 2000 UNITS capsule  Take 4,000 Units by mouth daily.       Duration of Discharge Encounter: Greater than 30 minutes including physician time.  Darrick Huntsman, PA-C 12/11/2013, 12:12 PM   [name] MD

## 2013-12-11 NOTE — Progress Notes (Signed)
DC orders received.  Patient stable with no S/S of distress.  Discharge information and medication reviewed with patient and patient's husband.  Patient DC home. [place]

## 2013-12-11 NOTE — Discharge Instructions (Signed)
**   DO NOT start any new medications (over-the-counter or prescription) without notifying Dr. Rayann Heman first. **

## 2013-12-11 NOTE — Progress Notes (Signed)
   SUBJECTIVE: The patient is doing well today.  At this time, she denies shortness of breath or palpitations.  Now in sinus rhythm with periods of sinus with blocked PAC's.  Some chest heaviness last night with bradycardia.    CURRENT MEDICATIONS: . cyclobenzaprine  10 mg Oral QHS  . dofetilide  500 mcg Oral BID  . metoprolol succinate  50 mg Oral BID WC  . rivaroxaban  20 mg Oral Q breakfast  . sodium chloride  3 mL Intravenous Q12H  . sodium chloride  3 mL Intravenous Q12H   . sodium chloride      OBJECTIVE: Physical Exam: Filed Vitals:   12/09/13 2100 12/10/13 0500 12/10/13 1945 12/11/13 0505  BP: 111/64 113/65 120/71 129/68  Pulse: 62 53 69 44  Temp: 98 F (36.7 C) 97.9 F (36.6 C) 98.5 F (36.9 C) 97.3 F (36.3 C)  TempSrc:    Oral  Resp: 16 16 18 18   Height:      Weight:      SpO2: 100% 99% 100% 100%    Intake/Output Summary (Last 24 hours) at 12/11/13 4174 Last data filed at 12/10/13 2130  Gross per 24 hour  Intake      3 ml  Output      0 ml  Net      3 ml    Telemetry reveals sinus rhythm with periods of sinus with blocked PAC's  GEN- The patient is well appearing, alert and oriented x 3 today.  Ambulatory in the halls Head- normocephalic, atraumatic Eyes-  Sclera clear, conjunctiva pink Ears- hearing intact Oropharynx- clear Neck- supple, no JVP Lymph- no cervical lymphadenopathy Lungs- Clear to ausculation bilaterally, normal work of breathing Heart- regular with some ectopy GI- soft, NT, ND, + BS Extremities- no clubbing, cyanosis, or edema Neuro- strength and sensation are intact  LABS: Basic Metabolic Panel:  Recent Labs  12/10/13 0945 12/11/13 0430  NA 139 139  K 4.0 4.0  CL 101 102  CO2 27 23  GLUCOSE 85 93  BUN 18 17  CREATININE 0.76 0.71  CALCIUM 8.8 8.9  MG 2.1 2.1     ASSESSMENT AND PLAN:  Active Problems:   Atrial fibrillation  1. Persistent afib Doing well with tikosyn, qtc is stable Continue xarelto Stop  diltiazem and decrease metoprolol to 25mg  BID at discharge  2. Recent DVT Place SCDs  3. Obesity Weight loss   Hopefully home today if 10am ekg is ok

## 2013-12-11 NOTE — Progress Notes (Signed)
UR Completed Carliyah Cotterman Graves-Bigelow, RN,BSN [phone]  

## 2013-12-11 NOTE — Care Management Note (Addendum)
    Page 1 of 1   12/11/2013     12:05:39 PM CARE MANAGEMENT NOTE 12/11/2013  Patient:  Destiny Dennis, Destiny Dennis   Account Number:  [id number]  Date Initiated:  12/11/2013  Documentation initiated by:  GRAVES-BIGELOW,Reah Justo  Subjective/Objective Assessment:   Pt admitted for Tikosyn Load. Plan for d/c today. Pt uses [facility] on [address].     Action/Plan:   CM did call and the Pharmacy and they can order tikosyn. Benefits check in process and will make pt aware once completed. MD pt will need Rx for 7 day supply no refills and the original Rx with refills. Thanks   Anticipated DC Date:  12/11/2013   Anticipated DC Plan:  [place]  CM consult  Medication Assistance      Choice offered to / List presented to:             Status of service:  Completed, signed off Medicare Important Message given?   (If response is "NO", the following Medicare IM given date fields will be blank) Date Medicare IM given:   Date Additional Medicare IM given:    Discharge Disposition:  HOME/SELF CARE  Per UR Regulation:  Reviewed for med. necessity/level of care/duration of stay  If discussed at [place] of Stay Meetings, dates discussed:    Comments:  Pt's co pay tikosyn 29.87. Pt has 10.00 co pay card.

## 2013-12-14 ENCOUNTER — Telehealth: Payer: Self-pay | Admitting: Pharmacist

## 2013-12-14 NOTE — Telephone Encounter (Signed)
New MEssage:  Pt states she has questions about her tykosin and would like to speak with [name].

## 2013-12-14 NOTE — Telephone Encounter (Signed)
Patient called to let us know that she is feeling her heart "skip beats" and her heart rate got up to 130 last night.  She was discharged home on Toprol XL 25 mg bid and also has lopressor 25 mg at home to take when needed.  She states she took 50 mg of lopressor last night and it did feel better.  This morning she tells me her HR was 110, and she went back to taking her Toprol XL 100 mg this morning.  These palpitations seem to be occurring throughout the day.  She is not at home and therefore can't check her HR now.  She also tells me she was having these "skipping beats" in the hospital after being on Tikosyn, so feels they may be PACs.  She is concerned about her heart rate, and wants to know if she should go back up on her metoprolol succinate dose to a higher dose than 25 mg bid.  She is due to get an EKG in 1 week.  Do you want one sooner?  Please advise.  Thanks.

## 2013-12-15 NOTE — Telephone Encounter (Signed)
Patient called back to let us know that around 11:00 am yesterday morning her "palpitations" stopped and she has felt that she was in normal rhythm ever since.  She did increase metoprolol to 100 mg bid on her own and has been taking this now for past 36 hours.  Her heart rate is mid 70's, and she feels great.  She wanted to let Dr. Rayann Heman know, and will continue to take metoprolol succinate 100 mg bid unless she hears otherwise from him.  She has not required any more doses of lopressor over past 24 hours.

## 2013-12-15 NOTE — Telephone Encounter (Signed)
Follow up     For Destiny Dennis Follow up per conversation from yesterday.  Please call.

## 2013-12-19 ENCOUNTER — Ambulatory Visit: Payer: Managed Care, Other (non HMO) | Admitting: Pharmacist

## 2013-12-19 ENCOUNTER — Encounter: Payer: Self-pay | Admitting: Pharmacist

## 2013-12-19 ENCOUNTER — Ambulatory Visit (INDEPENDENT_AMBULATORY_CARE_PROVIDER_SITE_OTHER): Payer: Managed Care, Other (non HMO) | Admitting: Pharmacist Clinician (PhC)/ Clinical Pharmacy Specialist

## 2013-12-19 VITALS — BP 122/74 | HR 66 | Ht 71.0 in | Wt 248.8 lb

## 2013-12-19 DIAGNOSIS — I4891 Unspecified atrial fibrillation: Secondary | ICD-10-CM

## 2013-12-19 LAB — BASIC METABOLIC PANEL
BUN: 13 mg/dL (ref 6–23)
CO2: 29 mEq/L (ref 19–32)
Calcium: 9.1 mg/dL (ref 8.4–10.5)
Chloride: 104 mEq/L (ref 96–112)
Creatinine, Ser: 0.7 mg/dL (ref 0.4–1.2)
GFR: 88.04 mL/min (ref >60.00)
Glucose, Bld: 91 mg/dL (ref 70–99)
POTASSIUM: 3.7 meq/L (ref 3.5–5.1)
SODIUM: 140 meq/L (ref 135–145)

## 2013-12-19 LAB — MAGNESIUM: MAGNESIUM: 2 mg/dL (ref 1.5–2.5)

## 2013-12-19 MED ORDER — POTASSIUM CHLORIDE ER 10 MEQ PO TBCR: 10.0000 meq | EXTENDED_RELEASE_TABLET | Freq: Every day | ORAL | Status: DC

## 2013-12-19 MED ORDER — METOPROLOL SUCCINATE ER 100 MG PO TB24: 100.0000 mg | ORAL_TABLET | Freq: Two times a day (BID) | ORAL | Status: DC

## 2013-12-19 NOTE — Progress Notes (Signed)
12/19/2013 Destiny Dennis February 27, 1961 [id number]   HPI:  Destiny Dennis is a 53 y.o. female patient of Dr Rayann Heman, with a PMH below who presents today for a Tikosyn follow up.  She was admitted to [facility] 24-27 for Tikosyn load at 500 mcg twice daily.  She had previously failed flecainide and Multaq, and had 2 previous ablation procedures.  Latest atrial fibrillation was found after hip replacement surgery in March of this year.  She is currently anticoagulated with Xarelto.    Today she is feeling much better.  She increased her metoprolol succinate back to 100mg  bid, which she had been on at hospital admit.  She also takes an occasional metoprolol tartrate 25mg  for palpitations.  She has done this 2-3 times since discharge.  She states the palpitations do not feel like atrial fibrillation, but do occur in the early morning or evening, shortly before her next dose of Tikosyn is due.    Her Tikosyn copay is $10 on her insurance and she had no problems getting medication from [facility].  She asked the pharmacist about it and was told they have multiple patients on the medication and therefore is usually in stock.   Current Outpatient Prescriptions  Medication Sig Dispense Refill  . Cholecalciferol (VITAMIN D3) 2000 UNITS capsule Take 4,000 Units by mouth daily.      . cyclobenzaprine (FLEXERIL) 10 MG tablet Take 10 mg by mouth daily.       . diazepam (VALIUM) 5 MG tablet Take 5 mg by mouth 2 (two) times daily as needed for muscle spasms.       Marland Kitchen dofetilide (TIKOSYN) 500 MCG capsule Take 1 capsule (500 mcg total) by mouth every 12 (twelve) hours.  60 capsule  3  . fish oil-omega-3 fatty acids 1000 MG capsule Take 2 g by mouth daily.      . methocarbamol (ROBAXIN) 500 MG tablet Take 1 tablet (500 mg total) by mouth every 6 (six) hours as needed for muscle spasms.  50 tablet  0  . metoprolol succinate (TOPROL-XL) 100 MG 24 hr tablet Take 1 tablet (100 mg total) by mouth 2 (two)  times daily. Take with or immediately following a meal.  60 tablet  3  . metoprolol tartrate (LOPRESSOR) 25 MG tablet Take 25-50 mg by mouth every 6 (six) hours as needed (A-FIB).      . Multiple Vitamin (MULITIVITAMIN WITH MINERALS) TABS Take 1 tablet by mouth daily.      Marland Kitchen oxyCODONE (OXY IR/ROXICODONE) 5 MG immediate release tablet Take 1-3 tablets (5-15 mg total) by mouth every 4 (four) hours as needed for severe pain.  100 tablet  0  . pantoprazole (PROTONIX) 40 MG tablet Take 40 mg by mouth daily as needed (heartburn).      . Rivaroxaban (XARELTO) 20 MG TABS tablet Take 1 tablet (20 mg total) by mouth every morning.  30 tablet    . [DISCONTINUED] flecainide (TAMBOCOR) 100 MG tablet Take 1 tablet (100 mg total) by mouth 2 (two) times daily.  60 tablet  12   No current facility-administered medications for this visit.    Allergies  Allergen Reactions  . Avelox [Moxifloxacin Hcl In Nacl] Other (See Comments)    Numbness & tingling Peripheral neuropathy    Past Medical History  Diagnosis Date  . Persistent atrial fibrillation 03/05/2007    a. s/p RFCA 12/18/2011, 07/29/12  . 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 Sialoadenitis 03/18/2010  .  PVC (premature ventricular contraction)   . History of MRSA infection 2008    superficial skin-cleared easily with doxycycline  . Allergic rhinitis   . Cervical disc disease   . Benign fundic gland polyps of stomach   . Diverticulitis     CT Scan   . Hemorrhoids   . Atrial flutter     a. s/p RFCA 12/18/2011  . Arthritis   . Anxiety     when tachycardia occurs  . GERD 12/23/2009    diet related-not a problem  . Hiatal hernia     small  . Headache(784.0)     migraines occ.  Marland Kitchen DVT (deep vein thrombosis) in pregnancy     s/p hip surgery    Blood pressure 122/74, pulse 66, height 5\' 11"  (1.803 m), weight 248 lb 12.8 oz (112.855 kg), last menstrual period 04/17/2012.   ASSESSMENT AND PLAN:  Destiny Dennis is feeling well today.  EKG showed  NSR per Dr. Caryl Comes, with a QTc of 454 ms.  Potassium is low today at 3.7 and magnesium is fine at 2.0.  We will start her on potassium supplementation 45mEq daily.  Pt is to continue with Tikosyn 576mcg twice daily.  She was advised to always call for refill at least 1 week in advance and to call us if the pharmacy is unable to fill the prescription.  She is scheduled to see Dr. Rayann Heman on May 27.  Tommy Medal PharmD CPP [facility]

## 2013-12-29 ENCOUNTER — Telehealth: Payer: Self-pay | Admitting: Family

## 2013-12-29 NOTE — Telephone Encounter (Signed)
Patient Information:  Caller Name: Athea  Phone: [phone]  Patient: Destiny Dennis, Destiny Dennis  Gender: Female  DOB: 1960-11-01  Age: 53 Years  PCP: Phoebe Sharps (Adults only)  Pregnant: No  Office Follow Up:  Does the office need to follow up with this patient?: Yes  Instructions For The Office: No appts. available at [employer] or [employer].  Patient declines an appt. at any other office location. Please return call to patient at [phone] regarding possible work in appt.  Disposition obtained of "Go to office now." Above information sent to triage pool, via [employer], with High Priority.  RN Note:  Post Menopausal. Patient states she is status post Right Hip Replacement 10/17/13. Patient states she developed a raised, red area between her neck and shoulder, onset 12/26/13. Patient states area is red and approx. the size of a pencil eraser. States area is tender to touch. Patient states a pustule is noted in the center. Patient states she expressed a moderate amount of brownish/white purulent drainage 12/29/13. Afebrile. Care advice given per guidelines. Patient advised to apply warm compresses to the area. Call back parameters reviewed. Patient verbalizes understanding. No appts. available at [employer] or [employer].  Patient declines an appt. at any other office location. Please return call to patient at [phone] regarding possible work in appt.  Disposition obtained of "Go to office now." Above information sent to triage pool, via [employer], with High Priority.   Symptoms  Reason For Call & Symptoms: Abscess  Reviewed Health History In EMR: Yes  Reviewed Medications In EMR: Yes  Reviewed Allergies In EMR: Yes  Reviewed Surgeries / Procedures: Yes  Date of Onset of Symptoms: 12/26/2013  Treatments Tried: Warm compresses, Neosporin  Treatments Tried Worked: No OB / GYN:  LMP: Unknown  Guideline(s) Used:  Rash or  Redness - Localized  Disposition Per Guideline:   Go to Office Now  Reason For Disposition Reached:   Looks like a boil, infected sore, deep ulcer, or other infected rash (spreading redness, pus)  Advice Given:  Avoid Soap:  Wash the area once thoroughly with soap to remove any remaining irritants. Thereafter avoid soaps to this area. Cleanse the area when needed with warm water.  Call Back If:  You become worse.  Call Back If:  Redness occurs  Fever occurs  You become worse.  Patient Will Follow Care Advice:  YES

## 2013-12-29 NOTE — Telephone Encounter (Signed)
Left message on personally identified voicemail to advise pt is she is extremely concerned and feel like she needs to be seen today, then she should visit an Urgent Care or ER, otherwise she can call the office to schedule an appt for Monday 01/01/14. Also advised pt that PCP has no available appt for Monday as of right now, however she should be able to get in with someone on Monday. Advised to call back is she needs to

## 2013-12-29 NOTE — Telephone Encounter (Signed)
Message per CSR: Pt is concerned about possible MRSA after a hip replacement. Message left to call back.

## 2014-01-10 ENCOUNTER — Encounter: Payer: Managed Care, Other (non HMO) | Admitting: Internal Medicine

## 2014-01-10 ENCOUNTER — Telehealth: Payer: Self-pay | Admitting: Internal Medicine

## 2014-01-10 NOTE — Telephone Encounter (Signed)
New message     Pt is on tikosyn and another doctor prescribed tessalon pearls.  Can she take both?

## 2014-01-10 NOTE — Telephone Encounter (Signed)
Patient notified that tessalon could be taken along with her Tikosyn.

## 2014-01-17 ENCOUNTER — Ambulatory Visit (INDEPENDENT_AMBULATORY_CARE_PROVIDER_SITE_OTHER): Payer: Managed Care, Other (non HMO) | Admitting: Internal Medicine

## 2014-01-17 ENCOUNTER — Encounter: Payer: Self-pay | Admitting: Internal Medicine

## 2014-01-17 VITALS — BP 127/77 | HR 68 | Ht 71.0 in | Wt 227.0 lb

## 2014-01-17 DIAGNOSIS — I4891 Unspecified atrial fibrillation: Secondary | ICD-10-CM

## 2014-01-17 MED ORDER — METOPROLOL SUCCINATE ER 100 MG PO TB24: ORAL_TABLET | ORAL | Status: DC

## 2014-01-17 NOTE — Patient Instructions (Signed)
Your physician recommends that you schedule a follow-up appointment in: 3 months with Dr Rayann Heman  Your physician has recommended you make the following change in your medication:  1) decrease Toprol to 100mg  in am and 50 (1/2 tablet) in the pm

## 2014-01-17 NOTE — Progress Notes (Signed)
PCP: CAMPBELL, PADONDA BOYD, FNP Primary Cardiologist:  Dr Clide Dales is a 53 y.o. female who presents today for routine electrophysiology followup.  She is doing very well with tikosyn.  She is maintaining sinus rhythm without difficulty. She is tolerating tikosyn well and pleased with her current health state.  She has nonproductive cough and recent URI symptoms.  Today, she denies symptoms of chest pain, shortness of breath,  lower extremity edema, dizziness, presyncope, or syncope.  The patient is otherwise without complaint today.   Past Medical History  Diagnosis Date  . Persistent atrial fibrillation 03/05/2007    a. s/p RFCA 12/18/2011, 07/29/12  . 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 Sialoadenitis 03/18/2010  . PVC (premature ventricular contraction)   . History of MRSA infection 2008    superficial skin-cleared easily with doxycycline  . Allergic rhinitis   . Cervical disc disease   . Benign fundic gland polyps of stomach   . Diverticulitis     CT Scan   . Hemorrhoids   . Atrial flutter     a. s/p RFCA 12/18/2011  . Arthritis   . Anxiety     when tachycardia occurs  . GERD 12/23/2009    diet related-not a problem  . Hiatal hernia     small  . Headache(784.0)     migraines occ.  Marland Kitchen DVT (deep vein thrombosis) in pregnancy     s/p hip surgery   Past Surgical History  Procedure Laterality Date  . Knee surgery      x 9 Left Knee  . Upper gastrointestinal endoscopy  01/30/2010    hiatal hernia, fundic gland polyps  . Shoulder surgery      Right   . Elbow surgery      Right  . Foot surgery      Right   . Cervical fusion    . Cesarean section      x 2  . Wrist surgery      Left   . Cholecystectomy    . Flexible sigmoidoscopy      1990's  . Colonoscopy  07/16/2011    diverticulosis  . Tee without cardioversion  12/17/2011    Procedure: TRANSESOPHAGEAL ECHOCARDIOGRAM (TEE);  Surgeon: Larey Dresser, MD;  Location: [facility] ENDOSCOPY;  Service:  Cardiovascular;  Laterality: N/A;  . Atrial fibrillation and atrial flutter ablation  12/17/11, 07/29/12    PVI and CTI ablations by Dr Rayann Heman  . Tee without cardioversion  07/28/2012    Procedure: TRANSESOPHAGEAL ECHOCARDIOGRAM (TEE);  Surgeon: Thayer Headings, MD;  Location: [facility] ENDOSCOPY;  Service: Cardiovascular;  Laterality: N/A;  . Atrial fibrillation ablation  07/29/2012    PVI by Dr Rayann Heman (second procedure)  . Total knee arthroplasty Right 12/12/2012    Procedure: RIGHT TOTAL KNEE ARTHROPLASTY;  Surgeon: Mauri Pole, MD;  Location: [facility] ORS;  Service: Orthopedics;  Laterality: Right;  . I&d knee with poly exchange Left 12/12/2012    Procedure: LEFT KNEE EXCISION SAPHENOUS NEUROMA/OPEN SCAR DEBRIDEMENT/POLY EXCHANGE/NERVE EXCISION;  Surgeon: Mauri Pole, MD;  Location: [facility] ORS;  Service: Orthopedics;  Laterality: Left;  . Joint replacement Bilateral     4'14  . Total hip arthroplasty Left 10/17/2013    Procedure: LEFT TOTAL HIP ARTHROPLASTY ANTERIOR APPROACH;  Surgeon: Mauri Pole, MD;  Location: [facility] ORS;  Service: Orthopedics;  Laterality: Left;    ROS_ all systems reviewed and negative except as per above  Current Outpatient Prescriptions  Medication Sig  Dispense Refill  . cyclobenzaprine (FLEXERIL) 10 MG tablet Take 10 mg by mouth daily.       . diazepam (VALIUM) 5 MG tablet Take 5 mg by mouth 2 (two) times daily as needed for muscle spasms.       Marland Kitchen dofetilide (TIKOSYN) 500 MCG capsule Take 1 capsule (500 mcg total) by mouth every 12 (twelve) hours.  60 capsule  3  . metoprolol succinate (TOPROL-XL) 100 MG 24 hr tablet Take 1 tablet (100 mg total) by mouth 2 (two) times daily. Take with or immediately following a meal.  60 tablet  3  . metoprolol tartrate (LOPRESSOR) 25 MG tablet Take 25-50 mg by mouth every 6 (six) hours as needed (A-FIB).      Marland Kitchen oxyCODONE (OXY IR/ROXICODONE) 5 MG immediate release tablet Take 1-3 tablets (5-15 mg total) by mouth every 4 (four) hours as needed for  severe pain.  100 tablet  0  . pantoprazole (PROTONIX) 40 MG tablet Take 40 mg by mouth daily as needed (heartburn).      . Rivaroxaban (XARELTO) 20 MG TABS tablet Take 1 tablet (20 mg total) by mouth every morning.  30 tablet    . Cholecalciferol (VITAMIN D3) 2000 UNITS capsule Take 4,000 Units by mouth daily.      . fish oil-omega-3 fatty acids 1000 MG capsule Take 2 g by mouth daily.      . Multiple Vitamin (MULITIVITAMIN WITH MINERALS) TABS Take 1 tablet by mouth daily.      . potassium chloride (K-DUR) 10 MEQ tablet Take 1 tablet (10 mEq total) by mouth daily.  30 tablet  3  . [DISCONTINUED] flecainide (TAMBOCOR) 100 MG tablet Take 1 tablet (100 mg total) by mouth 2 (two) times daily.  60 tablet  12   No current facility-administered medications for this visit.    Physical Exam: Filed Vitals:   01/17/14 1433  BP: 127/77  Pulse: 68  Height: 5\' 11"  (1.803 m)  Weight: 227 lb (102.967 kg)    GEN- The patient is well appearing, alert and oriented x 3 today.   Head- normocephalic, atraumatic Eyes-  Sclera clear, conjunctiva pink Ears- hearing intact Oropharynx- clear Lungs- Clear to ausculation bilaterally, normal work of breathing Heart- irregular rate and rhythm, no murmurs, rubs or gallops, PMI not laterally displaced GI- soft, NT, ND, + BS Extremities- no clubbing, cyanosis, or edema  ekg today reveals sinus rhytm 68 bpm, Qtc 427  Assessment and Plan:  1. Afib Continue xarelto and tikosyn Decrease metoprolol to 100mg  qam and 50mg  qpm.  Consider decreasing further to 100mg  daily upon return  2. DVT Continue xarelto  3 months

## 2014-01-25 ENCOUNTER — Other Ambulatory Visit: Payer: Self-pay | Admitting: Dermatology

## 2014-01-29 ENCOUNTER — Other Ambulatory Visit: Payer: Self-pay | Admitting: Internal Medicine

## 2014-02-15 ENCOUNTER — Encounter: Payer: Self-pay | Admitting: Family Medicine

## 2014-02-15 ENCOUNTER — Ambulatory Visit (INDEPENDENT_AMBULATORY_CARE_PROVIDER_SITE_OTHER): Payer: Managed Care, Other (non HMO) | Admitting: Family Medicine

## 2014-02-15 VITALS — BP 122/82 | HR 73 | Temp 97.8°F | Ht 71.0 in

## 2014-02-15 DIAGNOSIS — J0100 Acute maxillary sinusitis, unspecified: Secondary | ICD-10-CM

## 2014-02-15 MED ORDER — AMOXICILLIN-POT CLAVULANATE 875-125 MG PO TABS: 1.0000 | ORAL_TABLET | Freq: Two times a day (BID) | ORAL | Status: DC

## 2014-02-15 NOTE — Progress Notes (Signed)
Pre visit review using our clinic review tool, if applicable. No additional management support is needed unless otherwise documented below in the visit note. 

## 2014-02-15 NOTE — Patient Instructions (Signed)
-  As we discussed, we have prescribed a new medication for you at this appointment. We discussed the common and serious potential adverse effects of this medication and you can review these and more with the pharmacist when you pick up your medication.  Please follow the instructions for use carefully and notify us immediately if you have any problems taking this medication.  -follow up with your doctor if worsening or not improving

## 2014-02-15 NOTE — Progress Notes (Signed)
No chief complaint on file.   HPI:  Acute visit for:  Ear Pain: -whole family had cold -she got it too -congestion, PND, ears stopped up for several weeks, now has developed sinus pain on L side and L ear pain and L max tooth pain the last few days -denies: fevers >101, SOB, NVD -no tick bite, recent travel -has allergies and taking clartin  ROS: See pertinent positives and negatives per HPI.  Past Medical History  Diagnosis Date  . Persistent atrial fibrillation 03/05/2007    a. s/p RFCA 12/18/2011, 07/29/12  . 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 Sialoadenitis 03/18/2010  . PVC (premature ventricular contraction)   . History of MRSA infection 2008    superficial skin-cleared easily with doxycycline  . Allergic rhinitis   . Cervical disc disease   . Benign fundic gland polyps of stomach   . Diverticulitis     CT Scan   . Hemorrhoids   . Atrial flutter     a. s/p RFCA 12/18/2011  . Arthritis   . Anxiety     when tachycardia occurs  . GERD 12/23/2009    diet related-not a problem  . Hiatal hernia     small  . Headache(784.0)     migraines occ.  Marland Kitchen DVT (deep vein thrombosis) in pregnancy     s/p hip surgery    Past Surgical History  Procedure Laterality Date  . Knee surgery      x 9 Left Knee  . Upper gastrointestinal endoscopy  01/30/2010    hiatal hernia, fundic gland polyps  . Shoulder surgery      Right   . Elbow surgery      Right  . Foot surgery      Right   . Cervical fusion    . Cesarean section      x 2  . Wrist surgery      Left   . Cholecystectomy    . Flexible sigmoidoscopy      1990's  . Colonoscopy  07/16/2011    diverticulosis  . Tee without cardioversion  12/17/2011    Procedure: TRANSESOPHAGEAL ECHOCARDIOGRAM (TEE);  Surgeon: Larey Dresser, MD;  Location: [facility] ENDOSCOPY;  Service: Cardiovascular;  Laterality: N/A;  . Atrial fibrillation and atrial flutter ablation  12/17/11, 07/29/12    PVI and CTI ablations by Dr Rayann Heman  . Tee without  cardioversion  07/28/2012    Procedure: TRANSESOPHAGEAL ECHOCARDIOGRAM (TEE);  Surgeon: Thayer Headings, MD;  Location: [facility] ENDOSCOPY;  Service: Cardiovascular;  Laterality: N/A;  . Atrial fibrillation ablation  07/29/2012    PVI by Dr Rayann Heman (second procedure)  . Total knee arthroplasty Right 12/12/2012    Procedure: RIGHT TOTAL KNEE ARTHROPLASTY;  Surgeon: Mauri Pole, MD;  Location: [facility] ORS;  Service: Orthopedics;  Laterality: Right;  . I&d knee with poly exchange Left 12/12/2012    Procedure: LEFT KNEE EXCISION SAPHENOUS NEUROMA/OPEN SCAR DEBRIDEMENT/POLY EXCHANGE/NERVE EXCISION;  Surgeon: Mauri Pole, MD;  Location: [facility] ORS;  Service: Orthopedics;  Laterality: Left;  . Joint replacement Bilateral     4'14  . Total hip arthroplasty Left 10/17/2013    Procedure: LEFT TOTAL HIP ARTHROPLASTY ANTERIOR APPROACH;  Surgeon: Mauri Pole, MD;  Location: [facility] ORS;  Service: Orthopedics;  Laterality: Left;    Family History  Problem Relation Age of Onset  . Diabetes Maternal Grandfather   . Colon cancer Neg Hx   . Diverticulitis Brother     x 2  .  Diverticulitis Paternal Grandmother     History   Social History  . Marital Status: Married    Spouse Name: N/A    Number of Children: 2  . Years of Education: N/A   Occupational History  . [employer]     Workers Comp   Social History Main Topics  . Smoking status: Never Smoker   . Smokeless tobacco: Never Used  . Alcohol Use: Yes     Comment: rare social  . Drug Use: No  . Sexual Activity: Yes     Comment: has periods q 3-4 months- no possiblity pregnant per pt.07-16-11   Other Topics Concern  . None   Social History Narrative   Daily Caffeine    Pt lives in [place] with spouse.  She works as a workers Dance movement psychotherapist.    Current outpatient prescriptions:cyclobenzaprine (FLEXERIL) 10 MG tablet, Take 10 mg by mouth daily. , Disp: , Rfl: ;  diazepam (VALIUM) 5 MG tablet, Take 5 mg by mouth 2 (two) times daily as needed  for muscle spasms. , Disp: , Rfl: ;  dofetilide (TIKOSYN) 500 MCG capsule, Take 1 capsule (500 mcg total) by mouth every 12 (twelve) hours., Disp: 60 capsule, Rfl: 3 metoprolol succinate (TOPROL-XL) 100 MG 24 hr tablet, Take with or immediately following a meal.--100mg  in am and 50mg  in pm, Disp: 60 tablet, Rfl: 3;  metoprolol tartrate (LOPRESSOR) 25 MG tablet, Take 25-50 mg by mouth every 6 (six) hours as needed (A-FIB)., Disp: , Rfl: ;  oxyCODONE (OXY IR/ROXICODONE) 5 MG immediate release tablet, Take 1-3 tablets (5-15 mg total) by mouth every 4 (four) hours as needed for severe pain., Disp: 100 tablet, Rfl: 0 pantoprazole (PROTONIX) 40 MG tablet, Take 40 mg by mouth daily as needed (heartburn)., Disp: , Rfl: ;  potassium chloride (K-DUR) 10 MEQ tablet, Take 1 tablet (10 mEq total) by mouth daily., Disp: 30 tablet, Rfl: 3;  Rivaroxaban (XARELTO) 20 MG TABS tablet, Take 1 tablet (20 mg total) by mouth every morning., Disp: 30 tablet, Rfl: ;  XARELTO 20 MG TABS tablet, TAKE 1 TABLET (20 MG TOTAL) BY MOUTH AT BEDTIME., Disp: 30 tablet, Rfl: 3 amoxicillin-clavulanate (AUGMENTIN) 875-125 MG per tablet, Take 1 tablet by mouth 2 (two) times daily., Disp: 20 tablet, Rfl: 0;  [DISCONTINUED] flecainide (TAMBOCOR) 100 MG tablet, Take 1 tablet (100 mg total) by mouth 2 (two) times daily., Disp: 60 tablet, Rfl: 12  EXAM:  Filed Vitals:   02/15/14 1348  BP: 122/82  Pulse: 73  Temp: 97.8 F (36.6 C)    Body mass index is 0.00 kg/(m^2).  GENERAL: vitals reviewed and listed above, alert, oriented, appears well hydrated and in no acute distress  HEENT: atraumatic, conjunttiva clear, no obvious abnormalities on inspection of external nose and ears, normal appearance of ear canals and TMs, clear nasal congestion, mild post oropharyngeal erythema with PND, no tonsillar edema or exudate, no sinus TTP  NECK: no obvious masses on inspection  LUNGS: clear to auscultation bilaterally, no wheezes, rales or rhonchi,  good air movement  CV: HRRR, no peripheral edema  MS: moves all extremities without noticeable abnormality  PSYCH: pleasant and cooperative, no obvious depression or anxiety  ASSESSMENT AND PLAN:  Discussed the following assessment and plan:  Acute maxillary sinusitis, recurrence not specified - Plan: amoxicillin-clavulanate (AUGMENTIN) 875-125 MG per tablet  -Patient advised to return or notify a doctor immediately if symptoms worsen or persist or new concerns arise.  Patient Instructions  -As we  discussed, we have prescribed a new medication for you at this appointment. We discussed the common and serious potential adverse effects of this medication and you can review these and more with the pharmacist when you pick up your medication.  Please follow the instructions for use carefully and notify us immediately if you have any problems taking this medication.  -follow up with your doctor if worsening or not improving     Destiny Dennis R.

## 2014-03-16 ENCOUNTER — Ambulatory Visit (INDEPENDENT_AMBULATORY_CARE_PROVIDER_SITE_OTHER): Payer: Managed Care, Other (non HMO) | Admitting: Pharmacist

## 2014-03-16 ENCOUNTER — Telehealth: Payer: Self-pay | Admitting: Internal Medicine

## 2014-03-16 DIAGNOSIS — I4891 Unspecified atrial fibrillation: Secondary | ICD-10-CM

## 2014-03-16 LAB — BASIC METABOLIC PANEL
BUN: 15 mg/dL (ref 6–23)
CHLORIDE: 104 meq/L (ref 96–112)
CO2: 28 meq/L (ref 19–32)
CREATININE: 0.8 mg/dL (ref 0.4–1.2)
Calcium: 9.3 mg/dL (ref 8.4–10.5)
GFR: 78.13 mL/min (ref >60.00)
Glucose, Bld: 132 mg/dL — ABNORMAL HIGH (ref 70–99)
Potassium: 3.8 mEq/L (ref 3.5–5.1)
Sodium: 139 mEq/L (ref 135–145)

## 2014-03-16 LAB — CBC
HEMATOCRIT: 37.1 % (ref 36.0–46.0)
Hemoglobin: 12.3 g/dL (ref 12.0–15.0)
MCHC: 33.2 g/dL (ref 30.0–36.0)
MCV: 86.3 fl (ref 78.0–100.0)
Platelets: 306 10*3/uL (ref 150.0–400.0)
RBC: 4.3 Mil/uL (ref 3.87–5.11)
RDW: 14.8 % (ref 11.5–15.5)
WBC: 7.3 10*3/uL (ref 4.0–10.5)

## 2014-03-16 NOTE — Telephone Encounter (Signed)
Spoke with pt.  She is going to come into the office this afternoon.

## 2014-03-16 NOTE — Telephone Encounter (Signed)
Postural dizziness is unlikely due to tikosyn.  Would bring in for an EKG and consider reducing metoprolol if she is bradycardic. Would also check orthostatics, bmet, and cbc.  Encourage adequate hydration.

## 2014-03-16 NOTE — Progress Notes (Signed)
HPI  Destiny Dennis is a 53 yo pt of Dr. Rayann Heman.  She has a history of atrial fibrillation and was started on Tikosyn in April.  Since that time, she reports maintaining SR majority of the time but does have episodes of atrial fibrillation every 4-5 weeks.  These usually resolve within 1 day and she does take extra metoprolol 25mg  when this happens.  Dr. Rayann Heman did ask her to decrease her metoprolol from 100mg  BID to 100mg  in AM and PM.  She tried this for a few weeks but noticed more palpitations so increased back to 100mg  BID.  Her home HRs have been in the 60s.    She called today to report an episode last week where she became very lightheaded after standing up.  She states she feels like she went into a fib and when she stood, she thought she was going to pass out.  She took her 25mg  of metoprolol tartrate and this resolved within 30 minutes.  She had a concern that she may be having QTc prolongation related to Tikosyn.  Dr. Rayann Heman suggested she come to the office today for EKG, orthostatics, and labwork.   EKG reviewed by Dr. Caryl Comes: NSR with vent rate of 63 bpm.  Qtc- 462 msec  Orthostatics:   Starting: 133/80; HR 68 Lying: 124/69; HR 62 Sitting: 131/76; HR 63 Standing at 0 min: 128/74; HR 66 Standing at 3 min: 124/73; HR 63  Pt remained asymptomatic during entire procedure.   Current Outpatient Prescriptions  Medication Sig Dispense Refill  . amoxicillin-clavulanate (AUGMENTIN) 875-125 MG per tablet Take 1 tablet by mouth 2 (two) times daily.  20 tablet  0  . cyclobenzaprine (FLEXERIL) 10 MG tablet Take 10 mg by mouth daily.       . diazepam (VALIUM) 5 MG tablet Take 5 mg by mouth 2 (two) times daily as needed for muscle spasms.       Marland Kitchen dofetilide (TIKOSYN) 500 MCG capsule Take 1 capsule (500 mcg total) by mouth every 12 (twelve) hours.  60 capsule  3  . metoprolol succinate (TOPROL-XL) 100 MG 24 hr tablet Take with or immediately following a meal.--100mg  in am and 50mg  in pm  60 tablet   3  . metoprolol tartrate (LOPRESSOR) 25 MG tablet Take 25-50 mg by mouth every 6 (six) hours as needed (A-FIB).      Marland Kitchen oxyCODONE (OXY IR/ROXICODONE) 5 MG immediate release tablet Take 1-3 tablets (5-15 mg total) by mouth every 4 (four) hours as needed for severe pain.  100 tablet  0  . pantoprazole (PROTONIX) 40 MG tablet Take 40 mg by mouth daily as needed (heartburn).      . potassium chloride (K-DUR) 10 MEQ tablet Take 1 tablet (10 mEq total) by mouth daily.  30 tablet  3  . Rivaroxaban (XARELTO) 20 MG TABS tablet Take 1 tablet (20 mg total) by mouth every morning.  30 tablet    . XARELTO 20 MG TABS tablet TAKE 1 TABLET (20 MG TOTAL) BY MOUTH AT BEDTIME.  30 tablet  3  . [DISCONTINUED] flecainide (TAMBOCOR) 100 MG tablet Take 1 tablet (100 mg total) by mouth 2 (two) times daily.  60 tablet  12   No current facility-administered medications for this visit.

## 2014-03-16 NOTE — Assessment & Plan Note (Signed)
QTc normal.  Explained to pt episode likely not related to Tikosyn.  She is also going through menopause and has similar symptoms during a hot flash so her GYN stated it may be related to this as well.  Will review labs when available and forward to Dr. Rayann Heman for additional recommendations.  Educated pt on changing positions slowly and staying hydrated to prevent orthostatic hypotension.

## 2014-03-16 NOTE — Telephone Encounter (Signed)
New message     For Destiny Dennis or Destiny Dennis Pt is on tikosyn and has a question about taking other medicatins

## 2014-03-16 NOTE — Telephone Encounter (Signed)
Spoke with pt.  She had episode of atrial fibrillation yesterday in which she almost passed out when she stood up.  Since starting Tikosyn, she reports short episodes of atrial fibrillation every few weeks but this was the worst to date.  She was concerned that this may be a sign of QTc prolongation.  She has not missed a dose or started any medications that are associated with QTc prolongation.  She did take amoxicillin prior to dental work this week but should not have been an interaction.  She took 25mg  of metoprolol and within 30 minutes, the episode resolved.  Pt feels fine today.  Have asked her to continue to monitor and if she has severe episodes again to let us know so we can evaluate her further.

## 2014-04-10 ENCOUNTER — Other Ambulatory Visit: Payer: Self-pay | Admitting: Internal Medicine

## 2014-04-11 ENCOUNTER — Encounter (HOSPITAL_COMMUNITY): Payer: Managed Care, Other (non HMO)

## 2014-04-11 ENCOUNTER — Other Ambulatory Visit: Payer: Self-pay

## 2014-04-11 ENCOUNTER — Other Ambulatory Visit (HOSPITAL_COMMUNITY): Payer: Self-pay | Admitting: Orthopedic Surgery

## 2014-04-11 DIAGNOSIS — I82409 Acute embolism and thrombosis of unspecified deep veins of unspecified lower extremity: Secondary | ICD-10-CM

## 2014-04-11 MED ORDER — DOFETILIDE 500 MCG PO CAPS: 500.0000 ug | ORAL_CAPSULE | Freq: Two times a day (BID) | ORAL | Status: DC

## 2014-04-13 ENCOUNTER — Ambulatory Visit (HOSPITAL_COMMUNITY)
Admission: RE | Admit: 2014-04-13 | Discharge: 2014-04-13 | Disposition: A | Discharge status: Home / Self Care | Payer: Managed Care, Other (non HMO) | Source: Ambulatory Visit | Attending: Cardiology | Admitting: Cardiology

## 2014-04-13 DIAGNOSIS — I824Z9 Acute embolism and thrombosis of unspecified deep veins of unspecified distal lower extremity: Secondary | ICD-10-CM | POA: Insufficient documentation

## 2014-04-13 DIAGNOSIS — I82409 Acute embolism and thrombosis of unspecified deep veins of unspecified lower extremity: Secondary | ICD-10-CM

## 2014-04-13 DIAGNOSIS — I8010 Phlebitis and thrombophlebitis of unspecified femoral vein: Secondary | ICD-10-CM

## 2014-04-13 NOTE — Progress Notes (Signed)
Left Lower Ext. Venous Duplex Completed. Negative for DVT and SVT. There is resolution of DVT in the peroneal veins that were noted on 10/19/2013. Oda Cogan, BS, RDMS, RVT

## 2014-04-16 ENCOUNTER — Telehealth (HOSPITAL_COMMUNITY): Payer: Self-pay | Admitting: *Deleted

## 2014-04-20 ENCOUNTER — Telehealth (HOSPITAL_COMMUNITY): Payer: Self-pay | Admitting: *Deleted

## 2014-05-07 ENCOUNTER — Ambulatory Visit: Payer: Managed Care, Other (non HMO) | Admitting: Internal Medicine

## 2014-05-09 ENCOUNTER — Ambulatory Visit (INDEPENDENT_AMBULATORY_CARE_PROVIDER_SITE_OTHER): Payer: Managed Care, Other (non HMO) | Admitting: Family

## 2014-05-09 ENCOUNTER — Encounter: Payer: Self-pay | Admitting: Family

## 2014-05-09 VITALS — BP 124/80 | HR 65 | Temp 98.1°F | Wt 258.0 lb

## 2014-05-09 DIAGNOSIS — Z23 Encounter for immunization: Secondary | ICD-10-CM

## 2014-05-09 DIAGNOSIS — N390 Urinary tract infection, site not specified: Secondary | ICD-10-CM

## 2014-05-09 DIAGNOSIS — I4891 Unspecified atrial fibrillation: Secondary | ICD-10-CM

## 2014-05-09 DIAGNOSIS — R300 Dysuria: Secondary | ICD-10-CM

## 2014-05-09 DIAGNOSIS — B9689 Other specified bacterial agents as the cause of diseases classified elsewhere: Secondary | ICD-10-CM

## 2014-05-09 DIAGNOSIS — A499 Bacterial infection, unspecified: Secondary | ICD-10-CM

## 2014-05-09 DIAGNOSIS — I482 Chronic atrial fibrillation, unspecified: Secondary | ICD-10-CM

## 2014-05-09 LAB — POCT URINALYSIS DIPSTICK
BILIRUBIN UA: NEGATIVE
Glucose, UA: NEGATIVE
Ketones, UA: NEGATIVE
Leukocytes, UA: NEGATIVE
Nitrite, UA: POSITIVE
PH UA: 5.5
PROTEIN UA: NEGATIVE
SPEC GRAV UA: 1.01
Urobilinogen, UA: 0.2

## 2014-05-09 MED ORDER — NITROFURANTOIN MONOHYD MACRO 100 MG PO CAPS: 100.0000 mg | ORAL_CAPSULE | Freq: Two times a day (BID) | ORAL | Status: DC

## 2014-05-09 NOTE — Progress Notes (Signed)
Pre visit review using our clinic review tool, if applicable. No additional management support is needed unless otherwise documented below in the visit note. 

## 2014-05-09 NOTE — Progress Notes (Signed)
Subjective:    Patient ID: Destiny Dennis, female    DOB: April 30, 1961, 53 y.o.   MRN: [id number]  Urinary Tract Infection  Associated symptoms include frequency, hematuria and urgency.    53 year old white female, nonsmoker with a history of atrial fibrillation is in today with complaints of urinary frequency, urgency, burning with urination x4 days and worsening. Has been having issues with urinary leakage over the last 4 weeks and has been seen by gynecology who attributed it to menopause. Denies any fever or chills. Has been taking AZO that has helped her symptoms.  Review of Systems  Constitutional: Negative.   Respiratory: Negative.   Cardiovascular: Negative.   Gastrointestinal: Negative.   Endocrine: Negative.   Genitourinary: Positive for urgency, frequency and hematuria.  Musculoskeletal: Negative.   Skin: Negative.   Allergic/Immunologic: Negative.   Psychiatric/Behavioral: Negative.    Past Medical History  Diagnosis Date  . Persistent atrial fibrillation 03/05/2007    a. s/p RFCA 12/18/2011, 07/29/12  . 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 Sialoadenitis 03/18/2010  . PVC (premature ventricular contraction)   . History of MRSA infection 2008    superficial skin-cleared easily with doxycycline  . Allergic rhinitis   . Cervical disc disease   . Benign fundic gland polyps of stomach   . Diverticulitis     CT Scan   . Hemorrhoids   . Atrial flutter     a. s/p RFCA 12/18/2011  . Arthritis   . Anxiety     when tachycardia occurs  . GERD 12/23/2009    diet related-not a problem  . Hiatal hernia     small  . Headache(784.0)     migraines occ.  Marland Kitchen DVT (deep vein thrombosis) in pregnancy     s/p hip surgery    History   Social History  . Marital Status: Married    Spouse Name: N/A    Number of Children: 2  . Years of Education: N/A   Occupational History  . [employer]     Workers Comp   Social History Main Topics  . Smoking status: Never  Smoker   . Smokeless tobacco: Never Used  . Alcohol Use: Yes     Comment: rare social  . Drug Use: No  . Sexual Activity: Yes     Comment: has periods q 3-4 months- no possiblity pregnant per pt.07-16-11   Other Topics Concern  . Not on file   Social History Narrative   Daily Caffeine    Pt lives in [place] with spouse.  She works as a workers Dance movement psychotherapist.    Past Surgical History  Procedure Laterality Date  . Knee surgery      x 9 Left Knee  . Upper gastrointestinal endoscopy  01/30/2010    hiatal hernia, fundic gland polyps  . Shoulder surgery      Right   . Elbow surgery      Right  . Foot surgery      Right   . Cervical fusion    . Cesarean section      x 2  . Wrist surgery      Left   . Cholecystectomy    . Flexible sigmoidoscopy      1990's  . Colonoscopy  07/16/2011    diverticulosis  . Tee without cardioversion  12/17/2011    Procedure: TRANSESOPHAGEAL ECHOCARDIOGRAM (TEE);  Surgeon: Larey Dresser, MD;  Location: [place];  Service: Cardiovascular;  Laterality: N/A;  .  Atrial fibrillation and atrial flutter ablation  12/17/11, 07/29/12    PVI and CTI ablations by Dr Rayann Heman  . Tee without cardioversion  07/28/2012    Procedure: TRANSESOPHAGEAL ECHOCARDIOGRAM (TEE);  Surgeon: Thayer Headings, MD;  Location: [facility]COPY;  Service: Cardiovascular;  Laterality: N/A;  . Atrial fibrillation ablation  07/29/2012    PVI by Dr Rayann Heman (second procedure)  . Total knee arthroplasty Right 12/12/2012    Procedure: RIGHT TOTAL KNEE ARTHROPLASTY;  Surgeon: Mauri Pole, MD;  Location: [facility];  Service: Orthopedics;  Laterality: Right;  . I&d knee with poly exchange Left 12/12/2012    Procedure: LEFT KNEE EXCISION SAPHENOUS NEUROMA/OPEN SCAR DEBRIDEMENT/POLY EXCHANGE/NERVE EXCISION;  Surgeon: Mauri Pole, MD;  Location: [facility];  Service: Orthopedics;  Laterality: Left;  . Joint replacement Bilateral     4'14  . Total hip arthroplasty Left 10/17/2013     Procedure: LEFT TOTAL HIP ARTHROPLASTY ANTERIOR APPROACH;  Surgeon: Mauri Pole, MD;  Location: [facility];  Service: Orthopedics;  Laterality: Left;    Family History  Problem Relation Age of Onset  . Diabetes Maternal Grandfather   . Colon cancer Neg Hx   . Diverticulitis Brother     x 2  . Diverticulitis Paternal Grandmother     Allergies  Allergen Reactions  . Avelox [Moxifloxacin Hcl In Nacl] Other (See Comments)    Numbness & tingling Peripheral neuropathy    Current Outpatient Prescriptions on File Prior to Visit  Medication Sig Dispense Refill  . diazepam (VALIUM) 5 MG tablet Take 5 mg by mouth 2 (two) times daily as needed for muscle spasms.       Marland Kitchen dofetilide (TIKOSYN) 500 MCG capsule Take 1 capsule (500 mcg total) by mouth every 12 (twelve) hours.  60 capsule  0  . metoprolol succinate (TOPROL-XL) 100 MG 24 hr tablet Take 1 tab  100mg  in am and 50 (1/2 tablet) in the pm  60 tablet  3  . metoprolol tartrate (LOPRESSOR) 25 MG tablet Take 25-50 mg by mouth every 6 (six) hours as needed (A-FIB).      Marland Kitchen oxyCODONE (OXY IR/ROXICODONE) 5 MG immediate release tablet Take 1-3 tablets (5-15 mg total) by mouth every 4 (four) hours as needed for severe pain.  100 tablet  0  . pantoprazole (PROTONIX) 40 MG tablet Take 40 mg by mouth daily as needed (heartburn).      . potassium chloride (K-DUR) 10 MEQ tablet Take 1 tablet (10 mEq total) by mouth daily.  30 tablet  3  . Rivaroxaban (XARELTO) 20 MG TABS tablet Take 1 tablet (20 mg total) by mouth every morning.  30 tablet    . [DISCONTINUED] flecainide (TAMBOCOR) 100 MG tablet Take 1 tablet (100 mg total) by mouth 2 (two) times daily.  60 tablet  12   No current facility-administered medications on file prior to visit.    BP 124/80  Pulse 65  Temp(Src) 98.1 F (36.7 C) (Oral)  Wt 258 lb (117.028 kg)  LMP 09/01/2013chart    Objective:   Physical Exam  Constitutional: She is oriented to person, place, and time. She appears  well-developed and well-nourished.  Neck: Normal range of motion. Neck supple.  Cardiovascular: Normal rate, regular rhythm and normal heart sounds.   Pulmonary/Chest: Effort normal and breath sounds normal.  Abdominal: Soft. Bowel sounds are normal.  Musculoskeletal: Normal range of motion.  Neurological: She is alert and oriented to person, place, and time.  Skin: Skin is warm  and dry.  Psychiatric: She has a normal mood and affect.          Assessment & Plan:  Keya was seen today for urinary tract infection.  Diagnoses and associated orders for this visit:  Dysuria - POCT urinalysis dipstick  Bacterial UTI  Chronic atrial fibrillation  Other Orders - nitrofurantoin, macrocrystal-monohydrate, (MACROBID) 100 MG capsule; Take 1 capsule (100 mg total) by mouth 2 (two) times daily.    Call the office with any questions or concerns. Recheck as scheduled and as needed.

## 2014-05-09 NOTE — Addendum Note (Signed)
Addended by: Santiago Bumpers on: 05/09/2014 11:21 AM   Modules accepted: Orders

## 2014-05-09 NOTE — Patient Instructions (Signed)

## 2014-05-14 ENCOUNTER — Telehealth: Payer: Self-pay | Admitting: Internal Medicine

## 2014-05-14 NOTE — Telephone Encounter (Signed)
Pt has appt with Allred/Donna on 05-23-14 and only has 2 days left of Tikosyn, needs refill to [facility]

## 2014-05-15 ENCOUNTER — Telehealth: Payer: Self-pay | Admitting: Family

## 2014-05-15 ENCOUNTER — Other Ambulatory Visit: Payer: Self-pay | Admitting: Internal Medicine

## 2014-05-15 DIAGNOSIS — N390 Urinary tract infection, site not specified: Secondary | ICD-10-CM

## 2014-05-15 NOTE — Telephone Encounter (Signed)
Patient Information:  Caller Name: Destiny Dennis  Phone: [phone]  Patient: Destiny, Dennis  Gender: Female  DOB: Jul 26, 1961  Age: 53 Years  PCP: R[facility])  Pregnant: No  Office Follow Up:  Does the office need to follow up with this patient?: Yes  Instructions For The Office: Please advise this pt. regarding her painful urination. This is the last day of her Macrobid and pt. is still burning, although the urgency is better. Thanks.  RN Note:  Accessed EMR and did not find Urine Culture results. Pt. advised that the office would call her and let her know if they wanted to do a Follow-up UC after meds. Today is the last day of the [place]. Pt. is not in as much distress as she was last week, but still has the burning;no urgency. Please contact her.  Symptoms  Reason For Call & Symptoms: Pt. with UTI. Went into the office on 05/09/14. Diagnosed with a UTI. This is the last day of Macrobid and pt. felt better the first 3 days and now is having burning again.  Reviewed Health History In EMR: Yes  Reviewed Medications In EMR: Yes  Reviewed Allergies In EMR: Yes  Reviewed Surgeries / Procedures: Yes  Date of Onset of Symptoms: 05/09/2014 OB / GYN:  LMP: Unknown  Guideline(s) Used:  Urination Pain - Female  Disposition Per Guideline:   See Today in Office  Reason For Disposition Reached:   Taking antibiotic > 3 days for UTI and painful urination not improved  Advice Given:  Fluids:   Drink extra fluids. Drink 8-10 glasses of liquids a day (Reason: to produce a dilute, non-irritating urine).  Warm Saline SITZ Baths to Reduce Pain:  Sit in a warm saline bath for 20 minutes to cleanse the area and to reduce pain. Add 2 oz. of table salt or baking soda to a tub of water.  Call Back If:  You become worse.  Patient Will Follow Care Advice:  YES

## 2014-05-16 ENCOUNTER — Other Ambulatory Visit (INDEPENDENT_AMBULATORY_CARE_PROVIDER_SITE_OTHER): Payer: Managed Care, Other (non HMO)

## 2014-05-16 DIAGNOSIS — R300 Dysuria: Secondary | ICD-10-CM

## 2014-05-16 LAB — POCT URINALYSIS DIPSTICK
BILIRUBIN UA: NEGATIVE
Glucose, UA: NEGATIVE
KETONES UA: NEGATIVE
Leukocytes, UA: NEGATIVE
Nitrite, UA: NEGATIVE
PH UA: 5.5
Protein, UA: NEGATIVE
Spec Grav, UA: 1.015
Urobilinogen, UA: 0.2

## 2014-05-16 NOTE — Telephone Encounter (Signed)
Pt will come by the office

## 2014-05-16 NOTE — Telephone Encounter (Signed)
Stop by and drop off a urine at the lab

## 2014-05-23 ENCOUNTER — Encounter: Payer: Self-pay | Admitting: Internal Medicine

## 2014-05-23 ENCOUNTER — Ambulatory Visit (INDEPENDENT_AMBULATORY_CARE_PROVIDER_SITE_OTHER): Payer: Managed Care, Other (non HMO) | Admitting: Internal Medicine

## 2014-05-23 VITALS — BP 138/80 | HR 60 | Ht 71.0 in | Wt 263.4 lb

## 2014-05-23 DIAGNOSIS — I4891 Unspecified atrial fibrillation: Secondary | ICD-10-CM

## 2014-05-23 DIAGNOSIS — I82401 Acute embolism and thrombosis of unspecified deep veins of right lower extremity: Secondary | ICD-10-CM

## 2014-05-23 NOTE — Patient Instructions (Signed)
Your physician wants you to follow-up in: 6 months with Dr. Allred. You will receive a reminder letter in the mail two months in advance. If you don't receive a letter, please call our office to schedule the follow-up appointment.  

## 2014-05-23 NOTE — Progress Notes (Signed)
PCP: CAMPBELL, PADONDA BOYD, FNP Primary Cardiologist:  Dr Clide Dales is a 53 y.o. female who presents today for routine electrophysiology followup.  She is doing very well with tikosyn.  She is maintaining sinus rhythm without difficulty. She is tolerating tikosyn well and pleased with her current health state.  She has rare palpitations (likely pac's) for which she takes additional metoprolol.  Today, she denies symptoms of chest pain, shortness of breath,  lower extremity edema, dizziness, presyncope, or syncope.  The patient is otherwise without complaint today.   Past Medical History  Diagnosis Date  . Persistent atrial fibrillation 03/05/2007    a. s/p RFCA 12/18/2011, 07/29/12  . 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 Sialoadenitis 03/18/2010  . PVC (premature ventricular contraction)   . History of MRSA infection 2008    superficial skin-cleared easily with doxycycline  . Allergic rhinitis   . Cervical disc disease   . Benign fundic gland polyps of stomach   . Diverticulitis     CT Scan   . Hemorrhoids   . Atrial flutter     a. s/p RFCA 12/18/2011  . Arthritis   . Anxiety     when tachycardia occurs  . GERD 12/23/2009    diet related-not a problem  . Hiatal hernia     small  . Headache(784.0)     migraines occ.  Marland Kitchen DVT (deep vein thrombosis) in pregnancy     s/p hip surgery   Past Surgical History  Procedure Laterality Date  . Knee surgery      x 9 Left Knee  . Upper gastrointestinal endoscopy  01/30/2010    hiatal hernia, fundic gland polyps  . Shoulder surgery      Right   . Elbow surgery      Right  . Foot surgery      Right   . Cervical fusion    . Cesarean section      x 2  . Wrist surgery      Left   . Cholecystectomy    . Flexible sigmoidoscopy      1990's  . Colonoscopy  07/16/2011    diverticulosis  . Tee without cardioversion  12/17/2011    Procedure: TRANSESOPHAGEAL ECHOCARDIOGRAM (TEE);  Surgeon: Larey Dresser, MD;  Location: [facility]  ENDOSCOPY;  Service: Cardiovascular;  Laterality: N/A;  . Atrial fibrillation and atrial flutter ablation  12/17/11, 07/29/12    PVI and CTI ablations by Dr Rayann Heman  . Tee without cardioversion  07/28/2012    Procedure: TRANSESOPHAGEAL ECHOCARDIOGRAM (TEE);  Surgeon: Thayer Headings, MD;  Location[facility] ENDOSCOPY;  Service: Cardiovascular;  Laterality: N/A;  . Atrial fibrillation ablation  07/29/2012    PVI by Dr Rayann Heman (second procedure)  . Total knee arthroplasty Right 12/12/2012    Procedure: RIGHT TOTAL KNEE ARTHROPLASTY;  Surgeon: Mauri Pole, MD;  Location: [facility];  Service: Orthopedics;  Laterality: Right;  . I&d knee with poly exchange Left 12/12/2012    Procedure: LEFT KNEE EXCISION SAPHENOUS NEUROMA/OPEN SCAR DEBRIDEMENT/POLY EXCHANGE/NERVE EXCISION;  Surgeon: Mauri Pole, MD;  Location: [facility];  Service: Orthopedics;  Laterality: Left;  . Joint replacement Bilateral     4'14  . Total hip arthroplasty Left 10/17/2013    Procedure: LEFT TOTAL HIP ARTHROPLASTY ANTERIOR APPROACH;  Surgeon: Mauri Pole, MD;  Location: [facility];  Service: Orthopedics;  Laterality: Left;    ROS- urinary frequency, L eye itching/ redness today only,  all other systems  reviewed and negative except as per above  Current Outpatient Prescriptions  Medication Sig Dispense Refill  . diazepam (VALIUM) 5 MG tablet Take 5 mg by mouth 2 (two) times daily as needed for muscle spasms.       Marland Kitchen dofetilide (TIKOSYN) 500 MCG capsule TAKE 1 CAPSULE BY MOUTH EVERY 12 HOURS      . metoprolol succinate (TOPROL-XL) 100 MG 24 hr tablet Take 100 mg (1 tablet) in the AM and 50 mg (1/2 tablet) in the PM Pt states she has been taking 100 mg BID (05/23/14)      . metoprolol tartrate (LOPRESSOR) 25 MG tablet Take 25-50 mg by mouth every 6 (six) hours as needed (A-FIB).      Marland Kitchen oxyCODONE (OXY IR/ROXICODONE) 5 MG immediate release tablet Take 1-3 tablets (5-15 mg total) by mouth every 4 (four) hours as needed for severe pain.  100 tablet   0  . pantoprazole (PROTONIX) 40 MG tablet Take 40 mg by mouth daily as needed (heartburn).      . Rivaroxaban (XARELTO) 20 MG TABS tablet Take 1 tablet (20 mg total) by mouth every morning.  30 tablet    . [DISCONTINUED] flecainide (TAMBOCOR) 100 MG tablet Take 1 tablet (100 mg total) by mouth 2 (two) times daily.  60 tablet  12   No current facility-administered medications for this visit.    Physical Exam: Filed Vitals:   05/23/14 1521  BP: 138/80  Pulse: 60  Height: 5\' 11"  (1.803 m)  Weight: 263 lb 6.4 oz (119.477 kg)    GEN- The patient is well appearing, alert and oriented x 3 today.   Head- normocephalic, atraumatic Eyes-  Sclera clear, conjunctiva pink Ears- hearing intact Oropharynx- clear Lungs- Clear to ausculation bilaterally, normal work of breathing Heart- irregular rate and rhythm, no murmurs, rubs or gallops, PMI not laterally displaced GI- soft, NT, ND, + BS Extremities- no clubbing, cyanosis, or edema  ekg today reveals sinus rhytm 68 bpm, Qtc 427  Assessment and Plan:  1. Afib Continue xarelto and tikosyn Consider decreasing further to 100mg  daily upon return.  I did not do this today given symptomatic pacs.  2. DVT Continue xarelto long term  Return to see me in 6 months

## 2014-06-05 ENCOUNTER — Telehealth: Payer: Self-pay | Admitting: Pharmacist

## 2014-06-05 NOTE — Telephone Encounter (Signed)
New problem   Pt is on Tikosyn and is starting a new medication and need to speak to Gay Filler to make sure it is all right.

## 2014-06-05 NOTE — Telephone Encounter (Signed)
Spoke with pt.  She has started progesterone and estrogen.  Neither of these should interfere with Tikosyn.  She states she has been in and out of rhythm more in the past 5 days- especially today.  She has taken 50mg  of metoprolol with no effect.  Have asked her to monitor and if she is still out of rhythm and not feeling well tomorrow to call the office.

## 2014-06-05 NOTE — Telephone Encounter (Signed)
Follow up      Pt is on tikosyn and her gyn has put her on a hormone medication.  Her AFIB is "acting" up..  Could this be from the hormone and tikosyn mixed?

## 2014-06-13 ENCOUNTER — Encounter: Payer: Self-pay | Admitting: Family

## 2014-06-13 ENCOUNTER — Ambulatory Visit (INDEPENDENT_AMBULATORY_CARE_PROVIDER_SITE_OTHER): Payer: Managed Care, Other (non HMO) | Admitting: Family

## 2014-06-13 VITALS — BP 120/76 | HR 60 | Ht 71.0 in | Wt 265.6 lb

## 2014-06-13 DIAGNOSIS — K219 Gastro-esophageal reflux disease without esophagitis: Secondary | ICD-10-CM

## 2014-06-13 DIAGNOSIS — I482 Chronic atrial fibrillation, unspecified: Secondary | ICD-10-CM

## 2014-06-13 DIAGNOSIS — I10 Essential (primary) hypertension: Secondary | ICD-10-CM

## 2014-06-13 DIAGNOSIS — F411 Generalized anxiety disorder: Secondary | ICD-10-CM

## 2014-06-13 DIAGNOSIS — Z0000 Encounter for general adult medical examination without abnormal findings: Secondary | ICD-10-CM

## 2014-06-13 DIAGNOSIS — Z23 Encounter for immunization: Secondary | ICD-10-CM

## 2014-06-13 LAB — CBC WITH DIFFERENTIAL/PLATELET
Basophils Absolute: 0 10*3/uL (ref 0.0–0.1)
Basophils Relative: 0.4 % (ref 0.0–3.0)
EOS ABS: 0.2 10*3/uL (ref 0.0–0.7)
Eosinophils Relative: 2.1 % (ref 0.0–5.0)
HCT: 37.4 % (ref 36.0–46.0)
HEMOGLOBIN: 12.4 g/dL (ref 12.0–15.0)
LYMPHS ABS: 2 10*3/uL (ref 0.7–4.0)
Lymphocytes Relative: 23.9 % (ref 12.0–46.0)
MCHC: 33.3 g/dL (ref 30.0–36.0)
MCV: 85.3 fl (ref 78.0–100.0)
Monocytes Absolute: 0.7 10*3/uL (ref 0.1–1.0)
Monocytes Relative: 8.2 % (ref 3.0–12.0)
NEUTROS ABS: 5.4 10*3/uL (ref 1.4–7.7)
Neutrophils Relative %: 65.4 % (ref 43.0–77.0)
Platelets: 301 10*3/uL (ref 150.0–400.0)
RBC: 4.38 Mil/uL (ref 3.87–5.11)
RDW: 15 % (ref 11.5–15.5)
WBC: 8.3 10*3/uL (ref 4.0–10.5)

## 2014-06-13 LAB — TSH: TSH: 1.76 u[IU]/mL (ref 0.35–4.50)

## 2014-06-13 LAB — POCT URINALYSIS DIPSTICK
Bilirubin, UA: NEGATIVE
Glucose, UA: NEGATIVE
Ketones, UA: NEGATIVE
Leukocytes, UA: NEGATIVE
Nitrite, UA: NEGATIVE
PH UA: 5
Protein, UA: NEGATIVE
Spec Grav, UA: <=1.005
Urobilinogen, UA: 0.2

## 2014-06-13 LAB — LIPID PANEL
CHOL/HDL RATIO: 4
Cholesterol: 188 mg/dL (ref 0–200)
HDL: 52.2 mg/dL (ref >39.00)
LDL Cholesterol: 98 mg/dL (ref 0–99)
NONHDL: 135.8
Triglycerides: 191 mg/dL — ABNORMAL HIGH (ref 0.0–149.0)
VLDL: 38.2 mg/dL (ref 0.0–40.0)

## 2014-06-13 LAB — COMPREHENSIVE METABOLIC PANEL
ALK PHOS: 97 U/L (ref 39–117)
ALT: 25 U/L (ref 0–35)
AST: 20 U/L (ref 0–37)
Albumin: 3.8 g/dL (ref 3.5–5.2)
BILIRUBIN TOTAL: 0.8 mg/dL (ref 0.2–1.2)
BUN: 15 mg/dL (ref 6–23)
CO2: 25 meq/L (ref 19–32)
Calcium: 9.4 mg/dL (ref 8.4–10.5)
Chloride: 103 mEq/L (ref 96–112)
Creatinine, Ser: 0.9 mg/dL (ref 0.4–1.2)
GFR: 70.1 mL/min (ref >60.00)
Glucose, Bld: 88 mg/dL (ref 70–99)
Potassium: 4.4 mEq/L (ref 3.5–5.1)
SODIUM: 137 meq/L (ref 135–145)
TOTAL PROTEIN: 7.4 g/dL (ref 6.0–8.3)

## 2014-06-13 MED ORDER — BUPROPION HCL ER (XL) 150 MG PO TB24: 150.0000 mg | ORAL_TABLET | Freq: Every day | ORAL | Status: DC

## 2014-06-13 NOTE — Progress Notes (Signed)
Subjective:    Patient ID: Destiny Dennis, female    DOB: 21-Oct-1960, 53 y.o.   MRN: [id number]  HPI 53 year old white female, nonsmoker is in today for complete physical exam. Has concerns of anxiety. Is currently seeing gynecology gynecologist thinks that her overactive bladder strongly late to anxiety. She reports any time she is anxious to first thing she needs to do is to urinate. Will like to consider long-term antianxiety medication. Reports increased stress related to her chronic health conditions. She has a history of atrial fibrillation, obesity, bilateral knee replacements, weight gain, and Hypertension.    Review of Systems  Constitutional: Negative.   HENT: Negative.   Eyes: Negative.   Respiratory: Negative.   Cardiovascular: Negative.   Gastrointestinal: Negative.   Endocrine: Negative.   Genitourinary: Positive for urgency and frequency. Negative for hematuria and vaginal pain.  Musculoskeletal: Negative.   Skin: Negative.   Allergic/Immunologic: Negative.   Neurological: Negative.   Hematological: Negative.   Psychiatric/Behavioral: The patient is nervous/anxious.    Past Medical History  Diagnosis Date  . Persistent atrial fibrillation 03/05/2007    a. s/p RFCA 12/18/2011, 07/29/12  . 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 Sialoadenitis 03/18/2010  . PVC (premature ventricular contraction)   . History of MRSA infection 2008    superficial skin-cleared easily with doxycycline  . Allergic rhinitis   . Cervical disc disease   . Benign fundic gland polyps of stomach   . Diverticulitis     CT Scan   . Hemorrhoids   . Atrial flutter     a. s/p RFCA 12/18/2011  . Arthritis   . Anxiety     when tachycardia occurs  . GERD 12/23/2009    diet related-not a problem  . Hiatal hernia     small  . Headache(784.0)     migraines occ.  Marland Kitchen DVT (deep vein thrombosis) in pregnancy     s/p hip surgery    History   Social History  . Marital Status: Married   Spouse Name: N/A    Number of Children: 2  . Years of Education: N/A   Occupational History  . [employer]     Workers Comp   Social History Main Topics  . Smoking status: Never Smoker   . Smokeless tobacco: Never Used  . Alcohol Use: Yes     Comment: rare social  . Drug Use: No  . Sexual Activity: Yes     Comment: has periods q 3-4 months- no possiblity pregnant per pt.07-16-11   Other Topics Concern  . Not on file   Social History Narrative   Daily Caffeine    Pt lives in [place] with spouse.  She works as a workers Dance movement psychotherapist.    Past Surgical History  Procedure Laterality Date  . Knee surgery      x 9 Left Knee  . Upper gastrointestinal endoscopy  01/30/2010    hiatal hernia, fundic gland polyps  . Shoulder surgery      Right   . Elbow surgery      Right  . Foot surgery      Right   . Cervical fusion    . Cesarean section      x 2  . Wrist surgery      Left   . Cholecystectomy    . Flexible sigmoidoscopy      1990's  . Colonoscopy  07/16/2011    diverticulosis  . Tee without cardioversion  12/17/2011    Procedure: TRANSESOPHAGEAL ECHOCARDIOGRAM (TEE);  Surgeon: Larey Dresser, MD;  Location: [facility] ENDOSCOPY;  Service: Cardiovascular;  Laterality: N/A;  . Atrial fibrillation and atrial flutter ablation  12/17/11, 07/29/12    PVI and CTI ablations by Dr Rayann Heman  . Tee without cardioversion  07/28/2012    Procedure: TRANSESOPHAGEAL ECHOCARDIOGRAM (TEE);  Surgeon: Thayer Headings, MD;  Location: [facility] ENDOSCOPY;  Service: Cardiovascular;  Laterality: N/A;  . Atrial fibrillation ablation  07/29/2012    PVI by Dr Rayann Heman (second procedure)  . Total knee arthroplasty Right 12/12/2012    Procedure: RIGHT TOTAL KNEE ARTHROPLASTY;  Surgeon: Mauri Pole, MD;  Location: [facility] ORS;  Service: Orthopedics;  Laterality: Right;  . I&d knee with poly exchange Left 12/12/2012    Procedure: LEFT KNEE EXCISION SAPHENOUS NEUROMA/OPEN SCAR DEBRIDEMENT/POLY EXCHANGE/NERVE  EXCISION;  Surgeon: Mauri Pole, MD;  Location: [facility] ORS;  Service: Orthopedics;  Laterality: Left;  . Joint replacement Bilateral     4'14  . Total hip arthroplasty Left 10/17/2013    Procedure: LEFT TOTAL HIP ARTHROPLASTY ANTERIOR APPROACH;  Surgeon: Mauri Pole, MD;  Location: [facility] ORS;  Service: Orthopedics;  Laterality: Left;    Family History  Problem Relation Age of Onset  . Diabetes Maternal Grandfather   . Colon cancer Neg Hx   . Diverticulitis Brother     x 2  . Diverticulitis Paternal Grandmother     Allergies  Allergen Reactions  . Avelox [Moxifloxacin Hcl In Nacl] Other (See Comments)    Numbness & tingling Peripheral neuropathy    Current Outpatient Prescriptions on File Prior to Visit  Medication Sig Dispense Refill  . diazepam (VALIUM) 5 MG tablet Take 5 mg by mouth 2 (two) times daily as needed for muscle spasms.       Marland Kitchen dofetilide (TIKOSYN) 500 MCG capsule TAKE 1 CAPSULE BY MOUTH EVERY 12 HOURS      . metoprolol succinate (TOPROL-XL) 100 MG 24 hr tablet Take 100 mg (1 tablet) in the AM and 50 mg (1/2 tablet) in the PM Pt states she has been taking 100 mg BID (05/23/14)      . metoprolol tartrate (LOPRESSOR) 25 MG tablet Take 25-50 mg by mouth every 6 (six) hours as needed (A-FIB).      Marland Kitchen oxyCODONE (OXY IR/ROXICODONE) 5 MG immediate release tablet Take 1-3 tablets (5-15 mg total) by mouth every 4 (four) hours as needed for severe pain.  100 tablet  0  . pantoprazole (PROTONIX) 40 MG tablet Take 40 mg by mouth daily as needed (heartburn).      . Rivaroxaban (XARELTO) 20 MG TABS tablet Take 1 tablet (20 mg total) by mouth every morning.  30 tablet    . [DISCONTINUED] flecainide (TAMBOCOR) 100 MG tablet Take 1 tablet (100 mg total) by mouth 2 (two) times daily.  60 tablet  12   No current facility-administered medications on file prior to visit.    BP 120/76  Pulse 60  Ht 5\' 11"  (1.803 m)  Wt 265 lb 9.6 oz (120.475 kg)  BMI 37.06 kg/m2  LMP 09/01/2013chart      Objective:   Physical Exam  Constitutional: She is oriented to person, place, and time. She appears well-developed and well-nourished.  HENT:  Right Ear: External ear normal.  Left Ear: External ear normal.  Nose: Nose normal.  Mouth/Throat: Oropharynx is clear and moist.  Eyes: Conjunctivae are normal. Pupils are equal, round, and reactive to light.  Neck: Normal  range of motion. Neck supple.  Cardiovascular: Normal rate, regular rhythm and normal heart sounds.   Pulmonary/Chest: Effort normal and breath sounds normal.  Abdominal: Soft. Bowel sounds are normal.  Musculoskeletal: Normal range of motion.  Neurological: She is alert and oriented to person, place, and time.  Skin: Skin is warm and dry.  Psychiatric: She has a normal mood and affect.          Assessment & Plan:  Destiny Dennis was seen today for annual exam.  Diagnoses and associated orders for this visit:  Preventative health care - TSH - CMP - CBC with Differential - POCT urinalysis dipstick - Lipid Panel  Essential hypertension, benign - TSH - CMP - CBC with Differential - POCT urinalysis dipstick - Lipid Panel  Gastroesophageal reflux disease without esophagitis - TSH - CMP - CBC with Differential - POCT urinalysis dipstick - Lipid Panel  Need for prophylactic vaccination with combined diphtheria-tetanus-pertussis (DTP) vaccine - Tdap vaccine greater than or equal to 7yo IM  Generalized anxiety disorder  Chronic atrial fibrillation  Other Orders - buPROPion (WELLBUTRIN XL) 150 MG 24 hr tablet; Take 1 tablet (150 mg total) by mouth daily.    call the office with any questions or concerns. Recheck in 3 weeks and sooner as needed. Mammogram and colonoscopy are up-to-date. Sees gynecology for female care.

## 2014-06-13 NOTE — Patient Instructions (Signed)
Stress and Stress Management Stress is a normal reaction to life events. It is what you feel when life demands more than you are used to or more than you can handle. Some stress can be useful. For example, the stress reaction can help you catch the last bus of the day, study for a test, or meet a deadline at work. But stress that occurs too often or for too long can cause problems. It can affect your emotional health and interfere with relationships and normal daily activities. Too much stress can weaken your immune system and increase your risk for physical illness. If you already have a medical problem, stress can make it worse. CAUSES  All sorts of life events may cause stress. An event that causes stress for one person may not be stressful for another person. Major life events commonly cause stress. These may be positive or negative. Examples include losing your job, moving into a new home, getting married, having a baby, or losing a loved one. Less obvious life events may also cause stress, especially if they occur day after day or in combination. Examples include working long hours, driving in traffic, caring for children, being in debt, or being in a difficult relationship. SIGNS AND SYMPTOMS Stress may cause emotional symptoms including, the following:  Anxiety. This is feeling worried, afraid, on edge, overwhelmed, or out of control.  Anger. This is feeling irritated or impatient.  Depression. This is feeling sad, down, helpless, or guilty.  Difficulty focusing, remembering, or making decisions. Stress may cause physical symptoms, including the following:   Aches and pains. These may affect your head, neck, back, stomach, or other areas of your body.  Tight muscles or clenched jaw.  Low energy or trouble sleeping. Stress may cause unhealthy behaviors, including the following:   Eating to feel better (overeating) or skipping meals.  Sleeping too little, too much, or both.  Working  too much or putting off tasks (procrastination).  Smoking, drinking alcohol, or using drugs to feel better. DIAGNOSIS  Stress is diagnosed through an assessment by your health care provider. Your health care provider will ask questions about your symptoms and any stressful life events.Your health care provider will also ask about your medical history and may order blood tests or other tests. Certain medical conditions and medicine can cause physical symptoms similar to stress. Mental illness can cause emotional symptoms and unhealthy behaviors similar to stress. Your health care provider may refer you to a mental health professional for further evaluation.  TREATMENT  Stress management is the recommended treatment for stress.The goals of stress management are reducing stressful life events and coping with stress in healthy ways.  Techniques for reducing stressful life events include the following:  Stress identification. Self-monitor for stress and identify what causes stress for you. These skills may help you to avoid some stressful events.  Time management. Set your priorities, keep a calendar of events, and learn to say "no." These tools can help you avoid making too many commitments. Techniques for coping with stress include the following:  Rethinking the problem. Try to think realistically about stressful events rather than ignoring them or overreacting. Try to find the positives in a stressful situation rather than focusing on the negatives.  Exercise. Physical exercise can release both physical and emotional tension. The key is to find a form of exercise you enjoy and do it regularly.  Relaxation techniques. These relax the body and mind. Examples include yoga, meditation, tai chi, biofeedback, deep  breathing, progressive muscle relaxation, listening to music, being out in nature, journaling, and other hobbies. Again, the key is to find one or more that you enjoy and can do  regularly.  Healthy lifestyle. Eat a balanced diet, get plenty of sleep, and do not smoke. Avoid using alcohol or drugs to relax.  Strong support network. Spend time with family, friends, or other people you enjoy being around.Express your feelings and talk things over with someone you trust. Counseling or talktherapy with a mental health professional may be helpful if you are having difficulty managing stress on your own. Medicine is typically not recommended for the treatment of stress.Talk to your health care provider if you think you need medicine for symptoms of stress. HOME CARE INSTRUCTIONS  Keep all follow-up visits as directed by your health care provider.  Take all medicines as directed by your health care provider. SEEK MEDICAL CARE IF:  Your symptoms get worse or you start having new symptoms.  You feel overwhelmed by your problems and can no longer manage them on your own. SEEK IMMEDIATE MEDICAL CARE IF:  You feel like hurting yourself or someone else. Document Released: 01/27/2001 Document Revised: 12/18/2013 Document Reviewed: 03/28/2013 Exit[employer] Patient Information 2015 [employer]. This information is not intended to replace advice given to you by your health care provider. Make sure you discuss any questions you have with your health care provider.  

## 2014-06-14 ENCOUNTER — Telehealth: Payer: Self-pay | Admitting: Family

## 2014-06-14 NOTE — Telephone Encounter (Signed)
emmi emailed °

## 2014-06-18 ENCOUNTER — Other Ambulatory Visit: Payer: Self-pay | Admitting: Internal Medicine

## 2014-07-05 ENCOUNTER — Telehealth: Payer: Self-pay | Admitting: Internal Medicine

## 2014-07-05 NOTE — Telephone Encounter (Signed)
PT CLING RE ADDING A NEW MED, NEEDS TO TALK TO SALLY

## 2014-07-05 NOTE — Telephone Encounter (Signed)
Spoke with pt.  She was started on Wellbutrin and wanted to make sure it was okay with Tikosyn.  Okay to take- no interaction noted.

## 2014-07-17 ENCOUNTER — Ambulatory Visit: Payer: Managed Care, Other (non HMO) | Admitting: Family

## 2014-07-25 ENCOUNTER — Ambulatory Visit: Payer: Managed Care, Other (non HMO) | Admitting: Family

## 2014-07-26 ENCOUNTER — Encounter (HOSPITAL_COMMUNITY): Payer: Self-pay | Admitting: Internal Medicine

## 2014-07-31 ENCOUNTER — Other Ambulatory Visit: Payer: Self-pay | Admitting: Internal Medicine

## 2014-08-04 ENCOUNTER — Other Ambulatory Visit: Payer: Self-pay | Admitting: Internal Medicine

## 2014-08-06 ENCOUNTER — Other Ambulatory Visit: Payer: Self-pay

## 2014-08-06 MED ORDER — DOFETILIDE 500 MCG PO CAPS: ORAL_CAPSULE | ORAL | Status: DC

## 2014-08-08 ENCOUNTER — Ambulatory Visit (INDEPENDENT_AMBULATORY_CARE_PROVIDER_SITE_OTHER): Payer: Managed Care, Other (non HMO) | Admitting: Family

## 2014-08-08 ENCOUNTER — Encounter: Payer: Self-pay | Admitting: Family

## 2014-08-08 VITALS — BP 110/60 | HR 77 | Temp 97.8°F | Wt 268.0 lb

## 2014-08-08 DIAGNOSIS — F411 Generalized anxiety disorder: Secondary | ICD-10-CM

## 2014-08-08 DIAGNOSIS — K219 Gastro-esophageal reflux disease without esophagitis: Secondary | ICD-10-CM

## 2014-08-08 MED ORDER — PANTOPRAZOLE SODIUM 40 MG PO TBEC: 40.0000 mg | DELAYED_RELEASE_TABLET | Freq: Every day | ORAL | Status: DC | PRN

## 2014-08-08 NOTE — Progress Notes (Signed)
Pre visit review using our clinic review tool, if applicable. No additional management support is needed unless otherwise documented below in the visit note. 

## 2014-08-08 NOTE — Patient Instructions (Signed)
Stress and Stress Management Stress is a normal reaction to life events. It is what you feel when life demands more than you are used to or more than you can handle. Some stress can be useful. For example, the stress reaction can help you catch the last bus of the day, study for a test, or meet a deadline at work. But stress that occurs too often or for too long can cause problems. It can affect your emotional health and interfere with relationships and normal daily activities. Too much stress can weaken your immune system and increase your risk for physical illness. If you already have a medical problem, stress can make it worse. CAUSES  All sorts of life events may cause stress. An event that causes stress for one person may not be stressful for another person. Major life events commonly cause stress. These may be positive or negative. Examples include losing your job, moving into a new home, getting married, having a baby, or losing a loved one. Less obvious life events may also cause stress, especially if they occur day after day or in combination. Examples include working long hours, driving in traffic, caring for children, being in debt, or being in a difficult relationship. SIGNS AND SYMPTOMS Stress may cause emotional symptoms including, the following:  Anxiety. This is feeling worried, afraid, on edge, overwhelmed, or out of control.  Anger. This is feeling irritated or impatient.  Depression. This is feeling sad, down, helpless, or guilty.  Difficulty focusing, remembering, or making decisions. Stress may cause physical symptoms, including the following:   Aches and pains. These may affect your head, neck, back, stomach, or other areas of your body.  Tight muscles or clenched jaw.  Low energy or trouble sleeping. Stress may cause unhealthy behaviors, including the following:   Eating to feel better (overeating) or skipping meals.  Sleeping too little, too much, or both.  Working  too much or putting off tasks (procrastination).  Smoking, drinking alcohol, or using drugs to feel better. DIAGNOSIS  Stress is diagnosed through an assessment by your health care provider. Your health care provider will ask questions about your symptoms and any stressful life events.Your health care provider will also ask about your medical history and may order blood tests or other tests. Certain medical conditions and medicine can cause physical symptoms similar to stress. Mental illness can cause emotional symptoms and unhealthy behaviors similar to stress. Your health care provider may refer you to a mental health professional for further evaluation.  TREATMENT  Stress management is the recommended treatment for stress.The goals of stress management are reducing stressful life events and coping with stress in healthy ways.  Techniques for reducing stressful life events include the following:  Stress identification. Self-monitor for stress and identify what causes stress for you. These skills may help you to avoid some stressful events.  Time management. Set your priorities, keep a calendar of events, and learn to say "no." These tools can help you avoid making too many commitments. Techniques for coping with stress include the following:  Rethinking the problem. Try to think realistically about stressful events rather than ignoring them or overreacting. Try to find the positives in a stressful situation rather than focusing on the negatives.  Exercise. Physical exercise can release both physical and emotional tension. The key is to find a form of exercise you enjoy and do it regularly.  Relaxation techniques. These relax the body and mind. Examples include yoga, meditation, tai chi, biofeedback, deep  breathing, progressive muscle relaxation, listening to music, being out in nature, journaling, and other hobbies. Again, the key is to find one or more that you enjoy and can do  regularly.  Healthy lifestyle. Eat a balanced diet, get plenty of sleep, and do not smoke. Avoid using alcohol or drugs to relax.  Strong support network. Spend time with family, friends, or other people you enjoy being around.Express your feelings and talk things over with someone you trust. Counseling or talktherapy with a mental health professional may be helpful if you are having difficulty managing stress on your own. Medicine is typically not recommended for the treatment of stress.Talk to your health care provider if you think you need medicine for symptoms of stress. HOME CARE INSTRUCTIONS  Keep all follow-up visits as directed by your health care provider.  Take all medicines as directed by your health care provider. SEEK MEDICAL CARE IF:  Your symptoms get worse or you start having new symptoms.  You feel overwhelmed by your problems and can no longer manage them on your own. SEEK IMMEDIATE MEDICAL CARE IF:  You feel like hurting yourself or someone else. Document Released: 01/27/2001 Document Revised: 12/18/2013 Document Reviewed: 03/28/2013 Exit[employer] Patient Information 2015 [employer]. This information is not intended to replace advice given to you by your health care provider. Make sure you discuss any questions you have with your health care provider.  

## 2014-08-09 NOTE — Progress Notes (Signed)
Subjective:    Patient ID: Destiny Dennis, female    DOB: [birth year], 53 y.o.   MRN: [id number]  HPI 76 year white female, nonsmoker is in for recheck of anxiety. Was started on Wellbutrin but discontinue the medication after about 3 weeks due to headache and feeling dizzy. Reports her symptoms resolved after she stopped the medication. Takes Valium twice a day as needed as prescribed by specialist for neck pain. It works well for anxiety as well. She wants to continue that under the direction of the neurologist. Reports having more reflux with recently requesting a refill on prescription Protonix that typically works well for symptoms.    Review of Systems  Constitutional: Negative.   HENT: Negative.   Respiratory: Negative.   Cardiovascular: Negative.   Gastrointestinal: Negative.   Endocrine: Negative.   Genitourinary: Negative.   Musculoskeletal: Negative.   Skin: Negative.   Hematological: Negative.   Psychiatric/Behavioral: Negative.    Past Medical History  Diagnosis Date  . Persistent atrial fibrillation 03/05/2007    a. s/p RFCA 12/18/2011, 07/29/12  . 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 Sialoadenitis 03/18/2010  . PVC (premature ventricular contraction)   . History of MRSA infection 2008    superficial skin-cleared easily with doxycycline  . Allergic rhinitis   . Cervical disc disease   . Benign fundic gland polyps of stomach   . Diverticulitis     CT Scan   . Hemorrhoids   . Atrial flutter     a. s/p RFCA 12/18/2011  . Arthritis   . Anxiety     when tachycardia occurs  . GERD 12/23/2009    diet related-not a problem  . Hiatal hernia     small  . Headache(784.0)     migraines occ.  Marland Kitchen DVT (deep vein thrombosis) in pregnancy     s/p hip surgery    History   Social History  . Marital Status: Married    Spouse Name: N/A    Number of Children: 2  . Years of Education: N/A   Occupational History  . [employer]     Workers Comp   Social  History Main Topics  . Smoking status: Never Smoker   . Smokeless tobacco: Never Used  . Alcohol Use: Yes     Comment: rare social  . Drug Use: No  . Sexual Activity: Yes     Comment: has periods q 3-4 months- no possiblity pregnant per pt.07-16-11   Other Topics Concern  . Not on file   Social History Narrative   Daily Caffeine    Pt lives in [place] with spouse.  She works as a workers Dance movement psychotherapist.    Past Surgical History  Procedure Laterality Date  . Knee surgery      x 9 Left Knee  . Upper gastrointestinal endoscopy  01/30/2010    hiatal hernia, fundic gland polyps  . Shoulder surgery      Right   . Elbow surgery      Right  . Foot surgery      Right   . Cervical fusion    . Cesarean section      x 2  . Wrist surgery      Left   . Cholecystectomy    . Flexible sigmoidoscopy      1990's  . Colonoscopy  07/16/2011    diverticulosis  . Tee without cardioversion  12/17/2011    Procedure: TRANSESOPHAGEAL ECHOCARDIOGRAM (TEE);  Surgeon: Kirk Ruths  Claris Gladden, MD;  Location: [facility] ENDOSCOPY;  Service: Cardiovascular;  Laterality: N/A;  . Atrial fibrillation and atrial flutter ablation  12/17/11, 07/29/12    PVI and CTI ablations by Dr Rayann Heman  . Tee without cardioversion  07/28/2012    Procedure: TRANSESOPHAGEAL ECHOCARDIOGRAM (TEE);  Surgeon: Thayer Headings, MD;  Location: [facility] ENDOSCOPY;  Service: Cardiovascular;  Laterality: N/A;  . Atrial fibrillation ablation  07/29/2012    PVI by Dr Rayann Heman (second procedure)  . Total knee arthroplasty Right 12/12/2012    Procedure: RIGHT TOTAL KNEE ARTHROPLASTY;  Surgeon: Mauri Pole, MD;  Location: [facility] ORS;  Service: Orthopedics;  Laterality: Right;  . I&d knee with poly exchange Left 12/12/2012    Procedure: LEFT KNEE EXCISION SAPHENOUS NEUROMA/OPEN SCAR DEBRIDEMENT/POLY EXCHANGE/NERVE EXCISION;  Surgeon: Mauri Pole, MD;  Location: [facility] ORS;  Service: Orthopedics;  Laterality: Left;  . Joint replacement Bilateral     4'14  .  Total hip arthroplasty Left 10/17/2013    Procedure: LEFT TOTAL HIP ARTHROPLASTY ANTERIOR APPROACH;  Surgeon: Mauri Pole, MD;  Location: [facility] ORS;  Service: Orthopedics;  Laterality: Left;  . Atrial fibrillation ablation N/A 12/18/2011    Procedure: ATRIAL FIBRILLATION ABLATION;  Surgeon: [name], MD;  Location: [facility] CATH LAB;  Service: Cardiovascular;  Laterality: N/A;  . Atrial fibrillation ablation N/A 07/29/2012    Procedure: ATRIAL FIBRILLATION ABLATION;  Surgeon: [name], MD;  Location: [facility] CATH LAB;  Service: Cardiovascular;  Laterality: N/A;    Family History  Problem Relation Age of Onset  . Diabetes Maternal Grandfather   . Colon cancer Neg Hx   . Diverticulitis Brother     x 2  . Diverticulitis Paternal Grandmother     Allergies  Allergen Reactions  . Avelox [Moxifloxacin Hcl In Nacl] Other (See Comments)    Numbness & tingling Peripheral neuropathy    Current Outpatient Prescriptions on File Prior to Visit  Medication Sig Dispense Refill  . diazepam (VALIUM) 5 MG tablet Take 5 mg by mouth 2 (two) times daily as needed for muscle spasms.     Marland Kitchen dofetilide (TIKOSYN) 500 MCG capsule TAKE 1 CAPSULE BY MOUTH EVERY 12 HOURS 30 capsule 3  . estradiol (ESTRACE) 2 MG tablet Take 2 mg by mouth daily.    . metoprolol succinate (TOPROL-XL) 100 MG 24 hr tablet TAKE 1 TABLET BY MOUTH ONCE EVERY MORNING AND 1/2 TABLET IN THE PM AS DIRECTED 60 tablet 2  . metoprolol tartrate (LOPRESSOR) 25 MG tablet Take 25-50 mg by mouth every 6 (six) hours as needed (A-FIB).    Marland Kitchen oxyCODONE (OXY IR/ROXICODONE) 5 MG immediate release tablet Take 1-3 tablets (5-15 mg total) by mouth every 4 (four) hours as needed for severe pain. 100 tablet 0  . progesterone (PROMETRIUM) 100 MG capsule Take 100 mg by mouth daily.    . Rivaroxaban (XARELTO) 20 MG TABS tablet Take 1 tablet (20 mg total) by mouth every morning. 30 tablet   . [DISCONTINUED] flecainide (TAMBOCOR) 100 MG tablet Take 1 tablet (100 mg total)  by mouth 2 (two) times daily. 60 tablet 12   No current facility-administered medications on file prior to visit.    BP 110/60 mmHg  Pulse 77  Temp(Src) 97.8 F (36.6 C) (Oral)  Wt 268 lb (121.564 kg)  LMP 09/01/2013chart    Objective:   Physical Exam  Constitutional: She is oriented to person, place, and time. She appears well-developed and well-nourished.  HENT:  Right Ear: External ear normal.  Left Ear: External ear normal.  Mouth/Throat: Oropharynx is clear and moist.  Eyes: Pupils are equal, round, and reactive to light.  Neck: Normal range of motion. Neck supple.  Cardiovascular: Normal rate, regular rhythm and normal heart sounds.   Pulmonary/Chest: Effort normal and breath sounds normal.  Abdominal: Soft. Bowel sounds are normal.  Musculoskeletal: Normal range of motion.  Neurological: She is alert and oriented to person, place, and time.  Skin: Skin is warm and dry.  Psychiatric: She has a normal mood and affect.          Assessment & Plan:  Destiny Dennis was seen today for follow-up.  Diagnoses and associated orders for this visit:  Generalized anxiety disorder  Gastroesophageal reflux disease without esophagitis  Other Orders - pantoprazole (PROTONIX) 40 MG tablet; Take 1 tablet (40 mg total) by mouth daily as needed (heartburn).    Continue current medications. Renew Protonix. Call the office with any questions or concerns. Recheck as scheduled and sooner as needed.

## 2014-08-31 ENCOUNTER — Telehealth: Payer: Self-pay | Admitting: Internal Medicine

## 2014-08-31 NOTE — Telephone Encounter (Addendum)
Called patient and this morning she had a sharpe pain this am  Took Tylenol and it went away with movement and stretching.  Let her know this is most likely musculoskeletal.  She will call back if the symptoms persist

## 2014-08-31 NOTE — Telephone Encounter (Signed)
New Msg      Pt c/o of Chest Pain: 1. Are you having CP right now? No  2. Are you experiencing any other symptoms (ex. SOB, nausea, vomiting, sweating) No 3. How long have you been experiencing CP? This morning  4. Is your CP continuous or coming and going? Off and on pain 5. Have you taken Nitroglycerin? No    Muscle spasms in thoracic spine, pt took one 325 mg tylenol today as well  Pt would like a call back.

## 2014-09-24 ENCOUNTER — Encounter: Payer: Self-pay | Admitting: Family Medicine

## 2014-09-24 ENCOUNTER — Telehealth: Payer: Self-pay | Admitting: Internal Medicine

## 2014-09-24 ENCOUNTER — Ambulatory Visit (INDEPENDENT_AMBULATORY_CARE_PROVIDER_SITE_OTHER): Payer: Managed Care, Other (non HMO) | Admitting: Family Medicine

## 2014-09-24 VITALS — BP 116/80 | HR 75 | Temp 97.8°F | Ht 71.0 in | Wt 264.6 lb

## 2014-09-24 DIAGNOSIS — J069 Acute upper respiratory infection, unspecified: Secondary | ICD-10-CM

## 2014-09-24 MED ORDER — HYDROCODONE-HOMATROPINE 5-1.5 MG/5ML PO SYRP: 5.0000 mL | ORAL_SOLUTION | Freq: Three times a day (TID) | ORAL | Status: DC | PRN

## 2014-09-24 NOTE — Telephone Encounter (Signed)
New message      For Gay Filler Pt is on tikosyn.  Her PCP prescribed hydrocodein cough syrup and flonase nose spray  Is this ok

## 2014-09-24 NOTE — Patient Instructions (Signed)
INSTRUCTIONS FOR UPPER RESPIRATORY INFECTION:  -viral upper respiratory infections usually last about 7-21 days and the cough can sometimes remain longer  -plenty of rest and fluids  -nasal saline wash 2-3 times daily (use prepackaged nasal saline or bottled/distilled water if making your own)   -can use afrin nasal spray for drainage and nasal congestion - but do NOT use longer then 3-4 days  -flonase daily for 21 days  -can use tylenol as directed for aches and sorethroat  -in the winter time, using a humidifier at night is helpful (please follow cleaning instructions)  -if you are taking a cough medication - use only as directed, may also try a teaspoon of honey to coat the throat and throat lozenges  -for sore throat, salt water gargles can help  -follow up if you have fevers, facial pain, tooth pain, difficulty breathing or are worsening or not getting better in 5-7 days

## 2014-09-24 NOTE — Progress Notes (Signed)
HPI:  Destiny Dennis is a 54 yo patient of Roxy Cedar here for and acute visit for:   URI: -started: 1 week ago -symptoms: cough, congestion, HA, PND, laryngitis, body aches initially -denies:SOB, fever in last several days, sinus pain, NVD -sick contacts: kids with VURI -has tried: hydocan from home and wants this for cough -had flu shot this year   ROS: See pertinent positives and negatives per HPI.  Past Medical History  Diagnosis Date  . Persistent atrial fibrillation 03/05/2007    a. s/p RFCA 12/18/2011, 07/29/12  . 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 Sialoadenitis 03/18/2010  . PVC (premature ventricular contraction)   . History of MRSA infection 2008    superficial skin-cleared easily with doxycycline  . Allergic rhinitis   . Cervical disc disease   . Benign fundic gland polyps of stomach   . Diverticulitis     CT Scan   . Hemorrhoids   . Atrial flutter     a. s/p RFCA 12/18/2011  . Arthritis   . Anxiety     when tachycardia occurs  . GERD 12/23/2009    diet related-not a problem  . Hiatal hernia     small  . Headache(784.0)     migraines occ.  Marland Kitchen DVT (deep vein thrombosis) in pregnancy     s/p hip surgery    Past Surgical History  Procedure Laterality Date  . Knee surgery      x 9 Left Knee  . Upper gastrointestinal endoscopy  01/30/2010    hiatal hernia, fundic gland polyps  . Shoulder surgery      Right   . Elbow surgery      Right  . Foot surgery      Right   . Cervical fusion    . Cesarean section      x 2  . Wrist surgery      Left   . Cholecystectomy    . Flexible sigmoidoscopy      1990's  . Colonoscopy  07/16/2011    diverticulosis  . Tee without cardioversion  12/17/2011    Procedure: TRANSESOPHAGEAL ECHOCARDIOGRAM (TEE);  Surgeon: Larey Dresser, MD;  Location: [facility] ENDOSCOPY;  Service: Cardiovascular;  Laterality: N/A;  . Atrial fibrillation and atrial flutter ablation  12/17/11, 07/29/12    PVI and CTI ablations by Dr  Rayann Heman  . Tee without cardioversion  07/28/2012    Procedure: TRANSESOPHAGEAL ECHOCARDIOGRAM (TEE);  Surgeon: Thayer Headings, MD;  Location: [facility] ENDOSCOPY;  Service: Cardiovascular;  Laterality: N/A;  . Atrial fibrillation ablation  07/29/2012    PVI by Dr Rayann Heman (second procedure)  . Total knee arthroplasty Right 12/12/2012    Procedure: RIGHT TOTAL KNEE ARTHROPLASTY;  Surgeon: Mauri Pole, MD;  Location: [facility] ORS;  Service: Orthopedics;  Laterality: Right;  . I&d knee with poly exchange Left 12/12/2012    Procedure: LEFT KNEE EXCISION SAPHENOUS NEUROMA/OPEN SCAR DEBRIDEMENT/POLY EXCHANGE/NERVE EXCISION;  Surgeon: Mauri Pole, MD;  Location: [facility] ORS;  Service: Orthopedics;  Laterality: Left;  . Joint replacement Bilateral     4'14  . Total hip arthroplasty Left 10/17/2013    Procedure: LEFT TOTAL HIP ARTHROPLASTY ANTERIOR APPROACH;  Surgeon: Mauri Pole, MD;  Location: [facility] ORS;  Service: Orthopedics;  Laterality: Left;  . Atrial fibrillation ablation N/A 12/18/2011    Procedure: ATRIAL FIBRILLATION ABLATION;  Surgeon: [name], MD;  Location: [facility] CATH LAB;  Service: Cardiovascular;  Laterality: N/A;  . Atrial fibrillation ablation N/A  07/29/2012    Procedure: ATRIAL FIBRILLATION ABLATION;  Surgeon: [name], MD;  Location: [facility] CATH LAB;  Service: Cardiovascular;  Laterality: N/A;    Family History  Problem Relation Age of Onset  . Diabetes Maternal Grandfather   . Colon cancer Neg Hx   . Diverticulitis Brother     x 2  . Diverticulitis Paternal Grandmother     History   Social History  . Marital Status: Married    Spouse Name: N/A    Number of Children: 2  . Years of Education: N/A   Occupational History  . [employer]     Workers Comp   Social History Main Topics  . Smoking status: Never Smoker   . Smokeless tobacco: Never Used  . Alcohol Use: Yes     Comment: rare social  . Drug Use: No  . Sexual Activity: Yes     Comment: has periods q 3-4 months- no  possiblity pregnant per pt.07-16-11   Other Topics Concern  . None   Social History Narrative   Daily Caffeine    Pt lives in [place] with spouse.  She works as a workers Dance movement psychotherapist.     Current outpatient prescriptions:  .  diazepam (VALIUM) 5 MG tablet, Take 5 mg by mouth 2 (two) times daily as needed for muscle spasms. , Disp: , Rfl:  .  dofetilide (TIKOSYN) 500 MCG capsule, TAKE 1 CAPSULE BY MOUTH EVERY 12 HOURS, Disp: 30 capsule, Rfl: 3 .  estradiol (ESTRACE) 2 MG tablet, Take 2 mg by mouth daily., Disp: , Rfl:  .  metoprolol succinate (TOPROL-XL) 100 MG 24 hr tablet, TAKE 1 TABLET BY MOUTH ONCE EVERY MORNING AND 1/2 TABLET IN THE PM AS DIRECTED, Disp: 60 tablet, Rfl: 2 .  metoprolol tartrate (LOPRESSOR) 25 MG tablet, Take 25-50 mg by mouth every 6 (six) hours as needed (A-FIB)., Disp: , Rfl:  .  oxyCODONE (OXY IR/ROXICODONE) 5 MG immediate release tablet, Take 1-3 tablets (5-15 mg total) by mouth every 4 (four) hours as needed for severe pain., Disp: 100 tablet, Rfl: 0 .  pantoprazole (PROTONIX) 40 MG tablet, Take 1 tablet (40 mg total) by mouth daily as needed (heartburn)., Disp: 90 tablet, Rfl: 1 .  progesterone (PROMETRIUM) 100 MG capsule, Take 100 mg by mouth daily., Disp: , Rfl:  .  Rivaroxaban (XARELTO) 20 MG TABS tablet, Take 1 tablet (20 mg total) by mouth every morning., Disp: 30 tablet, Rfl:  .  [DISCONTINUED] flecainide (TAMBOCOR) 100 MG tablet, Take 1 tablet (100 mg total) by mouth 2 (two) times daily., Disp: 60 tablet, Rfl: 12  EXAM:  Filed Vitals:   09/24/14 1052  BP: 116/80  Pulse: 75  Temp: 97.8 F (36.6 C)    Body mass index is 36.92 kg/(m^2).  GENERAL: vitals reviewed and listed above, alert, oriented, appears well hydrated and in no acute distress  HEENT: atraumatic, conjunttiva clear, no obvious abnormalities on inspection of external nose and ears, normal appearance of ear canals and TMs, clear nasal congestion, mild post oropharyngeal  erythema with PND, no tonsillar edema or exudate, no sinus TTP  NECK: no obvious masses on inspection  LUNGS: clear to auscultation bilaterally, no wheezes, rales or rhonchi, good air movement  CV: HRRR, no peripheral edema  MS: moves all extremities without noticeable abnormality  PSYCH: pleasant and cooperative, no obvious depression or anxiety  ASSESSMENT AND PLAN:  Discussed the following assessment and plan:  Acute upper respiratory infection  -Patient advised  to return or notify a doctor immediately if symptoms worsen or persist or new concerns arise.  Patient Instructions  INSTRUCTIONS FOR UPPER RESPIRATORY INFECTION:  -viral upper respiratory infections usually last about 7-21 days and the cough can sometimes remain longer  -plenty of rest and fluids  -nasal saline wash 2-3 times daily (use prepackaged nasal saline or bottled/distilled water if making your own)   -can use afrin nasal spray for drainage and nasal congestion - but do NOT use longer then 3-4 days  -flonase daily for 21 days  -can use tylenol as directed for aches and sorethroat  -in the winter time, using a humidifier at night is helpful (please follow cleaning instructions)  -if you are taking a cough medication - use only as directed, may also try a teaspoon of honey to coat the throat and throat lozenges  -for sore throat, salt water gargles can help  -follow up if you have fevers, facial pain, tooth pain, difficulty breathing or are worsening or not getting better in 5-7 days      KIM, HANNAH R.

## 2014-09-24 NOTE — Progress Notes (Signed)
Pre visit review using our clinic review tool, if applicable. No additional management support is needed unless otherwise documented below in the visit note. 

## 2014-09-25 NOTE — Telephone Encounter (Signed)
No problems with this combination

## 2014-10-12 ENCOUNTER — Ambulatory Visit (INDEPENDENT_AMBULATORY_CARE_PROVIDER_SITE_OTHER): Payer: Managed Care, Other (non HMO) | Admitting: Family Medicine

## 2014-10-12 ENCOUNTER — Encounter: Payer: Self-pay | Admitting: Family Medicine

## 2014-10-12 VITALS — BP 128/86 | HR 73 | Temp 97.6°F | Ht 71.0 in | Wt 264.1 lb

## 2014-10-12 DIAGNOSIS — J069 Acute upper respiratory infection, unspecified: Secondary | ICD-10-CM

## 2014-10-12 MED ORDER — PREDNISONE 20 MG PO TABS: 40.0000 mg | ORAL_TABLET | Freq: Every day | ORAL | Status: DC

## 2014-10-12 MED ORDER — TRAMADOL HCL 50 MG PO TABS: 50.0000 mg | ORAL_TABLET | Freq: Three times a day (TID) | ORAL | Status: DC | PRN

## 2014-10-12 NOTE — Progress Notes (Signed)
HPI:  URI: -started: 3 weeks ago - saw [facility]CC twice initially and given amox and ceftin and short course low dose steroids which did not help, had CXR about 1 week ago and normal -symptoms:nasal congestion, sore throat, cough, sinus pain initially, mild SOB initially - no most symptoms improving but continues cough, alb helps  -denies:fever, SOB, NVD, tooth pain -has tried: per above -sick contacts/travel/risks: denies flu exposure, tick exposure or or Ebola risks -Hx of: allergies - no hx of asthma, hx of GERD   ROS: See pertinent positives and negatives per HPI.  Past Medical History  Diagnosis Date  . Persistent atrial fibrillation 03/05/2007    a. s/p RFCA 12/18/2011, 07/29/12  . 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 Sialoadenitis 03/18/2010  . PVC (premature ventricular contraction)   . History of MRSA infection 2008    superficial skin-cleared easily with doxycycline  . Allergic rhinitis   . Cervical disc disease   . Benign fundic gland polyps of stomach   . Diverticulitis     CT Scan   . Hemorrhoids   . Atrial flutter     a. s/p RFCA 12/18/2011  . Arthritis   . Anxiety     when tachycardia occurs  . GERD 12/23/2009    diet related-not a problem  . Hiatal hernia     small  . Headache(784.0)     migraines occ.  Marland Kitchen DVT (deep vein thrombosis) in pregnancy     s/p hip surgery    Past Surgical History  Procedure Laterality Date  . Knee surgery      x 9 Left Knee  . Upper gastrointestinal endoscopy  01/30/2010    hiatal hernia, fundic gland polyps  . Shoulder surgery      Right   . Elbow surgery      Right  . Foot surgery      Right   . Cervical fusion    . Cesarean section      x 2  . Wrist surgery      Left   . Cholecystectomy    . Flexible sigmoidoscopy      1990's  . Colonoscopy  07/16/2011    diverticulosis  . Tee without cardioversion  12/17/2011    Procedure: TRANSESOPHAGEAL ECHOCARDIOGRAM (TEE);  Surgeon: Larey Dresser, MD;  Location: [facility]  ENDOSCOPY;  Service: Cardiovascular;  Laterality: N/A;  . Atrial fibrillation and atrial flutter ablation  12/17/11, 07/29/12    PVI and CTI ablations by Dr Rayann Heman  . Tee without cardioversion  07/28/2012    Procedure: TRANSESOPHAGEAL ECHOCARDIOGRAM (TEE);  Surgeon: Thayer Headings, MD;  Location: [facility] ENDOSCOPY;  Service: Cardiovascular;  Laterality: N/A;  . Atrial fibrillation ablation  07/29/2012    PVI by Dr Rayann Heman (second procedure)  . Total knee arthroplasty Right 12/12/2012    Procedure: RIGHT TOTAL KNEE ARTHROPLASTY;  Surgeon: Mauri Pole, MD;  Location: [facility] ORS;  Service: Orthopedics;  Laterality: Right;  . I&d knee with poly exchange Left 12/12/2012    Procedure: LEFT KNEE EXCISION SAPHENOUS NEUROMA/OPEN SCAR DEBRIDEMENT/POLY EXCHANGE/NERVE EXCISION;  Surgeon: Mauri Pole, MD;  Location: [facility] ORS;  Service: Orthopedics;  Laterality: Left;  . Joint replacement Bilateral     4'14  . Total hip arthroplasty Left 10/17/2013    Procedure: LEFT TOTAL HIP ARTHROPLASTY ANTERIOR APPROACH;  Surgeon: Mauri Pole, MD;  Location: [facility] ORS;  Service: Orthopedics;  Laterality: Left;  . Atrial fibrillation ablation N/A 12/18/2011    Procedure:  ATRIAL FIBRILLATION ABLATION;  Surgeon: [name], MD;  Location: [facility] CATH LAB;  Service: Cardiovascular;  Laterality: N/A;  . Atrial fibrillation ablation N/A 07/29/2012    Procedure: ATRIAL FIBRILLATION ABLATION;  Surgeon: [name], MD;  Location: [facility] CATH LAB;  Service: Cardiovascular;  Laterality: N/A;    Family History  Problem Relation Age of Onset  . Diabetes Maternal Grandfather   . Colon cancer Neg Hx   . Diverticulitis Brother     x 2  . Diverticulitis Paternal Grandmother     History   Social History  . Marital Status: Married    Spouse Name: N/A  . Number of Children: 2  . Years of Education: N/A   Occupational History  . [employer]     Workers Comp   Social History Main Topics  . Smoking status: Never Smoker   . Smokeless  tobacco: Never Used  . Alcohol Use: Yes     Comment: rare social  . Drug Use: No  . Sexual Activity: Yes     Comment: has periods q 3-4 months- no possiblity pregnant per pt.07-16-11   Other Topics Concern  . None   Social History Narrative   Daily Caffeine    Pt lives in [place] with spouse.  She works as a workers Dance movement psychotherapist.     Current outpatient prescriptions:  .  diazepam (VALIUM) 5 MG tablet, Take 5 mg by mouth 2 (two) times daily as needed for muscle spasms. , Disp: , Rfl:  .  dofetilide (TIKOSYN) 500 MCG capsule, TAKE 1 CAPSULE BY MOUTH EVERY 12 HOURS, Disp: 30 capsule, Rfl: 3 .  estradiol (ESTRACE) 2 MG tablet, Take 2 mg by mouth daily., Disp: , Rfl:  .  GuaiFENesin (MUCINEX PO), Take by mouth., Disp: , Rfl:  .  HYDROcodone-homatropine (HYCODAN) 5-1.5 MG/5ML syrup, Take 5 mLs by mouth every 8 (eight) hours as needed for cough., Disp: 120 mL, Rfl: 0 .  metoprolol succinate (TOPROL-XL) 100 MG 24 hr tablet, TAKE 1 TABLET BY MOUTH ONCE EVERY MORNING AND 1/2 TABLET IN THE PM AS DIRECTED, Disp: 60 tablet, Rfl: 2 .  metoprolol tartrate (LOPRESSOR) 25 MG tablet, Take 25-50 mg by mouth every 6 (six) hours as needed (A-FIB)., Disp: , Rfl:  .  oxyCODONE (OXY IR/ROXICODONE) 5 MG immediate release tablet, Take 1-3 tablets (5-15 mg total) by mouth every 4 (four) hours as needed for severe pain., Disp: 100 tablet, Rfl: 0 .  pantoprazole (PROTONIX) 40 MG tablet, Take 1 tablet (40 mg total) by mouth daily as needed (heartburn)., Disp: 90 tablet, Rfl: 1 .  PROAIR HFA 108 (90 BASE) MCG/ACT inhaler, , Disp: , Rfl: 0 .  progesterone (PROMETRIUM) 100 MG capsule, Take 100 mg by mouth daily., Disp: , Rfl:  .  Rivaroxaban (XARELTO) 20 MG TABS tablet, Take 1 tablet (20 mg total) by mouth every morning., Disp: 30 tablet, Rfl:  .  predniSONE (DELTASONE) 20 MG tablet, Take 2 tablets (40 mg total) by mouth daily with breakfast., Disp: 8 tablet, Rfl: 0 .  traMADol (ULTRAM) 50 MG tablet, Take 1  tablet (50 mg total) by mouth every 8 (eight) hours as needed., Disp: 10 tablet, Rfl: 0 .  [DISCONTINUED] flecainide (TAMBOCOR) 100 MG tablet, Take 1 tablet (100 mg total) by mouth 2 (two) times daily., Disp: 60 tablet, Rfl: 12  EXAM:  Filed Vitals:   10/12/14 1120  BP: 128/86  Pulse: 73  Temp: 97.6 F (36.4 C)    Body mass  index is 36.85 kg/(m^2).  GENERAL: vitals reviewed and listed above, alert, oriented, appears well hydrated and in no acute distress  HEENT: atraumatic, conjunttiva clear, no obvious abnormalities on inspection of external nose and ears, normal appearance of ear canals and TMs, clear nasal congestion, mild post oropharyngeal erythema with PND, no tonsillar edema or exudate, no sinus TTP  NECK: no obvious masses on inspection  LUNGS: clear to auscultation bilaterally, no wheezes, rales or rhonchi, good air movement  CV: HRRR, no peripheral edema  MS: moves all extremities without noticeable abnormality  PSYCH: pleasant and cooperative, no obvious depression or anxiety  ASSESSMENT AND PLAN:  Discussed the following assessment and plan:  Acute upper respiratory infection - Plan: predniSONE (DELTASONE) 20 MG tablet, traMADol (ULTRAM) 50 MG tablet  -given HPI and exam findings today, a serious infection or illness is unlikely. We discussed potential etiologies, with VURI being most likely, and advised supportive care and monitoring. We discussed treatment side effects, likely course, antibiotic misuse, transmission, and signs of developing a serious illness. -likely post viral - discussed options -of course, we advised to return or notify a doctor immediately if symptoms worsen or persist or new concerns arise.    Patient Instructions  -take the prednisone 40 mg daily for 4 days  -prilosec 20mg  daily for 2 weeks  -alb as needed  -would advise you to see a pulmonologist if worsens or persists         KIM, HANNAH R.

## 2014-10-12 NOTE — Patient Instructions (Signed)
-  take the prednisone 40 mg daily for 4 days  -prilosec 20mg  daily for 2 weeks  -alb as needed  -would advise you to see a pulmonologist if worsens or persists

## 2014-10-12 NOTE — Progress Notes (Signed)
Pre visit review using our clinic review tool, if applicable. No additional management support is needed unless otherwise documented below in the visit note. 

## 2014-10-21 ENCOUNTER — Other Ambulatory Visit: Payer: Self-pay | Admitting: Internal Medicine

## 2014-10-30 ENCOUNTER — Ambulatory Visit: Payer: Managed Care, Other (non HMO) | Admitting: Family Medicine

## 2014-11-17 ENCOUNTER — Other Ambulatory Visit: Payer: Self-pay | Admitting: Internal Medicine

## 2014-11-19 ENCOUNTER — Ambulatory Visit (INDEPENDENT_AMBULATORY_CARE_PROVIDER_SITE_OTHER): Payer: Managed Care, Other (non HMO) | Admitting: Internal Medicine

## 2014-11-19 ENCOUNTER — Encounter: Payer: Self-pay | Admitting: Internal Medicine

## 2014-11-19 VITALS — BP 130/70 | HR 69 | Temp 98.0°F | Resp 20 | Ht 71.0 in | Wt 262.0 lb

## 2014-11-19 DIAGNOSIS — R300 Dysuria: Secondary | ICD-10-CM

## 2014-11-19 DIAGNOSIS — I480 Paroxysmal atrial fibrillation: Secondary | ICD-10-CM

## 2014-11-19 LAB — POCT URINALYSIS DIPSTICK
Bilirubin, UA: NEGATIVE
Glucose, UA: NEGATIVE
Ketones, UA: NEGATIVE
Leukocytes, UA: NEGATIVE
Nitrite, UA: NEGATIVE
PH UA: 5.5
Protein, UA: NEGATIVE
Spec Grav, UA: 1.025
Urobilinogen, UA: 0.2

## 2014-11-19 NOTE — Patient Instructions (Signed)
Call or return to clinic prn if these symptoms worsen or fail to improve as anticipated.

## 2014-11-19 NOTE — Progress Notes (Signed)
Pre visit review using our clinic review tool, if applicable. No additional management support is needed unless otherwise documented below in the visit note. 

## 2014-11-19 NOTE — Progress Notes (Signed)
Subjective:    Patient ID: Destiny Dennis, female    DOB: Dec 28, 1960, 54 y.o.   MRN: [id number]  HPI 54 year old patient who had the abrupt onset of some dysuria and frequency earlier today.  This afternoon.  Symptoms have largely resolved.  Urinalysis was fairly unremarkable.  The patient does have a history of stress incontinence and has been treated by GYN with estradiol cream and also referred for bladder training/physical therapy. She does take Zarontin for paroxysmal atrial fibrillation. Past Medical History  Diagnosis Date  . Persistent atrial fibrillation 03/05/2007    a. s/p RFCA 12/18/2011, 07/29/12  . 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 Sialoadenitis 03/18/2010  . PVC (premature ventricular contraction)   . History of MRSA infection 2008    superficial skin-cleared easily with doxycycline  . Allergic rhinitis   . Cervical disc disease   . Benign fundic gland polyps of stomach   . Diverticulitis     CT Scan   . Hemorrhoids   . Atrial flutter     a. s/p RFCA 12/18/2011  . Arthritis   . Anxiety     when tachycardia occurs  . GERD 12/23/2009    diet related-not a problem  . Hiatal hernia     small  . Headache(784.0)     migraines occ.  Marland Kitchen DVT (deep vein thrombosis) in pregnancy     s/p hip surgery    History   Social History  . Marital Status: Married    Spouse Name: N/A  . Number of Children: 2  . Years of Education: N/A   Occupational History  . [employer]     Workers Comp   Social History Main Topics  . Smoking status: Never Smoker   . Smokeless tobacco: Never Used  . Alcohol Use: Yes     Comment: rare social  . Drug Use: No  . Sexual Activity: Yes     Comment: has periods q 3-4 months- no possiblity pregnant per pt.07-16-11   Other Topics Concern  . Not on file   Social History Narrative   Daily Caffeine    Pt lives in [place] with spouse.  She works as a workers Dance movement psychotherapist.    Past Surgical History  Procedure  Laterality Date  . Knee surgery      x 9 Left Knee  . Upper gastrointestinal endoscopy  01/30/2010    hiatal hernia, fundic gland polyps  . Shoulder surgery      Right   . Elbow surgery      Right  . Foot surgery      Right   . Cervical fusion    . Cesarean section      x 2  . Wrist surgery      Left   . Cholecystectomy    . Flexible sigmoidoscopy      1990's  . Colonoscopy  07/16/2011    diverticulosis  . Tee without cardioversion  12/17/2011    Procedure: TRANSESOPHAGEAL ECHOCARDIOGRAM (TEE);  Surgeon: Larey Dresser, MD;  Location: [facility] ENDOSCOPY;  Service: Cardiovascular;  Laterality: N/A;  . Atrial fibrillation and atrial flutter ablation  12/17/11, 07/29/12    PVI and CTI ablations by Dr Rayann Heman  . Tee without cardioversion  07/28/2012    Procedure: TRANSESOPHAGEAL ECHOCARDIOGRAM (TEE);  Surgeon: Thayer Headings, MD;  Location: [facility] ENDOSCOPY;  Service: Cardiovascular;  Laterality: N/A;  . Atrial fibrillation ablation  07/29/2012    PVI by Dr Rayann Heman (second procedure)  .  Total knee arthroplasty Right 12/12/2012    Procedure: RIGHT TOTAL KNEE ARTHROPLASTY;  Surgeon: Mauri Pole, MD;  Location: [facility] ORS;  Service: Orthopedics;  Laterality: Right;  . I&d knee with poly exchange Left 12/12/2012    Procedure: LEFT KNEE EXCISION SAPHENOUS NEUROMA/OPEN SCAR DEBRIDEMENT/POLY EXCHANGE/NERVE EXCISION;  Surgeon: Mauri Pole, MD;  Location: [facility] ORS;  Service: Orthopedics;  Laterality: Left;  . Joint replacement Bilateral     4'14  . Total hip arthroplasty Left 10/17/2013    Procedure: LEFT TOTAL HIP ARTHROPLASTY ANTERIOR APPROACH;  Surgeon: Mauri Pole, MD;  Location: [facility] ORS;  Service: Orthopedics;  Laterality: Left;  . Atrial fibrillation ablation N/A 12/18/2011    Procedure: ATRIAL FIBRILLATION ABLATION;  Surgeon: [name], MD;  Location: [facility] CATH LAB;  Service: Cardiovascular;  Laterality: N/A;  . Atrial fibrillation ablation N/A 07/29/2012    Procedure: ATRIAL FIBRILLATION ABLATION;   Surgeon: [name], MD;  Location: [facility] CATH LAB;  Service: Cardiovascular;  Laterality: N/A;    Family History  Problem Relation Age of Onset  . Diabetes Maternal Grandfather   . Colon cancer Neg Hx   . Diverticulitis Brother     x 2  . Diverticulitis Paternal Grandmother     Allergies  Allergen Reactions  . Avelox [Moxifloxacin Hcl In Nacl] Other (See Comments)    Numbness & tingling Peripheral neuropathy    Current Outpatient Prescriptions on File Prior to Visit  Medication Sig Dispense Refill  . diazepam (VALIUM) 5 MG tablet Take 5 mg by mouth 2 (two) times daily as needed for muscle spasms.     Marland Kitchen estradiol (ESTRACE) 2 MG tablet Take 2 mg by mouth daily.    . metoprolol succinate (TOPROL-XL) 100 MG 24 hr tablet TAKE 1 TABLET BY MOUTH ONCE EVERY MORNING AND 1/2 TABLET IN THE PM AS DIRECTED 60 tablet 1  . metoprolol tartrate (LOPRESSOR) 25 MG tablet Take 25-50 mg by mouth every 6 (six) hours as needed (A-FIB).    . pantoprazole (PROTONIX) 40 MG tablet Take 1 tablet (40 mg total) by mouth daily as needed (heartburn). 90 tablet 1  . progesterone (PROMETRIUM) 100 MG capsule Take 100 mg by mouth daily.    . Rivaroxaban (XARELTO) 20 MG TABS tablet Take 1 tablet (20 mg total) by mouth every morning. 30 tablet   . TIKOSYN 500 MCG capsule TAKE 1 CAPSULE EVERY 12 HOURS. *NEEDS APPT FOR FURTHER REFILLS 60 capsule 1  . traMADol (ULTRAM) 50 MG tablet Take 1 tablet (50 mg total) by mouth every 8 (eight) hours as needed. 10 tablet 0  . [DISCONTINUED] flecainide (TAMBOCOR) 100 MG tablet Take 1 tablet (100 mg total) by mouth 2 (two) times daily. 60 tablet 12   No current facility-administered medications on file prior to visit.    BP 130/70 mmHg  Pulse 69  Temp(Src) 98 F (36.7 C) (Oral)  Resp 20  Ht 5\' 11"  (1.803 m)  Wt 262 lb (118.842 kg)  BMI 36.56 kg/m2  SpO2 99%  LMP 04/17/2012        Review of Systems  Genitourinary: Positive for dysuria and frequency. Negative for  urgency.       Objective:   Physical Exam  Constitutional: She appears well-developed and well-nourished. No distress.          Assessment & Plan:  History of stress incontinence Episode of dysuria and frequency.  Now symptoms have resolved.  Urinalysis normal.  Will observe at this point.  Will call if  any clinical change

## 2014-12-14 ENCOUNTER — Other Ambulatory Visit: Payer: Self-pay | Admitting: Internal Medicine

## 2014-12-18 ENCOUNTER — Other Ambulatory Visit: Payer: Self-pay

## 2014-12-18 DIAGNOSIS — Z1231 Encounter for screening mammogram for malignant neoplasm of breast: Secondary | ICD-10-CM

## 2014-12-19 ENCOUNTER — Telehealth: Payer: Self-pay | Admitting: Pharmacist

## 2014-12-19 NOTE — Telephone Encounter (Signed)
Follow Up  Pt called States that per her last ov in October she was advised to follow up with Tikosyn. Please let us know if the pt can or cannot wait to schedule until June. Pt has a follow up appt sch in June with Dr. Rayann Heman. Please advise

## 2014-12-20 ENCOUNTER — Other Ambulatory Visit: Payer: Self-pay | Admitting: Internal Medicine

## 2014-12-20 NOTE — Telephone Encounter (Signed)
Dr. Rayann Heman suggested follow up in 6 months at her visit in October.  This would have been April.  Would suggest follow up as soon as Dr. Rayann Heman has something available.

## 2014-12-25 ENCOUNTER — Ambulatory Visit: Payer: Managed Care, Other (non HMO)

## 2014-12-28 ENCOUNTER — Other Ambulatory Visit: Payer: Self-pay | Admitting: Family

## 2014-12-28 NOTE — Telephone Encounter (Signed)
Please set her up with a transfer visit with cory or me as I don't think she has done this since padonda left. Ok to refill her protonix for 3 months.

## 2015-01-03 ENCOUNTER — Ambulatory Visit
Admission: RE | Admit: 2015-01-03 | Discharge: 2015-01-03 | Disposition: A | Discharge status: Home / Self Care | Payer: Managed Care, Other (non HMO) | Source: Ambulatory Visit

## 2015-01-03 DIAGNOSIS — Z1231 Encounter for screening mammogram for malignant neoplasm of breast: Secondary | ICD-10-CM

## 2015-01-10 ENCOUNTER — Other Ambulatory Visit: Payer: Self-pay | Admitting: Internal Medicine

## 2015-01-23 ENCOUNTER — Other Ambulatory Visit: Payer: Self-pay | Admitting: Internal Medicine

## 2015-01-23 ENCOUNTER — Ambulatory Visit (INDEPENDENT_AMBULATORY_CARE_PROVIDER_SITE_OTHER): Payer: Managed Care, Other (non HMO) | Admitting: Internal Medicine

## 2015-01-23 ENCOUNTER — Encounter: Payer: Self-pay | Admitting: Internal Medicine

## 2015-01-23 VITALS — BP 126/70 | HR 53 | Ht 71.0 in | Wt 267.2 lb

## 2015-01-23 DIAGNOSIS — I480 Paroxysmal atrial fibrillation: Secondary | ICD-10-CM

## 2015-01-23 LAB — BASIC METABOLIC PANEL
BUN: 13 mg/dL (ref 6–23)
CO2: 27 meq/L (ref 19–32)
CREATININE: 0.74 mg/dL (ref 0.40–1.20)
Calcium: 9.3 mg/dL (ref 8.4–10.5)
Chloride: 102 mEq/L (ref 96–112)
GFR: 87.66 mL/min (ref >60.00)
GLUCOSE: 89 mg/dL (ref 70–99)
Potassium: 4 mEq/L (ref 3.5–5.1)
Sodium: 136 mEq/L (ref 135–145)

## 2015-01-23 LAB — MAGNESIUM: Magnesium: 2.1 mg/dL (ref 1.5–2.5)

## 2015-01-23 MED ORDER — METOPROLOL SUCCINATE ER 100 MG PO TB24: ORAL_TABLET | ORAL | Status: DC

## 2015-01-23 NOTE — Patient Instructions (Signed)
Medication Instructions:  Your physician recommends that you continue on your current medications as directed. Please refer to the Current Medication list given to you today.   Labwork:  Your physician recommends that you return for lab work today: BMP/MAG    Testing/Procedures: None ordered  Follow-Up: Your physician wants you to follow-up in: 6 months with Dr Rayann Heman Dennis Bast will receive a reminder letter in the mail two months in advance. If you don't receive a letter, please call our office to schedule the follow-up appointment.   Any Other Special Instructions Will Be Listed Below (If Applicable).

## 2015-01-23 NOTE — Progress Notes (Signed)
PCP: Lucretia Kern., DO Primary Cardiologist:  Dr Clide Dales is a 54 y.o. female who presents today for routine electrophysiology followup.  She is doing very well with tikosyn.   She has rare palpitations (likely pac's) for which she takes additional metoprolol.  Today, she denies symptoms of chest pain, shortness of breath,  lower extremity edema, dizziness, presyncope, or syncope.  The patient is otherwise without complaint today.   Past Medical History  Diagnosis Date  . Persistent atrial fibrillation 03/05/2007    a. s/p RFCA 12/18/2011, 07/29/12  . 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 Sialoadenitis 03/18/2010  . PVC (premature ventricular contraction)   . History of MRSA infection 2008    superficial skin-cleared easily with doxycycline  . Allergic rhinitis   . Cervical disc disease   . Benign fundic gland polyps of stomach   . Diverticulitis     CT Scan   . Hemorrhoids   . Atrial flutter     a. s/p RFCA 12/18/2011  . Arthritis   . Anxiety     when tachycardia occurs  . GERD 12/23/2009    diet related-not a problem  . Hiatal hernia     small  . Headache(784.0)     migraines occ.  Marland Kitchen DVT (deep vein thrombosis) in pregnancy     s/p hip surgery   Past Surgical History  Procedure Laterality Date  . Knee surgery      x 9 Left Knee  . Upper gastrointestinal endoscopy  01/30/2010    hiatal hernia, fundic gland polyps  . Shoulder surgery      Right   . Elbow surgery      Right  . Foot surgery      Right   . Cervical fusion    . Cesarean section      x 2  . Wrist surgery      Left   . Cholecystectomy    . Flexible sigmoidoscopy      1990's  . Colonoscopy  07/16/2011    diverticulosis  . Tee without cardioversion  12/17/2011    Procedure: TRANSESOPHAGEAL ECHOCARDIOGRAM (TEE);  Surgeon: Larey Dresser, MD;  Location: [facility] ENDOSCOPY;  Service: Cardiovascular;  Laterality: N/A;  . Atrial fibrillation and atrial flutter ablation  12/17/11, 07/29/12    PVI  and CTI ablations by Dr Rayann Heman  . Tee without cardioversion  07/28/2012    Procedure: TRANSESOPHAGEAL ECHOCARDIOGRAM (TEE);  Surgeon: Thayer Headings, MD;  Location: [facility] ENDOSCOPY;  Service: Cardiovascular;  Laterality: N/A;  . Atrial fibrillation ablation  07/29/2012    PVI by Dr Rayann Heman (second procedure)  . Total knee arthroplasty Right 12/12/2012    Procedure: RIGHT TOTAL KNEE ARTHROPLASTY;  Surgeon: Mauri Pole, MD;  Location: [facility] ORS;  Service: Orthopedics;  Laterality: Right;  . I&d knee with poly exchange Left 12/12/2012    Procedure: LEFT KNEE EXCISION SAPHENOUS NEUROMA/OPEN SCAR DEBRIDEMENT/POLY EXCHANGE/NERVE EXCISION;  Surgeon: Mauri Pole, MD;  Location: [facility] ORS;  Service: Orthopedics;  Laterality: Left;  . Joint replacement Bilateral     4'14  . Total hip arthroplasty Left 10/17/2013    Procedure: LEFT TOTAL HIP ARTHROPLASTY ANTERIOR APPROACH;  Surgeon: Mauri Pole, MD;  Location: [facility] ORS;  Service: Orthopedics;  Laterality: Left;  . Atrial fibrillation ablation N/A 12/18/2011    Procedure: ATRIAL FIBRILLATION ABLATION;  Surgeon: [name], MD;  Location: [facility] CATH LAB;  Service: Cardiovascular;  Laterality: N/A;  . Atrial fibrillation ablation  N/A 07/29/2012    Procedure: ATRIAL FIBRILLATION ABLATION;  Surgeon: [name], MD;  Location: [facility] CATH LAB;  Service: Cardiovascular;  Laterality: N/A;    ROS-L ankle pain limits activity, all other systems reviewed and negative except as per above  Current Outpatient Prescriptions  Medication Sig Dispense Refill  . diazepam (VALIUM) 5 MG tablet Take 5 mg by mouth 2 (two) times daily as needed for muscle spasms.     Marland Kitchen estradiol (ESTRACE) 2 MG tablet Take 2 mg by mouth daily.    . metoprolol succinate (TOPROL-XL) 100 MG 24 hr tablet TAKE 1 TABLET BY MOUTH ONCE EVERY MORNING AND 1/2 TABLET IN THE PM AS DIRECTED 45 tablet 0  . metoprolol tartrate (LOPRESSOR) 25 MG tablet Take 25-50 mg by mouth every 6 (six) hours as needed (A-FIB).    .  pantoprazole (PROTONIX) 40 MG tablet TAKE 1 TABLET (40 MG TOTAL) BY MOUTH DAILY AS NEEDED (HEARTBURN). 90 tablet 0  . progesterone (PROMETRIUM) 100 MG capsule Take 100 mg by mouth daily.    . Rivaroxaban (XARELTO) 20 MG TABS tablet Take 1 tablet (20 mg total) by mouth every morning. 30 tablet   . TIKOSYN 500 MCG capsule TAKE 1 CAPSULE EVERY 12 HOURS. *NEEDS APPT FOR FURTHER REFILLS 60 capsule 1  . [DISCONTINUED] flecainide (TAMBOCOR) 100 MG tablet Take 1 tablet (100 mg total) by mouth 2 (two) times daily. 60 tablet 12   No current facility-administered medications for this visit.    Physical Exam: Filed Vitals:   01/23/15 1049  BP: 126/70  Pulse: 53  Height: 5\' 11"  (1.803 m)  Weight: 121.201 kg (267 lb 3.2 oz)    GEN- The patient is well appearing, alert and oriented x 3 today.   Head- normocephalic, atraumatic Eyes-  Sclera clear, conjunctiva pink Ears- hearing intact Oropharynx- clear Lungs- Clear to ausculation bilaterally, normal work of breathing Heart- irregular rate and rhythm, no murmurs, rubs or gallops, PMI not laterally displaced GI- soft, NT, ND, + BS Extremities- no clubbing, cyanosis, or edema  ekg today reveals sinus rhytm 53 bpm, Qtc 418  Assessment and Plan:  1. Afib Continue xarelto and tikosyn Bmet, mg today  2. DVT Continue xarelto long term  Return to see me in 6 months  [name] MD, [facility] 01/23/2015 11:25 AM

## 2015-01-25 ENCOUNTER — Other Ambulatory Visit: Payer: Self-pay | Admitting: Internal Medicine

## 2015-01-28 ENCOUNTER — Other Ambulatory Visit: Payer: Self-pay | Admitting: Internal Medicine

## 2015-02-10 ENCOUNTER — Other Ambulatory Visit: Payer: Self-pay | Admitting: Internal Medicine

## 2015-02-11 ENCOUNTER — Other Ambulatory Visit: Payer: Self-pay

## 2015-02-11 MED ORDER — RIVAROXABAN 20 MG PO TABS: 20.0000 mg | ORAL_TABLET | Freq: Every morning | ORAL | Status: DC

## 2015-02-13 ENCOUNTER — Telehealth: Payer: Self-pay | Admitting: *Deleted

## 2015-02-13 DIAGNOSIS — I480 Paroxysmal atrial fibrillation: Secondary | ICD-10-CM

## 2015-02-13 MED ORDER — METOPROLOL SUCCINATE ER 100 MG PO TB24: 100.0000 mg | ORAL_TABLET | Freq: Two times a day (BID) | ORAL | Status: DC

## 2015-02-13 NOTE — Telephone Encounter (Signed)
Patient called and stated that she is taking the metoprolol succinate 100mg  bid and has been for a while, not 1.5 tablets qd as the rx was sent in at her visit on 01/23/15. Please advise. Thanks, MI

## 2015-02-20 ENCOUNTER — Telehealth: Payer: Self-pay | Admitting: Internal Medicine

## 2015-02-20 NOTE — Telephone Encounter (Signed)
Reviewed message Patient called at 420p on mobile left VM

## 2015-02-20 NOTE — Telephone Encounter (Signed)
Patient c/o Palpitations:  High priority if patient c/o lightheadedness and shortness of breath.  1. How long have you been having palpitations? For a couple of weeks. ( Continuous for 6 hours and then it may skip 3 days and become irregular again)   2. Are you currently experiencing lightheadedness and shortness of breath? No. Not at this time. Light headed on and off (not constant)   3. Have you checked your BP and heart rate? (document readings)  Will discuss the BP readings when the nurse calls back   4. Are you experiencing any other symptoms? No.

## 2015-02-21 NOTE — Telephone Encounter (Signed)
Talked with patient she is fine today and does not wish to make an appointment at this time.  She took down the afib clinic phone number to call if needed if the frequency of afib episodes increases. Patient was appreciative of my call.

## 2015-02-21 NOTE — Telephone Encounter (Signed)
Talked with Korrina this morning by phone.  Is having her typical frequency with periods of atrial fib however she is having more dizziness with episodes and during the episodes heart rhythm  Seems more erratic.  Yesterday heart rate during atrial fib episode up to 123.  Took a lopressor 25 mg as ordered yesterday.   Michela Pitcher it settled her down and her heart rate down. Reviewed with her how to take it and when to take it as ordered.

## 2015-02-21 NOTE — Telephone Encounter (Signed)
Please schedule to follow-up with Roderic Palau NP in the AF clinic

## 2015-03-14 ENCOUNTER — Other Ambulatory Visit: Payer: Self-pay | Admitting: Internal Medicine

## 2015-03-14 ENCOUNTER — Telehealth: Payer: Self-pay

## 2015-03-14 NOTE — Telephone Encounter (Signed)
Prior auth obtained for Xarelto20mg  through [facility], via Cover [employer] Meds.

## 2015-03-15 ENCOUNTER — Telehealth: Payer: Self-pay

## 2015-03-15 NOTE — Telephone Encounter (Signed)
Approval for Xarelto from [facility]  Good through 03/13/2016.

## 2015-03-25 ENCOUNTER — Other Ambulatory Visit: Payer: Self-pay

## 2015-03-25 MED ORDER — DOFETILIDE 500 MCG PO CAPS: ORAL_CAPSULE | ORAL | Status: DC

## 2015-03-29 ENCOUNTER — Telehealth: Payer: Self-pay | Admitting: Internal Medicine

## 2015-03-29 NOTE — Telephone Encounter (Signed)
New message      Pt is on xarelto.  She hit her head this am.  Should she be concerned?

## 2015-03-29 NOTE — Telephone Encounter (Signed)
[place] and [place]pped and fell.  She says this was I the middle of the night.  She went back to sleep and got up this morning with a knot on her head.  She denies nausea, dizziness, blurred vision, lethargy.  Only c/o headache and knot on head.  I have advised her to be seen by MD on ship if any symptoms arise.  She verbalized understand and agreed with plan

## 2015-04-24 ENCOUNTER — Telehealth (HOSPITAL_COMMUNITY): Payer: Self-pay | Admitting: *Deleted

## 2015-04-24 NOTE — Telephone Encounter (Signed)
Patient called in stating she has been in AFib all day today where normally she does not stay in afib this long and was concerned.  States she is having some dizziness which is normal with her afib. HR is running between 100-150s. She does not own a blood pressure cuff so is unable to take this.  Patient has PRN metoprolol prescribed but had not taken one today. Instructed patient to take PRN metoprolol now and let us know if this does not help and would bring her in for EKG in the morning. Patient verbalized understanding.

## 2015-04-29 ENCOUNTER — Telehealth: Payer: Self-pay | Admitting: Family Medicine

## 2015-04-29 NOTE — Telephone Encounter (Signed)
Pt had cancel her est visit back in March because she was out of town. Pt would like to know if Dr Maudie Mercury will still except her.

## 2015-04-29 NOTE — Telephone Encounter (Signed)
Ok. In available 30 minute slot.

## 2015-05-15 ENCOUNTER — Telehealth (HOSPITAL_COMMUNITY): Payer: Self-pay | Admitting: *Deleted

## 2015-05-15 MED ORDER — TIKOSYN 500 MCG PO CAPS: 500.0000 ug | ORAL_CAPSULE | Freq: Two times a day (BID) | ORAL | Status: DC

## 2015-05-15 NOTE — Telephone Encounter (Signed)
Patient called in stated she was not feeling well today checked her vitals bp 116/67 hr 43. Feeling slightly dizzy.  States she did take 2 extra doses of metoprolol yesterday due to elevated heart rate.  Instructed her to drink extra glass of water today and if she continues to see a trend of heart rate in the 40s to call back and will need to be seen.  Also states she was recently changed from brand to generic tikosyn and noticed an increase in her afib so her pharmacist recently switched her back to brand tikosyn.  Will put notation on chart for brand name only tikosyn.  Patient was appreciative of advice and will call back if further action required.

## 2015-05-21 ENCOUNTER — Ambulatory Visit (HOSPITAL_COMMUNITY)
Admission: RE | Admit: 2015-05-21 | Discharge: 2015-05-21 | Disposition: A | Discharge status: Home / Self Care | Payer: Managed Care, Other (non HMO) | Source: Ambulatory Visit | Attending: Nurse Practitioner | Admitting: Nurse Practitioner

## 2015-05-21 ENCOUNTER — Encounter (HOSPITAL_COMMUNITY): Payer: Self-pay | Admitting: Nurse Practitioner

## 2015-05-21 VITALS — BP 122/78 | HR 128 | Ht 71.0 in | Wt 267.6 lb

## 2015-05-21 DIAGNOSIS — Z7902 Long term (current) use of antithrombotics/antiplatelets: Secondary | ICD-10-CM | POA: Diagnosis not present

## 2015-05-21 DIAGNOSIS — I4891 Unspecified atrial fibrillation: Secondary | ICD-10-CM | POA: Insufficient documentation

## 2015-05-21 DIAGNOSIS — K219 Gastro-esophageal reflux disease without esophagitis: Secondary | ICD-10-CM | POA: Insufficient documentation

## 2015-05-21 DIAGNOSIS — I480 Paroxysmal atrial fibrillation: Secondary | ICD-10-CM

## 2015-05-21 DIAGNOSIS — Z79899 Other long term (current) drug therapy: Secondary | ICD-10-CM | POA: Insufficient documentation

## 2015-05-21 DIAGNOSIS — Z86718 Personal history of other venous thrombosis and embolism: Secondary | ICD-10-CM | POA: Diagnosis not present

## 2015-05-21 LAB — BASIC METABOLIC PANEL
Anion gap: 10 (ref 5–15)
BUN: 13 mg/dL (ref 6–20)
CO2: 25 mmol/L (ref 22–32)
Calcium: 9.4 mg/dL (ref 8.9–10.3)
Chloride: 105 mmol/L (ref 101–111)
Creatinine, Ser: 0.71 mg/dL (ref 0.44–1.00)
GFR calc Af Amer: >60 mL/min (ref >60)
GLUCOSE: 108 mg/dL — AB (ref 65–99)
POTASSIUM: 4 mmol/L (ref 3.5–5.1)
Sodium: 140 mmol/L (ref 135–145)

## 2015-05-21 LAB — TSH: TSH: 1.597 u[IU]/mL (ref 0.350–4.500)

## 2015-05-21 LAB — CBC
HEMATOCRIT: 40.5 % (ref 36.0–46.0)
Hemoglobin: 13.4 g/dL (ref 12.0–15.0)
MCH: 28.7 pg (ref 26.0–34.0)
MCHC: 33.1 g/dL (ref 30.0–36.0)
MCV: 86.7 fL (ref 78.0–100.0)
PLATELETS: 306 10*3/uL (ref 150–400)
RBC: 4.67 MIL/uL (ref 3.87–5.11)
RDW: 13.5 % (ref 11.5–15.5)
WBC: 6.4 10*3/uL (ref 4.0–10.5)

## 2015-05-21 LAB — MAGNESIUM: Magnesium: 2 mg/dL (ref 1.7–2.4)

## 2015-05-22 ENCOUNTER — Encounter (HOSPITAL_COMMUNITY): Payer: Self-pay | Admitting: Nurse Practitioner

## 2015-05-22 NOTE — Progress Notes (Signed)
Patient ID: Destiny Dennis, female   DOB: Jun 05, 1961, 54 y.o.   MRN: [id number]     Primary Care Physician: Lucretia Kern., DO Referring Physician: Dr. Rennis Harding is a 54 y.o. female with a h/o persistent afib s/p ablation x 2, 5/13 and 12/13 and currently on tikosyn, here for evaluation. She had ben doing well maintaining SR until July, when she started having a lot of PAF. This coincided with her drugstore giving her generic tikosyn. After six weeks of having more afib, she requested brand tikosyn. She has been back on brand x 4 days. She uses extra metoprolol with breakthrough afib but then when converts has issues with bradycardia and fatigue. She presented today with afib with rvr but then converted in the office to [place].  Today, she denies symptoms of palpitations, chest pain, shortness of breath, orthopnea, PND, lower extremity edema, dizziness, presyncope, syncope, or neurologic sequela. The patient is tolerating medications without difficulties and is otherwise without complaint today.   Past Medical History  Diagnosis Date  . Persistent atrial fibrillation ([place]) 03/05/2007    a. s/p RFCA 12/18/2011, 07/29/12  . 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 Sialoadenitis 03/18/2010  . PVC (premature ventricular contraction)   . History of MRSA infection 2008    superficial skin-cleared easily with doxycycline  . Allergic rhinitis   . Cervical disc disease   . Benign fundic gland polyps of stomach   . Diverticulitis     CT Scan   . Hemorrhoids   . Atrial flutter ([place])     a. s/p RFCA 12/18/2011  . Arthritis   . Anxiety     when tachycardia occurs  . GERD 12/23/2009    diet related-not a problem  . Hiatal hernia     small  . Headache(784.0)     migraines occ.  Marland Kitchen DVT (deep vein thrombosis) in pregnancy     s/p hip surgery   Past Surgical History  Procedure Laterality Date  . Knee surgery      x 9 Left Knee  . Upper gastrointestinal endoscopy  01/30/2010   hiatal hernia, fundic gland polyps  . Shoulder surgery      Right   . Elbow surgery      Right  . Foot surgery      Right   . Cervical fusion    . Cesarean section      x 2  . Wrist surgery      Left   . Cholecystectomy    . Flexible sigmoidoscopy      1990's  . Colonoscopy  07/16/2011    diverticulosis  . Tee without cardioversion  12/17/2011    Procedure: TRANSESOPHAGEAL ECHOCARDIOGRAM (TEE);  Surgeon: Larey Dresser, MD;  Location: [facility] ENDOSCOPY;  Service: Cardiovascular;  Laterality: N/A;  . Atrial fibrillation and atrial flutter ablation  12/17/11, 07/29/12    PVI and CTI ablations by Dr Rayann Heman  . Tee without cardioversion  07/28/2012    Procedure: TRANSESOPHAGEAL ECHOCARDIOGRAM (TEE);  Surgeon: Thayer Headings, MD;  Location: [facility] ENDOSCOPY;  Service: Cardiovascular;  Laterality: N/A;  . Atrial fibrillation ablation  07/29/2012    PVI by Dr Rayann Heman (second procedure)  . Total knee arthroplasty Right 12/12/2012    Procedure: RIGHT TOTAL KNEE ARTHROPLASTY;  Surgeon: Mauri Pole, MD;  Location: [facility] ORS;  Service: Orthopedics;  Laterality: Right;  . I&d knee with poly exchange Left 12/12/2012    Procedure: LEFT KNEE EXCISION SAPHENOUS  NEUROMA/OPEN SCAR DEBRIDEMENT/POLY EXCHANGE/NERVE EXCISION;  Surgeon: Mauri Pole, MD;  Location: [facility] ORS;  Service: Orthopedics;  Laterality: Left;  . Joint replacement Bilateral     4'14  . Total hip arthroplasty Left 10/17/2013    Procedure: LEFT TOTAL HIP ARTHROPLASTY ANTERIOR APPROACH;  Surgeon: Mauri Pole, MD;  Location: [facility] ORS;  Service: Orthopedics;  Laterality: Left;  . Atrial fibrillation ablation N/A 12/18/2011    Procedure: ATRIAL FIBRILLATION ABLATION;  Surgeon: [name], MD;  Location: [facility] CATH LAB;  Service: Cardiovascular;  Laterality: N/A;  . Atrial fibrillation ablation N/A 07/29/2012    Procedure: ATRIAL FIBRILLATION ABLATION;  Surgeon: [name], MD;  Location: [facility] CATH LAB;  Service: Cardiovascular;  Laterality: N/A;     Current Outpatient Prescriptions  Medication Sig Dispense Refill  . acetaminophen (TYLENOL) 500 MG tablet Take 1,000 mg by mouth every 6 (six) hours as needed.    . diazepam (VALIUM) 5 MG tablet Take 5 mg by mouth 2 (two) times daily as needed for muscle spasms.     Marland Kitchen estradiol (ESTRACE) 2 MG tablet Take 2 mg by mouth daily.    . metoprolol succinate (TOPROL-XL) 100 MG 24 hr tablet Take 1 tablet (100 mg total) by mouth 2 (two) times daily. 180 tablet 3  . metoprolol tartrate (LOPRESSOR) 25 MG tablet TAKE 1 TO 2 TABLETS BY MOUTH EVERY 6 HOURS AS NEEDED 30 tablet 3  . pantoprazole (PROTONIX) 40 MG tablet TAKE 1 TABLET (40 MG TOTAL) BY MOUTH DAILY AS NEEDED (HEARTBURN). 90 tablet 0  . progesterone (PROMETRIUM) 100 MG capsule Take 100 mg by mouth daily.    . rivaroxaban (XARELTO) 20 MG TABS tablet Take 1 tablet (20 mg total) by mouth every morning. 30 tablet 11  . TIKOSYN 500 MCG capsule Take 1 capsule (500 mcg total) by mouth 2 (two) times daily.    . [DISCONTINUED] flecainide (TAMBOCOR) 100 MG tablet Take 1 tablet (100 mg total) by mouth 2 (two) times daily. 60 tablet 12   No current facility-administered medications for this encounter.    Allergies  Allergen Reactions  . Avelox [Moxifloxacin Hcl In Nacl] Other (See Comments)    Numbness & tingling Peripheral neuropathy    Social History   Social History  . Marital Status: Married    Spouse Name: N/A  . Number of Children: 2  . Years of Education: N/A   Occupational History  . [employer]     Workers Comp   Social History Main Topics  . Smoking status: Never Smoker   . Smokeless tobacco: Never Used  . Alcohol Use: Yes     Comment: rare social  . Drug Use: No  . Sexual Activity: Yes     Comment: has periods q 3-4 months- no possiblity pregnant per pt.07-16-11   Other Topics Concern  . Not on file   Social History Narrative   Daily Caffeine    Pt lives in [place] with spouse.  She works as a workers Administrator.    Family History  Problem Relation Age of Onset  . Diabetes Maternal Grandfather   . Colon cancer Neg Hx   . Diverticulitis Brother     x 2  . Diverticulitis Paternal Grandmother     ROS- All systems are reviewed and negative except as per the HPI above  Physical Exam: Filed Vitals:   05/21/15 0837  BP: 122/78  Pulse: 128  Height: 5\' 11"  (1.803 m)  Weight: 267 lb 9.6  oz (121.383 kg)    GEN- The patient is well appearing, alert and oriented x 3 today.   Head- normocephalic, atraumatic Eyes-  Sclera clear, conjunctiva pink Ears- hearing intact Oropharynx- clear Neck- supple, no JVP Lymph- no cervical lymphadenopathy Lungs- Clear to ausculation bilaterally, normal work of breathing Heart- Regular rate and rhythm, no murmurs, rubs or gallops, PMI not laterally displaced GI- soft, NT, ND, + BS Extremities- no clubbing, cyanosis, or edema MS- no significant deformity or atrophy Skin- no rash or lesion Psych- euthymic mood, full affect Neuro- strength and sensation are intact  EKG-first EKG- afib with rvr at 123 bpm, then second EKG SR at 66 bpm,PR int 172 ms, QRS 102 ms, QTc427 bpm  Assessment and Plan: 1. Afib More frequent with change to generic tikosyn, now back on brand x 4-5 dys Give brand another 5-7 days on board to see effect If no improvement, Dr. Rayann Heman suggests an appointment with him to discuss a possible 3rd abaltion Continue xarelto, metoprolol Bmet/mag today  Butch Penny C. [place], [facility] [address] [place], [place] [address] [phone]

## 2015-06-10 ENCOUNTER — Telehealth: Payer: Self-pay | Admitting: Internal Medicine

## 2015-06-10 ENCOUNTER — Other Ambulatory Visit: Payer: Self-pay | Admitting: *Deleted

## 2015-06-10 MED ORDER — TIKOSYN 500 MCG PO CAPS: 500.0000 ug | ORAL_CAPSULE | Freq: Two times a day (BID) | ORAL | Status: DC

## 2015-06-10 NOTE — Telephone Encounter (Signed)
Returned patient's call about Tikosyn 529mcg twice daily. Patient states current bottle is written 555mcg once daily with 60 capsules but Epic is saying 541mcg twice daily. [facility] advised her it must be a typo and to make sure we have not changed her rx. Her visit with [facility] states 536mcg twice daily. She does not use [facility] only [facility], and expressed she can only use name brand Tikosyn.

## 2015-06-10 NOTE — Telephone Encounter (Signed)
New message       STAT if patient is at the pharmacy , call can be transferred to refill team.   1. Which medications need to be refilled? tikosyn 2. Which pharmacy[facility]location [facility] 3. Do they need a 30 day or 90 day supply? 30 Clarify directions----once a day or twice a day

## 2015-06-27 NOTE — Telephone Encounter (Signed)
error 

## 2015-06-28 ENCOUNTER — Encounter: Payer: Self-pay | Admitting: Family Medicine

## 2015-06-28 ENCOUNTER — Ambulatory Visit (INDEPENDENT_AMBULATORY_CARE_PROVIDER_SITE_OTHER): Payer: Managed Care, Other (non HMO) | Admitting: Family Medicine

## 2015-06-28 VITALS — BP 116/70 | HR 69 | Temp 97.4°F | Ht 71.0 in | Wt 266.4 lb

## 2015-06-28 DIAGNOSIS — K219 Gastro-esophageal reflux disease without esophagitis: Secondary | ICD-10-CM | POA: Diagnosis not present

## 2015-06-28 DIAGNOSIS — M159 Polyosteoarthritis, unspecified: Secondary | ICD-10-CM

## 2015-06-28 DIAGNOSIS — I4819 Other persistent atrial fibrillation: Secondary | ICD-10-CM

## 2015-06-28 DIAGNOSIS — I481 Persistent atrial fibrillation: Secondary | ICD-10-CM | POA: Diagnosis not present

## 2015-06-28 DIAGNOSIS — E669 Obesity, unspecified: Secondary | ICD-10-CM | POA: Diagnosis not present

## 2015-06-28 DIAGNOSIS — M1990 Unspecified osteoarthritis, unspecified site: Secondary | ICD-10-CM | POA: Insufficient documentation

## 2015-06-28 DIAGNOSIS — I471 Supraventricular tachycardia: Secondary | ICD-10-CM | POA: Diagnosis not present

## 2015-06-28 DIAGNOSIS — Z7189 Other specified counseling: Secondary | ICD-10-CM

## 2015-06-28 DIAGNOSIS — J302 Other seasonal allergic rhinitis: Secondary | ICD-10-CM

## 2015-06-28 DIAGNOSIS — Z7689 Persons encountering health services in other specified circumstances: Secondary | ICD-10-CM

## 2015-06-28 NOTE — Patient Instructions (Signed)
BEFORE YOU LEAVE: -schedule physical exam in 3-4 months  Try flonase for the allergies  We recommend the following healthy lifestyle measures: - eat a healthy whole foods diet consisting of regular small meals composed of vegetables, fruits, beans, nuts, seeds, healthy meats such as white chicken and fish and whole grains.  - avoid sweets, white starchy foods, fried foods, fast food, processed foods, sodas, red meet and other fattening foods.  - get a least 150-300 minutes of aerobic exercise per week.

## 2015-06-28 NOTE — Progress Notes (Signed)
HPI:  Destiny Dennis is here to establish care.  Last PCP and physical:  Has the following chronic problems that require follow up and concerns today:  Hx A. Fib/A. Flutter/SVT/Hx DVT: -s/p ablation x2 in 2013 -managed by cardiologist, Dr. Rayann Heman -brand tikosyn, metoprolol, xarelto -saw cardiologist recently, notes reviewed -denies: CP, SOB, DOE, palpitations, swelling  Allergies: -sees allergist -always worse in the fall -takes claritin  GERD: -hx hiatal hernia -uses PPI rarley  OA: -difuse -s/p multiple surgeries -sees ortho, uses valium for muscle spasm in neck   ROS negative for unless reported above: fevers, unintentional weight loss, hearing or vision loss, chest pain, palpitations, struggling to breath, hemoptysis, melena, hematochezia, hematuria, falls, loc, si, thoughts of self harm  Past Medical History  Diagnosis Date  . Persistent atrial fibrillation ([place]) 03/05/2007    a. s/p RFCA 12/18/2011, 07/29/12  . Atrial flutter ([place])   . SVT (supraventricular tachycardia) ([place]) 03/18/2010    PVCs  . DVT (deep venous thrombosis) ([place])     in pregnancy, s/p hip surgery  . History of MRSA infection 2008    superficial skin-cleared easily with doxycycline  . Allergic rhinitis   . Cervical disc disease   . Benign fundic gland polyps of stomach   . Diverticulitis     CT Scan   . Hemorrhoids   . Arthritis     OA, s/p numerous surgeries -back, knees, hip  . Anxiety     when tachycardia occurs  . GERD 12/23/2009    diet related-not a problem  . Hiatal hernia     small  . Headache(784.0)     migraines occ.  . Eustachian tube dysfunction   . Sialoadenitis   . Obesity     Past Surgical History  Procedure Laterality Date  . Knee surgery      x 9 Left Knee  . Upper gastrointestinal endoscopy  01/30/2010    hiatal hernia, fundic gland polyps  . Shoulder surgery      Right   . Elbow surgery      Right  . Foot surgery      Right   . Cervical fusion    .  Cesarean section      x 2  . Wrist surgery      Left   . Cholecystectomy    . Flexible sigmoidoscopy      1990's  . Colonoscopy  07/16/2011    diverticulosis  . Tee without cardioversion  12/17/2011    Procedure: TRANSESOPHAGEAL ECHOCARDIOGRAM (TEE);  Surgeon: Larey Dresser, MD;  Location: [facility] ENDOSCOPY;  Service: Cardiovascular;  Laterality: N/A;  . Atrial fibrillation and atrial flutter ablation  12/17/11, 07/29/12    PVI and CTI ablations by Dr Rayann Heman  . Tee without cardioversion  07/28/2012    Procedure: TRANSESOPHAGEAL ECHOCARDIOGRAM (TEE);  Surgeon: Thayer Headings, MD;  Location: [facility] ENDOSCOPY;  Service: Cardiovascular;  Laterality: N/A;  . Atrial fibrillation ablation  07/29/2012    PVI by Dr Rayann Heman (second procedure)  . Total knee arthroplasty Right 12/12/2012    Procedure: RIGHT TOTAL KNEE ARTHROPLASTY;  Surgeon: Mauri Pole, MD;  Location: [facility] ORS;  Service: Orthopedics;  Laterality: Right;  . I&d knee with poly exchange Left 12/12/2012    Procedure: LEFT KNEE EXCISION SAPHENOUS NEUROMA/OPEN SCAR DEBRIDEMENT/POLY EXCHANGE/NERVE EXCISION;  Surgeon: Mauri Pole, MD;  Location: [facility] ORS;  Service: Orthopedics;  Laterality: Left;  . Joint replacement Bilateral     4'14  . Total  hip arthroplasty Left 10/17/2013    Procedure: LEFT TOTAL HIP ARTHROPLASTY ANTERIOR APPROACH;  Surgeon: Mauri Pole, MD;  Location: [facility] ORS;  Service: Orthopedics;  Laterality: Left;  . Atrial fibrillation ablation N/A 12/18/2011    Procedure: ATRIAL FIBRILLATION ABLATION;  Surgeon: [name], MD;  Location: [facility] CATH LAB;  Service: Cardiovascular;  Laterality: N/A;  . Atrial fibrillation ablation N/A 07/29/2012    Procedure: ATRIAL FIBRILLATION ABLATION;  Surgeon: [name], MD;  Location: [facility] CATH LAB;  Service: Cardiovascular;  Laterality: N/A;    Family History  Problem Relation Age of Onset  . Diabetes Maternal Grandfather   . Colon cancer Neg Hx   . Diverticulitis Brother     x 2  .  Diverticulitis Paternal Grandmother     Social History   Social History  . Marital Status: Married    Spouse Name: N/A  . Number of Children: 2  . Years of Education: N/A   Occupational History  . [employer]     Workers Comp   Social History Main Topics  . Smoking status: Never Smoker   . Smokeless tobacco: Never Used  . Alcohol Use: Yes     Comment: rare social  . Drug Use: No  . Sexual Activity: Yes     Comment: has periods q 3-4 months- no possiblity pregnant per pt.07-16-11   Other Topics Concern  . Not on file   Social History Narrative   Updated 06/2015   Daily Caffeine    Pt lives in [place] with spouse and two children 86 and 12 yo in 2-16.  She works as a workers Dance movement psychotherapist.   Christian   No regular exercise, diet ok     Current outpatient prescriptions:  .  acetaminophen (TYLENOL) 500 MG tablet, Take 1,000 mg by mouth every 6 (six) hours as needed., Disp: , Rfl:  .  diazepam (VALIUM) 5 MG tablet, Take 5 mg by mouth 2 (two) times daily as needed for muscle spasms. , Disp: , Rfl:  .  estradiol (ESTRACE) 2 MG tablet, Take 2 mg by mouth daily., Disp: , Rfl:  .  metoprolol succinate (TOPROL-XL) 100 MG 24 hr tablet, Take 1 tablet (100 mg total) by mouth 2 (two) times daily., Disp: 180 tablet, Rfl: 3 .  metoprolol tartrate (LOPRESSOR) 25 MG tablet, TAKE 1 TO 2 TABLETS BY MOUTH EVERY 6 HOURS AS NEEDED, Disp: 30 tablet, Rfl: 3 .  pantoprazole (PROTONIX) 40 MG tablet, TAKE 1 TABLET (40 MG TOTAL) BY MOUTH DAILY AS NEEDED (HEARTBURN)., Disp: 90 tablet, Rfl: 0 .  progesterone (PROMETRIUM) 100 MG capsule, Take 100 mg by mouth daily., Disp: , Rfl:  .  rivaroxaban (XARELTO) 20 MG TABS tablet, Take 1 tablet (20 mg total) by mouth every morning., Disp: 30 tablet, Rfl: 11 .  TIKOSYN 500 MCG capsule, Take 1 capsule (500 mcg total) by mouth 2 (two) times daily., Disp: 60 capsule, Rfl: 2 .  [DISCONTINUED] flecainide (TAMBOCOR) 100 MG tablet, Take 1 tablet (100 mg  total) by mouth 2 (two) times daily., Disp: 60 tablet, Rfl: 12  EXAM:  Filed Vitals:   06/28/15 1323  BP: 116/70  Pulse: 69  Temp: 97.4 F (36.3 C)    Body mass index is 37.17 kg/(m^2).  GENERAL: vitals reviewed and listed above, alert, oriented, appears well hydrated and in no acute distress  HEENT: atraumatic, conjunttiva clear, no obvious abnormalities on inspection of external nose and ears  NECK: no obvious masses on inspection  LUNGS: clear to auscultation bilaterally, no wheezes, rales or rhonchi, good air movement  CV: HRRR, no peripheral edema  MS: moves all extremities without noticeable abnormality  PSYCH: pleasant and cooperative, no obvious depression or anxiety  ASSESSMENT AND PLAN:  Discussed the following assessment and plan:  Persistent atrial fibrillation (HCC)  Obesity  SVT (supraventricular tachycardia) (HCC)  Gastroesophageal reflux disease without esophagitis  Seasonal allergies  Osteoarthritis of multiple joints, unspecified osteoarthritis type  Encounter to establish care with new doctor -We reviewed the PMH, PSH, FH, SH, Meds and Allergies. -We provided refills for any medications we will prescribe as needed. -We addressed current concerns per orders and patient instructions. -We have asked for records for pertinent exams, studies, vaccines and notes from previous providers. -We have advised patient to follow up per instructions below.   -Patient advised to return or notify a doctor immediately if symptoms worsen or persist or new concerns arise.  Patient Instructions  BEFORE YOU LEAVE: -schedule physical exam in 3-4 months  Try flonase for the allergies  We recommend the following healthy lifestyle measures: - eat a healthy whole foods diet consisting of regular small meals composed of vegetables, fruits, beans, nuts, seeds, healthy meats such as white chicken and fish and whole grains.  - avoid sweets, white starchy foods, fried  foods, fast food, processed foods, sodas, red meet and other fattening foods.  - get a least 150-300 minutes of aerobic exercise per week.       Colin Benton R.

## 2015-07-21 ENCOUNTER — Other Ambulatory Visit: Payer: Self-pay | Admitting: Internal Medicine

## 2015-08-21 ENCOUNTER — Ambulatory Visit (HOSPITAL_COMMUNITY)
Admission: RE | Admit: 2015-08-21 | Discharge: 2015-08-21 | Disposition: A | Discharge status: Home / Self Care | Payer: Managed Care, Other (non HMO) | Source: Ambulatory Visit | Attending: Nurse Practitioner | Admitting: Nurse Practitioner

## 2015-08-21 ENCOUNTER — Telehealth (HOSPITAL_COMMUNITY): Payer: Self-pay | Admitting: *Deleted

## 2015-08-21 VITALS — BP 124/76 | HR 152 | Ht 71.0 in | Wt 273.0 lb

## 2015-08-21 DIAGNOSIS — I4819 Other persistent atrial fibrillation: Secondary | ICD-10-CM

## 2015-08-21 DIAGNOSIS — I481 Persistent atrial fibrillation: Secondary | ICD-10-CM | POA: Diagnosis present

## 2015-08-21 LAB — BASIC METABOLIC PANEL
Anion gap: 12 (ref 5–15)
BUN: 13 mg/dL (ref 6–20)
CALCIUM: 9.7 mg/dL (ref 8.9–10.3)
CHLORIDE: 102 mmol/L (ref 101–111)
CO2: 23 mmol/L (ref 22–32)
CREATININE: 0.85 mg/dL (ref 0.44–1.00)
GFR calc Af Amer: >60 mL/min (ref >60)
GFR calc non Af Amer: >60 mL/min (ref >60)
GLUCOSE: 101 mg/dL — AB (ref 65–99)
Potassium: 4.3 mmol/L (ref 3.5–5.1)
Sodium: 137 mmol/L (ref 135–145)

## 2015-08-21 LAB — CBC
HEMATOCRIT: 40.1 % (ref 36.0–46.0)
HEMOGLOBIN: 13.5 g/dL (ref 12.0–15.0)
MCH: 29.2 pg (ref 26.0–34.0)
MCHC: 33.7 g/dL (ref 30.0–36.0)
MCV: 86.6 fL (ref 78.0–100.0)
Platelets: 376 10*3/uL (ref 150–400)
RBC: 4.63 MIL/uL (ref 3.87–5.11)
RDW: 13.5 % (ref 11.5–15.5)
WBC: 8.5 10*3/uL (ref 4.0–10.5)

## 2015-08-21 LAB — MAGNESIUM: MAGNESIUM: 1.9 mg/dL (ref 1.7–2.4)

## 2015-08-21 LAB — TSH: TSH: 2.217 u[IU]/mL (ref 0.350–4.500)

## 2015-08-21 NOTE — Patient Instructions (Addendum)
LINQ placement scheduled for Tuesday, January 10th  - Arrive at the [facility] and go to admitting at 6:30am  -May eat and drink normally; take all medications as normal  Scheduler will be in touch regarding wound check in 7-10 days at [facility] location as well appt with Dr. Rayann Heman within a month.

## 2015-08-21 NOTE — Telephone Encounter (Signed)
Pt called in stating she is having issues with going in/out afib and having dizziness. States she sometimes notices her HR "going low" but doesn't stay consistently. Starting to become worried with this since is becoming more frequent and can occur with driving. She does not see direct correlation between dizziness and changing positions.  Offered pt appt to be assessed may possibly need monitor to establish what exactly HR is doing.  Appt made for 2.

## 2015-08-22 NOTE — Progress Notes (Signed)
Patient ID: Destiny Dennis, female   DOB: October 04, 1960, 55 y.o.   MRN: [id number]           Primary Care Physician: Lucretia Kern., DO Referring Physician: Dr. Rennis Harding is a 55 y.o. female with a h/o persistent afib s/p ablation x 2, 5/13 and 12/13 and currently on tikosyn, here for evaluation of frequent afib and periods of bradycardia. Both episodes leave her feeling very tired and dizzy. She is very frustrated because she wants to exercise, and when her heart rate is abnormal, she feels terrible. She is aware that Dr. Rayann Heman has already made her aware that she may need another ablation. But she is very concerned about the slow heart rate that she has been experiencing.  She had ben doing well maintaining SR until July, when she started having a lot of PAF. This coincided with her drugstore giving her generic tikosyn. After six weeks of having more afib, she requested brand tikosyn. Brand worked well for [employer], but for last several weeks has had frequent breakthrough. She uses extra metoprolol with breakthrough afib but then when converts has issues with bradycardia and fatigue. She presented today with afib with RVR. Usually episodes of RVR last less than than 6-8 hours.  Today, she denies symptoms of palpitations, chest pain, shortness of breath, orthopnea, PND, lower extremity edema, dizziness, presyncope, syncope, or neurologic sequela. The patient is tolerating medications without difficulties and is otherwise without complaint today.   Past Medical History  Diagnosis Date  . Persistent atrial fibrillation ([place]) 03/05/2007    a. s/p RFCA 12/18/2011, 07/29/12  . Atrial flutter ([place])   . SVT (supraventricular tachycardia) ([place]) 03/18/2010    PVCs  . DVT (deep venous thrombosis) ([place])     in pregnancy, s/p hip surgery  . History of MRSA infection 2008    superficial skin-cleared easily with doxycycline  . Allergic rhinitis   . Cervical disc disease   . Benign fundic  gland polyps of stomach   . Diverticulitis     CT Scan   . Hemorrhoids   . Arthritis     OA, s/p numerous surgeries -back, knees, hip  . Anxiety     when tachycardia occurs  . GERD 12/23/2009    diet related-not a problem  . Hiatal hernia     small  . Headache(784.0)     migraines occ.  . Eustachian tube dysfunction   . Sialoadenitis   . Obesity    Past Surgical History  Procedure Laterality Date  . Knee surgery      x 9 Left Knee  . Upper gastrointestinal endoscopy  01/30/2010    hiatal hernia, fundic gland polyps  . Shoulder surgery      Right   . Elbow surgery      Right  . Foot surgery      Right   . Cervical fusion    . Cesarean section      x 2  . Wrist surgery      Left   . Cholecystectomy    . Flexible sigmoidoscopy      1990's  . Colonoscopy  07/16/2011    diverticulosis  . Tee without cardioversion  12/17/2011    Procedure: TRANSESOPHAGEAL ECHOCARDIOGRAM (TEE);  Surgeon: Larey Dresser, MD;  Location: [facility] ENDOSCOPY;  Service: Cardiovascular;  Laterality: N/A;  . Atrial fibrillation and atrial flutter ablation  12/17/11, 07/29/12    PVI and CTI ablations by Dr Rayann Heman  .  Tee without cardioversion  07/28/2012    Procedure: TRANSESOPHAGEAL ECHOCARDIOGRAM (TEE);  Surgeon: Thayer Headings, MD;  Location: [facility] ENDOSCOPY;  Service: Cardiovascular;  Laterality: N/A;  . Atrial fibrillation ablation  07/29/2012    PVI by Dr Rayann Heman (second procedure)  . Total knee arthroplasty Right 12/12/2012    Procedure: RIGHT TOTAL KNEE ARTHROPLASTY;  Surgeon: Mauri Pole, MD;  Location: [facility] ORS;  Service: Orthopedics;  Laterality: Right;  . I&d knee with poly exchange Left 12/12/2012    Procedure: LEFT KNEE EXCISION SAPHENOUS NEUROMA/OPEN SCAR DEBRIDEMENT/POLY EXCHANGE/NERVE EXCISION;  Surgeon: Mauri Pole, MD;  Location: [facility] ORS;  Service: Orthopedics;  Laterality: Left;  . Joint replacement Bilateral     4'14  . Total hip arthroplasty Left 10/17/2013    Procedure: LEFT TOTAL HIP  ARTHROPLASTY ANTERIOR APPROACH;  Surgeon: Mauri Pole, MD;  Location: [facility] ORS;  Service: Orthopedics;  Laterality: Left;  . Atrial fibrillation ablation N/A 12/18/2011    Procedure: ATRIAL FIBRILLATION ABLATION;  Surgeon: [name], MD;  Location: [facility] CATH LAB;  Service: Cardiovascular;  Laterality: N/A;  . Atrial fibrillation ablation N/A 07/29/2012    Procedure: ATRIAL FIBRILLATION ABLATION;  Surgeon: [name], MD;  Location: [facility] CATH LAB;  Service: Cardiovascular;  Laterality: N/A;    Current Outpatient Prescriptions  Medication Sig Dispense Refill  . acetaminophen (TYLENOL) 500 MG tablet Take 1,000 mg by mouth every 6 (six) hours as needed.    . diazepam (VALIUM) 5 MG tablet Take 5 mg by mouth 2 (two) times daily as needed for muscle spasms.     Marland Kitchen estradiol (ESTRACE) 2 MG tablet Take 2 mg by mouth daily.    . metoprolol succinate (TOPROL-XL) 100 MG 24 hr tablet Take 1 tablet (100 mg total) by mouth 2 (two) times daily. 180 tablet 3  . metoprolol tartrate (LOPRESSOR) 25 MG tablet TAKE 1 TO 2 TABLETS BY MOUTH EVERY 6 HOURS AS NEEDED 30 tablet 3  . pantoprazole (PROTONIX) 40 MG tablet TAKE 1 TABLET (40 MG TOTAL) BY MOUTH DAILY AS NEEDED (HEARTBURN). 90 tablet 0  . progesterone (PROMETRIUM) 100 MG capsule Take 100 mg by mouth daily.    . rivaroxaban (XARELTO) 20 MG TABS tablet Take 1 tablet (20 mg total) by mouth every morning. 30 tablet 11  . TIKOSYN 500 MCG capsule Take 1 capsule (500 mcg total) by mouth 2 (two) times daily. 60 capsule 2  . [DISCONTINUED] flecainide (TAMBOCOR) 100 MG tablet Take 1 tablet (100 mg total) by mouth 2 (two) times daily. 60 tablet 12   No current facility-administered medications for this encounter.    Allergies  Allergen Reactions  . Avelox [Moxifloxacin Hcl In Nacl] Other (See Comments)    Numbness & tingling Peripheral neuropathy    Social History   Social History  . Marital Status: Married    Spouse Name: N/A  . Number of Children: 2  . Years  of Education: N/A   Occupational History  . [employer]     Workers Comp   Social History Main Topics  . Smoking status: Never Smoker   . Smokeless tobacco: Never Used  . Alcohol Use: Yes     Comment: rare social  . Drug Use: No  . Sexual Activity: Yes     Comment: has periods q 3-4 months- no possiblity pregnant per pt.07-16-11   Other Topics Concern  . Not on file   Social History Narrative   Updated 06/2015   Daily Caffeine  Pt lives in [place] with spouse and two children 48 and 24 yo in 2-16.  She works as a workers Dance movement psychotherapist.   Christian   No regular exercise, diet ok    Family History  Problem Relation Age of Onset  . Diabetes Maternal Grandfather   . Colon cancer Neg Hx   . Diverticulitis Brother     x 2  . Diverticulitis Paternal Grandmother     ROS- All systems are reviewed and negative except as per the HPI above  Physical Exam: Filed Vitals:   08/21/15 1418  BP: 124/76  Pulse: 152  Height: 5\' 11"  (1.803 m)  Weight: 273 lb (123.832 kg)    GEN- The patient is well appearing, alert and oriented x 3 today.   Head- normocephalic, atraumatic Eyes-  Sclera clear, conjunctiva pink Ears- hearing intact Oropharynx- clear Neck- supple, no JVP Lymph- no cervical lymphadenopathy Lungs- Clear to ausculation bilaterally, normal work of breathing Heart- Rapid irregular rate and rhythm, no murmurs, rubs or gallops, PMI not laterally displaced GI- soft, NT, ND, + BS Extremities- no clubbing, cyanosis, or edema MS- no significant deformity or atrophy Skin- no rash or lesion Psych- euthymic mood, full affect Neuro- strength and sensation are intact  EKG-afib with rvr at 152 bpm, qrs int 86 ms, qtc 365 bpm Epic records reviewed  Assessment and Plan: 1. Afib with more frequent episodes of RVR despite Tikosyn/symptomatic bradycardia  Dr. Rayann Heman had previously suggested she may need a  3rd ablation Pt is very concerned with bradycardia  episodes which make her weak and dizzy Discussed a Linq with her and she would like to pursue this to further understand heart rhythm and symptoms Continue xarelto,tikosyn, metoprolol Bmet/mag, tsh, cbc today  F/u with Dr. Rayann Heman in 4 weeks  Geroge Baseman. [facility]a [facility] [address] [place], [place] [address] [place] Marnisha Stampley, [facility] [address] [place], [place] [address] [phone]

## 2015-08-27 ENCOUNTER — Telehealth (HOSPITAL_COMMUNITY): Payer: Self-pay | Admitting: *Deleted

## 2015-08-27 ENCOUNTER — Ambulatory Visit (HOSPITAL_COMMUNITY)
Admission: RE | Admit: 2015-08-27 | Payer: Managed Care, Other (non HMO) | Source: Ambulatory Visit | Admitting: Internal Medicine

## 2015-08-27 SURGERY — LOOP RECORDER INSERTION

## 2015-08-27 NOTE — Telephone Encounter (Signed)
Pt called in stating due to weather she would not be able to come for linq insertion -- will call back to resch.

## 2015-09-05 ENCOUNTER — Other Ambulatory Visit: Payer: Self-pay | Admitting: Internal Medicine

## 2015-09-10 ENCOUNTER — Ambulatory Visit (HOSPITAL_COMMUNITY)
Admission: RE | Admit: 2015-09-10 | Discharge: 2015-09-10 | Disposition: A | Discharge status: Home / Self Care | Payer: Managed Care, Other (non HMO) | Source: Ambulatory Visit | Attending: Internal Medicine | Admitting: Internal Medicine

## 2015-09-10 ENCOUNTER — Encounter (HOSPITAL_COMMUNITY)
Admission: RE | Disposition: A | Discharge status: Home / Self Care | Payer: Self-pay | Source: Ambulatory Visit | Attending: Internal Medicine

## 2015-09-10 ENCOUNTER — Encounter (HOSPITAL_COMMUNITY): Payer: Self-pay | Admitting: Internal Medicine

## 2015-09-10 DIAGNOSIS — Z86718 Personal history of other venous thrombosis and embolism: Secondary | ICD-10-CM | POA: Diagnosis not present

## 2015-09-10 DIAGNOSIS — F419 Anxiety disorder, unspecified: Secondary | ICD-10-CM | POA: Insufficient documentation

## 2015-09-10 DIAGNOSIS — I4891 Unspecified atrial fibrillation: Secondary | ICD-10-CM | POA: Diagnosis present

## 2015-09-10 DIAGNOSIS — Z6836 Body mass index (BMI) 36.0-36.9, adult: Secondary | ICD-10-CM | POA: Diagnosis not present

## 2015-09-10 DIAGNOSIS — M1990 Unspecified osteoarthritis, unspecified site: Secondary | ICD-10-CM | POA: Insufficient documentation

## 2015-09-10 DIAGNOSIS — Z8614 Personal history of Methicillin resistant Staphylococcus aureus infection: Secondary | ICD-10-CM | POA: Diagnosis not present

## 2015-09-10 DIAGNOSIS — I471 Supraventricular tachycardia: Secondary | ICD-10-CM | POA: Diagnosis not present

## 2015-09-10 DIAGNOSIS — G43909 Migraine, unspecified, not intractable, without status migrainosus: Secondary | ICD-10-CM | POA: Insufficient documentation

## 2015-09-10 DIAGNOSIS — I4892 Unspecified atrial flutter: Secondary | ICD-10-CM | POA: Insufficient documentation

## 2015-09-10 DIAGNOSIS — Z7901 Long term (current) use of anticoagulants: Secondary | ICD-10-CM | POA: Insufficient documentation

## 2015-09-10 DIAGNOSIS — E669 Obesity, unspecified: Secondary | ICD-10-CM | POA: Diagnosis not present

## 2015-09-10 HISTORY — PX: EP IMPLANTABLE DEVICE: SHX172B

## 2015-09-10 SURGERY — LOOP RECORDER INSERTION

## 2015-09-10 MED ORDER — LIDOCAINE-EPINEPHRINE 1 %-1:100000 IJ SOLN
INTRAMUSCULAR | Status: AC
  Filled 2015-09-10: qty 1

## 2015-09-10 MED ORDER — LIDOCAINE-EPINEPHRINE 1 %-1:100000 IJ SOLN
INTRAMUSCULAR | Status: DC | PRN
  Administered 2015-09-10: 20 mL

## 2015-09-10 SURGICAL SUPPLY — 2 items
LOOP REVEAL LINQSYS (Prosthesis & Implant Heart) ×3 IMPLANT
PACK LOOP INSERTION (CUSTOM PROCEDURE TRAY) ×3 IMPLANT

## 2015-09-10 NOTE — Interval H&P Note (Signed)
History and Physical Interval Note:  09/10/2015 7:14 AM  Destiny Dennis  has presented today for surgery, with the diagnosis of afib  The various methods of treatment have been discussed with the patient and family. After consideration of risks, benefits and other options for treatment, the patient has consented to  Procedure(s): Loop Recorder Insertion (N/A) as a surgical intervention .  The patient's history has been reviewed, patient examined, no change in status, stable for surgery.  I have reviewed the patient's chart and labs.  Questions were answered to the patient's satisfaction.     [name]

## 2015-09-10 NOTE — H&P (View-Only) (Signed)
Patient ID: Destiny Dennis, female   DOB: 02/21/1961, 55 y.o.   MRN: [id number]           Primary Care Physician: Lucretia Kern., DO Referring Physician: Dr. Rennis Harding is a 55 y.o. female with a h/o persistent afib s/p ablation x 2, 5/13 and 12/13 and currently on tikosyn, here for evaluation of frequent afib and periods of bradycardia. Both episodes leave her feeling very tired and dizzy. She is very frustrated because she wants to exercise, and when her heart rate is abnormal, she feels terrible. She is aware that Dr. Rayann Heman has already made her aware that she may need another ablation. But she is very concerned about the slow heart rate that she has been experiencing.  She had ben doing well maintaining SR until July, when she started having a lot of PAF. This coincided with her drugstore giving her generic tikosyn. After six weeks of having more afib, she requested brand tikosyn. Brand worked well for [employer], but for last several weeks has had frequent breakthrough. She uses extra metoprolol with breakthrough afib but then when converts has issues with bradycardia and fatigue. She presented today with afib with RVR. Usually episodes of RVR last less than than 6-8 hours.  Today, she denies symptoms of palpitations, chest pain, shortness of breath, orthopnea, PND, lower extremity edema, dizziness, presyncope, syncope, or neurologic sequela. The patient is tolerating medications without difficulties and is otherwise without complaint today.   Past Medical History  Diagnosis Date  . Persistent atrial fibrillation ([place]) 03/05/2007    a. s/p RFCA 12/18/2011, 07/29/12  . Atrial flutter ([place])   . SVT (supraventricular tachycardia) ([place]) 03/18/2010    PVCs  . DVT (deep venous thrombosis) ([place])     in pregnancy, s/p hip surgery  . History of MRSA infection 2008    superficial skin-cleared easily with doxycycline  . Allergic rhinitis   . Cervical disc disease   . Benign fundic  gland polyps of stomach   . Diverticulitis     CT Scan   . Hemorrhoids   . Arthritis     OA, s/p numerous surgeries -back, knees, hip  . Anxiety     when tachycardia occurs  . GERD 12/23/2009    diet related-not a problem  . Hiatal hernia     small  . Headache(784.0)     migraines occ.  . Eustachian tube dysfunction   . Sialoadenitis   . Obesity    Past Surgical History  Procedure Laterality Date  . Knee surgery      x 9 Left Knee  . Upper gastrointestinal endoscopy  01/30/2010    hiatal hernia, fundic gland polyps  . Shoulder surgery      Right   . Elbow surgery      Right  . Foot surgery      Right   . Cervical fusion    . Cesarean section      x 2  . Wrist surgery      Left   . Cholecystectomy    . Flexible sigmoidoscopy      1990's  . Colonoscopy  07/16/2011    diverticulosis  . Tee without cardioversion  12/17/2011    Procedure: TRANSESOPHAGEAL ECHOCARDIOGRAM (TEE);  Surgeon: Larey Dresser, MD;  Location: [facility] ENDOSCOPY;  Service: Cardiovascular;  Laterality: N/A;  . Atrial fibrillation and atrial flutter ablation  12/17/11, 07/29/12    PVI and CTI ablations by Dr Rayann Heman  .  Tee without cardioversion  07/28/2012    Procedure: TRANSESOPHAGEAL ECHOCARDIOGRAM (TEE);  Surgeon: Thayer Headings, MD;  Location: [facility] ENDOSCOPY;  Service: Cardiovascular;  Laterality: N/A;  . Atrial fibrillation ablation  07/29/2012    PVI by Dr Rayann Heman (second procedure)  . Total knee arthroplasty Right 12/12/2012    Procedure: RIGHT TOTAL KNEE ARTHROPLASTY;  Surgeon: Mauri Pole, MD;  Location: [facility] ORS;  Service: Orthopedics;  Laterality: Right;  . I&d knee with poly exchange Left 12/12/2012    Procedure: LEFT KNEE EXCISION SAPHENOUS NEUROMA/OPEN SCAR DEBRIDEMENT/POLY EXCHANGE/NERVE EXCISION;  Surgeon: Mauri Pole, MD;  Location: [facility] ORS;  Service: Orthopedics;  Laterality: Left;  . Joint replacement Bilateral     4'14  . Total hip arthroplasty Left 10/17/2013    Procedure: LEFT TOTAL HIP  ARTHROPLASTY ANTERIOR APPROACH;  Surgeon: Mauri Pole, MD;  Location: [facility] ORS;  Service: Orthopedics;  Laterality: Left;  . Atrial fibrillation ablation N/A 12/18/2011    Procedure: ATRIAL FIBRILLATION ABLATION;  Surgeon: [name], MD;  Location: [facility] CATH LAB;  Service: Cardiovascular;  Laterality: N/A;  . Atrial fibrillation ablation N/A 07/29/2012    Procedure: ATRIAL FIBRILLATION ABLATION;  Surgeon: [name], MD;  Location: [facility] CATH LAB;  Service: Cardiovascular;  Laterality: N/A;    Current Outpatient Prescriptions  Medication Sig Dispense Refill  . acetaminophen (TYLENOL) 500 MG tablet Take 1,000 mg by mouth every 6 (six) hours as needed.    . diazepam (VALIUM) 5 MG tablet Take 5 mg by mouth 2 (two) times daily as needed for muscle spasms.     Marland Kitchen estradiol (ESTRACE) 2 MG tablet Take 2 mg by mouth daily.    . metoprolol succinate (TOPROL-XL) 100 MG 24 hr tablet Take 1 tablet (100 mg total) by mouth 2 (two) times daily. 180 tablet 3  . metoprolol tartrate (LOPRESSOR) 25 MG tablet TAKE 1 TO 2 TABLETS BY MOUTH EVERY 6 HOURS AS NEEDED 30 tablet 3  . pantoprazole (PROTONIX) 40 MG tablet TAKE 1 TABLET (40 MG TOTAL) BY MOUTH DAILY AS NEEDED (HEARTBURN). 90 tablet 0  . progesterone (PROMETRIUM) 100 MG capsule Take 100 mg by mouth daily.    . rivaroxaban (XARELTO) 20 MG TABS tablet Take 1 tablet (20 mg total) by mouth every morning. 30 tablet 11  . TIKOSYN 500 MCG capsule Take 1 capsule (500 mcg total) by mouth 2 (two) times daily. 60 capsule 2  . [DISCONTINUED] flecainide (TAMBOCOR) 100 MG tablet Take 1 tablet (100 mg total) by mouth 2 (two) times daily. 60 tablet 12   No current facility-administered medications for this encounter.    Allergies  Allergen Reactions  . Avelox [Moxifloxacin Hcl In Nacl] Other (See Comments)    Numbness & tingling Peripheral neuropathy    Social History   Social History  . Marital Status: Married    Spouse Name: N/A  . Number of Children: 2  . Years  of Education: N/A   Occupational History  . [employer]     Workers Comp   Social History Main Topics  . Smoking status: Never Smoker   . Smokeless tobacco: Never Used  . Alcohol Use: Yes     Comment: rare social  . Drug Use: No  . Sexual Activity: Yes     Comment: has periods q 3-4 months- no possiblity pregnant per pt.07-16-11   Other Topics Concern  . Not on file   Social History Narrative   Updated 06/2015   Daily Caffeine  Pt lives in [place] with spouse and two children 10 and 12 yo in 2-16.  She works as a workers Dance movement psychotherapist.   Christian   No regular exercise, diet ok    Family History  Problem Relation Age of Onset  . Diabetes Maternal Grandfather   . Colon cancer Neg Hx   . Diverticulitis Brother     x 2  . Diverticulitis Paternal Grandmother     ROS- All systems are reviewed and negative except as per the HPI above  Physical Exam: Filed Vitals:   08/21/15 1418  BP: 124/76  Pulse: 152  Height: 5\' 11"  (1.803 m)  Weight: 273 lb (123.832 kg)    GEN- The patient is well appearing, alert and oriented x 3 today.   Head- normocephalic, atraumatic Eyes-  Sclera clear, conjunctiva pink Ears- hearing intact Oropharynx- clear Neck- supple, no JVP Lymph- no cervical lymphadenopathy Lungs- Clear to ausculation bilaterally, normal work of breathing Heart- Rapid irregular rate and rhythm, no murmurs, rubs or gallops, PMI not laterally displaced GI- soft, NT, ND, + BS Extremities- no clubbing, cyanosis, or edema MS- no significant deformity or atrophy Skin- no rash or lesion Psych- euthymic mood, full affect Neuro- strength and sensation are intact  EKG-afib with rvr at 152 bpm, qrs int 86 ms, qtc 365 bpm Epic records reviewed  Assessment and Plan: 1. Afib with more frequent episodes of RVR despite Tikosyn/symptomatic bradycardia  Dr. Rayann Heman had previously suggested she may need a  3rd ablation Pt is very concerned with bradycardia  episodes which make her weak and dizzy Discussed a Linq with her and she would like to pursue this to further understand heart rhythm and symptoms Continue xarelto,tikosyn, metoprolol Bmet/mag, tsh, cbc today  F/u with Dr. Rayann Heman in 4 weeks  Geroge Baseman. [facility] [address] [place], [place] [address] [place] Rylan Bernard, [facility] [address] [place], [place] [address] [phone]

## 2015-09-23 ENCOUNTER — Encounter: Payer: Self-pay | Admitting: Internal Medicine

## 2015-09-23 ENCOUNTER — Ambulatory Visit (INDEPENDENT_AMBULATORY_CARE_PROVIDER_SITE_OTHER): Payer: Managed Care, Other (non HMO) | Admitting: *Deleted

## 2015-09-23 DIAGNOSIS — I480 Paroxysmal atrial fibrillation: Secondary | ICD-10-CM

## 2015-09-23 LAB — CUP PACEART INCLINIC DEVICE CHECK: MDC IDC SESS DTM: 20170206155644

## 2015-09-23 NOTE — Progress Notes (Signed)
Loop wound check in clinic. Steri-strips previously removed by patient. Wound without redness or edema. Incision edges approximated, wound well healed. Battery status: good. R-waves 0.3mV. No symptom, tachy, or brady episodes. 2 pause episodes, longest ~6 sec, nocturnal, asymptomatic. 19 AF episodes (51.1% burden), +Xarelto. Monthly summary reports and ROV with JA in 3 months.

## 2015-10-10 ENCOUNTER — Encounter: Payer: Managed Care, Other (non HMO) | Admitting: *Deleted

## 2015-10-13 ENCOUNTER — Other Ambulatory Visit: Payer: Self-pay | Admitting: Family Medicine

## 2015-10-20 ENCOUNTER — Encounter: Payer: Self-pay | Admitting: Internal Medicine

## 2015-11-11 ENCOUNTER — Ambulatory Visit (INDEPENDENT_AMBULATORY_CARE_PROVIDER_SITE_OTHER): Payer: Managed Care, Other (non HMO) | Admitting: *Deleted

## 2015-11-11 DIAGNOSIS — I480 Paroxysmal atrial fibrillation: Secondary | ICD-10-CM | POA: Diagnosis not present

## 2015-11-11 NOTE — Progress Notes (Signed)
Carelink Summary Report / Loop Recorder 

## 2015-11-19 ENCOUNTER — Other Ambulatory Visit: Payer: Self-pay | Admitting: Family Medicine

## 2015-12-03 ENCOUNTER — Other Ambulatory Visit: Payer: Self-pay

## 2015-12-03 DIAGNOSIS — Z1231 Encounter for screening mammogram for malignant neoplasm of breast: Secondary | ICD-10-CM

## 2015-12-09 ENCOUNTER — Ambulatory Visit (INDEPENDENT_AMBULATORY_CARE_PROVIDER_SITE_OTHER): Payer: Managed Care, Other (non HMO) | Admitting: Internal Medicine

## 2015-12-09 ENCOUNTER — Encounter: Payer: Self-pay | Admitting: Internal Medicine

## 2015-12-09 ENCOUNTER — Ambulatory Visit (INDEPENDENT_AMBULATORY_CARE_PROVIDER_SITE_OTHER): Payer: Managed Care, Other (non HMO) | Admitting: *Deleted

## 2015-12-09 VITALS — BP 124/82 | HR 110 | Ht 71.0 in | Wt 274.4 lb

## 2015-12-09 DIAGNOSIS — E669 Obesity, unspecified: Secondary | ICD-10-CM

## 2015-12-09 DIAGNOSIS — I480 Paroxysmal atrial fibrillation: Secondary | ICD-10-CM

## 2015-12-09 DIAGNOSIS — I82511 Chronic embolism and thrombosis of right femoral vein: Secondary | ICD-10-CM

## 2015-12-09 LAB — CUP PACEART INCLINIC DEVICE CHECK: MDC IDC SESS DTM: 20170424154229

## 2015-12-09 MED ORDER — METOPROLOL TARTRATE 25 MG PO TABS: 25.0000 mg | ORAL_TABLET | Freq: Two times a day (BID) | ORAL | Status: DC

## 2015-12-09 NOTE — Patient Instructions (Addendum)
Medication Instructions:  Your physician recommends that you continue on your current medications as directed. Please refer to the Current Medication list given to you today.    Labwork: Your physician recommends that you return for lab work on 12/16/15 anytime  You do not have to fast   Testing/Procedures: Your physician has requested that you have an echocardiogram. Echocardiography is a painless test that uses sound waves to create images of your heart. It provides your doctor with information about the size and shape of your heart and how well your heart's chambers and valves are working. This procedure takes approximately one hour. There are no restrictions for this procedure.  Your physician has requested that you have cardiac CT. Cardiac computed tomography (CT) is a painless test that uses an x-ray machine to take clear, detailed pictures of your heart. For further information please visit [facility]. Please follow instruction sheet as given.--- week of 12/23/15   Your physician has recommended that you have an ablation. Catheter ablation is a medical procedure used to treat some cardiac arrhythmias (irregular heartbeats). During catheter ablation, a long, thin, flexible tube is put into a blood vessel in your groin (upper thigh), or neck. This tube is called an ablation catheter. It is then guided to your heart through the blood vessel. Radio frequency waves destroy small areas of heart tissue where abnormal heartbeats may cause an arrhythmia to start. Please see the instruction sheet given to you today.--- 12/31/15   Check in at the [place] [facility] on 12/31/15 at 7:30am Do not eat or drink after midnight the night before your procedure Do not take any medications the morning of your procedure      Follow-Up: Your physician recommends that you schedule a follow-up appointment in: 4 weeks from 12/31/15 with Roderic Palau, NP and 3 months from 5/16/17with Dr  Rayann Heman   Any Other Special Instructions Will Be Listed Below (If Applicable).     If you need a refill on your cardiac medications before your next appointment, please call your pharmacy.

## 2015-12-09 NOTE — Progress Notes (Signed)
Carelink Summary Report / Loop Recorder 

## 2015-12-09 NOTE — Progress Notes (Signed)
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  Unfortunately, her atrial fibrillation has become more persistent.  She has fatigue and decreased exercise tolerance.  She is unable to exercise.  She has frequent palpitations.  Today, she denies symptoms of chest pain, shortness of breath,  lower extremity edema, dizziness, presyncope, or syncope.  The patient is otherwise without complaint today.   Past Medical History  Diagnosis Date  . Persistent atrial fibrillation ([place]) 03/05/2007    a. s/p RFCA 12/18/2011, 07/29/12  . Atrial flutter ([place])   . SVT (supraventricular tachycardia) ([place]) 03/18/2010    PVCs  . DVT (deep venous thrombosis) ([place])     in pregnancy, s/p hip surgery  . History of MRSA infection 2008    superficial skin-cleared easily with doxycycline  . Allergic rhinitis   . Cervical disc disease   . Benign fundic gland polyps of stomach   . Diverticulitis     CT Scan   . Hemorrhoids   . Arthritis     OA, s/p numerous surgeries -back, knees, hip  . Anxiety     when tachycardia occurs  . GERD 12/23/2009    diet related-not a problem  . Hiatal hernia     small  . Headache(784.0)     migraines occ.  . Eustachian tube dysfunction   . Sialoadenitis   . Obesity    Past Surgical History  Procedure Laterality Date  . Knee surgery      x 9 Left Knee  . Upper gastrointestinal endoscopy  01/30/2010    hiatal hernia, fundic gland polyps  . Shoulder surgery      Right   . Elbow surgery      Right  . Foot surgery      Right   . Cervical fusion    . Cesarean section      x 2  . Wrist surgery      Left   . Cholecystectomy    . Flexible sigmoidoscopy      1990's  . Colonoscopy  07/16/2011    diverticulosis  . Tee without cardioversion  12/17/2011    Procedure: TRANSESOPHAGEAL ECHOCARDIOGRAM (TEE);  Surgeon: Larey Dresser, MD;  Location: [facility] ENDOSCOPY;  Service:  Cardiovascular;  Laterality: N/A;  . Atrial fibrillation and atrial flutter ablation  12/17/11, 07/29/12    PVI and CTI ablations by Dr Rayann Heman  . Tee without cardioversion  07/28/2012    Procedure: TRANSESOPHAGEAL ECHOCARDIOGRAM (TEE);  Surgeon: [place] Headings, MD;  Location: [facility] ENDOSCOPY;  Service: Cardiovascular;  Laterality: N/A;  . Atrial fibrillation ablation  07/29/2012    PVI by Dr Rayann Heman (second procedure)  . Total knee arthroplasty Right 12/12/2012    Procedure: RIGHT TOTAL KNEE ARTHROPLASTY;  Surgeon: Mauri Pole, MD;  Location: [facility] ORS;  Service: Orthopedics;  Laterality: Right;  . I&d knee with poly exchange Left 12/12/2012    Procedure: LEFT KNEE EXCISION SAPHENOUS NEUROMA/OPEN SCAR DEBRIDEMENT/POLY EXCHANGE/NERVE EXCISION;  Surgeon: Mauri Pole, MD;  Location: [facility] ORS;  Service: Orthopedics;  Laterality: Left;  . Joint replacement Bilateral     4'14  . Total hip arthroplasty Left 10/17/2013    Procedure: LEFT TOTAL HIP ARTHROPLASTY ANTERIOR APPROACH;  Surgeon: Mauri Pole, MD;  Location: [facility] ORS;  Service: Orthopedics;  Laterality: Left;  . Atrial fibrillation ablation N/A 12/18/2011    Procedure: ATRIAL FIBRILLATION ABLATION;  Surgeon:  [name], MD;  Location: [facility] CATH LAB;  Service: Cardiovascular;  Laterality: N/A;  . Atrial fibrillation ablation N/A 07/29/2012    Procedure: ATRIAL FIBRILLATION ABLATION;  Surgeon: [name], MD;  Location: [facility] CATH LAB;  Service: Cardiovascular;  Laterality: N/A;  . Ep implantable device N/A 09/10/2015    Procedure: Loop Recorder Insertion;  Surgeon: [name], MD;  Location: [place] CV LAB;  Service: Cardiovascular;  Laterality: N/A;    ROS-L ankle pain limits activity, all other systems reviewed and negative except as per above  Current Outpatient Prescriptions  Medication Sig Dispense Refill  . acetaminophen (TYLENOL) 500 MG tablet Take 500-1,000 mg by mouth daily as needed (pain).     . diazepam (VALIUM) 5 MG tablet Take 5  mg by mouth at bedtime.     Marland Kitchen ketotifen (ZADITOR) 0.025 % ophthalmic solution Place 1 drop into both eyes 2 (two) times daily as needed (allergies/ itching).    . methocarbamol (ROBAXIN) 750 MG tablet Take 750 mg by mouth every 12 (twelve) hours. Takes as needed  1  . metoprolol succinate (TOPROL-XL) 100 MG 24 hr tablet Take 1 tablet (100 mg total) by mouth 2 (two) times daily. 180 tablet 3  . metoprolol tartrate (LOPRESSOR) 25 MG tablet Take 1 tablet (25 mg total) by mouth 2 (two) times daily. Pt takes when onset of afib 60 tablet 3  . pantoprazole (PROTONIX) 40 MG tablet TAKE 1 TABLET (40 MG TOTAL) BY MOUTH DAILY AS NEEDED (HEARTBURN). 90 tablet 0  . rivaroxaban (XARELTO) 20 MG TABS tablet Take 1 tablet (20 mg total) by mouth every morning. (Patient taking differently: Take 20 mg by mouth daily with breakfast. ) 30 tablet 11  . TIKOSYN 500 MCG capsule TAKE 1 CAPSULE (500 MCG TOTAL) BY MOUTH 2 (TWO) TIMES DAILY. 60 capsule 5  . [DISCONTINUED] flecainide (TAMBOCOR) 100 MG tablet Take 1 tablet (100 mg total) by mouth 2 (two) times daily. 60 tablet 12   No current facility-administered medications for this visit.    Physical Exam: Filed Vitals:   12/09/15 1239  BP: 124/82  Pulse: 110  Height: 5\' 11"  (1.803 m)  Weight: 274 lb 6.4 oz (124.467 kg)    GEN- The patient is well appearing, alert and oriented x 3 today.   Head- normocephalic, atraumatic Eyes-  Sclera clear, conjunctiva pink Ears- hearing intact Oropharynx- clear Lungs- Clear to ausculation bilaterally, normal work of breathing Heart- irregular rate and rhythm, no murmurs, rubs or gallops, PMI not laterally displaced GI- soft, NT, ND, + BS Extremities- no clubbing, cyanosis, or edema  ekg today reveals afib, V rate 110 bpm, nonspecific St/T changes, QTc 465 msec  ILR is reviewed and reveals AF burden of 78%  Assessment and Plan:  1. Afib She has failed tikosyn.  S/p ablation x 2 in 2013.  Therapeutic strategies for afib  including medicine and ablation were discussed in detail with the patient today. Risk, benefits, and alternatives to repeat EP study and radiofrequency ablation for afib were also discussed in detail today. These risks include but are not limited to stroke, bleeding, vascular damage, tamponade, perforation, damage to the esophagus, lungs, and other structures, pulmonary vein stenosis, worsening renal function, and death. The patient understands these risk and wishes to proceed.  We will therefore proceed with catheter ablation at the next available time.  Will obtain cardiac CT prior to ablation to evaluate for LAA thrombus.  Also obtain echo to evaluate for valvular heart disease.  2.  H/o DVT Continue xarelto long term  3. Overweight Body mass index is 38.29 kg/(m^2). Weight reduction advised  [name] MD, [facility] 12/09/2015 5:55 PM

## 2015-12-12 ENCOUNTER — Telehealth: Payer: Self-pay | Admitting: Internal Medicine

## 2015-12-12 NOTE — Telephone Encounter (Signed)
12-12-15 Lvm to call and schdule Cardiac Ct/saf

## 2015-12-13 ENCOUNTER — Encounter: Payer: Self-pay | Admitting: Internal Medicine

## 2015-12-16 ENCOUNTER — Other Ambulatory Visit: Payer: Managed Care, Other (non HMO)

## 2015-12-24 ENCOUNTER — Ambulatory Visit (HOSPITAL_COMMUNITY)
Admission: RE | Admit: 2015-12-24 | Discharge: 2015-12-24 | Disposition: A | Discharge status: Home / Self Care | Payer: Managed Care, Other (non HMO) | Source: Ambulatory Visit | Attending: Internal Medicine | Admitting: Internal Medicine

## 2015-12-24 ENCOUNTER — Ambulatory Visit (HOSPITAL_BASED_OUTPATIENT_CLINIC_OR_DEPARTMENT_OTHER): Payer: Managed Care, Other (non HMO)

## 2015-12-24 ENCOUNTER — Other Ambulatory Visit: Payer: Self-pay

## 2015-12-24 ENCOUNTER — Other Ambulatory Visit (INDEPENDENT_AMBULATORY_CARE_PROVIDER_SITE_OTHER): Payer: Managed Care, Other (non HMO) | Admitting: *Deleted

## 2015-12-24 ENCOUNTER — Encounter (HOSPITAL_COMMUNITY): Payer: Self-pay

## 2015-12-24 DIAGNOSIS — I480 Paroxysmal atrial fibrillation: Secondary | ICD-10-CM

## 2015-12-24 DIAGNOSIS — J984 Other disorders of lung: Secondary | ICD-10-CM | POA: Diagnosis not present

## 2015-12-24 DIAGNOSIS — K760 Fatty (change of) liver, not elsewhere classified: Secondary | ICD-10-CM | POA: Diagnosis not present

## 2015-12-24 DIAGNOSIS — I517 Cardiomegaly: Secondary | ICD-10-CM | POA: Diagnosis not present

## 2015-12-24 DIAGNOSIS — I4891 Unspecified atrial fibrillation: Secondary | ICD-10-CM | POA: Diagnosis present

## 2015-12-24 DIAGNOSIS — I5189 Other ill-defined heart diseases: Secondary | ICD-10-CM | POA: Insufficient documentation

## 2015-12-24 LAB — CBC WITH DIFFERENTIAL/PLATELET
BASOS ABS: 0 {cells}/uL (ref 0–200)
Basophils Relative: 0 %
EOS PCT: 2 %
Eosinophils Absolute: 142 cells/uL (ref 15–500)
HCT: 37.5 % (ref 35.0–45.0)
Hemoglobin: 12.7 g/dL (ref 11.7–15.5)
Lymphocytes Relative: 27 %
Lymphs Abs: 1917 cells/uL (ref 850–3900)
MCH: 29 pg (ref 27.0–33.0)
MCHC: 33.9 g/dL (ref 32.0–36.0)
MCV: 85.6 fL (ref 80.0–100.0)
MONOS PCT: 7 %
MPV: 9.9 fL (ref 7.5–12.5)
Monocytes Absolute: 497 cells/uL (ref 200–950)
NEUTROS PCT: 64 %
Neutro Abs: 4544 cells/uL (ref 1500–7800)
PLATELETS: 306 10*3/uL (ref 140–400)
RBC: 4.38 MIL/uL (ref 3.80–5.10)
RDW: 13.9 % (ref 11.0–15.0)
WBC: 7.1 10*3/uL (ref 3.8–10.8)

## 2015-12-24 LAB — BASIC METABOLIC PANEL
BUN: 19 mg/dL (ref 7–25)
CALCIUM: 9.2 mg/dL (ref 8.6–10.4)
CO2: 25 mmol/L (ref 20–31)
CREATININE: 0.85 mg/dL (ref 0.50–1.05)
Chloride: 104 mmol/L (ref 98–110)
GLUCOSE: 100 mg/dL — AB (ref 65–99)
Potassium: 4.4 mmol/L (ref 3.5–5.3)
Sodium: 139 mmol/L (ref 135–146)

## 2015-12-24 MED ORDER — IOPAMIDOL (ISOVUE-370) INJECTION 76%
INTRAVENOUS | Status: AC
  Administered 2015-12-24: 80 mL
  Filled 2015-12-24: qty 100

## 2015-12-24 MED ORDER — METOPROLOL TARTRATE 5 MG/5ML IV SOLN
INTRAVENOUS | Status: AC
  Filled 2015-12-24: qty 5

## 2015-12-24 MED ORDER — METOPROLOL TARTRATE 5 MG/5ML IV SOLN
5.0000 mg | Freq: Once | INTRAVENOUS | Status: AC
  Administered 2015-12-24: 5 mg via INTRAVENOUS

## 2015-12-30 ENCOUNTER — Telehealth: Payer: Self-pay | Admitting: Internal Medicine

## 2015-12-30 ENCOUNTER — Telehealth: Payer: Self-pay | Admitting: *Deleted

## 2015-12-30 NOTE — Telephone Encounter (Addendum)
Dr Rayann Heman spoke to patient

## 2015-12-30 NOTE — Telephone Encounter (Signed)
Follow Up   Pt called states that she spoke with Destiny Dennis earlier, states that she would like a call back again to further discuss the possible blood clot that she has in neck. Please call back.

## 2015-12-30 NOTE — Telephone Encounter (Addendum)
Called and spoke with patient in regards to her TEE and cardiac CT.  Dr Rayann Heman reviewed and with the question of thrombus we will cancel her afib ablation for tomorrow and set her up for a TEE.  TEE scheduled for Thurs at 11:00.  Case # [id number].  Patient called and is aware of results and the need to cancel.  She know we will be in touch with her after TEE.

## 2015-12-31 ENCOUNTER — Encounter (HOSPITAL_COMMUNITY): Admission: RE | Payer: Self-pay | Source: Ambulatory Visit

## 2015-12-31 ENCOUNTER — Other Ambulatory Visit: Payer: Self-pay | Admitting: Nurse Practitioner

## 2015-12-31 ENCOUNTER — Ambulatory Visit (HOSPITAL_COMMUNITY)
Admission: RE | Admit: 2015-12-31 | Payer: Managed Care, Other (non HMO) | Source: Ambulatory Visit | Admitting: Internal Medicine

## 2015-12-31 SURGERY — ATRIAL FIBRILLATION ABLATION
Anesthesia: Monitor Anesthesia Care

## 2016-01-01 ENCOUNTER — Ambulatory Visit (HOSPITAL_BASED_OUTPATIENT_CLINIC_OR_DEPARTMENT_OTHER): Payer: Managed Care, Other (non HMO)

## 2016-01-01 ENCOUNTER — Encounter (HOSPITAL_COMMUNITY): Payer: Self-pay | Admitting: *Deleted

## 2016-01-01 ENCOUNTER — Encounter (HOSPITAL_COMMUNITY)
Admission: RE | Disposition: A | Discharge status: Home / Self Care | Payer: Self-pay | Source: Ambulatory Visit | Attending: Cardiology

## 2016-01-01 ENCOUNTER — Ambulatory Visit (HOSPITAL_COMMUNITY)
Admission: RE | Admit: 2016-01-01 | Discharge: 2016-01-01 | Disposition: A | Discharge status: Home / Self Care | Payer: Managed Care, Other (non HMO) | Source: Ambulatory Visit | Attending: Cardiology | Admitting: Cardiology

## 2016-01-01 DIAGNOSIS — K219 Gastro-esophageal reflux disease without esophagitis: Secondary | ICD-10-CM | POA: Insufficient documentation

## 2016-01-01 DIAGNOSIS — Z96642 Presence of left artificial hip joint: Secondary | ICD-10-CM | POA: Diagnosis not present

## 2016-01-01 DIAGNOSIS — I480 Paroxysmal atrial fibrillation: Secondary | ICD-10-CM

## 2016-01-01 DIAGNOSIS — I4891 Unspecified atrial fibrillation: Secondary | ICD-10-CM

## 2016-01-01 DIAGNOSIS — I471 Supraventricular tachycardia: Secondary | ICD-10-CM | POA: Insufficient documentation

## 2016-01-01 DIAGNOSIS — Z79899 Other long term (current) drug therapy: Secondary | ICD-10-CM | POA: Diagnosis not present

## 2016-01-01 DIAGNOSIS — I481 Persistent atrial fibrillation: Secondary | ICD-10-CM | POA: Diagnosis not present

## 2016-01-01 DIAGNOSIS — Z7901 Long term (current) use of anticoagulants: Secondary | ICD-10-CM | POA: Diagnosis not present

## 2016-01-01 DIAGNOSIS — Z6838 Body mass index (BMI) 38.0-38.9, adult: Secondary | ICD-10-CM | POA: Insufficient documentation

## 2016-01-01 DIAGNOSIS — I4819 Other persistent atrial fibrillation: Secondary | ICD-10-CM | POA: Insufficient documentation

## 2016-01-01 DIAGNOSIS — E669 Obesity, unspecified: Secondary | ICD-10-CM | POA: Insufficient documentation

## 2016-01-01 DIAGNOSIS — Z86718 Personal history of other venous thrombosis and embolism: Secondary | ICD-10-CM | POA: Insufficient documentation

## 2016-01-01 DIAGNOSIS — Z96651 Presence of right artificial knee joint: Secondary | ICD-10-CM | POA: Diagnosis not present

## 2016-01-01 DIAGNOSIS — Q211 Atrial septal defect, unspecified: Secondary | ICD-10-CM | POA: Insufficient documentation

## 2016-01-01 DIAGNOSIS — I4892 Unspecified atrial flutter: Secondary | ICD-10-CM | POA: Diagnosis not present

## 2016-01-01 HISTORY — PX: TEE WITHOUT CARDIOVERSION: SHX5443

## 2016-01-01 SURGERY — ECHOCARDIOGRAM, TRANSESOPHAGEAL
Anesthesia: Moderate Sedation

## 2016-01-01 MED ORDER — MIDAZOLAM HCL 10 MG/2ML IJ SOLN
INTRAMUSCULAR | Status: DC | PRN
  Administered 2016-01-01 (×3): 2 mg via INTRAVENOUS

## 2016-01-01 MED ORDER — BUTAMBEN-TETRACAINE-BENZOCAINE 2-2-14 % EX AERO
INHALATION_SPRAY | CUTANEOUS | Status: DC | PRN
  Administered 2016-01-01: 2 via TOPICAL

## 2016-01-01 MED ORDER — MIDAZOLAM HCL 5 MG/ML IJ SOLN
INTRAMUSCULAR | Status: AC
  Filled 2016-01-01: qty 2

## 2016-01-01 MED ORDER — SODIUM CHLORIDE 0.9 % IV SOLN
INTRAVENOUS | Status: DC
  Administered 2016-01-01: 500 mL via INTRAVENOUS

## 2016-01-01 MED ORDER — FENTANYL CITRATE (PF) 100 MCG/2ML IJ SOLN
INTRAMUSCULAR | Status: AC
  Filled 2016-01-01: qty 2

## 2016-01-01 MED ORDER — MIDAZOLAM HCL 5 MG/ML IJ SOLN
INTRAMUSCULAR | Status: AC
  Filled 2016-01-01: qty 1

## 2016-01-01 MED ORDER — FENTANYL CITRATE (PF) 100 MCG/2ML IJ SOLN
INTRAMUSCULAR | Status: DC | PRN
  Administered 2016-01-01: 50 ug via INTRAVENOUS
  Administered 2016-01-01 (×2): 25 ug via INTRAVENOUS

## 2016-01-01 MED ORDER — DIPHENHYDRAMINE HCL 50 MG/ML IJ SOLN
INTRAMUSCULAR | Status: AC
  Filled 2016-01-01: qty 1

## 2016-01-01 MED ORDER — DIPHENHYDRAMINE HCL 50 MG/ML IJ SOLN
INTRAMUSCULAR | Status: DC | PRN
  Administered 2016-01-01: 25 mg via INTRAVENOUS

## 2016-01-01 NOTE — Op Note (Signed)
INDICATIONS: atrial fibrillation  PROCEDURE:   Informed consent was obtained prior to the procedure. The risks, benefits and alternatives for the procedure were discussed and the patient comprehended these risks.  Risks include, but are not limited to, cough, sore throat, vomiting, nausea, somnolence, esophageal and stomach trauma or perforation, bleeding, low blood pressure, aspiration, pneumonia, infection, trauma to the teeth and death.    After a procedural time-out, the oropharynx was anesthetized with 20% benzocaine spray.   During this procedure the patient was administered a total of Versed 10 mg and Fentanyl 100 mcg and diphenhydramine 25 mg IV to achieve and maintain moderate conscious sedation.  The patient's heart rate, blood pressure, and oxygen saturationweare monitored continuously during the procedure. The period of conscious sedation was 21 minutes, of which I was present face-to-face 100% of this time.  The transesophageal probe was inserted in the esophagus and stomach without difficulty and multiple views were obtained.  The patient was kept under observation until the patient left the procedure room.  The patient left the procedure room in stable condition.   Agitated microbubble saline contrast was not administered.  COMPLICATIONS:    There were no immediate complications.  FINDINGS:  No thrombus in the left atrium. 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 Severe biatrial dilation. Mildly reduced LVEF. 40-45% Mild MR.   Time Spent Directly with the Patient:  45 minutes   Lusero Nordlund 01/01/2016, 11:24 AM

## 2016-01-01 NOTE — Progress Notes (Signed)
Echocardiogram Echocardiogram Transesophageal has been performed.  Tresa Res 01/01/2016, 12:30 PM

## 2016-01-01 NOTE — Progress Notes (Deleted)
Pt transported to cath lab directly after TEE in endo procedure room.  Report given to receiving RN.    Britny Riel, RN 

## 2016-01-01 NOTE — H&P (View-Only) (Signed)
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  Unfortunately, her atrial fibrillation has become more persistent.  She has fatigue and decreased exercise tolerance.  She is unable to exercise.  She has frequent palpitations.  Today, she denies symptoms of chest pain, shortness of breath,  lower extremity edema, dizziness, presyncope, or syncope.  The patient is otherwise without complaint today.   Past Medical History  Diagnosis Date  . Persistent atrial fibrillation ([place]) 03/05/2007    a. s/p RFCA 12/18/2011, 07/29/12  . Atrial flutter ([place])   . SVT (supraventricular tachycardia) ([place]) 03/18/2010    PVCs  . DVT (deep venous thrombosis) ([place])     in pregnancy, s/p hip surgery  . History of MRSA infection 2008    superficial skin-cleared easily with doxycycline  . Allergic rhinitis   . Cervical disc disease   . Benign fundic gland polyps of stomach   . Diverticulitis     CT Scan   . Hemorrhoids   . Arthritis     OA, s/p numerous surgeries -back, knees, hip  . Anxiety     when tachycardia occurs  . GERD 12/23/2009    diet related-not a problem  . Hiatal hernia     small  . Headache(784.0)     migraines occ.  . Eustachian tube dysfunction   . Sialoadenitis   . Obesity    Past Surgical History  Procedure Laterality Date  . Knee surgery      x 9 Left Knee  . Upper gastrointestinal endoscopy  01/30/2010    hiatal hernia, fundic gland polyps  . Shoulder surgery      Right   . Elbow surgery      Right  . Foot surgery      Right   . Cervical fusion    . Cesarean section      x 2  . Wrist surgery      Left   . Cholecystectomy    . Flexible sigmoidoscopy      1990's  . Colonoscopy  07/16/2011    diverticulosis  . Tee without cardioversion  12/17/2011    Procedure: TRANSESOPHAGEAL ECHOCARDIOGRAM (TEE);  Surgeon: Larey Dresser, MD;  Location: [facility] ENDOSCOPY;  Service:  Cardiovascular;  Laterality: N/A;  . Atrial fibrillation and atrial flutter ablation  12/17/11, 07/29/12    PVI and CTI ablations by Dr Rayann Heman  . Tee without cardioversion  07/28/2012    Procedure: TRANSESOPHAGEAL ECHOCARDIOGRAM (TEE);  Surgeon: Thayer Headings, MD;  Location: [facility] ENDOSCOPY;  Service: Cardiovascular;  Laterality: N/A;  . Atrial fibrillation ablation  07/29/2012    PVI by Dr Rayann Heman (second procedure)  . Total knee arthroplasty Right 12/12/2012    Procedure: RIGHT TOTAL KNEE ARTHROPLASTY;  Surgeon: Mauri Pole, MD;  Location: [facility] ORS;  Service: Orthopedics;  Laterality: Right;  . I&d knee with poly exchange Left 12/12/2012    Procedure: LEFT KNEE EXCISION SAPHENOUS NEUROMA/OPEN SCAR DEBRIDEMENT/POLY EXCHANGE/NERVE EXCISION;  Surgeon: Mauri Pole, MD;  Location: [facility] ORS;  Service: Orthopedics;  Laterality: Left;  . Joint replacement Bilateral     4'14  . Total hip arthroplasty Left 10/17/2013    Procedure: LEFT TOTAL HIP ARTHROPLASTY ANTERIOR APPROACH;  Surgeon: Mauri Pole, MD;  Location: [facility] ORS;  Service: Orthopedics;  Laterality: Left;  . Atrial fibrillation ablation N/A 12/18/2011    Procedure: ATRIAL FIBRILLATION ABLATION;  Surgeon:  [name], MD;  Location: [facility] CATH LAB;  Service: Cardiovascular;  Laterality: N/A;  . Atrial fibrillation ablation N/A 07/29/2012    Procedure: ATRIAL FIBRILLATION ABLATION;  Surgeon: [name], MD;  Location: [facility] CATH LAB;  Service: Cardiovascular;  Laterality: N/A;  . Ep implantable device N/A 09/10/2015    Procedure: Loop Recorder Insertion;  Surgeon: [name], MD;  Location: [place] CV LAB;  Service: Cardiovascular;  Laterality: N/A;    ROS-L ankle pain limits activity, all other systems reviewed and negative except as per above  Current Outpatient Prescriptions  Medication Sig Dispense Refill  . acetaminophen (TYLENOL) 500 MG tablet Take 500-1,000 mg by mouth daily as needed (pain).     . diazepam (VALIUM) 5 MG tablet Take 5  mg by mouth at bedtime.     Marland Kitchen ketotifen (ZADITOR) 0.025 % ophthalmic solution Place 1 drop into both eyes 2 (two) times daily as needed (allergies/ itching).    . methocarbamol (ROBAXIN) 750 MG tablet Take 750 mg by mouth every 12 (twelve) hours. Takes as needed  1  . metoprolol succinate (TOPROL-XL) 100 MG 24 hr tablet Take 1 tablet (100 mg total) by mouth 2 (two) times daily. 180 tablet 3  . metoprolol tartrate (LOPRESSOR) 25 MG tablet Take 1 tablet (25 mg total) by mouth 2 (two) times daily. Pt takes when onset of afib 60 tablet 3  . pantoprazole (PROTONIX) 40 MG tablet TAKE 1 TABLET (40 MG TOTAL) BY MOUTH DAILY AS NEEDED (HEARTBURN). 90 tablet 0  . rivaroxaban (XARELTO) 20 MG TABS tablet Take 1 tablet (20 mg total) by mouth every morning. (Patient taking differently: Take 20 mg by mouth daily with breakfast. ) 30 tablet 11  . TIKOSYN 500 MCG capsule TAKE 1 CAPSULE (500 MCG TOTAL) BY MOUTH 2 (TWO) TIMES DAILY. 60 capsule 5  . [DISCONTINUED] flecainide (TAMBOCOR) 100 MG tablet Take 1 tablet (100 mg total) by mouth 2 (two) times daily. 60 tablet 12   No current facility-administered medications for this visit.    Physical Exam: Filed Vitals:   12/09/15 1239  BP: 124/82  Pulse: 110  Height: 5\' 11"  (1.803 m)  Weight: 274 lb 6.4 oz (124.467 kg)    GEN- The patient is well appearing, alert and oriented x 3 today.   Head- normocephalic, atraumatic Eyes-  Sclera clear, conjunctiva pink Ears- hearing intact Oropharynx- clear Lungs- Clear to ausculation bilaterally, normal work of breathing Heart- irregular rate and rhythm, no murmurs, rubs or gallops, PMI not laterally displaced GI- soft, NT, ND, + BS Extremities- no clubbing, cyanosis, or edema  ekg today reveals afib, V rate 110 bpm, nonspecific St/T changes, QTc 465 msec  ILR is reviewed and reveals AF burden of 78%  Assessment and Plan:  1. Afib She has failed tikosyn.  S/p ablation x 2 in 2013.  Therapeutic strategies for afib  including medicine and ablation were discussed in detail with the patient today. Risk, benefits, and alternatives to repeat EP study and radiofrequency ablation for afib were also discussed in detail today. These risks include but are not limited to stroke, bleeding, vascular damage, tamponade, perforation, damage to the esophagus, lungs, and other structures, pulmonary vein stenosis, worsening renal function, and death. The patient understands these risk and wishes to proceed.  We will therefore proceed with catheter ablation at the next available time.  Will obtain cardiac CT prior to ablation to evaluate for LAA thrombus.  Also obtain echo to evaluate for valvular heart disease.  2.  H/o DVT Continue xarelto long term  3. Overweight Body mass index is 38.29 kg/(m^2). Weight reduction advised  [name] MD, [facility] 12/09/2015 5:55 PM

## 2016-01-01 NOTE — Interval H&P Note (Signed)
History and Physical Interval Note:  01/01/2016 11:17 AM  Destiny Dennis  has presented today for surgery, with the diagnosis of afib, Abnormal Echo  The various methods of treatment have been discussed with the patient and family. After consideration of risks, benefits and other options for treatment, the patient has consented to  Procedure(s): TRANSESOPHAGEAL ECHOCARDIOGRAM (TEE) (N/A) as a surgical intervention .  The patient's history has been reviewed, patient examined, no change in status, stable for surgery.  I have reviewed the patient's chart and labs.  Questions were answered to the patient's satisfaction.     Kesley Mullens

## 2016-01-01 NOTE — Discharge Instructions (Signed)
Moderate Conscious Sedation, Adult, Care After °Refer to this sheet in the next few weeks. These instructions provide you with information on caring for yourself after your procedure. Your health care provider may also give you more specific instructions. Your treatment has been planned according to current medical practices, but problems sometimes occur. Call your health care provider if you have any problems or questions after your procedure. °WHAT TO EXPECT AFTER THE PROCEDURE  °After your procedure: °· You may feel sleepy, clumsy, and have poor balance for several hours. °· Vomiting may occur if you eat too soon after the procedure. °HOME CARE INSTRUCTIONS °· Do not participate in any activities where you could become injured for at least 24 hours. Do not: °¨ Drive. °¨ Swim. °¨ Ride a bicycle. °¨ Operate heavy machinery. °¨ Cook. °¨ Use power tools. °¨ Climb ladders. °¨ Work from a high place. °· Do not make important decisions or sign legal documents until you are improved. °· If you vomit, drink water, juice, or soup when you can drink without vomiting. Make sure you have little or no nausea before eating solid foods. °· Only take over-the-counter or prescription medicines for pain, discomfort, or fever as directed by your health care provider. °· Make sure you and your family fully understand everything about the medicines given to you, including what side effects may occur. °· You should not drink alcohol, take sleeping pills, or take medicines that cause drowsiness for at least 24 hours. °· If you smoke, do not smoke without supervision. °· If you are feeling better, you may resume normal activities 24 hours after you were sedated. °· Keep all appointments with your health care provider. °SEEK MEDICAL CARE IF: °· Your skin is pale or bluish in color. °· You continue to feel nauseous or vomit. °· Your pain is getting worse and is not helped by medicine. °· You have bleeding or swelling. °· You are still  sleepy or feeling clumsy after 24 hours. °SEEK IMMEDIATE MEDICAL CARE IF: °· You develop a rash. °· You have difficulty breathing. °· You develop any type of allergic problem. °· You have a fever. °MAKE SURE YOU: °· Understand these instructions. °· Will watch your condition. °· Will get help right away if you are not doing well or get worse. °  °This information is not intended to replace advice given to you by your health care provider. Make sure you discuss any questions you have with your health care provider. °  °Document Released: 05/24/2013 Document Revised: 08/24/2014 Document Reviewed: 05/24/2013 °Elsevier Interactive Patient Education ©2016 [employer] ° ° °

## 2016-01-08 ENCOUNTER — Ambulatory Visit: Payer: Managed Care, Other (non HMO)

## 2016-01-08 ENCOUNTER — Ambulatory Visit (INDEPENDENT_AMBULATORY_CARE_PROVIDER_SITE_OTHER): Payer: Managed Care, Other (non HMO) | Admitting: *Deleted

## 2016-01-08 DIAGNOSIS — I480 Paroxysmal atrial fibrillation: Secondary | ICD-10-CM

## 2016-01-08 NOTE — Progress Notes (Signed)
Carelink Summary Report / Loop Recorder 

## 2016-01-11 LAB — CUP PACEART REMOTE DEVICE CHECK: MDC IDC SESS DTM: 20170325120607

## 2016-01-11 NOTE — Progress Notes (Signed)
Carelink summary report received. Battery status OK. Normal device function. No new symptom episodes, brady, or pause episodes. 81% AF, V rates overall well controlled, +Xarelto, +Tikosyn. Monthly summary reports and ROV/PRN

## 2016-01-13 LAB — CUP PACEART REMOTE DEVICE CHECK: MDC IDC SESS DTM: 20170424123626

## 2016-01-13 NOTE — Progress Notes (Signed)
Carelink summary report received. Battery status OK. Normal device function. No new symptom episodes, brady, or pause episodes. 83.5% AF,+Xarelto. Monthly summary reports and ROV/PRN

## 2016-01-15 ENCOUNTER — Encounter: Payer: Self-pay | Admitting: Internal Medicine

## 2016-01-15 ENCOUNTER — Ambulatory Visit: Payer: Managed Care, Other (non HMO)

## 2016-01-15 ENCOUNTER — Ambulatory Visit (INDEPENDENT_AMBULATORY_CARE_PROVIDER_SITE_OTHER): Payer: Managed Care, Other (non HMO) | Admitting: Internal Medicine

## 2016-01-15 VITALS — BP 120/78 | HR 115 | Ht 71.0 in | Wt 271.4 lb

## 2016-01-15 DIAGNOSIS — I82511 Chronic embolism and thrombosis of right femoral vein: Secondary | ICD-10-CM | POA: Diagnosis not present

## 2016-01-15 DIAGNOSIS — I481 Persistent atrial fibrillation: Secondary | ICD-10-CM | POA: Diagnosis not present

## 2016-01-15 DIAGNOSIS — I480 Paroxysmal atrial fibrillation: Secondary | ICD-10-CM

## 2016-01-15 DIAGNOSIS — I4819 Other persistent atrial fibrillation: Secondary | ICD-10-CM

## 2016-01-15 NOTE — Progress Notes (Signed)
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  She is persistently in afib.  Recent TEE revealed no LAA thrombus or significant valvular disease.  Severe LA enlargement noted.  Today, she denies symptoms of chest pain, shortness of breath,  lower extremity edema, dizziness, presyncope, or syncope.  The patient is otherwise without complaint today.   Past Medical History  Diagnosis Date  . Persistent atrial fibrillation ([place]) 03/05/2007    a. s/p RFCA 12/18/2011, 07/29/12  . Atrial flutter ([place])   . SVT (supraventricular tachycardia) ([place]) 03/18/2010    PVCs  . DVT (deep venous thrombosis) ([place])     in pregnancy, s/p hip surgery  . History of MRSA infection 2008    superficial skin-cleared easily with doxycycline  . Allergic rhinitis   . Cervical disc disease   . Benign fundic gland polyps of stomach   . Diverticulitis     CT Scan   . Hemorrhoids   . Arthritis     OA, s/p numerous surgeries -back, knees, hip  . Anxiety     when tachycardia occurs  . GERD 12/23/2009    diet related-not a problem  . Hiatal hernia     small  . Headache(784.0)     migraines occ.  . Eustachian tube dysfunction   . Sialoadenitis   . Obesity    Past Surgical History  Procedure Laterality Date  . Knee surgery      x 9 Left Knee  . Upper gastrointestinal endoscopy  01/30/2010    hiatal hernia, fundic gland polyps  . Shoulder surgery      Right   . Elbow surgery      Right  . Foot surgery      Right   . Cervical fusion    . Cesarean section      x 2  . Wrist surgery      Left   . Cholecystectomy    . Flexible sigmoidoscopy      1990's  . Colonoscopy  07/16/2011    diverticulosis  . Tee without cardioversion  12/17/2011    Procedure: TRANSESOPHAGEAL ECHOCARDIOGRAM (TEE);  Surgeon: Larey Dresser, MD;  Location: [facility] ENDOSCOPY;  Service: Cardiovascular;  Laterality: N/A;  . Atrial  fibrillation and atrial flutter ablation  12/17/11, 07/29/12    PVI and CTI ablations by Dr Rayann Heman  . Tee without cardioversion  07/28/2012    Procedure: TRANSESOPHAGEAL ECHOCARDIOGRAM (TEE);  Surgeon: Thayer Headings, MD;  Location: [facility] ENDOSCOPY;  Service: Cardiovascular;  Laterality: N/A;  . Atrial fibrillation ablation  07/29/2012    PVI by Dr Rayann Heman (second procedure)  . Total knee arthroplasty Right 12/12/2012    Procedure: RIGHT TOTAL KNEE ARTHROPLASTY;  Surgeon: Mauri Pole, MD;  Location: [facility] ORS;  Service: Orthopedics;  Laterality: Right;  . I&d knee with poly exchange Left 12/12/2012    Procedure: LEFT KNEE EXCISION SAPHENOUS NEUROMA/OPEN SCAR DEBRIDEMENT/POLY EXCHANGE/NERVE EXCISION;  Surgeon: Mauri Pole, MD;  Location: [facility] ORS;  Service: Orthopedics;  Laterality: Left;  . Joint replacement Bilateral     4'14  . Total hip arthroplasty Left 10/17/2013    Procedure: LEFT TOTAL HIP ARTHROPLASTY ANTERIOR APPROACH;  Surgeon: Mauri Pole, MD;  Location: [facility] ORS;  Service: Orthopedics;  Laterality: Left;  . Atrial fibrillation ablation N/A 12/18/2011    Procedure: ATRIAL FIBRILLATION ABLATION;  Surgeon: [name], MD;  Location: [facility]  CATH LAB;  Service: Cardiovascular;  Laterality: N/A;  . Atrial fibrillation ablation N/A 07/29/2012    Procedure: ATRIAL FIBRILLATION ABLATION;  Surgeon: [name], MD;  Location: [facility] CATH LAB;  Service: Cardiovascular;  Laterality: N/A;  . Ep implantable device N/A 09/10/2015    Procedure: Loop Recorder Insertion;  Surgeon: [name], MD;  Location: [place] CV LAB;  Service: Cardiovascular;  Laterality: N/A;  . Tee without cardioversion N/A 01/01/2016    Procedure: TRANSESOPHAGEAL ECHOCARDIOGRAM (TEE);  Surgeon: Sanda Klein, MD;  Location: [facility] ENDOSCOPY;  Service: Cardiovascular;  Laterality: N/A;    ROS-L ankle pain limits activity, all other systems reviewed and negative except as per above  Current Outpatient Prescriptions  Medication Sig  Dispense Refill  . acetaminophen (TYLENOL) 500 MG tablet Take 1,000 mg by mouth every 6 (six) hours as needed (For pain.).     Marland Kitchen amoxicillin (AMOXIL) 500 MG capsule Take 2,000 mg by mouth once as needed. Take 1 hour prior to dental appointment.  4  . diazepam (VALIUM) 5 MG tablet Take 5 mg by mouth at bedtime.     Marland Kitchen ketotifen (ZADITOR) 0.025 % ophthalmic solution Place 1 drop into both eyes 2 (two) times daily as needed (allergies/ itching).    . methocarbamol (ROBAXIN) 750 MG tablet Take 750 mg by mouth every 12 (twelve) hours as needed for muscle spasms.   1  . metoprolol succinate (TOPROL-XL) 100 MG 24 hr tablet Take 100 mg by mouth daily. Take with or immediately following a meal.    . metoprolol tartrate (LOPRESSOR) 25 MG tablet Take 25 mg by mouth 2 (two) times daily as needed (AFIB).    . Multiple Vitamin (MULTIVITAMIN WITH MINERALS) TABS tablet Take 1 tablet by mouth daily.    . Omega-3 Fatty Acids (FISH OIL PO) Take 1 capsule by mouth daily.     . pantoprazole (PROTONIX) 40 MG tablet TAKE 1 TABLET (40 MG TOTAL) BY MOUTH DAILY AS NEEDED (HEARTBURN). 90 tablet 0  . rivaroxaban (XARELTO) 20 MG TABS tablet Take 20 mg by mouth at bedtime.    . [DISCONTINUED] flecainide (TAMBOCOR) 100 MG tablet Take 1 tablet (100 mg total) by mouth 2 (two) times daily. 60 tablet 12   No current facility-administered medications for this visit.    Physical Exam: Filed Vitals:   01/15/16 1128  BP: 120/78  Pulse: 115  Height: 5\' 11"  (1.803 m)  Weight: 271 lb 6.4 oz (123.106 kg)    GEN- The patient is well appearing, alert and oriented x 3 today.   Head- normocephalic, atraumatic Eyes-  Sclera clear, conjunctiva pink Ears- hearing intact Oropharynx- clear Lungs- Clear to ausculation bilaterally, normal work of breathing Heart- irregular rate and rhythm, no murmurs, rubs or gallops, PMI not laterally displaced GI- soft, NT, ND, + BS Extremities- no clubbing, cyanosis, or edema  ekg today reveals  afib, V rate 115 bpm, nonspecific St/T changes, QTc 459 msec  ILR is reviewed and reveals AF burden of 78%  Assessment and Plan:  1. Afib She has failed tikosyn.  S/p ablation x 2 in 2013.  Given severe LA enlargement, I would not anticipate additional endocardial ablation to be useful.  She is very young and has refractory afib.  I think that she would do much better long term with sinus rhythm.  I think she is too young for amiodarone long term. I will therefore refer to Dr Roxy Manns for consideration of MAZE operation. Will need cath prior to ablation.  She  would like to have an initial consult with Dr Roxy Manns first.  2. H/o DVT Continue xarelto long term  3. Overweight Body mass index is 37.87 kg/(m^2). Weight reduction advised  4. EF 45% Likely due to afib with RVR Consider cath pending evaluation by Dr Janeece Fitting MD, [facility] 01/15/2016 3:58 PM

## 2016-01-15 NOTE — Patient Instructions (Addendum)
Medication Instructions:  Your physician has recommended you make the following change in your medication:  1) STOP Tikosyn  Labwork: None ordered  Testing/Procedures: None ordered  Follow-Up: You have been referred to Dr. Roxy Manns.  Your physician recommends that you schedule a follow-up appointment in: 8 weeks with Dr. Rayann Heman.  If you need a refill on your cardiac medications before your next appointment, please call your pharmacy.  Thank you for choosing [facility]!!    Any Other Special Instructions Will Be Listed Below (If Applicable).

## 2016-01-16 LAB — CUP PACEART INCLINIC DEVICE CHECK: Date Time Interrogation Session: 20170531153927

## 2016-01-23 ENCOUNTER — Ambulatory Visit: Payer: Managed Care, Other (non HMO)

## 2016-01-24 ENCOUNTER — Telehealth: Payer: Self-pay | Admitting: Cardiology

## 2016-01-24 NOTE — Telephone Encounter (Signed)
LMOVM requesting that pt send manual transmission b/c home monitor has not updated in at least 14 days.    

## 2016-01-27 ENCOUNTER — Encounter: Payer: Self-pay | Admitting: Thoracic Surgery (Cardiothoracic Vascular Surgery)

## 2016-01-27 ENCOUNTER — Other Ambulatory Visit: Payer: Self-pay | Admitting: *Deleted

## 2016-01-27 ENCOUNTER — Institutional Professional Consult (permissible substitution) (INDEPENDENT_AMBULATORY_CARE_PROVIDER_SITE_OTHER): Payer: Managed Care, Other (non HMO) | Admitting: Thoracic Surgery (Cardiothoracic Vascular Surgery)

## 2016-01-27 VITALS — BP 142/97 | HR 84 | Resp 16 | Ht 71.0 in | Wt 266.0 lb

## 2016-01-27 DIAGNOSIS — I481 Persistent atrial fibrillation: Secondary | ICD-10-CM

## 2016-01-27 DIAGNOSIS — Z01818 Encounter for other preprocedural examination: Secondary | ICD-10-CM

## 2016-01-27 DIAGNOSIS — Q211 Atrial septal defect: Secondary | ICD-10-CM | POA: Diagnosis not present

## 2016-01-27 DIAGNOSIS — Q2111 Secundum atrial septal defect: Secondary | ICD-10-CM

## 2016-01-27 DIAGNOSIS — I4819 Other persistent atrial fibrillation: Secondary | ICD-10-CM

## 2016-01-27 NOTE — Patient Instructions (Signed)
Continue all previous medications without any changes at this time  

## 2016-01-27 NOTE — Progress Notes (Signed)
[address]       [place] [address]             [place] REPORT  Referring Provider is [name], MD PCP is Lucretia Kern., DO  Chief Complaint  Patient presents with  . Atrial Fibrillation    PERSISTENT...eval for MAZE    HPI:  Patient is a 55 year old moderately obese female with long-standing history of atrial fibrillation that has failed medical therapy on multiple agents and catheter-based ablation on 2 previous occasions who has been referred for surgical consultation to consider possible maze procedure.  The patient states that she first developed paroxysmal atrial fibrillation in 2008.  She has been followed for several years by Dr. Rayann Heman. She has failed Tikosyn therapy and catheter-based ablation 2 in 2013.  She is not felt to be a candidate for long term amiodarone therapy.  Over the last several years she has experienced continued progression of symptoms of recurrent palpitations, exertional shortness of breath, and fatigue. She has an implantable loop recorder which has documented increasing burden of atrial fibrillation and decreasing amounts of time when she remains in sinus rhythm, most recently with AF burden estimated 78%. 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 She has severe left and right atrial enlargement but no significant valvular heart disease. Transesophageal echocardiogram does reveal a small atrial septal defect which may or may not be related to previous transseptal puncture for catheter-based ablation.  The patient is married and lives locally in [place] with her husband and 2 children. 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 This does not require strenuous exertion but does require some traveling. She has been moderately obese for the past 20  years and unsuccessful with attempts at weight loss. Her physical activity has been limited by problems with severe degenerative arthritis afflicting both knees and her hips. However, in recent months her activity has been limited primarily by exertional shortness of breath. She denies any history of exertional chest pain or chest tightness. She now gets short of breath with moderate level activity. She denies any history of PND, orthopnea, or lower extremity edema. She has frequent palpitations. Her heart rate goes up very quickly with attempts to exercise. The patient has been chronically anticoagulated using Pradaxa for years.  She has not had any complications related to long-term anticoagulation. The remainder of her review of systems is noncontributory.   Past Medical History  Diagnosis Date  . Persistent atrial fibrillation ([place]) 03/05/2007    a. s/p RFCA 12/18/2011, 07/29/12  . Atrial flutter ([place])   . SVT (supraventricular tachycardia) ([place]) 03/18/2010    PVCs  . DVT (deep venous thrombosis) ([place])     in pregnancy, s/p hip surgery  . History of MRSA infection 2008    superficial skin-cleared easily with doxycycline  . Allergic rhinitis   . Cervical disc disease   . Benign fundic gland polyps of stomach   . Diverticulitis     CT Scan   . Hemorrhoids   . Arthritis     OA, s/p numerous surgeries -back, knees, hip  . Anxiety     when tachycardia occurs  . GERD 12/23/2009    diet related-not a problem  . Hiatal hernia     small  . Headache(784.0)     migraines occ.  . Eustachian tube dysfunction   .  Sialoadenitis   . Obesity   . Atrial septal defect 01/01/2016    Discovered on TEE     Past Surgical History  Procedure Laterality Date  . Knee surgery      x 9 Left Knee  . Upper gastrointestinal endoscopy  01/30/2010    hiatal hernia, fundic gland polyps  . Shoulder surgery      Right   . Elbow surgery      Right  . Foot surgery      Right   . Cervical fusion    . Cesarean  section      x 2  . Wrist surgery      Left   . Cholecystectomy    . Flexible sigmoidoscopy      1990's  . Colonoscopy  07/16/2011    diverticulosis  . Tee without cardioversion  12/17/2011    Procedure: TRANSESOPHAGEAL ECHOCARDIOGRAM (TEE);  Surgeon: Larey Dresser, MD;  Location: [facility] ENDOSCOPY;  Service: Cardiovascular;  Laterality: N/A;  . Atrial fibrillation and atrial flutter ablation  12/17/11, 07/29/12    PVI and CTI ablations by Dr Rayann Heman  . Tee without cardioversion  07/28/2012    Procedure: TRANSESOPHAGEAL ECHOCARDIOGRAM (TEE);  Surgeon: Thayer Headings, MD;  Location: [facility] ENDOSCOPY;  Service: Cardiovascular;  Laterality: N/A;  . Atrial fibrillation ablation  07/29/2012    PVI by Dr Rayann Heman (second procedure)  . Total knee arthroplasty Right 12/12/2012    Procedure: RIGHT TOTAL KNEE ARTHROPLASTY;  Surgeon: Mauri Pole, MD;  Location: [facility] ORS;  Service: Orthopedics;  Laterality: Right;  . I&d knee with poly exchange Left 12/12/2012    Procedure: LEFT KNEE EXCISION SAPHENOUS NEUROMA/OPEN SCAR DEBRIDEMENT/POLY EXCHANGE/NERVE EXCISION;  Surgeon: Mauri Pole, MD;  Location: [facility] ORS;  Service: Orthopedics;  Laterality: Left;  . Joint replacement Bilateral     4'14  . Total hip arthroplasty Left 10/17/2013    Procedure: LEFT TOTAL HIP ARTHROPLASTY ANTERIOR APPROACH;  Surgeon: Mauri Pole, MD;  Location: [facility] ORS;  Service: Orthopedics;  Laterality: Left;  . Atrial fibrillation ablation N/A 12/18/2011    Procedure: ATRIAL FIBRILLATION ABLATION;  Surgeon: [name], MD;  Location: [facility] CATH LAB;  Service: Cardiovascular;  Laterality: N/A;  . Atrial fibrillation ablation N/A 07/29/2012    Procedure: ATRIAL FIBRILLATION ABLATION;  Surgeon: [name], MD;  Location: [facility] CATH LAB;  Service: Cardiovascular;  Laterality: N/A;  . Ep implantable device N/A 09/10/2015    Procedure: Loop Recorder Insertion;  Surgeon: [name], MD;  Location: [facility] CV LAB;  Service: Cardiovascular;   Laterality: N/A;  . Tee without cardioversion N/A 01/01/2016    Procedure: TRANSESOPHAGEAL ECHOCARDIOGRAM (TEE);  Surgeon: Sanda Klein, MD;  Location: [facility] ENDOSCOPY;  Service: Cardiovascular;  Laterality: N/A;    Family History  Problem Relation Age of Onset  . Diabetes Maternal Grandfather   . Colon cancer Neg Hx   . Diverticulitis Brother     x 2  . Diverticulitis Paternal Grandmother     Social History   Social History  . Marital Status: Married    Spouse Name: N/A  . Number of Children: 2  . Years of Education: N/A   Occupational History  . [employer]     Workers Comp   Social History Main Topics  . Smoking status: Never Smoker   . Smokeless tobacco: Never Used  . Alcohol Use: Yes     Comment: rare social  . Drug Use: No  . Sexual Activity: Yes  Comment: has periods q 3-4 months- no possiblity pregnant per pt.07-16-11   Other Topics Concern  . Not on file   Social History Narrative   Updated 06/2015   Daily Caffeine    Pt lives in [place] with spouse and two children 93 and 55 yo in 2-16.  She works as a workers Dance movement psychotherapist.   Christian   No regular exercise, diet ok    Current Outpatient Prescriptions  Medication Sig Dispense Refill  . acetaminophen (TYLENOL) 500 MG tablet Take 1,000 mg by mouth every 6 (six) hours as needed (For pain.).     Marland Kitchen amoxicillin (AMOXIL) 500 MG capsule Take 2,000 mg by mouth once as needed. Take 1 hour prior to dental appointment.  4  . diazepam (VALIUM) 5 MG tablet Take 5 mg by mouth at bedtime.     Marland Kitchen ketotifen (ZADITOR) 0.025 % ophthalmic solution Place 1 drop into both eyes 2 (two) times daily as needed (allergies/ itching).    . methocarbamol (ROBAXIN) 750 MG tablet Take 750 mg by mouth every 12 (twelve) hours as needed for muscle spasms.   1  . metoprolol succinate (TOPROL-XL) 100 MG 24 hr tablet Take 100 mg by mouth daily. Take with or immediately following a meal.    . metoprolol tartrate (LOPRESSOR) 25 MG  tablet Take 25 mg by mouth 2 (two) times daily as needed (AFIB).    . Multiple Vitamin (MULTIVITAMIN WITH MINERALS) TABS tablet Take 1 tablet by mouth daily.    . Omega-3 Fatty Acids (FISH OIL PO) Take 1 capsule by mouth daily.     . pantoprazole (PROTONIX) 40 MG tablet TAKE 1 TABLET (40 MG TOTAL) BY MOUTH DAILY AS NEEDED (HEARTBURN). 90 tablet 0  . rivaroxaban (XARELTO) 20 MG TABS tablet Take 20 mg by mouth at bedtime.    . [DISCONTINUED] flecainide (TAMBOCOR) 100 MG tablet Take 1 tablet (100 mg total) by mouth 2 (two) times daily. 60 tablet 12   No current facility-administered medications for this visit.    Allergies  Allergen Reactions  . Avelox [Moxifloxacin Hcl In Nacl] Other (See Comments)    Numbness & tingling Peripheral neuropathy      Review of Systems:   General:  normal appetite, decreased energy, no weight gain, no weight loss, no fever  Cardiac:  no chest pain with exertion, no chest pain at rest, +SOB with exertion, no resting SOB, no PND, no orthopnea, + palpitations, + arrhythmia, + atrial fibrillation, no LE edema, + dizzy spells, no syncope  Respiratory:  no shortness of breath, no home oxygen, no productive cough, no dry cough, no bronchitis, no wheezing, no hemoptysis, no asthma, no pain with inspiration or cough, no sleep apnea, no CPAP at night  GI:   no difficulty swallowing, + reflux, no frequent heartburn, + small hiatal hernia, no abdominal pain, no constipation, no diarrhea, no hematochezia, no hematemesis, no melena  GU:   no dysuria,  no frequency, no urinary tract infection, no hematuria, no kidney stones, no kidney disease  Vascular:  no pain suggestive of claudication, no pain in feet, no leg cramps, no varicose veins, + DVT following hip replacement surgery, no non-healing foot ulcer  Neuro:   no stroke, no TIA's, no seizures, no headaches, no temporary blindness one eye,  no slurred speech, no peripheral neuropathy, no chronic pain, no instability of  gait, no memory/cognitive dysfunction  Musculoskeletal: + arthritis, + joint swelling, no myalgias, + mild difficulty walking, normal mobility  Skin:   no rash, no itching, no skin infections, no pressure sores or ulcerations  Psych:   no anxiety, no depression, no nervousness, no unusual recent stress  Eyes:   no blurry vision, no floaters, no recent vision changes, + wears glasses or contacts  ENT:   no hearing loss, no loose or painful teeth, no dentures  Hematologic:  no easy bruising, no abnormal bleeding, no clotting disorder, no frequent epistaxis  Endocrine:  no diabetes, does not check CBG's at home     Physical Exam:   BP 142/97 mmHg  Pulse 84  Resp 16  Ht 5\' 11"  (1.803 m)  Wt 266 lb (120.657 kg)  BMI 37.12 kg/m2  SpO2 98%  LMP 04/17/2012  General:  Moderately obese,  well-appearing  HEENT:  Unremarkable   Neck:   no JVD, no bruits, no adenopathy   Chest:   clear to auscultation, symmetrical breath sounds, no wheezes, no rhonchi   CV:   Irregular rate and rhythm, no murmur   Abdomen:  soft, non-tender, no masses \  Extremities:  warm, well-perfused, pulses diminished, no LE edema  Rectal/GU  Deferred  Neuro:   Grossly non-focal and symmetrical throughout  Skin:   Clean and dry, no rashes, no breakdown   Diagnostic Tests:  Transthoracic Echocardiography  Patient: Destiny, Dennis MR #: [id number] Study Date: 12/24/2015 Gender: F Age: 72 Height: 180.3 cm Weight: 124.5 kg BSA: 2.55 m^2 Pt. Status: Room:  ATTENDING [name], MD ORDERING [name], MD [place], MD SONOGRAPHER Cindy Hazy, RDCS PERFORMING [facility], Outpatient  cc:  ------------------------------------------------------------------- LV EF: 40% - 45%  ------------------------------------------------------------------- Indications: I48.91 Atrial  Fibrillation.  ------------------------------------------------------------------- History: PMH: Acquired from the patient and from the patient&'s chart. PMH: Persistent Atrial Fibrillation. Atrial Flutter. SVT. DVT.  ------------------------------------------------------------------- Study Conclusions  - Left ventricle: The cavity size was mildly dilated. Wall  thickness was normal. Systolic function was mildly to moderately  reduced. The estimated ejection fraction was in the range of 40%  to 45%. There is hypokinesis of the anteroseptal myocardium. - Left atrium: The atrium was severely dilated.  Impressions:  - Anteroseptal hypokinesis with mild to moderate LV dysfunction;  moderate diastolic dysfunction; severe LAE; oscillating density  in right atrium noted on apical 4 chamber view; suggest TEE to  further assess.  ------------------------------------------------------------------- Labs, prior tests, procedures, and surgery: Loop Recorder. Transthoracic echocardiography. M-mode, complete 2D, spectral Doppler, and color Doppler. Birthdate: Patient birthdate: 27-Sep-1960. Age: Patient is 55 yr old. Sex: Gender: female. BMI: 38.3 kg/m^2. Blood pressure: 124/82 Patient status: Outpatient. Study date: Study date: 12/24/2015. Study time: 08:35 AM. Location: [facility]  -------------------------------------------------------------------  ------------------------------------------------------------------- Left ventricle: The cavity size was mildly dilated. Wall thickness was normal. Systolic function was mildly to moderately reduced. The estimated ejection fraction was in the range of 40% to 45%. Regional wall motion abnormalities: There is hypokinesis of the anteroseptal myocardium. Findings consistent with left ventricular diastolic  dysfunction.  ------------------------------------------------------------------- Aortic valve: Trileaflet; mildly thickened leaflets. Mobility was not restricted. Doppler: Transvalvular velocity was within the normal range. There was no stenosis. There was no regurgitation.  ------------------------------------------------------------------- Aorta: Aortic root: The aortic root was normal in size.  ------------------------------------------------------------------- Mitral valve: Structurally normal valve. Mobility was not restricted. Doppler: Transvalvular velocity was within the normal range. There was no evidence for stenosis. There was trivial regurgitation. Peak gradient (D): 3 mm Hg.  ------------------------------------------------------------------- Left atrium: The atrium was severely dilated.  ------------------------------------------------------------------- Right ventricle: The cavity size  was normal. Systolic function was normal.  ------------------------------------------------------------------- Pulmonic valve: Doppler: Transvalvular velocity was within the normal range. There was no evidence for stenosis. There was mild regurgitation.  ------------------------------------------------------------------- Tricuspid valve: Structurally normal valve. Doppler: Transvalvular velocity was within the normal range. There was trivial regurgitation.  ------------------------------------------------------------------- Right atrium: The atrium was normal in size.  ------------------------------------------------------------------- Pericardium: There was no pericardial effusion.  ------------------------------------------------------------------- Systemic veins: Inferior vena cava: The vessel was normal in size.  ------------------------------------------------------------------- Measurements  Left  ventricle Value Reference LV ID, ED, PLAX chordal (H) 53.9 mm 43 - 52 LV ID, ES, PLAX chordal (H) 40 mm 23 - 38 LV fx shortening, PLAX chordal (L) 26 % >=29 LV PW thickness, ED 9.16 mm --------- IVS/LV PW ratio, ED 1.09 <=1.3 Stroke volume, 2D 92 ml --------- Stroke volume/bsa, 2D 36 ml/m^2 --------- LV e&', lateral 12 cm/s --------- LV E/e&', lateral 7.64 --------- LV e&', medial 9.68 cm/s --------- LV E/e&', medial 9.47 --------- LV e&', average 10.84 cm/s --------- LV E/e&', average 8.46 ---------  Ventricular septum Value Reference IVS thickness, ED 9.96 mm ---------  LVOT Value Reference LVOT ID, S 24 mm --------- LVOT area 4.52 cm^2 --------- LVOT ID 24 mm --------- LVOT peak velocity, S 93.8 cm/s --------- LVOT mean velocity, S 63.5 cm/s --------- LVOT VTI, S 20.3 cm --------- Stroke volume (SV), LVOT DP 91.8 ml --------- Stroke index (SV/bsa), LVOT DP 36.1 ml/m^2 ---------  Aorta Value Reference Aortic root ID, ED 35 mm ---------  Left atrium Value Reference LA ID, A-P, ES 59 mm --------- LA ID/bsa, A-P  (H) 2.32 cm/m^2 <=2.2 LA volume, S 102 ml --------- LA volume/bsa, S 40.1 ml/m^2 --------- LA volume, ES, 1-p A4C 93.6 ml --------- LA volume/bsa, ES, 1-p A4C 36.8 ml/m^2 --------- LA volume, ES, 1-p A2C 108 ml --------- LA volume/bsa, ES, 1-p A2C 42.4 ml/m^2 ---------  Mitral valve Value Reference Mitral E-wave peak velocity 91.7 cm/s --------- Mitral A-wave peak velocity 44.6 cm/s --------- Mitral deceleration time 169 ms 150 - 230 Mitral peak gradient, D 3 mm Hg --------- Mitral E/A ratio, peak 2.1 ---------  Right ventricle Value Reference TAPSE 21.9 mm --------- RV s&', lateral, S 10.4 cm/s ---------  Legend: (L) and (H) mark values outside specified reference range.  ------------------------------------------------------------------- Prepared and Electronically Authenticated by  Kirk Ruths 2017-05-09T09:50:30   Transesophageal Echocardiography  Patient: Destiny Dennis, Destiny Dennis MR #: [id number] Study Date: 01/01/2016 Gender: F Age: 49 Height: 180.3 cm Weight: 124.5 kg BSA: 2.55 m^2 Pt. Status: Room:  ADMITTING Loralie Champagne, M.D. ATTENDING Loralie Champagne, M.D. SONOGRAPHER Tresa Res, RDCS PERFORMING Sanda Klein, MD Emelia Loron, Amber K REFERRING Lynnell Jude, Amber K  cc:  ------------------------------------------------------------------- LV EF: 45% - 50%  ------------------------------------------------------------------- Indications: Atrial fibrillation -  427.31.  ------------------------------------------------------------------- Study Conclusions  - Left ventricle: The cavity size was normal. There was mild  concentric hypertrophy. Systolic function was mildly reduced. The  estimated ejection fraction was in the range of 45% to 50%. - Aortic valve: No evidence of vegetation. - Mitral valve: No evidence of vegetation. - Left atrium: The atrium was severely dilated. No evidence of  thrombus in the atrial cavity or appendage. No spontaneous echo  contrast was observed. - Right atrium: The atrium was severely dilated. - Atrial septum: There was a small (6 mm diameter) high secundum  atrial septal defect. Doppler showed a moderate left-to-right  atrial level shunt, in the baseline state. - Tricuspid valve: No evidence of vegetation. - Pulmonic valve: No evidence of vegetation.  Diagnostic  transesophageal echocardiography. 2D and color Doppler. Birthdate: Patient birthdate: 1961-08-11. Age: Patient is 56 yr old. Sex: Gender: female. BMI: 38.3 kg/m^2. Blood pressure:  130/91 Patient status: Outpatient. Study date: Study date: 01/01/2016. Study time: 11:13 AM. Location: Endoscopy.  -------------------------------------------------------------------  ------------------------------------------------------------------- Left ventricle: The cavity size was normal. There was mild concentric hypertrophy. Systolic function was mildly reduced. The estimated ejection fraction was in the range of 45% to 50%.  ------------------------------------------------------------------- Aortic valve: Structurally normal valve. Cusp separation was normal. No evidence of vegetation. Doppler: There was no regurgitation.  ------------------------------------------------------------------- Aorta: The aorta was normal, not dilated, and  non-diseased.  ------------------------------------------------------------------- Mitral valve: Structurally normal valve. Leaflet separation was normal. No evidence of vegetation. Doppler: There was trivial regurgitation.  ------------------------------------------------------------------- Left atrium: The atrium was severely dilated. No evidence of thrombus in the atrial cavity or appendage. No spontaneous echo contrast was observed. Emptying velocity was normal.  ------------------------------------------------------------------- Atrial septum: There was a small (6 mm diameter) high secundum atrial septal defect. Doppler showed a moderate left-to-right atrial level shunt, in the baseline state.  ------------------------------------------------------------------- Pulmonic valve: Structurally normal valve. Cusp separation was normal. No evidence of vegetation. Doppler: There was mild regurgitation.  ------------------------------------------------------------------- Tricuspid valve: Structurally normal valve. Leaflet separation was normal. No evidence of vegetation. Doppler: There was no regurgitation.  ------------------------------------------------------------------- Right atrium: The atrium was severely dilated. The Eustachian valve appeared very large and prominent. It explains the mobile abnormality seen on transthoracic echo.  ------------------------------------------------------------------- Pericardium: There was no pericardial effusion.  ------------------------------------------------------------------- Post procedure conclusions Ascending Aorta:  - The aorta was normal, not dilated, and non-diseased.  ------------------------------------------------------------------- Measurements  LVOT Value LVOT ID, S 24.2 mm LVOT area 4.6  cm^2 LVOT peak velocity, S 79.67 cm/s LVOT VTI, S 12.13 cm Stroke volume (SV), LVOT DP 55.8 ml Stroke index (SV/bsa), LVOT DP 21.9 ml/m^2  RVOT Value RVOT ID, S 25.5 mm RVOT peak velocity, S 91.53 cm/s RVOT mean velocity, S 62.31 cm/s RVOT VTI, S 16.39 cm RVOT peak gradient, S 4 mm Hg RVOT mean gradient, S 2 mm Hg RVOT stroke volume, DP 83.7 ml Qp:Qs, from RVOT DP 1.5  Legend: (L) and (H) mark values outside specified reference range.  ------------------------------------------------------------------- Prepared and Electronically Authenticated by  Sanda Klein, MD 2017-05-17T18:27:09        Impression:  Patient has a long history of chronic atrial fibrillation dating back to 2008 which initially was primary paroxysmal but more recently has become mostly persistent.  She has failed medical therapy with Tikosyn and is not felt to be a good candidate for long-term amiodarone therapy. She has failed catheter-based ablation on 2 previous occasions in 2013.  She complains of worsening symptoms of palpitations, decreased exercise tolerance, and increased exertional shortness of breath. Transthoracic and transesophageal echocardiograms revealed severe left and right atrial enlargement, moderate left ventricular systolic dysfunction that has been presumed to be secondary to tachycardia mediated cardiomyopathy. She also has a small atrial septal defect.  Options include long-term medical therapy versus another attempt at catheter-based ablation versus surgical Maze procedure.    Plan:  I have discussed the indications, risks, potential benefits of Maze procedure and closure of her atrial septal defect with the patient and her husband in the office today. Alternative  approaches have been discussed at length including continued medical therapy versus additional attempts at catheter-based ablation. The presence of the small atrial septal defect was discussed and the patient's transesophageal echocardiogram was reviewed directly with the patient and her husband. Alternative surgical approaches have  been discussed including those performed with or without the use of cardiopulmonary bypass. The mini thoracotomy approach for maze procedure was discussed.  All of her questions have been addressed. The patient is interested in proceeding with surgery towards the end of the summer. She will need to undergo left and right heart catheterization prior to surgery. We will ask that and oxygen saturation run be performed to definitively characterize whether or not there is any significant physiologic shunt across the patient's atrial septal defect. She will undergo CT angiography to evaluate the feasibility of peripheral arterial cannulation for surgery area the patient will also undergo formal pulmonary function testing. The numerous potential benefits of weight loss have been discussed. The patient will return in 6 weeks to review the results of her cath, CT angiogram and pulmonary function tests.   I spent in excess of 90 minutes during the conduct of this office consultation and >50% of this time involved direct face-to-face encounter with the patient for counseling and/or coordination of their care.   Valentina Gu. Roxy Manns, MD 01/27/2016 10:26 AM

## 2016-02-03 ENCOUNTER — Other Ambulatory Visit: Payer: Managed Care, Other (non HMO)

## 2016-02-04 ENCOUNTER — Inpatient Hospital Stay (HOSPITAL_COMMUNITY)
Admission: RE | Admit: 2016-02-04 | Payer: Managed Care, Other (non HMO) | Source: Ambulatory Visit | Admitting: Nurse Practitioner

## 2016-02-06 ENCOUNTER — Ambulatory Visit
Admission: RE | Admit: 2016-02-06 | Discharge: 2016-02-06 | Disposition: A | Discharge status: Home / Self Care | Payer: Managed Care, Other (non HMO) | Source: Ambulatory Visit

## 2016-02-06 ENCOUNTER — Encounter (HOSPITAL_COMMUNITY): Payer: Managed Care, Other (non HMO)

## 2016-02-06 DIAGNOSIS — Z1231 Encounter for screening mammogram for malignant neoplasm of breast: Secondary | ICD-10-CM

## 2016-02-07 ENCOUNTER — Ambulatory Visit (INDEPENDENT_AMBULATORY_CARE_PROVIDER_SITE_OTHER): Payer: Managed Care, Other (non HMO) | Admitting: *Deleted

## 2016-02-07 DIAGNOSIS — I480 Paroxysmal atrial fibrillation: Secondary | ICD-10-CM

## 2016-02-07 NOTE — Progress Notes (Signed)
Carelink Summary Report / Loop Recorder 

## 2016-02-13 ENCOUNTER — Other Ambulatory Visit: Payer: Self-pay | Admitting: Internal Medicine

## 2016-02-13 LAB — CUP PACEART REMOTE DEVICE CHECK: Date Time Interrogation Session: 20170524123634

## 2016-02-28 LAB — CUP PACEART REMOTE DEVICE CHECK: Date Time Interrogation Session: 20170623130529

## 2016-03-02 ENCOUNTER — Encounter: Payer: Self-pay | Admitting: Internal Medicine

## 2016-03-09 ENCOUNTER — Ambulatory Visit (INDEPENDENT_AMBULATORY_CARE_PROVIDER_SITE_OTHER): Payer: Managed Care, Other (non HMO) | Admitting: *Deleted

## 2016-03-09 DIAGNOSIS — I480 Paroxysmal atrial fibrillation: Secondary | ICD-10-CM

## 2016-03-09 NOTE — Progress Notes (Signed)
Carelink Summary Report / Loop Recorder 

## 2016-03-11 ENCOUNTER — Encounter: Payer: Managed Care, Other (non HMO) | Admitting: Internal Medicine

## 2016-03-12 ENCOUNTER — Other Ambulatory Visit: Payer: Self-pay

## 2016-03-12 MED ORDER — METOPROLOL SUCCINATE ER 100 MG PO TB24: 100.0000 mg | ORAL_TABLET | Freq: Every day | ORAL | 1 refills | Status: DC

## 2016-03-16 ENCOUNTER — Encounter: Payer: Managed Care, Other (non HMO) | Admitting: Thoracic Surgery (Cardiothoracic Vascular Surgery)

## 2016-03-17 ENCOUNTER — Encounter: Payer: Self-pay | Admitting: Internal Medicine

## 2016-03-18 ENCOUNTER — Ambulatory Visit (INDEPENDENT_AMBULATORY_CARE_PROVIDER_SITE_OTHER): Payer: Managed Care, Other (non HMO) | Admitting: Internal Medicine

## 2016-03-18 VITALS — BP 132/88 | HR 116 | Ht 71.0 in | Wt 271.8 lb

## 2016-03-18 DIAGNOSIS — I481 Persistent atrial fibrillation: Secondary | ICD-10-CM | POA: Diagnosis not present

## 2016-03-18 DIAGNOSIS — R0683 Snoring: Secondary | ICD-10-CM | POA: Diagnosis not present

## 2016-03-18 DIAGNOSIS — E669 Obesity, unspecified: Secondary | ICD-10-CM

## 2016-03-18 DIAGNOSIS — I428 Other cardiomyopathies: Secondary | ICD-10-CM

## 2016-03-18 DIAGNOSIS — I429 Cardiomyopathy, unspecified: Secondary | ICD-10-CM

## 2016-03-18 DIAGNOSIS — I82511 Chronic embolism and thrombosis of right femoral vein: Secondary | ICD-10-CM | POA: Diagnosis not present

## 2016-03-18 DIAGNOSIS — I4819 Other persistent atrial fibrillation: Secondary | ICD-10-CM

## 2016-03-18 LAB — CUP PACEART INCLINIC DEVICE CHECK: Date Time Interrogation Session: 20170802100322

## 2016-03-18 NOTE — Patient Instructions (Signed)
Medication Instructions:  Your physician recommends that you continue on your current medications as directed. Please refer to the Current Medication list given to you today.   Labwork: None ordered   Testing/Procedures: Your physician has recommended that you have a sleep study. This test records several body functions during sleep, including: brain activity, eye movement, oxygen and carbon dioxide blood levels, heart rate and rhythm, breathing rate and rhythm, the flow of air through your mouth and nose, snoring, body muscle movements, and chest and belly movement.    Follow-Up: Your physician recommends that you schedule a follow-up appointment in: 3 months with Dr Rayann Heman   Any Other Special Instructions Will Be Listed Below (If Applicable).     If you need a refill on your cardiac medications before your next appointment, please call your pharmacy.

## 2016-03-20 ENCOUNTER — Encounter: Payer: Self-pay | Admitting: Internal Medicine

## 2016-03-22 NOTE — Progress Notes (Signed)
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  She is persistently in afib. She has been evaluated by Dr Roxy Manns and will likely proceed with surgical MAZE soon.  Remains symptomatic with her afib.  Today, she denies symptoms of chest pain, shortness of breath,  lower extremity edema, dizziness, presyncope, or syncope.  The patient is otherwise without complaint today.   Past Medical History:  Diagnosis Date  . Allergic rhinitis   . Anxiety    when tachycardia occurs  . Arthritis    OA, s/p numerous surgeries -back, knees, hip  . Atrial flutter ([place])   . Atrial septal defect 01/01/2016   Discovered on TEE   . Benign fundic gland polyps of stomach   . Cervical disc disease   . Diverticulitis    CT Scan   . DVT (deep venous thrombosis) ([place])    in pregnancy, s/p hip surgery  . Eustachian tube dysfunction   . GERD 12/23/2009   diet related-not a problem  . Headache(784.0)    migraines occ.  Marland Kitchen Hemorrhoids   . Hiatal hernia    small  . History of MRSA infection 2008   superficial skin-cleared easily with doxycycline  . Obesity   . Persistent atrial fibrillation ([place]) 03/05/2007   a. s/p RFCA 12/18/2011, 07/29/12  . Sialoadenitis   . SVT (supraventricular tachycardia) ([place]) 03/18/2010   PVCs   Past Surgical History:  Procedure Laterality Date  . ATRIAL FIBRILLATION ABLATION  07/29/2012   PVI by Dr Rayann Heman (second procedure)  . ATRIAL FIBRILLATION ABLATION N/A 12/18/2011   Procedure: ATRIAL FIBRILLATION ABLATION;  Surgeon: [name], MD;  Location: [facility] CATH LAB;  Service: Cardiovascular;  Laterality: N/A;  . ATRIAL FIBRILLATION ABLATION N/A 07/29/2012   Procedure: ATRIAL FIBRILLATION ABLATION;  Surgeon: [name], MD;  Location: [facility] CATH LAB;  Service: Cardiovascular;  Laterality: N/A;  . atrial fibrillation and atrial flutter ablation  12/17/11, 07/29/12   PVI and CTI ablations  by Dr Rayann Heman  . CERVICAL FUSION    . CESAREAN SECTION     x 2  . CHOLECYSTECTOMY    . COLONOSCOPY  07/16/2011   diverticulosis  . ELBOW SURGERY     Right  . EP IMPLANTABLE DEVICE N/A 09/10/2015   Procedure: Loop Recorder Insertion;  Surgeon: [name], MD;  Location: [place] CV LAB;  Service: Cardiovascular;  Laterality: N/A;  . FLEXIBLE SIGMOIDOSCOPY     1990's  . FOOT SURGERY     Right   . I&D KNEE WITH POLY EXCHANGE Left 12/12/2012   Procedure: LEFT KNEE EXCISION SAPHENOUS NEUROMA/OPEN SCAR DEBRIDEMENT/POLY EXCHANGE/NERVE EXCISION;  Surgeon: Mauri Pole, MD;  [facility]: [facility];  Service: Orthopedics;  Laterality: Left;  . JOINT REPLACEMENT Bilateral    4'14  . KNEE SURGERY     x 9 Left Knee  . SHOULDER SURGERY     Right   . TEE WITHOUT CARDIOVERSION  12/17/2011   Procedure: TRANSESOPHAGEAL ECHOCARDIOGRAM (TEE);  Surgeon: Larey Dresser, MD;  Location: [facility] ENDOSCOPY;  Service: Cardiovascular;  Laterality: N/A;  . TEE WITHOUT CARDIOVERSION  07/28/2012   Procedure: TRANSESOPHAGEAL ECHOCARDIOGRAM (TEE);  Surgeon: Thayer Headings, MD;  Location: [place];  Service: Cardiovascular;  Laterality: N/A;  . TEE WITHOUT CARDIOVERSION N/A 01/01/2016   Procedure: TRANSESOPHAGEAL ECHOCARDIOGRAM (TEE);  Surgeon: Sanda Klein, MD;  Location: [place];  Service: Cardiovascular;  Laterality: N/A;  .  TOTAL HIP ARTHROPLASTY Left 10/17/2013   Procedure: LEFT TOTAL HIP ARTHROPLASTY ANTERIOR APPROACH;  Surgeon: Mauri Pole, MD;  [facility];  Service: Orthopedics;  Laterality: Left;  . TOTAL KNEE ARTHROPLASTY Right 12/12/2012   Procedure: RIGHT TOTAL KNEE ARTHROPLASTY;  Surgeon: Mauri Pole, MD;  [facility];  Service: Orthopedics;  Laterality: Right;  . UPPER GASTROINTESTINAL ENDOSCOPY  01/30/2010   hiatal hernia, fundic gland polyps  . WRIST SURGERY     Left     ROS-L ankle pain limits activity, all other systems reviewed and negative except as per above  Current  Outpatient Prescriptions  Medication Sig Dispense Refill  . acetaminophen (TYLENOL) 500 MG tablet Take 1,000 mg by mouth every 6 (six) hours as needed (For pain.).     Marland Kitchen amoxicillin (AMOXIL) 500 MG capsule Take 2,000 mg by mouth once as needed. Take 1 hour prior to dental appointment.  4  . diazepam (VALIUM) 5 MG tablet Take 5 mg by mouth at bedtime.     Marland Kitchen ketotifen (ZADITOR) 0.025 % ophthalmic solution Place 1 drop into both eyes 2 (two) times daily as needed (allergies/ itching).    . methocarbamol (ROBAXIN) 750 MG tablet Take 750 mg by mouth every 12 (twelve) hours as needed for muscle spasms.   1  . metoprolol succinate (TOPROL-XL) 100 MG 24 hr tablet Take 1 tablet (100 mg total) by mouth daily. Take with or immediately following a meal. 30 tablet 1  . metoprolol tartrate (LOPRESSOR) 25 MG tablet Take 25 mg by mouth 2 (two) times daily as needed (AFIB).    . Multiple Vitamin (MULTIVITAMIN WITH MINERALS) TABS tablet Take 1 tablet by mouth daily.    . Omega-3 Fatty Acids (FISH OIL PO) Take 1 capsule by mouth daily.     . pantoprazole (PROTONIX) 40 MG tablet TAKE 1 TABLET (40 MG TOTAL) BY MOUTH DAILY AS NEEDED (HEARTBURN). 90 tablet 0  . rivaroxaban (XARELTO) 20 MG TABS tablet Take 20 mg by mouth at bedtime.    Alveda Reasons 20 MG TABS tablet TAKE 1 TABLET BY MOUTH AT BEDTIME 30 tablet 11   No current facility-administered medications for this visit.     Physical Exam: Vitals:   03/18/16 0920  BP: 132/88  BP Location: Left Arm  Patient Position: Sitting  Cuff Size: Normal  Pulse: (!) 116  SpO2: 97%  Weight: 271 lb 12.8 oz (123.3 kg)  Height: 5\' 11"  (1.803 m)    GEN- The patient is well appearing, alert and oriented x 3 today.   Head- normocephalic, atraumatic Eyes-  Sclera clear, conjunctiva pink Ears- hearing intact Oropharynx- clear Lungs- Clear to ausculation bilaterally, normal work of breathing Heart- irregular rate and rhythm, no murmurs, rubs or gallops, PMI not laterally  displaced GI- soft, NT, ND, + BS Extremities- no clubbing, cyanosis, or edema  ekg today reveals afib   ILR is reviewed and reveals AF burden of 100%  Assessment and Plan:  1. Afib She has failed tikosyn.  S/p ablation x 2 in 2013.  Given severe LA enlargement, I would not anticipate additional endocardial ablation to be useful.  She is very young and has refractory afib.  I think that she would do much better long term with sinus rhythm.  I think she is too young for amiodarone long term. She will proceed with RHC/LHC and subsequent follow-up with Dr Roxy Manns for MAZE.  2. H/o DVT Continue xarelto long term  3. Overweight Body mass index is  37.91 kg/m. Weight reduction advised  4. EF 45% Likely due to afib with RVR RHC/LHC as per Dr Janeece Fitting MD, [facility] 03/22/2016 10:54 PM

## 2016-03-31 ENCOUNTER — Other Ambulatory Visit: Payer: Self-pay | Admitting: Internal Medicine

## 2016-04-02 ENCOUNTER — Other Ambulatory Visit: Payer: Self-pay | Admitting: Internal Medicine

## 2016-04-02 ENCOUNTER — Telehealth: Payer: Self-pay | Admitting: *Deleted

## 2016-04-02 MED ORDER — METOPROLOL SUCCINATE ER 100 MG PO TB24: 100.0000 mg | ORAL_TABLET | Freq: Two times a day (BID) | ORAL | 3 refills | Status: DC

## 2016-04-02 NOTE — Telephone Encounter (Signed)
Dionicio Stall, RN 4 minutes ago (10:58 AM)    Per patient she is still taking 100mg  of Metoprolol twice daily.  We will correct and send to her pharmacy

## 2016-04-02 NOTE — Telephone Encounter (Signed)
Per patient she is still taking 100mg  of Metoprolol twice daily.  We will correct and send to her pharmacy

## 2016-04-02 NOTE — Telephone Encounter (Signed)
SOME CONFUSION ON METOPROLOL 100 MG DOSAGE, SUPPOSE TO BE TAKING BID, SEE APRIL NOTES, NO NOTES AS TO WHY IT IS NOW QD, PLEASE ADIVSE.   Destiny Dennis  12/09/2015 12:00 PM  Office Visit  MRN:  [id number]  Description: Female DOB: 1960/10/03 Provider: [name], MD Department: [address] [facility]  Vitals   BP  124/82     Pulse    110     Ht  5\' 11"  (1.803 m)     Wt  274 lb 6.4 oz (124.5 kg)     LMP  04/17/2012      BMI  38.27 kg/m    Progress Notes   [name], MD at 12/09/2015 5:55 PM    . metoprolol succinate (TOPROL-XL) 100 MG 24 hr tablet Take 1 tablet (100 mg total) by mouth 2 (two) times daily. 180 tablet 3   NOW SHOWING    metoprolol succinate (TOPROL-XL) 100 MG 24 hr tablet  Medication  Date: 03/12/2016 Department: [address] [facility] Ordering/Authorizing: [name], MD  Order Providers   Prescribing Provider Encounter Provider  [name], MD Velna Ochs, CMA  Medication Detail    Disp Refills Start End   metoprolol succinate (TOPROL-XL) 100 MG 24 hr tablet 30 tablet 1 03/12/2016    Sig - Route: Take 1 tablet (100 mg total) by mouth daily. Take with or immediately following a meal. - Oral   E-Prescribing Status: Receipt confirmed by pharmacy (03/12/2016 8:22 AM EDT)    Order History  Inpatient  Date/Time Action Taken User Additional Information  12/08/13 1630 Sign and Hold Eileen Stanford, [place] ReorderfromOrder:[id number];Reason:RNWillRelease  12/08/13 1631 S&H Release Kristen Edwyna Ready, RN   12/08/13 [place], RN New Order  12/08/13 1642 Rx Verify Manley Mason, [facility] Verify  12/10/13 0801 Modify/Discontinue [name], MD ToOrder:[id number]  12/10/13 0815 [place], RN Discontinue Order  This Order Has Been Discontinued   Order Status By On Reason  Discontinued [name], MD 12/10/13 0801 None      Medication Detail    Disp Refills Start End     metoprolol succinate (TOPROL-XL) 24 hr tablet 100 mg (Discontinued)   12/08/2013 12/10/2013   Sig - Route: Take 1 tablet (100 mg total) by mouth 2 (two) times daily with a meal. - Oral    Order History  Inpatient  Date/Time Action Taken User Additional Information  10/24/13 0808 Sign Troy Sine, MD ModifyfromOrder:[id number]  10/24/13 0809 Rx [place], [facility] Verify  10/24/13 [id number] Acknowledge Leanora Ivanoff, RN New Order  10/24/13 [id number] Order at Discharge [place], [place] ToOrder:[id number]  10/24/13 2010 Discontinue Automatic Discharge Provider Reason: Patient Discharge  This Order Has Been Discontinued   Order Status By On Reason  Discontinued Automatic Discharge Provider 10/24/13 2010 Patient Discharge      Medication Detail    Disp Refills Start End   metoprolol succinate (TOPROL-XL) 24 hr tablet 100 mg (Discontinued)   10/24/2013 10/24/2013   Sig - Route: Take 1 tablet (100 mg total) by mouth 2 (two) times daily. - Oral   Reason for Discontinue: Patient Discharge    Order History  Historical Medication  Date/Time Action Taken User Additional Information  01/15/16 1201 Historical Med Noted Stanton Kidney, RN   01/15/16 [place], RN   01/27/16 0935 Taking [place], RN   03/12/16 0820 Reorder Hulen Shouts  Shirlee Limerick, CMA ToOrder:[id number]  03/12/16 [id number] Discontinue Velna Ochs, CMA Reason: Reorder  This Order Has Been Discontinued   Order Status By On Reason  Discontinued Velna Ochs, [facility] 03/12/16 [id number] Reorder      Medication Detail    Disp Refills Start End   metoprolol succinate (TOPROL-XL) 100 MG 24 hr tablet (Discontinued)    03/12/2016   Sig - Route: Take 100 mg by mouth daily. Take with or immediately following a meal. - Oral   Class: Historical Med   Reason for Discontinue: Reorder

## 2016-04-04 LAB — CUP PACEART REMOTE DEVICE CHECK: Date Time Interrogation Session: 20170723133611

## 2016-04-04 NOTE — Progress Notes (Signed)
Carelink summary report received. Battery status OK. Normal device function. No new symptom episodes, tachy episodes, brady, or pause episodes. 1 AF 100% +Xarelto +Metoprolol. Histograms elevated. Monthly summary reports and ROV/PRN

## 2016-04-06 ENCOUNTER — Ambulatory Visit
Admission: RE | Admit: 2016-04-06 | Discharge: 2016-04-06 | Disposition: A | Discharge status: Home / Self Care | Payer: Managed Care, Other (non HMO) | Source: Ambulatory Visit | Attending: Thoracic Surgery (Cardiothoracic Vascular Surgery) | Admitting: Thoracic Surgery (Cardiothoracic Vascular Surgery)

## 2016-04-06 ENCOUNTER — Ambulatory Visit (HOSPITAL_COMMUNITY)
Admission: RE | Admit: 2016-04-06 | Discharge: 2016-04-06 | Disposition: A | Discharge status: Home / Self Care | Payer: Managed Care, Other (non HMO) | Source: Ambulatory Visit | Attending: Thoracic Surgery (Cardiothoracic Vascular Surgery) | Admitting: Thoracic Surgery (Cardiothoracic Vascular Surgery)

## 2016-04-06 ENCOUNTER — Ambulatory Visit: Payer: Managed Care, Other (non HMO) | Admitting: Internal Medicine

## 2016-04-06 DIAGNOSIS — Z01818 Encounter for other preprocedural examination: Secondary | ICD-10-CM | POA: Diagnosis not present

## 2016-04-06 DIAGNOSIS — I4819 Other persistent atrial fibrillation: Secondary | ICD-10-CM

## 2016-04-06 DIAGNOSIS — I481 Persistent atrial fibrillation: Secondary | ICD-10-CM | POA: Insufficient documentation

## 2016-04-06 LAB — PULMONARY FUNCTION TEST
DL/VA % pred: 96 %
DL/VA: 5.33 ml/min/mmHg/L
DLCO UNC: 30.58 ml/min/mmHg
DLCO unc % pred: 90 %
FEF 25-75 Post: 3.76 L/sec
FEF 25-75 Pre: 3.32 L/sec
FEF2575-%Change-Post: 13 %
FEF2575-%PRED-PRE: 108 %
FEF2575-%Pred-Post: 122 %
FEV1-%Change-Post: 3 %
FEV1-%PRED-POST: 97 %
FEV1-%PRED-PRE: 94 %
FEV1-PRE: 3.24 L
FEV1-Post: 3.35 L
FEV1FVC-%CHANGE-POST: 3 %
FEV1FVC-%Pred-Pre: 101 %
FEV6-%CHANGE-POST: 0 %
FEV6-%PRED-POST: 93 %
FEV6-%PRED-PRE: 94 %
FEV6-POST: 3.99 L
FEV6-PRE: 4 L
FEV6FVC-%PRED-POST: 103 %
FEV6FVC-%PRED-PRE: 103 %
FVC-%Change-Post: 0 %
FVC-%Pred-Post: 91 %
FVC-%Pred-Pre: 91 %
FVC-Post: 3.99 L
FVC-Pre: 4 L
POST FEV6/FVC RATIO: 100 %
Post FEV1/FVC ratio: 84 %
Pre FEV1/FVC ratio: 81 %
Pre FEV6/FVC Ratio: 100 %
RV % pred: 105 %
RV: 2.31 L
TLC % PRED: 103 %
TLC: 6.32 L

## 2016-04-06 MED ORDER — ALBUTEROL SULFATE (2.5 MG/3ML) 0.083% IN NEBU
2.5000 mg | INHALATION_SOLUTION | Freq: Once | RESPIRATORY_TRACT | Status: AC
  Administered 2016-04-06: 2.5 mg via RESPIRATORY_TRACT

## 2016-04-06 MED ORDER — IOPAMIDOL (ISOVUE-370) INJECTION 76%
75.0000 mL | Freq: Once | INTRAVENOUS | Status: AC | PRN
  Administered 2016-04-06: 75 mL via INTRAVENOUS

## 2016-04-07 ENCOUNTER — Ambulatory Visit (INDEPENDENT_AMBULATORY_CARE_PROVIDER_SITE_OTHER): Payer: Managed Care, Other (non HMO) | Admitting: *Deleted

## 2016-04-07 DIAGNOSIS — I480 Paroxysmal atrial fibrillation: Secondary | ICD-10-CM | POA: Diagnosis not present

## 2016-04-07 NOTE — Progress Notes (Signed)
Carelink Summary Report / Loop Recorder 

## 2016-04-08 ENCOUNTER — Telehealth: Payer: Self-pay

## 2016-04-08 NOTE — Telephone Encounter (Signed)
I left the pt a voicemail to make her aware that there was a schedule change in the cath lab tomorrow and Dr Irish Lack will be performing her procedure rather than Dr Burt Knack.  No other changes for procedure.

## 2016-04-08 NOTE — Telephone Encounter (Signed)
Error in typing tomorrow. The cath is scheduled on 04/10/16 which is Friday, arrive at 7:00.

## 2016-04-10 ENCOUNTER — Encounter (HOSPITAL_COMMUNITY): Payer: Self-pay | Admitting: Interventional Cardiology

## 2016-04-10 ENCOUNTER — Ambulatory Visit (HOSPITAL_COMMUNITY)
Admission: RE | Admit: 2016-04-10 | Discharge: 2016-04-10 | Disposition: A | Discharge status: Home / Self Care | Payer: Managed Care, Other (non HMO) | Source: Ambulatory Visit | Attending: Cardiovascular Disease | Admitting: Cardiovascular Disease

## 2016-04-10 ENCOUNTER — Encounter (HOSPITAL_COMMUNITY)
Admission: RE | Disposition: A | Discharge status: Home / Self Care | Payer: Self-pay | Source: Ambulatory Visit | Attending: Cardiovascular Disease

## 2016-04-10 DIAGNOSIS — Z96651 Presence of right artificial knee joint: Secondary | ICD-10-CM | POA: Diagnosis not present

## 2016-04-10 DIAGNOSIS — Z7901 Long term (current) use of anticoagulants: Secondary | ICD-10-CM | POA: Insufficient documentation

## 2016-04-10 DIAGNOSIS — Z86718 Personal history of other venous thrombosis and embolism: Secondary | ICD-10-CM | POA: Diagnosis not present

## 2016-04-10 DIAGNOSIS — I481 Persistent atrial fibrillation: Secondary | ICD-10-CM | POA: Diagnosis not present

## 2016-04-10 DIAGNOSIS — Z8614 Personal history of Methicillin resistant Staphylococcus aureus infection: Secondary | ICD-10-CM | POA: Insufficient documentation

## 2016-04-10 DIAGNOSIS — Z96642 Presence of left artificial hip joint: Secondary | ICD-10-CM | POA: Insufficient documentation

## 2016-04-10 DIAGNOSIS — Z6837 Body mass index (BMI) 37.0-37.9, adult: Secondary | ICD-10-CM | POA: Diagnosis not present

## 2016-04-10 DIAGNOSIS — I429 Cardiomyopathy, unspecified: Secondary | ICD-10-CM | POA: Diagnosis not present

## 2016-04-10 DIAGNOSIS — E663 Overweight: Secondary | ICD-10-CM | POA: Insufficient documentation

## 2016-04-10 DIAGNOSIS — I428 Other cardiomyopathies: Secondary | ICD-10-CM

## 2016-04-10 HISTORY — PX: CARDIAC CATHETERIZATION: SHX172

## 2016-04-10 LAB — CBC
HEMATOCRIT: 36.6 % (ref 36.0–46.0)
HEMOGLOBIN: 12.1 g/dL (ref 12.0–15.0)
MCH: 28.7 pg (ref 26.0–34.0)
MCHC: 33.1 g/dL (ref 30.0–36.0)
MCV: 86.9 fL (ref 78.0–100.0)
Platelets: 277 10*3/uL (ref 150–400)
RBC: 4.21 MIL/uL (ref 3.87–5.11)
RDW: 13.8 % (ref 11.5–15.5)
WBC: 7.6 10*3/uL (ref 4.0–10.5)

## 2016-04-10 LAB — POCT I-STAT 3, VENOUS BLOOD GAS (G3P V)
ACID-BASE DEFICIT: 1 mmol/L (ref 0.0–2.0)
Acid-base deficit: 2 mmol/L (ref 0.0–2.0)
Bicarbonate: 23.2 mEq/L (ref 20.0–24.0)
Bicarbonate: 24.9 mEq/L — ABNORMAL HIGH (ref 20.0–24.0)
O2 Saturation: 68 %
O2 Saturation: 71 %
PH VEN: 7.359 — AB (ref 7.250–7.300)
PH VEN: 7.363 — AB (ref 7.250–7.300)
TCO2: 24 mmol/L (ref 0–100)
TCO2: 26 mmol/L (ref 0–100)
pCO2, Ven: 40.7 mmHg — ABNORMAL LOW (ref 45.0–50.0)
pCO2, Ven: 44.1 mmHg — ABNORMAL LOW (ref 45.0–50.0)
pO2, Ven: 37 mmHg (ref 31.0–45.0)
pO2, Ven: 38 mmHg (ref 31.0–45.0)

## 2016-04-10 LAB — POCT I-STAT 3, ART BLOOD GAS (G3+)
ACID-BASE DEFICIT: 3 mmol/L — AB (ref 0.0–2.0)
Bicarbonate: 22.1 mEq/L (ref 20.0–24.0)
O2 SAT: 94 %
PCO2 ART: 36.7 mmHg (ref 35.0–45.0)
TCO2: 23 mmol/L (ref 0–100)
pH, Arterial: 7.387 (ref 7.350–7.450)
pO2, Arterial: 72 mmHg — ABNORMAL LOW (ref 80.0–100.0)

## 2016-04-10 LAB — BASIC METABOLIC PANEL
ANION GAP: 11 (ref 5–15)
BUN: 15 mg/dL (ref 6–20)
CALCIUM: 9.3 mg/dL (ref 8.9–10.3)
CO2: 23 mmol/L (ref 22–32)
Chloride: 104 mmol/L (ref 101–111)
Creatinine, Ser: 0.86 mg/dL (ref 0.44–1.00)
Glucose, Bld: 104 mg/dL — ABNORMAL HIGH (ref 65–99)
POTASSIUM: 4.2 mmol/L (ref 3.5–5.1)
SODIUM: 138 mmol/L (ref 135–145)

## 2016-04-10 SURGERY — RIGHT/LEFT HEART CATH AND CORONARY ANGIOGRAPHY
Anesthesia: LOCAL

## 2016-04-10 MED ORDER — MIDAZOLAM HCL 2 MG/2ML IJ SOLN
INTRAMUSCULAR | Status: DC | PRN
  Administered 2016-04-10: 1 mg via INTRAVENOUS
  Administered 2016-04-10: 2 mg via INTRAVENOUS

## 2016-04-10 MED ORDER — ASPIRIN 81 MG PO CHEW
CHEWABLE_TABLET | ORAL | Status: AC
  Administered 2016-04-10: 81 mg via ORAL
  Filled 2016-04-10: qty 1

## 2016-04-10 MED ORDER — MIDAZOLAM HCL 2 MG/2ML IJ SOLN
INTRAMUSCULAR | Status: AC
  Filled 2016-04-10: qty 2

## 2016-04-10 MED ORDER — ACETAMINOPHEN 325 MG PO TABS
ORAL_TABLET | ORAL | Status: AC
  Filled 2016-04-10: qty 2

## 2016-04-10 MED ORDER — SODIUM CHLORIDE 0.9 % IV SOLN: 250.0000 mL | INTRAVENOUS | Status: DC | PRN

## 2016-04-10 MED ORDER — IOPAMIDOL (ISOVUE-370) INJECTION 76%
INTRAVENOUS | Status: DC | PRN
  Administered 2016-04-10: 100 mL via INTRA_ARTERIAL

## 2016-04-10 MED ORDER — HEPARIN (PORCINE) IN NACL 2-0.9 UNIT/ML-% IJ SOLN
INTRAMUSCULAR | Status: DC | PRN
Start: 2016-04-10 — End: 2016-04-10
  Administered 2016-04-10: 1000 mL

## 2016-04-10 MED ORDER — RIVAROXABAN 20 MG PO TABS: 20.0000 mg | ORAL_TABLET | Freq: Every day | ORAL | Status: DC

## 2016-04-10 MED ORDER — ACETAMINOPHEN 325 MG PO TABS
650.0000 mg | ORAL_TABLET | ORAL | Status: DC | PRN
  Administered 2016-04-10: 650 mg via ORAL

## 2016-04-10 MED ORDER — FENTANYL CITRATE (PF) 100 MCG/2ML IJ SOLN
INTRAMUSCULAR | Status: AC
  Filled 2016-04-10: qty 2

## 2016-04-10 MED ORDER — SODIUM CHLORIDE 0.9% FLUSH: 3.0000 mL | INTRAVENOUS | Status: DC | PRN

## 2016-04-10 MED ORDER — HEPARIN (PORCINE) IN NACL 2-0.9 UNIT/ML-% IJ SOLN
INTRAMUSCULAR | Status: DC | PRN
  Administered 2016-04-10: 8 mL via INTRA_ARTERIAL

## 2016-04-10 MED ORDER — HEPARIN (PORCINE) IN NACL 2-0.9 UNIT/ML-% IJ SOLN
INTRAMUSCULAR | Status: AC
Start: 2016-04-10 — End: 2016-04-10
  Filled 2016-04-10: qty 1000

## 2016-04-10 MED ORDER — LIDOCAINE HCL (PF) 1 % IJ SOLN
INTRAMUSCULAR | Status: DC | PRN
  Administered 2016-04-10: 3 mL
  Administered 2016-04-10: 2 mL

## 2016-04-10 MED ORDER — SODIUM CHLORIDE 0.9% FLUSH: 3.0000 mL | Freq: Two times a day (BID) | INTRAVENOUS | Status: DC

## 2016-04-10 MED ORDER — ASPIRIN 81 MG PO CHEW
81.0000 mg | CHEWABLE_TABLET | ORAL | Status: AC
  Administered 2016-04-10: 81 mg via ORAL

## 2016-04-10 MED ORDER — HEPARIN SODIUM (PORCINE) 1000 UNIT/ML IJ SOLN
INTRAMUSCULAR | Status: DC | PRN
  Administered 2016-04-10: 6000 [IU] via INTRAVENOUS

## 2016-04-10 MED ORDER — SODIUM CHLORIDE 0.9 % WEIGHT BASED INFUSION: 1.0000 mL/kg/h | INTRAVENOUS | Status: DC

## 2016-04-10 MED ORDER — FENTANYL CITRATE (PF) 100 MCG/2ML IJ SOLN
INTRAMUSCULAR | Status: DC | PRN
  Administered 2016-04-10 (×2): 25 ug via INTRAVENOUS

## 2016-04-10 MED ORDER — HEPARIN SODIUM (PORCINE) 1000 UNIT/ML IJ SOLN
INTRAMUSCULAR | Status: AC
Start: 2016-04-10 — End: 2016-04-10
  Filled 2016-04-10: qty 1

## 2016-04-10 MED ORDER — LIDOCAINE HCL (PF) 1 % IJ SOLN
INTRAMUSCULAR | Status: AC
Start: 2016-04-10 — End: 2016-04-10
  Filled 2016-04-10: qty 30

## 2016-04-10 MED ORDER — IOPAMIDOL (ISOVUE-370) INJECTION 76%
INTRAVENOUS | Status: AC
  Filled 2016-04-10: qty 100

## 2016-04-10 MED ORDER — SODIUM CHLORIDE 0.9 % WEIGHT BASED INFUSION
3.0000 mL/kg/h | INTRAVENOUS | Status: AC
  Administered 2016-04-10: 3 mL/kg/h via INTRAVENOUS

## 2016-04-10 SURGICAL SUPPLY — 13 items
CATH BALLN WEDGE 5F 110CM (CATHETERS) ×1 IMPLANT
CATH INFINITI 5 FR JL3.5 (CATHETERS) ×1 IMPLANT
CATH INFINITI 5FR ANG PIGTAIL (CATHETERS) ×1 IMPLANT
CATH INFINITI JR4 5F (CATHETERS) ×1 IMPLANT
DEVICE RAD COMP TR BAND LRG (VASCULAR PRODUCTS) ×1 IMPLANT
GLIDESHEATH SLEND SS 6F .021 (SHEATH) ×1 IMPLANT
KIT HEART LEFT (KITS) ×2 IMPLANT
PACK CARDIAC CATHETERIZATION (CUSTOM PROCEDURE TRAY) ×2 IMPLANT
SHEATH FAST CATH BRACH 5F 5CM (SHEATH) ×1 IMPLANT
SYR MEDRAD MARK V 150ML (SYRINGE) ×2 IMPLANT
TRANSDUCER W/STOPCOCK (MISCELLANEOUS) ×3 IMPLANT
TUBING CIL FLEX 10 FLL-RA (TUBING) ×2 IMPLANT
WIRE SAFE-T 1.5MM-J .035X260CM (WIRE) ×1 IMPLANT

## 2016-04-10 NOTE — Progress Notes (Signed)
Pt c/o left eye blurred vision. States she gets migraine HA 2 -3 x year. Dr Irish Lack was called and orders followed.

## 2016-04-10 NOTE — Discharge Instructions (Signed)
Radial Site Care °Refer to this sheet in the next few weeks. These instructions provide you with information about caring for yourself after your procedure. Your health care provider may also give you more specific instructions. Your treatment has been planned according to current medical practices, but problems sometimes occur. Call your health care provider if you have any problems or questions after your procedure. °WHAT TO EXPECT AFTER THE PROCEDURE °After your procedure, it is typical to have the following: °· Bruising at the radial site that usually fades within 1-2 weeks. °· Blood collecting in the tissue (hematoma) that may be painful to the touch. It should usually decrease in size and tenderness within 1-2 weeks. °HOME CARE INSTRUCTIONS °· Take medicines only as directed by your health care provider. °· You may shower 24-48 hours after the procedure or as directed by your health care provider. Remove the bandage (dressing) and gently wash the site with plain soap and water. Pat the area dry with a clean towel. Do not rub the site, because this may cause bleeding. °· Do not take baths, swim, or use a hot tub until your health care provider approves. °· Check your insertion site every day for redness, swelling, or drainage. °· Do not apply powder or lotion to the site. °· Do not flex or bend the affected arm for 24 hours or as directed by your health care provider. °· Do not push or pull heavy objects with the affected arm for 24 hours or as directed by your health care provider. °· Do not lift over 10 lb (4.5 kg) for 5 days after your procedure or as directed by your health care provider. °· Ask your health care provider when it is okay to: °¨ Return to work or school. °¨ Resume usual physical activities or sports. °¨ Resume sexual activity. °· Do not drive home if you are discharged the same day as the procedure. Have someone else drive you. °· You may drive 24 hours after the procedure unless otherwise  instructed by your health care provider. °· Do not operate machinery or power tools for 24 hours after the procedure. °· If your procedure was done as an outpatient procedure, which means that you went home the same day as your procedure, a responsible adult should be with you for the first 24 hours after you arrive home. °· Keep all follow-up visits as directed by your health care provider. This is important. °SEEK MEDICAL CARE IF: °· You have a fever. °· You have chills. °· You have increased bleeding from the radial site. Hold pressure on the site. CALL 911 °SEEK IMMEDIATE MEDICAL CARE IF: °· You have unusual pain at the radial site. °· You have redness, warmth, or swelling at the radial site. °· You have drainage (other than a small amount of blood on the dressing) from the radial site. °· The radial site is bleeding, and the bleeding does not stop after 30 minutes of holding steady pressure on the site. °· Your arm or hand becomes pale, cool, tingly, or numb. °  °This information is not intended to replace advice given to you by your health care provider. Make sure you discuss any questions you have with your health care provider. °  °Document Released: 09/05/2010 Document Revised: 08/24/2014 Document Reviewed: 02/19/2014 °Elsevier Interactive Patient Education ©2016 [employer] Inc[employer] ° °

## 2016-04-10 NOTE — H&P (View-Only) (Signed)
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  She is persistently in afib. She has been evaluated by Dr Roxy Manns and will likely proceed with surgical MAZE soon.  Remains symptomatic with her afib.  Today, she denies symptoms of chest pain, shortness of breath,  lower extremity edema, dizziness, presyncope, or syncope.  The patient is otherwise without complaint today.   Past Medical History:  Diagnosis Date  . Allergic rhinitis   . Anxiety    when tachycardia occurs  . Arthritis    OA, s/p numerous surgeries -back, knees, hip  . Atrial flutter ([place])   . Atrial septal defect 01/01/2016   Discovered on TEE   . Benign fundic gland polyps of stomach   . Cervical disc disease   . Diverticulitis    CT Scan   . DVT (deep venous thrombosis) ([place])    in pregnancy, s/p hip surgery  . Eustachian tube dysfunction   . GERD 12/23/2009   diet related-not a problem  . Headache(784.0)    migraines occ.  Marland Kitchen Hemorrhoids   . Hiatal hernia    small  . History of MRSA infection 2008   superficial skin-cleared easily with doxycycline  . Obesity   . Persistent atrial fibrillation ([place]) 03/05/2007   a. s/p RFCA 12/18/2011, 07/29/12  . Sialoadenitis   . SVT (supraventricular tachycardia) ([place]) 03/18/2010   PVCs   Past Surgical History:  Procedure Laterality Date  . ATRIAL FIBRILLATION ABLATION  07/29/2012   PVI by Dr Rayann Heman (second procedure)  . ATRIAL FIBRILLATION ABLATION N/A 12/18/2011   Procedure: ATRIAL FIBRILLATION ABLATION;  Surgeon: [name], MD;  Location[facility] CATH LAB;  Service: Cardiovascular;  Laterality: N/A;  . ATRIAL FIBRILLATION ABLATION N/A 07/29/2012   Procedure: ATRIAL FIBRILLATION ABLATION;  Surgeon: [name], MD;  Location: [facility] CATH LAB;  Service: Cardiovascular;  Laterality: N/A;  . atrial fibrillation and atrial flutter ablation  12/17/11, 07/29/12   PVI and CTI ablations  by Dr Rayann Heman  . CERVICAL FUSION    . CESAREAN SECTION     x 2  . CHOLECYSTECTOMY    . COLONOSCOPY  07/16/2011   diverticulosis  . ELBOW SURGERY     Right  . EP IMPLANTABLE DEVICE N/A 09/10/2015   Procedure: Loop Recorder Insertion;  Surgeon: [name], MD;  Location: [place] CV LAB;  Service: Cardiovascular;  Laterality: N/A;  . FLEXIBLE SIGMOIDOSCOPY     1990's  . FOOT SURGERY     Right   . I&D KNEE WITH POLY EXCHANGE Left 12/12/2012   Procedure: LEFT KNEE EXCISION SAPHENOUS NEUROMA/OPEN SCAR DEBRIDEMENT/POLY EXCHANGE/NERVE EXCISION;  Surgeon: Mauri Pole, MD;  [facility];  Service: Orthopedics;  Laterality: Left;  . JOINT REPLACEMENT Bilateral    4'14  . KNEE SURGERY     x 9 Left Knee  . SHOULDER SURGERY     Right   . TEE WITHOUT CARDIOVERSION  12/17/2011   Procedure: TRANSESOPHAGEAL ECHOCARDIOGRAM (TEE);  Surgeon: Larey Dresser, MD;  Location: [facility] ENDOSCOPY;  Service: Cardiovascular;  Laterality: N/A;  . TEE WITHOUT CARDIOVERSION  07/28/2012   Procedure: TRANSESOPHAGEAL ECHOCARDIOGRAM (TEE);  Surgeon: Thayer Headings, MD;  Location: [place];  Service: Cardiovascular;  Laterality: N/A;  . TEE WITHOUT CARDIOVERSION N/A 01/01/2016   Procedure: TRANSESOPHAGEAL ECHOCARDIOGRAM (TEE);  Surgeon: Sanda Klein, MD;  Location: [employer] [facility];  Service: Cardiovascular;  Laterality: N/A;  .  TOTAL HIP ARTHROPLASTY Left 10/17/2013   Procedure: LEFT TOTAL HIP ARTHROPLASTY ANTERIOR APPROACH;  Surgeon: Mauri Pole, MD;  [facility];  Service: Orthopedics;  Laterality: Left;  . TOTAL KNEE ARTHROPLASTY Right 12/12/2012   Procedure: RIGHT TOTAL KNEE ARTHROPLASTY;  Surgeon: Mauri Pole, MD;  [facility];  Service: Orthopedics;  Laterality: Right;  . UPPER GASTROINTESTINAL ENDOSCOPY  01/30/2010   hiatal hernia, fundic gland polyps  . WRIST SURGERY     Left     ROS-L ankle pain limits activity, all other systems reviewed and negative except as per above  Current  Outpatient Prescriptions  Medication Sig Dispense Refill  . acetaminophen (TYLENOL) 500 MG tablet Take 1,000 mg by mouth every 6 (six) hours as needed (For pain.).     Marland Kitchen amoxicillin (AMOXIL) 500 MG capsule Take 2,000 mg by mouth once as needed. Take 1 hour prior to dental appointment.  4  . diazepam (VALIUM) 5 MG tablet Take 5 mg by mouth at bedtime.     Marland Kitchen ketotifen (ZADITOR) 0.025 % ophthalmic solution Place 1 drop into both eyes 2 (two) times daily as needed (allergies/ itching).    . methocarbamol (ROBAXIN) 750 MG tablet Take 750 mg by mouth every 12 (twelve) hours as needed for muscle spasms.   1  . metoprolol succinate (TOPROL-XL) 100 MG 24 hr tablet Take 1 tablet (100 mg total) by mouth daily. Take with or immediately following a meal. 30 tablet 1  . metoprolol tartrate (LOPRESSOR) 25 MG tablet Take 25 mg by mouth 2 (two) times daily as needed (AFIB).    . Multiple Vitamin (MULTIVITAMIN WITH MINERALS) TABS tablet Take 1 tablet by mouth daily.    . Omega-3 Fatty Acids (FISH OIL PO) Take 1 capsule by mouth daily.     . pantoprazole (PROTONIX) 40 MG tablet TAKE 1 TABLET (40 MG TOTAL) BY MOUTH DAILY AS NEEDED (HEARTBURN). 90 tablet 0  . rivaroxaban (XARELTO) 20 MG TABS tablet Take 20 mg by mouth at bedtime.    Alveda Reasons 20 MG TABS tablet TAKE 1 TABLET BY MOUTH AT BEDTIME 30 tablet 11   No current facility-administered medications for this visit.     Physical Exam: Vitals:   03/18/16 0920  BP: 132/88  BP Location: Left Arm  Patient Position: Sitting  Cuff Size: Normal  Pulse: (!) 116  SpO2: 97%  Weight: 271 lb 12.8 oz (123.3 kg)  Height: 5\' 11"  (1.803 m)    GEN- The patient is well appearing, alert and oriented x 3 today.   Head- normocephalic, atraumatic Eyes-  Sclera clear, conjunctiva pink Ears- hearing intact Oropharynx- clear Lungs- Clear to ausculation bilaterally, normal work of breathing Heart- irregular rate and rhythm, no murmurs, rubs or gallops, PMI not laterally  displaced GI- soft, NT, ND, + BS Extremities- no clubbing, cyanosis, or edema  ekg today reveals afib   ILR is reviewed and reveals AF burden of 100%  Assessment and Plan:  1. Afib She has failed tikosyn.  S/p ablation x 2 in 2013.  Given severe LA enlargement, I would not anticipate additional endocardial ablation to be useful.  She is very young and has refractory afib.  I think that she would do much better long term with sinus rhythm.  I think she is too young for amiodarone long term. She will proceed with RHC/LHC and subsequent follow-up with Dr Roxy Manns for MAZE.  2. H/o DVT Continue xarelto long term  3. Overweight Body mass index is  37.91 kg/m. Weight reduction advised  4. EF 45% Likely due to afib with RVR RHC/LHC as per Dr Janeece Fitting MD, [facility] 03/22/2016 10:54 PM

## 2016-04-10 NOTE — Progress Notes (Signed)
Site area: rt ac venous sheath Site Prior to Removal:  Level 0 Pressure Applied For:  10 minutes Manual:   yes Patient Status During Pull:  stable Post Pull Site:  Level  0 Post Pull Instructions Given:  yes Post Pull Pulses Present: yes   Dressing Applied:  Small tegaderm Bedrest begins @ 1100 Comments:

## 2016-04-10 NOTE — Interval H&P Note (Signed)
Cath Lab Visit (complete for each Cath Lab visit)  Clinical Evaluation Leading to the Procedure:   ACS: No.  Non-ACS:    Anginal Classification: CCS II  Anti-ischemic medical therapy: Minimal Therapy (1 class of medications)  Non-Invasive Test Results: No non-invasive testing performed  Prior CABG: No previous CABG   Planning diagnostic before Maze procedure.   History and Physical Interval Note:  04/10/2016 9:27 AM  Destiny Dennis  has presented today for surgery, with the diagnosis of Chest Pain  The various methods of treatment have been discussed with the patient and family. After consideration of risks, benefits and other options for treatment, the patient has consented to  Procedure(s): Right/Left Heart Cath and Coronary Angiography (N/A) as a surgical intervention .  The patient's history has been reviewed, patient examined, no change in status, stable for surgery.  I have reviewed the patient's chart and labs.  Questions were answered to the patient's satisfaction.     Larae Grooms

## 2016-04-13 ENCOUNTER — Other Ambulatory Visit: Payer: Self-pay | Admitting: *Deleted

## 2016-04-13 ENCOUNTER — Ambulatory Visit (INDEPENDENT_AMBULATORY_CARE_PROVIDER_SITE_OTHER): Payer: Managed Care, Other (non HMO) | Admitting: Thoracic Surgery (Cardiothoracic Vascular Surgery)

## 2016-04-13 ENCOUNTER — Encounter: Payer: Self-pay | Admitting: Thoracic Surgery (Cardiothoracic Vascular Surgery)

## 2016-04-13 VITALS — BP 138/86 | HR 92 | Resp 20 | Ht 71.0 in | Wt 266.0 lb

## 2016-04-13 DIAGNOSIS — I4891 Unspecified atrial fibrillation: Secondary | ICD-10-CM

## 2016-04-13 DIAGNOSIS — I4819 Other persistent atrial fibrillation: Secondary | ICD-10-CM

## 2016-04-13 DIAGNOSIS — I481 Persistent atrial fibrillation: Secondary | ICD-10-CM

## 2016-04-13 DIAGNOSIS — Q211 Atrial septal defect: Secondary | ICD-10-CM

## 2016-04-13 DIAGNOSIS — Q2111 Secundum atrial septal defect: Secondary | ICD-10-CM

## 2016-04-13 NOTE — Progress Notes (Signed)
[address]       [place] [address]             [phone]     CARDIOTHORACIC SURGERY OFFICE NOTE  Referring Provider is [name], MD PCP is Lucretia Kern., DO   HPI:  Patient returns to the office today for follow-up of chronic persistent atrial fibrillation. She was originally seen in consultation on 01/27/2016 at which time long-term treatment strategies for management of atrial fibrillation were discussed. Since then she has been seen in follow-up by Dr. Rayann Heman on 03/18/2016.  She remained in atrial fibrillation at that time, and she has since then decided that she is interested in proceeding with elective Maze procedure. She underwent left and right heart catheterization on 04/10/2016. She did not have any significant coronary artery disease.  Left ventricular ejection fraction was estimated 45%. She did not have pulmonary hypertension. Oxygen saturation run to check for a step up and oxygen saturation and quantify shunt fraction related to her small atrial septal defect was apparently not performed.  The patient returns for office today with her husband. She reports no new problems or complaints. She complains of stable chronic fatigue and exertional shortness of breath.  She continues to have chronic palpitations. She has not had chest pain, dizziness, or syncope.  She has been referred to pulmonary medicine for a sleep study to test for the possibility of obstructive sleep apnea, but this has not yet been performed.   Current Outpatient Prescriptions  Medication Sig Dispense Refill  . acetaminophen (TYLENOL) 500 MG tablet Take 1,000 mg by mouth every 6 (six) hours as needed (For pain.).     Marland Kitchen amoxicillin (AMOXIL) 500 MG capsule Take 2,000 mg by mouth once as needed. Take 1 hour prior to dental appointment.  4  . diazepam (VALIUM) 5 MG tablet Take 5 mg by mouth at bedtime.     Marland Kitchen ketotifen (ZADITOR) 0.025 % ophthalmic solution Place 1 drop into both eyes 2  (two) times daily as needed (allergies/ itching).    . methocarbamol (ROBAXIN) 750 MG tablet Take 750 mg by mouth every 12 (twelve) hours as needed for muscle spasms.   1  . metoprolol succinate (TOPROL-XL) 100 MG 24 hr tablet Take 1 tablet (100 mg total) by mouth 2 (two) times daily. Take with or immediately following a meal. 180 tablet 3  . metoprolol tartrate (LOPRESSOR) 25 MG tablet Take 25 mg by mouth 2 (two) times daily as needed (AFIB).    . Multiple Vitamin (MULTIVITAMIN WITH MINERALS) TABS tablet Take 1 tablet by mouth daily.    . Omega-3 Fatty Acids (FISH OIL PO) Take 1 capsule by mouth daily.     . pantoprazole (PROTONIX) 40 MG tablet TAKE 1 TABLET (40 MG TOTAL) BY MOUTH DAILY AS NEEDED (HEARTBURN). 90 tablet 0  . rivaroxaban (XARELTO) 20 MG TABS tablet Take 1 tablet (20 mg total) by mouth at bedtime. 30 tablet   . XARELTO 20 MG TABS tablet TAKE 1 TABLET BY MOUTH AT BEDTIME 30 tablet 11   No current facility-administered medications for this visit.       Physical Exam:   BP 138/86 (BP Location: Left Arm, Patient Position: Sitting, Cuff Size: Large)   Pulse 92   Resp 20   Ht 5\' 11"  (1.803 m)   Wt 266 lb (120.7 kg)   LMP 04/17/2012   SpO2 98%   BMI 37.10 kg/m   General:  Obese  but well appearing  Chest:   Clear to auscultation  CV:   Irregular rate and rhythm  Incisions:  n/a  Abdomen:  Soft and nontender  Extremities:  Warm and well-perfused  Diagnostic Tests:  Right/Left Heart Cath and Coronary Angiography  Conclusion     There is mild left ventricular systolic dysfunction.  The left ventricular ejection fraction is 45% by visual estimate.  LV end diastolic pressure is normal.  There is no aortic valve stenosis.  LV end diastolic pressure is normal.  There is no pulmonic valve stenosis.  PA sat 70%. CO 8 L/min. Cardiac Index 3.3.  No angiographically apparent coronary artery disease.  No AAA.   Continue with plans for Maze procedure to treat atrial  fibrillation.   Continue aggressive medical therapy.  Restart Xarelto tomorrow.   Indications   Persistent atrial fibrillation (HCC) [I48.1 (ICD-10-CM)]  Cardiomyopathy ([place]) [I42.9 (ICD-10-CM)]  Procedural Details/Technique   Technical Details The risks, benefits, and details of the procedure were explained to the patient. The patient verbalized understanding and wanted to proceed. Informed written consent was obtained.  PROCEDURE TECHNIQUE: After Xylocaine anesthesia, a 5 French sheath was placed in the right brachial area in exchange for a peripheral IV. A 5 French balloontipped Swan-Ganz catheter was advanced to the pulmonary artery under fluoroscopic guidance. Hemodynamic pressures were obtained. Oxygen saturations were obtained. After Xylocaine anesthesia, a 30F sheath was placed in the right radial artery with a single anterior needle wall stick. Left coronary angiography was done using a Judkins L3.5 guide catheter. Right coronary angiography was done using a Judkins R4 guide catheter. Left heart cath/ventriculogram was done using a pigtail catheter. Abdominal aortogram was done using the pigtail catheter and power injection of contrast.    Contrast: 100    Estimated blood loss <50 mL. . During this procedure the patient was administered the following to achieve and maintain moderate conscious sedation: Versed 3 mg, Fentanyl 50 mcg, while the patient's heart rate, blood pressure, and oxygen saturation were continuously monitored. The period of conscious sedation was 51 minutes, of which I was present face-to-face 100% of this time.    Complications   Complications documented before study signed (04/10/2016 10:33 AM EDT)    No complications were associated with this study.  Documented by Jettie Booze, MD - 04/10/2016 10:29 AM EDT    Coronary Findings   Dominance: Co-dominant  Left Main  Vessel was injected. Vessel is normal in caliber. Vessel is angiographically normal.    Left Anterior Descending  Vessel was injected. Vessel is normal in caliber. Vessel is angiographically normal.  Ramus Intermedius  Vessel was injected. Vessel is normal in caliber. Vessel is angiographically normal.  Left Circumflex  Vessel was injected. Vessel is normal in caliber. Vessel is angiographically normal.  Right Coronary Artery  Vessel was injected. Vessel is normal in caliber. Vessel is angiographically normal.  Right Heart   Right Heart Pressures LV EDP is normal.    Right Atrium Right atrial pressure is normal.    Pulmonic Valve There is no pulmonic valve stenosis.    Left Heart   Left Ventricle The left ventricular size is in the upper limits of normal. There is mild left ventricular systolic dysfunction. LV end diastolic pressure is normal. The left ventricular ejection fraction is 45-50% by visual estimate. There are LV function abnormalities due to global hypokinesis.    Aortic Valve There is no aortic valve stenosis.    Coronary Diagrams   Diagnostic Diagram  Implants     No implant documentation for this case.  PACS Images   Show images for Cardiac catheterization   Link to Procedure Log   Procedure Log    Hemo Data   Flowsheet Row Most Recent Value  Fick Cardiac Output 8.01 L/min  Fick Cardiac Output Index 3.37 (L/min)/BSA  Aortic Mean Gradient 3.8 mmHg  Aortic Peak Gradient 0 mmHg  Aortic Valve Area >3.50  Aortic Value Area Index 1.47 cm2/BSA  RA A Wave -99 mmHg  RA V Wave 12 mmHg  RA Mean 10 mmHg  RV Systolic Pressure 35 mmHg  RV Diastolic Pressure 2 mmHg  RV EDP 10 mmHg  PA Systolic Pressure 36 mmHg  PA Diastolic Pressure 16 mmHg  PA Mean 25 mmHg  PW A Wave -99 mmHg  PW V Wave 22 mmHg  PW Mean 19 mmHg  AO Systolic Pressure 123XX123 mmHg  AO Diastolic Pressure 76 mmHg  AO Mean 94 mmHg  LV Systolic Pressure 123456 mmHg  LV Diastolic Pressure 4 mmHg  LV EDP 11 mmHg  Arterial Occlusion Pressure Extended Systolic Pressure [id number] mmHg   Arterial Occlusion Pressure Extended Diastolic Pressure 77 mmHg  Arterial Occlusion Pressure Extended Mean Pressure 101 mmHg  Left Ventricular Apex Extended Systolic Pressure [id number] mmHg  Left Ventricular Apex Extended Diastolic Pressure 4 mmHg  Left Ventricular Apex Extended EDP Pressure 11 mmHg  QP/QS 1  TPVR Index 7.42 HRUI  TSVR Index 27.91 HRUI  PVR SVR Ratio 0.07  TPVR/TSVR Ratio 0.27    CT ANGIOGRAPHY CHEST, ABDOMEN AND PELVIS  TECHNIQUE: Multidetector CT imaging through the chest, abdomen and pelvis was performed using the standard protocol during bolus administration of intravenous contrast. Multiplanar reconstructed images and MIPs were obtained and reviewed to evaluate the vascular anatomy.  CONTRAST:  75 cc Isovue 370  COMPARISON:  CT 09/15/2013, 04/27/2011  FINDINGS: Vascular:  CHEST  Unremarkable course caliber and contour of the thoracic aorta. No dissection. No aneurysm. Greatest diameter of the ascending aorta measures approximately 3.5 cm.  Common origin of the innominate artery and left common carotid artery. Branch vessels remain patent.  No periaortic fluid.  No central, lobar, or proximal segmental pulmonary artery filling defects. Beyond this, pulmonary arterial tree not well evaluated.  Review of the MIP images confirms the above findings.  ABDOMEN AND PELVIS  Aorta:  Unremarkable course caliber and contour of the abdominal aorta. Minimal aortic calcifications. No dissection flap. No aneurysm. No periaortic fluid.  Mesenteric vessels:  No significant plaque involving the celiac artery, superior mesenteric artery, or inferior mesenteric artery, which remain patent.  Renal arteries:  Left and right renal arteries remain patent. Accessory left renal artery just superior to the main left renal artery. No accessory right renal arteries identified.  Right lower extremity:  Common iliac artery unremarkable in course  caliber and contour.  Hypogastric artery remains patent including anterior and posterior division.  External iliac artery unremarkable with no significant plaque. No dissection or aneurysm.  Common femoral artery and the proximal profunda femoris and superficial femoral artery remain patent.  Left lower extremity:  Unremarkable course caliber and contour of the left common iliac artery without aneurysm dissection or occlusion.  Hypogastric artery remains patent and occluding anterior and posterior division.  Unremarkable appearance of the left external iliac artery with no aneurysm dissection flap or occlusion.  Left common femoral artery patent.  Proximal superficial femoral artery and profunda femoris are patent.  Nonvascular:  Chest:  Unremarkable appearance of the superficial  soft tissues. Loop recorder in position on left chest wall.  No lymphadenopathy.  Unremarkable appearance of the thoracic inlet.  Heart size borderline enlarged.  No pericardial fluid/ thickening.  Moderate-sized hiatal hernia.  No mediastinal lymphadenopathy.  Respiratory motion somewhat limits evaluation of the lungs. No confluent airspace disease. No pneumothorax or pleural effusion. Central airways are clear.  No displaced fractures. No significant bony canal narrowing of the thoracic spine.  Abdomen/pelvis:  Unremarkable spleen.  Focal fatty infiltration in segment 4 adjacent to the falciform ligament.  Nonspecific hypervascular lesion measuring 16 mm within the right liver lobe, segment 6. This is unchanged from the comparison CT of 09/15/2013 and of 2012.  Cholecystectomy.  Unremarkable pancreas.  Unremarkable bilateral adrenal glands.  Unremarkable bilateral kidneys. Unremarkable course the bilateral ureters.  Unremarkable urinary bladder.  Unremarkable appearance of the uterus and adnexa.  Unremarkable appearance of small  bowel.  Colonic diverticular present throughout the length of the colon. No associated inflammatory changes.  Appendix is not visualized, however, no inflammatory changes are present adjacent to the cecum to indicate an appendicitis.  Moderate-sized hiatal hernia.  No displaced fracture. Mild degenerative changes of the lumbar spine. No significant bony canal narrowing.  Surgical changes of left hip arthroplasty.  Review of the MIP images confirms the above findings.  IMPRESSION: No acute finding of the chest, abdomen, pelvis CT angiogram.  Minimal aortic atherosclerosis.  Moderate size hiatal hernia.  Colonic diverticula without acute findings of diverticulitis.  Hypervascular lesion within the right liver lobe unchanged from comparison studies, most compatible with benign vascular lesion.  Signed,  Dulcy Fanny. Earleen Newport, DO  [facility]  [facility]   Electronically Signed   By: Corrie Mckusick D.O.   On: 04/06/2016 11:07  Impression:  Patient has a long history of chronic atrial fibrillation dating back to 2008. She has failed medical therapy with decrease in and is not felt to be a candidate for long-term amiodarone therapy. She has failed catheter-based ablation on 2 previous occasions in 2003. Recently she has remained in continuous persistent atrial fibrillation and she complains of worsening symptoms of palpitations, exertional shortness of breath, fatigue, and decreased exercise tolerance. 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 She also has a small atrial septal defect, presumably from her previous ablation procedures. Diagnostic cardiac catheterization is notable for the absence of significant coronary artery disease. Options  include long-term medical therapy versus another attempt at catheter-based ablation versus surgical Maze procedure.  CT angiogram of the aorta and iliac vessels revealed no contraindication to peripheral cannulation for surgery.    Plan:  I have again reviewed the indications, risks, and potential benefits of Maze procedure and closure of her atrial septal defect with the patient and her husband in the office today. Alternative approaches have been discussed including continued medical therapy versus Friday of interventional techniques. Alternative surgical approaches have been discussed including a comparison of techniques both with or without use of cardiopulmonary bypass. The mini thoracotomy approach for Maze procedure was discussed and compared with conventional median sternotomy.  All of their questions have been addressed. The patient has decided she would like to proceed with surgery in October. We tentatively plan to proceed with minimally invasive Maze procedure and closure of atrial septal defect on 05/28/2016. The patient will return to our office for follow-up on 05/18/2016. At that time we will begin  the patient on amiodarone and discuss the timing of stopping her Xarelto  I spent in excess of 15 minutes during the conduct of this office consultation and >50% of this time involved direct face-to-face encounter with the patient for counseling and/or coordination of their care.    Valentina Gu. Roxy Manns, MD 04/13/2016 4:33 PM

## 2016-04-13 NOTE — Patient Instructions (Signed)
Continue all previous medications without any changes at this time  

## 2016-05-06 LAB — CUP PACEART REMOTE DEVICE CHECK: Date Time Interrogation Session: 20170822133654

## 2016-05-06 NOTE — Progress Notes (Signed)
Carelink summary report received. Battery status OK. Normal device function. No new symptom episodes, tachy episodes, brady, or pause episodes. 1 AF- 100% +Xarelto. Monthly summary reports and ROV/PRN

## 2016-05-07 ENCOUNTER — Ambulatory Visit (INDEPENDENT_AMBULATORY_CARE_PROVIDER_SITE_OTHER): Payer: Managed Care, Other (non HMO) | Admitting: *Deleted

## 2016-05-07 DIAGNOSIS — I480 Paroxysmal atrial fibrillation: Secondary | ICD-10-CM | POA: Diagnosis not present

## 2016-05-07 NOTE — Progress Notes (Signed)
Carelink Summary Report / Loop Recorder 

## 2016-05-18 ENCOUNTER — Ambulatory Visit (INDEPENDENT_AMBULATORY_CARE_PROVIDER_SITE_OTHER): Payer: Managed Care, Other (non HMO) | Admitting: Thoracic Surgery (Cardiothoracic Vascular Surgery)

## 2016-05-18 ENCOUNTER — Encounter: Payer: Self-pay | Admitting: Thoracic Surgery (Cardiothoracic Vascular Surgery)

## 2016-05-18 VITALS — BP 147/84 | HR 96 | Resp 16 | Ht 71.0 in | Wt 266.0 lb

## 2016-05-18 DIAGNOSIS — Q211 Atrial septal defect, unspecified: Secondary | ICD-10-CM

## 2016-05-18 DIAGNOSIS — Q2111 Secundum atrial septal defect: Secondary | ICD-10-CM

## 2016-05-18 DIAGNOSIS — I481 Persistent atrial fibrillation: Secondary | ICD-10-CM | POA: Diagnosis not present

## 2016-05-18 DIAGNOSIS — I4819 Other persistent atrial fibrillation: Secondary | ICD-10-CM

## 2016-05-18 MED ORDER — AMIODARONE HCL 200 MG PO TABS: 200.0000 mg | ORAL_TABLET | Freq: Two times a day (BID) | ORAL | 0 refills | Status: DC

## 2016-05-18 NOTE — Progress Notes (Signed)
[address]       [place] [address]             [phone]     CARDIOTHORACIC SURGERY OFFICE NOTE  Referring Provider is [name], MD PCP is Lucretia Kern., DO   HPI:  Patient is a 55 year old obese white female who returns to the office today for follow-up of chronic persistent atrial fibrillation with plans to proceed with elective minimally invasive Maze procedure and closure of atrial septal defect on 05/28/2016. She was last seen here in our office on 04/13/2016. Since then she has remained clinically stable. She reports no new problems or complaints. She continues to experience chronic symptoms of exertional shortness of breath and fatigue.   Current Outpatient Prescriptions  Medication Sig Dispense Refill  . acetaminophen (TYLENOL) 500 MG tablet Take 1,000 mg by mouth every 6 (six) hours as needed (For pain.).     Marland Kitchen amoxicillin (AMOXIL) 500 MG capsule Take 2,000 mg by mouth once as needed. Take 1 hour prior to dental appointment.  4  . diazepam (VALIUM) 5 MG tablet Take 5 mg by mouth at bedtime.     Marland Kitchen ketotifen (ZADITOR) 0.025 % ophthalmic solution Place 1 drop into both eyes 2 (two) times daily as needed (allergies/ itching).    . metoprolol succinate (TOPROL-XL) 100 MG 24 hr tablet Take 1 tablet (100 mg total) by mouth 2 (two) times daily. Take with or immediately following a meal. 180 tablet 3  . metoprolol tartrate (LOPRESSOR) 25 MG tablet Take 25 mg by mouth 2 (two) times daily as needed (AFIB).    . Multiple Vitamin (MULTIVITAMIN WITH MINERALS) TABS tablet Take 1 tablet by mouth daily.    . Omega-3 Fatty Acids (FISH OIL PO) Take 1 capsule by mouth daily.     . pantoprazole (PROTONIX) 40 MG tablet TAKE 1 TABLET (40 MG TOTAL) BY MOUTH DAILY AS NEEDED (HEARTBURN). 90 tablet 0  . XARELTO 20 MG TABS tablet TAKE 1 TABLET BY MOUTH AT BEDTIME 30 tablet 11   No current facility-administered medications for this visit.       Physical Exam:   BP  (!) 147/84   Pulse 96   Resp 16   Ht 5\' 11"  (1.803 m)   Wt 266 lb (120.7 kg)   LMP 04/17/2012   SpO2 97% Comment: ON RA  BMI 37.10 kg/m   General:  Obese but well appearing  Chest:   Clear to auscultation  CV:   Irregular rate and rhythm  Incisions:  n/a  Abdomen:  Soft and nontender  Extremities:  Warm and well-perfused  Diagnostic Tests:  n/a   Impression:  Patient has a long history of chronic atrial fibrillation dating back to 2008. She has failed medical therapy with Tikosyn and is not felt to be a candidate for long-term amiodarone therapy. She has failed catheter-based ablation on 2 previous occasions in 2003. Recently she has remained in continuous persistent atrial fibrillation and she complains of worsening symptoms of palpitations, exertional shortness of breath, fatigue, and decreased exercise tolerance. 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 She also has a small atrial septal defect, presumably from her previous ablation procedures. Diagnostic cardiac catheterization is notable for the absence of significant coronary artery disease. Options include long-term medical therapy versus another attempt at catheter-based ablation versus surgical Maze procedure.  CT angiogram of the aorta and iliac vessels revealed no contraindication to peripheral cannulation for surgery.   Plan:  I have again reviewed the indications, risks, and potential benefits of Maze procedure and closure of her atrial septal defect with the patient and her husband in the office today. Alternative approaches have been discussed including continued medical therapy versus a variety of interventional techniques. Alternative surgical approaches have been discussed including a comparison of techniques both with or without use of  cardiopulmonary bypass. The mini thoracotomy approach for Maze procedure was discussed and compared with conventional median sternotomy.  The expected likelihood of long term freedom from recurrent symptomatic atrial fibrillation and/or atrial flutter following surgery has been discussed.  Expectations for the patient's postoperative convalescence have been discussed. All of their questions have been addressed.    The patient understands and accepts all potential associated risks of surgery including but not limited to risk of death, stroke, myocardial infarction, congestive heart failure, respiratory failure, renal failure, bleeding requiring blood transfusion and/or reexploration, arrhythmia, heart block or bradycardia requiring permanent pacemaker, pneumonia, pleural effusion, wound infection, pulmonary embolus or other thromboembolic complication, chronic pain or other delayed complications.  Alternative surgical approaches have been discussed including a comparison between conventional sternotomy and minimally-invasive techniques.  The relative risks and benefits of each have been reviewed as they pertain to the patient's specific circumstances, and all of their questions have been addressed.  Specific risks potentially related to the minimally-invasive approach were discussed at length, including but not limited to risk of conversion to full or partial sternotomy, aortic dissection or other major vascular complication, unilateral acute lung injury or pulmonary edema, phrenic nerve dysfunction or paralysis, rib fracture, chronic pain, lung hernia, or lymphocele.   All questions answered.  The patient has been instructed to stop taking Xarelto 1 week prior to surgery. We discussed whether or not to use Lovenox injections as a bridge and have decided not to do so. The patient has been given a prescription for amiodarone to begin 1 week prior to surgery.   I spent in excess of 30 minutes during the conduct  of this office consultation and >50% of this time involved direct face-to-face encounter with the patient for counseling and/or coordination of their care.   Valentina Gu. Roxy Manns, MD 05/18/2016 2:03 PM

## 2016-05-18 NOTE — Patient Instructions (Signed)
Patient has been instructed to stop taking Xarelto and begin taking amiodarone on Thursday 05/21/2016  Patient should continue taking all other medications without change through the day before surgery.  Patient should have nothing to eat or drink after midnight the night before surgery.  On the morning of surgery patient should take only Metoprolol and Protonix with a sip of water.

## 2016-05-26 ENCOUNTER — Encounter (HOSPITAL_COMMUNITY)
Admission: RE | Admit: 2016-05-26 | Discharge: 2016-05-26 | Disposition: A | Discharge status: Home / Self Care | Payer: Managed Care, Other (non HMO) | Source: Ambulatory Visit | Attending: Thoracic Surgery (Cardiothoracic Vascular Surgery) | Admitting: Thoracic Surgery (Cardiothoracic Vascular Surgery)

## 2016-05-26 ENCOUNTER — Encounter (HOSPITAL_COMMUNITY): Payer: Self-pay

## 2016-05-26 ENCOUNTER — Ambulatory Visit (HOSPITAL_COMMUNITY)
Admission: RE | Admit: 2016-05-26 | Discharge: 2016-05-26 | Disposition: A | Discharge status: Home / Self Care | Payer: Managed Care, Other (non HMO) | Source: Ambulatory Visit | Attending: Thoracic Surgery (Cardiothoracic Vascular Surgery) | Admitting: Thoracic Surgery (Cardiothoracic Vascular Surgery)

## 2016-05-26 DIAGNOSIS — I4430 Unspecified atrioventricular block: Secondary | ICD-10-CM | POA: Diagnosis not present

## 2016-05-26 DIAGNOSIS — I4892 Unspecified atrial flutter: Secondary | ICD-10-CM | POA: Insufficient documentation

## 2016-05-26 DIAGNOSIS — I4891 Unspecified atrial fibrillation: Secondary | ICD-10-CM | POA: Insufficient documentation

## 2016-05-26 DIAGNOSIS — Z01818 Encounter for other preprocedural examination: Secondary | ICD-10-CM | POA: Insufficient documentation

## 2016-05-26 DIAGNOSIS — R9431 Abnormal electrocardiogram [ECG] [EKG]: Secondary | ICD-10-CM | POA: Diagnosis not present

## 2016-05-26 DIAGNOSIS — Z01810 Encounter for preprocedural cardiovascular examination: Secondary | ICD-10-CM | POA: Insufficient documentation

## 2016-05-26 HISTORY — DX: Peripheral vascular disease, unspecified: I73.9

## 2016-05-26 LAB — CBC
HEMATOCRIT: 39 % (ref 36.0–46.0)
Hemoglobin: 13 g/dL (ref 12.0–15.0)
MCH: 28.8 pg (ref 26.0–34.0)
MCHC: 33.3 g/dL (ref 30.0–36.0)
MCV: 86.5 fL (ref 78.0–100.0)
PLATELETS: 324 10*3/uL (ref 150–400)
RBC: 4.51 MIL/uL (ref 3.87–5.11)
RDW: 13.7 % (ref 11.5–15.5)
WBC: 11.2 10*3/uL — AB (ref 4.0–10.5)

## 2016-05-26 LAB — COMPREHENSIVE METABOLIC PANEL
ALBUMIN: 4.2 g/dL (ref 3.5–5.0)
ALT: 25 U/L (ref 14–54)
ANION GAP: 10 (ref 5–15)
AST: 19 U/L (ref 15–41)
Alkaline Phosphatase: 84 U/L (ref 38–126)
BILIRUBIN TOTAL: 0.3 mg/dL (ref 0.3–1.2)
BUN: 16 mg/dL (ref 6–20)
CHLORIDE: 104 mmol/L (ref 101–111)
CO2: 24 mmol/L (ref 22–32)
Calcium: 9.6 mg/dL (ref 8.9–10.3)
Creatinine, Ser: 0.88 mg/dL (ref 0.44–1.00)
GFR calc Af Amer: >60 mL/min (ref >60)
GLUCOSE: 96 mg/dL (ref 65–99)
POTASSIUM: 3.7 mmol/L (ref 3.5–5.1)
Sodium: 138 mmol/L (ref 135–145)
TOTAL PROTEIN: 7.2 g/dL (ref 6.5–8.1)

## 2016-05-26 LAB — URINALYSIS, ROUTINE W REFLEX MICROSCOPIC
Bilirubin Urine: NEGATIVE
GLUCOSE, UA: NEGATIVE mg/dL
KETONES UR: NEGATIVE mg/dL
LEUKOCYTES UA: NEGATIVE
Nitrite: NEGATIVE
PH: 5.5 (ref 5.0–8.0)
Protein, ur: NEGATIVE mg/dL
Specific Gravity, Urine: 1.008 (ref 1.005–1.030)

## 2016-05-26 LAB — SURGICAL PCR SCREEN
MRSA, PCR: NEGATIVE
Staphylococcus aureus: NEGATIVE

## 2016-05-26 LAB — URINE MICROSCOPIC-ADD ON: BACTERIA UA: NONE SEEN

## 2016-05-26 LAB — BLOOD GAS, ARTERIAL
ACID-BASE EXCESS: 0.1 mmol/L (ref 0.0–2.0)
Bicarbonate: 23.6 mmol/L (ref 20.0–28.0)
Drawn by: 242311
FIO2: 21
O2 SAT: 95.9 %
PATIENT TEMPERATURE: 98.6
PCO2 ART: 34.5 mmHg (ref 32.0–48.0)
PO2 ART: 81.4 mmHg — AB (ref 83.0–108.0)
pH, Arterial: 7.45 (ref 7.350–7.450)

## 2016-05-26 LAB — PROTIME-INR
INR: 0.98
PROTHROMBIN TIME: 13 s (ref 11.4–15.2)

## 2016-05-26 LAB — APTT: APTT: 32 s (ref 24–36)

## 2016-05-26 NOTE — Pre-Procedure Instructions (Addendum)
ADINAH SNAVELY  05/26/2016      [id number] - [place] - [address] AT [address] [place] [address] Phone: [phone] Fax: [phone]    Your procedure is scheduled on 05/28/16  Report to [facility] Admitting at 530* A.M.  Call this number if you have problems the morning of surgery:  [phone]   Remember:  Do not eat food or drink liquids after midnight.  Take these medicines the morning of surgery with A SIP OF WATER amiodarone,metoprolol, pantoprazole(protonix), tramadol if needed  STOP all herbel meds, nsaids (aleve,naproxen,advil,ibuprofen) starting today including celebrex, diclofenac, multi vit, fish oil  xarelto per dr (05/21/16)   Do not wear jewelry, make-up or nail polish.  Do not wear lotions, powders, or perfumes, or deoderant.  Do not shave 48 hours prior to surgery.  Men may shave face and neck.  Do not bring valuables to the hospital.  [facility] is not responsible for any belongings or valuables.  Contacts, dentures or bridgework may not be worn into surgery.  Leave your suitcase in the car.  After surgery it may be brought to your room.  For patients admitted to the hospital, discharge time will be determined by your treatment team.  Patients discharged the day of surgery will not be allowed to drive home.   Name and phone number of your driver:    Special instructions:   Special Instructions: [place] - Preparing for Surgery  Before surgery, you can play an important role.  Because skin is not sterile, your skin needs to be as free of germs as possible.  You can reduce the number of germs on you skin by washing with CHG (chlorahexidine gluconate) soap before surgery.  CHG is an antiseptic cleaner which kills germs and bonds with the skin to continue killing germs even after washing.  Please DO NOT use if you have an allergy to CHG or antibacterial soaps.  If your  skin becomes reddened/irritated stop using the CHG and inform your nurse when you arrive at Short Stay.  Do not shave (including legs and underarms) for at least 48 hours prior to the first CHG shower.  You may shave your face.  Please follow these instructions carefully:   1.  Shower with CHG Soap the night before surgery and the morning of Surgery.  2.  If you choose to wash your hair, wash your hair first as usual with your normal shampoo.  3.  After you shampoo, rinse your hair and body thoroughly to remove the Shampoo.  4.  Use CHG as you would any other liquid soap.  You can apply chg directly  to the skin and wash gently with scrungie or a clean washcloth.  5.  Apply the CHG Soap to your body ONLY FROM THE NECK DOWN.  Do not use on open wounds or open sores.  Avoid contact with your eyes ears, mouth and genitals (private parts).  Wash genitals (private parts)       with your normal soap.  6.  Wash thoroughly, paying special attention to the area where your surgery will be performed.  7.  Thoroughly rinse your body with warm water from the neck down.  8.  DO NOT shower/wash with your normal soap after using and rinsing off the CHG Soap.  9.  Pat yourself dry with a clean towel.  10.  Wear clean pajamas.            11.  Place clean sheets on your bed the night of your first shower and do not sleep with pets.  Day of Surgery  Do not apply any lotions/deodorants the morning of surgery.  Please wear clean clothes to the hospital/surgery center.  Please read over the  fact sheets that you were given.

## 2016-05-26 NOTE — Progress Notes (Signed)
Pre-op Cardiac Surgery  Carotid Findings:Bilateral -  No evidence of ICA stenosis. Vertebral artery flow is antegrade.  Upper Extremity Right Left  Brachial Pressures 139 Triphasic 135 Triphasic  Radial Waveforms Triphasic Triphasic  Ulnar Waveforms Triphasic Triphasic  Palmar Arch (Allen's Test) Normal Normal   Findings:  Doppler waveforms remained normal bilaterally with both radial and ulnar compressions   [facility], RVS 05/26/16 06:40 PM

## 2016-05-27 ENCOUNTER — Encounter (HOSPITAL_COMMUNITY): Payer: Self-pay | Admitting: Certified Registered Nurse Anesthetist

## 2016-05-27 LAB — VAS US DOPPLER PRE CABG
LCCADDIAS: -22 cm/s
LCCADSYS: -78 cm/s
LEFT ECA DIAS: -16 cm/s
LEFT VERTEBRAL DIAS: -9 cm/s
LICADSYS: -71 cm/s
LICAPDIAS: -30 cm/s
LICAPSYS: -76 cm/s
Left CCA prox dias: 24 cm/s
Left CCA prox sys: 82 cm/s
Left ICA dist dias: -27 cm/s
RCCAPSYS: 86 cm/s
RIGHT ECA DIAS: -1 cm/s
RIGHT VERTEBRAL DIAS: -12 cm/s
Right CCA prox dias: 27 cm/s
Right cca dist sys: -76 cm/s

## 2016-05-27 LAB — HEMOGLOBIN A1C
HEMOGLOBIN A1C: 5.4 % (ref 4.8–5.6)
MEAN PLASMA GLUCOSE: 108 mg/dL

## 2016-05-27 MED ORDER — NITROGLYCERIN IN D5W 200-5 MCG/ML-% IV SOLN
2.0000 ug/min | INTRAVENOUS | Status: AC
  Administered 2016-05-28: 5 ug/min via INTRAVENOUS
  Filled 2016-05-27: qty 250

## 2016-05-27 MED ORDER — DEXTROSE 5 % IV SOLN
1.5000 g | INTRAVENOUS | Status: AC
  Administered 2016-05-28: 1.5 g via INTRAVENOUS
  Administered 2016-05-28: .5 g via INTRAVENOUS
  Filled 2016-05-27: qty 1.5

## 2016-05-27 MED ORDER — TRANEXAMIC ACID 1000 MG/10ML IV SOLN
1.5000 mg/kg/h | INTRAVENOUS | Status: AC
  Administered 2016-05-28 (×2): 1.5 mg/kg/h via INTRAVENOUS
  Filled 2016-05-27: qty 25

## 2016-05-27 MED ORDER — POTASSIUM CHLORIDE 2 MEQ/ML IV SOLN
80.0000 meq | INTRAVENOUS | Status: DC
  Filled 2016-05-27: qty 40

## 2016-05-27 MED ORDER — VANCOMYCIN HCL 10 G IV SOLR
1500.0000 mg | INTRAVENOUS | Status: AC
  Administered 2016-05-28: 1500 mg via INTRAVENOUS
  Filled 2016-05-27: qty 1500

## 2016-05-27 MED ORDER — SODIUM CHLORIDE 0.9 % IV SOLN
INTRAVENOUS | Status: DC
  Filled 2016-05-27: qty 2.5

## 2016-05-27 MED ORDER — PLASMA-LYTE 148 IV SOLN
INTRAVENOUS | Status: AC
  Administered 2016-05-28: 500 mL
  Filled 2016-05-27: qty 2.5

## 2016-05-27 MED ORDER — MAGNESIUM SULFATE 50 % IJ SOLN
40.0000 meq | INTRAMUSCULAR | Status: DC
  Filled 2016-05-27: qty 10

## 2016-05-27 MED ORDER — DOPAMINE-DEXTROSE 3.2-5 MG/ML-% IV SOLN
0.0000 ug/kg/min | INTRAVENOUS | Status: DC
  Filled 2016-05-27 (×2): qty 250

## 2016-05-27 MED ORDER — PHENYLEPHRINE HCL 10 MG/ML IJ SOLN
30.0000 ug/min | INTRAVENOUS | Status: AC
  Administered 2016-05-28: 50 ug/min via INTRAVENOUS
  Filled 2016-05-27: qty 2

## 2016-05-27 MED ORDER — DEXTROSE 5 % IV SOLN
750.0000 mg | INTRAVENOUS | Status: DC
  Filled 2016-05-27: qty 750

## 2016-05-27 MED ORDER — DEXTROSE 5 % IV SOLN
0.0000 ug/min | INTRAVENOUS | Status: DC
  Filled 2016-05-27: qty 4

## 2016-05-27 MED ORDER — CHLORHEXIDINE GLUCONATE 0.12 % MT SOLN
15.0000 mL | Freq: Once | OROMUCOSAL | Status: AC
  Administered 2016-05-28: 15 mL via OROMUCOSAL
  Filled 2016-05-27: qty 15

## 2016-05-27 MED ORDER — TRANEXAMIC ACID (OHS) BOLUS VIA INFUSION
15.0000 mg/kg | INTRAVENOUS | Status: AC
  Administered 2016-05-28: 1885.5 mg via INTRAVENOUS
  Filled 2016-05-27: qty 1886

## 2016-05-27 MED ORDER — DEXMEDETOMIDINE HCL IN NACL 400 MCG/100ML IV SOLN
0.1000 ug/kg/h | INTRAVENOUS | Status: DC
  Filled 2016-05-27: qty 100

## 2016-05-27 MED ORDER — TRANEXAMIC ACID (OHS) PUMP PRIME SOLUTION
2.0000 mg/kg | INTRAVENOUS | Status: DC
  Filled 2016-05-27: qty 2.51

## 2016-05-27 MED ORDER — SODIUM CHLORIDE 0.9 % IV SOLN
INTRAVENOUS | Status: DC
  Filled 2016-05-27: qty 30

## 2016-05-27 MED ORDER — METOPROLOL TARTRATE 12.5 MG HALF TABLET
12.5000 mg | ORAL_TABLET | ORAL | Status: DC
  Filled 2016-05-27: qty 1

## 2016-05-27 NOTE — Anesthesia Preprocedure Evaluation (Addendum)
Anesthesia Evaluation  Patient identified by MRN, date of birth, ID band Patient awake    Reviewed: Allergy & Precautions, NPO status , Patient's Chart, lab work & pertinent test results, reviewed documented beta blocker date and time   History of Anesthesia Complications (+) PONV and history of anesthetic complications  Airway Mallampati: II  TM Distance: <3 FB Neck ROM: Full    Dental  (+) Teeth Intact, Dental Advisory Given   Pulmonary neg pulmonary ROS,    Pulmonary exam normal breath sounds clear to auscultation       Cardiovascular hypertension, Pt. on home beta blockers and Pt. on medications + Peripheral Vascular Disease and + DVT  + dysrhythmias (s/p ablation, amiodarone therapy) Atrial Fibrillation  Rhythm:Irregular Rate:Normal  05/26/16 EKG: Aflutter at 113 bpm with variable A-V block. HR 104 bpm with vitals.  04/10/16 RHC/LHC:  There is mild left ventricular systolic dysfunction.  The left ventricular ejection fraction is 45% by visual estimate.  LV end diastolic pressure is normal.  There is no aortic valve stenosis.  LV end diastolic pressure is normal.  There is no pulmonic valve stenosis.  PA sat 70%. CO 8 L/min. Cardiac Index 3.3.  No angiographically apparent coronary artery disease.  No AAA. Continue with plans for Maze procedure to treat atrial fibrillation. Continue aggressive medical therapy.   01/01/16 TEE: Study Conclusions - Left ventricle: The cavity size was normal. There was mildconcentric hypertrophy. Systolic function was mildly reduced. Theestimated ejection fraction was in the range of 45% to 50%. - Aortic valve: No evidence of vegetation. - Mitral valve: No evidence of vegetation. - Left atrium: The atrium was severely dilated. No evidence ofthrombus in the atrial cavity or appendage. No spontaneous echocontrast was observed. - Right atrium: The atrium was severely dilated. -  Atrial septum: There was a small (6 mm diameter) high secundumatrial septal defect. Doppler showed a moderate left-to-rightatrial level shunt, in the baseline state. - Tricuspid valve: No evidence of vegetation. - Pulmonic valve: No evidence of vegetation.   Neuro/Psych  Headaches, PSYCHIATRIC DISORDERS Anxiety    GI/Hepatic Neg liver ROS, hiatal hernia, GERD  Medicated and Controlled,  Endo/Other  negative endocrine ROS  Renal/GU negative Renal ROS     Musculoskeletal  (+) Arthritis ,   Abdominal   Peds  Hematology  (+) Blood dyscrasia (Xarelto), ,   Anesthesia Other Findings Day of surgery medications reviewed with the patient.  Reproductive/Obstetrics negative OB ROS                            Anesthesia Physical Anesthesia Plan  ASA: IV  Anesthesia Plan: General   Post-op Pain Management:    Induction: Intravenous  Airway Management Planned: Video Laryngoscope Planned and Double Lumen EBT  Additional Equipment: Arterial line, TEE, CVP, Ultrasound Guidance Line Placement and 3D TEE  Intra-op Plan:   Post-operative Plan: Possible Post-op intubation/ventilation  Informed Consent: I have reviewed the patients History and Physical, chart, labs and discussed the procedure including the risks, benefits and alternatives for the proposed anesthesia with the patient or authorized representative who has indicated his/her understanding and acceptance.   Dental advisory given  Plan Discussed with: CRNA  Anesthesia Plan Comments: (Risks/benefits of general anesthesia discussed with patient including risk of damage to teeth, lips, gum, and tongue, nausea/vomiting, allergic reactions to medications, and the possibility of heart attack, stroke and death.  All patient questions answered.  Patient wishes to proceed.)  Anesthesia Quick Evaluation  

## 2016-05-27 NOTE — Progress Notes (Signed)
Anesthesia chart review: Patient is a 55 year old female scheduled for minimally invasive maze procedure, closure of atrial septal defect on 05/28/2016 by Dr. Roxy Manns.  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  PCP is Dr. Colin Benton. Cardiologist is Dr. Rayann Heman.  BP (!) 119/57   Pulse (!) 104   Temp 36.5 C   Resp 20   Ht 5\' 11"  (1.803 m)   Wt 277 lb 3.2 oz (125.7 kg)   LMP 04/17/2012   SpO2 97%   BMI 38.66 kg/m   Meds include amiodarone (start one week prior to surgery), Toprol-XL, fish oil, Protonix, tramadol, Xarelto (held since 05/21/16).  05/26/16 EKG: Aflutter at 113 bpm with variable A-V block. HR 104 bpm with vitals.  04/10/16 RHC/LHC:  There is mild left ventricular systolic dysfunction.  The left ventricular ejection fraction is 45% by visual estimate.  LV end diastolic pressure is normal.  There is no aortic valve stenosis.  LV end diastolic pressure is normal.  There is no pulmonic valve stenosis.  PA sat 70%. CO 8 L/min. Cardiac Index 3.3.  No angiographically apparent coronary artery disease.  No AAA.  Continue with plans for Maze procedure to treat atrial fibrillation.   Continue aggressive medical therapy.   01/01/16 TEE: Study Conclusions - Left ventricle: The cavity size was normal. There was mild   concentric hypertrophy. Systolic function was mildly reduced. The   estimated ejection fraction was in the range of 45% to 50%. - Aortic valve: No evidence of vegetation. - Mitral valve: No evidence of vegetation. - Left atrium: The atrium was severely dilated. No evidence of   thrombus in the atrial cavity or appendage. No spontaneous echo   contrast was observed. - Right atrium: The atrium was severely dilated. - Atrial septum:  There was a small (6 mm diameter) high secundum   atrial septal defect. Doppler showed a moderate left-to-right   atrial level shunt, in the baseline state. - Tricuspid valve: No evidence of vegetation. - Pulmonic valve: No evidence of vegetation.  05/26/16 Carotid U/S (Preliminary): Bilateral -  No evidence of ICA stenosis. Vertebral artery flow is antegrade.  Preoperative cardiac CT, CTA chest/abd/pelvis, chest x-ray, PFTs, and labs noted.  If no acute changes then I anticipate that she can proceed as planned.  [facility] Short Stay Center/Anesthesiology Phone [phone] 05/27/2016 9:32 AM

## 2016-05-27 NOTE — H&P (Signed)
[address]       ,[place] [address]             [phone]          CARDIOTHORACIC SURGERY HISTORY AND PHYSICAL EXAM  Referring Provider is [name], MD PCP is Lucretia Kern., DO      Chief Complaint  Patient presents with  . Atrial Fibrillation    PERSISTENT...eval for MAZE    HPI:  Patient is a 55 year old moderately obese female with long-standing history of atrial fibrillation that has failed medical therapy on multiple agents and catheter-based ablation on 2 previous occasions who has been referred for surgical consultation to consider possible maze procedure.  The patient states that she first developed paroxysmal atrial fibrillation in 2008.  She has been followed for several years by Dr. Rayann Heman. She has failed Tikosyn therapy and catheter-based ablation 2 in 2013.  She is not felt to be a candidate for long term amiodarone therapy.  Over the last several years she has experienced continued progression of symptoms of recurrent palpitations, exertional shortness of breath, and fatigue. She has an implantable loop recorder which has documented increasing burden of atrial fibrillation and decreasing amounts of time when she remains in sinus rhythm, most recently with AF burden estimated 78%. 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 She has severe left and right atrial enlargement but no significant valvular heart disease. Transesophageal echocardiogram does reveal a small atrial septal defect which may or may not be related to previous transseptal puncture for catheter-based ablation.  The patient is married and lives locally in [place] with her husband and 2 children. 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 This does not require strenuous exertion but does require some traveling. She has been moderately  obese for the past 20 years and unsuccessful with attempts at weight loss. Her physical activity has been limited by problems with severe degenerative arthritis afflicting both knees and her hips. However, in recent months her activity has been limited primarily by exertional shortness of breath. She denies any history of exertional chest pain or chest tightness. She now gets short of breath with moderate level activity. She denies any history of PND, orthopnea, or lower extremity edema. She has frequent palpitations. Her heart rate goes up very quickly with attempts to exercise. The patient has been chronically anticoagulated using Pradaxa for years.  She has not had any complications related to long-term anticoagulation. The remainder of her review of systems is noncontributory.  Patient returns to the office today for follow-up of chronic persistent atrial fibrillation. She was originally seen in consultation on 01/27/2016 at which time long-term treatment strategies for management of atrial fibrillation were discussed. Since then she has been seen in follow-up by Dr. Rayann Heman on 03/18/2016.  She remained in atrial fibrillation at that time, and she has since then decided that she is interested in proceeding with elective Maze procedure. She underwent left and right heart catheterization on 04/10/2016. She did not have any significant coronary artery disease.  Left ventricular ejection fraction was estimated 45%. She did not have pulmonary hypertension. Oxygen saturation run to check for a step up and oxygen saturation and quantify shunt fraction related to her small atrial septal defect was apparently not performed.  The patient returns for office today with her husband. She reports no new problems or complaints. She complains of stable  chronic fatigue and exertional shortness of breath.  She continues to have chronic palpitations. She has not had chest pain, dizziness, or syncope.  She has been referred to  pulmonary medicine for a sleep study to test for the possibility of obstructive sleep apnea, but this has not yet been performed.  Patient is a 55 year old obese white female who returns to the office today for follow-up of chronic persistent atrial fibrillation with plans to proceed with elective minimally invasive Maze procedure and closure of atrial septal defect on 05/28/2016. She was last seen here in our office on 04/13/2016. Since then she has remained clinically stable. She reports no new problems or complaints. She continues to experience chronic symptoms of exertional shortness of breath and fatigue.  Past Medical History:  Diagnosis Date  . Allergic rhinitis   . Anxiety    when tachycardia occurs  . Arthritis    OA, s/p numerous surgeries -back, knees, hip  . Atrial flutter ([place])   . Atrial septal defect 01/01/2016   Discovered on TEE   . Benign fundic gland polyps of stomach   . Cervical disc disease   . Complication of anesthesia   . Diverticulitis    CT Scan   . DVT (deep venous thrombosis) ([place])    in pregnancy, s/p hip surgery  . Eustachian tube dysfunction   . GERD 12/23/2009   diet related-not a problem  . Headache(784.0)    migraines occ.  Marland Kitchen Hemorrhoids   . Hiatal hernia    small  . History of MRSA infection 2008   superficial skin-cleared easily with doxycycline  . Hypertension   . Obesity   . Peripheral vascular disease ([place])    left leg after hip surgery  . Persistent atrial fibrillation ([place]) 03/05/2007   a. s/p RFCA 12/18/2011, 07/29/12  . PONV (postoperative nausea and vomiting)   . Sialoadenitis   . SVT (supraventricular tachycardia) ([place]) 03/18/2010   PVCs    Past Surgical History:  Procedure Laterality Date  . ATRIAL FIBRILLATION ABLATION  07/29/2012   PVI by Dr Rayann Heman (second procedure)  . ATRIAL FIBRILLATION ABLATION N/A 12/18/2011   Procedure: ATRIAL FIBRILLATION ABLATION;  Surgeon: [name], MD;  Location[facility] CATH LAB;  Service:  Cardiovascular;  Laterality: N/A;  . ATRIAL FIBRILLATION ABLATION N/A 07/29/2012   Procedure: ATRIAL FIBRILLATION ABLATION;  Surgeon: [name], MD;  Location: [facility] CATH LAB;  Service: Cardiovascular;  Laterality: N/A;  . atrial fibrillation and atrial flutter ablation  12/17/11, 07/29/12   PVI and CTI ablations by Dr Rayann Heman  . CARDIAC CATHETERIZATION N/A 04/10/2016   Procedure: Right/Left Heart Cath and Coronary Angiography;  Surgeon: Jettie Booze, MD;  Location: [facility] CV LAB;  Service: Cardiovascular;  Laterality: N/A;  . CERVICAL FUSION    . CESAREAN SECTION     x 2  . CHOLECYSTECTOMY    . COLONOSCOPY  07/16/2011   diverticulosis  . ELBOW SURGERY     Right  . EP IMPLANTABLE DEVICE N/A 09/10/2015   Procedure: Loop Recorder Insertion;  Surgeon: [name], MD;  Location: [facility] CV LAB;  Service: Cardiovascular;  Laterality: N/A;  . FLEXIBLE SIGMOIDOSCOPY     1990's  . FOOT SURGERY     Right   . I&D KNEE WITH POLY EXCHANGE Left 12/12/2012   Procedure: LEFT KNEE EXCISION SAPHENOUS NEUROMA/OPEN SCAR DEBRIDEMENT/POLY EXCHANGE/NERVE EXCISION;  Surgeon: Mauri Pole, MD;  Location: [facility] ORS;  Service: Orthopedics;  Laterality: Left;  . JOINT REPLACEMENT Bilateral  4'14  knees  . KNEE SURGERY     x 9 Left Knee  . SHOULDER SURGERY     Right   . TEE WITHOUT CARDIOVERSION  12/17/2011   Procedure: TRANSESOPHAGEAL ECHOCARDIOGRAM (TEE);  Surgeon: Larey Dresser, MD;  Location: [facility] ENDOSCOPY;  Service: Cardiovascular;  Laterality: N/A;  . TEE WITHOUT CARDIOVERSION  07/28/2012   Procedure: TRANSESOPHAGEAL ECHOCARDIOGRAM (TEE);  Surgeon: Thayer Headings, MD;  Location: [facility];  Service: Cardiovascular;  Laterality: N/A;  . TEE WITHOUT CARDIOVERSION N/A 01/01/2016   Procedure: TRANSESOPHAGEAL ECHOCARDIOGRAM (TEE);  Surgeon: Sanda Klein, MD;  Location: [facility];  Service: Cardiovascular;  Laterality: N/A;  . TOTAL HIP ARTHROPLASTY Left 10/17/2013   Procedure: LEFT TOTAL  HIP ARTHROPLASTY ANTERIOR APPROACH;  Surgeon: Mauri Pole, MD;  Location: [facility];  Service: Orthopedics;  Laterality: Left;  . TOTAL KNEE ARTHROPLASTY Right 12/12/2012   Procedure: RIGHT TOTAL KNEE ARTHROPLASTY;  Surgeon: Mauri Pole, MD;  Location: [facility];  Service: Orthopedics;  Laterality: Right;  . UPPER GASTROINTESTINAL ENDOSCOPY  01/30/2010   hiatal hernia, fundic gland polyps  . WRIST SURGERY     Left     Family History  Problem Relation Age of Onset  . Diabetes Maternal Grandfather   . Diverticulitis Brother     x 2  . Diverticulitis Paternal Grandmother   . Colon cancer Neg Hx     Social History Social History  Substance Use Topics  . Smoking status: Never Smoker  . Smokeless tobacco: Never Used  . Alcohol use Yes     Comment: rare social    Prior to Admission medications   Medication Sig Start Date End Date Taking? Authorizing Provider  acetaminophen (TYLENOL) 500 MG tablet Take 1,000 mg by mouth every 6 (six) hours as needed (For pain.).    Yes Historical Provider, MD  amiodarone (PACERONE) 200 MG tablet Take 1 tablet (200 mg total) by mouth 2 (two) times daily. 05/18/16  Yes Rexene Alberts, MD  amoxicillin (AMOXIL) 500 MG capsule Take 2,000 mg by mouth daily as needed. Take 1 hour prior to dental appointment. 11/19/15  Yes Historical Provider, MD  diazepam (VALIUM) 5 MG tablet Take 5 mg by mouth at bedtime.    Yes Historical Provider, MD  diclofenac sodium (VOLTAREN) 1 % GEL Apply 2 g topically 2 (two) times daily as needed for pain. 05/13/16  Yes Historical Provider, MD  ketotifen (ZADITOR) 0.025 % ophthalmic solution Place 1 drop into both eyes 2 (two) times daily as needed (allergies/ itching).   Yes Historical Provider, MD  metoprolol succinate (TOPROL-XL) 100 MG 24 hr tablet Take 1 tablet (100 mg total) by mouth 2 (two) times daily. Take with or immediately following a meal. 04/02/16  Yes [name], MD  pantoprazole (PROTONIX) 40 MG tablet TAKE 1 TABLET (40 MG  TOTAL) BY MOUTH DAILY AS NEEDED (HEARTBURN). 11/19/15  Yes Lucretia Kern, DO  PAZEO 0.7 % SOLN Place 1 drop into both eyes daily as needed. For allergy eyes 04/17/16  Yes Historical Provider, MD  traMADol (ULTRAM) 50 MG tablet Take 50 mg by mouth daily as needed. For pain. 05/13/16  Yes Historical Provider, MD  XARELTO 20 MG TABS tablet TAKE 1 TABLET BY MOUTH AT BEDTIME 02/14/16  Yes [name], MD  celecoxib (CELEBREX) 200 MG capsule Take 200 mg by mouth daily. 05/13/16   Historical Provider, MD  Multiple Vitamin (MULTIVITAMIN WITH MINERALS) TABS tablet Take 1 tablet by mouth daily.  Historical Provider, MD  Omega-3 Fatty Acids (FISH OIL PO) Take 1 capsule by mouth daily.     Historical Provider, MD    Allergies  Allergen Reactions  . Avelox [Moxifloxacin Hcl In Nacl] Other (See Comments)    Numbness & tingling Peripheral neuropathy     Review of Systems:              General:                      normal appetite, decreased energy, no weight gain, no weight loss, no fever             Cardiac:                       no chest pain with exertion, no chest pain at rest, +SOB with exertion, no resting SOB, no PND, no orthopnea, + palpitations, + arrhythmia, + atrial fibrillation, no LE edema, + dizzy spells, no syncope             Respiratory:                 no shortness of breath, no home oxygen, no productive cough, no dry cough, no bronchitis, no wheezing, no hemoptysis, no asthma, no pain with inspiration or cough, no sleep apnea, no CPAP at night             GI:                               no difficulty swallowing, + reflux, no frequent heartburn, + small hiatal hernia, no abdominal pain, no constipation, no diarrhea, no hematochezia, no hematemesis, no melena             GU:                              no dysuria,  no frequency, no urinary tract infection, no hematuria, no kidney stones, no kidney disease             Vascular:                     no pain suggestive of claudication, no  pain in feet, no leg cramps, no varicose veins, + DVT following hip replacement surgery, no non-healing foot ulcer             Neuro:                         no stroke, no TIA's, no seizures, no headaches, no temporary blindness one eye,  no slurred speech, no peripheral neuropathy, no chronic pain, no instability of gait, no memory/cognitive dysfunction             Musculoskeletal:         + arthritis, + joint swelling, no myalgias, + mild difficulty walking, normal mobility              Skin:                            no rash, no itching, no skin infections, no pressure sores or ulcerations             Psych:  no anxiety, no depression, no nervousness, no unusual recent stress             Eyes:                           no blurry vision, no floaters, no recent vision changes, + wears glasses or contacts             ENT:                            no hearing loss, no loose or painful teeth, no dentures             Hematologic:               no easy bruising, no abnormal bleeding, no clotting disorder, no frequent epistaxis             Endocrine:                   no diabetes, does not check CBG's at home                           Physical Exam:              BP 142/97 mmHg  Pulse 84  Resp 16  Ht 5\' 11"  (1.803 m)  Wt 266 lb (120.657 kg)  BMI 37.12 kg/m2  SpO2 98%  LMP 04/17/2012             General:                      Moderately obese,  well-appearing             HEENT:                       Unremarkable              Neck:                           no JVD, no bruits, no adenopathy              Chest:                          clear to auscultation, symmetrical breath sounds, no wheezes, no rhonchi              CV:                              Irregular rate and rhythm, no murmur              Abdomen:                    soft, non-tender, no masses \             Extremities:                 warm, well-perfused, pulses diminished, no LE edema             Rectal/GU                    Deferred  Neuro:                         Grossly non-focal and symmetrical throughout             Skin:                            Clean and dry, no rashes, no breakdown   Diagnostic Tests:  Transthoracic Echocardiography  Patient: Colee, Drye MR #: [id number] Study Date: 12/24/2015 Gender: F Age: 38 Height: 180.3 cm Weight: 124.5 kg BSA: 2.55 m^2 Pt. Status: Room:  ATTENDING [name], MD ORDERING [name], MD [place], MD SONOGRAPHER Cindy Hazy, RDCS PERFORMING [facility], Outpatient  cc:  ------------------------------------------------------------------- LV EF: 40% - 45%  ------------------------------------------------------------------- Indications: I48.91 Atrial Fibrillation.  ------------------------------------------------------------------- History: PMH: Acquired from the patient and from the patient&'s chart. PMH: Persistent Atrial Fibrillation. Atrial Flutter. SVT. DVT.  ------------------------------------------------------------------- Study Conclusions  - Left ventricle: The cavity size was mildly dilated. Wall  thickness was normal. Systolic function was mildly to moderately  reduced. The estimated ejection fraction was in the range of 40%  to 45%. There is hypokinesis of the anteroseptal myocardium. - Left atrium: The atrium was severely dilated.  Impressions:  - Anteroseptal hypokinesis with mild to moderate LV dysfunction;  moderate diastolic dysfunction; severe LAE; oscillating density  in right atrium noted on apical 4 chamber view; suggest TEE to  further assess.  ------------------------------------------------------------------- Labs, prior tests, procedures, and surgery: Loop Recorder. Transthoracic echocardiography. M-mode, complete 2D, spectral Doppler, and color Doppler.  Birthdate: Patient birthdate: 19-Jan-1961. Age: Patient is 55 yr old. Sex: Gender: female. BMI: 38.3 kg/m^2. Blood pressure: 124/82 Patient status: Outpatient. Study date: Study date: 12/24/2015. Study time: 08:35 AM. Location: [facility]  -------------------------------------------------------------------  ------------------------------------------------------------------- Left ventricle: The cavity size was mildly dilated. Wall thickness was normal. Systolic function was mildly to moderately reduced. The estimated ejection fraction was in the range of 40% to 45%. Regional wall motion abnormalities: There is hypokinesis of the anteroseptal myocardium. Findings consistent with left ventricular diastolic dysfunction.  ------------------------------------------------------------------- Aortic valve: Trileaflet; mildly thickened leaflets. Mobility was not restricted. Doppler: Transvalvular velocity was within the normal range. There was no stenosis. There was no regurgitation.  ------------------------------------------------------------------- Aorta: Aortic root: The aortic root was normal in size.  ------------------------------------------------------------------- Mitral valve: Structurally normal valve. Mobility was not restricted. Doppler: Transvalvular velocity was within the normal range. There was no evidence for stenosis. There was trivial regurgitation. Peak gradient (D): 3 mm Hg.  ------------------------------------------------------------------- Left atrium: The atrium was severely dilated.  ------------------------------------------------------------------- Right ventricle: The cavity size was normal. Systolic function was normal.  ------------------------------------------------------------------- Pulmonic valve: Doppler: Transvalvular velocity was within the normal range. There was no evidence for stenosis.  There was mild regurgitation.  ------------------------------------------------------------------- Tricuspid valve: Structurally normal valve. Doppler: Transvalvular velocity was within the normal range. There was trivial regurgitation.  ------------------------------------------------------------------- Right atrium: The atrium was normal in size.  ------------------------------------------------------------------- Pericardium: There was no pericardial effusion.  ------------------------------------------------------------------- Systemic veins: Inferior vena cava: The vessel was normal in size.  ------------------------------------------------------------------- Measurements  Left ventricle Value Reference LV ID, ED, PLAX chordal (H) 53.9 mm 43 - 52 LV ID, ES, PLAX chordal (H) 40 mm 23 - 38 LV fx shortening, PLAX chordal (L) 26 % >=29 LV PW thickness, ED 9.16 mm --------- IVS/LV PW ratio, ED 1.09 <=1.3 Stroke volume, 2D 92 ml --------- Stroke volume/bsa, 2D 36  ml/m^2 --------- LV e&', lateral 12 cm/s --------- LV E/e&', lateral 7.64 --------- LV e&', medial 9.68 cm/s --------- LV E/e&', medial 9.47 --------- LV e&', average 10.84 cm/s --------- LV E/e&', average 8.46 ---------  Ventricular septum Value Reference IVS thickness, ED 9.96 mm ---------  LVOT Value Reference LVOT ID, S 24 mm --------- LVOT  area 4.52 cm^2 --------- LVOT ID 24 mm --------- LVOT peak velocity, S 93.8 cm/s --------- LVOT mean velocity, S 63.5 cm/s --------- LVOT VTI, S 20.3 cm --------- Stroke volume (SV), LVOT DP 91.8 ml --------- Stroke index (SV/bsa), LVOT DP 36.1 ml/m^2 ---------  Aorta Value Reference Aortic root ID, ED 35 mm ---------  Left atrium Value Reference LA ID, A-P, ES 59 mm --------- LA ID/bsa, A-P (H) 2.32 cm/m^2 <=2.2 LA volume, S 102 ml --------- LA volume/bsa, S 40.1 ml/m^2 --------- LA volume, ES, 1-p A4C 93.6 ml --------- LA volume/bsa, ES, 1-p A4C 36.8 ml/m^2 --------- LA volume, ES, 1-p A2C 108 ml --------- LA volume/bsa, ES, 1-p A2C 42.4 ml/m^2 ---------  Mitral valve Value Reference Mitral E-wave peak velocity 91.7 cm/s --------- Mitral A-wave peak velocity 44.6 cm/s --------- Mitral deceleration time 169 ms 150 - 230 Mitral peak gradient, D 3 mm Hg --------- Mitral E/A ratio, peak 2.1 ---------  Right ventricle Value Reference TAPSE 21.9 mm --------- RV s&', lateral, S 10.4 cm/s ---------  Legend: (L) and (H) mark values outside specified reference range.  ------------------------------------------------------------------- Prepared and  Electronically Authenticated by  Kirk Ruths 2017-05-09T09:50:30   Transesophageal Echocardiography  Patient: Yula, Rostron MR #: [id number] Study Date: 01/01/2016 Gender: F Age: 38 Height: 180.3 cm Weight: 124.5 kg BSA: 2.55 m^2 Pt. Status: Room:  ADMITTING Loralie Champagne, M.D. ATTENDING Loralie Champagne, M.D. SONOGRAPHER Tresa Res, RDCS PERFORMING Sanda Klein, MD Emelia Loron, Amber K REFERRING Lynnell Jude, Amber K  cc:  ------------------------------------------------------------------- LV EF: 45% - 50%  ------------------------------------------------------------------- Indications: Atrial fibrillation - 427.31.  ------------------------------------------------------------------- Study Conclusions  - Left ventricle: The cavity size was normal. There was mild  concentric hypertrophy. Systolic function was mildly reduced. The  estimated ejection fraction was in the range of 45% to 50%. - Aortic valve: No evidence of vegetation. - Mitral valve: No evidence of vegetation. - Left atrium: The atrium was severely dilated. No evidence of  thrombus in the atrial cavity or appendage. No spontaneous echo  contrast was observed. - Right atrium: The atrium was severely dilated. - Atrial septum: There was a small (6 mm diameter) high secundum  atrial septal defect. Doppler showed a moderate left-to-right  atrial level shunt, in the baseline state. - Tricuspid valve: No evidence of vegetation. - Pulmonic valve: No evidence of vegetation.  Diagnostic transesophageal echocardiography. 2D and color Doppler. Birthdate: Patient birthdate: 03-May-1961. Age: Patient is 55 yr old. Sex: Gender: female. BMI: 38.3 kg/m^2. Blood pressure:  130/91 Patient status: Outpatient. Study date: Study date: 01/01/2016. Study time: 11:13 AM. Location:  Endoscopy.  -------------------------------------------------------------------  ------------------------------------------------------------------- Left ventricle: The cavity size was normal. There was mild concentric hypertrophy. Systolic function was mildly reduced. The estimated ejection fraction was in the range of 45% to 50%.  ------------------------------------------------------------------- Aortic valve: Structurally normal valve. Cusp separation was normal. No evidence of vegetation. Doppler: There was no regurgitation.  ------------------------------------------------------------------- Aorta: The aorta was normal, not dilated, and non-diseased.  ------------------------------------------------------------------- Mitral valve: Structurally normal valve. Leaflet separation was normal. No evidence of vegetation. Doppler: There was trivial regurgitation.  ------------------------------------------------------------------- Left atrium: The atrium was severely dilated. No evidence of thrombus in the atrial  cavity or appendage. No spontaneous echo contrast was observed. Emptying velocity was normal.  ------------------------------------------------------------------- Atrial septum: There was a small (6 mm diameter) high secundum atrial septal defect. Doppler showed a moderate left-to-right atrial level shunt, in the baseline state.  ------------------------------------------------------------------- Pulmonic valve: Structurally normal valve. Cusp separation was normal. No evidence of vegetation. Doppler: There was mild regurgitation.  ------------------------------------------------------------------- Tricuspid valve: Structurally normal valve. Leaflet separation was normal. No evidence of vegetation. Doppler: There was no regurgitation.  ------------------------------------------------------------------- Right atrium: The  atrium was severely dilated. The Eustachian valve appeared very large and prominent. It explains the mobile abnormality seen on transthoracic echo.  ------------------------------------------------------------------- Pericardium: There was no pericardial effusion.  ------------------------------------------------------------------- Post procedure conclusions Ascending Aorta:  - The aorta was normal, not dilated, and non-diseased.  ------------------------------------------------------------------- Measurements  LVOT Value LVOT ID, S 24.2 mm LVOT area 4.6 cm^2 LVOT peak velocity, S 79.67 cm/s LVOT VTI, S 12.13 cm Stroke volume (SV), LVOT DP 55.8 ml Stroke index (SV/bsa), LVOT DP 21.9 ml/m^2  RVOT Value RVOT ID, S 25.5 mm RVOT peak velocity, S 91.53 cm/s RVOT mean velocity, S 62.31 cm/s RVOT VTI, S 16.39 cm RVOT peak gradient, S 4 mm Hg RVOT mean gradient, S 2 mm Hg RVOT stroke volume, DP 83.7 ml Qp:Qs, from RVOT DP 1.5  Legend: (L) and (H) mark values outside specified reference range.  ------------------------------------------------------------------- Prepared and Electronically Authenticated by  Sanda Klein, MD 2017-05-17T18:27:09      Right/Left Heart Cath and Coronary Angiography  Conclusion     There is mild left ventricular systolic dysfunction.  The left ventricular ejection fraction is 45% by visual estimate.  LV end diastolic pressure is normal.  There is no aortic valve stenosis.  LV end diastolic pressure is normal.  There is no pulmonic valve stenosis.  PA sat 70%. CO 8 L/min. Cardiac Index 3.3.  No angiographically apparent  coronary artery disease.  No AAA.  Continue with plans for Maze procedure to treat atrial fibrillation. Continue aggressive medical therapy. Restart Xarelto tomorrow.   Indications   Persistent atrial fibrillation ([facility]) [I48.1 (ICD-10-CM)]  Cardiomyopathy ([place]) [I42.9 (ICD-10-CM)]  Procedural Details/Technique   Technical Details The risks, benefits, and details of the procedure were explained to the patient. The patient verbalized understanding and wanted to proceed. Informed written consent was obtained.  PROCEDURE TECHNIQUE: After Xylocaine anesthesia, a 5 French sheath was placed in the right brachial area in exchange for a peripheral IV. A 5 French balloontipped Swan-Ganz catheter was advanced to the pulmonary artery under fluoroscopic guidance. Hemodynamic pressures were obtained. Oxygen saturations were obtained. After Xylocaine anesthesia, a 27F sheath was placed in the right radial artery with a single anterior needle wall stick. Left coronary angiography was done using a Judkins L3.5 guide catheter. Right coronary angiography was done using a Judkins R4 guide catheter. Left heart cath/ventriculogram was done using a pigtail catheter. Abdominal aortogram was done using the pigtail catheter and power injection of contrast.    Contrast: 100    Estimated blood loss <50 mL. . During this procedure the patient was administered the following to achieve and maintain moderate conscious sedation: Versed 3 mg, Fentanyl 50 mcg, while the patient's heart rate, blood pressure, and oxygen saturation were continuously monitored. The period of conscious sedation was 51 minutes, of which I was present face-to-face 100% of this time.    Complications   Complications documented before study signed (04/10/2016 10:33 AM EDT)   No complications were associated with this study.  Documented  by Jettie Booze, MD - 04/10/2016 10:29 AM EDT    Coronary Findings   Dominance: Co-dominant   Left Main  Vessel was injected. Vessel is normal in caliber. Vessel is angiographically normal.  Left Anterior Descending  Vessel was injected. Vessel is normal in caliber. Vessel is angiographically normal.  Ramus Intermedius  Vessel was injected. Vessel is normal in caliber. Vessel is angiographically normal.  Left Circumflex  Vessel was injected. Vessel is normal in caliber. Vessel is angiographically normal.  Right Coronary Artery  Vessel was injected. Vessel is normal in caliber. Vessel is angiographically normal.  Right Heart   Right Heart Pressures LV EDP is normal.    Right Atrium Right atrial pressure is normal.    Pulmonic Valve There is no pulmonic valve stenosis.    Left Heart   Left Ventricle The left ventricular size is in the upper limits of normal. There is mild left ventricular systolic dysfunction. LV end diastolic pressure is normal. The left ventricular ejection fraction is 45-50% by visual estimate. There are LV function abnormalities due to global hypokinesis.    Aortic Valve There is no aortic valve stenosis.    Coronary Diagrams   Diagnostic Diagram     Implants        No implant documentation for this case.  PACS Images   Show images for Cardiac catheterization   Link to Procedure Log   Procedure Log    Hemo Data   Flowsheet Row Most Recent Value  Fick Cardiac Output 8.01 L/min  Fick Cardiac Output Index 3.37 (L/min)/BSA  Aortic Mean Gradient 3.8 mmHg  Aortic Peak Gradient 0 mmHg  Aortic Valve Area >3.50  Aortic Value Area Index 1.47 cm2/BSA  RA A Wave -99 mmHg  RA V Wave 12 mmHg  RA Mean 10 mmHg  RV Systolic Pressure 35 mmHg  RV Diastolic Pressure 2 mmHg  RV EDP 10 mmHg  PA Systolic Pressure 36 mmHg  PA Diastolic Pressure 16 mmHg  PA Mean 25 mmHg  PW A Wave -99 mmHg  PW V Wave 22 mmHg  PW Mean 19 mmHg  AO Systolic Pressure 123XX123 mmHg  AO Diastolic Pressure 76 mmHg  AO Mean 94 mmHg  LV Systolic Pressure 123456 mmHg  LV  Diastolic Pressure 4 mmHg  LV EDP 11 mmHg  Arterial Occlusion Pressure Extended Systolic Pressure [id number] mmHg  Arterial Occlusion Pressure Extended Diastolic Pressure 77 mmHg  Arterial Occlusion Pressure Extended Mean Pressure 101 mmHg  Left Ventricular Apex Extended Systolic Pressure [id number] mmHg  Left Ventricular Apex Extended Diastolic Pressure 4 mmHg  Left Ventricular Apex Extended EDP Pressure 11 mmHg  QP/QS 1  TPVR Index 7.42 HRUI  TSVR Index 27.91 HRUI  PVR SVR Ratio 0.07  TPVR/TSVR Ratio 0.27    CT ANGIOGRAPHY CHEST, ABDOMEN AND PELVIS  TECHNIQUE: Multidetector CT imaging through the chest, abdomen and pelvis was performed using the standard protocol during bolus administration of intravenous contrast. Multiplanar reconstructed images and MIPs were obtained and reviewed to evaluate the vascular anatomy.  CONTRAST: 75 cc Isovue 370  COMPARISON: CT 09/15/2013, 04/27/2011  FINDINGS: Vascular:  CHEST  Unremarkable course caliber and contour of the thoracic aorta. No dissection. No aneurysm. Greatest diameter of the ascending aorta measures approximately 3.5 cm.  Common origin of the innominate artery and left common carotid artery. Branch vessels remain patent.  No periaortic fluid.  No central, lobar, or proximal segmental pulmonary artery filling defects. Beyond this, pulmonary arterial tree not well evaluated.  Review  of the MIP images confirms the above findings.  ABDOMEN AND PELVIS  Aorta:  Unremarkable course caliber and contour of the abdominal aorta. Minimal aortic calcifications. No dissection flap. No aneurysm. No periaortic fluid.  Mesenteric vessels:  No significant plaque involving the celiac artery, superior mesenteric artery, or inferior mesenteric artery, which remain patent.  Renal arteries:  Left and right renal arteries remain patent. Accessory left renal artery just superior to the main left renal artery. No  accessory right renal arteries identified.  Right lower extremity:  Common iliac artery unremarkable in course caliber and contour.  Hypogastric artery remains patent including anterior and posterior division.  External iliac artery unremarkable with no significant plaque. No dissection or aneurysm.  Common femoral artery and the proximal profunda femoris and superficial femoral artery remain patent.  Left lower extremity:  Unremarkable course caliber and contour of the left common iliac artery without aneurysm dissection or occlusion.  Hypogastric artery remains patent and occluding anterior and posterior division.  Unremarkable appearance of the left external iliac artery with no aneurysm dissection flap or occlusion.  Left common femoral artery patent.  Proximal superficial femoral artery and profunda femoris are patent.  Nonvascular:  Chest:  Unremarkable appearance of the superficial soft tissues. Loop recorder in position on left chest wall.  No lymphadenopathy.  Unremarkable appearance of the thoracic inlet.  Heart size borderline enlarged. No pericardial fluid/ thickening.  Moderate-sized hiatal hernia. No mediastinal lymphadenopathy.  Respiratory motion somewhat limits evaluation of the lungs. No confluent airspace disease. No pneumothorax or pleural effusion. Central airways are clear.  No displaced fractures. No significant bony canal narrowing of the thoracic spine.  Abdomen/pelvis:  Unremarkable spleen.  Focal fatty infiltration in segment 4 adjacent to the falciform ligament.  Nonspecific hypervascular lesion measuring 16 mm within the right liver lobe, segment 6. This is unchanged from the comparison CT of 09/15/2013 and of 2012.  Cholecystectomy.  Unremarkable pancreas.  Unremarkable bilateral adrenal glands.  Unremarkable bilateral kidneys. Unremarkable course the bilateral ureters.  Unremarkable  urinary bladder.  Unremarkable appearance of the uterus and adnexa.  Unremarkable appearance of small bowel.  Colonic diverticular present throughout the length of the colon. No associated inflammatory changes.  Appendix is not visualized, however, no inflammatory changes are present adjacent to the cecum to indicate an appendicitis.  Moderate-sized hiatal hernia.  No displaced fracture. Mild degenerative changes of the lumbar spine. No significant bony canal narrowing.  Surgical changes of left hip arthroplasty.  Review of the MIP images confirms the above findings.  IMPRESSION: No acute finding of the chest, abdomen, pelvis CT angiogram.  Minimal aortic atherosclerosis.  Moderate size hiatal hernia.  Colonic diverticula without acute findings of diverticulitis.  Hypervascular lesion within the right liver lobe unchanged from comparison studies, most compatible with benign vascular lesion.  Signed,  Dulcy Fanny. Earleen Newport, DO  [facility]  [facility]   Electronically Signed By: Corrie Mckusick D.O. On: 04/06/2016 11:07    Impression:  Patient has a long history of chronic atrial fibrillation dating back to 2008. She has failed medical therapy with Tikosyn and is not felt to be a candidate for long-term amiodarone therapy. She has failed catheter-based ablation on 2 previous occasions. Recently she has remained in continuous persistent atrial fibrillation and she complains of worsening symptoms of palpitations, exertional shortness of breath, fatigue, and decreased exercise tolerance. 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 She also has a small atrial septal defect, presumably from her previous ablation procedures.  Diagnostic cardiac catheterization is notable for the absence of significant coronary artery disease. Options include long-term medical therapy versus another attempt at catheter-based ablation versus surgical Maze procedure. CT angiogram of the aorta and iliac vessels revealed no contraindication to peripheral cannulation for surgery.   Plan:  I have again reviewed the indications, risks, and potential benefits of Maze procedure and closure of her atrial septal defect with the patient and her husband in the office today. Alternative approaches have been discussed including continued medical therapy versus a variety of interventional techniques. Alternative surgical approaches have been discussed including a comparison of techniques both with or without use of cardiopulmonary bypass. The mini thoracotomy approach for Maze procedure was discussed and compared with conventional median sternotomy. The expected likelihood of long term freedom from recurrent symptomatic atrial fibrillation and/or atrial flutter following surgery has been discussed.  Expectations for the patient's postoperative convalescence have been discussed. All of their questions have been addressed.    The patient understands and accepts all potential associated risks of surgery including but not limited to risk of death, stroke, myocardial infarction, congestive heart failure, respiratory failure, renal failure, bleeding requiring blood transfusion and/or reexploration, arrhythmia, heart block or bradycardia requiring permanent pacemaker, pneumonia, pleural effusion, wound infection, pulmonary embolus or other thromboembolic complication, chronic pain or other delayed complications.  Alternative surgical approaches have been discussed including a comparison between conventional sternotomy and minimally-invasive techniques.  The relative risks and benefits of each have been reviewed as they pertain to the patient's specific  circumstances, and all of their questions have been addressed.  Specific risks potentially related to the minimally-invasive approach were discussed at length, including but not limited to risk of conversion to full or partial sternotomy, aortic dissection or other major vascular complication, unilateral acute lung injury or pulmonary edema, phrenic nerve dysfunction or paralysis, rib fracture, chronic pain, lung hernia, or lymphocele.   All questions answered.  The patient has been instructed to stop taking Xarelto 1 week prior to surgery. We discussed whether or not to use Lovenox injections as a bridge and have decided not to do so. The patient has been given a prescription for amiodarone to begin 1 week prior to surgery.   I spent in excess of 30 minutes during the conduct of this office consultation and >50% of this time involved direct face-to-face encounter with the patient for counseling and/or coordination of their care.   Valentina Gu. Roxy Manns, MD 05/18/2016 2:03 PM

## 2016-05-28 ENCOUNTER — Inpatient Hospital Stay (HOSPITAL_COMMUNITY): Payer: Managed Care, Other (non HMO) | Admitting: Vascular Surgery

## 2016-05-28 ENCOUNTER — Inpatient Hospital Stay (HOSPITAL_COMMUNITY)
Admission: RE | Admit: 2016-05-28 | Discharge: 2016-06-09 | DRG: 229 | Disposition: A | Discharge status: Home / Self Care | Payer: Managed Care, Other (non HMO) | Source: Ambulatory Visit | Attending: Thoracic Surgery (Cardiothoracic Vascular Surgery) | Admitting: Thoracic Surgery (Cardiothoracic Vascular Surgery)

## 2016-05-28 ENCOUNTER — Encounter (HOSPITAL_COMMUNITY): Payer: Self-pay | Admitting: *Deleted

## 2016-05-28 ENCOUNTER — Inpatient Hospital Stay (HOSPITAL_COMMUNITY): Payer: Managed Care, Other (non HMO) | Admitting: Anesthesiology

## 2016-05-28 ENCOUNTER — Encounter (HOSPITAL_COMMUNITY)
Admission: RE | Disposition: A | Discharge status: Home / Self Care | Payer: Self-pay | Source: Ambulatory Visit | Attending: Thoracic Surgery (Cardiothoracic Vascular Surgery)

## 2016-05-28 ENCOUNTER — Inpatient Hospital Stay (HOSPITAL_COMMUNITY): Payer: Managed Care, Other (non HMO)

## 2016-05-28 DIAGNOSIS — D62 Acute posthemorrhagic anemia: Secondary | ICD-10-CM | POA: Diagnosis not present

## 2016-05-28 DIAGNOSIS — I739 Peripheral vascular disease, unspecified: Secondary | ICD-10-CM | POA: Diagnosis present

## 2016-05-28 DIAGNOSIS — Z9889 Other specified postprocedural states: Secondary | ICD-10-CM

## 2016-05-28 DIAGNOSIS — Z96653 Presence of artificial knee joint, bilateral: Secondary | ICD-10-CM | POA: Diagnosis present

## 2016-05-28 DIAGNOSIS — I471 Supraventricular tachycardia, unspecified: Secondary | ICD-10-CM | POA: Diagnosis present

## 2016-05-28 DIAGNOSIS — Z9689 Presence of other specified functional implants: Secondary | ICD-10-CM

## 2016-05-28 DIAGNOSIS — I481 Persistent atrial fibrillation: Principal | ICD-10-CM | POA: Diagnosis present

## 2016-05-28 DIAGNOSIS — K449 Diaphragmatic hernia without obstruction or gangrene: Secondary | ICD-10-CM | POA: Diagnosis present

## 2016-05-28 DIAGNOSIS — J939 Pneumothorax, unspecified: Secondary | ICD-10-CM | POA: Diagnosis not present

## 2016-05-28 DIAGNOSIS — I5042 Chronic combined systolic (congestive) and diastolic (congestive) heart failure: Secondary | ICD-10-CM | POA: Diagnosis present

## 2016-05-28 DIAGNOSIS — I110 Hypertensive heart disease with heart failure: Secondary | ICD-10-CM | POA: Diagnosis present

## 2016-05-28 DIAGNOSIS — E876 Hypokalemia: Secondary | ICD-10-CM | POA: Diagnosis not present

## 2016-05-28 DIAGNOSIS — I252 Old myocardial infarction: Secondary | ICD-10-CM

## 2016-05-28 DIAGNOSIS — Z6838 Body mass index (BMI) 38.0-38.9, adult: Secondary | ICD-10-CM | POA: Diagnosis not present

## 2016-05-28 DIAGNOSIS — Z7901 Long term (current) use of anticoagulants: Secondary | ICD-10-CM

## 2016-05-28 DIAGNOSIS — K219 Gastro-esophageal reflux disease without esophagitis: Secondary | ICD-10-CM | POA: Diagnosis present

## 2016-05-28 DIAGNOSIS — Z79899 Other long term (current) drug therapy: Secondary | ICD-10-CM | POA: Diagnosis not present

## 2016-05-28 DIAGNOSIS — J9811 Atelectasis: Secondary | ICD-10-CM | POA: Diagnosis not present

## 2016-05-28 DIAGNOSIS — I4891 Unspecified atrial fibrillation: Secondary | ICD-10-CM | POA: Diagnosis present

## 2016-05-28 DIAGNOSIS — I480 Paroxysmal atrial fibrillation: Secondary | ICD-10-CM | POA: Diagnosis not present

## 2016-05-28 DIAGNOSIS — M62838 Other muscle spasm: Secondary | ICD-10-CM | POA: Diagnosis not present

## 2016-05-28 DIAGNOSIS — Z981 Arthrodesis status: Secondary | ICD-10-CM

## 2016-05-28 DIAGNOSIS — Z96642 Presence of left artificial hip joint: Secondary | ICD-10-CM | POA: Diagnosis present

## 2016-05-28 DIAGNOSIS — I429 Cardiomyopathy, unspecified: Secondary | ICD-10-CM | POA: Diagnosis present

## 2016-05-28 DIAGNOSIS — I4819 Other persistent atrial fibrillation: Secondary | ICD-10-CM | POA: Diagnosis present

## 2016-05-28 DIAGNOSIS — Z86718 Personal history of other venous thrombosis and embolism: Secondary | ICD-10-CM

## 2016-05-28 DIAGNOSIS — Z8774 Personal history of (corrected) congenital malformations of heart and circulatory system: Secondary | ICD-10-CM

## 2016-05-28 DIAGNOSIS — Q211 Atrial septal defect, unspecified: Secondary | ICD-10-CM

## 2016-05-28 DIAGNOSIS — I428 Other cardiomyopathies: Secondary | ICD-10-CM

## 2016-05-28 DIAGNOSIS — F419 Anxiety disorder, unspecified: Secondary | ICD-10-CM | POA: Diagnosis present

## 2016-05-28 DIAGNOSIS — I469 Cardiac arrest, cause unspecified: Secondary | ICD-10-CM | POA: Diagnosis not present

## 2016-05-28 DIAGNOSIS — I495 Sick sinus syndrome: Secondary | ICD-10-CM | POA: Diagnosis not present

## 2016-05-28 DIAGNOSIS — Z8679 Personal history of other diseases of the circulatory system: Secondary | ICD-10-CM

## 2016-05-28 DIAGNOSIS — E669 Obesity, unspecified: Secondary | ICD-10-CM | POA: Diagnosis present

## 2016-05-28 DIAGNOSIS — I440 Atrioventricular block, first degree: Secondary | ICD-10-CM | POA: Diagnosis present

## 2016-05-28 DIAGNOSIS — Z95818 Presence of other cardiac implants and grafts: Secondary | ICD-10-CM

## 2016-05-28 DIAGNOSIS — I4892 Unspecified atrial flutter: Secondary | ICD-10-CM | POA: Diagnosis not present

## 2016-05-28 DIAGNOSIS — R0602 Shortness of breath: Secondary | ICD-10-CM

## 2016-05-28 DIAGNOSIS — I2510 Atherosclerotic heart disease of native coronary artery without angina pectoris: Secondary | ICD-10-CM

## 2016-05-28 DIAGNOSIS — M1990 Unspecified osteoarthritis, unspecified site: Secondary | ICD-10-CM | POA: Diagnosis present

## 2016-05-28 DIAGNOSIS — R569 Unspecified convulsions: Secondary | ICD-10-CM | POA: Diagnosis not present

## 2016-05-28 DIAGNOSIS — I5022 Chronic systolic (congestive) heart failure: Secondary | ICD-10-CM | POA: Diagnosis not present

## 2016-05-28 DIAGNOSIS — T502X5A Adverse effect of carbonic-anhydrase inhibitors, benzothiadiazides and other diuretics, initial encounter: Secondary | ICD-10-CM | POA: Diagnosis not present

## 2016-05-28 HISTORY — DX: Personal history of (corrected) congenital malformations of heart and circulatory system: Z87.74

## 2016-05-28 HISTORY — DX: Sick sinus syndrome: I49.5

## 2016-05-28 HISTORY — DX: Other specified postprocedural states: Z98.890

## 2016-05-28 HISTORY — DX: Personal history of other diseases of the circulatory system: Z86.79

## 2016-05-28 HISTORY — PX: TEE WITHOUT CARDIOVERSION: SHX5443

## 2016-05-28 HISTORY — PX: ASD REPAIR: SHX258

## 2016-05-28 HISTORY — PX: MAZE: SHX5063

## 2016-05-28 HISTORY — PX: CLIPPING OF ATRIAL APPENDAGE: SHX5773

## 2016-05-28 HISTORY — DX: Other specified postprocedural states: Z86.79

## 2016-05-28 HISTORY — PX: MEDIASTERNOTOMY: SHX5084

## 2016-05-28 LAB — POCT I-STAT, CHEM 8
BUN: 17 mg/dL (ref 6–20)
BUN: 14 mg/dL (ref 6–20)
BUN: 16 mg/dL (ref 6–20)
BUN: 16 mg/dL (ref 6–20)
BUN: 16 mg/dL (ref 6–20)
BUN: 15 mg/dL (ref 6–20)
CALCIUM ION: 1.24 mmol/L (ref 1.15–1.40)
CALCIUM ION: 1.21 mmol/L (ref 1.15–1.40)
CALCIUM ION: 1.07 mmol/L — AB (ref 1.15–1.40)
CALCIUM ION: 1.07 mmol/L — AB (ref 1.15–1.40)
CALCIUM ION: 1.17 mmol/L (ref 1.15–1.40)
CHLORIDE: 105 mmol/L (ref 101–111)
CREATININE: 0.7 mg/dL (ref 0.44–1.00)
CREATININE: 0.5 mg/dL (ref 0.44–1.00)
CREATININE: 0.6 mg/dL (ref 0.44–1.00)
Calcium, Ion: 1.04 mmol/L — ABNORMAL LOW (ref 1.15–1.40)
Chloride: 103 mmol/L (ref 101–111)
Chloride: 102 mmol/L (ref 101–111)
Chloride: 102 mmol/L (ref 101–111)
Chloride: 103 mmol/L (ref 101–111)
Chloride: 102 mmol/L (ref 101–111)
Creatinine, Ser: 0.5 mg/dL (ref 0.44–1.00)
Creatinine, Ser: 0.8 mg/dL (ref 0.44–1.00)
Creatinine, Ser: 0.8 mg/dL (ref 0.44–1.00)
GLUCOSE: 114 mg/dL — AB (ref 65–99)
GLUCOSE: 102 mg/dL — AB (ref 65–99)
GLUCOSE: 111 mg/dL — AB (ref 65–99)
GLUCOSE: 191 mg/dL — AB (ref 65–99)
GLUCOSE: 129 mg/dL — AB (ref 65–99)
Glucose, Bld: 193 mg/dL — ABNORMAL HIGH (ref 65–99)
HCT: 29 % — ABNORMAL LOW (ref 36.0–46.0)
HCT: 26 % — ABNORMAL LOW (ref 36.0–46.0)
HCT: 27 % — ABNORMAL LOW (ref 36.0–46.0)
HCT: 33 % — ABNORMAL LOW (ref 36.0–46.0)
HEMATOCRIT: 33 % — AB (ref 36.0–46.0)
HEMATOCRIT: 26 % — AB (ref 36.0–46.0)
HEMOGLOBIN: 8.8 g/dL — AB (ref 12.0–15.0)
HEMOGLOBIN: 9.9 g/dL — AB (ref 12.0–15.0)
HEMOGLOBIN: 8.8 g/dL — AB (ref 12.0–15.0)
Hemoglobin: 11.2 g/dL — ABNORMAL LOW (ref 12.0–15.0)
Hemoglobin: 9.2 g/dL — ABNORMAL LOW (ref 12.0–15.0)
Hemoglobin: 11.2 g/dL — ABNORMAL LOW (ref 12.0–15.0)
POTASSIUM: 4 mmol/L (ref 3.5–5.1)
Potassium: 3.9 mmol/L (ref 3.5–5.1)
Potassium: 3.8 mmol/L (ref 3.5–5.1)
Potassium: 4.6 mmol/L (ref 3.5–5.1)
Potassium: 4.3 mmol/L (ref 3.5–5.1)
Potassium: 4.8 mmol/L (ref 3.5–5.1)
SODIUM: 139 mmol/L (ref 135–145)
Sodium: 140 mmol/L (ref 135–145)
Sodium: 139 mmol/L (ref 135–145)
Sodium: 140 mmol/L (ref 135–145)
Sodium: 137 mmol/L (ref 135–145)
Sodium: 139 mmol/L (ref 135–145)
TCO2: 25 mmol/L (ref 0–100)
TCO2: 26 mmol/L (ref 0–100)
TCO2: 27 mmol/L (ref 0–100)
TCO2: 29 mmol/L (ref 0–100)
TCO2: 21 mmol/L (ref 0–100)
TCO2: 24 mmol/L (ref 0–100)

## 2016-05-28 LAB — POCT I-STAT 3, ART BLOOD GAS (G3+)
ACID-BASE DEFICIT: 1 mmol/L (ref 0.0–2.0)
ACID-BASE EXCESS: 1 mmol/L (ref 0.0–2.0)
Acid-base deficit: 5 mmol/L — ABNORMAL HIGH (ref 0.0–2.0)
Acid-base deficit: 2 mmol/L (ref 0.0–2.0)
BICARBONATE: 26.4 mmol/L (ref 20.0–28.0)
BICARBONATE: 20.9 mmol/L (ref 20.0–28.0)
BICARBONATE: 24.2 mmol/L (ref 20.0–28.0)
Bicarbonate: 22.4 mmol/L (ref 20.0–28.0)
O2 SAT: 98 %
O2 SAT: 94 %
O2 Saturation: 100 %
O2 Saturation: 97 %
PCO2 ART: 40.9 mmHg (ref 32.0–48.0)
PCO2 ART: 38.8 mmHg (ref 32.0–48.0)
PH ART: 7.32 — AB (ref 7.350–7.450)
PO2 ART: 122 mmHg — AB (ref 83.0–108.0)
PO2 ART: 75 mmHg — AB (ref 83.0–108.0)
Patient temperature: 37.8
Patient temperature: 37.6
TCO2: 28 mmol/L (ref 0–100)
TCO2: 22 mmol/L (ref 0–100)
TCO2: 25 mmol/L (ref 0–100)
TCO2: 24 mmol/L (ref 0–100)
pCO2 arterial: 42.8 mmHg (ref 32.0–48.0)
pCO2 arterial: 42 mmHg (ref 32.0–48.0)
pH, Arterial: 7.398 (ref 7.350–7.450)
pH, Arterial: 7.373 (ref 7.350–7.450)
pH, Arterial: 7.373 (ref 7.350–7.450)
pO2, Arterial: 412 mmHg — ABNORMAL HIGH (ref 83.0–108.0)
pO2, Arterial: 97 mmHg (ref 83.0–108.0)

## 2016-05-28 LAB — PROTIME-INR
INR: 1.28
PROTHROMBIN TIME: 16.1 s — AB (ref 11.4–15.2)

## 2016-05-28 LAB — CBC
HCT: 34.9 % — ABNORMAL LOW (ref 36.0–46.0)
HEMATOCRIT: 32.9 % — AB (ref 36.0–46.0)
HEMOGLOBIN: 11.5 g/dL — AB (ref 12.0–15.0)
HEMOGLOBIN: 10.8 g/dL — AB (ref 12.0–15.0)
MCH: 28.4 pg (ref 26.0–34.0)
MCH: 28.6 pg (ref 26.0–34.0)
MCHC: 33 g/dL (ref 30.0–36.0)
MCHC: 32.8 g/dL (ref 30.0–36.0)
MCV: 86.2 fL (ref 78.0–100.0)
MCV: 87.3 fL (ref 78.0–100.0)
PLATELETS: 268 10*3/uL (ref 150–400)
PLATELETS: 272 10*3/uL (ref 150–400)
RBC: 4.05 MIL/uL (ref 3.87–5.11)
RBC: 3.77 MIL/uL — AB (ref 3.87–5.11)
RDW: 13.7 % (ref 11.5–15.5)
RDW: 14 % (ref 11.5–15.5)
WBC: 24.4 10*3/uL — ABNORMAL HIGH (ref 4.0–10.5)
WBC: 19.3 10*3/uL — AB (ref 4.0–10.5)

## 2016-05-28 LAB — CREATININE, SERUM: Creatinine, Ser: 0.87 mg/dL (ref 0.44–1.00)

## 2016-05-28 LAB — GLUCOSE, CAPILLARY
GLUCOSE-CAPILLARY: 124 mg/dL — AB (ref 65–99)
Glucose-Capillary: 120 mg/dL — ABNORMAL HIGH (ref 65–99)
Glucose-Capillary: 114 mg/dL — ABNORMAL HIGH (ref 65–99)
Glucose-Capillary: 117 mg/dL — ABNORMAL HIGH (ref 65–99)
Glucose-Capillary: 112 mg/dL — ABNORMAL HIGH (ref 65–99)
Glucose-Capillary: 132 mg/dL — ABNORMAL HIGH (ref 65–99)
Glucose-Capillary: 120 mg/dL — ABNORMAL HIGH (ref 65–99)
Glucose-Capillary: 136 mg/dL — ABNORMAL HIGH (ref 65–99)

## 2016-05-28 LAB — POCT I-STAT 4, (NA,K, GLUC, HGB,HCT)
Glucose, Bld: 126 mg/dL — ABNORMAL HIGH (ref 65–99)
HEMATOCRIT: 34 % — AB (ref 36.0–46.0)
HEMOGLOBIN: 11.6 g/dL — AB (ref 12.0–15.0)
Potassium: 3.9 mmol/L (ref 3.5–5.1)
Sodium: 139 mmol/L (ref 135–145)

## 2016-05-28 LAB — HEMOGLOBIN AND HEMATOCRIT, BLOOD
HCT: 26.6 % — ABNORMAL LOW (ref 36.0–46.0)
HEMOGLOBIN: 9 g/dL — AB (ref 12.0–15.0)

## 2016-05-28 LAB — MAGNESIUM: Magnesium: 3.1 mg/dL — ABNORMAL HIGH (ref 1.7–2.4)

## 2016-05-28 LAB — APTT: aPTT: 33 seconds (ref 24–36)

## 2016-05-28 LAB — PLATELET COUNT: Platelets: 277 10*3/uL (ref 150–400)

## 2016-05-28 LAB — PREPARE RBC (CROSSMATCH)

## 2016-05-28 SURGERY — ECHOCARDIOGRAM, TRANSESOPHAGEAL
Anesthesia: General | Site: Chest

## 2016-05-28 MED ORDER — PHENYLEPHRINE HCL 10 MG/ML IJ SOLN
INTRAVENOUS | Status: DC | PRN
  Administered 2016-05-28: 40 ug/min via INTRAVENOUS

## 2016-05-28 MED ORDER — MIDAZOLAM HCL 2 MG/2ML IJ SOLN
INTRAMUSCULAR | Status: AC
  Filled 2016-05-28: qty 2

## 2016-05-28 MED ORDER — ROCURONIUM BROMIDE 100 MG/10ML IV SOLN
INTRAVENOUS | Status: DC | PRN
  Administered 2016-05-28 (×4): 50 mg via INTRAVENOUS
  Administered 2016-05-28: 40 mg via INTRAVENOUS
  Administered 2016-05-28: 50 mg via INTRAVENOUS

## 2016-05-28 MED ORDER — ALBUMIN HUMAN 5 % IV SOLN
250.0000 mL | INTRAVENOUS | Status: AC | PRN
  Administered 2016-05-28 (×3): 250 mL via INTRAVENOUS
  Filled 2016-05-28: qty 250

## 2016-05-28 MED ORDER — LACTATED RINGERS IV SOLN
INTRAVENOUS | Status: DC | PRN
  Administered 2016-05-28 (×2): via INTRAVENOUS

## 2016-05-28 MED ORDER — LACTATED RINGERS IV SOLN: INTRAVENOUS | Status: DC

## 2016-05-28 MED ORDER — PHENYLEPHRINE HCL 10 MG/ML IJ SOLN
0.0000 ug/min | INTRAVENOUS | Status: DC
  Administered 2016-05-29: 35 ug/min via INTRAVENOUS
  Filled 2016-05-28 (×3): qty 2

## 2016-05-28 MED ORDER — DEXMEDETOMIDINE HCL IN NACL 200 MCG/50ML IV SOLN
0.0000 ug/kg/h | INTRAVENOUS | Status: DC
  Filled 2016-05-28 (×2): qty 50

## 2016-05-28 MED ORDER — FENTANYL CITRATE (PF) 250 MCG/5ML IJ SOLN
INTRAMUSCULAR | Status: DC | PRN
  Administered 2016-05-28 (×2): 50 ug via INTRAVENOUS
  Administered 2016-05-28: 100 ug via INTRAVENOUS
  Administered 2016-05-28: 250 ug via INTRAVENOUS
  Administered 2016-05-28: 50 ug via INTRAVENOUS
  Administered 2016-05-28: 250 ug via INTRAVENOUS

## 2016-05-28 MED ORDER — LACTATED RINGERS IV SOLN
INTRAVENOUS | Status: DC | PRN
  Administered 2016-05-28: 07:00:00 via INTRAVENOUS

## 2016-05-28 MED ORDER — ACETAMINOPHEN 500 MG PO TABS
1000.0000 mg | ORAL_TABLET | Freq: Four times a day (QID) | ORAL | Status: AC
  Administered 2016-05-29 – 2016-06-02 (×19): 1000 mg via ORAL
  Filled 2016-05-28 (×19): qty 2

## 2016-05-28 MED ORDER — ROCURONIUM BROMIDE 10 MG/ML (PF) SYRINGE
PREFILLED_SYRINGE | INTRAVENOUS | Status: AC
  Filled 2016-05-28: qty 10

## 2016-05-28 MED ORDER — DEXAMETHASONE SODIUM PHOSPHATE 10 MG/ML IJ SOLN
INTRAMUSCULAR | Status: DC | PRN
  Administered 2016-05-28: 10 mg via INTRAVENOUS

## 2016-05-28 MED ORDER — SODIUM CHLORIDE 0.9 % IV SOLN
INTRAVENOUS | Status: AC
  Administered 2016-05-28: 13:00:00 via INTRAVENOUS

## 2016-05-28 MED ORDER — SODIUM CHLORIDE 0.45 % IV SOLN
INTRAVENOUS | Status: DC | PRN
  Administered 2016-05-28: 13:00:00 via INTRAVENOUS

## 2016-05-28 MED ORDER — MIDAZOLAM HCL 2 MG/2ML IJ SOLN
2.0000 mg | INTRAMUSCULAR | Status: DC | PRN
  Administered 2016-05-28: 2 mg via INTRAVENOUS

## 2016-05-28 MED ORDER — VANCOMYCIN HCL 1000 MG IV SOLR
INTRAVENOUS | Status: AC
  Administered 2016-05-28: 1000 mL
  Filled 2016-05-28: qty 1000

## 2016-05-28 MED ORDER — FENTANYL CITRATE (PF) 250 MCG/5ML IJ SOLN
INTRAMUSCULAR | Status: AC
  Filled 2016-05-28: qty 20

## 2016-05-28 MED ORDER — SUCCINYLCHOLINE CHLORIDE 20 MG/ML IJ SOLN
INTRAMUSCULAR | Status: DC | PRN
  Administered 2016-05-28: 100 mg via INTRAVENOUS

## 2016-05-28 MED ORDER — ONDANSETRON HCL 4 MG/2ML IJ SOLN
4.0000 mg | Freq: Four times a day (QID) | INTRAMUSCULAR | Status: DC | PRN
  Administered 2016-06-03 – 2016-06-05 (×3): 4 mg via INTRAVENOUS
  Filled 2016-05-28 (×4): qty 2

## 2016-05-28 MED ORDER — LACTATED RINGERS IV SOLN: 500.0000 mL | Freq: Once | INTRAVENOUS | Status: DC | PRN

## 2016-05-28 MED ORDER — HEPARIN SODIUM (PORCINE) 1000 UNIT/ML IJ SOLN
INTRAMUSCULAR | Status: DC | PRN
  Administered 2016-05-28: 37 mL via INTRAVENOUS

## 2016-05-28 MED ORDER — LIDOCAINE HCL (PF) 1 % IJ SOLN
INTRAMUSCULAR | Status: AC
  Administered 2016-05-28: 15:00:00
  Filled 2016-05-28: qty 5

## 2016-05-28 MED ORDER — PANTOPRAZOLE SODIUM 40 MG PO TBEC
40.0000 mg | DELAYED_RELEASE_TABLET | Freq: Every day | ORAL | Status: DC
  Administered 2016-05-30 – 2016-06-09 (×10): 40 mg via ORAL
  Filled 2016-05-28 (×10): qty 1

## 2016-05-28 MED ORDER — SODIUM CHLORIDE 0.9% FLUSH
3.0000 mL | Freq: Two times a day (BID) | INTRAVENOUS | Status: DC
  Administered 2016-05-29 – 2016-05-30 (×3): 3 mL via INTRAVENOUS

## 2016-05-28 MED ORDER — DEXTROSE 5 % IV SOLN
1.5000 g | Freq: Two times a day (BID) | INTRAVENOUS | Status: AC
  Administered 2016-05-29 – 2016-05-30 (×4): 1.5 g via INTRAVENOUS
  Filled 2016-05-28 (×4): qty 1.5

## 2016-05-28 MED ORDER — METOPROLOL TARTRATE 5 MG/5ML IV SOLN
2.5000 mg | INTRAVENOUS | Status: DC | PRN
  Administered 2016-05-30 – 2016-05-31 (×8): 5 mg via INTRAVENOUS
  Administered 2016-06-02: 2.5 mg via INTRAVENOUS
  Filled 2016-05-28 (×9): qty 5

## 2016-05-28 MED ORDER — PROPOFOL 10 MG/ML IV BOLUS
INTRAVENOUS | Status: DC | PRN
  Administered 2016-05-28: 100 mg via INTRAVENOUS

## 2016-05-28 MED ORDER — TRAMADOL HCL 50 MG PO TABS
50.0000 mg | ORAL_TABLET | ORAL | Status: DC | PRN
  Administered 2016-06-05: 100 mg via ORAL
  Filled 2016-05-28: qty 2

## 2016-05-28 MED ORDER — MORPHINE SULFATE (PF) 2 MG/ML IV SOLN
1.0000 mg | INTRAVENOUS | Status: DC | PRN
  Administered 2016-05-28: 2 mg via INTRAVENOUS
  Administered 2016-05-28: 4 mg via INTRAVENOUS
  Filled 2016-05-28: qty 2
  Filled 2016-05-28: qty 1

## 2016-05-28 MED ORDER — MIDAZOLAM HCL 5 MG/5ML IJ SOLN
INTRAMUSCULAR | Status: DC | PRN
  Administered 2016-05-28: 1 mg via INTRAVENOUS
  Administered 2016-05-28 (×2): 2 mg via INTRAVENOUS

## 2016-05-28 MED ORDER — SODIUM CHLORIDE 0.9 % IV SOLN: INTRAVENOUS | Status: DC

## 2016-05-28 MED ORDER — VANCOMYCIN HCL IN DEXTROSE 1-5 GM/200ML-% IV SOLN
1000.0000 mg | Freq: Once | INTRAVENOUS | Status: AC
  Administered 2016-05-28: 1000 mg via INTRAVENOUS
  Filled 2016-05-28: qty 200

## 2016-05-28 MED ORDER — ASPIRIN 81 MG PO CHEW: 324.0000 mg | CHEWABLE_TABLET | Freq: Every day | ORAL | Status: DC

## 2016-05-28 MED ORDER — SODIUM CHLORIDE 0.9% FLUSH: 3.0000 mL | INTRAVENOUS | Status: DC | PRN

## 2016-05-28 MED ORDER — PROPOFOL 10 MG/ML IV BOLUS
INTRAVENOUS | Status: AC
  Filled 2016-05-28: qty 40

## 2016-05-28 MED ORDER — OXYCODONE HCL 5 MG PO TABS
5.0000 mg | ORAL_TABLET | ORAL | Status: DC | PRN
  Administered 2016-05-28 – 2016-06-01 (×11): 10 mg via ORAL
  Administered 2016-06-02: 5 mg via ORAL
  Administered 2016-06-02 – 2016-06-03 (×2): 10 mg via ORAL
  Administered 2016-06-03: 5 mg via ORAL
  Administered 2016-06-04 – 2016-06-07 (×10): 10 mg via ORAL
  Administered 2016-06-07: 5 mg via ORAL
  Administered 2016-06-08 (×2): 10 mg via ORAL
  Administered 2016-06-08: 5 mg via ORAL
  Administered 2016-06-09: 10 mg via ORAL
  Filled 2016-05-28 (×9): qty 2
  Filled 2016-05-28: qty 1
  Filled 2016-05-28 (×10): qty 2
  Filled 2016-05-28: qty 1
  Filled 2016-05-28 (×6): qty 2
  Filled 2016-05-28: qty 1
  Filled 2016-05-28: qty 2
  Filled 2016-05-28: qty 1

## 2016-05-28 MED ORDER — TRANEXAMIC ACID 1000 MG/10ML IV SOLN
INTRAVENOUS | Status: DC
  Filled 2016-05-28: qty 1

## 2016-05-28 MED ORDER — MIDAZOLAM HCL 10 MG/2ML IJ SOLN
INTRAMUSCULAR | Status: AC
  Filled 2016-05-28: qty 2

## 2016-05-28 MED ORDER — ACETAMINOPHEN 160 MG/5ML PO SOLN: 1000.0000 mg | Freq: Four times a day (QID) | ORAL | Status: DC

## 2016-05-28 MED ORDER — ACETAMINOPHEN 650 MG RE SUPP
650.0000 mg | Freq: Once | RECTAL | Status: AC
  Administered 2016-05-28: 650 mg via RECTAL

## 2016-05-28 MED ORDER — 0.9 % SODIUM CHLORIDE (POUR BTL) OPTIME
TOPICAL | Status: DC | PRN
  Administered 2016-05-28: 6000 mL

## 2016-05-28 MED ORDER — TRANEXAMIC ACID 1000 MG/10ML IV SOLN
INTRAVENOUS | Status: DC
  Filled 2016-05-28: qty 10

## 2016-05-28 MED ORDER — DIPHENHYDRAMINE HCL 50 MG/ML IJ SOLN
INTRAMUSCULAR | Status: DC | PRN
  Administered 2016-05-28: 25 mg via INTRAVENOUS

## 2016-05-28 MED ORDER — INSULIN REGULAR HUMAN 100 UNIT/ML IJ SOLN
INTRAMUSCULAR | Status: DC | PRN
  Administered 2016-05-28: 1 [IU]/h via INTRAVENOUS

## 2016-05-28 MED ORDER — CHLORHEXIDINE GLUCONATE 0.12 % MT SOLN
15.0000 mL | OROMUCOSAL | Status: AC
  Administered 2016-05-29: 15 mL via OROMUCOSAL

## 2016-05-28 MED ORDER — FAMOTIDINE IN NACL 20-0.9 MG/50ML-% IV SOLN
20.0000 mg | Freq: Two times a day (BID) | INTRAVENOUS | Status: DC
  Administered 2016-05-28: 20 mg via INTRAVENOUS

## 2016-05-28 MED ORDER — ASPIRIN EC 325 MG PO TBEC
325.0000 mg | DELAYED_RELEASE_TABLET | Freq: Every day | ORAL | Status: DC
  Administered 2016-05-29 – 2016-06-02 (×5): 325 mg via ORAL
  Filled 2016-05-28 (×5): qty 1

## 2016-05-28 MED ORDER — ONDANSETRON HCL 4 MG/2ML IJ SOLN
INTRAMUSCULAR | Status: AC
  Filled 2016-05-28: qty 2

## 2016-05-28 MED ORDER — INSULIN REGULAR BOLUS VIA INFUSION
0.0000 [IU] | Freq: Three times a day (TID) | INTRAVENOUS | Status: DC
  Filled 2016-05-28: qty 10

## 2016-05-28 MED ORDER — METOPROLOL TARTRATE 25 MG/10 ML ORAL SUSPENSION: 12.5000 mg | Freq: Two times a day (BID) | ORAL | Status: DC

## 2016-05-28 MED ORDER — MAGNESIUM SULFATE 4 GM/100ML IV SOLN
4.0000 g | Freq: Once | INTRAVENOUS | Status: AC
  Administered 2016-05-28: 4 g via INTRAVENOUS
  Filled 2016-05-28: qty 100

## 2016-05-28 MED ORDER — BISACODYL 5 MG PO TBEC
10.0000 mg | DELAYED_RELEASE_TABLET | Freq: Every day | ORAL | Status: DC
  Administered 2016-05-29 – 2016-06-03 (×4): 10 mg via ORAL
  Filled 2016-05-28 (×4): qty 2

## 2016-05-28 MED ORDER — POTASSIUM CHLORIDE 10 MEQ/50ML IV SOLN
10.0000 meq | INTRAVENOUS | Status: AC
  Administered 2016-05-28 (×3): 10 meq via INTRAVENOUS

## 2016-05-28 MED ORDER — DOCUSATE SODIUM 100 MG PO CAPS
200.0000 mg | ORAL_CAPSULE | Freq: Every day | ORAL | Status: DC
  Administered 2016-05-29 – 2016-06-03 (×4): 200 mg via ORAL
  Filled 2016-05-28 (×4): qty 2

## 2016-05-28 MED ORDER — MORPHINE SULFATE (PF) 2 MG/ML IV SOLN
1.0000 mg | INTRAVENOUS | Status: DC | PRN
  Administered 2016-05-28 – 2016-05-31 (×7): 2 mg via INTRAVENOUS
  Filled 2016-05-28 (×8): qty 1

## 2016-05-28 MED ORDER — NITROGLYCERIN IN D5W 200-5 MCG/ML-% IV SOLN: 0.0000 ug/min | INTRAVENOUS | Status: DC

## 2016-05-28 MED ORDER — SODIUM CHLORIDE 0.9 % IV SOLN
INTRAVENOUS | Status: DC | PRN
  Administered 2016-05-28: 0.2 ug/kg/h via INTRAVENOUS

## 2016-05-28 MED ORDER — ONDANSETRON HCL 4 MG/2ML IJ SOLN
INTRAMUSCULAR | Status: DC | PRN
  Administered 2016-05-28: 4 mg via INTRAVENOUS

## 2016-05-28 MED ORDER — CHLORHEXIDINE GLUCONATE 4 % EX LIQD: 30.0000 mL | CUTANEOUS | Status: DC

## 2016-05-28 MED ORDER — PROTAMINE SULFATE 10 MG/ML IV SOLN
INTRAVENOUS | Status: DC | PRN
  Administered 2016-05-28 (×6): 40 mg via INTRAVENOUS
  Administered 2016-05-28 (×2): 30 mg via INTRAVENOUS

## 2016-05-28 MED ORDER — ACETAMINOPHEN 160 MG/5ML PO SOLN: 650.0000 mg | Freq: Once | ORAL | Status: AC

## 2016-05-28 MED ORDER — SODIUM CHLORIDE 0.9 % IV SOLN: 250.0000 mL | INTRAVENOUS | Status: DC

## 2016-05-28 MED ORDER — SODIUM CHLORIDE 0.9 % IV SOLN
INTRAVENOUS | Status: DC
  Administered 2016-05-28: 2.4 [IU]/h via INTRAVENOUS
  Filled 2016-05-28 (×2): qty 2.5

## 2016-05-28 MED ORDER — BISACODYL 10 MG RE SUPP: 10.0000 mg | Freq: Every day | RECTAL | Status: DC

## 2016-05-28 MED ORDER — SODIUM CHLORIDE 0.9 % IJ SOLN
OROMUCOSAL | Status: DC | PRN
  Administered 2016-05-28 (×6): via TOPICAL

## 2016-05-28 MED ORDER — ALBUMIN HUMAN 5 % IV SOLN
INTRAVENOUS | Status: DC | PRN
  Administered 2016-05-28: 12:00:00 via INTRAVENOUS

## 2016-05-28 MED ORDER — METOPROLOL TARTRATE 12.5 MG HALF TABLET: 12.5000 mg | ORAL_TABLET | Freq: Two times a day (BID) | ORAL | Status: DC

## 2016-05-28 MED FILL — Potassium Chloride Inj 2 mEq/ML: INTRAVENOUS | Qty: 40 | Status: AC

## 2016-05-28 MED FILL — Magnesium Sulfate Inj 50%: INTRAMUSCULAR | Qty: 10 | Status: AC

## 2016-05-28 MED FILL — Heparin Sodium (Porcine) Inj 1000 Unit/ML: INTRAMUSCULAR | Qty: 30 | Status: AC

## 2016-05-28 SURGICAL SUPPLY — 138 items
ADAPTER CARDIO PERF ANTE/RETRO (ADAPTER) ×10 IMPLANT
ADH SKN CLS APL DERMABOND .7 (GAUZE/BANDAGES/DRESSINGS) ×3
ADPR PRFSN 84XANTGRD RTRGD (ADAPTER) ×6
ATTRACTOMAT 16X20 MAGNETIC DRP (DRAPES) ×3 IMPLANT
BAG DECANTER FOR FLEXI CONT (MISCELLANEOUS) ×12 IMPLANT
BLADE STERNUM SYSTEM 6 (BLADE) ×5 IMPLANT
BLADE SURG 11 STRL SS (BLADE) ×8 IMPLANT
BLADE SURG ROTATE 9660 (MISCELLANEOUS) IMPLANT
CANISTER SUCTION 2500CC (MISCELLANEOUS) ×15 IMPLANT
CANNULA AORTIC ROOT 20012 (MISCELLANEOUS) ×4 IMPLANT
CANNULA AORTIC ROOT 9FR (CANNULA) ×3 IMPLANT
CANNULA ARTERIAL NVNT 3/8 22FR (MISCELLANEOUS) IMPLANT
CANNULA EZ GLIDE AORTIC 21FR (CANNULA) ×3 IMPLANT
CANNULA FEM VENOUS REMOTE 22FR (CANNULA) ×2 IMPLANT
CANNULA FEMORAL ART 14 SM (MISCELLANEOUS) ×5 IMPLANT
CANNULA GUNDRY RCSP 15FR (MISCELLANEOUS) ×7 IMPLANT
CANNULA OPTISITE PERFUSION 16F (CANNULA) IMPLANT
CANNULA OPTISITE PERFUSION 18F (CANNULA) IMPLANT
CANNULA SUMP PERICARDIAL (CANNULA) ×10 IMPLANT
CANNULA VRC MALLEABLE 20FR (CANNULA) ×2 IMPLANT
CARDIOBLATE CARDIAC ABLATION (MISCELLANEOUS)
CATH CPB KIT OWEN (MISCELLANEOUS) ×3 IMPLANT
CATH HEART VENT LEFT (CATHETERS) ×3 IMPLANT
CATH ROBINSON RED A/P 18FR (CATHETERS) ×14 IMPLANT
CATH THORACIC 36FR (CATHETERS) ×5 IMPLANT
CATH THORACIC 36FR RT ANG (CATHETERS) ×3 IMPLANT
CLAMP OLL ABLATION (MISCELLANEOUS) ×3 IMPLANT
CONN 1/2X1/2X1/2  BEN (MISCELLANEOUS) ×2
CONN 1/2X1/2X1/2 BEN (MISCELLANEOUS) ×3 IMPLANT
CONN 3/8X1/2 ST GISH (MISCELLANEOUS) ×10 IMPLANT
CONN ST 1/4X3/8  BEN (MISCELLANEOUS) ×4
CONN ST 1/4X3/8 BEN (MISCELLANEOUS) ×6 IMPLANT
CONNECTOR 1/2X3/8X1/2 3 WAY (MISCELLANEOUS) ×2
CONNECTOR 1/2X3/8X1/2 3WAY (MISCELLANEOUS) ×3 IMPLANT
COVER BACK TABLE 24X17X13 BIG (DRAPES) ×5 IMPLANT
COVER PROBE W GEL 5X96 (DRAPES) ×3 IMPLANT
CRADLE DONUT ADULT HEAD (MISCELLANEOUS) ×10 IMPLANT
DERMABOND ADVANCED (GAUZE/BANDAGES/DRESSINGS) ×2
DERMABOND ADVANCED .7 DNX12 (GAUZE/BANDAGES/DRESSINGS) ×6 IMPLANT
DEVICE CARDIOBLATE CARDIAC ABL (MISCELLANEOUS) IMPLANT
DEVICE TROCAR PUNCTURE CLOSURE (ENDOMECHANICALS) ×5 IMPLANT
DRAIN CHANNEL 28F RND 3/8 FF (WOUND CARE) ×10 IMPLANT
DRAIN CHANNEL 32F RND 10.7 FF (WOUND CARE) ×4 IMPLANT
DRAPE BILATERAL SPLIT (DRAPES) ×5 IMPLANT
DRAPE C-ARM 42X72 X-RAY (DRAPES) ×5 IMPLANT
DRAPE CARDIOVASCULAR INCISE (DRAPES)
DRAPE CV SPLIT W-CLR ANES SCRN (DRAPES) ×5 IMPLANT
DRAPE INCISE IOBAN 66X45 STRL (DRAPES) ×10 IMPLANT
DRAPE SLUSH/WARMER DISC (DRAPES) ×10 IMPLANT
DRAPE SRG 135X102X78XABS (DRAPES) ×3 IMPLANT
DRSG AQUACEL AG ADV 3.5X14 (GAUZE/BANDAGES/DRESSINGS) ×2 IMPLANT
DRSG COVADERM 4X14 (GAUZE/BANDAGES/DRESSINGS) ×5 IMPLANT
DRSG COVADERM 4X8 (GAUZE/BANDAGES/DRESSINGS) ×3 IMPLANT
ELECT BLADE 6.5 EXT (BLADE) ×5 IMPLANT
ELECT REM PT RETURN 9FT ADLT (ELECTROSURGICAL) ×20
ELECTRODE REM PT RTRN 9FT ADLT (ELECTROSURGICAL) ×12 IMPLANT
FEMORAL VENOUS CANN RAP (CANNULA) IMPLANT
GAUZE SPONGE 4X4 12PLY STRL (GAUZE/BANDAGES/DRESSINGS) ×15 IMPLANT
GLOVE ORTHO TXT STRL SZ7.5 (GLOVE) ×30 IMPLANT
GOWN STRL REUS W/ TWL LRG LVL3 (GOWN DISPOSABLE) ×24 IMPLANT
GOWN STRL REUS W/TWL LRG LVL3 (GOWN DISPOSABLE) ×40
HEMOSTAT POWDER SURGIFOAM 1G (HEMOSTASIS) ×17 IMPLANT
INSERT FOGARTY XLG (MISCELLANEOUS) ×5 IMPLANT
KIT BASIN OR (CUSTOM PROCEDURE TRAY) ×10 IMPLANT
KIT DILATOR VASC 18G NDL (KITS) ×5 IMPLANT
KIT DRAINAGE VACCUM ASSIST (KITS) ×2 IMPLANT
KIT ROOM TURNOVER OR (KITS) ×10 IMPLANT
KIT SUCTION CATH 14FR (SUCTIONS) ×14 IMPLANT
LEAD PACING MYOCARDI (MISCELLANEOUS) ×10 IMPLANT
LINE VENT (MISCELLANEOUS) ×3 IMPLANT
LOOP VESSEL SUPERMAXI WHITE (MISCELLANEOUS) ×2 IMPLANT
NDL AORTIC ROOT 14G 7F (CATHETERS) ×3 IMPLANT
NEEDLE AORTIC ROOT 14G 7F (CATHETERS) ×5 IMPLANT
NS IRRIG 1000ML POUR BTL (IV SOLUTION) ×52 IMPLANT
PACK OPEN HEART (CUSTOM PROCEDURE TRAY) ×10 IMPLANT
PAD ARMBOARD 7.5X6 YLW CONV (MISCELLANEOUS) ×20 IMPLANT
PAD ELECT DEFIB RADIOL ZOLL (MISCELLANEOUS) ×5 IMPLANT
PROBE CRYO2-ABLATION MALLABLE (MISCELLANEOUS) ×3 IMPLANT
RETRACTOR TRL SOFT TISSUE LG (INSTRUMENTS) IMPLANT
RETRACTOR TRM SOFT TISSUE 7.5 (INSTRUMENTS) IMPLANT
SET CANNULATION TOURNIQUET (MISCELLANEOUS) ×5 IMPLANT
SET CARDIOPLEGIA MPS 5001102 (MISCELLANEOUS) ×2 IMPLANT
SET IRRIG TUBING LAPAROSCOPIC (IRRIGATION / IRRIGATOR) ×5 IMPLANT
SOLUTION ANTI FOG 6CC (MISCELLANEOUS) ×5 IMPLANT
SPONGE GAUZE 4X4 12PLY STER LF (GAUZE/BANDAGES/DRESSINGS) ×2 IMPLANT
SPONGE LAP 4X18 X RAY DECT (DISPOSABLE) ×2 IMPLANT
SUCKER INTRACARDIAC WEIGHTED (SUCKER) ×7 IMPLANT
SUT BONE WAX W31G (SUTURE) ×10 IMPLANT
SUT E-PACK MINIMALLY INVASIVE (SUTURE) ×5 IMPLANT
SUT ETHIBOND (SUTURE) IMPLANT
SUT ETHIBOND 2 0 SH (SUTURE) ×5 IMPLANT
SUT ETHIBOND 2 0 SH 36X2 (SUTURE) IMPLANT
SUT ETHIBOND 2 0 V4 (SUTURE) IMPLANT
SUT ETHIBOND 2 0V4 GREEN (SUTURE) IMPLANT
SUT ETHIBOND 2-0 RB-1 WHT (SUTURE) IMPLANT
SUT ETHIBOND 4 0 TF (SUTURE) IMPLANT
SUT ETHIBOND 5 0 C 1 30 (SUTURE) IMPLANT
SUT ETHIBOND NAB MH 2-0 36IN (SUTURE) IMPLANT
SUT ETHIBOND X763 2 0 SH 1 (SUTURE) ×22 IMPLANT
SUT GORETEX 6.0 TH-9 30 IN (SUTURE) IMPLANT
SUT GORETEX CV 4 TH 22 36 (SUTURE) ×5 IMPLANT
SUT GORETEX CV-5THC-13 36IN (SUTURE) IMPLANT
SUT GORETEX CV4 TH-18 (SUTURE) ×10 IMPLANT
SUT GORETEX TH-18 36 INCH (SUTURE) IMPLANT
SUT MNCRL AB 3-0 PS2 18 (SUTURE) ×10 IMPLANT
SUT MNCRL AB 4-0 PS2 18 (SUTURE) ×10 IMPLANT
SUT PDS AB 1 CTX 36 (SUTURE) ×16 IMPLANT
SUT PROLENE 3 0 SH DA (SUTURE) ×14 IMPLANT
SUT PROLENE 3 0 SH1 36 (SUTURE) ×9 IMPLANT
SUT PROLENE 4 0 RB 1 (SUTURE) ×20
SUT PROLENE 4-0 RB1 .5 CRCL 36 (SUTURE) ×6 IMPLANT
SUT PROLENE 5 0 CC1 (SUTURE) IMPLANT
SUT SILK  1 MH (SUTURE) ×2
SUT SILK 1 MH (SUTURE) ×3 IMPLANT
SUT SILK 2 0 SH CR/8 (SUTURE) ×2 IMPLANT
SUT STEEL 6MS V (SUTURE) IMPLANT
SUT VIC AB 1 CTX 18 (SUTURE) ×2 IMPLANT
SUT VIC AB 2-0 CTX 27 (SUTURE) IMPLANT
SUT VIC AB 3-0 X1 27 (SUTURE) IMPLANT
SUTURE E-PAK OPEN HEART (SUTURE) ×5 IMPLANT
SYRINGE 10CC LL (SYRINGE) ×5 IMPLANT
SYS ARTICLIP LAA EXCLUSION 150 (Clip) ×2 IMPLANT
SYSTEM SAHARA CHEST DRAIN ATS (WOUND CARE) ×15 IMPLANT
TAPE CLOTH SURG 4X10 WHT LF (GAUZE/BANDAGES/DRESSINGS) ×2 IMPLANT
TOWEL OR 17X24 6PK STRL BLUE (TOWEL DISPOSABLE) ×15 IMPLANT
TOWEL OR 17X26 10 PK STRL BLUE (TOWEL DISPOSABLE) ×20 IMPLANT
TRAY FOLEY IC TEMP SENS 14FR (CATHETERS) ×5 IMPLANT
TRAY FOLEY IC TEMP SENS 16FR (CATHETERS) ×5 IMPLANT
TROCAR XCEL BLADELESS 5X75MML (TROCAR) ×5 IMPLANT
TROCAR XCEL NON-BLD 11X100MML (ENDOMECHANICALS) ×10 IMPLANT
TUBE SUCT INTRACARD DLP 20F (MISCELLANEOUS) ×5 IMPLANT
TUBING INSUFFLATION 10FT LAP (TUBING) ×5 IMPLANT
TUNNELER SHEATH ON-Q 11GX8 DSP (PAIN MANAGEMENT) IMPLANT
UNDERPAD 30X30 (UNDERPADS AND DIAPERS) ×10 IMPLANT
VENT LEFT HEART 12002 (CATHETERS) ×5
WATER STERILE IRR 1000ML POUR (IV SOLUTION) ×20 IMPLANT
WIRE .035 3MM-J 145CM (WIRE) ×5 IMPLANT
WIRE J 3MM .035X145CM (WIRE) ×5 IMPLANT

## 2016-05-28 NOTE — Op Note (Signed)
CARDIOTHORACIC SURGERY OPERATIVE NOTE  Date of Procedure:  05/28/2016  Preoperative Diagnosis:   Atrial Septal Defect  Recurrent Persistent Atrial Fibrillation  Postoperative Diagnosis: Same  Procedure:   Closure of Atrial Septal Defect  Primary suture closure   Maze Procedure   complete bilateral atrial lesion set using bipolar radiofrequency and cryothermy ablation  clipping of left atrial appendage (Atriclip size 24mm)   Surgeon: Valentina Gu. Roxy Manns, MD  Assistant: Nani Skillern, PA-C  Anesthesia: Catalina Gravel, MD  Operative Findings:  Mild global LV systolic dysfunction  Secundum type atrial septal defect             BRIEF CLINICAL NOTE AND INDICATIONS FOR SURGERY  Patient is a 55 year old moderately obese female with long-standing history of atrial fibrillation that has failed medical therapy on multiple agents and catheter-based ablation on 2 previous occasions who has been referred for surgical consultation to consider possible maze procedure. The patient states that she first developed paroxysmal atrial fibrillation in 2008. She has been followed for several years by Dr. Rayann Heman. She has failed Tikosyn therapy and catheter-based ablation 2 in 2013. She is not felt to be a candidate for long term amiodarone therapy. Over the last several years she has experienced continued progression of symptoms of recurrent palpitations, exertional shortness of breath, and fatigue. She has an implantable loop recorder which has documented increasing burden of atrial fibrillation and decreasing amounts of time when she remains in sinus rhythm, most recently with AF burden estimated 78%. 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 She has severe left and right atrial enlargement but no significant valvular heart disease. Transesophageal echocardiogram does  reveal a small atrial septal defect which may or may not be related to previous transseptal puncture for catheter-based ablation.  The patient has been seen in consultation and counseled at length regarding the indications, risks and potential benefits of surgery.  All questions have been answered, and the patient provides full informed consent for the operation as described.    DETAILS OF THE OPERATIVE PROCEDURE  Preparation:  The patient is brought to the operating room on the above mentioned date and central monitoring was established by the anesthesia team including placement of Swan-Ganz catheter and radial arterial line. The patient is placed in the supine position on the operating table.  Intravenous antibiotics are administered. General endotracheal anesthesia is induced uneventfully. A Foley catheter is placed.  Baseline transesophageal echocardiogram was performed.  Findings were notable for mild LV systolic dysfunction with ejection fraction estimated 45-50%.  There was a secundum type atrial septal defect with left to right flow across the atrial septum.  The patient's chest, abdomen, both groins, and both lower extremities are prepared and draped in a sterile manner. A time out procedure is performed.   Surgical Approach:  A median sternotomy incision was performed and the pericardium is opened. The ascending aorta is normal in appearance.    Extracorporeal Cardiopulmonary Bypass and Myocardial Protection:  The right common femoral vein is cannulated using the Seldinger technique and a guidewire advanced into the right atrium using TEE guidance.  The patient is heparinized systemically and the femoral vein cannulated using a 22 Fr long femoral venous cannula.  The ascending aorta is cannulated for cardiopulmonary bypass.  Adequate heparinization is verified.   A retrograde cardioplegia cannula is placed through the right atrium into the coronary sinus.   The entire pre-bypass  portion of the operation was  notable for stable hemodynamics.  Cardiopulmonary bypass was begun and the surface of the heart is inspected.  A second venous cannula is placed through the right atrium into the superior vena cava.   A cardioplegia cannula is placed in the ascending aorta.  A temperature probe was placed in the interventricular septum.  Umbilical tapes are placed around the superior and inferior vena cavae.  The patient is cooled to 32C systemic temperature.  The aortic cross clamp is applied and cold blood cardioplegia is delivered initially in an antegrade fashion through the aortic root.   Supplemental cardioplegia is given retrograde through the coronary sinus catheter.  Iced saline slush is applied for topical hypothermia.  The initial cardioplegic arrest is rapid with early diastolic arrest.  Repeat doses of cardioplegia are administered intermittently throughout the entire cross clamp portion of the operation through the aortic root and through the coronary sinus catheter in order to maintain completely flat electrocardiogram and septal myocardial temperature below 15C.  Myocardial protection was felt to be excellent.   Maze Procedure (left atrial lesion set):  The AtriCure Synergy bipolar radiofrequency ablation clamp is used for all radiofrequency ablation lesions for the maze procedure.  The Atricure CryoICE nitrous oxide cryothermy system is utilized for all cryothermy ablation lesions.   The heart is retracted towards the surgeon's side and the left sided pulmonary veins exposed.  An elliptical ablation lesion is created around the base of the left sided pulmonary veins.  A similar elliptical lesion was created around the base of the left atrial appendage.  The left atrial appendage was obliterated using an Atricure left atrial appendage clip (Atriclip, size 44mm).  The heart was replaced into the pericardial sac.  A left atriotomy incision was performed through the  interatrial groove and extended partially across the back wall of the left atrium after opening the oblique sinus inferiorly.  The floor of the left atrium and the mitral valve were exposed using a self-retaining retractor.  The secundum atrial septal defect is noted and felt to be too large to close from within the left atrium.  The umbilical tapes were snared around the superior and inferior vena cavae.  An ablation lesion was placed around the right sided pulmonary veins using the bipolar clamp with one limb of the clamp along the endocardial surface and one along the epicardial surface posteriorly.  A bipolar ablation lesion was placed across the dome of the left atrium from the cephalad apex of the atriotomy incision to reach the cephalad apex of the elliptical lesion around the left sided pulmonary veins.  A similar bipolar lesion was placed across the back wall of the left atrium from the caudad apex of the atriotomy incision to reach the caudad apex of the elliptical lesion around the left sided pulmonary veins, thereby completing a box.  Finally another bipolar lesion was placed across the back wall of the left atrium from the caudad apex of the atriotomy incision towards the posterior mitral valve annulus.  This lesion was completed along the endocardial surface onto the posterior mitral annulus with a 3 minute duration cryothermy lesion, followed by a second cryothermy lesion along the posterior epicardial surface of the left atrium to the coronary sinus.  The box lesion was duplicated using cryothermy.  This completes the entire left side lesion set of the Cox maze procedure.  The atriotomy was closed using a 2-layer closure of running 3-0 Prolene suture after placing a sump drain across the mitral valve to  serve as a left ventricular vent.     Closure of Atrial Septal Defect:  An oblique right atriotomy incision is performed. The intra-atrial septum is examined. There is a secundum type atrial  septal defect measuring 12-13 mm in diameter. This defect has a classical appearance of the secundum type defect and does not appear to be secondary to previous atrial septostomy. The defect is closed primarily using a 2 layer closure of running 4-0 Prolene suture.  Rewarming is begun.  One final dose of warm retrograde "hot shot" cardioplegia was administered retrograde through the coronary sinus catheter while all air was evacuated through the aortic root.  The aortic cross clamp was removed after a total cross clamp time of 71 minutes.   Maze Procedure (right atrial lesion set):  The retrograde cardioplegia cannula was removed.  The AtriCure Synergy bipolar radiofrequency ablation clamp is utilized to create a series of linear lesions in the right atrium, each with one limb of the clamp along the endocardial surface and the other along the epicardial surface. The first lesion is placed from the posterior apex of the atriotomy incision and along the lateral wall of the right atrium to reach the lateral aspect of the superior vena cava. A second lesion is placed in the opposite direction from the posterior apex of the atriotomy incision along the lateral wall to reach the lateral aspect of the inferior vena cava. A third lesion is placed from the midportion of the atriotomy incision extending at a right angle to reach the tip of the right atrial appendage. A fourth lesion is placed from the anterior apex of the atriotomy incision in an anterior and inferior direction to reach the acute margin of the heart. The cryotherapy probe is utilized to create an isthmus lesion across the right atrial isthmus, beginning at the 5:00 position on the tricuspid annulus, extending around the right lateral rim of the coronary sinus, and continuing across the isthmus to reach the atriotomy incision anteriorly. Finally, the cryotherapy probe is utilized to complete the right atrial lesion set by placing the probe along the  endocardial surface of the right atrium from the anterior apex of the atriotomy incision to reach the tricuspid annulus at the 2:00 position. The right atriotomy incision is closed with a 2 layer closure of running 4-0 Prolene suture.   Procedure Completion:  Epicardial pacing wires are fixed to the right ventricular outflow tract and to the right atrial appendage. The patient is rewarmed to 37C temperature. The aortic and left ventricular vents are removed.  The patient is weaned and disconnected from cardiopulmonary bypass.  The patient's rhythm at separation from bypass was junctional.  The patient was weaned from cardioplegic bypass without any inotropic support. Total cardiopulmonary bypass time for the operation was 117 minutes.  Followup transesophageal echocardiogram performed after separation from bypass revealed no residual communication between the left and right atrium.  Left ventricular function was unchanged from preoperatively.  The aortic and superior vena cava cannula were removed uneventfully. Protamine was administered to reverse the anticoagulation. The femoral venous cannula was removed and manual pressure held on the groin for 30 minutes.  The mediastinum and pleural space were inspected for hemostasis and irrigated with saline solution. The mediastinum was drained using 2 chest tubes placed through separate stab incisions inferiorly.  The soft tissues anterior to the aorta were reapproximated loosely. The sternum is closed with double strength sternal wire. The soft tissues anterior to the sternum were closed in multiple  layers and the skin is closed with a running subcuticular skin closure.   The post-bypass portion of the operation was notable for stable rhythm and hemodynamics.   No blood products were administered during the operation.   Patient Disposition:  The patient tolerated the procedure well and is transported to the surgical intensive care in stable condition.  There are no intraoperative complications. All sponge instrument and needle counts are verified correct at completion of the operation.     Valentina Gu. Roxy Manns MD 05/28/2016 12:40 PM

## 2016-05-28 NOTE — Care Management Note (Signed)
Case Management Note  Patient Details  Name: FINDLEY MEGGITT MRN: [id number] Date of Birth: 25-Jul-1961  Subjective/Objective:      S/p MAZE - hx of Tparoxysmal atrial fibrillation since  2008. She has been followed for several years by Dr. Rayann Heman. She has failed Tikosyn therapy and catheter-based ablation 2 in 2013.               Action/Plan:  PTA from home with husband and children.  Will continue to monitor for discharge needs   Expected Discharge Date:                  Expected Discharge Plan:  Home/Self Care  In-House Referral:     Discharge planning Services  CM Consult  Post Acute Care Choice:    Choice offered to:     DME Arranged:    DME Agency:     HH Arranged:    HH Agency:     Status of Service:  In process, will continue to follow  If discussed at Long Length of Stay Meetings, dates discussed:    Additional Comments:  Maryclare Labrador, RN 05/28/2016, 2:12 PM

## 2016-05-28 NOTE — Anesthesia Procedure Notes (Signed)
Central Venous Catheter Insertion Performed by: anesthesiologist Patient location: Pre-op. Preanesthetic checklist: patient identified, IV checked, site marked, risks and benefits discussed, surgical consent, monitors and equipment checked, pre-op evaluation, timeout performed and anesthesia consent Position: Trendelenburg Lidocaine 1% used for infiltration Landmarks identified Catheter size: 9 Fr MAC introducer Procedure performed using ultrasound guided technique. Attempts: 1 Following insertion, line sutured and dressing applied. Post procedure assessment: blood return through all ports, free fluid flow and no air. Patient tolerated the procedure well with no immediate complications.

## 2016-05-28 NOTE — Addendum Note (Signed)
Addendum  created 05/28/16 1631 by Catalina Gravel, MD   Anesthesia Intra Blocks edited, Child order released for a procedure order, LDA created via procedure documentation, Sign clinical note

## 2016-05-28 NOTE — Progress Notes (Signed)
Patient ID: Destiny Dennis, female   DOB: 08/17/1961, 55 y.o.   MRN: [id number]   SICU Evening Rounds:   Hemodynamically stable  CI = 1.7  Extubated and alert  Urine output good  CT output low  CBC    Component Value Date/Time   WBC 24.4 (H) 05/28/2016 1315   RBC 4.05 05/28/2016 1315   HGB 11.6 (L) 05/28/2016 1318   HCT 34.0 (L) 05/28/2016 1318   PLT 268 05/28/2016 1315   MCV 86.2 05/28/2016 1315   MCH 28.4 05/28/2016 1315   MCHC 33.0 05/28/2016 1315   RDW 13.7 05/28/2016 1315   LYMPHSABS 1,917 12/24/2015 0922   MONOABS 497 12/24/2015 0922   EOSABS 142 12/24/2015 0922   BASOSABS 0 12/24/2015 0922     BMET    Component Value Date/Time   NA 139 05/28/2016 1318   K 3.9 05/28/2016 1318   CL 102 05/28/2016 1152   CO2 24 05/26/2016 1600   GLUCOSE 126 (H) 05/28/2016 1318   BUN 16 05/28/2016 1152   CREATININE 0.80 05/28/2016 1152   CREATININE 0.85 12/24/2015 0922   CALCIUM 9.6 05/26/2016 1600   GFRNONAA >60 05/26/2016 1600   GFRAA >60 05/26/2016 1600     A/P:  Stable postop course. Continue current plans

## 2016-05-28 NOTE — Progress Notes (Signed)
CRITICAL VALUE ALERT  Critical value received:  R pneumothorax 15% via post-op X-ray  Date of notification:  05/28/16  Time of notification:  [id number]  Critical value read back:Yes.    Nurse who received alert:  Cassell Smiles, RN  MD notified (1st page):  Dr. Roxy Manns  Time of first page:  1330  MD notified (2nd page):  Time of second page:  Responding MD:  Dr. Roxy Manns  Time MD responded:  [id number]

## 2016-05-28 NOTE — Op Note (Signed)
CARDIOTHORACIC SURGERY OPERATIVE NOTE  Date of Procedure:  05/28/2016  Preoperative Diagnosis: Right Pneumothorax  Postoperative Diagnosis: Same  Procedure:   Right chest tube placement  Surgeon:   Valentina Gu. Roxy Manns, MD  Anesthesia:   1% lidocaine local with intravenous sedation    DETAILS OF THE OPERATIVE PROCEDURE  Following full informed consent the patient was given midazolam 2 mg intravenously and continuously monitored for rhythm, BP and oxygen saturation. The right anterior chest was prepared and draped in a sterile manner. 1% lidocaine was utilized to anesthetize the skin and subcutaneous tissues. A small incision was made and a 20 French straight chest tube was placed through the incision into the pleural space. The tube was secured to the skin and connected to a closed suction collection device. The patient tolerated the procedure well. A portable CXR was ordered. There were no complications.    Valentina Gu. Roxy Manns, MD 05/28/2016 1:56 PM

## 2016-05-28 NOTE — Interval H&P Note (Signed)
History and Physical Interval Note:  05/28/2016 6:33 AM  Destiny Dennis  has presented today for surgery, with the diagnosis of AFIB  The various methods of treatment have been discussed with the patient and family. After consideration of risks, benefits and other options for treatment, the patient has consented to  Procedure(s): MINIMALLY INVASIVE MAZE PROCEDURE (N/A) TRANSESOPHAGEAL ECHOCARDIOGRAM (TEE) (N/A) ATRIAL SEPTAL DEFECT (ASD) closure (N/A) as a surgical intervention .  The patient's history has been reviewed, patient examined, no change in status, stable for surgery.  I have reviewed the patient's chart and labs.  Questions were answered to the patient's satisfaction.     Rexene Alberts

## 2016-05-28 NOTE — Anesthesia Postprocedure Evaluation (Signed)
Anesthesia Post Note  Patient: Destiny Dennis  Procedure(s) Performed: Procedure(s) (LRB): TRANSESOPHAGEAL ECHOCARDIOGRAM (TEE) (N/A) ATRIAL SEPTAL DEFECT (ASD) closure (N/A) MEDIAN STERNOTOMY (N/A) MAZE CLIPPING OF ATRIAL APPENDAGE  Patient location during evaluation: SICU Anesthesia Type: General Level of consciousness: sedated and patient remains intubated per anesthesia plan Pain management: pain level controlled Vital Signs Assessment: post-procedure vital signs reviewed and stable Respiratory status: patient remains intubated per anesthesia plan Cardiovascular status: stable Anesthetic complications: no    Last Vitals:  Vitals:   05/28/16 1515 05/28/16 1530  BP:    Pulse: 79 80  Resp: 16 16  Temp: (!) 38 C (!) 38.1 C    Last Pain:  Vitals:   05/28/16 1330  TempSrc:   PainSc: 0-No pain                 Catalina Gravel

## 2016-05-28 NOTE — Anesthesia Procedure Notes (Signed)
Procedure Name: Intubation Date/Time: 05/28/2016 8:02 AM Performed by: Ollen Bowl Pre-anesthesia Checklist: Patient identified, Emergency Drugs available, Suction available, Patient being monitored and Timeout performed Patient Re-evaluated:Patient Re-evaluated prior to inductionOxygen Delivery Method: Circle system utilized and Simple face mask Preoxygenation: Pre-oxygenation with 100% oxygen Intubation Type: IV induction and Cricoid Pressure applied Ventilation: Mask ventilation without difficulty and Oral airway inserted - appropriate to patient size Laryngoscope Size: Sabra Heck and 3 Grade View: Grade II Endobronchial tube: Left, Double lumen EBT, EBT position confirmed by auscultation and EBT position confirmed by fiberoptic bronchoscope and 39 Fr Number of attempts: 1 Airway Equipment and Method: Patient positioned with wedge pillow and Stylet Placement Confirmation: ETT inserted through vocal cords under direct vision,  positive ETCO2 and breath sounds checked- equal and bilateral Secured at: 29 cm Tube secured with: Tape Dental Injury: Teeth and Oropharynx as per pre-operative assessment

## 2016-05-28 NOTE — Procedures (Signed)
Extubation Procedure Note  Patient Details:   Name: Destiny Dennis DOB: 1961/01/01 MRN: [id number]   Airway Documentation:     Evaluation  O2 sats: stable throughout Complications: No apparent complications Patient did tolerate procedure well. Bilateral Breath Sounds: Clear, Diminished   Yes   Positive cuff leak noted, NIF - 20, VC 750.  Post extubation pt placed on nasal cannula 4 Lpm with humidity, no stridor noted, pt able to get 1000 using incentive spirometer.  Bayard Beaver 05/28/2016, 6:51 PM

## 2016-05-28 NOTE — OR Nursing (Signed)
2nd call to SICU @1227 

## 2016-05-28 NOTE — Anesthesia Procedure Notes (Signed)
Procedure Name: Intubation Date/Time: 05/28/2016 12:37 PM Performed by: Ollen Bowl Pre-anesthesia Checklist: Patient identified, Emergency Drugs available, Suction available, Patient being monitored and Timeout performed Patient Re-evaluated:Patient Re-evaluated prior to inductionOxygen Delivery Method: Circle system utilized Preoxygenation: Pre-oxygenation with 100% oxygen Laryngoscope Size: Mac and 4 Grade View: Grade II Tube type: Oral Tube size: 8.0 mm Number of attempts: 1 Airway Equipment and Method: Patient positioned with wedge pillow and Stylet Placement Confirmation: ETT inserted through vocal cords under direct vision,  positive ETCO2 and breath sounds checked- equal and bilateral Secured at: 23 cm Tube secured with: Tape Dental Injury: Teeth and Oropharynx as per pre-operative assessment

## 2016-05-28 NOTE — Progress Notes (Signed)
  Echocardiogram Echocardiogram Transesophageal has been performed.  Darlina Sicilian M 05/28/2016, 8:52 AM

## 2016-05-28 NOTE — Anesthesia Procedure Notes (Signed)
Central Venous Catheter Insertion Performed by: anesthesiologist Patient location: Pre-op. Preanesthetic checklist: patient identified, IV checked, site marked, risks and benefits discussed, surgical consent, monitors and equipment checked, pre-op evaluation, timeout performed and anesthesia consent Landmarks identified PA cath was placed.Swan type and PA catheter depth:thermodilationProcedure performed using ultrasound guided technique. Attempts: 1 Patient tolerated the procedure well with no immediate complications.       

## 2016-05-28 NOTE — Progress Notes (Signed)
Per Dr. Guy Sandifer request.

## 2016-05-28 NOTE — Transfer of Care (Signed)
Immediate Anesthesia Transfer of Care Note  Patient: Destiny Dennis  Procedure(s) Performed: Procedure(s): TRANSESOPHAGEAL ECHOCARDIOGRAM (TEE) (N/A) ATRIAL SEPTAL DEFECT (ASD) closure (N/A) MEDIAN STERNOTOMY (N/A) MAZE CLIPPING OF ATRIAL APPENDAGE  Patient Location: SICU  Anesthesia Type:General  Level of Consciousness: Patient remains intubated per anesthesia plan  Airway & Oxygen Therapy: Patient remains intubated per anesthesia plan and Patient placed on Ventilator (see vital sign flow sheet for setting)  Post-op Assessment: Report given to RN and Post -op Vital signs reviewed and stable  Post vital signs: Reviewed and stable  Last Vitals:  Vitals:   05/28/16 0629  BP: 130/80  Pulse: 91  Resp: 20  Temp: 36.6 C    Last Pain:  Vitals:   05/28/16 0629  TempSrc: Oral      Patients Stated Pain Goal: 8 ([id number])  Complications: No apparent anesthesia complications

## 2016-05-28 NOTE — Brief Op Note (Addendum)
05/28/2016  11:26 AM  PATIENT:  Destiny Dennis  55 y.o. female  PRE-OPERATIVE DIAGNOSIS: 1.  A FIB 2. ASD  POST-OPERATIVE DIAGNOSIS:  1. AFIB 2. ASD (Secundum)  PROCEDURE: TRANSESOPHAGEAL ECHOCARDIOGRAM (TEE), MEDIAN STERNOTOMY, CLOSURE of ATRIAL SEPTAL DEFECT (ASD), BILATERAL COX MAZE PROCEDURE WITH CLIPPING OF ATRIAL APPENDAGE  SURGEON:    Rexene Alberts, MD  ASSISTANTS:  Lars Pinks, PA-C  ANESTHESIA:   Catalina Gravel, MD  CROSSCLAMP TIME:   50'  CARDIOPULMONARY BYPASS TIME: 117'  FINDINGS:  Mild global LV systolic dysfunction  Secundum type atrial septal defect  Maze Procedure  Surgical Approach: Median sternotomy  Cut-and-sew:  No.  Cryo: Yes  Cryo Lesions (select all that apply):      2   Box Lesion,  6  Mitral Valve Cryo Lesion,    9. Intercaval line to tircuspid annulus ("T" lesion)  10  Tricuspid Cryo Lesion, 16  Other - epicardial posterior AV groove and coronary sinus and RA isthmus flutter lesion  Radiofrequency:  Yes.  Bipolar: Yes.  RF Lesions (select all that apply):      1   Pulmonary Vein Isolation,    2   Box Lesion,   3a  Inferior Pulmonary Vein Connecting Lesion,   3b  Superior Pulmonary Vein Connecting Lesion,     4  Posterior Mirtal Annular Line,    11  Intercaval Line,   15a  RAA Lateral Wall (Short) and   15b  RAA Lateral Wall to "T" Lesion    Left Atrial Appendage Treatment:    Yes -  epicardial clip    COMPLICATIONS: None  BASELINE WEIGHT: 125 kg  PATIENT DISPOSITION:   TO SICU IN STABLE CONDITION  Rexene Alberts, MD 05/28/2016 12:33 PM

## 2016-05-29 ENCOUNTER — Inpatient Hospital Stay (HOSPITAL_COMMUNITY): Payer: Managed Care, Other (non HMO)

## 2016-05-29 ENCOUNTER — Encounter (HOSPITAL_COMMUNITY): Payer: Self-pay | Admitting: Thoracic Surgery (Cardiothoracic Vascular Surgery)

## 2016-05-29 LAB — BASIC METABOLIC PANEL
Anion gap: 4 — ABNORMAL LOW (ref 5–15)
BUN: 10 mg/dL (ref 6–20)
CALCIUM: 7.8 mg/dL — AB (ref 8.9–10.3)
CO2: 23 mmol/L (ref 22–32)
Chloride: 107 mmol/L (ref 101–111)
Creatinine, Ser: 0.65 mg/dL (ref 0.44–1.00)
GFR calc Af Amer: >60 mL/min (ref >60)
GLUCOSE: 125 mg/dL — AB (ref 65–99)
Potassium: 4.5 mmol/L (ref 3.5–5.1)
Sodium: 134 mmol/L — ABNORMAL LOW (ref 135–145)

## 2016-05-29 LAB — CBC
HCT: 32.5 % — ABNORMAL LOW (ref 36.0–46.0)
HCT: 30.3 % — ABNORMAL LOW (ref 36.0–46.0)
Hemoglobin: 10.4 g/dL — ABNORMAL LOW (ref 12.0–15.0)
Hemoglobin: 9.7 g/dL — ABNORMAL LOW (ref 12.0–15.0)
MCH: 28.1 pg (ref 26.0–34.0)
MCH: 28 pg (ref 26.0–34.0)
MCHC: 32 g/dL (ref 30.0–36.0)
MCHC: 32 g/dL (ref 30.0–36.0)
MCV: 87.8 fL (ref 78.0–100.0)
MCV: 87.3 fL (ref 78.0–100.0)
PLATELETS: 264 10*3/uL (ref 150–400)
PLATELETS: 169 10*3/uL (ref 150–400)
RBC: 3.7 MIL/uL — ABNORMAL LOW (ref 3.87–5.11)
RBC: 3.47 MIL/uL — AB (ref 3.87–5.11)
RDW: 14.4 % (ref 11.5–15.5)
RDW: 14.4 % (ref 11.5–15.5)
WBC: 19.7 10*3/uL — ABNORMAL HIGH (ref 4.0–10.5)
WBC: 14.4 10*3/uL — ABNORMAL HIGH (ref 4.0–10.5)

## 2016-05-29 LAB — GLUCOSE, CAPILLARY
GLUCOSE-CAPILLARY: 122 mg/dL — AB (ref 65–99)
GLUCOSE-CAPILLARY: 123 mg/dL — AB (ref 65–99)
GLUCOSE-CAPILLARY: 119 mg/dL — AB (ref 65–99)
GLUCOSE-CAPILLARY: 111 mg/dL — AB (ref 65–99)
GLUCOSE-CAPILLARY: 122 mg/dL — AB (ref 65–99)
GLUCOSE-CAPILLARY: 113 mg/dL — AB (ref 65–99)
GLUCOSE-CAPILLARY: 110 mg/dL — AB (ref 65–99)
GLUCOSE-CAPILLARY: 106 mg/dL — AB (ref 65–99)
GLUCOSE-CAPILLARY: 123 mg/dL — AB (ref 65–99)
Glucose-Capillary: 109 mg/dL — ABNORMAL HIGH (ref 65–99)
Glucose-Capillary: 121 mg/dL — ABNORMAL HIGH (ref 65–99)
Glucose-Capillary: 123 mg/dL — ABNORMAL HIGH (ref 65–99)
Glucose-Capillary: 126 mg/dL — ABNORMAL HIGH (ref 65–99)
Glucose-Capillary: 152 mg/dL — ABNORMAL HIGH (ref 65–99)
Glucose-Capillary: 82 mg/dL (ref 65–99)

## 2016-05-29 LAB — POCT I-STAT, CHEM 8
BUN: 12 mg/dL (ref 6–20)
CALCIUM ION: 1.15 mmol/L (ref 1.15–1.40)
Chloride: 98 mmol/L — ABNORMAL LOW (ref 101–111)
Creatinine, Ser: 0.7 mg/dL (ref 0.44–1.00)
GLUCOSE: 116 mg/dL — AB (ref 65–99)
HCT: 27 % — ABNORMAL LOW (ref 36.0–46.0)
HEMOGLOBIN: 9.2 g/dL — AB (ref 12.0–15.0)
POTASSIUM: 4.1 mmol/L (ref 3.5–5.1)
SODIUM: 135 mmol/L (ref 135–145)
TCO2: 26 mmol/L (ref 0–100)

## 2016-05-29 LAB — POCT I-STAT 3, ART BLOOD GAS (G3+)
Acid-base deficit: 4 mmol/L — ABNORMAL HIGH (ref 0.0–2.0)
BICARBONATE: 21.9 mmol/L (ref 20.0–28.0)
O2 SAT: 92 %
PCO2 ART: 42.1 mmHg (ref 32.0–48.0)
PO2 ART: 65 mmHg — AB (ref 83.0–108.0)
Patient temperature: 97.1
TCO2: 23 mmol/L (ref 0–100)
pH, Arterial: 7.319 — ABNORMAL LOW (ref 7.350–7.450)

## 2016-05-29 LAB — MAGNESIUM
Magnesium: 2.5 mg/dL — ABNORMAL HIGH (ref 1.7–2.4)
Magnesium: 2.1 mg/dL (ref 1.7–2.4)

## 2016-05-29 LAB — CREATININE, SERUM
Creatinine, Ser: 0.81 mg/dL (ref 0.44–1.00)
GFR calc Af Amer: >60 mL/min (ref >60)
GFR calc non Af Amer: >60 mL/min (ref >60)

## 2016-05-29 MED ORDER — ORAL CARE MOUTH RINSE: 15.0000 mL | Freq: Two times a day (BID) | OROMUCOSAL | Status: DC

## 2016-05-29 MED ORDER — INSULIN ASPART 100 UNIT/ML ~~LOC~~ SOLN
0.0000 [IU] | SUBCUTANEOUS | Status: DC
  Administered 2016-05-29 – 2016-06-01 (×6): 2 [IU] via SUBCUTANEOUS

## 2016-05-29 MED ORDER — INFLUENZA VAC SPLIT QUAD 0.5 ML IM SUSY
0.5000 mL | PREFILLED_SYRINGE | INTRAMUSCULAR | Status: DC | PRN
Start: 2016-05-29 — End: 2016-06-09

## 2016-05-29 MED ORDER — KETOROLAC TROMETHAMINE 15 MG/ML IJ SOLN
15.0000 mg | Freq: Four times a day (QID) | INTRAMUSCULAR | Status: AC
  Administered 2016-05-29 – 2016-05-30 (×5): 15 mg via INTRAVENOUS
  Filled 2016-05-29 (×5): qty 1

## 2016-05-29 MED ORDER — FUROSEMIDE 10 MG/ML IJ SOLN
20.0000 mg | Freq: Four times a day (QID) | INTRAMUSCULAR | Status: AC
Start: 2016-05-29 — End: 2016-05-29
  Administered 2016-05-29 (×3): 20 mg via INTRAVENOUS
  Filled 2016-05-29 (×2): qty 2

## 2016-05-29 MED ORDER — INSULIN DETEMIR 100 UNIT/ML ~~LOC~~ SOLN
20.0000 [IU] | Freq: Once | SUBCUTANEOUS | Status: AC
  Administered 2016-05-29: 20 [IU] via SUBCUTANEOUS
  Filled 2016-05-29: qty 0.2

## 2016-05-29 MED ORDER — SODIUM BICARBONATE 8.4 % IV SOLN
50.0000 meq | Freq: Once | INTRAVENOUS | Status: AC
  Administered 2016-05-29: 50 meq via INTRAVENOUS

## 2016-05-29 MED ORDER — ENOXAPARIN SODIUM 30 MG/0.3ML ~~LOC~~ SOLN
30.0000 mg | Freq: Every day | SUBCUTANEOUS | Status: DC
  Administered 2016-05-30: 30 mg via SUBCUTANEOUS
  Filled 2016-05-29: qty 0.3

## 2016-05-29 MED FILL — Sodium Bicarbonate IV Soln 8.4%: INTRAVENOUS | Qty: 50 | Status: AC

## 2016-05-29 MED FILL — Lidocaine HCl IV Inj 20 MG/ML: INTRAVENOUS | Qty: 5 | Status: AC

## 2016-05-29 MED FILL — Heparin Sodium (Porcine) Inj 1000 Unit/ML: INTRAMUSCULAR | Qty: 10 | Status: AC

## 2016-05-29 MED FILL — Electrolyte-R (PH 7.4) Solution: INTRAVENOUS | Qty: 4000 | Status: AC

## 2016-05-29 MED FILL — Sodium Chloride IV Soln 0.9%: INTRAVENOUS | Qty: 2000 | Status: AC

## 2016-05-29 MED FILL — Mannitol IV Soln 20%: INTRAVENOUS | Qty: 500 | Status: AC

## 2016-05-29 NOTE — Discharge Summary (Signed)
Physician Discharge Summary       [address]       [place] [address]             [phone]    Patient ID: Destiny Dennis MRN: [id number] DOB/AGE: [birth year] 53 y.o.  Admit date: 05/28/2016 Discharge date: 06/09/2016  Admission Diagnoses: 1. Recurrent persistent atrial fibrillation ([place]) 2. Atrial septal defect  Active Diagnoses:  1. SVT (supraventricular tachycardia) (HCC) 2. Obesity 3. Anxiety 4. Cardiomyopathy ([place]) 5. Right pneumthorax 6. Arthritis-OA, s/p numerous surgeries -back, knees, hip 7. Cervical disc disease 8. DVT (deep venous thrombosis) (HCC)i n pregnancy, s/p hip surgery 9. GERD-diet related 10. Headache 11. Hiatal hernia-small 12. Hypertension 13. Peripheral vascular disease ([place]) 14. PONV (postoperative nausea and vomiting) 15. Sialoadenitis 16. Allergic rhinitis 17. 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  and LINQ removal  Procedure (s):   Closure of Atrial Septal Defect             Primary suture closure   Maze Procedure              complete bilateral atrial lesion set using bipolar radiofrequency and cryothermy ablation clipping of left atrial appendage (Atriclip size 29mm) by Dr. Roxy Manns on 05/28/2016.  Right chest tube placement by Dr. Roxy Manns on 05/28/2016  History of Presenting Illness: This is Destiny 55 year old moderately obese female with long-standing history of atrial fibrillation that has failed medical therapy on multiple agents and catheter-based ablation on 2 previous occasions who has been referred for surgical consultation to consider possible maze procedure. The patient states that she first developed paroxysmal atrial fibrillation in 2008. She has been followed for several years by Dr. Rayann Heman. She has failed Tikosyn therapy and catheter-based ablation 2 in 2013. She is not felt to be Destiny candidate for long term amiodarone therapy. Over the last several years she has experienced continued  progression of symptoms of recurrent palpitations, exertional shortness of breath, and fatigue. She has an implantable loop recorder which has documented increasing burden of atrial fibrillation and decreasing amounts of time when she remains in sinus rhythm, most recently with AF burden estimated 78%. 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 She has severe left and right atrial enlargement but no significant valvular heart disease. Transesophageal echocardiogram does reveal Destiny small atrial septal defect which may or may not be related to previous transseptal puncture for catheter-based ablation.  The patient is married and lives locally in [place] with her husband and 2 children. She works full-time as Destiny Dennis for an [employer]. This does not require strenuous exertion but does require some traveling. She has been moderately obese for the past 20 years and unsuccessful with attempts at weight loss. Her physical activity has been limited by problems with severe degenerative arthritis afflicting both knees and her hips. However, in recent months her activity has been limited primarily by exertional shortness of breath. She denies any history of exertional chest pain or chest tightness. She now gets short of breath with moderate level activity. She denies any history of PND, orthopnea, or lower extremity edema. She has frequent palpitations. Her heart rate goes up very quickly with attempts to exercise. The patient has been chronically anticoagulated using Pradaxa for years. She has not had any complications related to long-term anticoagulation. The remainder of her review of systems is noncontributory.  Patient returns to the office today for follow-up of chronic persistent atrial  fibrillation. She was originally seen in consultation on 01/27/2016 at which time long-term  treatment strategies for management of atrial fibrillation were discussed. Since then she has been seen in follow-up by Dr. Rayann Heman on 03/18/2016. She remained in atrial fibrillation at that time, and she has since then decided that she is interested in proceeding with elective Maze procedure. She underwent left and right heart catheterization on 04/10/2016. She did not have any significant coronary artery disease. Left ventricular ejection fraction was estimated 45%. She did not have pulmonary hypertension. Oxygen saturation run to check for Destiny step up and oxygen saturation and quantify shunt fraction related to her small atrial septal defect was apparently not performed. The patient returns for office today with her husband. She reports no new problems or complaints. She complains of stable chronic fatigue and exertional shortness of breath. She continues to have chronic palpitations. She has not had chest pain, dizziness, or syncope. She has been referred to pulmonary medicine for Destiny sleep study to test for the possibility of obstructive sleep apnea, but this has not yet been performed.  Patient is Destiny 55 year old obese white female who returns to the office today for follow-up of chronic persistent atrial fibrillation with plans to proceed with elective minimally invasive Maze procedure and closure of atrial septal defect on 05/28/2016. She was last seen here in our office on 04/13/2016. Since then she has remained clinically stable. She reports no new problems or complaints. She continues to experience chronic symptoms of exertional shortness of breath and fatigue.  Brief Hospital Course:  The patient was extubated the evening of surgery without difficulty. She remained afebrile and hemodynamically stable. Gordy Councilman, Destiny line, and foley were removed early in the post operative course. Chest tubes remained for Destiny few days then were removed by 10/14. She was initially AAI paced and beta blocker was held. She  then developed Destiny junctional rhythm fololwed by Destiny flutter. She was put on an Amiodarone drip. She was no longer paced and she was started on Lopressor. She then went into Destiny fib with RVR and was put on Destiny Cardizem drip.  She was on Destiny Heparin drip for Destiny fib. She was volume over loaded and diuresed. She had ABL anemia. She did not require Destiny post op transfusion. Last H and H was 7.9 and 24.6. She was on Niferex and Foltx. She was weaned off the insulin drip. The patient's HGA1C pre op was 5.4 . The patient was felt surgically stable for transfer from the ICU to PCTU for further convalescence on 06/03/2016. She continues to progress with cardiac rehab. She was ambulating on room air. She has been tolerating Destiny diet and has had Destiny bowel movement. Epicardial pacing wires on 06/05/2016. Heparin drip was stopped and she was restarted on Xarelto. She then developed tachy brady syndrome and had Destiny very long pause. Xarelto was stopped. EP determined she needed Destiny MDT with atrial therapies and placement of LV lead with PPM. Chest x ray this am is stable. Her pacer was initially set at 60 but because she was in accelerated junctional rhythm this am, her rate  is now 70. Her device interrogation was normal. Chest tube sutures were removed on 06/09/2016.  Per EPS, will restart Xarelto on 10/25. The patient is felt surgically stable for discharge today.   Latest Vital Signs: Blood pressure (!) 132/53, pulse 62, temperature 98.8 F (37.1 C), temperature source Oral, resp. rate 20, height 5\' 11"  (1.803 m), weight 275 lb  3.2 oz (124.8 kg), last menstrual period 04/17/2012, SpO2 91 %.  Physical Exam: Rhythm:                       Junctional bradycardia - AAI pacing             Breath sounds:            clear             Heart sounds:              RRR             Incisions:                     Dressings dry, intact             Abdomen:                    Soft, non-distended, non-tender             Extremities:                  Warm, well-perfused             Chest tubes:                decreasing volume thin serosanguinous output, no air leak  Discharge Condition:Stable and discharged to home  Recent laboratory studies:  Lab Results  Component Value Date   WBC 14.9 (H) 06/07/2016   HGB 7.9 (L) 06/07/2016   HCT 24.6 (L) 06/07/2016   MCV 84.8 06/07/2016   PLT 328 06/07/2016   Lab Results  Component Value Date   NA 135 06/04/2016   K 4.2 06/04/2016   CL 98 (L) 06/04/2016   CO2 29 06/04/2016   CREATININE 0.83 06/04/2016   GLUCOSE 104 (H) 06/04/2016    Diagnostic Studies:  EXAM: CHEST  2 VIEW  COMPARISON:  06/02/2016  FINDINGS: Mild bibasilar atelectasis. No focal consolidation. Trace left pleural effusion. No pneumothorax.  Mild cardiomegaly. Left subclavian pacemaker in satisfactory position.  Median sternotomy.  Cervical spine fixation hardware.  IMPRESSION: Left subclavian pacemaker in satisfactory position.  Mild bibasilar atelectasis with trace left pleural effusion.  No pneumothorax.   Electronically Signed   By: Julian Hy M.D.   On: 06/09/2016 08:54   Discharge Medications:   Medication List    STOP taking these medications   multivitamin with minerals Tabs tablet   traMADol 50 MG tablet Commonly known as:  ULTRAM     TAKE these medications   acetaminophen 325 MG tablet Commonly known as:  TYLENOL Take 1-2 tablets (325-650 mg total) by mouth every 6 (six) hours as needed for mild pain or headache (For pain.). What changed:  medication strength  how much to take  reasons to take this   amiodarone 200 MG tablet Commonly known as:  PACERONE Take 1 tablet (200 mg total) by mouth 2 (two) times daily.   amoxicillin 500 MG capsule Commonly known as:  AMOXIL Take 2,000 mg by mouth daily as needed. Take 1 hour prior to dental appointment.   celecoxib 200 MG capsule Commonly known as:  CELEBREX Take 200 mg by mouth daily.   diazepam 5 MG  tablet Commonly known as:  VALIUM Take 5 mg by mouth at bedtime.   diclofenac sodium 1 % Gel Commonly known as:  VOLTAREN Apply 2 g topically 2 (two) times daily as needed for pain.  FISH OIL PO Take 1 capsule by mouth daily.   folic acid-pyridoxine-cyancobalamin 2.5-25-2 MG Tabs tablet Commonly known as:  FOLTX Take 1 tablet by mouth daily. For one month then stop.   furosemide 40 MG tablet Commonly known as:  LASIX Take 1 tablet (40 mg total) by mouth daily.   iron polysaccharides 150 MG capsule Commonly known as:  NIFEREX Take 1 capsule (150 mg total) by mouth daily. For one month then stop.   ketotifen 0.025 % ophthalmic solution Commonly known as:  ZADITOR Place 1 drop into both eyes 2 (two) times daily as needed (allergies/ itching).   metoprolol succinate 100 MG 24 hr tablet Commonly known as:  TOPROL-XL Take 1 tablet (100 mg total) by mouth daily. Take with or immediately following Destiny meal. What changed:  when to take this   oxyCODONE 5 MG immediate release tablet Commonly known as:  Oxy IR/ROXICODONE Take 5 mg every 4-6 hours PRN severe pain   pantoprazole 40 MG tablet Commonly known as:  PROTONIX TAKE 1 TABLET (40 MG TOTAL) BY MOUTH DAILY AS NEEDED (HEARTBURN).   PAZEO 0.7 % Soln Generic drug:  Olopatadine HCl Place 1 drop into both eyes daily as needed. For allergy eyes   potassium chloride SA 20 MEQ tablet Commonly known as:  K-DUR,KLOR-CON Take 1 tablet (20 mEq total) by mouth daily.   rivaroxaban 20 MG Tabs tablet Commonly known as:  XARELTO Take 1 tablet (20 mg total) by mouth at bedtime. Start taking on:  06/10/2016 What changed:  See the new instructions.      The patient has been discharged on:   1.Beta Blocker:  Yes [ x  ]                              No   [   ]                              If No, reason:  2.Ace Inhibitor/ARB: Yes [   ]                                     No  [  x  ]                                     If No, reason:  Labile BP, no CAD  3.Statin:   Yes [   ]                  No  [ x  ]                  If No, reason: No CAD  4.[place]:  Yes  [ x  ]                  No   [   ]                  If No, reason:  Follow Up Appointments: Follow-up Information    Rexene Alberts, MD Follow up on 06/22/2016.   Specialty:  Cardiothoracic Surgery Why:  PA/LAT CXR to be taken (at [facility] which is in the same building as Dr. Guy Sandifer  office) on 06/22/2016 at 1:00 pm;Appointment time is at 1:30 pm Contact information: [address] [place] [address] [phone]        Amber Seiler, NP Follow up on 06/24/2016.   Specialty:  Cardiology Why:  at [facility] for wound check and AF follow up Contact information: [place] [address] [phone]           Signed: Lars Pinks MPA-C 06/09/2016, 8:54 AM

## 2016-05-29 NOTE — Progress Notes (Signed)
      [address]       [place] [address]             [phone]        CARDIOTHORACIC SURGERY PROGRESS NOTE   R1 Day Post-Op Procedure(s) (LRB): TRANSESOPHAGEAL ECHOCARDIOGRAM (TEE) (N/A) ATRIAL SEPTAL DEFECT (ASD) closure (N/A) MEDIAN STERNOTOMY (N/A) MAZE CLIPPING OF ATRIAL APPENDAGE  Subjective: Looks good.  Mild soreness in chest.    Objective: Vital signs: BP Readings from Last 1 Encounters:  05/29/16 114/68   Pulse Readings from Last 1 Encounters:  05/29/16 80   Resp Readings from Last 1 Encounters:  05/29/16 18   Temp Readings from Last 1 Encounters:  05/29/16 98.8 F (37.1 C)    Hemodynamics: PAP: (30-51)/(14-37) 38/30 CO:  [3.2 L/min-5.2 L/min] 5.2 L/min CI:  [1.3 L/min/m2-2.2 L/min/m2] 2.2 L/min/m2  Physical Exam:  Rhythm:   Junctional bradycardia - AAI pacing  Breath sounds: clear  Heart sounds:  RRR  Incisions:  Dressings dry, intact  Abdomen:  Soft, non-distended, non-tender  Extremities:  Warm, well-perfused  Chest tubes:  decreasing volume thin serosanguinous output, no air leak    Intake/Output from previous day: 10/12 0701 - 10/13 0700 In: 6637.3 [I.V.:4517.3; Blood:650; IV [id number] Out: [id number] [Urine:1415; Blood:1300; Chest Tube:690] Intake/Output this shift: No intake/output data recorded.  Lab Results:  CBC: Recent Labs  05/28/16 1900 05/28/16 1946 05/29/16 0400  WBC 19.3*  --  19.7*  HGB 10.8* 11.2* 10.4*  HCT 32.9* 33.0* 32.5*  PLT 272  --  264    BMET:  Recent Labs  05/26/16 1600  05/28/16 1946 05/29/16 0400  NA 138  < > 139 134*  K 3.7  < > 4.8 4.5  CL 104  < > 105 107  CO2 24  --   --  23  GLUCOSE 96  < > 129* 125*  BUN 16  < > 15 10  CREATININE 0.88  < > 0.80 0.65  CALCIUM 9.6  --   --  7.8*  < > = values in this interval not displayed.   PT/INR:   Recent Labs  05/28/16 1315  LABPROT 16.1*  INR 1.28    CBG (last 3)   Recent Labs  05/29/16 0355 05/29/16 0510  05/29/16 0611  GLUCAP 121* 123* 111*    ABG    Component Value Date/Time   PHART 7.319 (L) 05/29/2016 0403   PCO2ART 42.1 05/29/2016 0403   PO2ART 65.0 (L) 05/29/2016 0403   HCO3 21.9 05/29/2016 0403   TCO2 23 05/29/2016 0403   ACIDBASEDEF 4.0 (H) 05/29/2016 0403   O2SAT 92.0 05/29/2016 0403    CXR: Not done  Assessment/Plan: S/P Procedure(s) (LRB): TRANSESOPHAGEAL ECHOCARDIOGRAM (TEE) (N/A) ATRIAL SEPTAL DEFECT (ASD) closure (N/A) MEDIAN STERNOTOMY (N/A) MAZE CLIPPING OF ATRIAL APPENDAGE  Overall looks good POD1 Maintaining AAI paced rhythm w/ stable hemodynamics, no drips  Junctional bradycardia under pacer Expected post op acute blood loss anemia, mild, Hgb stable 10.4 Chronic combined systolic and diastolic CHF with expected post-op volume excess Morbid obesity   Continue AAI pacing and hold beta blockers  Mobilize  Diuresis  Leave chest tubes until output decreases further  Start low dose lovenox tomorrow  Restart Xarelto prior to hospital d/c   Rexene Alberts, MD 05/29/2016 8:15 AM

## 2016-05-30 ENCOUNTER — Inpatient Hospital Stay (HOSPITAL_COMMUNITY): Payer: Managed Care, Other (non HMO)

## 2016-05-30 LAB — GLUCOSE, CAPILLARY
GLUCOSE-CAPILLARY: 112 mg/dL — AB (ref 65–99)
GLUCOSE-CAPILLARY: 117 mg/dL — AB (ref 65–99)
GLUCOSE-CAPILLARY: 117 mg/dL — AB (ref 65–99)
GLUCOSE-CAPILLARY: 128 mg/dL — AB (ref 65–99)
Glucose-Capillary: 115 mg/dL — ABNORMAL HIGH (ref 65–99)
Glucose-Capillary: 134 mg/dL — ABNORMAL HIGH (ref 65–99)
Glucose-Capillary: 96 mg/dL (ref 65–99)

## 2016-05-30 LAB — TYPE AND SCREEN
ABO/RH(D): O POS
ANTIBODY SCREEN: NEGATIVE
UNIT DIVISION: 0
Unit division: 0

## 2016-05-30 LAB — CBC
HCT: 28 % — ABNORMAL LOW (ref 36.0–46.0)
Hemoglobin: 9.1 g/dL — ABNORMAL LOW (ref 12.0–15.0)
MCH: 28.2 pg (ref 26.0–34.0)
MCHC: 32.5 g/dL (ref 30.0–36.0)
MCV: 86.7 fL (ref 78.0–100.0)
PLATELETS: 161 10*3/uL (ref 150–400)
RBC: 3.23 MIL/uL — ABNORMAL LOW (ref 3.87–5.11)
RDW: 14.3 % (ref 11.5–15.5)
WBC: 14.2 10*3/uL — ABNORMAL HIGH (ref 4.0–10.5)

## 2016-05-30 LAB — BASIC METABOLIC PANEL
Anion gap: 8 (ref 5–15)
BUN: 13 mg/dL (ref 6–20)
CHLORIDE: 99 mmol/L — AB (ref 101–111)
CO2: 26 mmol/L (ref 22–32)
CREATININE: 0.81 mg/dL (ref 0.44–1.00)
Calcium: 8 mg/dL — ABNORMAL LOW (ref 8.9–10.3)
GFR calc Af Amer: >60 mL/min (ref >60)
GFR calc non Af Amer: >60 mL/min (ref >60)
GLUCOSE: 125 mg/dL — AB (ref 65–99)
Potassium: 4.2 mmol/L (ref 3.5–5.1)
Sodium: 133 mmol/L — ABNORMAL LOW (ref 135–145)

## 2016-05-30 MED ORDER — AMIODARONE HCL IN DEXTROSE 360-4.14 MG/200ML-% IV SOLN
60.0000 mg/h | INTRAVENOUS | Status: AC
  Administered 2016-05-30 (×2): 60 mg/h via INTRAVENOUS
  Filled 2016-05-30: qty 200

## 2016-05-30 MED ORDER — AMIODARONE HCL IN DEXTROSE 360-4.14 MG/200ML-% IV SOLN
30.0000 mg/h | INTRAVENOUS | Status: DC
  Administered 2016-05-31 – 2016-06-03 (×10): 30 mg/h via INTRAVENOUS
  Filled 2016-05-30 (×8): qty 200

## 2016-05-30 MED ORDER — DIAZEPAM 5 MG PO TABS
5.0000 mg | ORAL_TABLET | Freq: Every evening | ORAL | Status: DC | PRN
  Administered 2016-05-30 – 2016-06-01 (×2): 5 mg via ORAL
  Filled 2016-05-30 (×2): qty 1

## 2016-05-30 MED ORDER — AMIODARONE HCL 200 MG PO TABS
200.0000 mg | ORAL_TABLET | Freq: Two times a day (BID) | ORAL | Status: DC
  Administered 2016-05-30 (×2): 200 mg via ORAL
  Filled 2016-05-30 (×2): qty 1

## 2016-05-30 MED ORDER — AMIODARONE HCL IN DEXTROSE 360-4.14 MG/200ML-% IV SOLN
INTRAVENOUS | Status: AC
  Administered 2016-05-30: 150 mg via INTRAVENOUS
  Filled 2016-05-30: qty 200

## 2016-05-30 MED ORDER — AMIODARONE LOAD VIA INFUSION
150.0000 mg | Freq: Once | INTRAVENOUS | Status: AC
Start: 2016-05-30 — End: 2016-05-30
  Administered 2016-05-30: 150 mg via INTRAVENOUS
  Filled 2016-05-30: qty 83.34

## 2016-05-30 MED ORDER — FUROSEMIDE 10 MG/ML IJ SOLN
20.0000 mg | Freq: Two times a day (BID) | INTRAMUSCULAR | Status: AC
  Administered 2016-05-30 – 2016-05-31 (×3): 20 mg via INTRAVENOUS
  Filled 2016-05-30 (×3): qty 2

## 2016-05-30 MED ORDER — INSULIN DETEMIR 100 UNIT/ML ~~LOC~~ SOLN
20.0000 [IU] | Freq: Every day | SUBCUTANEOUS | Status: DC
  Administered 2016-05-30 – 2016-06-01 (×3): 20 [IU] via SUBCUTANEOUS
  Filled 2016-05-30 (×4): qty 0.2

## 2016-05-30 NOTE — Progress Notes (Signed)
Social visit  Atrial flutter noted intermittently improved with amiodarone.  A paced currently.  Feel free to call me with any EP related concerns.  [name] MD, [facility] 05/30/2016 2:46 PM

## 2016-05-30 NOTE — Progress Notes (Signed)
2 Days Post-Op Procedure(s) (LRB): TRANSESOPHAGEAL ECHOCARDIOGRAM (TEE) (N/A) ATRIAL SEPTAL DEFECT (ASD) closure (N/A) MEDIAN STERNOTOMY (N/A) MAZE CLIPPING OF ATRIAL APPENDAGE Subjective:  HR 44 junctional A- paced 80 Walking in hall pain controlled  Objective: Vital signs in last 24 hours: Temp:  [97.4 F (36.3 C)-98.7 F (37.1 C)] 98.4 F (36.9 C) (10/14 0800) Pulse Rate:  [79-90] 90 (10/14 1000) Cardiac Rhythm: A-V Sequential paced (10/14 1000) Resp:  [17-33] 24 (10/14 1000) BP: (91-111)/(49-80) 100/55 (10/14 1000) SpO2:  [95 %-100 %] 99 % (10/14 1000) Arterial Line BP: (81-126)/(49-96) 81/67 (10/14 0700) Weight:  [288 lb 2.3 oz (130.7 kg)] 288 lb 2.3 oz (130.7 kg) (10/14 0600)  Hemodynamic parameters for last 24 hours:  stable  Intake/Output from previous day: 10/13 0701 - 10/14 0700 In: 612.4 [P.O.:240; I.V.:272.4; IV Piggyback:100] Out: 2180 [Urine:1835; Chest Tube:345] Intake/Output this shift: Total I/O In: 240 [P.O.:240] Out: 135 [Urine:135]  No murmur  Lab Results:  Recent Labs  05/29/16 1700 05/29/16 1727 05/30/16 0417  WBC 14.4*  --  14.2*  HGB 9.7* 9.2* 9.1*  HCT 30.3* 27.0* 28.0*  PLT 169  --  161   BMET:  Recent Labs  05/29/16 0400  05/29/16 1727 05/30/16 0417  NA 134*  --  135 133*  K 4.5  --  4.1 4.2  CL 107  --  98* 99*  CO2 23  --   --  26  GLUCOSE 125*  --  116* 125*  BUN 10  --  12 13  CREATININE 0.65  < > 0.70 0.81  CALCIUM 7.8*  --   --  8.0*  < > = values in this interval not displayed.  PT/INR:  Recent Labs  05/28/16 1315  LABPROT 16.1*  INR 1.28   ABG    Component Value Date/Time   PHART 7.319 (L) 05/29/2016 0403   HCO3 21.9 05/29/2016 0403   TCO2 26 05/29/2016 1727   ACIDBASEDEF 4.0 (H) 05/29/2016 0403   O2SAT 92.0 05/29/2016 0403   CBG (last 3)   Recent Labs  05/30/16 0003 05/30/16 0351 05/30/16 0843  GLUCAP 96 112* 117*    Assessment/Plan: S/P Procedure(s) (LRB): TRANSESOPHAGEAL ECHOCARDIOGRAM  (TEE) (N/A) ATRIAL SEPTAL DEFECT (ASD) closure (N/A) MEDIAN STERNOTOMY (N/A) MAZE CLIPPING OF ATRIAL APPENDAGE Keep in ICU for pacer dependance lovenox for now, Xarelto at DC   LOS: 2 days    Destiny Dennis 05/30/2016

## 2016-05-30 NOTE — Progress Notes (Signed)
  Amiodarone Drug - Drug Interaction Consult Note  Recommendations: No drug interactions identified at this time. Will continue to follow and review medication list.  Amiodarone is metabolized by the cytochrome P450 system and therefore has the potential to cause many drug interactions. Amiodarone has an average plasma half-life of 50 days (range 20 to 100 days).   There is potential for drug interactions to occur several weeks or months after stopping treatment and the onset of drug interactions may be slow after initiating amiodarone.   []  Statins: Increased risk of myopathy. Simvastatin- restrict dose to 20mg  daily. Other statins: counsel patients to report any muscle pain or weakness immediately.  []  Anticoagulants: Amiodarone can increase anticoagulant effect. Consider warfarin dose reduction. Patients should be monitored closely and the dose of anticoagulant altered accordingly, remembering that amiodarone levels take several weeks to stabilize.  []  Antiepileptics: Amiodarone can increase plasma concentration of phenytoin, the dose should be reduced. Note that small changes in phenytoin dose can result in large changes in levels. Monitor patient and counsel on signs of toxicity.  []  Beta blockers: increased risk of bradycardia, AV block and myocardial depression. Sotalol - avoid concomitant use.  []   Calcium channel blockers (diltiazem and verapamil): increased risk of bradycardia, AV block and myocardial depression.  []   Cyclosporine: Amiodarone increases levels of cyclosporine. Reduced dose of cyclosporine is recommended.  []  Digoxin dose should be halved when amiodarone is started.  []  Diuretics: increased risk of cardiotoxicity if hypokalemia occurs.  []  Oral hypoglycemic agents (glyburide, glipizide, glimepiride): increased risk of hypoglycemia. Patient's glucose levels should be monitored closely when initiating amiodarone therapy.   []  Drugs that prolong the QT interval:   Torsades de pointes risk may be increased with concurrent use - avoid if possible.  Monitor QTc, also keep magnesium/potassium WNL if concurrent therapy can't be avoided. Marland Kitchen Antibiotics: e.g. fluoroquinolones, erythromycin. . Antiarrhythmics: e.g. quinidine, procainamide, disopyramide, sotalol. . Antipsychotics: e.g. phenothiazines, haloperidol.  . Lithium, tricyclic antidepressants, and methadone. Thank You,  Norva Riffle  05/30/2016 9:14 PM

## 2016-05-31 ENCOUNTER — Inpatient Hospital Stay (HOSPITAL_COMMUNITY): Payer: Managed Care, Other (non HMO)

## 2016-05-31 LAB — GLUCOSE, CAPILLARY
GLUCOSE-CAPILLARY: 141 mg/dL — AB (ref 65–99)
Glucose-Capillary: 117 mg/dL — ABNORMAL HIGH (ref 65–99)
Glucose-Capillary: 119 mg/dL — ABNORMAL HIGH (ref 65–99)
Glucose-Capillary: 124 mg/dL — ABNORMAL HIGH (ref 65–99)
Glucose-Capillary: 126 mg/dL — ABNORMAL HIGH (ref 65–99)
Glucose-Capillary: 127 mg/dL — ABNORMAL HIGH (ref 65–99)

## 2016-05-31 LAB — BASIC METABOLIC PANEL
Anion gap: 9 (ref 5–15)
BUN: 10 mg/dL (ref 6–20)
CO2: 28 mmol/L (ref 22–32)
Calcium: 8.1 mg/dL — ABNORMAL LOW (ref 8.9–10.3)
Chloride: 94 mmol/L — ABNORMAL LOW (ref 101–111)
Creatinine, Ser: 0.66 mg/dL (ref 0.44–1.00)
GFR calc Af Amer: >60 mL/min (ref >60)
GFR calc non Af Amer: >60 mL/min (ref >60)
Glucose, Bld: 161 mg/dL — ABNORMAL HIGH (ref 65–99)
Potassium: 3.7 mmol/L (ref 3.5–5.1)
Sodium: 131 mmol/L — ABNORMAL LOW (ref 135–145)

## 2016-05-31 LAB — CBC
HCT: 26.4 % — ABNORMAL LOW (ref 36.0–46.0)
Hemoglobin: 8.8 g/dL — ABNORMAL LOW (ref 12.0–15.0)
MCH: 28.9 pg (ref 26.0–34.0)
MCHC: 33.3 g/dL (ref 30.0–36.0)
MCV: 86.6 fL (ref 78.0–100.0)
Platelets: 191 10*3/uL (ref 150–400)
RBC: 3.05 MIL/uL — ABNORMAL LOW (ref 3.87–5.11)
RDW: 14.5 % (ref 11.5–15.5)
WBC: 12.7 10*3/uL — ABNORMAL HIGH (ref 4.0–10.5)

## 2016-05-31 LAB — MAGNESIUM: Magnesium: 2 mg/dL (ref 1.7–2.4)

## 2016-05-31 MED ORDER — FUROSEMIDE 10 MG/ML IJ SOLN: 20.0000 mg | Freq: Every day | INTRAMUSCULAR | Status: DC

## 2016-05-31 MED ORDER — ENOXAPARIN SODIUM 40 MG/0.4ML ~~LOC~~ SOLN
40.0000 mg | Freq: Two times a day (BID) | SUBCUTANEOUS | Status: DC
  Administered 2016-05-31 – 2016-06-02 (×4): 40 mg via SUBCUTANEOUS
  Filled 2016-05-31 (×4): qty 0.4

## 2016-05-31 MED ORDER — METOPROLOL TARTRATE 25 MG PO TABS
25.0000 mg | ORAL_TABLET | Freq: Two times a day (BID) | ORAL | Status: DC
Start: 2016-05-31 — End: 2016-06-01
  Administered 2016-05-31: 25 mg via ORAL
  Filled 2016-05-31: qty 1

## 2016-05-31 MED ORDER — SODIUM CHLORIDE 0.9% FLUSH: 10.0000 mL | INTRAVENOUS | Status: DC | PRN

## 2016-05-31 MED ORDER — POTASSIUM CHLORIDE 10 MEQ/50ML IV SOLN
10.0000 meq | INTRAVENOUS | Status: AC
  Administered 2016-05-31 (×3): 10 meq via INTRAVENOUS
  Filled 2016-05-31 (×3): qty 50

## 2016-05-31 MED ORDER — DILTIAZEM LOAD VIA INFUSION
5.0000 mg | Freq: Once | INTRAVENOUS | Status: AC
  Administered 2016-05-31: 5 mg via INTRAVENOUS

## 2016-05-31 MED ORDER — SODIUM CHLORIDE 0.9% FLUSH
10.0000 mL | Freq: Two times a day (BID) | INTRAVENOUS | Status: DC
  Administered 2016-05-31 – 2016-06-09 (×6): 10 mL via INTRAVENOUS

## 2016-05-31 MED ORDER — DILTIAZEM HCL-DEXTROSE 100-5 MG/100ML-% IV SOLN (PREMIX)
5.0000 mg/h | INTRAVENOUS | Status: DC
  Administered 2016-05-31 (×2): 15 mg/h via INTRAVENOUS
  Administered 2016-05-31: 5 mg/h via INTRAVENOUS
  Administered 2016-06-01 (×3): 15 mg/h via INTRAVENOUS
  Administered 2016-06-02: 5 mg/h via INTRAVENOUS
  Administered 2016-06-02 – 2016-06-05 (×6): 10 mg/h via INTRAVENOUS
  Filled 2016-05-31 (×15): qty 100

## 2016-05-31 NOTE — Progress Notes (Signed)
3 Days Post-Op Procedure(s) (LRB): TRANSESOPHAGEAL ECHOCARDIOGRAM (TEE) (N/A) ATRIAL SEPTAL DEFECT (ASD) closure (N/A) MEDIAN STERNOTOMY (N/A) MAZE CLIPPING OF ATRIAL APPENDAGE Subjective: Having runs of rapid A. fib 170-180 Underlying heart rate now 70/m IV Cardizem time and amiodarone started to treat sustained fast A. Fib Will resume lower dose beta blocker orally now that underlying heart rate is adequate and pacemaker on backup Lovenox increased to twice a day  Objective: Vital signs in last 24 hours: Temp:  [97.5 F (36.4 C)-98.9 F (37.2 C)] 98.5 F (36.9 C) (10/15 1600) Pulse Rate:  [63-194] 63 (10/15 1900) Cardiac Rhythm: Atrial paced (10/15 1600) Resp:  [18-31] 22 (10/15 1900) BP: (92-131)/(51-106) 120/96 (10/15 1900) SpO2:  [80 %-100 %] 92 % (10/15 1900) Weight:  [287 lb 7.7 oz (130.4 kg)] 287 lb 7.7 oz (130.4 kg) (10/15 0400)  Hemodynamic parameters for last 24 hours:  sinus with runs of rapid atrial fibrillation  Intake/Output from previous day: 10/14 0701 - 10/15 0700 In: 1467.1 [P.O.:1080; I.V.:287.1; IV Piggyback:100] Out: [id number] [Urine:1645] Intake/Output this shift: No intake/output data recorded.       Exam    General- alert and comfortable   Lungs- clear without rales, wheezes   Cor- regular rate and rhythm, no murmur , gallop   Abdomen- soft, non-tender   Extremities - warm, non-tender, minimal edema   Neuro- oriented, appropriate, no focal weakness   Lab Results:  Recent Labs  05/30/16 0417 05/31/16 0345  WBC 14.2* 12.7*  HGB 9.1* 8.8*  HCT 28.0* 26.4*  PLT 161 191   BMET:  Recent Labs  05/30/16 0417 05/31/16 0345  NA 133* 131*  K 4.2 3.7  CL 99* 94*  CO2 26 28  GLUCOSE 125* 161*  BUN 13 10  CREATININE 0.81 0.66  CALCIUM 8.0* 8.1*    PT/INR: No results for input(s): LABPROT, INR in the last 72 hours. ABG    Component Value Date/Time   PHART 7.319 (L) 05/29/2016 0403   HCO3 21.9 05/29/2016 0403   TCO2 26 05/29/2016 1727    ACIDBASEDEF 4.0 (H) 05/29/2016 0403   O2SAT 92.0 05/29/2016 0403   CBG (last 3)   Recent Labs  05/31/16 0848 05/31/16 1218 05/31/16 1626  GLUCAP 124* 127* 126*    Assessment/Plan: S/P Procedure(s) (LRB): TRANSESOPHAGEAL ECHOCARDIOGRAM (TEE) (N/A) ATRIAL SEPTAL DEFECT (ASD) closure (N/A) MEDIAN STERNOTOMY (N/A) MAZE CLIPPING OF ATRIAL APPENDAGE Continue IV amiodarone and Cardizem for runs of rapid atrial fibrillation Increase anticoagulation-Lovenox twice a day Begin po Lopressor now that underlying heart rate is adequate  LOS: 3 days    Destiny Dennis 05/31/2016

## 2016-06-01 ENCOUNTER — Inpatient Hospital Stay (HOSPITAL_COMMUNITY): Payer: Managed Care, Other (non HMO)

## 2016-06-01 LAB — CBC
HCT: 20 % — ABNORMAL LOW (ref 36.0–46.0)
HCT: 25.8 % — ABNORMAL LOW (ref 36.0–46.0)
Hemoglobin: 6.6 g/dL — CL (ref 12.0–15.0)
Hemoglobin: 8.4 g/dL — ABNORMAL LOW (ref 12.0–15.0)
MCH: 28.6 pg (ref 26.0–34.0)
MCH: 28.1 pg (ref 26.0–34.0)
MCHC: 33 g/dL (ref 30.0–36.0)
MCHC: 32.6 g/dL (ref 30.0–36.0)
MCV: 86.6 fL (ref 78.0–100.0)
MCV: 86.3 fL (ref 78.0–100.0)
Platelets: 197 10*3/uL (ref 150–400)
Platelets: 249 10*3/uL (ref 150–400)
RBC: 2.31 MIL/uL — ABNORMAL LOW (ref 3.87–5.11)
RBC: 2.99 MIL/uL — ABNORMAL LOW (ref 3.87–5.11)
RDW: 14.6 % (ref 11.5–15.5)
RDW: 14.6 % (ref 11.5–15.5)
WBC: 9.9 10*3/uL (ref 4.0–10.5)
WBC: 11.8 10*3/uL — ABNORMAL HIGH (ref 4.0–10.5)

## 2016-06-01 LAB — BASIC METABOLIC PANEL
Anion gap: 9 (ref 5–15)
BUN: 8 mg/dL (ref 6–20)
CO2: 29 mmol/L (ref 22–32)
Calcium: 8.1 mg/dL — ABNORMAL LOW (ref 8.9–10.3)
Chloride: 95 mmol/L — ABNORMAL LOW (ref 101–111)
Creatinine, Ser: 0.6 mg/dL (ref 0.44–1.00)
GFR calc Af Amer: >60 mL/min (ref >60)
GFR calc non Af Amer: >60 mL/min (ref >60)
Glucose, Bld: 137 mg/dL — ABNORMAL HIGH (ref 65–99)
Potassium: 3.3 mmol/L — ABNORMAL LOW (ref 3.5–5.1)
Sodium: 133 mmol/L — ABNORMAL LOW (ref 135–145)

## 2016-06-01 LAB — GLUCOSE, CAPILLARY
GLUCOSE-CAPILLARY: 127 mg/dL — AB (ref 65–99)
GLUCOSE-CAPILLARY: 115 mg/dL — AB (ref 65–99)
Glucose-Capillary: 110 mg/dL — ABNORMAL HIGH (ref 65–99)
Glucose-Capillary: 144 mg/dL — ABNORMAL HIGH (ref 65–99)
Glucose-Capillary: 105 mg/dL — ABNORMAL HIGH (ref 65–99)

## 2016-06-01 MED ORDER — SODIUM CHLORIDE 0.9% FLUSH: 3.0000 mL | INTRAVENOUS | Status: DC | PRN

## 2016-06-01 MED ORDER — POTASSIUM CHLORIDE 10 MEQ/50ML IV SOLN
10.0000 meq | INTRAVENOUS | Status: AC
  Administered 2016-06-01 (×3): 10 meq via INTRAVENOUS
  Filled 2016-06-01 (×2): qty 50

## 2016-06-01 MED ORDER — POTASSIUM CHLORIDE CRYS ER 20 MEQ PO TBCR: 20.0000 meq | EXTENDED_RELEASE_TABLET | Freq: Two times a day (BID) | ORAL | Status: DC

## 2016-06-01 MED ORDER — SODIUM CHLORIDE 0.9 % IV SOLN: 250.0000 mL | INTRAVENOUS | Status: DC | PRN

## 2016-06-01 MED ORDER — INSULIN ASPART 100 UNIT/ML ~~LOC~~ SOLN
0.0000 [IU] | Freq: Three times a day (TID) | SUBCUTANEOUS | Status: DC
  Administered 2016-06-01 – 2016-06-02 (×2): 2 [IU] via SUBCUTANEOUS

## 2016-06-01 MED ORDER — FUROSEMIDE 40 MG PO TABS
40.0000 mg | ORAL_TABLET | Freq: Two times a day (BID) | ORAL | Status: DC
  Administered 2016-06-01 – 2016-06-02 (×3): 40 mg via ORAL
  Filled 2016-06-01 (×3): qty 1

## 2016-06-01 MED ORDER — MOVING RIGHT ALONG BOOK
Freq: Once | Status: AC
Start: 2016-06-01 — End: 2016-06-01
  Administered 2016-06-01: 09:00:00
  Filled 2016-06-01: qty 1

## 2016-06-01 MED ORDER — SODIUM CHLORIDE 0.9% FLUSH
3.0000 mL | Freq: Two times a day (BID) | INTRAVENOUS | Status: DC
  Administered 2016-06-02 – 2016-06-09 (×8): 3 mL via INTRAVENOUS

## 2016-06-01 MED ORDER — METOPROLOL TARTRATE 50 MG PO TABS
50.0000 mg | ORAL_TABLET | Freq: Two times a day (BID) | ORAL | Status: DC
  Administered 2016-06-01 (×2): 50 mg via ORAL
  Filled 2016-06-01 (×2): qty 1

## 2016-06-01 NOTE — Progress Notes (Signed)
      [address]       [place] [address]             [phone]        CARDIOTHORACIC SURGERY PROGRESS NOTE   R4 Days Post-Op Procedure(s) (LRB): TRANSESOPHAGEAL ECHOCARDIOGRAM (TEE) (N/A) ATRIAL SEPTAL DEFECT (ASD) closure (N/A) MEDIAN STERNOTOMY (N/A) MAZE CLIPPING OF ATRIAL APPENDAGE  Subjective: Looks good and feels well.  Mild soreness in chest.  Denies SOB.  Marginal appetite.  Objective: Vital signs: BP Readings from Last 1 Encounters:  06/01/16 (!) 122/58   Pulse Readings from Last 1 Encounters:  06/01/16 80   Resp Readings from Last 1 Encounters:  06/01/16 (!) 26   Temp Readings from Last 1 Encounters:  06/01/16 98.9 F (37.2 C) (Oral)    Hemodynamics:    Physical Exam:  Rhythm:   Junctional 60's  Breath sounds: clear  Heart sounds:  RRR  Incisions:  Clean and dry  Abdomen:  Soft, non-distended, non-tender  Extremities:  Warm, well-perfused   Intake/Output from previous day: 10/15 0701 - 10/16 0700 In: 994.2 [P.O.:120; I.V.:824.2; IV Piggyback:50] Out: 3075 [Urine:3075] Intake/Output this shift: Total I/O In: 50 [IV Piggyback:50] Out: 300 [Urine:300]  Lab Results:  CBC: Recent Labs  06/01/16 0428 06/01/16 0508  WBC 9.9 11.8*  HGB 6.6* 8.4*  HCT 20.0* 25.8*  PLT 197 249    BMET:  Recent Labs  05/31/16 0345 06/01/16 0508  NA 131* 133*  K 3.7 3.3*  CL 94* 95*  CO2 28 29  GLUCOSE 161* 137*  BUN 10 8  CREATININE 0.66 0.60  CALCIUM 8.1* 8.1*     PT/INR:  No results for input(s): LABPROT, INR in the last 72 hours.  CBG (last 3)   Recent Labs  05/31/16 2025 05/31/16 2346 06/01/16 0358  GLUCAP 141* 119* 127*    ABG    Component Value Date/Time   PHART 7.319 (L) 05/29/2016 0403   PCO2ART 42.1 05/29/2016 0403   PO2ART 65.0 (L) 05/29/2016 0403   HCO3 21.9 05/29/2016 0403   TCO2 26 05/29/2016 1727   ACIDBASEDEF 4.0 (H) 05/29/2016 0403   O2SAT 92.0 05/29/2016 0403    CXR: Mild bibasilar  atelectasis and pulm vasc congestion  Assessment/Plan: S/P Procedure(s) (LRB): TRANSESOPHAGEAL ECHOCARDIOGRAM (TEE) (N/A) ATRIAL SEPTAL DEFECT (ASD) closure (N/A) MEDIAN STERNOTOMY (N/A) MAZE CLIPPING OF ATRIAL APPENDAGE  Overall stable POD4 Currently maintaining junctional rhythm w/ stable BP on IV amiodarone and diltiazem Episodes of rapid AFib/Aflutter yesterday Expected post op acute blood loss anemia, Hgb 8.4 stable Chronic diastolic CHF with expected post-op volume excess, I/O 2 liters negative yesterday but weight still up 5 lbs Expected post op atelectasis, mild Hypokalemia - diuretic induced Morbid obesity   Mobilize  Diuresis  Change pacer to VVI backup  Increase metoprolol and wean diltiazem drip if HR decreases  Continue amiodarone drip for now  Continue lovenox  Restart Xarelto after pacing wires out   Rexene Alberts, MD 06/01/2016 8:37 AM

## 2016-06-01 NOTE — Progress Notes (Signed)
      [address]       [place] [address]             [phone]      Up in chair  BP 114/65   Pulse (!) 36   Temp 98.8 F (37.1 C) (Oral)   Resp (!) 22   Ht 5\' 11"  (1.803 m)   Wt 282 lb 13.6 oz (128.3 kg)   LMP 04/17/2012   SpO2 92%   BMI 39.45 kg/m    Intake/Output Summary (Last 24 hours) at 06/01/16 1847 Last data filed at 06/01/16 1600  Gross per 24 hour  Intake             1229 ml  Output             2225 ml  Net             -996 ml   On amiodarone and diltiazem drips at present  Remo Lipps C. Roxan Hockey, MD [facility] [phone]

## 2016-06-02 ENCOUNTER — Encounter (HOSPITAL_BASED_OUTPATIENT_CLINIC_OR_DEPARTMENT_OTHER): Payer: Managed Care, Other (non HMO)

## 2016-06-02 ENCOUNTER — Inpatient Hospital Stay (HOSPITAL_COMMUNITY): Payer: Managed Care, Other (non HMO)

## 2016-06-02 LAB — BASIC METABOLIC PANEL
Anion gap: 7 (ref 5–15)
Anion gap: 11 (ref 5–15)
BUN: 8 mg/dL (ref 6–20)
BUN: 6 mg/dL (ref 6–20)
CALCIUM: 8.1 mg/dL — AB (ref 8.9–10.3)
CHLORIDE: 98 mmol/L — AB (ref 101–111)
CHLORIDE: 96 mmol/L — AB (ref 101–111)
CO2: 30 mmol/L (ref 22–32)
CO2: 26 mmol/L (ref 22–32)
CREATININE: 0.69 mg/dL (ref 0.44–1.00)
Calcium: 8.7 mg/dL — ABNORMAL LOW (ref 8.9–10.3)
Creatinine, Ser: 0.7 mg/dL (ref 0.44–1.00)
GFR calc Af Amer: >60 mL/min (ref >60)
GFR calc non Af Amer: >60 mL/min (ref >60)
GFR calc non Af Amer: >60 mL/min (ref >60)
GLUCOSE: 124 mg/dL — AB (ref 65–99)
Glucose, Bld: 98 mg/dL (ref 65–99)
POTASSIUM: 3.9 mmol/L (ref 3.5–5.1)
Potassium: 3 mmol/L — ABNORMAL LOW (ref 3.5–5.1)
SODIUM: 135 mmol/L (ref 135–145)
SODIUM: 133 mmol/L — AB (ref 135–145)

## 2016-06-02 LAB — GLUCOSE, CAPILLARY
GLUCOSE-CAPILLARY: 111 mg/dL — AB (ref 65–99)
Glucose-Capillary: 100 mg/dL — ABNORMAL HIGH (ref 65–99)
Glucose-Capillary: 114 mg/dL — ABNORMAL HIGH (ref 65–99)

## 2016-06-02 LAB — CBC
HCT: 24.7 % — ABNORMAL LOW (ref 36.0–46.0)
HEMOGLOBIN: 8.3 g/dL — AB (ref 12.0–15.0)
MCH: 28.7 pg (ref 26.0–34.0)
MCHC: 33.6 g/dL (ref 30.0–36.0)
MCV: 85.5 fL (ref 78.0–100.0)
Platelets: 266 10*3/uL (ref 150–400)
RBC: 2.89 MIL/uL — ABNORMAL LOW (ref 3.87–5.11)
RDW: 14.8 % (ref 11.5–15.5)
WBC: 8.8 10*3/uL (ref 4.0–10.5)

## 2016-06-02 LAB — MAGNESIUM: MAGNESIUM: 1.8 mg/dL (ref 1.7–2.4)

## 2016-06-02 MED ORDER — METOPROLOL TARTRATE 100 MG PO TABS
100.0000 mg | ORAL_TABLET | Freq: Two times a day (BID) | ORAL | Status: DC
  Administered 2016-06-02 – 2016-06-05 (×8): 100 mg via ORAL
  Filled 2016-06-02 (×3): qty 1
  Filled 2016-06-02: qty 2
  Filled 2016-06-02 (×2): qty 1
  Filled 2016-06-02 (×2): qty 2

## 2016-06-02 MED ORDER — AMIODARONE IV BOLUS ONLY 150 MG/100ML
150.0000 mg | Freq: Once | INTRAVENOUS | Status: AC
  Administered 2016-06-02: 150 mg via INTRAVENOUS

## 2016-06-02 MED ORDER — MAGNESIUM SULFATE 2 GM/50ML IV SOLN
2.0000 g | Freq: Once | INTRAVENOUS | Status: AC
  Administered 2016-06-02: 2 g via INTRAVENOUS
  Filled 2016-06-02: qty 50

## 2016-06-02 MED ORDER — POTASSIUM CHLORIDE CRYS ER 20 MEQ PO TBCR
40.0000 meq | EXTENDED_RELEASE_TABLET | Freq: Three times a day (TID) | ORAL | Status: DC
  Administered 2016-06-02 (×3): 40 meq via ORAL
  Filled 2016-06-02 (×3): qty 2

## 2016-06-02 MED ORDER — FUROSEMIDE 40 MG PO TABS
40.0000 mg | ORAL_TABLET | Freq: Two times a day (BID) | ORAL | Status: DC
  Administered 2016-06-03: 40 mg via ORAL
  Filled 2016-06-02: qty 1

## 2016-06-02 MED ORDER — POLYSACCHARIDE IRON COMPLEX 150 MG PO CAPS
150.0000 mg | ORAL_CAPSULE | Freq: Every day | ORAL | Status: DC
  Administered 2016-06-02 – 2016-06-09 (×7): 150 mg via ORAL
  Filled 2016-06-02 (×8): qty 1

## 2016-06-02 MED ORDER — FUROSEMIDE 10 MG/ML IJ SOLN
40.0000 mg | Freq: Two times a day (BID) | INTRAMUSCULAR | Status: AC
  Administered 2016-06-02 (×2): 40 mg via INTRAVENOUS
  Filled 2016-06-02: qty 4

## 2016-06-02 MED ORDER — POTASSIUM CHLORIDE CRYS ER 20 MEQ PO TBCR
20.0000 meq | EXTENDED_RELEASE_TABLET | ORAL | Status: AC
  Administered 2016-06-02 (×3): 20 meq via ORAL
  Filled 2016-06-02 (×2): qty 1

## 2016-06-02 MED ORDER — POTASSIUM CHLORIDE CRYS ER 20 MEQ PO TBCR
40.0000 meq | EXTENDED_RELEASE_TABLET | Freq: Once | ORAL | Status: AC
  Administered 2016-06-02: 40 meq via ORAL
  Filled 2016-06-02: qty 2

## 2016-06-02 MED ORDER — FA-PYRIDOXINE-CYANOCOBALAMIN 2.5-25-2 MG PO TABS
1.0000 | ORAL_TABLET | Freq: Every day | ORAL | Status: DC
  Administered 2016-06-02 – 2016-06-09 (×7): 1 via ORAL
  Filled 2016-06-02 (×8): qty 1

## 2016-06-02 MED ORDER — HEPARIN (PORCINE) IN NACL 100-0.45 UNIT/ML-% IJ SOLN
2600.0000 [IU]/h | INTRAMUSCULAR | Status: AC
  Administered 2016-06-02: 1250 [IU]/h via INTRAVENOUS
  Administered 2016-06-04: 2100 [IU]/h via INTRAVENOUS
  Administered 2016-06-05 (×2): 2600 [IU]/h via INTRAVENOUS
  Filled 2016-06-02 (×7): qty 250

## 2016-06-02 MED ORDER — SALINE SPRAY 0.65 % NA SOLN
1.0000 | NASAL | Status: DC | PRN
  Administered 2016-06-02: 1 via NASAL
  Filled 2016-06-02: qty 44

## 2016-06-02 NOTE — Progress Notes (Signed)
ANTICOAGULATION CONSULT NOTE - Initial Consult  Pharmacy Consult for Heparin Indication: atrial fibrillation  Allergies  Allergen Reactions  . Avelox [Moxifloxacin Hcl In Nacl] Other (See Comments)    Numbness & tingling Peripheral neuropathy    Patient Measurements: Height: 5\' 11"  (180.3 cm) Weight: 283 lb 1.1 oz (128.4 kg) IBW/kg (Calculated) : 70.8 Heparin Dosing Weight: 102 kg  Vital Signs: Temp: 98.3 F (36.8 C) (10/17 1130) Temp Source: Oral (10/17 1130) BP: 113/61 (10/17 1400) Pulse Rate: 133 (10/17 1445)  Labs:  Recent Labs  05/31/16 0345 06/01/16 0428 06/01/16 0508 06/02/16 0237  HGB 8.8* 6.6* 8.4* 8.3*  HCT 26.4* 20.0* 25.8* 24.7*  PLT 191 197 249 266  CREATININE 0.66  --  0.60 0.69    Estimated Creatinine Clearance: 120.4 mL/min (by C-G formula based on SCr of 0.69 mg/dL).   Medical History: Past Medical History:  Diagnosis Date  . Allergic rhinitis   . Anxiety    when tachycardia occurs  . Arthritis    OA, s/p numerous surgeries -back, knees, hip  . Atrial flutter ([place])   . Atrial septal defect 01/01/2016   Discovered on TEE   . Benign fundic gland polyps of stomach   . Cervical disc disease   . Complication of anesthesia   . Diverticulitis    CT Scan   . DVT (deep venous thrombosis) ([place])    in pregnancy, s/p hip surgery  . Eustachian tube dysfunction   . GERD 12/23/2009   diet related-not a problem  . Headache(784.0)    migraines occ.  Marland Kitchen Hemorrhoids   . Hiatal hernia    small  . History of MRSA infection 2008   superficial skin-cleared easily with doxycycline  . Hypertension   . Obesity   . Peripheral vascular disease ([place])    left leg after hip surgery  . Persistent atrial fibrillation ([place]) 03/05/2007   a. s/p RFCA 12/18/2011, 07/29/12  . PONV (postoperative nausea and vomiting)   . s/p atrial septal defect repair 05/28/2016  . S/P Maze operation for atrial fibrillation 05/28/2016   Complete bilateral atrial lesion set using  cryothermy and bipolar radiofrequency ablation with clipping of LA appendage via median sternotomy  . Sialoadenitis   . SVT (supraventricular tachycardia) ([place]) 03/18/2010   PVCs    Assessment: 55 year old female on Xarelto prior to admission for Afib, now s/p open heart surgery, to start heparin for Afib  Goal of Therapy:  Heparin level 0.3-0.7 units/ml Monitor platelets by anticoagulation protocol: Yes   Plan:  Heparin at 1250 units / hr 8 hour heparin level Daily heparin level, CBC  Thank you Anette Guarneri, PharmD [phone]  06/02/2016,2:57 PM

## 2016-06-02 NOTE — Progress Notes (Signed)
Pt experienced another pause lasting about 3.8 seconds (printed strip in chart), so temp pacemaker turned back on at a VVI backup at rate of 60bpm.

## 2016-06-02 NOTE — Progress Notes (Addendum)
      [address]       [place] [address]             [phone]        CARDIOTHORACIC SURGERY PROGRESS NOTE   R5 Days Post-Op Procedure(s) (LRB): TRANSESOPHAGEAL ECHOCARDIOGRAM (TEE) (N/A) ATRIAL SEPTAL DEFECT (ASD) closure (N/A) MEDIAN STERNOTOMY (N/A) MAZE CLIPPING OF ATRIAL APPENDAGE  Subjective: Had a bad night.  Couldn't sleep. Misses her family.  Minimal pain.  No SOB  Objective: Vital signs: BP Readings from Last 1 Encounters:  06/02/16 107/62   Pulse Readings from Last 1 Encounters:  06/02/16 (!) 141   Resp Readings from Last 1 Encounters:  06/02/16 (!) 24   Temp Readings from Last 1 Encounters:  06/02/16 97.9 F (36.6 C) (Oral)    Hemodynamics:    Physical Exam:  Rhythm:   Afib w/ HR 120  Breath sounds: clear  Heart sounds:  irregular  Incisions:  Clean and dry  Abdomen:  Soft, non-distended, non-tender  Extremities:  Warm, well-perfused    Intake/Output from previous day: 10/16 0701 - 10/17 0700 In: 1424.6 [P.O.:480; I.V.:694.6; IV Piggyback:250] Out: 1850 [Urine:1850] Intake/Output this shift: No intake/output data recorded.  Lab Results:  CBC: Recent Labs  06/01/16 0508 06/02/16 0237  WBC 11.8* 8.8  HGB 8.4* 8.3*  HCT 25.8* 24.7*  PLT 249 266    BMET:  Recent Labs  06/01/16 0508 06/02/16 0237  NA 133* 135  K 3.3* 3.0*  CL 95* 98*  CO2 29 30  GLUCOSE 137* 98  BUN 8 8  CREATININE 0.60 0.69  CALCIUM 8.1* 8.1*     PT/INR:  No results for input(s): LABPROT, INR in the last 72 hours.  CBG (last 3)   Recent Labs  06/01/16 1234 06/01/16 1624 06/01/16 2217  GLUCAP 144* 105* 115*    ABG    Component Value Date/Time   PHART 7.319 (L) 05/29/2016 0403   PCO2ART 42.1 05/29/2016 0403   PO2ART 65.0 (L) 05/29/2016 0403   HCO3 21.9 05/29/2016 0403   TCO2 26 05/29/2016 1727   ACIDBASEDEF 4.0 (H) 05/29/2016 0403   O2SAT 92.0 05/29/2016 0403    CXR: n/a  Assessment/Plan: S/P Procedure(s)  (LRB): TRANSESOPHAGEAL ECHOCARDIOGRAM (TEE) (N/A) ATRIAL SEPTAL DEFECT (ASD) closure (N/A) MEDIAN STERNOTOMY (N/A) MAZE CLIPPING OF ATRIAL APPENDAGE  Overall stable POD4 but back in rapid Afib this morning Remains on IV amiodarone and lower dose diltiazem Expected post op acute blood loss anemia, Hgb 8.3 stable Chronic diastolic CHF with expected post-op volume excess, I/O 425 mL negative yesterday but weight still up 6 lbs Expected post op atelectasis, mild Hypokalemia - diuretic induced Morbid obesity   Mobilize  Diuresis  Increase metoprolol to preop dose and extra bolus amiodarone  Supplement potassium   Continue Cardizem drip for now and titrate as needed  Continue lovenox  Restart Xarelto after pacing wires out  Awaiting bed on step down unit for transfer  Rexene Alberts, MD 06/02/2016 7:59 AM

## 2016-06-02 NOTE — Progress Notes (Signed)
Patient, who has been in A-fib all morning with a ventricular rate in the 110-120's, experienced a pause in heart rate, lasting 2.6 seconds, at 9:01am (printed strip in chart). Pt complained of profound dizziness during this episode while sitting in the chair. Following the episode, patient was in [place] with a rate in the 60's for about 5 minutes, but then returned to A-fib with ventricular rate in the 120's. Another pause occurred at 9:34am, which lasted 2.4 seconds (printed strip in chart). Pt c/o of dizziness again with this episode. Order was placed this am by Dr Roxy Manns to D/C pt's temp pacing wires, as to be able to restart her Xarelto, but will wait on this until able to consult with Dr Roxy Manns following his surgical case this morning. Decision to wait was discussed with and confirmed by Dr Servando Snare who was available on the unit. Will continue to monitor pt closely.

## 2016-06-03 LAB — CUP PACEART REMOTE DEVICE CHECK: Date Time Interrogation Session: 20170921140544

## 2016-06-03 LAB — CBC
HCT: 25.6 % — ABNORMAL LOW (ref 36.0–46.0)
HEMOGLOBIN: 8.4 g/dL — AB (ref 12.0–15.0)
MCH: 28.2 pg (ref 26.0–34.0)
MCHC: 32.8 g/dL (ref 30.0–36.0)
MCV: 85.9 fL (ref 78.0–100.0)
Platelets: 336 10*3/uL (ref 150–400)
RBC: 2.98 MIL/uL — ABNORMAL LOW (ref 3.87–5.11)
RDW: 14.7 % (ref 11.5–15.5)
WBC: 11.2 10*3/uL — ABNORMAL HIGH (ref 4.0–10.5)

## 2016-06-03 LAB — BASIC METABOLIC PANEL
ANION GAP: 9 (ref 5–15)
Anion gap: 9 (ref 5–15)
BUN: 9 mg/dL (ref 6–20)
BUN: 6 mg/dL (ref 6–20)
CALCIUM: 8.3 mg/dL — AB (ref 8.9–10.3)
CHLORIDE: 97 mmol/L — AB (ref 101–111)
CO2: 25 mmol/L (ref 22–32)
CO2: 29 mmol/L (ref 22–32)
CREATININE: 0.72 mg/dL (ref 0.44–1.00)
Calcium: 8.3 mg/dL — ABNORMAL LOW (ref 8.9–10.3)
Chloride: 97 mmol/L — ABNORMAL LOW (ref 101–111)
Creatinine, Ser: 0.74 mg/dL (ref 0.44–1.00)
GFR calc Af Amer: >60 mL/min (ref >60)
GFR calc Af Amer: >60 mL/min (ref >60)
GFR calc non Af Amer: >60 mL/min (ref >60)
GFR calc non Af Amer: >60 mL/min (ref >60)
GLUCOSE: 114 mg/dL — AB (ref 65–99)
GLUCOSE: 113 mg/dL — AB (ref 65–99)
POTASSIUM: 3.8 mmol/L (ref 3.5–5.1)
Potassium: 3.6 mmol/L (ref 3.5–5.1)
Sodium: 135 mmol/L (ref 135–145)
Sodium: 131 mmol/L — ABNORMAL LOW (ref 135–145)

## 2016-06-03 LAB — HEPARIN LEVEL (UNFRACTIONATED)
HEPARIN UNFRACTIONATED: 0.16 [IU]/mL — AB (ref 0.30–0.70)
Heparin Unfractionated: 0.16 IU/mL — ABNORMAL LOW (ref 0.30–0.70)
Heparin Unfractionated: 0.24 IU/mL — ABNORMAL LOW (ref 0.30–0.70)

## 2016-06-03 LAB — GLUCOSE, CAPILLARY: Glucose-Capillary: 124 mg/dL — ABNORMAL HIGH (ref 65–99)

## 2016-06-03 MED ORDER — POTASSIUM CHLORIDE CRYS ER 20 MEQ PO TBCR
20.0000 meq | EXTENDED_RELEASE_TABLET | ORAL | Status: AC
  Administered 2016-06-03 (×2): 20 meq via ORAL
  Filled 2016-06-03 (×2): qty 1

## 2016-06-03 MED ORDER — FUROSEMIDE 10 MG/ML IJ SOLN
40.0000 mg | Freq: Two times a day (BID) | INTRAMUSCULAR | Status: AC
  Administered 2016-06-03 (×2): 40 mg via INTRAVENOUS
  Filled 2016-06-03 (×2): qty 4

## 2016-06-03 MED ORDER — AMIODARONE IV BOLUS ONLY 150 MG/100ML
150.0000 mg | Freq: Once | INTRAVENOUS | Status: AC
  Administered 2016-06-03: 150 mg via INTRAVENOUS
  Filled 2016-06-03: qty 100

## 2016-06-03 MED ORDER — LORATADINE 10 MG PO TABS
10.0000 mg | ORAL_TABLET | Freq: Every day | ORAL | Status: DC | PRN
  Administered 2016-06-03: 10 mg via ORAL
  Filled 2016-06-03: qty 1

## 2016-06-03 MED ORDER — ACETAMINOPHEN 500 MG PO TABS
1000.0000 mg | ORAL_TABLET | Freq: Four times a day (QID) | ORAL | Status: DC | PRN
  Administered 2016-06-05 – 2016-06-09 (×2): 1000 mg via ORAL
  Filled 2016-06-03 (×2): qty 2

## 2016-06-03 MED ORDER — POTASSIUM CHLORIDE CRYS ER 20 MEQ PO TBCR: 40.0000 meq | EXTENDED_RELEASE_TABLET | Freq: Three times a day (TID) | ORAL | Status: DC

## 2016-06-03 MED ORDER — FUROSEMIDE 40 MG PO TABS
40.0000 mg | ORAL_TABLET | Freq: Two times a day (BID) | ORAL | Status: DC
  Administered 2016-06-04 – 2016-06-09 (×9): 40 mg via ORAL
  Filled 2016-06-03 (×9): qty 1

## 2016-06-03 MED ORDER — POTASSIUM CHLORIDE CRYS ER 20 MEQ PO TBCR
40.0000 meq | EXTENDED_RELEASE_TABLET | Freq: Three times a day (TID) | ORAL | Status: DC
  Administered 2016-06-03 – 2016-06-08 (×12): 40 meq via ORAL
  Filled 2016-06-03 (×14): qty 2

## 2016-06-03 MED ORDER — AMIODARONE HCL 200 MG PO TABS
400.0000 mg | ORAL_TABLET | Freq: Three times a day (TID) | ORAL | Status: DC
  Administered 2016-06-03 – 2016-06-04 (×6): 400 mg via ORAL
  Filled 2016-06-03 (×6): qty 2

## 2016-06-03 NOTE — Progress Notes (Signed)
Patient ambulated from 2S09 to 2W24 with wheelchair and standby assist., patient tolerated walk fairly well-became a little nauseous-PRN zofran given at bedside, patient in chair with receiving RN in room, patient placed on tele, VS stable, EPW attached to pacer at a back up rate of 60, call bell in reach. No questions from patient or receiving RN.  Rowe Pavy, RN

## 2016-06-03 NOTE — Progress Notes (Signed)
      [address]       [place] [address]             [phone]        CARDIOTHORACIC SURGERY PROGRESS NOTE   R6 Days Post-Op Procedure(s) (LRB): TRANSESOPHAGEAL ECHOCARDIOGRAM (TEE) (N/A) ATRIAL SEPTAL DEFECT (ASD) closure (N/A) MEDIAN STERNOTOMY (N/A) MAZE CLIPPING OF ATRIAL APPENDAGE  Subjective: Had a good night.  Feels better.  Objective: Vital signs: BP Readings from Last 1 Encounters:  06/03/16 123/79   Pulse Readings from Last 1 Encounters:  06/03/16 (!) 103   Resp Readings from Last 1 Encounters:  06/03/16 (!) 26   Temp Readings from Last 1 Encounters:  06/03/16 98.7 F (37.1 C) (Oral)    Hemodynamics:    Physical Exam:  Rhythm:   Afib w/ HR 100-120  Breath sounds: clear  Heart sounds:  irregular  Incisions:  Clean and dry  Abdomen:  Soft, non-distended, non-tender  Extremities:  Warm, well-perfused    Intake/Output from previous day: 10/17 0701 - 10/18 0700 In: 1497.2 [P.O.:720; I.V.:727.2; IV Piggyback:50] Out: 3200 [Urine:3200] Intake/Output this shift: No intake/output data recorded.  Lab Results:  CBC: Recent Labs  06/02/16 0237 06/03/16 0033  WBC 8.8 11.2*  HGB 8.3* 8.4*  HCT 24.7* 25.6*  PLT 266 336    BMET:  Recent Labs  06/02/16 1714 06/03/16 0033  NA 133* 135  K 3.9 3.6  CL 96* 97*  CO2 26 29  GLUCOSE 124* 113*  BUN 6 6  CREATININE 0.70 0.72  CALCIUM 8.7* 8.3*     PT/INR:  No results for input(s): LABPROT, INR in the last 72 hours.  CBG (last 3)   Recent Labs  06/02/16 0800 06/02/16 1158 06/02/16 1555  GLUCAP 111* 100* 114*    ABG    Component Value Date/Time   PHART 7.319 (L) 05/29/2016 0403   PCO2ART 42.1 05/29/2016 0403   PO2ART 65.0 (L) 05/29/2016 0403   HCO3 21.9 05/29/2016 0403   TCO2 26 05/29/2016 1727   ACIDBASEDEF 4.0 (H) 05/29/2016 0403   O2SAT 92.0 05/29/2016 0403    CXR: n/a  Assessment/Plan: S/P Procedure(s) (LRB): TRANSESOPHAGEAL ECHOCARDIOGRAM (TEE)  (N/A) ATRIAL SEPTAL DEFECT (ASD) closure (N/A) MEDIAN STERNOTOMY (N/A) MAZE CLIPPING OF ATRIAL APPENDAGE  Overall stable POD5 but still in Afib this morning Remains on IV amiodarone and diltiazem Expected post op acute blood loss anemia, Hgb 8.4 stable Chronic diastolic CHF with expected post-op volume excess, I/O 1700 mL negative yesterday but weight still up 7 lbs Expected post op atelectasis, mild Hypokalemia - diuretic induced Morbid obesity   Mobilize  Diuresis  Another extra bolus amiodarone and start oral  Supplement potassium   Continue Cardizem drip for now and titrate as needed  Continue heparin  Restart Xarelto after pacing wires out  Awaiting bed on step down unit for transfer  Rexene Alberts, MD 06/03/2016 8:20 AM

## 2016-06-03 NOTE — Progress Notes (Signed)
[facility] for Heparin Indication: atrial fibrillation  Allergies  Allergen Reactions  . Avelox [Moxifloxacin Hcl In Nacl] Other (See Comments)    Numbness & tingling Peripheral neuropathy    Patient Measurements: Height: 5\' 11"  (180.3 cm) Weight: 284 lb 6.3 oz (129 kg) IBW/kg (Calculated) : 70.8 Heparin Dosing Weight: 102 kg  Vital Signs: Temp: 99.3 F (37.4 C) (10/18 1946) Temp Source: Oral (10/18 1946) BP: 99/74 (10/18 2156) Pulse Rate: 119 (10/18 2156)  Labs:  Recent Labs  06/01/16 0508 06/02/16 0237 06/02/16 1714 06/03/16 0033 06/03/16 1210 06/03/16 1614 06/03/16 2121  HGB 8.4* 8.3*  --  8.4*  --   --   --   HCT 25.8* 24.7*  --  25.6*  --   --   --   PLT 249 266  --  336  --   --   --   HEPARINUNFRC  --   --   --  0.16* 0.16*  --  0.24*  CREATININE 0.60 0.69 0.70 0.72  --  0.74  --     Estimated Creatinine Clearance: 120.8 mL/min (by C-G formula based on SCr of 0.74 mg/dL).   Medical History: Past Medical History:  Diagnosis Date  . Allergic rhinitis   . Anxiety    when tachycardia occurs  . Arthritis    OA, s/p numerous surgeries -back, knees, hip  . Atrial flutter ([place])   . Atrial septal defect 01/01/2016   Discovered on TEE   . Benign fundic gland polyps of stomach   . Cervical disc disease   . Complication of anesthesia   . Diverticulitis    CT Scan   . DVT (deep venous thrombosis) ([place])    in pregnancy, s/p hip surgery  . Eustachian tube dysfunction   . GERD 12/23/2009   diet related-not a problem  . Headache(784.0)    migraines occ.  Marland Kitchen Hemorrhoids   . Hiatal hernia    small  . History of MRSA infection 2008   superficial skin-cleared easily with doxycycline  . Hypertension   . Obesity   . Peripheral vascular disease ([place])    left leg after hip surgery  . Persistent atrial fibrillation ([place]) 03/05/2007   a. s/p RFCA 12/18/2011, 07/29/12  . PONV (postoperative nausea and vomiting)   . s/p atrial  septal defect repair 05/28/2016  . S/P Maze operation for atrial fibrillation 05/28/2016   Complete bilateral atrial lesion set using cryothermy and bipolar radiofrequency ablation with clipping of LA appendage via median sternotomy  . Sialoadenitis   . SVT (supraventricular tachycardia) ([place]) 03/18/2010   PVCs    Assessment: 55 year old female on Xarelto prior to admission for Afib, now s/p open heart surgery, and  started heparin for Afib  Heparin drip 1750 uts/hr HL 0.24 remains slightly less than goal.  CBC low stable.   Goal of Therapy:  Heparin level 0.3-0.7 units/ml Monitor platelets by anticoagulation protocol: Yes   Plan:  Heparin to increase 1800 units / hr  Daily heparin level, CBC  Bonnita Nasuti Pharm.D. CPP, BCPS Clinical Pharmacist [phone] 06/03/2016 10:22 PM

## 2016-06-03 NOTE — Progress Notes (Signed)
Received pt from [place], Therapist, sports. Patient placed in chair. Oriented to room. Vitals WNL. Call bell within reach. Will continue to monitor.  Destiny Dennis

## 2016-06-03 NOTE — Progress Notes (Signed)
[facility] for Heparin Indication: atrial fibrillation  Allergies  Allergen Reactions  . Avelox [Moxifloxacin Hcl In Nacl] Other (See Comments)    Numbness & tingling Peripheral neuropathy    Patient Measurements: Height: 5\' 11"  (180.3 cm) Weight: 284 lb 6.3 oz (129 kg) IBW/kg (Calculated) : 70.8 Heparin Dosing Weight: 102 kg  Vital Signs: Temp: 98.3 F (36.8 C) (10/18 0700) Temp Source: Oral (10/18 0400) BP: 104/60 (10/18 1013) Pulse Rate: 106 (10/18 1013)  Labs:  Recent Labs  06/01/16 0508 06/02/16 0237 06/02/16 1714 06/03/16 0033 06/03/16 1210  HGB 8.4* 8.3*  --  8.4*  --   HCT 25.8* 24.7*  --  25.6*  --   PLT 249 266  --  336  --   HEPARINUNFRC  --   --   --  0.16* 0.16*  CREATININE 0.60 0.69 0.70 0.72  --     Estimated Creatinine Clearance: 120.8 mL/min (by C-G formula based on SCr of 0.72 mg/dL).   Medical History: Past Medical History:  Diagnosis Date  . Allergic rhinitis   . Anxiety    when tachycardia occurs  . Arthritis    OA, s/p numerous surgeries -back, knees, hip  . Atrial flutter ([place])   . Atrial septal defect 01/01/2016   Discovered on TEE   . Benign fundic gland polyps of stomach   . Cervical disc disease   . Complication of anesthesia   . Diverticulitis    CT Scan   . DVT (deep venous thrombosis) ([place])    in pregnancy, s/p hip surgery  . Eustachian tube dysfunction   . GERD 12/23/2009   diet related-not a problem  . Headache(784.0)    migraines occ.  Marland Kitchen Hemorrhoids   . Hiatal hernia    small  . History of MRSA infection 2008   superficial skin-cleared easily with doxycycline  . Hypertension   . Obesity   . Peripheral vascular disease ([place])    left leg after hip surgery  . Persistent atrial fibrillation ([place]) 03/05/2007   a. s/p RFCA 12/18/2011, 07/29/12  . PONV (postoperative nausea and vomiting)   . s/p atrial septal defect repair 05/28/2016  . S/P Maze operation for atrial fibrillation  05/28/2016   Complete bilateral atrial lesion set using cryothermy and bipolar radiofrequency ablation with clipping of LA appendage via median sternotomy  . Sialoadenitis   . SVT (supraventricular tachycardia) ([place]) 03/18/2010   PVCs    Assessment: 55 year old female on Xarelto prior to admission for Afib, now s/p open heart surgery, to start heparin for Afib  Heparin level still low this PM at 0.16  Goal of Therapy:  Heparin level 0.3-0.7 units/ml Monitor platelets by anticoagulation protocol: Yes   Plan:  Heparin to 1750 units / hr 8 hour heparin level Daily heparin level, CBC  Thank you Anette Guarneri, PharmD [phone]  06/03/2016,12:48 PM

## 2016-06-03 NOTE — Progress Notes (Signed)
[facility] for Heparin Indication: atrial fibrillation  Allergies  Allergen Reactions  . Avelox [Moxifloxacin Hcl In Nacl] Other (See Comments)    Numbness & tingling Peripheral neuropathy    Patient Measurements: Height: 5\' 11"  (180.3 cm) Weight: 283 lb 1.1 oz (128.4 kg) IBW/kg (Calculated) : 70.8 Heparin Dosing Weight: 102 kg  Vital Signs: Temp: 98.6 F (37 C) (10/18 0000) Temp Source: Oral (10/18 0000) BP: 117/72 (10/18 0100) Pulse Rate: 116 (10/18 0100)  Labs:  Recent Labs  06/01/16 0508 06/02/16 0237 06/02/16 1714 06/03/16 0033  HGB 8.4* 8.3*  --  8.4*  HCT 25.8* 24.7*  --  25.6*  PLT 249 266  --  336  HEPARINUNFRC  --   --   --  0.16*  CREATININE 0.60 0.69 0.70 0.72    Estimated Creatinine Clearance: 120.4 mL/min (by C-G formula based on SCr of 0.72 mg/dL).  Assessment: 55 year old female with Afib s/p ASD repair and MAZE procedure 10/12, for heparin Goal of Therapy:  Heparin level 0.3-0.7 units/ml Monitor platelets by anticoagulation protocol: Yes   Plan:  Increase Heparin 1550 units/hr Follow-up am labs.   Phillis Knack, PharmD, BCPS  06/03/2016,1:35 AM

## 2016-06-03 NOTE — Progress Notes (Signed)
1415-140 Pt up to bathroom and stated she has walked twice already today and she is tired. Encouraged her to walk once more before bedtime. Got pt 2 more pillows, room deodorizer, sheet for chair and new IS (her IS had fallen on floor). Set up chair and put pillows under each arm and her feet. Encouraged IS. Pt stated she has been using IS. Pt receptive to mobility and IS as she knows these are necessary for recovery. Will follow up tomorrow. Graylon Good RN BSN 06/03/2016 2:41 PM

## 2016-06-04 LAB — BASIC METABOLIC PANEL
ANION GAP: 8 (ref 5–15)
BUN: 9 mg/dL (ref 6–20)
CHLORIDE: 98 mmol/L — AB (ref 101–111)
CO2: 29 mmol/L (ref 22–32)
CREATININE: 0.83 mg/dL (ref 0.44–1.00)
Calcium: 8.5 mg/dL — ABNORMAL LOW (ref 8.9–10.3)
GFR calc non Af Amer: >60 mL/min (ref >60)
Glucose, Bld: 104 mg/dL — ABNORMAL HIGH (ref 65–99)
POTASSIUM: 4.2 mmol/L (ref 3.5–5.1)
Sodium: 135 mmol/L (ref 135–145)

## 2016-06-04 LAB — CBC
HCT: 24.5 % — ABNORMAL LOW (ref 36.0–46.0)
HEMOGLOBIN: 8.1 g/dL — AB (ref 12.0–15.0)
MCH: 28.4 pg (ref 26.0–34.0)
MCHC: 33.1 g/dL (ref 30.0–36.0)
MCV: 86 fL (ref 78.0–100.0)
Platelets: 396 10*3/uL (ref 150–400)
RBC: 2.85 MIL/uL — ABNORMAL LOW (ref 3.87–5.11)
RDW: 14.9 % (ref 11.5–15.5)
WBC: 12.8 10*3/uL — AB (ref 4.0–10.5)

## 2016-06-04 LAB — HEPARIN LEVEL (UNFRACTIONATED)
HEPARIN UNFRACTIONATED: 0.23 [IU]/mL — AB (ref 0.30–0.70)
HEPARIN UNFRACTIONATED: 0.25 [IU]/mL — AB (ref 0.30–0.70)
Heparin Unfractionated: 0.22 IU/mL — ABNORMAL LOW (ref 0.30–0.70)

## 2016-06-04 LAB — MAGNESIUM: MAGNESIUM: 1.9 mg/dL (ref 1.7–2.4)

## 2016-06-04 MED ORDER — DILTIAZEM HCL ER COATED BEADS 180 MG PO CP24
180.0000 mg | ORAL_CAPSULE | Freq: Every day | ORAL | Status: DC
  Administered 2016-06-04: 180 mg via ORAL
  Filled 2016-06-04: qty 1

## 2016-06-04 MED ORDER — AMIODARONE IV BOLUS ONLY 150 MG/100ML
150.0000 mg | Freq: Once | INTRAVENOUS | Status: AC
Start: 2016-06-04 — End: 2016-06-04
  Administered 2016-06-04: 150 mg via INTRAVENOUS
  Filled 2016-06-04: qty 100

## 2016-06-04 MED ORDER — DILTIAZEM HCL ER COATED BEADS 180 MG PO CP24
300.0000 mg | ORAL_CAPSULE | Freq: Every day | ORAL | Status: DC
  Administered 2016-06-05: 300 mg via ORAL
  Filled 2016-06-04: qty 1

## 2016-06-04 MED ORDER — MENTHOL 3 MG MT LOZG
1.0000 | LOZENGE | OROMUCOSAL | Status: DC | PRN
  Filled 2016-06-04: qty 9

## 2016-06-04 NOTE — Progress Notes (Addendum)
      [address]       [place] [address]             [phone]        7 Days Post-Op Procedure(s) (LRB): TRANSESOPHAGEAL ECHOCARDIOGRAM (TEE) (N/A) ATRIAL SEPTAL DEFECT (ASD) closure (N/A) MEDIAN STERNOTOMY (N/A) MAZE CLIPPING OF ATRIAL APPENDAGE  Subjective: Patient had loose stools most of yesterday  Objective: Vital signs in last 24 hours: Temp:  [98.3 F (36.8 C)-99.3 F (37.4 C)] 98.3 F (36.8 C) (10/19 0339) Pulse Rate:  [89-119] 89 (10/19 0339) Cardiac Rhythm: Atrial fibrillation (10/18 2008) Resp:  [15-24] 20 (10/19 0339) BP: (90-115)/(57-74) 115/65 (10/19 0339) SpO2:  [91 %-98 %] 98 % (10/19 0339) Weight:  [284 lb 4.8 oz (129 kg)] 284 lb 4.8 oz (129 kg) (10/19 0339)  Pre op weight 125 kg Current Weight  06/04/16 284 lb 4.8 oz (129 kg)       Intake/Output from previous day: 10/18 0701 - 10/19 0700 In: 1048.5 [P.O.:600; I.V.:448.5] Out: 1900 [Urine:1900]   Physical Exam:  Cardiovascular: IRRR IRRR Pulmonary: Mostly clear Abdomen: Soft, non tender, bowel sounds present. Extremities: Bilateral lower extremity edema. Wounds: Clean and dry.  No erythema or signs of infection.  Lab Results: CBC: Recent Labs  06/03/16 0033 06/04/16 0312  WBC 11.2* 12.8*  HGB 8.4* 8.1*  HCT 25.6* 24.5*  PLT 336 396   BMET:  Recent Labs  06/03/16 1614 06/04/16 0312  NA 131* 135  K 3.8 4.2  CL 97* 98*  CO2 25 29  GLUCOSE 114* 104*  BUN 9 9  CREATININE 0.74 0.83  CALCIUM 8.3* 8.5*    PT/INR:  Lab Results  Component Value Date   INR 1.28 05/28/2016   INR 0.98 05/26/2016   INR 1.06 10/16/2013   ABG:  INR: Will add last result for INR, ABG once components are confirmed Will add last 4 CBG results once components are confirmed  Assessment/Plan:  1. CV - A fib with HR in mid 100's-120. On Cardizem and Heparin drips. Also on Amiodarone 400 mg tid, Lopressor 100 mg bid. On back up pacer-will disconncect but NOT remove wires yet.  Per Dr. Roxy Manns, will restart Xarelto once EPW removed. Will give Cardizem CD 180 mg and stop Cardizem drip. Also, will give Amiodarone bolus 150 mg IV once this am. 2.  Pulmonary - On room air. Encourage incentive spirometer. 3. Volume Overload - Continue Lasix 40 mg bid. 4.  Acute blood loss anemia - H and H slightly decreased to 8.1 and 24.5. Continue Foltx and Niferex. 5. Stop stool softeners  ZIMMERMAN,DONIELLE MPA-C 06/04/2016,7:37 AM   I have seen and examined the patient and agree with the assessment and plan as outlined.  HR has trended up this evening.  Will restart Cardizem drip and increase dose of Cardizem CD for tomorrow.  Rexene Alberts, MD 06/04/2016 8:27 PM

## 2016-06-04 NOTE — Progress Notes (Signed)
[facility] for Heparin Indication: atrial fibrillation  Allergies  Allergen Reactions  . Avelox [Moxifloxacin Hcl In Nacl] Other (See Comments)    Numbness & tingling Peripheral neuropathy    Patient Measurements: Height: 5\' 11"  (180.3 cm) Weight: 284 lb 4.8 oz (129 kg) IBW/kg (Calculated) : 70.8 Heparin Dosing Weight: 102 kg  Vital Signs: Temp: 98.3 F (36.8 C) (10/19 0339) Temp Source: Oral (10/19 0339) BP: 115/65 (10/19 0339) Pulse Rate: 89 (10/19 0339)  Labs:  Recent Labs  06/02/16 0237 06/02/16 1714  06/03/16 0033 06/03/16 1210 06/03/16 1614 06/03/16 2121 06/04/16 0312  HGB 8.3*  --   --  8.4*  --   --   --  8.1*  HCT 24.7*  --   --  25.6*  --   --   --  24.5*  PLT 266  --   --  336  --   --   --  396  HEPARINUNFRC  --   --   < > 0.16* 0.16*  --  0.24* 0.22*  CREATININE 0.69 0.70  --  0.72  --  0.74  --   --   < > = values in this interval not displayed.  Estimated Creatinine Clearance: 120.8 mL/min (by C-G formula based on SCr of 0.74 mg/dL).  Assessment: 55 year old female with Afib s/p ASD repair and MAZE procedure 10/12, for heparin  Goal of Therapy:  Heparin level 0.3-0.7 units/ml Monitor platelets by anticoagulation protocol: Yes   Plan:  Increase Heparin 2100 units/hr Check heparin level in 8 hours.   Phillis Knack, PharmD, BCPS  06/04/2016,3:56 AM

## 2016-06-04 NOTE — Progress Notes (Addendum)
Destiny Dennis for Heparin Indication: atrial fibrillation  Allergies  Allergen Reactions  . Avelox [Moxifloxacin Hcl In Nacl] Other (See Comments)    Numbness & tingling Peripheral neuropathy    Patient Measurements: Height: 5\' 11"  (180.3 cm) Weight: 284 lb 4.8 oz (129 kg) IBW/kg (Calculated) : 70.8 Heparin Dosing Weight: 102 kg  Vital Signs: Temp: 99.3 F (37.4 C) (10/19 1200) Temp Source: Oral (10/19 1200) BP: 95/67 (10/19 1200) Pulse Rate: 98 (10/19 1200)  Labs:  Recent Labs  06/02/16 0237  06/03/16 0033  06/03/16 1614 06/03/16 2121 06/04/16 0312 06/04/16 1337  HGB 8.3*  --  8.4*  --   --   --  8.1*  --   HCT 24.7*  --  25.6*  --   --   --  24.5*  --   PLT 266  --  336  --   --   --  396  --   HEPARINUNFRC  --   --  0.16*  < >  --  0.24* 0.22* 0.23*  CREATININE 0.69  < > 0.72  --  0.74  --  0.83  --   < > = values in this interval not displayed.  Estimated Creatinine Clearance: 116.4 mL/min (by C-G formula based on SCr of 0.83 mg/dL).  Assessment: 55 year old female with Afib s/p ASD repair and MAZE procedure 10/12, continues on heparin Heparin level still below goal at 0.23 on 2100 units / hr  Goal of Therapy:  Heparin level 0.3-0.7 units/ml Monitor platelets by anticoagulation protocol: Yes   Plan:  Increase Heparin to  2300 units/hr Check heparin level in 8 hours.   To restart Xarelto when pacing wires out, pacer disconnected today  Thank you Anette Guarneri, PharmD [phone] 06/04/2016,2:40 PM

## 2016-06-04 NOTE — Progress Notes (Signed)
CARDIAC REHAB PHASE I   PRE:  Rate/Rhythm: 118-135 afib    BP:     SaO2: 97 RA  MODE:  Ambulation: 550 ft   POST:  Rate/Rhythm: 153 afib    BP: sitting 137/79     SaO2: 97 RA  Pt up at sink, just washed her hair. Sts she feels great today. HR 130s mostly. Able to ambulate independently, no c/o. HR max 153 afib. Sts she does not notice her HR. To recliner. Encouraged more walking today. [id number]  [place], ACSM 06/04/2016 10:17 AM

## 2016-06-05 LAB — CBC
HCT: 25.6 % — ABNORMAL LOW (ref 36.0–46.0)
Hemoglobin: 8.2 g/dL — ABNORMAL LOW (ref 12.0–15.0)
MCH: 27.8 pg (ref 26.0–34.0)
MCHC: 32 g/dL (ref 30.0–36.0)
MCV: 86.8 fL (ref 78.0–100.0)
PLATELETS: 412 10*3/uL — AB (ref 150–400)
RBC: 2.95 MIL/uL — ABNORMAL LOW (ref 3.87–5.11)
RDW: 14.8 % (ref 11.5–15.5)
WBC: 13.3 10*3/uL — ABNORMAL HIGH (ref 4.0–10.5)

## 2016-06-05 LAB — HEPARIN LEVEL (UNFRACTIONATED): HEPARIN UNFRACTIONATED: 0.67 [IU]/mL (ref 0.30–0.70)

## 2016-06-05 MED ORDER — AMIODARONE HCL 200 MG PO TABS
200.0000 mg | ORAL_TABLET | Freq: Two times a day (BID) | ORAL | Status: DC
  Filled 2016-06-05: qty 1

## 2016-06-05 MED ORDER — FLUTICASONE PROPIONATE 50 MCG/ACT NA SUSP
2.0000 | Freq: Every day | NASAL | Status: DC
  Administered 2016-06-05 – 2016-06-09 (×5): 2 via NASAL
  Filled 2016-06-05: qty 16

## 2016-06-05 MED ORDER — METOPROLOL SUCCINATE ER 100 MG PO TB24: 100.0000 mg | ORAL_TABLET | Freq: Two times a day (BID) | ORAL | Status: DC

## 2016-06-05 MED ORDER — RIVAROXABAN 20 MG PO TABS
20.0000 mg | ORAL_TABLET | Freq: Every day | ORAL | Status: DC
  Administered 2016-06-06: 20 mg via ORAL
  Filled 2016-06-05: qty 1

## 2016-06-05 NOTE — Progress Notes (Addendum)
      [address]       [place] [address]             [phone]        8 Days Post-Op Procedure(s) (LRB): TRANSESOPHAGEAL ECHOCARDIOGRAM (TEE) (N/A) ATRIAL SEPTAL DEFECT (ASD) closure (N/A) MEDIAN STERNOTOMY (N/A) MAZE CLIPPING OF ATRIAL APPENDAGE  Subjective: Patient with dry cough-requesting Flonase as has allergies.  Objective: Vital signs in last 24 hours: Temp:  [98 F (36.7 C)-99.3 F (37.4 C)] 98 F (36.7 C) (10/20 0531) Pulse Rate:  [91-125] 91 (10/20 0531) Cardiac Rhythm: Atrial fibrillation;Other (Comment) (10/19 2119) Resp:  [20] 20 (10/20 0531) BP: (95-137)/(67-79) 116/71 (10/20 0531) SpO2:  [94 %-98 %] 94 % (10/20 0531) Weight:  [279 lb 6.4 oz (126.7 kg)] 279 lb 6.4 oz (126.7 kg) (10/20 0531)  Pre op weight 125 kg Current Weight  06/05/16 279 lb 6.4 oz (126.7 kg)      Intake/Output from previous day: 10/19 0701 - 10/20 0700 In: 358 [P.O.:350; I.V.:8] Out: -    Physical Exam:  Cardiovascular: IRRR IRRR Pulmonary: Mostly clear Abdomen: Soft, non tender, bowel sounds present. Extremities: Bilateral lower extremity edema. Wounds: Clean and dry.  No erythema or signs of infection.  Lab Results: CBC:  Recent Labs  06/03/16 0033 06/04/16 0312  WBC 11.2* 12.8*  HGB 8.4* 8.1*  HCT 25.6* 24.5*  PLT 336 396   BMET:   Recent Labs  06/03/16 1614 06/04/16 0312  NA 131* 135  K 3.8 4.2  CL 97* 98*  CO2 25 29  GLUCOSE 114* 104*  BUN 9 9  CREATININE 0.74 0.83  CALCIUM 8.3* 8.5*    PT/INR:  Lab Results  Component Value Date   INR 1.28 05/28/2016   INR 0.98 05/26/2016   INR 1.06 10/16/2013   ABG:  INR: Will add last result for INR, ABG once components are confirmed Will add last 4 CBG results once components are confirmed  Assessment/Plan:  1. CV - A fib with HR in low 100's. On Cardizem and Heparin drips. Also, on Amiodarone 400 mg tid, Lopressor 100 mg bid.  Will give Cardizem CD 300 mg and stop Cardizem  drip later this am. Patient requesting to stop Amiodarone as does not help her a fib and HR decreased after Lopressor so was wondering if TID dosing is better than BID-will discuss with Dr. Roxy Manns. Per Dr. Roxy Manns, will restart Xarelto once EPW removed.  2.  Pulmonary - On room air. Encourage incentive spirometer. 3. Volume Overload - Continue Lasix 40 mg bid. 4.  Acute blood loss anemia - H and H slightly decreased to 8.1 and 24.5. Continue Foltx and Niferex. 5. Continue CRPI  ZIMMERMAN,DONIELLE MPA-C 06/05/2016,7:46 AM    I have seen and examined the patient and agree with the assessment and plan as outlined.  Feels better and HR under much better control.  D/C pacing wires.  Tentatively plan D/C home tomorrow.  Restart Xarelto.  Decrease amiodarone to 200 mg bid  Rexene Alberts, MD 06/05/2016 9:24 PM

## 2016-06-05 NOTE — Progress Notes (Signed)
CARDIAC REHAB PHASE I   Pt just returned from ambulating independently, no complaints. Cardiac surgery discharge education completed with pt and husband at bedside (husband stated he may not be available for education tomorrow). Reviewed IS, sternal precautions, activity progression, exercise, heart healthy diet, daily weights and phase 2 cardiac rehab. Pt verbalized understanding. Pt declines phase 2 cardiac rehab referral, states she plans to exercise independently. Pt in recliner, feet elevated, call bell within reach. Will follow.   [id number] Lenna Sciara, RN, BSN 06/05/2016 10:36 AM

## 2016-06-05 NOTE — Progress Notes (Signed)
Patient states that amiodarone does not work for her and she does not want to take anymore.  She wants to see how well her HR is controlled with metoprolol and diltiazem. Patient ambulating in hallway with husband and HR 100's- 120's.  Patient HR up to 150's briefly when she went to bathroom. Pt resting with call bell within reach.  Will continue to monitor. Payton Emerald, RN

## 2016-06-05 NOTE — Progress Notes (Signed)
[place] for Heparin Indication: atrial fibrillation  Allergies  Allergen Reactions  . Avelox [Moxifloxacin Hcl In Nacl] Other (See Comments)    Numbness & tingling Peripheral neuropathy    Patient Measurements: Height: 5\' 11"  (180.3 cm) Weight: 279 lb 6.4 oz (126.7 kg) IBW/kg (Calculated) : 70.8 Heparin Dosing Weight: 102 kg  Vital Signs: Temp: 98 F (36.7 C) (10/20 0531) Temp Source: Oral (10/20 0531) BP: 116/71 (10/20 0531) Pulse Rate: 91 (10/20 0531)  Labs:  Recent Labs  06/03/16 0033  06/03/16 1614  06/04/16 0312 06/04/16 1337 06/04/16 2254 06/05/16 0842  HGB 8.4*  --   --   --  8.1*  --   --  8.2*  HCT 25.6*  --   --   --  24.5*  --   --  25.6*  PLT 336  --   --   --  396  --   --  412*  HEPARINUNFRC 0.16*  < >  --   < > 0.22* 0.23* 0.25* 0.67  CREATININE 0.72  --  0.74  --  0.83  --   --   --   < > = values in this interval not displayed.  Estimated Creatinine Clearance: 115.3 mL/min (by C-G formula based on SCr of 0.83 mg/dL).  Assessment: 55 year old female with Afib s/p ASD repair and MAZE procedure 10/12  Anticoag: Heparin for afib, on Xarelto PTA, HL = 0.67 on 2600 units/hr  Nephro: Scr 0.83   Heme/Onc: H&H 8.2/25.6, Plt 412  Goal of Therapy:  Heparin level 0.3-0.7 units/ml Monitor platelets by anticoagulation protocol: Yes   Plan:  Continue Heparin at 2600 units / hr Xarelto PTA for afib - stopped one week prior to sx, to resume when pacing wires are out  Levester Fresh, PharmD, BCPS, [facility] Clinical Pharmacist Pager [phone] 06/05/2016 9:42 AM

## 2016-06-05 NOTE — Progress Notes (Signed)
[place] for Heparin Indication: atrial fibrillation  Allergies  Allergen Reactions  . Avelox [Moxifloxacin Hcl In Nacl] Other (See Comments)    Numbness & tingling Peripheral neuropathy    Patient Measurements: Height: 5\' 11"  (180.3 cm) Weight: 284 lb 4.8 oz (129 kg) IBW/kg (Calculated) : 70.8 Heparin Dosing Weight: 102 kg  Vital Signs: Temp: 98.5 F (36.9 C) (10/19 2023) Temp Source: Oral (10/19 2023) BP: 132/79 (10/19 2023) Pulse Rate: 124 (10/19 2023)  Labs:  Recent Labs  06/02/16 0237  06/03/16 0033  06/03/16 1614  06/04/16 0312 06/04/16 1337 06/04/16 2254  HGB 8.3*  --  8.4*  --   --   --  8.1*  --   --   HCT 24.7*  --  25.6*  --   --   --  24.5*  --   --   PLT 266  --  336  --   --   --  396  --   --   HEPARINUNFRC  --   --  0.16*  < >  --   < > 0.22* 0.23* 0.25*  CREATININE 0.69  < > 0.72  --  0.74  --  0.83  --   --   < > = values in this interval not displayed.  Estimated Creatinine Clearance: 116.4 mL/min (by C-G formula based on SCr of 0.83 mg/dL).  Assessment: 55 year old female with Afib s/p ASD repair and MAZE procedure 10/12, continues on heparin. Heparin level remains below goal at 0.25 on 2300 units / hr. No issues with line or bleeding reported per RN.  Goal of Therapy:  Heparin level 0.3-0.7 units/ml Monitor platelets by anticoagulation protocol: Yes   Plan:  Increase Heparin to 2600 units/hr Check heparin level in 8 hours.  F/u restart Xarelto  Sherlon Handing, PharmD, BCPS Clinical pharmacist, pager [phone] 06/05/2016,12:02 AM

## 2016-06-06 ENCOUNTER — Encounter (HOSPITAL_COMMUNITY): Payer: Self-pay | Admitting: Thoracic Surgery (Cardiothoracic Vascular Surgery)

## 2016-06-06 DIAGNOSIS — I495 Sick sinus syndrome: Secondary | ICD-10-CM | POA: Diagnosis not present

## 2016-06-06 LAB — CBC
HCT: 28.1 % — ABNORMAL LOW (ref 36.0–46.0)
HEMOGLOBIN: 8.9 g/dL — AB (ref 12.0–15.0)
MCH: 27.5 pg (ref 26.0–34.0)
MCHC: 31.7 g/dL (ref 30.0–36.0)
MCV: 86.7 fL (ref 78.0–100.0)
PLATELETS: 453 10*3/uL — AB (ref 150–400)
RBC: 3.24 MIL/uL — AB (ref 3.87–5.11)
RDW: 14.8 % (ref 11.5–15.5)
WBC: 18.4 10*3/uL — AB (ref 4.0–10.5)

## 2016-06-06 MED ORDER — METOPROLOL TARTRATE 50 MG PO TABS
50.0000 mg | ORAL_TABLET | Freq: Two times a day (BID) | ORAL | Status: DC
  Filled 2016-06-06: qty 1

## 2016-06-06 MED ORDER — METOPROLOL TARTRATE 50 MG PO TABS: 50.0000 mg | ORAL_TABLET | Freq: Two times a day (BID) | ORAL | Status: DC

## 2016-06-06 MED ORDER — PROMETHAZINE HCL 25 MG/ML IJ SOLN: 12.5000 mg | Freq: Four times a day (QID) | INTRAMUSCULAR | Status: DC | PRN

## 2016-06-06 MED ORDER — ONDANSETRON HCL 4 MG PO TABS
4.0000 mg | ORAL_TABLET | Freq: Four times a day (QID) | ORAL | Status: DC | PRN
  Administered 2016-06-06 – 2016-06-07 (×4): 4 mg via ORAL
  Filled 2016-06-06 (×4): qty 1

## 2016-06-06 MED ORDER — DILTIAZEM HCL 60 MG PO TABS: 60.0000 mg | ORAL_TABLET | Freq: Four times a day (QID) | ORAL | Status: DC | PRN

## 2016-06-06 MED ORDER — PROMETHAZINE HCL 25 MG PO TABS
12.5000 mg | ORAL_TABLET | Freq: Four times a day (QID) | ORAL | Status: DC | PRN
  Administered 2016-06-07: 12.5 mg via ORAL
  Filled 2016-06-06: qty 1

## 2016-06-06 MED ORDER — METOPROLOL TARTRATE 100 MG PO TABS: 100.0000 mg | ORAL_TABLET | Freq: Two times a day (BID) | ORAL | Status: DC

## 2016-06-06 MED ORDER — METOPROLOL TARTRATE 25 MG PO TABS: 25.0000 mg | ORAL_TABLET | Freq: Four times a day (QID) | ORAL | Status: DC | PRN

## 2016-06-06 NOTE — Progress Notes (Addendum)
      [address]       [place] [address]             [phone]      9 Days Post-Op Procedure(s) (LRB): TRANSESOPHAGEAL ECHOCARDIOGRAM (TEE) (N/A) ATRIAL SEPTAL DEFECT (ASD) closure (N/A) MEDIAN STERNOTOMY (N/A) MAZE CLIPPING OF ATRIAL APPENDAGE   Subjective:  Patient feels very poorly this morning.  She complains of severe nausea and just overall doesn't feel well.  Objective: Vital signs in last 24 hours: Temp:  [97.5 F (36.4 C)-98.3 F (36.8 C)] 97.5 F (36.4 C) (10/21 0402) Pulse Rate:  [47-112] 47 (10/21 0932) Cardiac Rhythm: Other (Comment) (10/21 0700) Resp:  [18] 18 (10/21 0402) BP: (91-111)/(49-73) 105/49 (10/21 0402) SpO2:  [94 %-99 %] 94 % (10/21 0402) Weight:  [278 lb 8 oz (126.3 kg)] 278 lb 8 oz (126.3 kg) (10/21 0658)  Intake/Output from previous day: 10/20 0701 - 10/21 0700 In: 1062 [P.O.:720; I.V.:342] Out: -   General appearance: alert, cooperative and no distress Heart: regular rate and rhythm Lungs: clear to auscultation bilaterally Abdomen: soft, non-tender; bowel sounds normal; no masses,  no organomegaly Extremities: edema trace Wound: clean and dry  Lab Results:  Recent Labs  06/05/16 0842 06/06/16 0348  WBC 13.3* 18.4*  HGB 8.2* 8.9*  HCT 25.6* 28.1*  PLT 412* 453*   BMET:  Recent Labs  06/03/16 1614 06/04/16 0312  NA 131* 135  K 3.8 4.2  CL 97* 98*  CO2 25 29  GLUCOSE 114* 104*  BUN 9 9  CREATININE 0.74 0.83  CALCIUM 8.3* 8.5*    PT/INR: No results for input(s): LABPROT, INR in the last 72 hours. ABG    Component Value Date/Time   PHART 7.319 (L) 05/29/2016 0403   HCO3 21.9 05/29/2016 0403   TCO2 26 05/29/2016 1727   ACIDBASEDEF 4.0 (H) 05/29/2016 0403   O2SAT 92.0 05/29/2016 0403   CBG (last 3)  No results for input(s): GLUCAP in the last 72 hours.  Assessment/Plan: S/P Procedure(s) (LRB): TRANSESOPHAGEAL ECHOCARDIOGRAM (TEE) (N/A) ATRIAL SEPTAL DEFECT (ASD) closure (N/A) MEDIAN  STERNOTOMY (N/A) MAZE CLIPPING OF ATRIAL APPENDAGE  1. CV- bradycardia, rate in the 40s- will d/c Amiodarone and Lopressor, left prn for increased HR.Marland Kitchen Unfortunately EPW have been removed 2. Pulm- no acute issues, continue IS 3. Renal- creatinine has been stable, weight is at baseline 4. GI- nausea, unfortunately no IV access currently, will order oral anti-emetics 5. Dispo- patient with nausea, decreased HR... Stop Amiodarone, BB, patient willing to attempt IV placement again, monitor HR   LOS: 9 days    BARRETT, ERIN 06/06/2016  I have seen and examined the patient and agree with the assessment and plan as outlined.  Converted out of AF to slow junctional rhythm.  Will decrease metoprolol and amiodarone, hold cardizem.  Likely will need PPM for tachy-brady syndrome.    Rexene Alberts, MD 06/06/2016

## 2016-06-06 NOTE — Progress Notes (Signed)
Patient has refused to ambulate due to being nauseous. She is taking Zofran with some relief. Will continue to monitor.

## 2016-06-06 NOTE — Progress Notes (Signed)
Pt rhythm changed from atrial fibrillation (110s) to junctional rhythm (low 50s).  Pt reported that she did not feel good and felt nauseous. Pt given Zofran IV.  Pt reports that her nausea became better but requested to have something else for nausea.  MD notified, no new orders at this time, advised to educate pt that symptoms are related to rate and rhythm change. Pt education completed, pt agreeable.  Pt now repositioned in chair and resting.  RN will continue to monitor pt, pt call bell within reach, pt advised to call RN/staff if symptoms do not improve.      Izola Price, RN 06/06/2016

## 2016-06-07 DIAGNOSIS — I495 Sick sinus syndrome: Secondary | ICD-10-CM

## 2016-06-07 LAB — CBC
HEMATOCRIT: 24.6 % — AB (ref 36.0–46.0)
HEMOGLOBIN: 7.9 g/dL — AB (ref 12.0–15.0)
MCH: 27.2 pg (ref 26.0–34.0)
MCHC: 32.1 g/dL (ref 30.0–36.0)
MCV: 84.8 fL (ref 78.0–100.0)
Platelets: 328 10*3/uL (ref 150–400)
RBC: 2.9 MIL/uL — ABNORMAL LOW (ref 3.87–5.11)
RDW: 14.8 % (ref 11.5–15.5)
WBC: 14.9 10*3/uL — ABNORMAL HIGH (ref 4.0–10.5)

## 2016-06-07 MED ORDER — METOPROLOL TARTRATE 100 MG PO TABS
100.0000 mg | ORAL_TABLET | Freq: Once | ORAL | Status: AC
  Administered 2016-06-07: 100 mg via ORAL
  Filled 2016-06-07: qty 1

## 2016-06-07 MED ORDER — METOPROLOL TARTRATE 50 MG PO TABS
50.0000 mg | ORAL_TABLET | Freq: Two times a day (BID) | ORAL | Status: DC
  Administered 2016-06-08 – 2016-06-09 (×2): 50 mg via ORAL
  Filled 2016-06-07 (×2): qty 1

## 2016-06-07 MED ORDER — METOPROLOL SUCCINATE ER 100 MG PO TB24: 100.0000 mg | ORAL_TABLET | Freq: Two times a day (BID) | ORAL | Status: DC

## 2016-06-07 MED ORDER — AMIODARONE HCL 200 MG PO TABS
200.0000 mg | ORAL_TABLET | Freq: Two times a day (BID) | ORAL | Status: DC
  Administered 2016-06-07: 200 mg via ORAL
  Filled 2016-06-07: qty 1

## 2016-06-07 MED ORDER — AMIODARONE HCL 200 MG PO TABS
400.0000 mg | ORAL_TABLET | Freq: Once | ORAL | Status: AC
  Administered 2016-06-07: 400 mg via ORAL
  Filled 2016-06-07: qty 2

## 2016-06-07 MED ORDER — METOPROLOL TARTRATE 100 MG PO TABS
100.0000 mg | ORAL_TABLET | Freq: Two times a day (BID) | ORAL | Status: DC
  Administered 2016-06-07: 100 mg via ORAL
  Filled 2016-06-07: qty 1

## 2016-06-07 MED ORDER — AMIODARONE HCL 200 MG PO TABS
200.0000 mg | ORAL_TABLET | Freq: Two times a day (BID) | ORAL | Status: DC
  Administered 2016-06-07 – 2016-06-09 (×3): 200 mg via ORAL
  Filled 2016-06-07 (×3): qty 1

## 2016-06-07 NOTE — Progress Notes (Signed)
Reason for Consult:symptomatic tachy-brady after MAZE surgery  Referring Physician: Dr. Larey Days is an 55 y.o. female.   HPI: The patient is a 55 yo woman with a h/o atrial fibrillation dating back several years. She is morbidly obese. She also has a h/o ASD. The patient has been treated with multiple medications including flecainide and tikosyn. She initially did well on Tikosyn but had increasing break throughs and was referred and underwent successful MAZE several days ago. Postop, she has had severe sinus node dysfunction with junctional rhythm and atrial fib with a RVR. She has begun to develop post termination pauses of over 5 seconds while awake and these are symptomatic. The patient has not had frank syncope. Her atrial fib post op is at rates of up to 120 a minute. She denies anginal symptoms.  PMH: Past Medical History:  Diagnosis Date  . Allergic rhinitis   . Anxiety    when tachycardia occurs  . Arthritis    OA, s/p numerous surgeries -back, knees, hip  . Atrial flutter ([place])   . Atrial septal defect 01/01/2016   Discovered on TEE   . Benign fundic gland polyps of stomach   . Cervical disc disease   . Complication of anesthesia   . Diverticulitis    CT Scan   . DVT (deep venous thrombosis) ([place])    in pregnancy, s/p hip surgery  . Eustachian tube dysfunction   . GERD 12/23/2009   diet related-not a problem  . Headache(784.0)    migraines occ.  Marland Kitchen Hemorrhoids   . Hiatal hernia    small  . History of MRSA infection 2008   superficial skin-cleared easily with doxycycline  . Hypertension   . Obesity   . Peripheral vascular disease ([place])    left leg after hip surgery  . Persistent atrial fibrillation ([place]) 03/05/2007   a. s/p RFCA 12/18/2011, 07/29/12  . PONV (postoperative nausea and vomiting)   . s/p atrial septal defect repair 05/28/2016  . S/P Maze operation for atrial fibrillation 05/28/2016   Complete bilateral atrial lesion set using cryothermy and  bipolar radiofrequency ablation with clipping of LA appendage via median sternotomy  . Sialoadenitis   . SVT (supraventricular tachycardia) ([place]) 03/18/2010   PVCs  . Tachy-brady syndrome ([place])     PSHX: Past Surgical History:  Procedure Laterality Date  . ASD REPAIR N/A 05/28/2016   Procedure: ATRIAL SEPTAL DEFECT (ASD) closure;  Surgeon: Rexene Alberts, MD;  Location: [facility];  Service: Open Heart Surgery;  Laterality: N/A;  . ATRIAL FIBRILLATION ABLATION  07/29/2012   PVI by Dr Rayann Heman (second procedure)  . ATRIAL FIBRILLATION ABLATION N/A 12/18/2011   Procedure: ATRIAL FIBRILLATION ABLATION;  Surgeon: [name], MD;  Location: [facility] CATH LAB;  Service: Cardiovascular;  Laterality: N/A;  . ATRIAL FIBRILLATION ABLATION N/A 07/29/2012   Procedure: ATRIAL FIBRILLATION ABLATION;  Surgeon: [name], MD;  Location: [facility] CATH LAB;  Service: Cardiovascular;  Laterality: N/A;  . atrial fibrillation and atrial flutter ablation  12/17/11, 07/29/12   PVI and CTI ablations by Dr Rayann Heman  . CARDIAC CATHETERIZATION N/A 04/10/2016   Procedure: Right/Left Heart Cath and Coronary Angiography;  Surgeon: Jettie Booze, MD;  Location: [facility] CV LAB;  Service: Cardiovascular;  Laterality: N/A;  . CERVICAL FUSION    . CESAREAN SECTION     x 2  . CHOLECYSTECTOMY    . CLIPPING OF ATRIAL APPENDAGE  05/28/2016   Procedure: CLIPPING OF ATRIAL APPENDAGE;  Surgeon: Rexene Alberts, MD;  Location: [facility];  Service: Open Heart Surgery;;  . COLONOSCOPY  07/16/2011   diverticulosis  . ELBOW SURGERY     Right  . EP IMPLANTABLE DEVICE N/A 09/10/2015   Procedure: Loop Recorder Insertion;  Surgeon: [name], MD;  Location: [facility] CV LAB;  Service: Cardiovascular;  Laterality: N/A;  . FLEXIBLE SIGMOIDOSCOPY     1990's  . FOOT SURGERY     Right   . I&D KNEE WITH POLY EXCHANGE Left 12/12/2012   Procedure: LEFT KNEE EXCISION SAPHENOUS NEUROMA/OPEN SCAR DEBRIDEMENT/POLY EXCHANGE/NERVE EXCISION;  Surgeon:  Mauri Pole, MD;  Location: [facility];  Service: Orthopedics;  Laterality: Left;  . JOINT REPLACEMENT Bilateral    4'14  knees  . KNEE SURGERY     x 9 Left Knee  . MAZE  05/28/2016   Procedure: MAZE;  Surgeon: Rexene Alberts, MD;  Location: [facility];  Service: Open Heart Surgery;;  . MEDIASTERNOTOMY N/A 05/28/2016   Procedure: MEDIAN STERNOTOMY;  Surgeon: Rexene Alberts, MD;  Location: [facility];  Service: Open Heart Surgery;  Laterality: N/A;  . SHOULDER SURGERY     Right   . TEE WITHOUT CARDIOVERSION  12/17/2011   Procedure: TRANSESOPHAGEAL ECHOCARDIOGRAM (TEE);  Surgeon: Larey Dresser, MD;  Location: [facility] ENDOSCOPY;  Service: Cardiovascular;  Laterality: N/A;  . TEE WITHOUT CARDIOVERSION  07/28/2012   Procedure: TRANSESOPHAGEAL ECHOCARDIOGRAM (TEE);  Surgeon: Thayer Headings, MD;  Location: [facility];  Service: Cardiovascular;  Laterality: N/A;  . TEE WITHOUT CARDIOVERSION N/A 01/01/2016   Procedure: TRANSESOPHAGEAL ECHOCARDIOGRAM (TEE);  Surgeon: Sanda Klein, MD;  Location: [facility] ENDOSCOPY;  Service: Cardiovascular;  Laterality: N/A;  . TEE WITHOUT CARDIOVERSION N/A 05/28/2016   Procedure: TRANSESOPHAGEAL ECHOCARDIOGRAM (TEE);  Surgeon: Rexene Alberts, MD;  Location: [facility];  Service: Open Heart Surgery;  Laterality: N/A;  . TOTAL HIP ARTHROPLASTY Left 10/17/2013   Procedure: LEFT TOTAL HIP ARTHROPLASTY ANTERIOR APPROACH;  Surgeon: Mauri Pole, MD;  Location: [facility];  Service: Orthopedics;  Laterality: Left;  . TOTAL KNEE ARTHROPLASTY Right 12/12/2012   Procedure: RIGHT TOTAL KNEE ARTHROPLASTY;  Surgeon: Mauri Pole, MD;  Location: [facility];  Service: Orthopedics;  Laterality: Right;  . UPPER GASTROINTESTINAL ENDOSCOPY  01/30/2010   hiatal hernia, fundic gland polyps  . WRIST SURGERY     Left     FAMHX: Family History  Problem Relation Age of Onset  . Diabetes Maternal Grandfather   . Diverticulitis Brother     x 2  . Diverticulitis Paternal Grandmother   . Colon cancer Neg Hx      Social History:  reports that she has never smoked. She has never used smokeless tobacco. She reports that she drinks alcohol. She reports that she does not use drugs.  Allergies:  Allergies  Allergen Reactions  . Avelox [Moxifloxacin Hcl In Nacl] Other (See Comments)    Numbness & tingling Peripheral neuropathy    Medications: I have reviewed the patient's current medications.  No results found.  ROS  As stated in the HPI and negative for all other systems.  Physical Exam  Vitals:Blood pressure 108/60, pulse 97, temperature 98.2 F (36.8 C), temperature source Oral, resp. rate 17, height 5\' 11"  (1.803 m), weight 276 lb 11.2 oz (125.5 kg), last menstrual period 04/17/2012, SpO2 95 %.  Well appearing obese middle aged woman, NAD HEENT: Unremarkable Neck:  No JVD, no thyromegally Lymphatics:  No adenopathy Back:  No CVA tenderness Lungs:  Clear with no wheezes HEART:  IRegular rate rhythm, no murmurs, no rubs, no clicks Abd:  Flat, positive bowel sounds, no organomegally, no rebound, no guarding Ext:  2 plus pulses, no edema, no cyanosis, no clubbing Skin:  No rashes no nodules Neuro:  CN II through XII intact, motor grossly intact  TELE - atrial fib with a RVR, along with pauses of over 5 seconds.  Assessment/Plan: 1. Symptomatic tachy-brady - the patient has had recurrent atrial fib and pauses. She is symptomatic. I have discussed the treatment options. Despite her young age, I suspect PPM insertion and a short bit of amiodarone, with hopeful weaning of amio over 3-6 months would be her best chance for long term NSR.  2. Sinus node dysfunction - some of this is related to her surgical MAZE. It may recover eventually. 3. Obesity - ongoing control of her weight will be important for maintanance of NSR. 4. HTN - her blood pressure is controlled. Will follow.  Suella Grove 06/07/2016, 11:09 AM    Patient ID: Destiny Dennis, female   DOB: 03-21-1961, 55 y.o.   MRN:  [id number]

## 2016-06-07 NOTE — Progress Notes (Signed)
[facility] BRIEF SICU PROGRESS NOTE  10 Days Post-Op  S/P Procedure(s) (LRB): TRANSESOPHAGEAL ECHOCARDIOGRAM (TEE) (N/A) ATRIAL SEPTAL DEFECT (ASD) closure (N/A) MEDIAN STERNOTOMY (N/A) MAZE CLIPPING OF ATRIAL APPENDAGE   Amiodarone and metoprolol were held last night.  Patient back into rapid Afib early this morning.  Still in rapid AF although HR decreased some after 1 dose of metoprolol and amiodarone  Plan: I think patient needs PPM placement.  Will discuss with EPS team.  Rexene Alberts, MD 06/07/2016 6:48 AM

## 2016-06-07 NOTE — Progress Notes (Signed)
   06/07/16 0400  Vitals  Temp 98.2 F (36.8 C)  Temp Source Oral  BP 114/73  BP Location Left Arm  BP Method Automatic  Patient Position (if appropriate) Lying  Pulse Rate (!) 121  Pulse Rate Source Dinamap  ECG Heart Rate (!) 130  Cardiac Rhythm Atrial fibrillation  Resp 17  Oxygen Therapy  SpO2 95 %  O2 Device Room Air    Pt. Converted to Afib this morning. Pt is asymptomatic. Txtpaged Dr. Roxy Manns. Received verbal order for one time dose of PO amiodarone 400 mg and lopressor 100 mg. Pt expressed some frustrations. Given emotional support. Will continue to monitor closely.

## 2016-06-07 NOTE — Progress Notes (Addendum)
      [address]       [place] [address]             [phone]      10 Days Post-Op Procedure(s) (LRB): TRANSESOPHAGEAL ECHOCARDIOGRAM (TEE) (N/A) ATRIAL SEPTAL DEFECT (ASD) closure (N/A) MEDIAN STERNOTOMY (N/A) MAZE CLIPPING OF ATRIAL APPENDAGE   Subjective:  Destiny Dennis is feeling a little better. She states she was feeling good until she went back into A. Fib.  She states Dr. Lovena Le came by and said PPM could be done tomorrow.  Objective: Vital signs in last 24 hours: Temp:  [98.2 F (36.8 C)-98.4 F (36.9 C)] 98.2 F (36.8 C) (10/22 0400) Pulse Rate:  [57-121] 121 (10/22 0400) Cardiac Rhythm: Atrial fibrillation (10/22 0707) Resp:  [17-20] 17 (10/22 0400) BP: (114-139)/(52-73) 114/73 (10/22 0400) SpO2:  [95 %-98 %] 95 % (10/22 0400) Weight:  [276 lb 11.2 oz (125.5 kg)] 276 lb 11.2 oz (125.5 kg) (10/22 0455)  Intake/Output from previous day: 10/21 0701 - 10/22 0700 In: 530 [P.O.:530] Out: -   General appearance: alert, cooperative and no distress Heart: irregularly irregular rhythm Lungs: clear to auscultation bilaterally Abdomen: soft, non-tender; bowel sounds normal; no masses,  no organomegaly Extremities: edema trace Wound: clean and dry  Lab Results:  Recent Labs  06/06/16 0348 06/07/16 0354  WBC 18.4* 14.9*  HGB 8.9* 7.9*  HCT 28.1* 24.6*  PLT 453* 328   BMET: No results for input(s): NA, K, CL, CO2, GLUCOSE, BUN, CREATININE, CALCIUM in the last 72 hours.  PT/INR: No results for input(s): LABPROT, INR in the last 72 hours. ABG    Component Value Date/Time   PHART 7.319 (L) 05/29/2016 0403   HCO3 21.9 05/29/2016 0403   TCO2 26 05/29/2016 1727   ACIDBASEDEF 4.0 (H) 05/29/2016 0403   O2SAT 92.0 05/29/2016 0403   CBG (last 3)  No results for input(s): GLUCAP in the last 72 hours.  Assessment/Plan: S/P Procedure(s) (LRB): TRANSESOPHAGEAL ECHOCARDIOGRAM (TEE) (N/A) ATRIAL SEPTAL DEFECT (ASD) closure (N/A) MEDIAN  STERNOTOMY (N/A) MAZE CLIPPING OF ATRIAL APPENDAGE  1. CV- A. Fib, - Amiodarone, Lopressor restarted.  HR improved- EPS consult placed, and per patient recs in PPM for Tachy-Brady Syndrome 2. Pulm- no acute issues, continue IS 3. Renal- creatinine has been stable, weight at baseline 4. GI- nausea improved 5. Dispo- patient in rate controlled A. Fib, continue Amiodarone, Lopressor as HR allows, EP consult obtained, per patient likely PPM tomorrow   LOS: 10 days    BARRETT, ERIN 06/07/2016   I have seen and examined the patient and agree with the assessment and plan as outlined.  For PPM placement tomorrow.  Anticipate possible d/c home on Tuesday.  Rexene Alberts, MD 06/07/2016 12:21 PM

## 2016-06-07 NOTE — Progress Notes (Signed)
At approximately 3:09pm, the husband came from the room to notify that the patient was having "convulsions" and that her heart rate had gone to zero. The charge nurse went with husband and the patient had stopped at that point. CCMD notified the RN that the patient had converted to sinus brady. Upon reviewing the chart the patient was in AFIB at a rate of 103, had a three second reading of asystole, and then converted to sinus brady. The patient is now in bed and complains of nausea. She has been given Zofran. Blood pressure is 97/59. The physician has been notified of the occurrence. He stated to hold metoprolol for a heart rate of less than 60. Will continue to monitor.

## 2016-06-08 ENCOUNTER — Encounter (HOSPITAL_COMMUNITY)
Admission: RE | Disposition: A | Discharge status: Home / Self Care | Payer: Self-pay | Source: Ambulatory Visit | Attending: Thoracic Surgery (Cardiothoracic Vascular Surgery)

## 2016-06-08 ENCOUNTER — Ambulatory Visit (INDEPENDENT_AMBULATORY_CARE_PROVIDER_SITE_OTHER): Payer: Managed Care, Other (non HMO) | Admitting: *Deleted

## 2016-06-08 DIAGNOSIS — Z8679 Personal history of other diseases of the circulatory system: Secondary | ICD-10-CM

## 2016-06-08 DIAGNOSIS — I5022 Chronic systolic (congestive) heart failure: Secondary | ICD-10-CM

## 2016-06-08 DIAGNOSIS — I480 Paroxysmal atrial fibrillation: Secondary | ICD-10-CM | POA: Diagnosis not present

## 2016-06-08 DIAGNOSIS — I4891 Unspecified atrial fibrillation: Secondary | ICD-10-CM

## 2016-06-08 DIAGNOSIS — I481 Persistent atrial fibrillation: Principal | ICD-10-CM

## 2016-06-08 DIAGNOSIS — Z950 Presence of cardiac pacemaker: Secondary | ICD-10-CM

## 2016-06-08 DIAGNOSIS — Z9889 Other specified postprocedural states: Secondary | ICD-10-CM

## 2016-06-08 HISTORY — PX: EP IMPLANTABLE DEVICE: SHX172B

## 2016-06-08 HISTORY — DX: Presence of cardiac pacemaker: Z95.0

## 2016-06-08 SURGERY — PACEMAKER IMPLANT
Anesthesia: LOCAL

## 2016-06-08 MED ORDER — LIDOCAINE HCL (PF) 1 % IJ SOLN
INTRAMUSCULAR | Status: AC
  Filled 2016-06-08: qty 30

## 2016-06-08 MED ORDER — FENTANYL CITRATE (PF) 100 MCG/2ML IJ SOLN
INTRAMUSCULAR | Status: AC
  Filled 2016-06-08: qty 2

## 2016-06-08 MED ORDER — SODIUM CHLORIDE 0.9 % IV SOLN
INTRAVENOUS | Status: DC
  Administered 2016-06-08: 50 mL/h via INTRAVENOUS

## 2016-06-08 MED ORDER — HEPARIN (PORCINE) IN NACL 2-0.9 UNIT/ML-% IJ SOLN
INTRAMUSCULAR | Status: DC | PRN
Start: 2016-06-08 — End: 2016-06-08
  Administered 2016-06-08: 17:00:00

## 2016-06-08 MED ORDER — METOPROLOL TARTRATE 5 MG/5ML IV SOLN
INTRAVENOUS | Status: DC | PRN
  Administered 2016-06-08: 5 mg via INTRAVENOUS

## 2016-06-08 MED ORDER — MIDAZOLAM HCL 5 MG/5ML IJ SOLN
INTRAMUSCULAR | Status: AC
  Filled 2016-06-08: qty 5

## 2016-06-08 MED ORDER — MIDAZOLAM HCL 5 MG/5ML IJ SOLN
INTRAMUSCULAR | Status: DC | PRN
  Administered 2016-06-08 (×4): 1 mg via INTRAVENOUS
  Administered 2016-06-08: 2 mg via INTRAVENOUS
  Administered 2016-06-08: 1 mg via INTRAVENOUS

## 2016-06-08 MED ORDER — DEXTROSE 5 % IV SOLN
3.0000 g | INTRAVENOUS | Status: AC
  Administered 2016-06-08: 3 g via INTRAVENOUS
  Filled 2016-06-08: qty 3000

## 2016-06-08 MED ORDER — SODIUM CHLORIDE 0.9 % IR SOLN
Status: AC
  Filled 2016-06-08: qty 2

## 2016-06-08 MED ORDER — ONDANSETRON HCL 4 MG/2ML IJ SOLN: 4.0000 mg | Freq: Four times a day (QID) | INTRAMUSCULAR | Status: DC | PRN

## 2016-06-08 MED ORDER — LIDOCAINE HCL (PF) 1 % IJ SOLN
INTRAMUSCULAR | Status: AC
  Filled 2016-06-08: qty 60

## 2016-06-08 MED ORDER — FENTANYL CITRATE (PF) 100 MCG/2ML IJ SOLN
INTRAMUSCULAR | Status: DC | PRN
  Administered 2016-06-08 (×5): 25 ug via INTRAVENOUS

## 2016-06-08 MED ORDER — CHLORHEXIDINE GLUCONATE 4 % EX LIQD
60.0000 mL | Freq: Once | CUTANEOUS | Status: AC
  Administered 2016-06-08: 4 via TOPICAL

## 2016-06-08 MED ORDER — METOPROLOL TARTRATE 5 MG/5ML IV SOLN
INTRAVENOUS | Status: AC
  Filled 2016-06-08: qty 5

## 2016-06-08 MED ORDER — LIDOCAINE HCL (PF) 1 % IJ SOLN
INTRAMUSCULAR | Status: DC | PRN
  Administered 2016-06-08: 12 mL via INTRADERMAL
  Administered 2016-06-08: 50 mL via INTRADERMAL

## 2016-06-08 MED ORDER — CHLORHEXIDINE GLUCONATE 4 % EX LIQD
CUTANEOUS | Status: AC
  Administered 2016-06-08: 1
  Filled 2016-06-08: qty 15

## 2016-06-08 MED ORDER — SODIUM CHLORIDE 0.9 % IR SOLN
80.0000 mg | Status: AC
  Administered 2016-06-08: 80 mg
  Filled 2016-06-08: qty 2

## 2016-06-08 MED ORDER — IOPAMIDOL (ISOVUE-370) INJECTION 76%
INTRAVENOUS | Status: AC
  Filled 2016-06-08: qty 50

## 2016-06-08 MED ORDER — IOHEXOL 350 MG/ML SOLN: INTRAVENOUS | Status: DC | PRN

## 2016-06-08 MED ORDER — CEFAZOLIN IN D5W 1 GM/50ML IV SOLN
1.0000 g | Freq: Four times a day (QID) | INTRAVENOUS | Status: AC
Start: 2016-06-08 — End: 2016-06-09
  Administered 2016-06-08 – 2016-06-09 (×3): 1 g via INTRAVENOUS
  Filled 2016-06-08 (×3): qty 50

## 2016-06-08 MED ORDER — HEPARIN (PORCINE) IN NACL 2-0.9 UNIT/ML-% IJ SOLN
INTRAMUSCULAR | Status: AC
  Filled 2016-06-08: qty 500

## 2016-06-08 MED ORDER — ACETAMINOPHEN 325 MG PO TABS: 325.0000 mg | ORAL_TABLET | ORAL | Status: DC | PRN

## 2016-06-08 MED ORDER — SODIUM CHLORIDE 0.9 % IV SOLN
INTRAVENOUS | Status: DC
  Administered 2016-06-08: 07:00:00 via INTRAVENOUS

## 2016-06-08 MED ORDER — IOPAMIDOL (ISOVUE-370) INJECTION 76%
INTRAVENOUS | Status: DC | PRN
  Administered 2016-06-08: 3 mL via INTRAVENOUS
  Administered 2016-06-08: 25 mL via INTRAVENOUS

## 2016-06-08 SURGICAL SUPPLY — 20 items
BALLN ATTAIN 80 (BALLOONS) ×3
BALLOON ATTAIN 80 (BALLOONS) ×1 IMPLANT
CABLE SURGICAL S-101-97-12 (CABLE) ×1 IMPLANT
CATH ATTAIN COM SURV 6250V-MB2 (CATHETERS) ×1 IMPLANT
CATH ATTAIN SELECT 6238TEL (CATHETERS) ×1 IMPLANT
DEVICE CRTP PERCEPTA QUAD MRI (Pacemaker) ×1 IMPLANT
GUIDEWIRE ANGLED .035X150CM (WIRE) ×1 IMPLANT
KIT ESSENTIALS PG (KITS) ×1 IMPLANT
LEAD ATTAIN PERFOMA 4298-88CM (Lead) ×2 IMPLANT
LEAD CAPSURE NOVUS 5076-52CM (Lead) ×1 IMPLANT
LEAD CAPSURE NOVUS 5076-58CM (Lead) ×1 IMPLANT
PACK LOOP INSERTION (CUSTOM PROCEDURE TRAY) ×3 IMPLANT
PAD DEFIB LIFELINK (PAD) ×1 IMPLANT
SHEATH CLASSIC 7F (SHEATH) ×2 IMPLANT
SHEATH CLASSIC 9.5F (SHEATH) ×1 IMPLANT
SHIELD RADPAD SCOOP 12X17 (MISCELLANEOUS) ×2 IMPLANT
SLITTER 6230UNI (MISCELLANEOUS) ×2 IMPLANT
TRAY PACEMAKER INSERTION (PACKS) ×1 IMPLANT
WIRE ACUITY WHISPER EDS 4648 (WIRE) ×2 IMPLANT
WIRE HI TORQ VERSACORE-J 145CM (WIRE) ×2 IMPLANT

## 2016-06-08 NOTE — H&P (Signed)
Destiny Dennis is a 55 y.o. female s/p MAZE with tachy brady and pauses.  She presents to the EP lab for CRT-P placement.  Risks and benefits explained.  Risks include but not limited to bleeding, infection, tamponade, pneumothorax.  The patient understands the risks and has agreed tot he procedure.

## 2016-06-08 NOTE — Progress Notes (Addendum)
      [address]       [place] [address]             [phone]        11 Days Post-Op Procedure(s) (LRB): TRANSESOPHAGEAL ECHOCARDIOGRAM (TEE) (N/A) ATRIAL SEPTAL DEFECT (ASD) closure (N/A) MEDIAN STERNOTOMY (N/A) MAZE CLIPPING OF ATRIAL APPENDAGE  Subjective: Patient states had a bad day yesterday-"had convulsion" after brief asystole  Objective: Vital signs in last 24 hours: Temp:  [98.3 F (36.8 C)-98.4 F (36.9 C)] 98.4 F (36.9 C) (10/23 0544) Pulse Rate:  [46-97] 52 (10/23 0544) Cardiac Rhythm: Junctional rhythm (10/22 1900) Resp:  [20] 20 (10/23 0544) BP: (97-130)/(44-60) 130/54 (10/23 0544) SpO2:  [90 %-97 %] 90 % (10/23 0544) Weight:  [278 lb 7.1 oz (126.3 kg)] 278 lb 7.1 oz (126.3 kg) (10/23 0544)  Pre op weight 125 kg Current Weight  06/08/16 278 lb 7.1 oz (126.3 kg)      Intake/Output from previous day: 10/22 0701 - 10/23 0700 In: 600 [P.O.:600] Out: -    Physical Exam:  Cardiovascular: RRR Pulmonary: Mostly clear Abdomen: Soft, non tender, bowel sounds present. Extremities: Trace lower extremity edema. Wounds: Clean and dry.  No erythema or signs of infection.  Lab Results: CBC:  Recent Labs  06/06/16 0348 06/07/16 0354  WBC 18.4* 14.9*  HGB 8.9* 7.9*  HCT 28.1* 24.6*  PLT 453* 328   BMET:  No results for input(s): NA, K, CL, CO2, GLUCOSE, BUN, CREATININE, CALCIUM in the last 72 hours.  PT/INR:  Lab Results  Component Value Date   INR 1.28 05/28/2016   INR 0.98 05/26/2016   INR 1.06 10/16/2013   ABG:  INR: Will add last result for INR, ABG once components are confirmed Will add last 4 CBG results once components are confirmed  Assessment/Plan:  1. CV - Previous A fib with HR in low 100's. Now with tachy brady syndrome.  On Amiodarone 200 mg tid, Lopressor 50 mg bid.  For PPM today. 2.  Pulmonary - On room air. Encourage incentive spirometer. 3. Volume Overload - On Lasix 40 mg bid. 4.  Acute blood  loss anemia - H and H decreased to 7.9 and 24.6. Continue Foltx and Niferex. 5. Continue CRPI 6. Likely discharge in am  ZIMMERMAN,DONIELLE MPA-C 06/08/2016,7:20 AM   I have seen and examined the patient and agree with the assessment and plan as outlined.  Awaiting PPM placement.  Restart pre-op home dose Toprol XL after pacer in place and continue amiodarone 200 bid.  Rexene Alberts, MD 06/08/2016 8:58 AM

## 2016-06-08 NOTE — Progress Notes (Signed)
Carelink Summary Report / Loop Recorder 

## 2016-06-08 NOTE — Progress Notes (Signed)
Orthopedic Tech Progress Note Patient Details:  Destiny Dennis [birth year] [id number]  Patient already has sling. Patient ID: Destiny Dennis, female   DOB: [birth year], 55 y.o.   MRN: [id number]   Charlott Rakes 06/08/2016, 8:25 PM

## 2016-06-08 NOTE — Progress Notes (Signed)
SUBJECTIVE: The patient is doing stable today.  She is scared of having another pause.  No chest pain or shortness of breath. Still with incisional soreness.   CURRENT MEDICATIONS: . amiodarone  200 mg Oral BID  .  ceFAZolin (ANCEF) IV  3 g Intravenous To Cath  . fluticasone  2 spray Each Nare Daily  . folic acid-pyridoxine-cyancobalamin  1 tablet Oral Daily  . furosemide  40 mg Oral BID  . gentamicin irrigation  80 mg Irrigation To Cath  . iron polysaccharides  150 mg Oral Daily  . metoprolol tartrate  50 mg Oral BID  . pantoprazole  40 mg Oral Daily  . potassium chloride  40 mEq Oral TID  . sodium chloride flush  10 mL Intravenous Q12H  . sodium chloride flush  3 mL Intravenous Q12H   . sodium chloride    . sodium chloride 50 mL/hr (06/08/16 0700)  . sodium chloride 50 mL/hr at 06/08/16 0705    OBJECTIVE: Physical Exam: Vitals:   06/07/16 1026 06/07/16 1526 06/07/16 1945 06/08/16 0544  BP: 108/60 (!) 97/59 (!) 112/44 (!) 130/54  Pulse: 97 (!) 46 (!) 51 (!) 52  Resp:   20 20  Temp:   98.3 F (36.8 C) 98.4 F (36.9 C)  TempSrc:   Axillary Oral  SpO2:   97% 90%  Weight:    278 lb 7.1 oz (126.3 kg)  Height:        Intake/Output Summary (Last 24 hours) at 06/08/16 0856 Last data filed at 06/08/16 0500  Gross per 24 hour  Intake              360 ml  Output                0 ml  Net              360 ml    Telemetry reveals sinus rhythm with prolonged 1st degree AV block vs junctional rhythm  GEN- The patient is well appearing, alert and oriented x 3 today.   Head- normocephalic, atraumatic Eyes-  Sclera clear, conjunctiva pink Ears- hearing intact Oropharynx- clear Neck- supple  Lungs- Clear to ausculation bilaterally, normal work of breathing Heart- Regular rate and rhythm  GI- soft, NT, ND, + BS Extremities- no clubbing, cyanosis, or edema Skin- no rash or lesion Psych- euthymic mood, full affect Neuro- strength and sensation are  intact  LABS: CBC:  Recent Labs  06/06/16 0348 06/07/16 0354  WBC 18.4* 14.9*  HGB 8.9* 7.9*  HCT 28.1* 24.6*  MCV 86.7 84.8  PLT 453* 328    RADIOLOGY: Dg Chest Port 1 View Result Date: 06/02/2016 CLINICAL DATA:  Atelectasis EXAM: PORTABLE CHEST 1 VIEW COMPARISON:  06/01/2016 FINDINGS: Cardiomediastinal silhouette is stable. Central mild vascular congestion without convincing pulmonary edema. Mild basilar atelectasis. Atrial appendage clip and status post median sternotomy again noted. IMPRESSION: Stable postsurgical changes. Mild basilar atelectasis. No convincing pulmonary edema. Electronically Signed   By: Lahoma Crocker M.D.   On: 06/02/2016 10:16   ASSESSMENT AND PLAN:  Principal Problem:   S/P Maze operation for atrial fibrillation + closure ASD Active Problems:   Atrial fibrillation (HCC)   SVT (supraventricular tachycardia) (HCC)   Obesity   Anxiety   Persistent atrial fibrillation (HCC)   Atrial septal defect   Cardiomyopathy ([place])   s/p atrial septal defect repair   Tachy-brady syndrome ([place])  1.  Symptomatic post termination pauses of up to 8 seconds Risks,  benefits to pacemaker implantation reviewed with the patient by Dr. Rayann Heman who wishes to proceed. Will do as schedule allows  Will implant MDT device with atrial therapies As she has some LV dysfunction with prolonged 1st degree AV block and may require ventricular pacing, placement of LV lead at time of pacemaker implant is reasonable.   2.  Persistent atrial fibrillation Now s/p MAZE Continue amiodarone for now   Chanetta Marshall, NP 06/08/2016 9:07 AM   I have seen, examined the patient, and reviewed the above assessment and plan.  On exam. RRR. Changes to above are made where necessary.  She has had AV block post MVR which is improving.  She has also had post termination pauses.  She has found these very concerning.  She and her husband are fearful.  Given long PR interval and EF of 45%, if she has a device  implanted, I would advise CRT-P. Risks, benefits, alternatives to biventricular pacemaker implantation were discussed in detail with the patient today. The patient understands that the risks include but are not limited to bleeding, infection, pneumothorax, perforation, tamponade, vascular damage, renal failure, MI, stroke, death,  and lead dislodgement and wishes to proceed. We will therefore schedule the procedure at the next available time.  I cannot perform until tomorrow.  I will check with Dr Curt Bears about feasibility of proceeding today.   Co Sign: [name], MD 06/08/2016 10:16 AM

## 2016-06-09 ENCOUNTER — Inpatient Hospital Stay (HOSPITAL_COMMUNITY): Payer: Managed Care, Other (non HMO)

## 2016-06-09 ENCOUNTER — Encounter (HOSPITAL_COMMUNITY): Payer: Self-pay | Admitting: Cardiology

## 2016-06-09 MED ORDER — METOPROLOL SUCCINATE ER 100 MG PO TB24: 100.0000 mg | ORAL_TABLET | Freq: Every day | ORAL | 3 refills | Status: DC

## 2016-06-09 MED ORDER — FUROSEMIDE 40 MG PO TABS: 40.0000 mg | ORAL_TABLET | Freq: Every day | ORAL | 0 refills | Status: DC

## 2016-06-09 MED ORDER — OXYCODONE HCL 5 MG PO TABS: ORAL_TABLET | ORAL | 0 refills | Status: DC

## 2016-06-09 MED ORDER — OXYCODONE HCL 5 MG PO TABS: 5.0000 mg | ORAL_TABLET | ORAL | 0 refills | Status: DC | PRN

## 2016-06-09 MED ORDER — RIVAROXABAN 20 MG PO TABS: 20.0000 mg | ORAL_TABLET | Freq: Every day | ORAL | 11 refills | Status: DC

## 2016-06-09 MED ORDER — POLYSACCHARIDE IRON COMPLEX 150 MG PO CAPS
150.0000 mg | ORAL_CAPSULE | Freq: Every day | ORAL | 0 refills | Status: DC
Start: 2016-06-09 — End: 2016-06-22

## 2016-06-09 MED ORDER — POTASSIUM CHLORIDE CRYS ER 20 MEQ PO TBCR: 20.0000 meq | EXTENDED_RELEASE_TABLET | Freq: Every day | ORAL | 0 refills | Status: DC

## 2016-06-09 MED ORDER — CYCLOBENZAPRINE HCL 10 MG PO TABS
5.0000 mg | ORAL_TABLET | Freq: Once | ORAL | Status: AC
  Administered 2016-06-09: 5 mg via ORAL
  Filled 2016-06-09: qty 1

## 2016-06-09 MED ORDER — POTASSIUM CHLORIDE CRYS ER 20 MEQ PO TBCR
40.0000 meq | EXTENDED_RELEASE_TABLET | Freq: Every day | ORAL | Status: DC
  Administered 2016-06-09: 40 meq via ORAL
  Filled 2016-06-09: qty 2

## 2016-06-09 MED ORDER — FA-PYRIDOXINE-CYANOCOBALAMIN 2.5-25-2 MG PO TABS: 1.0000 | ORAL_TABLET | Freq: Every day | ORAL | 0 refills | Status: DC

## 2016-06-09 MED ORDER — BISACODYL 5 MG PO TBEC
5.0000 mg | DELAYED_RELEASE_TABLET | Freq: Every day | ORAL | Status: DC | PRN
  Administered 2016-06-09: 5 mg via ORAL
  Filled 2016-06-09: qty 1

## 2016-06-09 MED ORDER — DOCUSATE SODIUM 100 MG PO CAPS
100.0000 mg | ORAL_CAPSULE | Freq: Every day | ORAL | Status: DC
  Administered 2016-06-09: 100 mg via ORAL
  Filled 2016-06-09: qty 1

## 2016-06-09 MED ORDER — POLYSACCHARIDE IRON COMPLEX 150 MG PO CAPS: 150.0000 mg | ORAL_CAPSULE | Freq: Every day | ORAL | 0 refills | Status: DC

## 2016-06-09 MED ORDER — ACETAMINOPHEN 325 MG PO TABS: 325.0000 mg | ORAL_TABLET | Freq: Four times a day (QID) | ORAL | Status: DC | PRN

## 2016-06-09 MED ORDER — METOPROLOL SUCCINATE ER 100 MG PO TB24: 100.0000 mg | ORAL_TABLET | Freq: Every day | ORAL | 1 refills | Status: DC

## 2016-06-09 NOTE — Discharge Instructions (Signed)
° ° ° ° °  Supplemental Discharge Instructions for  Pacemaker/Defibrillator Patients  Activity No heavy lifting or vigorous activity with your left/right arm for 6 to 8 weeks.  Do not raise your left/right arm above your head for one week.  Gradually raise your affected arm as drawn below.           __      06/13/16                   06/14/16                 06/15/16                06/16/16  NO DRIVING   WOUND CARE - Keep the wound area clean and dry.  Do not get this area wet for one week. No showers for one week; you may shower on 06/16/16    . - The tape/steri-strips on your wound will fall off; do not pull them off.  No bandage is needed on the site.  DO  NOT apply any creams, oils, or ointments to the wound area. - If you notice any drainage or discharge from the wound, any swelling or bruising at the site, or you develop a fever > 101? F after you are discharged home, call the office at once.  Special Instructions - You are still able to use cellular telephones; use the ear opposite the side where you have your pacemaker/defibrillator.  Avoid carrying your cellular phone near your device. - When traveling through airports, show security personnel your identification card to avoid being screened in the metal detectors.  Ask the security personnel to use the hand wand. - Avoid arc welding equipment, TENS units (transcutaneous nerve stimulators).  Call the office for questions about other devices. - Avoid electrical appliances that are in poor condition or are not properly grounded. - Microwave ovens are safe to be near or to operate.   Activity: 1.May walk up steps                2.No lifting more than ten pounds for four weeks.                 3.No driving for four weeks.                4.Stop any activity that causes chest pain, shortness of breath, dizziness,  sweating or excessive weakness.                5.Avoid straining.                6.Continue with your breathing exercises  daily.  Diet: Low fat, Low salt diet  Wound Care: May shower.  Clean wounds with mild soap and water daily. Contact the office at [phone] if any problems arise.

## 2016-06-09 NOTE — Progress Notes (Signed)
SUBJECTIVE: The patient is doing stable today.  No chest pain or shortness of breath.   CURRENT MEDICATIONS: . amiodarone  200 mg Oral BID  .  ceFAZolin (ANCEF) IV  1 g Intravenous Q6H  . cyclobenzaprine  5 mg Oral Once  . docusate sodium  100 mg Oral Daily  . fluticasone  2 spray Each Nare Daily  . folic acid-pyridoxine-cyancobalamin  1 tablet Oral Daily  . furosemide  40 mg Oral BID  . iron polysaccharides  150 mg Oral Daily  . metoprolol tartrate  50 mg Oral BID  . pantoprazole  40 mg Oral Daily  . potassium chloride  40 mEq Oral TID  . sodium chloride flush  10 mL Intravenous Q12H  . sodium chloride flush  3 mL Intravenous Q12H   . sodium chloride      OBJECTIVE: Physical Exam: Vitals:   06/08/16 2100 06/08/16 2246 06/09/16 0502 06/09/16 0700  BP: 124/70 105/63 (!) 132/53   Pulse: (!) 104 (!) 115 62   Resp:   20   Temp:   99.9 F (37.7 C) 98.8 F (37.1 C)  TempSrc:   Oral Oral  SpO2:  95% 91%   Weight:   275 lb 3.2 oz (124.8 kg)   Height:        Intake/Output Summary (Last 24 hours) at 06/09/16 0803 Last data filed at 06/09/16 0600  Gross per 24 hour  Intake              100 ml  Output              900 ml  Net             -800 ml    Telemetry reveals junctional rhythm, AV pacing  Subsequently AV paced  GEN- The patient is well appearing, alert and oriented x 3 today.   Head- normocephalic, atraumatic Eyes-  Sclera clear, conjunctiva pink Ears- hearing intact Oropharynx- clear Neck- supple  Lungs- Clear to ausculation bilaterally, normal work of breathing Heart- Regular rate and rhythm  GI- soft, NT, ND, + BS Extremities- no clubbing, cyanosis, or edema Skin- no rash or lesion, left chest without hematoma/ecchymosis  Psych- euthymic mood, full affect Neuro- strength and sensation are intact  LABS: CBC:  Recent Labs  06/07/16 0354  WBC 14.9*  HGB 7.9*  HCT 24.6*  MCV 84.8  PLT 328    RADIOLOGY: Dg Chest Port 1 View Result Date:  06/02/2016 CLINICAL DATA:  Atelectasis EXAM: PORTABLE CHEST 1 VIEW COMPARISON:  06/01/2016 FINDINGS: Cardiomediastinal silhouette is stable. Central mild vascular congestion without convincing pulmonary edema. Mild basilar atelectasis. Atrial appendage clip and status post median sternotomy again noted. IMPRESSION: Stable postsurgical changes. Mild basilar atelectasis. No convincing pulmonary edema. Electronically Signed   By: Lahoma Crocker M.D.   On: 06/02/2016 10:16   ASSESSMENT AND PLAN:  Principal Problem:   S/P Maze operation for atrial fibrillation + closure ASD Active Problems:   Atrial fibrillation (HCC)   SVT (supraventricular tachycardia) (HCC)   Obesity   Anxiety   Persistent atrial fibrillation (HCC)   Atrial septal defect   Cardiomyopathy ([place])   s/p atrial septal defect repair   Tachy-brady syndrome ([place])  1.  Symptomatic post termination pauses of up to 8 seconds CXR with leads in stable position Device interrogation normal Pt in accelerated junctional rhythm this morning - rate increased to 70 and rate response turned on to allow for AV synchrony Will need reactive ATP  turned on at wound check appt if lead stable  2.  Persistent atrial fibrillation S/p MAZE Continue amiodarone for now Ok to restart [place] from EP standpoint to discharge. Routine follow up arranged. Instructions entered in AVS   Chanetta Marshall, NP 06/09/2016 8:03 AM  I have seen, examined the patient, and reviewed the above assessment and plan.  On exam, RRR. Changes to above are made where necessary.  Device interrogation reviewed and reveals good lead parameters.  Device reprogrammed due to accelerated junctional rhythm.  Now DDDR 70 bpm.  Repeat ekg. CXR reveals stable leads, no ptx.  Co Sign: [name], MD 06/09/2016 12:22 PM

## 2016-06-09 NOTE — Progress Notes (Signed)
1300 Offered to walk with pt prior to discharge. Pt off telemetry and stated has been walking to bathroom. Stated ready to go home. Declined walk at this time. Graylon Good RN BSN 06/09/2016 12:57 PM

## 2016-06-09 NOTE — Progress Notes (Signed)
Patient to D/C home with husband. IV removed. Tele monitor. CCMD notified. Patient education done. No further questions at this time. Personal belongings given to patient. Patient to d/c home with husband.   Domingo Dimes RN

## 2016-06-09 NOTE — Progress Notes (Addendum)
      [address]       [place] [address]             [phone]        1 Day Post-Op Procedure(s) (LRB): Pacemaker Implant (N/A) Loop Recorder Explant  Subjective: Patient feels has muscle spasm left arm-requesting Flexeril.  Objective: Vital signs in last 24 hours: Temp:  [97.7 F (36.5 C)-99.9 F (37.7 C)] 98.8 F (37.1 C) (10/24 0700) Pulse Rate:  [54-120] 62 (10/24 0502) Cardiac Rhythm: Ventricular paced (10/23 1952) Resp:  [18-20] 20 (10/24 0502) BP: (105-132)/(49-70) 132/53 (10/24 0502) SpO2:  [91 %-95 %] 91 % (10/24 0502) FiO2 (%):  [37 %] 37 % (10/23 1622) Weight:  [275 lb 3.2 oz (124.8 kg)] 275 lb 3.2 oz (124.8 kg) (10/24 0502)  Pre op weight 125 kg Current Weight  06/09/16 275 lb 3.2 oz (124.8 kg)      Intake/Output from previous day: 10/23 0701 - 10/24 0700 In: 100 [IV Piggyback:100] Out: 900 [Urine:900]   Physical Exam:  Cardiovascular: Paced Pulmonary: Mostly clear Abdomen: Soft, non tender, bowel sounds present. Extremities: Mild lower extremity edema. Wounds: Clean and dry.  No erythema or signs of infection.  Lab Results: CBC:  Recent Labs  06/07/16 0354  WBC 14.9*  HGB 7.9*  HCT 24.6*  PLT 328   BMET:  No results for input(s): NA, K, CL, CO2, GLUCOSE, BUN, CREATININE, CALCIUM in the last 72 hours.  PT/INR:  Lab Results  Component Value Date   INR 1.28 05/28/2016   INR 0.98 05/26/2016   INR 1.06 10/16/2013   ABG:  INR: Will add last result for INR, ABG once components are confirmed Will add last 4 CBG results once components are confirmed  Assessment/Plan:  1. CV - Previous a fib then tachy brady syndrome.  S/p biventricular pacemaker and LINQ removal. Initially paced at 60 but increased to 70 after interrogation earlier this am. On Amiodarone 200 mg bid, Lopressor 50 mg bid.  Per Dr. Roxy Manns, will change Lopressor to Toprol XL 100 mg daily at discharge and restart Xarelto 2.  Pulmonary - On room air. CXR  this am appears stable. Encourage incentive spirometer. 3. Volume Overload - On Lasix 40 mg bid. Will change to daily at discharge. 4.  Acute blood loss anemia - Last H and H 7.9 and 24.6. Continue Foltx and Niferex. 5. Flexeril this am 6. Hope to discharge today  Josten Warmuth MPA-C 06/09/2016,7:29 AM

## 2016-06-10 ENCOUNTER — Telehealth: Payer: Self-pay | Admitting: Internal Medicine

## 2016-06-10 MED ORDER — AMIODARONE HCL 200 MG PO TABS
200.0000 mg | ORAL_TABLET | Freq: Two times a day (BID) | ORAL | 9 refills | Status: DC
Start: 2016-06-10 — End: 2016-06-22

## 2016-06-10 NOTE — Telephone Encounter (Signed)
Pt's Rx was sent to pt's pharmacy as requested. Confirmation received.  °

## 2016-06-10 NOTE — Telephone Encounter (Signed)
New message       *STAT* If patient is at the pharmacy, call can be transferred to refill team.   1. Which medications need to be refilled? (please list name of each medication and dose if known) amiodarone  2. Which pharmacy/location (including street and city if local pharmacy) is medication to be sent to? [facility] [address]  3. Do they need a 30 day or 90 day supply? 30 day Pt was recently discharged from the hosp and this medication was not called in.  They are at the pharmacy waiting

## 2016-06-15 ENCOUNTER — Telehealth: Payer: Self-pay | Admitting: Internal Medicine

## 2016-06-15 NOTE — Telephone Encounter (Signed)
Pt is a 55 yo F w/ a PMH significant for atrial fibrillation, tachy-brad s/p PPM, and HTN complaining of muscle spasms. Pain is left-sided and radiates to the bottom of the sternum and shoulder blades. Feels like a muscle spasm. Started yesterday. Today has been pretty constant. Patient can lean forward and will go away. It appears to be positional. If she lays back feels it more. Not pleuritic. Denies SOB or presyncope-syncope. Of note, PPM was placed about a week ago. Does not have an automated BP cuff at home. DDx includes AMI, pericarditis, migration of leads, PTX, pocket-related, MSK, etc. Advised pt that she should go to the ER if she is at all concerned to have EKG, blood work, and CXR. Pt would prefer to call this office tomorrow AM to schedule appointment.  Iban Utz P. Candyce Churn, MD Division of Cardiology

## 2016-06-16 ENCOUNTER — Encounter: Payer: Self-pay | Admitting: Internal Medicine

## 2016-06-16 ENCOUNTER — Ambulatory Visit (INDEPENDENT_AMBULATORY_CARE_PROVIDER_SITE_OTHER): Payer: Managed Care, Other (non HMO) | Admitting: *Deleted

## 2016-06-16 DIAGNOSIS — Z950 Presence of cardiac pacemaker: Secondary | ICD-10-CM

## 2016-06-16 LAB — CUP PACEART INCLINIC DEVICE CHECK
Battery Remaining Longevity: 84 mo
Battery Voltage: 3.17 V
Brady Statistic AS VP Percent: 0.07 %
Brady Statistic RA Percent Paced: 99.88 %
Implantable Lead Implant Date: 20171023
Implantable Lead Location: 753859
Implantable Lead Location: 753860
Implantable Lead Model: 4298
Implantable Lead Model: 5076
Implantable Lead Model: 5076
Implantable Pulse Generator Implant Date: 20171023
Lead Channel Impedance Value: 456 Ohm
Lead Channel Impedance Value: 494 Ohm
Lead Channel Impedance Value: 665 Ohm
Lead Channel Pacing Threshold Amplitude: 0.75 V
Lead Channel Pacing Threshold Amplitude: 0.75 V
Lead Channel Pacing Threshold Pulse Width: 0.4 ms
Lead Channel Pacing Threshold Pulse Width: 0.8 ms
Lead Channel Setting Pacing Amplitude: 2.75 V
Lead Channel Setting Pacing Amplitude: 3.5 V
Lead Channel Setting Pacing Pulse Width: 0.4 ms
Lead Channel Setting Pacing Pulse Width: 0.8 ms
MDC IDC LEAD IMPLANT DT: 20171023
MDC IDC LEAD IMPLANT DT: 20171023
MDC IDC LEAD LOCATION: 753858
MDC IDC MSMT LEADCHNL LV IMPEDANCE VALUE: 361 Ohm
MDC IDC MSMT LEADCHNL LV IMPEDANCE VALUE: 437 Ohm
MDC IDC MSMT LEADCHNL LV IMPEDANCE VALUE: 456 Ohm
MDC IDC MSMT LEADCHNL LV IMPEDANCE VALUE: 475 Ohm
MDC IDC MSMT LEADCHNL LV PACING THRESHOLD AMPLITUDE: 1.75 V
MDC IDC MSMT LEADCHNL RA IMPEDANCE VALUE: 285 Ohm
MDC IDC MSMT LEADCHNL RA IMPEDANCE VALUE: 456 Ohm
MDC IDC MSMT LEADCHNL RA PACING THRESHOLD PULSEWIDTH: 0.4 ms
MDC IDC MSMT LEADCHNL RV IMPEDANCE VALUE: 779 Ohm
MDC IDC SESS DTM: 20171031175549
MDC IDC SET LEADCHNL RA PACING AMPLITUDE: 3.5 V
MDC IDC SET LEADCHNL RV SENSING SENSITIVITY: 2 mV
MDC IDC STAT BRADY AP VP PERCENT: 98.21 %
MDC IDC STAT BRADY AP VS PERCENT: 1.67 %
MDC IDC STAT BRADY AS VS PERCENT: 0.05 %
MDC IDC STAT BRADY RV PERCENT PACED: 0.18 %

## 2016-06-16 NOTE — Progress Notes (Signed)
Wound check appointment, added-on for diaphragmatic stimulation, device checked with industry. Steri-strips remain in place until AS appointment on 06/24/16 (for wound check). Normal device function. Thresholds, sensing, and impedances consistent with implant measurements. AdaptivCRT reprogrammed to Adaptive Bi-V, LV vector to LV2 to LV3, LV output to 2.75V; all changes reviewed verbally with JA. Diaphragmatic stimulation resolved post-programming changes. RA and RV outputs at 3.5V with auto capture programmed on for extra safety margin until 3 month visit. Histogram distribution appropriate for patient and level of activity. No mode switches or high ventricular rates noted. Patient educated about wound care, arm mobility, lifting restrictions. ROV with AS on 06/24/16.

## 2016-06-19 ENCOUNTER — Other Ambulatory Visit: Payer: Self-pay | Admitting: Thoracic Surgery (Cardiothoracic Vascular Surgery)

## 2016-06-19 DIAGNOSIS — Z8679 Personal history of other diseases of the circulatory system: Secondary | ICD-10-CM

## 2016-06-19 DIAGNOSIS — Z9889 Other specified postprocedural states: Principal | ICD-10-CM

## 2016-06-22 ENCOUNTER — Ambulatory Visit (INDEPENDENT_AMBULATORY_CARE_PROVIDER_SITE_OTHER): Payer: Self-pay | Admitting: Thoracic Surgery (Cardiothoracic Vascular Surgery)

## 2016-06-22 ENCOUNTER — Encounter: Payer: Managed Care, Other (non HMO) | Admitting: Internal Medicine

## 2016-06-22 ENCOUNTER — Encounter: Payer: Self-pay | Admitting: Thoracic Surgery (Cardiothoracic Vascular Surgery)

## 2016-06-22 ENCOUNTER — Ambulatory Visit
Admission: RE | Admit: 2016-06-22 | Discharge: 2016-06-22 | Disposition: A | Discharge status: Home / Self Care | Payer: Managed Care, Other (non HMO) | Source: Ambulatory Visit | Attending: Thoracic Surgery (Cardiothoracic Vascular Surgery) | Admitting: Thoracic Surgery (Cardiothoracic Vascular Surgery)

## 2016-06-22 VITALS — BP 124/73 | HR 77 | Resp 16 | Ht 71.0 in | Wt 161.0 lb

## 2016-06-22 DIAGNOSIS — Z8679 Personal history of other diseases of the circulatory system: Secondary | ICD-10-CM

## 2016-06-22 DIAGNOSIS — I4819 Other persistent atrial fibrillation: Secondary | ICD-10-CM

## 2016-06-22 DIAGNOSIS — Z09 Encounter for follow-up examination after completed treatment for conditions other than malignant neoplasm: Secondary | ICD-10-CM

## 2016-06-22 DIAGNOSIS — Q211 Atrial septal defect: Secondary | ICD-10-CM

## 2016-06-22 DIAGNOSIS — I481 Persistent atrial fibrillation: Secondary | ICD-10-CM

## 2016-06-22 DIAGNOSIS — Q2111 Secundum atrial septal defect: Secondary | ICD-10-CM

## 2016-06-22 DIAGNOSIS — Z9889 Other specified postprocedural states: Secondary | ICD-10-CM

## 2016-06-22 MED ORDER — AMIODARONE HCL 200 MG PO TABS: 200.0000 mg | ORAL_TABLET | Freq: Every day | ORAL | 0 refills | Status: DC

## 2016-06-22 NOTE — Patient Instructions (Addendum)
Continue to avoid any heavy lifting or strenuous use of your arms or shoulders for at least a total of three months from the time of surgery.  After three months you may gradually increase how much you lift or otherwise use your arms or chest as tolerated, with limits based upon whether or not activities lead to the return of significant discomfort.  You may return to driving an automobile as long as you are no longer requiring oral narcotic pain relievers during the daytime.  It would be wise to start driving only short distances during the daylight and gradually increase from there as you feel comfortable.  Decrease amiodarone to 200 mg (1 tablet) daily  Stop taking lasix, potassium and iron  Continue all other previous medications without any changes at this time

## 2016-06-22 NOTE — Progress Notes (Signed)
[address]       [place] [address]             [phone]     CARDIOTHORACIC SURGERY OFFICE NOTE  Referring Provider is [name], MD PCP is Lucretia Kern., DO   HPI:  Patient is a 55 year old obese white female with multiple medical problems including nonischemic cardiomyopathy with chronic combined systolic and diastolic congestive heart failure and recurrent persistent atrial fibrillation that had failed catheter-based ablation and medical therapy with multiple drugs who returns to the office today for routine follow-up status post Maze procedure with closure of small atrial septal defect on 05/28/2016. The patient's early postoperative recovery in the hospital was notable for the development of tachybrady syndrome with recurrent episodes of rapid atrial fibrillation interspersed with slow junctional escape rhythm and occasional symptomatic pauses. She ultimately underwent implantation of permanent pacemaker with biventricular pacing capability by Dr. Curt Bears on 06/08/2016.  She was discharged home the following day and returns to our office today for routine follow-up. Approximately 1 week after discharge she began to experience symptoms related to phrenic nerve stimulation from her pacemaker which were corrected by altering the polarity of her pacemaker. At that time interrogation of her pacemaker revealed that the patient had remained in sinus rhythm without any further episodes of atrial fibrillation. She is scheduled to be seen in follow-up by Dr. Rayann Heman in the near future and she returns to our office for routine follow-up today. Overall the patient is quite pleased with her progress. She still has soreness across her chest although she has not been taking any sort of pain relievers for quite some time now. She gets tired with activity and her stamina has only started to recover. She also has had some problems with nausea but she admits that this seems to be  related to taking both amiodarone and iron on empty stomach. She has not had any palpitations or dizzy spells.   Current Outpatient Prescriptions  Medication Sig Dispense Refill  . acetaminophen (TYLENOL) 325 MG tablet Take 1-2 tablets (325-650 mg total) by mouth every 6 (six) hours as needed for mild pain or headache (For pain.).    Marland Kitchen amiodarone (PACERONE) 200 MG tablet Take 1 tablet (200 mg total) by mouth 2 (two) times daily. 60 tablet 9  . amoxicillin (AMOXIL) 500 MG capsule Take 2,000 mg by mouth daily as needed. Take 1 hour prior to dental appointment.  4  . diazepam (VALIUM) 5 MG tablet Take 5 mg by mouth at bedtime.     . diclofenac sodium (VOLTAREN) 1 % GEL Apply 2 g topically 2 (two) times daily as needed for pain.  1  . folic acid-pyridoxine-cyancobalamin (FOLTX) 2.5-25-2 MG TABS tablet Take 1 tablet by mouth daily. For one month then stop. 30 each 0  . furosemide (LASIX) 40 MG tablet Take 1 tablet (40 mg total) by mouth daily. 30 tablet 0  . iron polysaccharides (NIFEREX) 150 MG capsule Take 1 capsule (150 mg total) by mouth daily. For one month then stop. 30 capsule 0  . ketotifen (ZADITOR) 0.025 % ophthalmic solution Place 1 drop into both eyes 2 (two) times daily as needed (allergies/ itching).    . metoprolol succinate (TOPROL-XL) 100 MG 24 hr tablet Take 1 tablet (100 mg total) by mouth daily. Take with or immediately following a meal. 30 tablet 1  . Omega-3 Fatty Acids (FISH OIL PO) Take 1 capsule by mouth daily.     Marland Kitchen  pantoprazole (PROTONIX) 40 MG tablet TAKE 1 TABLET (40 MG TOTAL) BY MOUTH DAILY AS NEEDED (HEARTBURN). 90 tablet 0  . PAZEO 0.7 % SOLN Place 1 drop into both eyes daily as needed. For allergy eyes  4  . potassium chloride SA (K-DUR,KLOR-CON) 20 MEQ tablet Take 1 tablet (20 mEq total) by mouth daily. 30 tablet 0  . rivaroxaban (XARELTO) 20 MG TABS tablet Take 1 tablet (20 mg total) by mouth at bedtime. 30 tablet 11  . celecoxib (CELEBREX) 200 MG capsule Take 200 mg  by mouth daily.  1  . oxyCODONE (OXY IR/ROXICODONE) 5 MG immediate release tablet Take 5 mg every 4-6 hours PRN severe pain (Patient not taking: Reported on 06/22/2016) 28 tablet 0   No current facility-administered medications for this visit.       Physical Exam:   BP 124/73   Pulse 77   Resp 16   Ht 5\' 11"  (1.803 m)   Wt 161 lb (73 kg)   LMP 04/17/2012   SpO2 97% Comment: ON RA  BMI 22.45 kg/m   General:  Obese but well appearing  Chest:   Clear to auscultation  CV:   Regular rate and rhythm  Incisions:  Healing nicely, sternum is stable  Abdomen:  Soft nontender  Extremities:  Warm and well-perfused  Diagnostic Tests:  2 channel telemetry rhythm strip demonstrates paced rhythm   CHEST  2 VIEW  COMPARISON:  06/09/2016.  FINDINGS: The cardiac silhouette remains mildly enlarged. Stable left subclavian pacemaker leads and left atrial appendage clip. Clear lungs. Small left pleural effusion without significant change. Stable cervical spine fixation hardware.  IMPRESSION: 1. No acute abnormality. 2. Stable small left pleural effusion and mild cardiomegaly.   Electronically Signed   By: Claudie Revering M.D.   On: 06/22/2016 13:48    Impression:  Patient is doing well approximately one month following maze procedure with closure of small atrial septal defect. She has been maintaining stable paced rhythm without any recurrence of atrial fibrillation since her pacemaker was implanted.  Plan:  I have instructed the patient to decrease her dose of amiodarone to 200 mg daily. We will plan to keep her on amiodarone for at least another 6-8 weeks. I have also instructed her to stop taking Lasix and potassium. Otherwise she should continue to take all medications without change.  I have encouraged the patient to continue to gradually increase her physical activity as tolerated but I have reminded her to refrain from any sort of heavy lifting or strenuous use of her arms  or shoulders for at least another 2 months. I think she may resume driving an automobile. We have talked about a scheduled for return to work. I strongly encouraged the patient to enroll and participate in outpatient cardiac rehabilitation program.    Valentina Gu. Roxy Manns, MD 06/22/2016 2:32 PM

## 2016-06-24 ENCOUNTER — Encounter: Payer: Self-pay | Admitting: Nurse Practitioner

## 2016-06-24 ENCOUNTER — Ambulatory Visit (INDEPENDENT_AMBULATORY_CARE_PROVIDER_SITE_OTHER): Payer: Managed Care, Other (non HMO) | Admitting: Nurse Practitioner

## 2016-06-24 VITALS — BP 120/68 | HR 74 | Ht 71.0 in | Wt 269.4 lb

## 2016-06-24 DIAGNOSIS — I495 Sick sinus syndrome: Secondary | ICD-10-CM

## 2016-06-24 DIAGNOSIS — I481 Persistent atrial fibrillation: Secondary | ICD-10-CM

## 2016-06-24 DIAGNOSIS — I4819 Other persistent atrial fibrillation: Secondary | ICD-10-CM

## 2016-06-24 DIAGNOSIS — Z23 Encounter for immunization: Secondary | ICD-10-CM | POA: Diagnosis not present

## 2016-06-24 DIAGNOSIS — I519 Heart disease, unspecified: Secondary | ICD-10-CM

## 2016-06-24 LAB — CUP PACEART INCLINIC DEVICE CHECK
Implantable Lead Implant Date: 20171023
Implantable Lead Location: 753859
Implantable Lead Model: 4298
Implantable Lead Model: 5076
Implantable Pulse Generator Implant Date: 20171023
MDC IDC LEAD IMPLANT DT: 20171023
MDC IDC LEAD IMPLANT DT: 20171023
MDC IDC LEAD LOCATION: 753858
MDC IDC LEAD LOCATION: 753860
MDC IDC SESS DTM: 20171108131705

## 2016-06-24 LAB — BASIC METABOLIC PANEL
BUN: 11 mg/dL (ref 7–25)
CO2: 27 mmol/L (ref 20–31)
CREATININE: 0.91 mg/dL (ref 0.50–1.05)
Calcium: 9.4 mg/dL (ref 8.6–10.4)
Chloride: 103 mmol/L (ref 98–110)
Glucose, Bld: 117 mg/dL — ABNORMAL HIGH (ref 65–99)
Potassium: 4.1 mmol/L (ref 3.5–5.3)
Sodium: 140 mmol/L (ref 135–146)

## 2016-06-24 LAB — CBC
HEMATOCRIT: 33.7 % — AB (ref 35.0–45.0)
HEMOGLOBIN: 10.6 g/dL — AB (ref 11.7–15.5)
MCH: 26.8 pg — ABNORMAL LOW (ref 27.0–33.0)
MCHC: 31.5 g/dL — ABNORMAL LOW (ref 32.0–36.0)
MCV: 85.3 fL (ref 80.0–100.0)
MPV: 8.6 fL (ref 7.5–12.5)
Platelets: 458 10*3/uL — ABNORMAL HIGH (ref 140–400)
RBC: 3.95 MIL/uL (ref 3.80–5.10)
RDW: 15.4 % — ABNORMAL HIGH (ref 11.0–15.0)
WBC: 8.5 10*3/uL (ref 3.8–10.8)

## 2016-06-24 NOTE — Patient Instructions (Signed)
Medication Instructions:   Your physician recommends that you continue on your current medications as directed. Please refer to the Current Medication list given to you today.    If you need a refill on your cardiac medications before your next appointment, please call your pharmacy.  Labwork: BMET AND CBC   Testing/Procedures: NONE ORDERED  TODAY    Follow-Up: 4 TO 6 WEEKS  WITH SEILER OR ALLRED    Any Other Special Instructions Will Be Listed Below (If Applicable).

## 2016-06-24 NOTE — Progress Notes (Signed)
Electrophysiology Office Note Date: 06/24/2016  ID:  Destiny Dennis, DOB Nov 09, 1960, MRN [id number]  PCP: Lucretia Kern., DO Primary Cardiologist: Stanford Breed Electrophysiologist: Allred CT Surgery: Roxy Manns  CC: Pacemaker follow-up  Destiny Dennis is a 55 y.o. female seen today for Dr Rayann Heman.  She presents today for routine electrophysiology followup. She recently underwent MAZE and ASD repair with Dr Roxy Manns. Post op, she had atrial fibrillation and recurrent post termination pauses of >6 seconds associated with syncope.  Prior to discharge, she underwent MDT CRTP pacemaker implantation.   Since discharge, the patient reports doing relatively well.  She does have nausea and her amiodarone dose was reduced earlier this week by Dr Roxy Manns.   She denies chest pain, palpitations, dyspnea, PND, orthopnea, vomiting, dizziness, syncope, edema, weight gain, or early satiety.  Device History: MDT CRTP implanted 2017 for tachy/brady syndrome, NICM   Past Medical History:  Diagnosis Date  . Allergic rhinitis   . Anxiety    when tachycardia occurs  . Arthritis    OA, s/p numerous surgeries -back, knees, hip  . Atrial flutter ([place])   . Atrial septal defect 01/01/2016   Discovered on TEE   . Benign fundic gland polyps of stomach   . Cervical disc disease   . Complication of anesthesia   . Diverticulitis    CT Scan   . DVT (deep venous thrombosis) ([place])    in pregnancy, s/p hip surgery  . Eustachian tube dysfunction   . GERD 12/23/2009   diet related-not a problem  . Headache(784.0)    migraines occ.  Marland Kitchen Hemorrhoids   . Hiatal hernia    small  . History of MRSA infection 2008   superficial skin-cleared easily with doxycycline  . Hypertension   . Obesity   . Peripheral vascular disease ([place])    left leg after hip surgery  . Persistent atrial fibrillation ([place]) 03/05/2007   a. s/p RFCA 12/18/2011, 07/29/12  . PONV (postoperative nausea and vomiting)   . s/p atrial septal defect repair  05/28/2016  . S/P Maze operation for atrial fibrillation 05/28/2016   Complete bilateral atrial lesion set using cryothermy and bipolar radiofrequency ablation with clipping of LA appendage via median sternotomy  . Sialoadenitis   . SVT (supraventricular tachycardia) ([place]) 03/18/2010   PVCs  . Tachy-brady syndrome [facility])    Past Surgical History:  Procedure Laterality Date  . ASD REPAIR N/A 05/28/2016   Procedure: ATRIAL SEPTAL DEFECT (ASD) closure;  Surgeon: Rexene Alberts, MD;  Location: [place];  Service: Open Heart Surgery;  Laterality: N/A;  . ATRIAL FIBRILLATION ABLATION  07/29/2012   PVI by Dr Rayann Heman (second procedure)  . ATRIAL FIBRILLATION ABLATION N/A 12/18/2011   Procedure: ATRIAL FIBRILLATION ABLATION;  Surgeon: [name], MD;  Location: [facility] CATH LAB;  Service: Cardiovascular;  Laterality: N/A;  . ATRIAL FIBRILLATION ABLATION N/A 07/29/2012   Procedure: ATRIAL FIBRILLATION ABLATION;  Surgeon: [name], MD;  Location: [facility] CATH LAB;  Service: Cardiovascular;  Laterality: N/A;  . atrial fibrillation and atrial flutter ablation  12/17/11, 07/29/12   PVI and CTI ablations by Dr Rayann Heman  . CARDIAC CATHETERIZATION N/A 04/10/2016   Procedure: Right/Left Heart Cath and Coronary Angiography;  Surgeon: Jettie Booze, MD;  Location: [place] CV LAB;  Service: Cardiovascular;  Laterality: N/A;  . CERVICAL FUSION    . CESAREAN SECTION     x 2  . CHOLECYSTECTOMY    . CLIPPING OF ATRIAL APPENDAGE  05/28/2016  Procedure: CLIPPING OF ATRIAL APPENDAGE;  Surgeon: Rexene Alberts, MD;  Location: [place];  Service: Open Heart Surgery;;  . COLONOSCOPY  07/16/2011   diverticulosis  . ELBOW SURGERY     Right  . EP IMPLANTABLE DEVICE N/A 09/10/2015   Procedure: Loop Recorder Insertion;  Surgeon: [name], MD;  Location: [place] CV LAB;  Service: Cardiovascular;  Laterality: N/A;  . EP IMPLANTABLE DEVICE N/A 06/08/2016   Procedure: Pacemaker Implant;  Surgeon: Will Meredith Leeds, MD;   Location: [place] CV LAB;  Service: Cardiovascular;  Laterality: N/A;  . FLEXIBLE SIGMOIDOSCOPY     1990's  . FOOT SURGERY     Right   . I&D KNEE WITH POLY EXCHANGE Left 12/12/2012   Procedure: LEFT KNEE EXCISION SAPHENOUS NEUROMA/OPEN SCAR DEBRIDEMENT/POLY EXCHANGE/NERVE EXCISION;  Surgeon: Mauri Pole, MD;  Location: [facility];  Service: Orthopedics;  Laterality: Left;  . JOINT REPLACEMENT Bilateral    4'14  knees  . KNEE SURGERY     x 9 Left Knee  . MAZE  05/28/2016   Procedure: MAZE;  Surgeon: Rexene Alberts, MD;  Location: [place];  Service: Open Heart Surgery;;  . MEDIASTERNOTOMY N/A 05/28/2016   Procedure: MEDIAN STERNOTOMY;  Surgeon: Rexene Alberts, MD;  Location: [place];  Service: Open Heart Surgery;  Laterality: N/A;  . SHOULDER SURGERY     Right   . TEE WITHOUT CARDIOVERSION  12/17/2011   Procedure: TRANSESOPHAGEAL ECHOCARDIOGRAM (TEE);  Surgeon: Larey Dresser, MD;  Location: [facility] ENDOSCOPY;  Service: Cardiovascular;  Laterality: N/A;  . TEE WITHOUT CARDIOVERSION  07/28/2012   Procedure: TRANSESOPHAGEAL ECHOCARDIOGRAM (TEE);  Surgeon: Thayer Headings, MD;  Location: [place];  Service: Cardiovascular;  Laterality: N/A;  . TEE WITHOUT CARDIOVERSION N/A 01/01/2016   Procedure: TRANSESOPHAGEAL ECHOCARDIOGRAM (TEE);  Surgeon: Sanda Klein, MD;  Location: [facility] ENDOSCOPY;  Service: Cardiovascular;  Laterality: N/A;  . TEE WITHOUT CARDIOVERSION N/A 05/28/2016   Procedure: TRANSESOPHAGEAL ECHOCARDIOGRAM (TEE);  Surgeon: Rexene Alberts, MD;  Location: [place];  Service: Open Heart Surgery;  Laterality: N/A;  . TOTAL HIP ARTHROPLASTY Left 10/17/2013   Procedure: LEFT TOTAL HIP ARTHROPLASTY ANTERIOR APPROACH;  Surgeon: Mauri Pole, MD;  Location: [facility];  Service: Orthopedics;  Laterality: Left;  . TOTAL KNEE ARTHROPLASTY Right 12/12/2012   Procedure: RIGHT TOTAL KNEE ARTHROPLASTY;  Surgeon: Mauri Pole, MD;  Location: [facility];  Service: Orthopedics;  Laterality: Right;  . UPPER  GASTROINTESTINAL ENDOSCOPY  01/30/2010   hiatal hernia, fundic gland polyps  . WRIST SURGERY     Left     Current Outpatient Prescriptions  Medication Sig Dispense Refill  . acetaminophen (TYLENOL) 325 MG tablet Take 1-2 tablets (325-650 mg total) by mouth every 6 (six) hours as needed for mild pain or headache (For pain.).    Marland Kitchen amiodarone (PACERONE) 200 MG tablet Take 1 tablet (200 mg total) by mouth daily. 60 tablet 0  . diclofenac sodium (VOLTAREN) 1 % GEL Apply 2 g topically 2 (two) times daily as needed for pain.  1  . folic acid-pyridoxine-cyancobalamin (FOLTX) 2.5-25-2 MG TABS tablet Take 1 tablet by mouth daily. For one month then stop. 30 each 0  . ketotifen (ZADITOR) 0.025 % ophthalmic solution Place 1 drop into both eyes 2 (two) times daily as needed (allergies/ itching).    . metoprolol succinate (TOPROL-XL) 100 MG 24 hr tablet Take 1 tablet (100 mg total) by mouth daily. Take with or immediately following a meal. 30 tablet  1  . Omega-3 Fatty Acids (FISH OIL PO) Take 1 capsule by mouth daily.     . pantoprazole (PROTONIX) 40 MG tablet TAKE 1 TABLET (40 MG TOTAL) BY MOUTH DAILY AS NEEDED (HEARTBURN). 90 tablet 0  . rivaroxaban (XARELTO) 20 MG TABS tablet Take 1 tablet (20 mg total) by mouth at bedtime. 30 tablet 11  . amoxicillin (AMOXIL) 500 MG capsule Take 2,000 mg by mouth daily as needed. Take 1 hour prior to dental appointment.  4  . celecoxib (CELEBREX) 200 MG capsule Take 200 mg by mouth daily.  1  . diazepam (VALIUM) 5 MG tablet Take 5 mg by mouth at bedtime.     Marland Kitchen oxyCODONE (OXY IR/ROXICODONE) 5 MG immediate release tablet Take 5 mg every 4-6 hours PRN severe pain (Patient not taking: Reported on 06/24/2016) 28 tablet 0  . PAZEO 0.7 % SOLN Place 1 drop into both eyes daily as needed. For allergy eyes  4   No current facility-administered medications for this visit.     Allergies:   Avelox [moxifloxacin hcl in nacl]   Social History: Social History   Social History  .  Marital status: Married    Spouse name: N/A  . Number of children: 2  . Years of education: N/A   Occupational History  . [employer]     Workers Comp   Social History Main Topics  . Smoking status: Never Smoker  . Smokeless tobacco: Never Used  . Alcohol use Yes     Comment: rare social  . Drug use: No  . Sexual activity: Yes     Comment: has periods q 3-4 months- no possiblity pregnant per pt.07-16-11   Other Topics Concern  . Not on file   Social History Narrative   Updated 06/2015   Daily Caffeine    Pt lives in [place] with spouse and two children 34 and 58 yo in 2-16.  She works as a workers Dance movement psychotherapist.   Christian   No regular exercise, diet ok    Family History: Family History  Problem Relation Age of Onset  . Diabetes Maternal Grandfather   . Diverticulitis Brother     x 2  . Diverticulitis Paternal Grandmother   . Colon cancer Neg Hx      Review of Systems: All other systems reviewed and are otherwise negative except as noted above.   Physical Exam: VS:  BP 120/68 (BP Location: Left Arm, Patient Position: Sitting, Cuff Size: Large)   Pulse 74   Ht 5\' 11"  (1.803 m)   Wt 269 lb 6.4 oz (122.2 kg)   LMP 04/17/2012   SpO2 95%   BMI 37.57 kg/m  , BMI Body mass index is 37.57 kg/m.  GEN- The patient is well appearing, alert and oriented x 3 today.   HEENT: normocephalic, atraumatic; sclera clear, conjunctiva pink; hearing intact; oropharynx clear; neck supple  Lungs- Clear to ausculation bilaterally, normal work of breathing.  No wheezes, rales, rhonchi Heart- Regular rate and rhythm  GI- soft, non-tender, non-distended, bowel sounds present  Extremities- no clubbing, cyanosis, or edema  MS- no significant deformity or atrophy Skin- warm and dry, no rash or lesion; PPM pocket well healed Psych- euthymic mood, full affect Neuro- strength and sensation are intact  PPM Interrogation- reviewed in detail today,  See PACEART report  EKG:   EKG is ordered today. The ekg ordered today shows sinus rhythm with ventricular pacing, QRS 174msec  Recent Labs: 08/21/2015: TSH 2.217  05/26/2016: ALT 25 06/04/2016: BUN 9; Creatinine, Ser 0.83; Magnesium 1.9; Potassium 4.2; Sodium 135 06/07/2016: Hemoglobin 7.9; Platelets 328   Wt Readings from Last 3 Encounters:  06/24/16 269 lb 6.4 oz (122.2 kg)  06/22/16 161 lb (73 kg)  06/09/16 275 lb 3.2 oz (124.8 kg)     Other studies Reviewed: Additional studies/ records that were reviewed today include: hospital records, Dr Guy Sandifer office notes  Assessment and Plan:  1.  Tachy/brady syndrome Normal PPM function See Pace Art report No changes today PPM incision well healed  2.  Persistent atrial fibrillation Burden by device interrogation today 0% Continue Xarelto for CHADS2VASC of 2 CBC, BMET today  Continue low dose amiodarone for another 6-8 weeks per Dr Roxy Manns  3.  LV dysfunction Euvolemic on exam Continue current therapy Update echo in 6 months    Current medicines are reviewed at length with the patient today.   The patient does not have concerns regarding her medicines.  The following changes were made today:  none  Labs/ tests ordered today include: echo 12/2016; BMET, CBC today  Orders Placed This Encounter  Procedures  . Implantable device check  . EKG 12-Lead    Disposition:   Follow up with me 6 weeks, Dr Rayann Heman 3 months   Signed, Chanetta Marshall, NP 06/24/2016 1:33 PM  [place] [address] [place] [address] [phone] (office) [phone] (fax

## 2016-06-25 ENCOUNTER — Telehealth: Payer: Self-pay | Admitting: *Deleted

## 2016-06-25 NOTE — Telephone Encounter (Signed)
-----   Message from Patsey Berthold, NP sent at 06/25/2016  8:25 AM EST ----- Please notify patient of stable labs.  Hemoglobin improved.

## 2016-06-25 NOTE — Telephone Encounter (Signed)
SPOKE TO PT ABOUT RESULTS AND VERBALIZED UNDERSTANDING  

## 2016-07-04 LAB — CUP PACEART REMOTE DEVICE CHECK
Implantable Lead Implant Date: 20171023
Implantable Lead Implant Date: 20171023
Implantable Lead Location: 753858
Implantable Lead Location: 753860
Implantable Pulse Generator Implant Date: 20170124
MDC IDC LEAD IMPLANT DT: 20171023
MDC IDC LEAD LOCATION: 753859
MDC IDC SESS DTM: 20171021143936

## 2016-07-04 NOTE — Progress Notes (Signed)
Carelink summary report received. Battery status OK. Normal device function. No new symptom episodes, tachy episodes, brady, or pause episodes. 100% AF. Monthly summary reports and ROV/PRN

## 2016-07-20 ENCOUNTER — Ambulatory Visit (INDEPENDENT_AMBULATORY_CARE_PROVIDER_SITE_OTHER): Payer: Managed Care, Other (non HMO) | Admitting: Family Medicine

## 2016-07-20 ENCOUNTER — Encounter: Payer: Self-pay | Admitting: Family Medicine

## 2016-07-20 VITALS — BP 112/70 | HR 69 | Temp 98.2°F | Ht 71.0 in | Wt 267.0 lb

## 2016-07-20 DIAGNOSIS — J069 Acute upper respiratory infection, unspecified: Secondary | ICD-10-CM | POA: Diagnosis not present

## 2016-07-20 MED ORDER — HYDROCOD POLST-CPM POLST ER 10-8 MG/5ML PO SUER: 5.0000 mL | Freq: Two times a day (BID) | ORAL | 0 refills | Status: DC | PRN

## 2016-07-20 MED ORDER — AMOXICILLIN-POT CLAVULANATE 875-125 MG PO TABS
1.0000 | ORAL_TABLET | Freq: Two times a day (BID) | ORAL | 0 refills | Status: DC
Start: 2016-07-20 — End: 2016-09-21

## 2016-07-20 NOTE — Patient Instructions (Signed)
INSTRUCTIONS FOR UPPER RESPIRATORY INFECTION:  -antibiotic if worsening, or if symptoms persist   -plenty of rest and fluids  -nasal saline wash 2-3 times daily (use prepackaged nasal saline or bottled/distilled water if making your own)   -can use AFRIN nasal spray for drainage and nasal congestion - but do NOT use longer then 3-4 days  -in the winter time, using a humidifier at night is helpful (please follow cleaning instructions)  -if you are taking a cough medication - use only as directed, may also try a teaspoon of honey to coat the throat and throat lozenges. If given a cough medication with codeine or hydrocodone or other narcotic please be advised that this contains a strong and  potentially addicting medication. Please follow instructions carefully, take as little as possible and only use AS NEEDED for severe cough. Discuss potential side effects with your pharmacy. Please do not drive or operate machinery while taking these types of medications. Please do not take other sedating medications, drugs or alcohol while taking this medication without discussing with your doctor.  -for sore throat, salt water gargles can help  -follow up if you have fevers, facial pain, tooth pain, difficulty breathing or are worsening or symptoms persist longer then expected  Upper Respiratory Infection, Adult An upper respiratory infection (URI) is also known as the common cold. It is often caused by a type of germ (virus). Colds are easily spread (contagious). You can pass it to others by kissing, coughing, sneezing, or drinking out of the same glass. Usually, you get better in 1 to 3  weeks.  However, the cough can last for even longer. HOME CARE   Only take medicine as told by your doctor. Follow instructions provided above.  Drink enough water and fluids to keep your pee (urine) clear or pale yellow.  Get plenty of rest.  Return to work when your temperature is < 100 for 24 hours or as told by  your doctor. You may use a face mask and wash your hands to stop your cold from spreading. GET HELP RIGHT AWAY IF:   After the first few days, you feel you are getting worse.  You have questions about your medicine.  You have chills, shortness of breath, or red spit (mucus).  You have pain in the face for more then 1-2 days, especially when you bend forward.  You have a fever, puffy (swollen) neck, pain when you swallow, or white spots in the back of your throat.  You have a bad headache, ear pain, sinus pain, or chest pain.  You have a high-pitched whistling sound when you breathe in and out (wheezing).  You cough up blood.  You have sore muscles or a stiff neck. MAKE SURE YOU:   Understand these instructions.  Will watch your condition.  Will get help right away if you are not doing well or get worse. Document Released: 01/20/2008 Document Revised: 10/26/2011 Document Reviewed: 11/08/2013 [facility] Patient Information 2015 [place], [place]. This information is not intended to replace advice given to you by your health care provider. Make sure you discuss any questions you have with your health care provider.

## 2016-07-20 NOTE — Progress Notes (Signed)
Pre visit review using our clinic review tool, if applicable. No additional management support is needed unless otherwise documented below in the visit note. 

## 2016-07-20 NOTE — Progress Notes (Signed)
HPI:  URI: -started: 1 week ago - low grade temp and body aches initially - these have resolved -symptoms:nasal congestion, sore throat, cough - now worsening cough, sinus pain R maxillary, green and yellow mucus -denies:fever, SOB, NVD, tooth pain -has tried: OTC cold meds not helping, does not feel tessalon helps -sick contacts/travel/risks: no reported flu, strep or tick exposure - kids with same treated with abx and improving -Hx of: allergies  ROS: See pertinent positives and negatives per HPI.  Past Medical History:  Diagnosis Date  . Allergic rhinitis   . Anxiety    when tachycardia occurs  . Arthritis    OA, s/p numerous surgeries -back, knees, hip  . Atrial flutter ([place])   . Atrial septal defect 01/01/2016   Discovered on TEE   . Benign fundic gland polyps of stomach   . Cervical disc disease   . Complication of anesthesia   . Diverticulitis    CT Scan   . DVT (deep venous thrombosis) ([place])    in pregnancy, s/p hip surgery  . Eustachian tube dysfunction   . GERD 12/23/2009   diet related-not a problem  . Headache(784.0)    migraines occ.  Marland Kitchen Hemorrhoids   . Hiatal hernia    small  . History of MRSA infection 2008   superficial skin-cleared easily with doxycycline  . Hypertension   . Obesity   . Peripheral vascular disease ([place])    left leg after hip surgery  . Persistent atrial fibrillation ([place]) 03/05/2007   a. s/p RFCA 12/18/2011, 07/29/12  . PONV (postoperative nausea and vomiting)   . s/p atrial septal defect repair 05/28/2016  . S/P Maze operation for atrial fibrillation 05/28/2016   Complete bilateral atrial lesion set using cryothermy and bipolar radiofrequency ablation with clipping of LA appendage via median sternotomy  . Sialoadenitis   . SVT (supraventricular tachycardia) ([place]) 03/18/2010   PVCs  . Tachy-brady syndrome [facility])     Past Surgical History:  Procedure Laterality Date  . ASD REPAIR N/A 05/28/2016   Procedure: ATRIAL SEPTAL DEFECT  (ASD) closure;  Surgeon: Rexene Alberts, MD;  Location: [place];  Service: Open Heart Surgery;  Laterality: N/A;  . ATRIAL FIBRILLATION ABLATION  07/29/2012   PVI by Dr Rayann Heman (second procedure)  . ATRIAL FIBRILLATION ABLATION N/A 12/18/2011   Procedure: ATRIAL FIBRILLATION ABLATION;  Surgeon: [name], MD;  Location: [facility] CATH LAB;  Service: Cardiovascular;  Laterality: N/A;  . ATRIAL FIBRILLATION ABLATION N/A 07/29/2012   Procedure: ATRIAL FIBRILLATION ABLATION;  Surgeon: [name], MD;  Location: [facility] CATH LAB;  Service: Cardiovascular;  Laterality: N/A;  . atrial fibrillation and atrial flutter ablation  12/17/11, 07/29/12   PVI and CTI ablations by Dr Rayann Heman  . CARDIAC CATHETERIZATION N/A 04/10/2016   Procedure: Right/Left Heart Cath and Coronary Angiography;  Surgeon: Jettie Booze, MD;  Location: [place] CV LAB;  Service: Cardiovascular;  Laterality: N/A;  . CERVICAL FUSION    . CESAREAN SECTION     x 2  . CHOLECYSTECTOMY    . CLIPPING OF ATRIAL APPENDAGE  05/28/2016   Procedure: CLIPPING OF ATRIAL APPENDAGE;  Surgeon: Rexene Alberts, MD;  Location: [place];  Service: Open Heart Surgery;;  . COLONOSCOPY  07/16/2011   diverticulosis  . ELBOW SURGERY     Right  . EP IMPLANTABLE DEVICE N/A 09/10/2015   Procedure: Loop Recorder Insertion;  Surgeon: [name], MD;  Location: [place] CV LAB;  Service: Cardiovascular;  Laterality: N/A;  .  EP IMPLANTABLE DEVICE N/A 06/08/2016   Procedure: Pacemaker Implant;  Surgeon: Will Meredith Leeds, MD;  Location: [place] CV LAB;  Service: Cardiovascular;  Laterality: N/A;  . FLEXIBLE SIGMOIDOSCOPY     1990's  . FOOT SURGERY     Right   . I&D KNEE WITH POLY EXCHANGE Left 12/12/2012   Procedure: LEFT KNEE EXCISION SAPHENOUS NEUROMA/OPEN SCAR DEBRIDEMENT/POLY EXCHANGE/NERVE EXCISION;  Surgeon: Mauri Pole, MD;  [facility];  Service: Orthopedics;  Laterality: Left;  . JOINT REPLACEMENT Bilateral    4'14  knees  . KNEE SURGERY      x 9 Left Knee  . MAZE  05/28/2016   Procedure: MAZE;  Surgeon: Rexene Alberts, MD;  Location: [place];  Service: Open Heart Surgery;;  . MEDIASTERNOTOMY N/A 05/28/2016   Procedure: MEDIAN STERNOTOMY;  Surgeon: Rexene Alberts, MD;  Location: [place];  Service: Open Heart Surgery;  Laterality: N/A;  . SHOULDER SURGERY     Right   . TEE WITHOUT CARDIOVERSION  12/17/2011   Procedure: TRANSESOPHAGEAL ECHOCARDIOGRAM (TEE);  Surgeon: Larey Dresser, MD;  Location: [facility] ENDOSCOPY;  Service: Cardiovascular;  Laterality: N/A;  . TEE WITHOUT CARDIOVERSION  07/28/2012   Procedure: TRANSESOPHAGEAL ECHOCARDIOGRAM (TEE);  Surgeon: Thayer Headings, MD;  Location: [place];  Service: Cardiovascular;  Laterality: N/A;  . TEE WITHOUT CARDIOVERSION N/A 01/01/2016   Procedure: TRANSESOPHAGEAL ECHOCARDIOGRAM (TEE);  Surgeon: Sanda Klein, MD;  Location: [facility] ENDOSCOPY;  Service: Cardiovascular;  Laterality: N/A;  . TEE WITHOUT CARDIOVERSION N/A 05/28/2016   Procedure: TRANSESOPHAGEAL ECHOCARDIOGRAM (TEE);  Surgeon: Rexene Alberts, MD;  Location: [place];  Service: Open Heart Surgery;  Laterality: N/A;  . TOTAL HIP ARTHROPLASTY Left 10/17/2013   Procedure: LEFT TOTAL HIP ARTHROPLASTY ANTERIOR APPROACH;  Surgeon: Mauri Pole, MD;  [facility];  Service: Orthopedics;  Laterality: Left;  . TOTAL KNEE ARTHROPLASTY Right 12/12/2012   Procedure: RIGHT TOTAL KNEE ARTHROPLASTY;  Surgeon: Mauri Pole, MD;  [facility];  Service: Orthopedics;  Laterality: Right;  . UPPER GASTROINTESTINAL ENDOSCOPY  01/30/2010   hiatal hernia, fundic gland polyps  . WRIST SURGERY     Left     Family History  Problem Relation Age of Onset  . Diabetes Maternal Grandfather   . Diverticulitis Brother     x 2  . Diverticulitis Paternal Grandmother   . Colon cancer Neg Hx     Social History   Social History  . Marital status: Married    Spouse name: N/A  . Number of children: 2  . Years of education: N/A    Occupational History  . [employer]     Workers Comp   Social History Main Topics  . Smoking status: Never Smoker  . Smokeless tobacco: Never Used  . Alcohol use Yes     Comment: rare social  . Drug use: No  . Sexual activity: Yes     Comment: has periods q 3-4 months- no possiblity pregnant per pt.07-16-11   Other Topics Concern  . None   Social History Narrative   Updated 06/2015   Daily Caffeine    Pt lives in [place] with spouse and two children 30 and 79 yo in 2-16.  She works as a workers Dance movement psychotherapist.   Christian   No regular exercise, diet ok     Current Outpatient Prescriptions:  .  acetaminophen (TYLENOL) 325 MG tablet, Take 1-2 tablets (325-650 mg total) by mouth every 6 (six) hours  as needed for mild pain or headache (For pain.)., Disp: , Rfl:  .  amiodarone (PACERONE) 200 MG tablet, Take 1 tablet (200 mg total) by mouth daily., Disp: 60 tablet, Rfl: 0 .  amoxicillin (AMOXIL) 500 MG capsule, Take 2,000 mg by mouth daily as needed. Take 1 hour prior to dental appointment., Disp: , Rfl: 4 .  diazepam (VALIUM) 5 MG tablet, Take 5 mg by mouth at bedtime. , Disp: , Rfl:  .  diclofenac sodium (VOLTAREN) 1 % GEL, Apply 2 g topically 2 (two) times daily as needed for pain., Disp: , Rfl: 1 .  folic acid-pyridoxine-cyancobalamin (FOLTX) 2.5-25-2 MG TABS tablet, Take 1 tablet by mouth daily. For one month then stop., Disp: 30 each, Rfl: 0 .  ketotifen (ZADITOR) 0.025 % ophthalmic solution, Place 1 drop into both eyes 2 (two) times daily as needed (allergies/ itching)., Disp: , Rfl:  .  metoprolol succinate (TOPROL-XL) 100 MG 24 hr tablet, Take 1 tablet (100 mg total) by mouth daily. Take with or immediately following a meal., Disp: 30 tablet, Rfl: 1 .  Omega-3 Fatty Acids (FISH OIL PO), Take 1 capsule by mouth daily. , Disp: , Rfl:  .  pantoprazole (PROTONIX) 40 MG tablet, TAKE 1 TABLET (40 MG TOTAL) BY MOUTH DAILY AS NEEDED (HEARTBURN)., Disp: 90 tablet, Rfl:  0 .  rivaroxaban (XARELTO) 20 MG TABS tablet, Take 1 tablet (20 mg total) by mouth at bedtime., Disp: 30 tablet, Rfl: 11 .  amoxicillin-clavulanate (AUGMENTIN) 875-125 MG tablet, Take 1 tablet by mouth 2 (two) times daily., Disp: 20 tablet, Rfl: 0 .  chlorpheniramine-HYDROcodone (TUSSIONEX PENNKINETIC ER) 10-8 MG/5ML SUER, Take 5 mLs by mouth every 12 (twelve) hours as needed for cough., Disp: 114 mL, Rfl: 0  EXAM:  Vitals:   07/20/16 1039  BP: 112/70  Pulse: 69  Temp: 98.2 F (36.8 C)    Body mass index is 37.24 kg/m.  GENERAL: vitals reviewed and listed above, alert, oriented, appears well hydrated and in no acute distress  HEENT: atraumatic, conjunttiva clear, no obvious abnormalities on inspection of external nose and ears, normal appearance of ear canals and TMs, clear nasal congestion, mild post oropharyngeal erythema with PND, no tonsillar edema or exudate, no sinus TTP  NECK: no obvious masses on inspection  LUNGS: clear to auscultation bilaterally, no wheezes, rales or rhonchi, good air movement  CV: HRRR, no peripheral edema  MS: moves all extremities without noticeable abnormality  PSYCH: pleasant and cooperative, no obvious depression or anxiety  ASSESSMENT AND PLAN:  Discussed the following assessment and plan:  Upper respiratory tract infection, unspecified type  -given HPI and exam findings today, a serious infection or illness is unlikely. We discussed potential etiologies, with VURI being most likely and possible developing bacterial sinusitis. She is very distressed by the cough that is keeping her up and reports tussinex is the only thing that has worked in the past. Aware of risk. Delated abx only if worsening or symptoms persist. We discussed treatment side effects, likely course, antibiotic misuse, transmission, and signs of developing a serious illness. -of course, we advised to return or notify a doctor immediately if symptoms worsen or persist or new  concerns arise.    Patient Instructions  INSTRUCTIONS FOR UPPER RESPIRATORY INFECTION:  -antibiotic if worsening, or if symptoms persist   -plenty of rest and fluids  -nasal saline wash 2-3 times daily (use prepackaged nasal saline or bottled/distilled water if making your own)   -can use AFRIN nasal  spray for drainage and nasal congestion - but do NOT use longer then 3-4 days  -in the winter time, using a humidifier at night is helpful (please follow cleaning instructions)  -if you are taking a cough medication - use only as directed, may also try a teaspoon of honey to coat the throat and throat lozenges. If given a cough medication with codeine or hydrocodone or other narcotic please be advised that this contains a strong and  potentially addicting medication. Please follow instructions carefully, take as little as possible and only use AS NEEDED for severe cough. Discuss potential side effects with your pharmacy. Please do not drive or operate machinery while taking these types of medications. Please do not take other sedating medications, drugs or alcohol while taking this medication without discussing with your doctor.  -for sore throat, salt water gargles can help  -follow up if you have fevers, facial pain, tooth pain, difficulty breathing or are worsening or symptoms persist longer then expected  Upper Respiratory Infection, Adult An upper respiratory infection (URI) is also known as the common cold. It is often caused by a type of germ (virus). Colds are easily spread (contagious). You can pass it to others by kissing, coughing, sneezing, or drinking out of the same glass. Usually, you get better in 1 to 3  weeks.  However, the cough can last for even longer. HOME CARE   Only take medicine as told by your doctor. Follow instructions provided above.  Drink enough water and fluids to keep your pee (urine) clear or pale yellow.  Get plenty of rest.  Return to work when your  temperature is < 100 for 24 hours or as told by your doctor. You may use a face mask and wash your hands to stop your cold from spreading. GET HELP RIGHT AWAY IF:   After the first few days, you feel you are getting worse.  You have questions about your medicine.  You have chills, shortness of breath, or red spit (mucus).  You have pain in the face for more then 1-2 days, especially when you bend forward.  You have a fever, puffy (swollen) neck, pain when you swallow, or white spots in the back of your throat.  You have a bad headache, ear pain, sinus pain, or chest pain.  You have a high-pitched whistling sound when you breathe in and out (wheezing).  You cough up blood.  You have sore muscles or a stiff neck. MAKE SURE YOU:   Understand these instructions.  Will watch your condition.  Will get help right away if you are not doing well or get worse. Document Released: 01/20/2008 Document Revised: 10/26/2011 Document Reviewed: 11/08/2013 [facility] Patient Information 2015 [place], [place]. This information is not intended to replace advice given to you by your health care provider. Make sure you discuss any questions you have with your health care provider.    Colin Benton R., DO

## 2016-08-03 NOTE — Progress Notes (Deleted)
Electrophysiology Office Note Date: 08/03/2016  ID:  Destiny Dennis, DOB 06/04/1961, MRN [id number]  PCP: Lucretia Kern., DO Primary Cardiologist: Stanford Breed Electrophysiologist: Allred CT Surgery: Roxy Manns  CC: Pacemaker follow-up  Destiny Dennis is a 55 y.o. female seen today for Dr Rayann Heman.  She presents today for routine electrophysiology followup. She recently underwent MAZE and ASD repair with Dr Roxy Manns. Post op, she had atrial fibrillation and recurrent post termination pauses of >6 seconds associated with syncope.  Prior to discharge, she underwent MDT CRTP pacemaker implantation.   Since last being seen, the patient reports doing relatively well.  She denies chest pain, palpitations, dyspnea, PND, orthopnea, vomiting, dizziness, syncope, edema, weight gain, or early satiety.  Device History: MDT CRTP implanted 2017 for tachy/brady syndrome, NICM   Past Medical History:  Diagnosis Date  . Allergic rhinitis   . Anxiety    when tachycardia occurs  . Arthritis    OA, s/p numerous surgeries -back, knees, hip  . Atrial flutter ([place])   . Atrial septal defect 01/01/2016   Discovered on TEE   . Benign fundic gland polyps of stomach   . Cervical disc disease   . Complication of anesthesia   . Diverticulitis    CT Scan   . DVT (deep venous thrombosis) ([place])    in pregnancy, s/p hip surgery  . Eustachian tube dysfunction   . GERD 12/23/2009   diet related-not a problem  . Headache(784.0)    migraines occ.  Marland Kitchen Hemorrhoids   . Hiatal hernia    small  . History of MRSA infection 2008   superficial skin-cleared easily with doxycycline  . Hypertension   . Obesity   . Peripheral vascular disease ([place])    left leg after hip surgery  . Persistent atrial fibrillation ([place]) 03/05/2007   a. s/p RFCA 12/18/2011, 07/29/12  . PONV (postoperative nausea and vomiting)   . s/p atrial septal defect repair 05/28/2016  . S/P Maze operation for atrial fibrillation 05/28/2016   Complete  bilateral atrial lesion set using cryothermy and bipolar radiofrequency ablation with clipping of LA appendage via median sternotomy  . Sialoadenitis   . SVT (supraventricular tachycardia) ([place]) 03/18/2010   PVCs  . Tachy-brady syndrome [facility])    Past Surgical History:  Procedure Laterality Date  . ASD REPAIR N/A 05/28/2016   Procedure: ATRIAL SEPTAL DEFECT (ASD) closure;  Surgeon: Rexene Alberts, MD;  Location: [place];  Service: Open Heart Surgery;  Laterality: N/A;  . ATRIAL FIBRILLATION ABLATION  07/29/2012   PVI by Dr Rayann Heman (second procedure)  . ATRIAL FIBRILLATION ABLATION N/A 12/18/2011   Procedure: ATRIAL FIBRILLATION ABLATION;  Surgeon: [name], MD;  Location: [facility] CATH LAB;  Service: Cardiovascular;  Laterality: N/A;  . ATRIAL FIBRILLATION ABLATION N/A 07/29/2012   Procedure: ATRIAL FIBRILLATION ABLATION;  Surgeon: [name], MD;  Location: [facility] CATH LAB;  Service: Cardiovascular;  Laterality: N/A;  . atrial fibrillation and atrial flutter ablation  12/17/11, 07/29/12   PVI and CTI ablations by Dr Rayann Heman  . CARDIAC CATHETERIZATION N/A 04/10/2016   Procedure: Right/Left Heart Cath and Coronary Angiography;  Surgeon: Jettie Booze, MD;  Location: [place] CV LAB;  Service: Cardiovascular;  Laterality: N/A;  . CERVICAL FUSION    . CESAREAN SECTION     x 2  . CHOLECYSTECTOMY    . CLIPPING OF ATRIAL APPENDAGE  05/28/2016   Procedure: CLIPPING OF ATRIAL APPENDAGE;  Surgeon: Rexene Alberts, MD;  Location: [place];  Service: Open Heart Surgery;;  . COLONOSCOPY  07/16/2011   diverticulosis  . ELBOW SURGERY     Right  . EP IMPLANTABLE DEVICE N/A 09/10/2015   Procedure: Loop Recorder Insertion;  Surgeon: [name], MD;  Location: [place] CV LAB;  Service: Cardiovascular;  Laterality: N/A;  . EP IMPLANTABLE DEVICE N/A 06/08/2016   Procedure: Pacemaker Implant;  Surgeon: Will Meredith Leeds, MD;  Location: [place] CV LAB;  Service: Cardiovascular;  Laterality: N/A;  .  FLEXIBLE SIGMOIDOSCOPY     1990's  . FOOT SURGERY     Right   . I&D KNEE WITH POLY EXCHANGE Left 12/12/2012   Procedure: LEFT KNEE EXCISION SAPHENOUS NEUROMA/OPEN SCAR DEBRIDEMENT/POLY EXCHANGE/NERVE EXCISION;  Surgeon: Mauri Pole, MD;  [facility];  Service: Orthopedics;  Laterality: Left;  . JOINT REPLACEMENT Bilateral    4'14  knees  . KNEE SURGERY     x 9 Left Knee  . MAZE  05/28/2016   Procedure: MAZE;  Surgeon: Rexene Alberts, MD;  Location: [place];  Service: Open Heart Surgery;;  . MEDIASTERNOTOMY N/A 05/28/2016   Procedure: MEDIAN STERNOTOMY;  Surgeon: Rexene Alberts, MD;  Location: [place];  Service: Open Heart Surgery;  Laterality: N/A;  . SHOULDER SURGERY     Right   . TEE WITHOUT CARDIOVERSION  12/17/2011   Procedure: TRANSESOPHAGEAL ECHOCARDIOGRAM (TEE);  Surgeon: Larey Dresser, MD;  Location: [facility] ENDOSCOPY;  Service: Cardiovascular;  Laterality: N/A;  . TEE WITHOUT CARDIOVERSION  07/28/2012   Procedure: TRANSESOPHAGEAL ECHOCARDIOGRAM (TEE);  Surgeon: Thayer Headings, MD;  Location: [place];  Service: Cardiovascular;  Laterality: N/A;  . TEE WITHOUT CARDIOVERSION N/A 01/01/2016   Procedure: TRANSESOPHAGEAL ECHOCARDIOGRAM (TEE);  Surgeon: Sanda Klein, MD;  Location: [facility] ENDOSCOPY;  Service: Cardiovascular;  Laterality: N/A;  . TEE WITHOUT CARDIOVERSION N/A 05/28/2016   Procedure: TRANSESOPHAGEAL ECHOCARDIOGRAM (TEE);  Surgeon: Rexene Alberts, MD;  Location: [place];  Service: Open Heart Surgery;  Laterality: N/A;  . TOTAL HIP ARTHROPLASTY Left 10/17/2013   Procedure: LEFT TOTAL HIP ARTHROPLASTY ANTERIOR APPROACH;  Surgeon: Mauri Pole, MD;  [facility];  Service: Orthopedics;  Laterality: Left;  . TOTAL KNEE ARTHROPLASTY Right 12/12/2012   Procedure: RIGHT TOTAL KNEE ARTHROPLASTY;  Surgeon: Mauri Pole, MD;  [facility];  Service: Orthopedics;  Laterality: Right;  . UPPER GASTROINTESTINAL ENDOSCOPY  01/30/2010   hiatal hernia, fundic gland polyps  .  WRIST SURGERY     Left     Current Outpatient Prescriptions  Medication Sig Dispense Refill  . acetaminophen (TYLENOL) 325 MG tablet Take 1-2 tablets (325-650 mg total) by mouth every 6 (six) hours as needed for mild pain or headache (For pain.).    Marland Kitchen amiodarone (PACERONE) 200 MG tablet Take 1 tablet (200 mg total) by mouth daily. 60 tablet 0  . amoxicillin (AMOXIL) 500 MG capsule Take 2,000 mg by mouth daily as needed. Take 1 hour prior to dental appointment.  4  . amoxicillin-clavulanate (AUGMENTIN) 875-125 MG tablet Take 1 tablet by mouth 2 (two) times daily. 20 tablet 0  . chlorpheniramine-HYDROcodone (TUSSIONEX PENNKINETIC ER) 10-8 MG/5ML SUER Take 5 mLs by mouth every 12 (twelve) hours as needed for cough. 114 mL 0  . diazepam (VALIUM) 5 MG tablet Take 5 mg by mouth at bedtime.     . diclofenac sodium (VOLTAREN) 1 % GEL Apply 2 g topically 2 (two) times daily as needed for pain.  1  . folic acid-pyridoxine-cyancobalamin (FOLTX) 2.5-25-2  MG TABS tablet Take 1 tablet by mouth daily. For one month then stop. 30 each 0  . ketotifen (ZADITOR) 0.025 % ophthalmic solution Place 1 drop into both eyes 2 (two) times daily as needed (allergies/ itching).    . metoprolol succinate (TOPROL-XL) 100 MG 24 hr tablet Take 1 tablet (100 mg total) by mouth daily. Take with or immediately following a meal. 30 tablet 1  . Omega-3 Fatty Acids (FISH OIL PO) Take 1 capsule by mouth daily.     . pantoprazole (PROTONIX) 40 MG tablet TAKE 1 TABLET (40 MG TOTAL) BY MOUTH DAILY AS NEEDED (HEARTBURN). 90 tablet 0  . rivaroxaban (XARELTO) 20 MG TABS tablet Take 1 tablet (20 mg total) by mouth at bedtime. 30 tablet 11   No current facility-administered medications for this visit.     Allergies:   Avelox [moxifloxacin hcl in nacl]   Social History: Social History   Social History  . Marital status: Married    Spouse name: N/A  . Number of children: 2  . Years of education: N/A   Occupational History  .  [employer]     Workers Comp   Social History Main Topics  . Smoking status: Never Smoker  . Smokeless tobacco: Never Used  . Alcohol use Yes     Comment: rare social  . Drug use: No  . Sexual activity: Yes     Comment: has periods q 3-4 months- no possiblity pregnant per pt.07-16-11   Other Topics Concern  . Not on file   Social History Narrative   Updated 06/2015   Daily Caffeine    Pt lives in [place] with spouse and two children 27 and 62 yo in 2-16.  She works as a workers Dance movement psychotherapist.   Christian   No regular exercise, diet ok    Family History: Family History  Problem Relation Age of Onset  . Diabetes Maternal Grandfather   . Diverticulitis Brother     x 2  . Diverticulitis Paternal Grandmother   . Colon cancer Neg Hx      Review of Systems: All other systems reviewed and are otherwise negative except as noted above.   Physical Exam: VS:  LMP 04/17/2012  , BMI There is no height or weight on file to calculate BMI.  GEN- The patient is well appearing, alert and oriented x 3 today.   HEENT: normocephalic, atraumatic; sclera clear, conjunctiva pink; hearing intact; oropharynx clear; neck supple  Lungs- Clear to ausculation bilaterally, normal work of breathing.  No wheezes, rales, rhonchi Heart- Regular rate and rhythm  GI- soft, non-tender, non-distended, bowel sounds present  Extremities- no clubbing, cyanosis, or edema  MS- no significant deformity or atrophy Skin- warm and dry, no rash or lesion; PPM pocket well healed Psych- euthymic mood, full affect Neuro- strength and sensation are intact  PPM Interrogation- reviewed in detail today,  See PACEART report  EKG:  EKG is not ordered today.  Recent Labs: 08/21/2015: TSH 2.217 05/26/2016: ALT 25 06/04/2016: Magnesium 1.9 06/24/2016: BUN 11; Creat 0.91; Hemoglobin 10.6; Platelets 458; Potassium 4.1; Sodium 140   Wt Readings from Last 3 Encounters:  07/20/16 267 lb (121.1 kg)  06/24/16  269 lb 6.4 oz (122.2 kg)  06/22/16 161 lb (73 kg)     Other studies Reviewed: Additional studies/ records that were reviewed today include: hospital records, Dr Guy Sandifer office notes  Assessment and Plan:  1.  Tachy/brady syndrome Normal PPM function See Claudia Desanctis Art report  No changes today PPM incision well healed  2.  Persistent atrial fibrillation Burden by device interrogation today 0% Continue Xarelto for CHADS2VASC of 2  Will discontinue amiodarone at this point and follow burden over time  3.  LV dysfunction Euvolemic on exam Continue current therapy Update echo 11/2016    Current medicines are reviewed at length with the patient today.   The patient does not have concerns regarding her medicines.  The following changes were made today:  Stop amiodarone   Labs/ tests ordered today include: echo 11/2016  No orders of the defined types were placed in this encounter.   Disposition:   Follow up with Dr Rayann Heman 6 weeks   Signed, Chanetta Marshall, NP 08/03/2016 4:06 PM  [place] [address] [phone] (office) [phone] (fax

## 2016-08-05 ENCOUNTER — Ambulatory Visit: Payer: Managed Care, Other (non HMO) | Admitting: Nurse Practitioner

## 2016-08-19 NOTE — Progress Notes (Signed)
Electrophysiology Office Note Date: 08/21/2016  ID:  BERLINDA PARRILL, DOB 1960-11-12, MRN [id number]  PCP: Lucretia Kern., DO Primary Cardiologist: Stanford Breed Electrophysiologist: Allred CT Surgery: Roxy Manns  CC: Pacemaker follow-up  Destiny Dennis is a 56 y.o. female seen today for Dr Rayann Heman.  She presents today for routine electrophysiology followup. She underwent MAZE and ASD repair with Dr Roxy Manns 05/2016. Post op, she had atrial fibrillation and recurrent post termination pauses of >6 seconds associated with syncope.  Prior to discharge, she underwent MDT CRTP pacemaker implantation.   Since last being seen in clinic, the patient reports doing relatively well until yesterday.  She has had palpitations since then that are different from her atrial fibrillation.  She denies chest pain, palpitations, PND, orthopnea, vomiting, dizziness, syncope, edema, weight gain, or early satiety.  Device History: MDT CRTP implanted 2017 for tachy/brady syndrome, NICM   Past Medical History:  Diagnosis Date  . Allergic rhinitis   . Anxiety    when tachycardia occurs  . Arthritis    OA, s/p numerous surgeries -back, knees, hip  . Atrial flutter ([place])   . Atrial septal defect 01/01/2016   Discovered on TEE   . Benign fundic gland polyps of stomach   . Cervical disc disease   . Complication of anesthesia   . Diverticulitis    CT Scan   . DVT (deep venous thrombosis) ([place])    in pregnancy, s/p hip surgery  . Eustachian tube dysfunction   . GERD 12/23/2009   diet related-not a problem  . Headache(784.0)    migraines occ.  Marland Kitchen Hemorrhoids   . Hiatal hernia    small  . History of MRSA infection 2008   superficial skin-cleared easily with doxycycline  . Hypertension   . Obesity   . Peripheral vascular disease ([place])    left leg after hip surgery  . Persistent atrial fibrillation ([place]) 03/05/2007   a. s/p RFCA 12/18/2011, 07/29/12  . PONV (postoperative nausea and vomiting)   . s/p atrial septal  defect repair 05/28/2016  . S/P Maze operation for atrial fibrillation 05/28/2016   Complete bilateral atrial lesion set using cryothermy and bipolar radiofrequency ablation with clipping of LA appendage via median sternotomy  . Sialoadenitis   . SVT (supraventricular tachycardia) ([place]) 03/18/2010   PVCs  . Tachy-brady syndrome [facility])    Past Surgical History:  Procedure Laterality Date  . ASD REPAIR N/A 05/28/2016   Procedure: ATRIAL SEPTAL DEFECT (ASD) closure;  Surgeon: Rexene Alberts, MD;  Location: [place];  Service: Open Heart Surgery;  Laterality: N/A;  . ATRIAL FIBRILLATION ABLATION  07/29/2012   PVI by Dr Rayann Heman (second procedure)  . ATRIAL FIBRILLATION ABLATION N/A 12/18/2011   Procedure: ATRIAL FIBRILLATION ABLATION;  Surgeon: [name], MD;  Location: [facility] CATH LAB;  Service: Cardiovascular;  Laterality: N/A;  . ATRIAL FIBRILLATION ABLATION N/A 07/29/2012   Procedure: ATRIAL FIBRILLATION ABLATION;  Surgeon: [name], MD;  Location: [facility] CATH LAB;  Service: Cardiovascular;  Laterality: N/A;  . atrial fibrillation and atrial flutter ablation  12/17/11, 07/29/12   PVI and CTI ablations by Dr Rayann Heman  . CARDIAC CATHETERIZATION N/A 04/10/2016   Procedure: Right/Left Heart Cath and Coronary Angiography;  Surgeon: Jettie Booze, MD;  Location: [place] CV LAB;  Service: Cardiovascular;  Laterality: N/A;  . CERVICAL FUSION    . CESAREAN SECTION     x 2  . CHOLECYSTECTOMY    . CLIPPING OF ATRIAL APPENDAGE  05/28/2016  Procedure: CLIPPING OF ATRIAL APPENDAGE;  Surgeon: Rexene Alberts, MD;  Location: [place];  Service: Open Heart Surgery;;  . COLONOSCOPY  07/16/2011   diverticulosis  . ELBOW SURGERY     Right  . EP IMPLANTABLE DEVICE N/A 09/10/2015   Procedure: Loop Recorder Insertion;  Surgeon: [name], MD;  Location: [place] CV LAB;  Service: Cardiovascular;  Laterality: N/A;  . EP IMPLANTABLE DEVICE N/A 06/08/2016   Procedure: Pacemaker Implant;  Surgeon: Will Meredith Leeds, MD;  Location: [place] CV LAB;  Service: Cardiovascular;  Laterality: N/A;  . FLEXIBLE SIGMOIDOSCOPY     1990's  . FOOT SURGERY     Right   . I&D KNEE WITH POLY EXCHANGE Left 12/12/2012   Procedure: LEFT KNEE EXCISION SAPHENOUS NEUROMA/OPEN SCAR DEBRIDEMENT/POLY EXCHANGE/NERVE EXCISION;  Surgeon: Mauri Pole, MD;  Location: [facility];  Service: Orthopedics;  Laterality: Left;  . JOINT REPLACEMENT Bilateral    4'14  knees  . KNEE SURGERY     x 9 Left Knee  . MAZE  05/28/2016   Procedure: MAZE;  Surgeon: Rexene Alberts, MD;  Location: [place];  Service: Open Heart Surgery;;  . MEDIASTERNOTOMY N/A 05/28/2016   Procedure: MEDIAN STERNOTOMY;  Surgeon: Rexene Alberts, MD;  Location: [place];  Service: Open Heart Surgery;  Laterality: N/A;  . SHOULDER SURGERY     Right   . TEE WITHOUT CARDIOVERSION  12/17/2011   Procedure: TRANSESOPHAGEAL ECHOCARDIOGRAM (TEE);  Surgeon: Larey Dresser, MD;  Location: [facility] ENDOSCOPY;  Service: Cardiovascular;  Laterality: N/A;  . TEE WITHOUT CARDIOVERSION  07/28/2012   Procedure: TRANSESOPHAGEAL ECHOCARDIOGRAM (TEE);  Surgeon: Thayer Headings, MD;  Location: [place];  Service: Cardiovascular;  Laterality: N/A;  . TEE WITHOUT CARDIOVERSION N/A 01/01/2016   Procedure: TRANSESOPHAGEAL ECHOCARDIOGRAM (TEE);  Surgeon: Sanda Klein, MD;  Location: [facility] ENDOSCOPY;  Service: Cardiovascular;  Laterality: N/A;  . TEE WITHOUT CARDIOVERSION N/A 05/28/2016   Procedure: TRANSESOPHAGEAL ECHOCARDIOGRAM (TEE);  Surgeon: Rexene Alberts, MD;  Location: [place];  Service: Open Heart Surgery;  Laterality: N/A;  . TOTAL HIP ARTHROPLASTY Left 10/17/2013   Procedure: LEFT TOTAL HIP ARTHROPLASTY ANTERIOR APPROACH;  Surgeon: Mauri Pole, MD;  Location: [facility];  Service: Orthopedics;  Laterality: Left;  . TOTAL KNEE ARTHROPLASTY Right 12/12/2012   Procedure: RIGHT TOTAL KNEE ARTHROPLASTY;  Surgeon: Mauri Pole, MD;  Location: [facility];  Service: Orthopedics;  Laterality: Right;    . UPPER GASTROINTESTINAL ENDOSCOPY  01/30/2010   hiatal hernia, fundic gland polyps  . WRIST SURGERY     Left     Current Outpatient Prescriptions  Medication Sig Dispense Refill  . acetaminophen (TYLENOL) 325 MG tablet Take 1-2 tablets (325-650 mg total) by mouth every 6 (six) hours as needed for mild pain or headache (For pain.).    Marland Kitchen amiodarone (PACERONE) 200 MG tablet Take 1 tablet (200 mg total) by mouth daily. 60 tablet 0  . amoxicillin (AMOXIL) 500 MG capsule Take 2,000 mg by mouth daily as needed. Take 1 hour prior to dental appointment.  4  . amoxicillin-clavulanate (AUGMENTIN) 875-125 MG tablet Take 1 tablet by mouth 2 (two) times daily. 20 tablet 0  . chlorpheniramine-HYDROcodone (TUSSIONEX PENNKINETIC ER) 10-8 MG/5ML SUER Take 5 mLs by mouth every 12 (twelve) hours as needed for cough. 114 mL 0  . diazepam (VALIUM) 5 MG tablet Take 5 mg by mouth at bedtime.     . diclofenac sodium (VOLTAREN) 1 % GEL Apply 2 g  topically 2 (two) times daily as needed for pain.  1  . folic acid-pyridoxine-cyancobalamin (FOLTX) 2.5-25-2 MG TABS tablet Take 1 tablet by mouth daily. For one month then stop. 30 each 0  . ketotifen (ZADITOR) 0.025 % ophthalmic solution Place 1 drop into both eyes 2 (two) times daily as needed (allergies/ itching).    . metoprolol succinate (TOPROL-XL) 100 MG 24 hr tablet Take 1 tablet (100 mg total) by mouth daily. Take with or immediately following a meal. 30 tablet 1  . Omega-3 Fatty Acids (FISH OIL PO) Take 1 capsule by mouth daily.     . pantoprazole (PROTONIX) 40 MG tablet TAKE 1 TABLET (40 MG TOTAL) BY MOUTH DAILY AS NEEDED (HEARTBURN). 90 tablet 0  . rivaroxaban (XARELTO) 20 MG TABS tablet Take 1 tablet (20 mg total) by mouth at bedtime. 30 tablet 11   No current facility-administered medications for this visit.     Allergies:   Avelox [moxifloxacin hcl in nacl]   Social History: Social History   Social History  . Marital status: Married    Spouse name:  N/A  . Number of children: 2  . Years of education: N/A   Occupational History  . [employer]     Workers Comp   Social History Main Topics  . Smoking status: Never Smoker  . Smokeless tobacco: Never Used  . Alcohol use Yes     Comment: rare social  . Drug use: No  . Sexual activity: Yes     Comment: has periods q 3-4 months- no possiblity pregnant per pt.07-16-11   Other Topics Concern  . Not on file   Social History Narrative   Updated 06/2015   Daily Caffeine    Pt lives in [place] with spouse and two children 64 and 30 yo in 2-16.  She works as a workers Dance movement psychotherapist.   Christian   No regular exercise, diet ok    Family History: Family History  Problem Relation Age of Onset  . Diabetes Maternal Grandfather   . Diverticulitis Brother     x 2  . Diverticulitis Paternal Grandmother   . Colon cancer Neg Hx      Review of Systems: All other systems reviewed and are otherwise negative except as noted above.   Physical Exam: VS:  BP (!) 144/80   Pulse 86   Ht 5\' 11"  (1.803 m)   Wt 270 lb (122.5 kg)   LMP 04/17/2012   BMI 37.66 kg/m  , BMI Body mass index is 37.66 kg/m.  GEN- The patient is well appearing, alert and oriented x 3 today.   HEENT: normocephalic, atraumatic; sclera clear, conjunctiva pink; hearing intact; oropharynx clear; neck supple  Lungs- Clear to ausculation bilaterally, normal work of breathing.  No wheezes, rales, rhonchi Heart- Regular rate and rhythm  GI- soft, non-tender, non-distended, bowel sounds present  Extremities- no clubbing, cyanosis, or edema  MS- no significant deformity or atrophy Skin- warm and dry, no rash or lesion; PPM pocket well healed Psych- euthymic mood, full affect Neuro- strength and sensation are intact  PPM Interrogation- reviewed in detail today,  See PACEART report  EKG:  EKG is not ordered today.  Recent Labs: 05/26/2016: ALT 25 06/04/2016: Magnesium 1.9 06/24/2016: BUN 11; Creat 0.91;  Hemoglobin 10.6; Platelets 458; Potassium 4.1; Sodium 140   Wt Readings from Last 3 Encounters:  08/21/16 270 lb (122.5 kg)  07/20/16 267 lb (121.1 kg)  06/24/16 269 lb 6.4 oz (122.2 kg)  Other studies Reviewed: Additional studies/ records that were reviewed today include: hospital records, Dr Guy Sandifer office notes  Assessment and Plan:  1.  Tachy/brady syndrome Normal PPM function See Pace Art report No changes today  2.  Persistent atrial fibrillation Burden by device interrogation today 0% Continue Xarelto for CHADS2VASC of 2 Continue low dose amiodarone for another 6-8 weeks per Dr Roxy Manns  3.  LV dysfunction Euvolemic on exam Continue current therapy Update echo in 12/2016 (6 months post CRT)  4.  PVC's Symptomatic Increase Toprol to 100mg  qam and 50mg  qpm today She has had increased life stressors recently with her mom fracturing her hip   Current medicines are reviewed at length with the patient today.   The patient does not have concerns regarding her medicines.  The following changes were made today:  none  Labs/ tests ordered today include: echo 12/2016  No orders of the defined types were placed in this encounter.   Disposition:   Follow up with Dr Rayann Heman 6 weeks   Signed, Chanetta Marshall, NP 08/21/2016 10:18 AM  [place] [address] [phone] (office) [phone] (fax

## 2016-08-21 ENCOUNTER — Encounter: Payer: Managed Care, Other (non HMO) | Admitting: Thoracic Surgery (Cardiothoracic Vascular Surgery)

## 2016-08-21 ENCOUNTER — Ambulatory Visit (INDEPENDENT_AMBULATORY_CARE_PROVIDER_SITE_OTHER): Payer: Managed Care, Other (non HMO) | Admitting: Nurse Practitioner

## 2016-08-21 VITALS — BP 144/80 | HR 86 | Ht 71.0 in | Wt 270.0 lb

## 2016-08-21 DIAGNOSIS — I5022 Chronic systolic (congestive) heart failure: Secondary | ICD-10-CM | POA: Diagnosis not present

## 2016-08-21 DIAGNOSIS — I4819 Other persistent atrial fibrillation: Secondary | ICD-10-CM

## 2016-08-21 DIAGNOSIS — I481 Persistent atrial fibrillation: Secondary | ICD-10-CM | POA: Diagnosis not present

## 2016-08-21 DIAGNOSIS — I493 Ventricular premature depolarization: Secondary | ICD-10-CM | POA: Diagnosis not present

## 2016-08-21 DIAGNOSIS — I519 Heart disease, unspecified: Secondary | ICD-10-CM

## 2016-08-21 MED ORDER — METOPROLOL SUCCINATE ER 100 MG PO TB24: ORAL_TABLET | ORAL | 3 refills | Status: DC

## 2016-08-21 NOTE — Patient Instructions (Addendum)
Medication Instructions:   START TAKING METOPROLOL 100 MG IN THE AM AND 50 MG IN THE PM   If you need a refill on your cardiac medications before your next appointment, please call your pharmacy.  Labwork: NONE ORDERED  TODAY    Testing/Procedures: NONE ORDERED  TODAY    Follow-Up: IN 6 WEEKS WITH DR ALLRED    Any Other Special Instructions Will Be Listed Below (If Applicable).

## 2016-08-24 ENCOUNTER — Encounter: Payer: Managed Care, Other (non HMO) | Admitting: Thoracic Surgery (Cardiothoracic Vascular Surgery)

## 2016-08-25 ENCOUNTER — Other Ambulatory Visit: Payer: Self-pay | Admitting: Internal Medicine

## 2016-09-06 ENCOUNTER — Other Ambulatory Visit: Payer: Self-pay | Admitting: Family Medicine

## 2016-09-21 ENCOUNTER — Ambulatory Visit (INDEPENDENT_AMBULATORY_CARE_PROVIDER_SITE_OTHER): Payer: Managed Care, Other (non HMO) | Admitting: Thoracic Surgery (Cardiothoracic Vascular Surgery)

## 2016-09-21 ENCOUNTER — Encounter: Payer: Self-pay | Admitting: Thoracic Surgery (Cardiothoracic Vascular Surgery)

## 2016-09-21 VITALS — BP 139/83 | HR 85 | Resp 20 | Ht 71.0 in | Wt 270.0 lb

## 2016-09-21 DIAGNOSIS — Z9889 Other specified postprocedural states: Secondary | ICD-10-CM | POA: Diagnosis not present

## 2016-09-21 DIAGNOSIS — Z8679 Personal history of other diseases of the circulatory system: Secondary | ICD-10-CM

## 2016-09-21 DIAGNOSIS — Z8774 Personal history of (corrected) congenital malformations of heart and circulatory system: Secondary | ICD-10-CM | POA: Diagnosis not present

## 2016-09-21 DIAGNOSIS — I481 Persistent atrial fibrillation: Secondary | ICD-10-CM | POA: Diagnosis not present

## 2016-09-21 DIAGNOSIS — Q211 Atrial septal defect, unspecified: Secondary | ICD-10-CM

## 2016-09-21 DIAGNOSIS — I4819 Other persistent atrial fibrillation: Secondary | ICD-10-CM

## 2016-09-21 NOTE — Progress Notes (Signed)
[address]       [place] [address]             [phone]     CARDIOTHORACIC SURGERY OFFICE NOTE  Referring Provider is [name], MD PCP is Lucretia Kern., DO   HPI:  Patient is a 56 year old obese white female with multiple medical problems including nonischemic cardiomyopathy with chronic combined systolic and diastolic congestive heart failure and recurrent persistent atrial fibrillation that had failed catheter-based ablation and medical therapy with multiple drugs who returns to the office today for routine follow-up status post Maze procedure with closure of small atrial septal defect on 05/28/2016.  The patient's early postoperative recovery in the hospital was notable for the development of tachybrady syndrome with recurrent episodes of rapid atrial fibrillation interspersed with slow junctional escape rhythm and occasional symptomatic pauses. She ultimately underwent implantation of permanent pacemaker with biventricular pacing capability by Dr. Curt Bears on 06/08/2016.   She was last seen here in our office on 06/22/2016 which time she was doing well.  She was seen in follow-up in the atrial fibrillation clinic by Chanetta Marshall on 08/21/2016. At that time she was complaining of palpitations and interrogation of her pacemaker revealed no recurrence of atrial fibrillation but increased frequency of occasional premature ventricular contractions. Her dose of beta blocker was increased.  She returns to our office today and reports that she is now doing very well. She states that she did come down with the upper respiratory tract infection approximately one month ago, and this set her back in terms of her physical recovery and progress with exercise. She has finally recovered and her cough has resolved.  She feels confident that she continues to maintain her heart rhythm and she has not had any symptoms to suggest a recurrence of atrial fibrillation or atrial flutter.  She is quite pleased that she no longer has episodes of palpitations. Exercise tolerance has lagged behind and she has not been exercising regularly and she has not been successful in losing any weight.  She is hopeful that now that she is finally feeling better she will be able to get back into a pattern including regular exercise. She is making adjustments to her diet and trying to lose weight.  She no longer has any significant pain in her chest related to her sternotomy, although she states that her pacemaker is still a little bit uncomfortable.  Current Outpatient Prescriptions  Medication Sig Dispense Refill  . acetaminophen (TYLENOL) 325 MG tablet Take 1-2 tablets (325-650 mg total) by mouth every 6 (six) hours as needed for mild pain or headache (For pain.).    Marland Kitchen amiodarone (PACERONE) 200 MG tablet Take 1 tablet (200 mg total) by mouth daily. 60 tablet 0  . amoxicillin (AMOXIL) 500 MG capsule Take 2,000 mg by mouth daily as needed. Take 1 hour prior to dental appointment.  4  . diazepam (VALIUM) 5 MG tablet Take 5 mg by mouth at bedtime.     . metoprolol succinate (TOPROL-XL) 100 MG 24 hr tablet TAKE ONE TABLET IN AM AND HALF A TABLET IN PM DALY 45 tablet 3  . Omega-3 Fatty Acids (FISH OIL PO) Take 1 capsule by mouth daily.     . pantoprazole (PROTONIX) 40 MG tablet TAKE 1 TABLET (40 MG TOTAL) BY MOUTH DAILY AS NEEDED (HEARTBURN). 90 tablet 1  . rivaroxaban (XARELTO) 20 MG TABS tablet Take 1 tablet (20 mg total) by mouth at bedtime.  30 tablet 11   No current facility-administered medications for this visit.       Physical Exam:   BP 139/83   Pulse 85   Resp 20   Ht 5\' 11"  (1.803 m)   Wt 270 lb (122.5 kg)   LMP 04/17/2012   SpO2 98% Comment: RA  BMI 37.66 kg/m   General:  Morbidly obese but well appearing  Chest:   Clear to auscultation  CV:   Regular rate and rhythm without murmur  Incisions:  Well-healed, sternum is stable  Abdomen:  Soft nontender  Extremities:  Warm and  well-perfused  Diagnostic Tests:  2 channel telemetry rhythm strip demonstrates paced rhythm   Impression:  Patient is doing well and maintaining stable atrial paced rhythm approximately 4 months status post Maze procedure and closure of atrial septal defect.  She remains on amiodarone and she is chronically anticoagulated using Xarelto.  Plan:  I have instructed the patient to cut her dose of amiodarone back to 100 mg daily and stop taking amiodarone entirely when her current prescription runs out. We have not recommended any other changes to her current medications.  She plans to follow-up with Dr. Rayann Heman and Roderic Palau in the atrial fibrillation clinic within the next month or 2. We will plan to have her return to our office for routine follow-up next October, approximately 1 year following her original surgery. All of her questions been addressed.  I spent in excess of 15 minutes during the conduct of this office consultation and >50% of this time involved direct face-to-face encounter with the patient for counseling and/or coordination of their care.   Valentina Gu. Roxy Manns, MD 09/21/2016 4:51 PM

## 2016-09-21 NOTE — Patient Instructions (Addendum)
Decrease your dose of Amiodarone to one 100 mg tablet daily until your current prescription runs out, then stop taking it altogether.  Continue all other previous medications without any changes at this time

## 2016-09-28 LAB — HM PAP SMEAR

## 2016-10-09 ENCOUNTER — Ambulatory Visit (INDEPENDENT_AMBULATORY_CARE_PROVIDER_SITE_OTHER): Payer: Managed Care, Other (non HMO) | Admitting: Internal Medicine

## 2016-10-09 ENCOUNTER — Encounter: Payer: Self-pay | Admitting: Internal Medicine

## 2016-10-09 VITALS — BP 129/78 | HR 88 | Ht 71.0 in | Wt 280.1 lb

## 2016-10-09 DIAGNOSIS — Z950 Presence of cardiac pacemaker: Secondary | ICD-10-CM | POA: Diagnosis not present

## 2016-10-09 DIAGNOSIS — I481 Persistent atrial fibrillation: Secondary | ICD-10-CM

## 2016-10-09 DIAGNOSIS — I495 Sick sinus syndrome: Secondary | ICD-10-CM | POA: Diagnosis not present

## 2016-10-09 DIAGNOSIS — I519 Heart disease, unspecified: Secondary | ICD-10-CM | POA: Diagnosis not present

## 2016-10-09 DIAGNOSIS — I4819 Other persistent atrial fibrillation: Secondary | ICD-10-CM

## 2016-10-09 DIAGNOSIS — I5022 Chronic systolic (congestive) heart failure: Secondary | ICD-10-CM | POA: Diagnosis not present

## 2016-10-09 LAB — CUP PACEART INCLINIC DEVICE CHECK
Brady Statistic AP VP Percent: 98.35 %
Brady Statistic AP VS Percent: 1.52 %
Brady Statistic AS VP Percent: 0.05 %
Brady Statistic AS VS Percent: 0.08 %
Brady Statistic RV Percent Paced: 58.91 %
Date Time Interrogation Session: 20180223153445
Implantable Lead Implant Date: 20171023
Implantable Lead Location: 753859
Implantable Lead Location: 753860
Implantable Lead Model: 5076
Implantable Lead Model: 5076
Lead Channel Impedance Value: 1064 Ohm
Lead Channel Impedance Value: 532 Ohm
Lead Channel Impedance Value: 570 Ohm
Lead Channel Impedance Value: 627 Ohm
Lead Channel Impedance Value: 665 Ohm
Lead Channel Impedance Value: 988 Ohm
Lead Channel Pacing Threshold Amplitude: 0.5 V
Lead Channel Pacing Threshold Amplitude: 0.75 V
Lead Channel Pacing Threshold Amplitude: 1 V
Lead Channel Sensing Intrinsic Amplitude: 2.4 mV
Lead Channel Setting Pacing Amplitude: 2 V
Lead Channel Setting Pacing Amplitude: 2.5 V
Lead Channel Setting Pacing Pulse Width: 0.4 ms
MDC IDC LEAD IMPLANT DT: 20171023
MDC IDC LEAD IMPLANT DT: 20171023
MDC IDC LEAD LOCATION: 753858
MDC IDC MSMT BATTERY REMAINING LONGEVITY: 123 mo
MDC IDC MSMT BATTERY VOLTAGE: 3.11 V
MDC IDC MSMT LEADCHNL LV IMPEDANCE VALUE: 627 Ohm
MDC IDC MSMT LEADCHNL LV IMPEDANCE VALUE: 741 Ohm
MDC IDC MSMT LEADCHNL LV PACING THRESHOLD PULSEWIDTH: 0.4 ms
MDC IDC MSMT LEADCHNL RA IMPEDANCE VALUE: 304 Ohm
MDC IDC MSMT LEADCHNL RA IMPEDANCE VALUE: 456 Ohm
MDC IDC MSMT LEADCHNL RA PACING THRESHOLD PULSEWIDTH: 0.4 ms
MDC IDC MSMT LEADCHNL RV PACING THRESHOLD PULSEWIDTH: 0.4 ms
MDC IDC MSMT LEADCHNL RV SENSING INTR AMPL: 16 mV
MDC IDC PG IMPLANT DT: 20171023
MDC IDC SET LEADCHNL LV PACING PULSEWIDTH: 0.4 ms
MDC IDC SET LEADCHNL RA PACING AMPLITUDE: 2 V
MDC IDC SET LEADCHNL RV SENSING SENSITIVITY: 2 mV
MDC IDC STAT BRADY RA PERCENT PACED: 99.87 %

## 2016-10-09 NOTE — Patient Instructions (Signed)
Medication Instructions:  Your physician has recommended you make the following change in your medication:  1) Stop Amiodarone 10/16/16 .   Labwork: None ordered   Testing/Procedures: None ordered   Follow-Up: Your physician recommends that you schedule a follow-up appointment in: 3 months with Dr Rayann Heman   Any Other Special Instructions Will Be Listed Below (If Applicable).     If you need a refill on your cardiac medications before your next appointment, please call your pharmacy.

## 2016-10-09 NOTE — Progress Notes (Signed)
PCP: Lucretia Kern., DO Primary Cardiologist:  Dr Clide Dales is a 56 y.o. female who presents today for routine electrophysiology followup.  Af has resolved s/p MAZE.  She has had difficulty with weight loss.  She is trying to exercise.  Still has positional chest discomfort and therefore sleeps in a recliner.  Today, she denies symptoms of chest pain, shortness of breath,  lower extremity edema, dizziness, presyncope, or syncope.  The patient is otherwise without complaint today.   Past Medical History:  Diagnosis Date  . Allergic rhinitis   . Anxiety    when tachycardia occurs  . Arthritis    OA, s/p numerous surgeries -back, knees, hip  . Atrial flutter ([place])   . Atrial septal defect 01/01/2016   Discovered on TEE   . Benign fundic gland polyps of stomach   . Cervical disc disease   . Complication of anesthesia   . Diverticulitis    CT Scan   . DVT (deep venous thrombosis) ([place])    in pregnancy, s/p hip surgery  . Eustachian tube dysfunction   . GERD 12/23/2009   diet related-not a problem  . Headache(784.0)    migraines occ.  Marland Kitchen Hemorrhoids   . Hiatal hernia    small  . History of MRSA infection 2008   superficial skin-cleared easily with doxycycline  . Hypertension   . Obesity   . Peripheral vascular disease ([place])    left leg after hip surgery  . Persistent atrial fibrillation ([place]) 03/05/2007   a. s/p RFCA 12/18/2011, 07/29/12  . PONV (postoperative nausea and vomiting)   . s/p atrial septal defect repair 05/28/2016  . S/P Maze operation for atrial fibrillation 05/28/2016   Complete bilateral atrial lesion set using cryothermy and bipolar radiofrequency ablation with clipping of LA appendage via median sternotomy  . Sialoadenitis   . SVT (supraventricular tachycardia) ([place]) 03/18/2010   PVCs  . Tachy-brady syndrome [facility])    Past Surgical History:  Procedure Laterality Date  . ASD REPAIR N/A 05/28/2016   Procedure: ATRIAL SEPTAL DEFECT (ASD) closure;   Surgeon: Rexene Alberts, MD;  Location: [place];  Service: Open Heart Surgery;  Laterality: N/A;  . ATRIAL FIBRILLATION ABLATION  07/29/2012   PVI by Dr Rayann Heman (second procedure)  . ATRIAL FIBRILLATION ABLATION N/A 12/18/2011   Procedure: ATRIAL FIBRILLATION ABLATION;  Surgeon: [name], MD;  Location: [facility] CATH LAB;  Service: Cardiovascular;  Laterality: N/A;  . ATRIAL FIBRILLATION ABLATION N/A 07/29/2012   Procedure: ATRIAL FIBRILLATION ABLATION;  Surgeon: [name], MD;  Location: [facility] CATH LAB;  Service: Cardiovascular;  Laterality: N/A;  . atrial fibrillation and atrial flutter ablation  12/17/11, 07/29/12   PVI and CTI ablations by Dr Rayann Heman  . CARDIAC CATHETERIZATION N/A 04/10/2016   Procedure: Right/Left Heart Cath and Coronary Angiography;  Surgeon: Jettie Booze, MD;  Location: [place] CV LAB;  Service: Cardiovascular;  Laterality: N/A;  . CERVICAL FUSION    . CESAREAN SECTION     x 2  . CHOLECYSTECTOMY    . CLIPPING OF ATRIAL APPENDAGE  05/28/2016   Procedure: CLIPPING OF ATRIAL APPENDAGE;  Surgeon: Rexene Alberts, MD;  Location: [place];  Service: Open Heart Surgery;;  . COLONOSCOPY  07/16/2011   diverticulosis  . ELBOW SURGERY     Right  . EP IMPLANTABLE DEVICE N/A 09/10/2015   Procedure: Loop Recorder Insertion;  Surgeon: [name], MD;  Location: [place] CV LAB;  Service: Cardiovascular;  Laterality:  N/A;  . EP IMPLANTABLE DEVICE N/A 06/08/2016   Procedure: Pacemaker Implant;  Surgeon: Will Meredith Leeds, MD;  Location: [place] CV LAB;  Service: Cardiovascular;  Laterality: N/A;  . FLEXIBLE SIGMOIDOSCOPY     1990's  . FOOT SURGERY     Right   . I&D KNEE WITH POLY EXCHANGE Left 12/12/2012   Procedure: LEFT KNEE EXCISION SAPHENOUS NEUROMA/OPEN SCAR DEBRIDEMENT/POLY EXCHANGE/NERVE EXCISION;  Surgeon: Mauri Pole, MD;  [facility];  Service: Orthopedics;  Laterality: Left;  . JOINT REPLACEMENT Bilateral    4'14  knees  . KNEE SURGERY     x 9 Left  Knee  . MAZE  05/28/2016   Procedure: MAZE;  Surgeon: Rexene Alberts, MD;  Location: [place];  Service: Open Heart Surgery;;  . MEDIASTERNOTOMY N/A 05/28/2016   Procedure: MEDIAN STERNOTOMY;  Surgeon: Rexene Alberts, MD;  Location: [place];  Service: Open Heart Surgery;  Laterality: N/A;  . SHOULDER SURGERY     Right   . TEE WITHOUT CARDIOVERSION  12/17/2011   Procedure: TRANSESOPHAGEAL ECHOCARDIOGRAM (TEE);  Surgeon: Larey Dresser, MD;  Location: [facility] ENDOSCOPY;  Service: Cardiovascular;  Laterality: N/A;  . TEE WITHOUT CARDIOVERSION  07/28/2012   Procedure: TRANSESOPHAGEAL ECHOCARDIOGRAM (TEE);  Surgeon: Thayer Headings, MD;  Location: [place];  Service: Cardiovascular;  Laterality: N/A;  . TEE WITHOUT CARDIOVERSION N/A 01/01/2016   Procedure: TRANSESOPHAGEAL ECHOCARDIOGRAM (TEE);  Surgeon: Sanda Klein, MD;  Location: [facility] ENDOSCOPY;  Service: Cardiovascular;  Laterality: N/A;  . TEE WITHOUT CARDIOVERSION N/A 05/28/2016   Procedure: TRANSESOPHAGEAL ECHOCARDIOGRAM (TEE);  Surgeon: Rexene Alberts, MD;  Location: [place];  Service: Open Heart Surgery;  Laterality: N/A;  . TOTAL HIP ARTHROPLASTY Left 10/17/2013   Procedure: LEFT TOTAL HIP ARTHROPLASTY ANTERIOR APPROACH;  Surgeon: Mauri Pole, MD;  [facility];  Service: Orthopedics;  Laterality: Left;  . TOTAL KNEE ARTHROPLASTY Right 12/12/2012   Procedure: RIGHT TOTAL KNEE ARTHROPLASTY;  Surgeon: Mauri Pole, MD;  [facility];  Service: Orthopedics;  Laterality: Right;  . UPPER GASTROINTESTINAL ENDOSCOPY  01/30/2010   hiatal hernia, fundic gland polyps  . WRIST SURGERY     Left     ROS-L ankle pain limits activity, all other systems reviewed and negative except as per above  Current Outpatient Prescriptions  Medication Sig Dispense Refill  . acetaminophen (TYLENOL) 325 MG tablet Take 1-2 tablets (325-650 mg total) by mouth every 6 (six) hours as needed for mild pain or headache (For pain.).    Marland Kitchen amiodarone (PACERONE) 200 MG  tablet Take 100 mg by mouth daily.    Marland Kitchen amoxicillin (AMOXIL) 500 MG capsule Take 2,000 mg by mouth daily as needed. Take 1 hour prior to dental appointment.  4  . diazepam (VALIUM) 5 MG tablet Take 5 mg by mouth every 6 (six) hours as needed for muscle spasms.     . metoprolol succinate (TOPROL-XL) 100 MG 24 hr tablet TAKE ONE TABLET IN AM AND HALF A TABLET IN PM DALY 45 tablet 3  . Omega-3 Fatty Acids (FISH OIL PO) Take 1 capsule by mouth daily.     . pantoprazole (PROTONIX) 40 MG tablet TAKE 1 TABLET (40 MG TOTAL) BY MOUTH DAILY AS NEEDED (HEARTBURN). 90 tablet 1  . rivaroxaban (XARELTO) 20 MG TABS tablet Take 1 tablet (20 mg total) by mouth at bedtime. 30 tablet 11   No current facility-administered medications for this visit.     Physical Exam: Vitals:  10/09/16 1020  BP: 129/78  Pulse: 88  Weight: 280 lb 2 oz (127.1 kg)  Height: 5\' 11"  (1.803 m)    GEN- The patient is well appearing, alert and oriented x 3 today.   Head- normocephalic, atraumatic Eyes-  Sclera clear, conjunctiva pink Ears- hearing intact Oropharynx- clear Lungs- Clear to ausculation bilaterally, normal work of breathing Heart- regular rate and rhythm, no murmurs, rubs or gallops, PMI not laterally displaced GI- soft, NT, ND, + BS Extremities- no clubbing, cyanosis, or edema Pacemaker pocket is well healed  ekg today is personally reviewed and reveals AV pacing  PPM interrogation is personally  Reviewed   Assessment and Plan:  1. Afib Doing well s/p MAZE Stop amiodarone Continue xarelto Consider weaning beta blocker on return  2. H/o DVT Continue xarelto long term  3. Overweight Body mass index is 39.07 kg/m. Weight reduction advised  4. Sick sinus syndrome Atrial dependant today Stop amiodarone Normal pacemaker function See Pace Art report Lower pacing rate reduced from 70 to 60 bpm today carelink  5. Nonischemic CM EF 45% Repeat echo upon return  Return to see me in 3  months   [name] MD, [facility] 10/09/2016 10:38 AM

## 2016-10-27 ENCOUNTER — Telehealth: Payer: Self-pay | Admitting: Internal Medicine

## 2016-10-27 NOTE — Telephone Encounter (Signed)
10/27/16 1645  Returned call to patient requesting decongestant.  Explained to patient she could take Mucinex without D, Claritin, or Coricidin products with the heart on the packaging.  Pt stated she had tried all of these and they didn't work.  Encouraged patient to drink water to help thin secretions.  Pt verbalized understanding. Georgana Curio MHA RN CCM

## 2016-10-27 NOTE — Telephone Encounter (Signed)
New message    pt verbalized that she is calling to speak to rn    She has congestion and wants to know what she can take over the counter

## 2016-11-10 ENCOUNTER — Telehealth: Payer: Self-pay | Admitting: *Deleted

## 2016-11-10 NOTE — Telephone Encounter (Signed)
Destiny Dennis calling to verify that she could go to [facility] and ride roller coasters after her CRTP implant (06/08/16). She reports that she spoke with [employer] and they advised her against riding roller coasters because her "lead could fall out". I let her know that our office advises against jerking/ strenuous movements with affected arm for 6 weeks after procedure, the leads should have healed into place and that a sit-down roller coaster should not impact her device. I told her that we could not gaurantee that nothing would ever happen to the CRTP leads as they are the "weakest link" in the system, but that would not be expected. She verbalizes understanding and is appreciative of the call.

## 2016-12-21 ENCOUNTER — Encounter: Payer: Self-pay | Admitting: *Deleted

## 2016-12-31 ENCOUNTER — Encounter (INDEPENDENT_AMBULATORY_CARE_PROVIDER_SITE_OTHER): Payer: Self-pay | Admitting: Family Medicine

## 2017-01-06 ENCOUNTER — Encounter: Payer: Self-pay | Admitting: Internal Medicine

## 2017-01-06 ENCOUNTER — Ambulatory Visit (INDEPENDENT_AMBULATORY_CARE_PROVIDER_SITE_OTHER): Payer: 59 | Admitting: Internal Medicine

## 2017-01-06 VITALS — BP 140/78 | HR 82 | Ht 71.0 in | Wt 279.8 lb

## 2017-01-06 DIAGNOSIS — I481 Persistent atrial fibrillation: Secondary | ICD-10-CM | POA: Diagnosis not present

## 2017-01-06 DIAGNOSIS — Z950 Presence of cardiac pacemaker: Secondary | ICD-10-CM | POA: Diagnosis not present

## 2017-01-06 DIAGNOSIS — I495 Sick sinus syndrome: Secondary | ICD-10-CM | POA: Diagnosis not present

## 2017-01-06 DIAGNOSIS — I5022 Chronic systolic (congestive) heart failure: Secondary | ICD-10-CM | POA: Diagnosis not present

## 2017-01-06 DIAGNOSIS — I4819 Other persistent atrial fibrillation: Secondary | ICD-10-CM

## 2017-01-06 LAB — CUP PACEART INCLINIC DEVICE CHECK
Battery Voltage: 3.06 V
Brady Statistic AP VP Percent: 97.86 %
Brady Statistic AS VP Percent: 0.32 %
Brady Statistic RA Percent Paced: 99.48 %
Brady Statistic RV Percent Paced: 20.01 %
Date Time Interrogation Session: 20180523133645
Implantable Lead Implant Date: 20171023
Implantable Lead Implant Date: 20171023
Implantable Lead Location: 753859
Implantable Pulse Generator Implant Date: 20171023
Lead Channel Impedance Value: 399 Ohm
Lead Channel Impedance Value: 475 Ohm
Lead Channel Impedance Value: 513 Ohm
Lead Channel Impedance Value: 551 Ohm
Lead Channel Impedance Value: 589 Ohm
Lead Channel Pacing Threshold Amplitude: 0.75 V
Lead Channel Pacing Threshold Pulse Width: 0.4 ms
Lead Channel Sensing Intrinsic Amplitude: 2 mV
Lead Channel Setting Pacing Amplitude: 2 V
Lead Channel Setting Pacing Amplitude: 2.5 V
Lead Channel Setting Sensing Sensitivity: 2 mV
MDC IDC LEAD IMPLANT DT: 20171023
MDC IDC LEAD LOCATION: 753858
MDC IDC LEAD LOCATION: 753860
MDC IDC MSMT BATTERY REMAINING LONGEVITY: 121 mo
MDC IDC MSMT LEADCHNL LV IMPEDANCE VALUE: 494 Ohm
MDC IDC MSMT LEADCHNL LV IMPEDANCE VALUE: 589 Ohm
MDC IDC MSMT LEADCHNL LV PACING THRESHOLD AMPLITUDE: 1.25 V
MDC IDC MSMT LEADCHNL LV PACING THRESHOLD PULSEWIDTH: 0.4 ms
MDC IDC MSMT LEADCHNL RA IMPEDANCE VALUE: 323 Ohm
MDC IDC MSMT LEADCHNL RV IMPEDANCE VALUE: 1007 Ohm
MDC IDC MSMT LEADCHNL RV IMPEDANCE VALUE: 1178 Ohm
MDC IDC MSMT LEADCHNL RV PACING THRESHOLD AMPLITUDE: 0.5 V
MDC IDC MSMT LEADCHNL RV PACING THRESHOLD PULSEWIDTH: 0.4 ms
MDC IDC MSMT LEADCHNL RV SENSING INTR AMPL: 17.75 mV
MDC IDC SET LEADCHNL LV PACING AMPLITUDE: 1.75 V
MDC IDC SET LEADCHNL LV PACING PULSEWIDTH: 0.4 ms
MDC IDC SET LEADCHNL RV PACING PULSEWIDTH: 0.4 ms
MDC IDC STAT BRADY AP VS PERCENT: 1.67 %
MDC IDC STAT BRADY AS VS PERCENT: 0.15 %

## 2017-01-06 NOTE — Progress Notes (Signed)
PCP: Lucretia Kern, DO  Destiny Dennis is a 56 y.o. female who presents today for routine electrophysiology followup.  Since last being seen in our clinic, the patient reports doing very well.  She is very pleased with her current health state.   Today, she denies symptoms of palpitations, chest pain, shortness of breath,  lower extremity edema, dizziness, presyncope, or syncope.  The patient is otherwise without complaint today.   Past Medical History:  Diagnosis Date  . Allergic rhinitis   . Anxiety    when tachycardia occurs  . Arthritis    OA, s/p numerous surgeries -back, knees, hip  . Atrial flutter ([place])   . Atrial septal defect 01/01/2016   Discovered on TEE   . Benign fundic gland polyps of stomach   . Cervical disc disease   . Complication of anesthesia   . Diverticulitis    CT Scan   . DVT (deep venous thrombosis) ([place])    in pregnancy, s/p hip surgery  . Eustachian tube dysfunction   . GERD 12/23/2009   diet related-not a problem  . Headache(784.0)    migraines occ.  Marland Kitchen Hemorrhoids   . Hiatal hernia    small  . History of MRSA infection 2008   superficial skin-cleared easily with doxycycline  . Hypertension   . Obesity   . Peripheral vascular disease ([place])    left leg after hip surgery  . Persistent atrial fibrillation ([place]) 03/05/2007   a. s/p RFCA 12/18/2011, 07/29/12  . PONV (postoperative nausea and vomiting)   . s/p atrial septal defect repair 05/28/2016  . S/P Maze operation for atrial fibrillation 05/28/2016   Complete bilateral atrial lesion set using cryothermy and bipolar radiofrequency ablation with clipping of LA appendage via median sternotomy  . Sialoadenitis   . SVT (supraventricular tachycardia) ([place]) 03/18/2010   PVCs  . Tachy-brady syndrome [facility])    Past Surgical History:  Procedure Laterality Date  . ASD REPAIR N/A 05/28/2016   Procedure: ATRIAL SEPTAL DEFECT (ASD) closure;  Surgeon: Rexene Alberts, MD;  Location: [place];  Service: Open Heart  Surgery;  Laterality: N/A;  . ATRIAL FIBRILLATION ABLATION  07/29/2012   PVI by Dr Rayann Heman (second procedure)  . ATRIAL FIBRILLATION ABLATION N/A 12/18/2011   Procedure: ATRIAL FIBRILLATION ABLATION;  Surgeon: [name], MD;  Location: [facility] CATH LAB;  Service: Cardiovascular;  Laterality: N/A;  . ATRIAL FIBRILLATION ABLATION N/A 07/29/2012   Procedure: ATRIAL FIBRILLATION ABLATION;  Surgeon: [name], MD;  Location: [facility] CATH LAB;  Service: Cardiovascular;  Laterality: N/A;  . atrial fibrillation and atrial flutter ablation  12/17/11, 07/29/12   PVI and CTI ablations by Dr Rayann Heman  . CARDIAC CATHETERIZATION N/A 04/10/2016   Procedure: Right/Left Heart Cath and Coronary Angiography;  Surgeon: Jettie Booze, MD;  Location: [place] CV LAB;  Service: Cardiovascular;  Laterality: N/A;  . CERVICAL FUSION    . CESAREAN SECTION     x 2  . CHOLECYSTECTOMY    . CLIPPING OF ATRIAL APPENDAGE  05/28/2016   Procedure: CLIPPING OF ATRIAL APPENDAGE;  Surgeon: Rexene Alberts, MD;  Location: [place];  Service: Open Heart Surgery;;  . COLONOSCOPY  07/16/2011   diverticulosis  . ELBOW SURGERY     Right  . EP IMPLANTABLE DEVICE N/A 09/10/2015   Procedure: Loop Recorder Insertion;  Surgeon: [name], MD;  Location: [place] CV LAB;  Service: Cardiovascular;  Laterality: N/A;  . EP IMPLANTABLE DEVICE N/A 06/08/2016   MDT  Percepta Quad CRT-P implanted by Dr Curt Bears  . FLEXIBLE SIGMOIDOSCOPY     1990's  . FOOT SURGERY     Right   . I&D KNEE WITH POLY EXCHANGE Left 12/12/2012   Procedure: LEFT KNEE EXCISION SAPHENOUS NEUROMA/OPEN SCAR DEBRIDEMENT/POLY EXCHANGE/NERVE EXCISION;  Surgeon: Mauri Pole, MD;  [facility];  Service: Orthopedics;  Laterality: Left;  . JOINT REPLACEMENT Bilateral    4'14  knees  . KNEE SURGERY     x 9 Left Knee  . MAZE  05/28/2016   Procedure: MAZE;  Surgeon: Rexene Alberts, MD;  Location: [place];  Service: Open Heart Surgery;;  . MEDIASTERNOTOMY N/A 05/28/2016    Procedure: MEDIAN STERNOTOMY;  Surgeon: Rexene Alberts, MD;  Location: [place];  Service: Open Heart Surgery;  Laterality: N/A;  . SHOULDER SURGERY     Right   . TEE WITHOUT CARDIOVERSION  12/17/2011   Procedure: TRANSESOPHAGEAL ECHOCARDIOGRAM (TEE);  Surgeon: Larey Dresser, MD;  Location: [facility] ENDOSCOPY;  Service: Cardiovascular;  Laterality: N/A;  . TEE WITHOUT CARDIOVERSION  07/28/2012   Procedure: TRANSESOPHAGEAL ECHOCARDIOGRAM (TEE);  Surgeon: Thayer Headings, MD;  Location: [place];  Service: Cardiovascular;  Laterality: N/A;  . TEE WITHOUT CARDIOVERSION N/A 01/01/2016   Procedure: TRANSESOPHAGEAL ECHOCARDIOGRAM (TEE);  Surgeon: Sanda Klein, MD;  Location: [facility] ENDOSCOPY;  Service: Cardiovascular;  Laterality: N/A;  . TEE WITHOUT CARDIOVERSION N/A 05/28/2016   Procedure: TRANSESOPHAGEAL ECHOCARDIOGRAM (TEE);  Surgeon: Rexene Alberts, MD;  Location: [place];  Service: Open Heart Surgery;  Laterality: N/A;  . TOTAL HIP ARTHROPLASTY Left 10/17/2013   Procedure: LEFT TOTAL HIP ARTHROPLASTY ANTERIOR APPROACH;  Surgeon: Mauri Pole, MD;  [facility];  Service: Orthopedics;  Laterality: Left;  . TOTAL KNEE ARTHROPLASTY Right 12/12/2012   Procedure: RIGHT TOTAL KNEE ARTHROPLASTY;  Surgeon: Mauri Pole, MD;  [facility];  Service: Orthopedics;  Laterality: Right;  . UPPER GASTROINTESTINAL ENDOSCOPY  01/30/2010   hiatal hernia, fundic gland polyps  . WRIST SURGERY     Left     ROS- all systems are reviewed and negative except as per HPI above  Current Outpatient Prescriptions  Medication Sig Dispense Refill  . acetaminophen (TYLENOL) 325 MG tablet Take 1-2 tablets (325-650 mg total) by mouth every 6 (six) hours as needed for mild pain or headache (For pain.).    Marland Kitchen amoxicillin (AMOXIL) 500 MG capsule Take 2,000 mg by mouth daily as needed. Take 1 hour prior to dental appointment.  4  . Cholecalciferol (VITAMIN D PO) Take 1 capsule by mouth daily.    . diazepam (VALIUM) 5 MG  tablet Take 5 mg by mouth every 6 (six) hours as needed for muscle spasms.     . metoprolol succinate (TOPROL-XL) 100 MG 24 hr tablet TAKE ONE TABLET BY MOUTH IN THE AM AND HALF A TABLET IN THE PM DAILY Take with or immediately following a meal.    . Multiple Vitamin (MULTIVITAMIN) capsule Take 1 capsule by mouth daily.    . Omega-3 Fatty Acids (FISH OIL PO) Take 1 capsule by mouth daily.     . pantoprazole (PROTONIX) 40 MG tablet TAKE 1 TABLET (40 MG TOTAL) BY MOUTH DAILY AS NEEDED (HEARTBURN). 90 tablet 1  . rivaroxaban (XARELTO) 20 MG TABS tablet Take 1 tablet (20 mg total) by mouth at bedtime. 30 tablet 11   No current facility-administered medications for this visit.     Physical Exam: Vitals:   01/06/17 1119  BP:  140/78  Pulse: 82  SpO2: 98%  Weight: 279 lb 12.8 oz (126.9 kg)  Height: 5\' 11"  (1.803 m)    GEN- The patient is well appearing, alert and oriented x 3 today.   Head- normocephalic, atraumatic Eyes-  Sclera clear, conjunctiva pink Ears- hearing intact Oropharynx- clear Lungs- Clear to ausculation bilaterally, normal work of breathing Chest- pacemaker pocket is well healed Heart- Regular rate and rhythm, no murmurs, rubs or gallops, PMI not laterally displaced GI- soft, NT, ND, + BS Extremities- no clubbing, cyanosis, or edema  Pacemaker interrogation- reviewed in detail today,  See PACEART report  Assessment and Plan:  1. Sick sinus syndrome Normal BiV pacemaker function See Pace Art report No changes today  2. Persistent afib/ atypical atrial flutter Doing great off of antiarrhythmic therapy.  No afib post MAZE as confirmed by PPM interrogation. chads2vasc score is 1.  She has also had a provoked DVT previously. We had a long discussion today about [place] therapy and stroke prevention.  Given that she has had LAA amputation, no afib on PPM interrogation, and Chads2vasc score of 1, I have advised that she stop anticoagulation at this time. She is reluctant to  do so and is clear in her preference to continue anticoagulation for now.  3. Overweight She has enrolled in [facility] program and is excited about beginning exercise soon.  4. Nonischemic CM Update echo at this time Hopefully EF has recovered s/p MAZE.   Return to see me in 6 months Carelink She already has scheduled follow-up with Dr Roxy Manns in October.  [name] MD, [facility] 01/06/2017 12:14 PM

## 2017-01-06 NOTE — Addendum Note (Signed)
Addended by: Janan Halter F on: 01/06/2017 04:37 PM   Modules accepted: Orders

## 2017-01-06 NOTE — Patient Instructions (Signed)
Medication Instructions:   Your physician recommends that you continue on your current medications as directed. Please refer to the Current Medication list given to you today.   Labwork: None ordered   Testing/Procedures: None ordered   Follow-Up:  Remote monitoring is used to monitor your Pacemaker from home. This monitoring reduces the number of office visits required to check your device to one time per year. It allows [place] to keep an eye on the functioning of your device to ensure it is working properly. You are scheduled for a device check from home on 04/07/17. You may send your transmission at any time that day. If you have a wireless device, the transmission will be sent automatically. After your physician reviews your transmission, you will receive a postcard with your next transmission date.  Your physician wants you to follow-up in: 6 months with Dr Vallery Ridge will receive a reminder letter in the mail two months in advance. If you don't receive a letter, please call our office to schedule the follow-up appointment.     Any Other Special Instructions Will Be Listed Below (If Applicable).     If you need a refill on your cardiac medications before your next appointment, please call your pharmacy.

## 2017-01-12 ENCOUNTER — Telehealth: Payer: Self-pay | Admitting: *Deleted

## 2017-01-12 NOTE — Telephone Encounter (Signed)
Destiny Dennis calling regarding palpitations and occasional lightheadedness since Wednesday evening. No device or medication changes at appt with Dr. Rayann Heman. PVC count increased. She denies feeling as if she might pass out, just occasionally lightheaded. Will review with JA when he is in the office tomorrow and call Destiny Dennis back with any recommendations.

## 2017-01-13 ENCOUNTER — Telehealth: Payer: Self-pay | Admitting: Internal Medicine

## 2017-01-13 NOTE — Telephone Encounter (Signed)
Called patient to schedule echo, the patient stated she could not afford to pay for another echo that is routine and patient said Dr Rayann Heman did not say anything about needing one at her last visit.

## 2017-01-13 NOTE — Telephone Encounter (Signed)
Spoke with patient and she is going to do echo prior to 6 month OV.

## 2017-01-14 ENCOUNTER — Other Ambulatory Visit: Payer: Self-pay | Admitting: Internal Medicine

## 2017-01-14 ENCOUNTER — Telehealth: Payer: Self-pay | Admitting: Internal Medicine

## 2017-01-14 DIAGNOSIS — I480 Paroxysmal atrial fibrillation: Secondary | ICD-10-CM

## 2017-01-14 NOTE — Telephone Encounter (Signed)
Echo order done

## 2017-01-18 ENCOUNTER — Ambulatory Visit (INDEPENDENT_AMBULATORY_CARE_PROVIDER_SITE_OTHER): Payer: 59 | Admitting: Family Medicine

## 2017-01-18 ENCOUNTER — Encounter (INDEPENDENT_AMBULATORY_CARE_PROVIDER_SITE_OTHER): Payer: Self-pay | Admitting: Family Medicine

## 2017-01-18 VITALS — BP 130/77 | HR 71 | Temp 97.9°F | Ht 71.0 in | Wt 277.0 lb

## 2017-01-18 DIAGNOSIS — R0602 Shortness of breath: Secondary | ICD-10-CM

## 2017-01-18 DIAGNOSIS — Z1389 Encounter for screening for other disorder: Secondary | ICD-10-CM

## 2017-01-18 DIAGNOSIS — E669 Obesity, unspecified: Secondary | ICD-10-CM | POA: Diagnosis not present

## 2017-01-18 DIAGNOSIS — Z1331 Encounter for screening for depression: Secondary | ICD-10-CM

## 2017-01-18 DIAGNOSIS — I10 Essential (primary) hypertension: Secondary | ICD-10-CM | POA: Diagnosis not present

## 2017-01-18 DIAGNOSIS — R5383 Other fatigue: Secondary | ICD-10-CM

## 2017-01-18 DIAGNOSIS — Z0289 Encounter for other administrative examinations: Secondary | ICD-10-CM

## 2017-01-18 NOTE — Progress Notes (Signed)
Office: [phone]  /  Fax: [phone]   HPI:   Chief Complaint: OBESITY  Destiny Dennis (MR# [id number]) is a 56 y.o. female who presents on 01/18/2017 for obesity evaluation and treatment. Current BMI is Body mass index is 38.63 kg/m.Destiny Dennis has struggled with obesity for years and has been unsuccessful in either losing weight or maintaining long term weight loss. [name] attended our information session and states she is currently in the action stage of change and ready to dedicate time achieving and maintaining a healthier weight.  Destiny Dennis states her family eats meals together she thinks her family will eat healthier with  her her desired weight loss is 80 lbs or more She started gaining weight in 2010 The heaviest weight was 280 lbs She snacks frequently in the evenings She skips meals frequently She has binge eating behaviors She struggles with emotional eating   Fatigue [name] feels her energy is lower than it should be. This has worsened with weight gain and has not worsened recently. [name] admits to daytime somnolence and  denies waking up still tired. Patient is at risk for obstructive sleep apnea. Patent has a history of symptoms of daytime fatigue and hypertension. Patient generally gets 8 or 9 hours of sleep per night, and states they generally have restful sleep. Snoring is present. Apneic episodes are not present. Epworth Sleepiness Score is 9  Dyspnea on exertion Destiny Dennis notes increasing shortness of breath with exercising and seems to be worsening over time with weight gain. She notes getting out of breath sooner with activity than she used to. This has not gotten worse recently. [name] denies orthopnea.  Hypertension Destiny Dennis is a 56 y.o. female with hypertension. She is currently on metoprolol for Afib and had recent cardiac surgery.  Destiny Dennis denies chest pain. She is working weight loss to help control her blood pressure with the goal of decreasing  her risk of heart attack and stroke. [name] blood pressure is currently controlled.   Depression Screen Destiny Dennis's Food and Mood (modified PHQ-9) score was  Depression screen PHQ 2/9 01/18/2017  Decreased Interest 2  Down, Depressed, Hopeless 2  PHQ - 2 Score 4  Altered sleeping 2  Tired, decreased energy 3  Change in appetite 3  Feeling bad or failure about yourself  3  Trouble concentrating 1  Moving slowly or fidgety/restless 2  Suicidal thoughts 0  PHQ-9 Score 18  Some recent data might be hidden    ALLERGIES: Allergies  Allergen Reactions  . Avelox [Moxifloxacin Hcl In Nacl] Other (See Comments)    Numbness & tingling Peripheral neuropathy    MEDICATIONS: Current Outpatient Prescriptions on File Prior to Visit  Medication Sig Dispense Refill  . acetaminophen (TYLENOL) 325 MG tablet Take 1-2 tablets (325-650 mg total) by mouth every 6 (six) hours as needed for mild pain or headache (For pain.).    Destiny Kitchen amoxicillin (AMOXIL) 500 MG capsule Take 2,000 mg by mouth daily as needed. Take 1 hour prior to dental appointment.  4  . Cholecalciferol (VITAMIN D PO) Take 1 capsule by mouth daily.    . diazepam (VALIUM) 5 MG tablet Take 5 mg by mouth every 6 (six) hours as needed for muscle spasms.     . metoprolol succinate (TOPROL-XL) 100 MG 24 hr tablet TAKE ONE TABLET BY MOUTH IN THE AM AND HALF A TABLET IN THE PM DAILY Take with or immediately following a meal.    . Multiple Vitamin (MULTIVITAMIN) capsule Take  1 capsule by mouth daily.    . Omega-3 Fatty Acids (FISH OIL PO) Take 1 capsule by mouth daily.     . pantoprazole (PROTONIX) 40 MG tablet TAKE 1 TABLET (40 MG TOTAL) BY MOUTH DAILY AS NEEDED (HEARTBURN). 90 tablet 1  . rivaroxaban (XARELTO) 20 MG TABS tablet Take 1 tablet (20 mg total) by mouth at bedtime. 30 tablet 11  . [DISCONTINUED] flecainide (TAMBOCOR) 100 MG tablet Take 1 tablet (100 mg total) by mouth 2 (two) times daily. 60 tablet 12   No current facility-administered  medications on file prior to visit.     PAST MEDICAL HISTORY: Past Medical History:  Diagnosis Date  . Allergic rhinitis   . Anxiety    when tachycardia occurs  . Arthritis    OA, s/p numerous surgeries -back, knees, hip  . Atrial flutter ([place])   . Atrial septal defect 01/01/2016   Discovered on TEE   . Benign fundic gland polyps of stomach   . Cervical disc disease   . Complication of anesthesia   . Diverticulitis    CT Scan   . DVT (deep venous thrombosis) ([place])    in pregnancy, s/p hip surgery  . Eustachian tube dysfunction   . Fatty liver   . Gallbladder problem   . GERD 12/23/2009   diet related-not a problem  . Headache(784.0)    migraines occ.  Destiny Kitchen Hemorrhoids   . Hiatal hernia    small  . History of MRSA infection 2008   superficial skin-cleared easily with doxycycline  . Hypertension   . Left ankle pain   . Left knee pain   . Obesity   . Peripheral vascular disease ([place])    left leg after hip surgery  . Persistent atrial fibrillation ([place]) 03/05/2007   a. s/p RFCA 12/18/2011, 07/29/12  . PONV (postoperative nausea and vomiting)   . s/p atrial septal defect repair 05/28/2016  . S/P Maze operation for atrial fibrillation 05/28/2016   Complete bilateral atrial lesion set using cryothermy and bipolar radiofrequency ablation with clipping of LA appendage via median sternotomy  . Sialoadenitis   . SVT (supraventricular tachycardia) ([place]) 03/18/2010   PVCs  . Tachy-brady syndrome ([place])     PAST SURGICAL HISTORY: Past Surgical History:  Procedure Laterality Date  . ASD REPAIR N/A 05/28/2016   Procedure: ATRIAL SEPTAL DEFECT (ASD) closure;  Surgeon: Rexene Alberts, MD;  Location: [facility];  Service: Open Heart Surgery;  Laterality: N/A;  . ATRIAL FIBRILLATION ABLATION N/A 12/18/2011   Procedure: ATRIAL FIBRILLATION ABLATION;  Surgeon: [name], MD;  Location: [facility] CATH LAB;  Service: Cardiovascular;  Laterality: N/A;  . ATRIAL FIBRILLATION ABLATION N/A 07/29/2012    Procedure: ATRIAL FIBRILLATION ABLATION;  Surgeon: [name], MD;  Location: [facility] CATH LAB;  Service: Cardiovascular;  Laterality: N/A;  . CARDIAC CATHETERIZATION N/A 04/10/2016   Procedure: Right/Left Heart Cath and Coronary Angiography;  Surgeon: Jettie Booze, MD;  Location: [place] CV LAB;  Service: Cardiovascular;  Laterality: N/A;  . CERVICAL FUSION    . CESAREAN SECTION     x 2  . CHOLECYSTECTOMY    . CLIPPING OF ATRIAL APPENDAGE  05/28/2016   Procedure: CLIPPING OF ATRIAL APPENDAGE;  Surgeon: Rexene Alberts, MD;  Location: [place];  Service: Open Heart Surgery;;  . COLONOSCOPY  07/16/2011   diverticulosis  . ELBOW SURGERY     Right  . EP IMPLANTABLE DEVICE N/A 09/10/2015   Procedure: Loop Recorder Insertion;  Surgeon: Jeneen Rinks  Allred, MD;  Location: [place] CV LAB;  Service: Cardiovascular;  Laterality: N/A;  . EP IMPLANTABLE DEVICE N/A 06/08/2016   MDT Marcelino Scot CRT-P implanted by Dr Curt Bears  . FLEXIBLE SIGMOIDOSCOPY     1990's  . FOOT SURGERY     Right   . I&D KNEE WITH POLY EXCHANGE Left 12/12/2012   Procedure: LEFT KNEE EXCISION SAPHENOUS NEUROMA/OPEN SCAR DEBRIDEMENT/POLY EXCHANGE/NERVE EXCISION;  Surgeon: Mauri Pole, MD;  Location: [facility];  Service: Orthopedics;  Laterality: Left;  . JOINT REPLACEMENT Bilateral    4'14  knees  . KNEE SURGERY     x 9 Left Knee  . MAZE  05/28/2016   Procedure: MAZE;  Surgeon: Rexene Alberts, MD;  Location: [place];  Service: Open Heart Surgery;;  . MEDIASTERNOTOMY N/A 05/28/2016   Procedure: MEDIAN STERNOTOMY;  Surgeon: Rexene Alberts, MD;  Location: [place];  Service: Open Heart Surgery;  Laterality: N/A;  . SHOULDER SURGERY     Right   . TEE WITHOUT CARDIOVERSION  12/17/2011   Procedure: TRANSESOPHAGEAL ECHOCARDIOGRAM (TEE);  Surgeon: Larey Dresser, MD;  Location: [facility] ENDOSCOPY;  Service: Cardiovascular;  Laterality: N/A;  . TEE WITHOUT CARDIOVERSION  07/28/2012   Procedure: TRANSESOPHAGEAL ECHOCARDIOGRAM (TEE);   Surgeon: Thayer Headings, MD;  Location: [place];  Service: Cardiovascular;  Laterality: N/A;  . TEE WITHOUT CARDIOVERSION N/A 01/01/2016   Procedure: TRANSESOPHAGEAL ECHOCARDIOGRAM (TEE);  Surgeon: Sanda Klein, MD;  Location[facility] END[facility]COPY;  Service: Cardiovascular;  Laterality: N/A;  . TEE WITHOUT CARDIOVERSION N/A 05/28/2016   Procedure: TRANSESOPHAGEAL ECHOCARDIOGRAM (TEE);  Surgeon: Rexene Alberts, MD;  Location: [place];  Service: Open Heart Surgery;  Laterality: N/A;  . TOTAL HIP ARTHROPLASTY Left 10/17/2013   Procedure: LEFT TOTAL HIP ARTHROPLASTY ANTERIOR APPROACH;  Surgeon: Mauri Pole, MD;  Location: [facility];  Service: Orthopedics;  Laterality: Left;  . TOTAL KNEE ARTHROPLASTY Right 12/12/2012   Procedure: RIGHT TOTAL KNEE ARTHROPLASTY;  Surgeon: Mauri Pole, MD;  Location: [facility];  Service: Orthopedics;  Laterality: Right;  . TUBAL LIGATION    . UPPER GASTROINTESTINAL ENDOSCOPY  01/30/2010   hiatal hernia, fundic gland polyps  . WRIST SURGERY     Left     SOCIAL HISTORY: Social History  Substance Use Topics  . Smoking status: Never Smoker  . Smokeless tobacco: Never Used  . Alcohol use Yes     Comment: rare social    FAMILY HISTORY: Family History  Problem Relation Age of Onset  . Diabetes Mother   . Hyperlipidemia Mother   . Liver disease Father   . Obesity Father   . Diabetes Maternal Grandfather   . Diverticulitis Brother        x 2  . Diverticulitis Paternal Grandmother   . Colon cancer Neg Hx     ROS: Review of Systems  HENT: Positive for congestion (nasal stuffiness), ear pain and sinus pain.   Eyes: Positive for redness.       Wear Glasses or Contacts  Cardiovascular: Negative for chest pain and orthopnea.  Gastrointestinal: Positive for heartburn (occasional).  Musculoskeletal: Positive for joint pain.       Neck stiffness  Neurological: Positive for headaches.    PHYSICAL EXAM: Blood pressure 130/77, pulse 71, temperature 97.9 F (36.6  C), temperature source Oral, height 5\' 11"  (1.803 m), weight 277 lb (125.6 kg), last menstrual period 04/17/2012, SpO2 99 %. Body mass index is 38.63 kg/m. Physical Exam  Constitutional: She is oriented  to person, place, and time. She appears well-developed and well-nourished.  Cardiovascular: Normal rate.   Pulmonary/Chest: [place] normal.  Musculoskeletal: Normal range of motion.  Neurological: She is oriented to person, place, and time.  Skin: Skin is warm and dry.  Psychiatric: She has a normal mood and affect. Her behavior is normal.  Vitals reviewed.   RECENT LABS AND TESTS: BMET    Component Value Date/Time   NA 140 06/24/2016 1343   K 4.1 06/24/2016 1343   CL 103 06/24/2016 1343   CO2 27 06/24/2016 1343   GLUCOSE 117 (H) 06/24/2016 1343   BUN 11 06/24/2016 1343   CREATININE 0.91 06/24/2016 1343   CALCIUM 9.4 06/24/2016 1343   GFRNONAA >60 06/04/2016 0312   GFRAA >60 06/04/2016 0312   Lab Results  Component Value Date   HGBA1C 5.4 05/26/2016   No results found for: INSULIN CBC    Component Value Date/Time   WBC 8.5 06/24/2016 1343   RBC 3.95 06/24/2016 1343   HGB 10.6 (L) 06/24/2016 1343   HCT 33.7 (L) 06/24/2016 1343   PLT 458 (H) 06/24/2016 1343   MCV 85.3 06/24/2016 1343   MCH 26.8 (L) 06/24/2016 1343   MCHC 31.5 (L) 06/24/2016 1343   RDW 15.4 (H) 06/24/2016 1343   LYMPHSABS 1,917 12/24/2015 0922   MONOABS 497 12/24/2015 0922   EOSABS 142 12/24/2015 0922   BASOSABS 0 12/24/2015 0922   Iron/TIBC/Ferritin/ %Sat No results found for: IRON, TIBC, FERRITIN, IRONPCTSAT Lipid Panel     Component Value Date/Time   CHOL 188 06/13/2014 1022   TRIG 191.0 (H) 06/13/2014 1022   HDL 52.20 06/13/2014 1022   CHOLHDL 4 06/13/2014 1022   VLDL 38.2 06/13/2014 1022   LDLCALC 98 06/13/2014 1022   LDLDIRECT 139.5 11/15/2006 1437   Hepatic Function Panel     Component Value Date/Time   PROT 7.2 05/26/2016 1600   ALBUMIN 4.2 05/26/2016 1600   AST 19 05/26/2016  1600   ALT 25 05/26/2016 1600   ALKPHOS 84 05/26/2016 1600   BILITOT 0.3 05/26/2016 1600   BILIDIR 0.1 05/04/2012 0938   IBILI 0.5 04/27/2011 0044      Component Value Date/Time   TSH 2.217 08/21/2015 1515   TSH 1.597 05/21/2015 0900   TSH 1.76 06/13/2014 1022   TSH 1.720 10/21/2013 1024   TSH 1.56 05/04/2012 0938    ECG  shows NSR with a rate of 61 BPM INDIRECT CALORIMETER done today shows a VO2 of 252 and a REE of 1753.    ASSESSMENT AND PLAN: Other fatigue - Plan: EKG 12-Lead, T4, free, T3, TSH, Folate, VITAMIN D 25 Hydroxy (Vit-D Deficiency, Fractures), Lipid Panel With LDL/HDL Ratio, Hemoglobin A1c, CBC With Differential, Vitamin B12, Insulin, random  Essential hypertension - Plan: Comprehensive metabolic panel  SOB (shortness of breath) on exertion  Depression screening  Obesity (BMI 35.0-39.9 without comorbidity)  PLAN:  Fatigue Shamila was informed that her fatigue may be related to obesity, depression or many other causes. Labs will be ordered, and in the meanwhile Kamrie has agreed to work on diet, exercise and weight loss to help with fatigue. Proper sleep hygiene was discussed including the need for 7-8 hours of quality sleep each night. A sleep study was not ordered based on symptoms and Epworth score.  Dyspnea on exertion Eyonna's shortness of breath appears to be obesity related and exercise induced. She has agreed to work on weight loss and gradually increase exercise to treat her exercise induced shortness  of breath. If Ayanni follows our instructions and loses weight without improvement of her shortness of breath, we will plan to refer to pulmonology. We will monitor this condition regularly. Barbaraann agrees to this plan.  Hypertension We discussed sodium restriction, working on healthy weight loss, and a regular exercise program as the means to achieve improved blood pressure control. Makinley agreed with this plan and agreed to follow up as directed. We will  check labs and We will continue to monitor her blood pressure as well as her progress with the above lifestyle modifications. She will continue her medications as prescribed and will watch for signs of hypotension as she continues her lifestyle modifications.   Depression Screen Eleyna had a strongly positive depression screening. Depression is commonly associated with obesity and often results in emotional eating behaviors. We will monitor this closely and work on CBT to help improve the non-hunger eating patterns. Referral to Psychology may be required if no improvement is seen as she continues in our clinic.  Obesity Rogue is currently in the action stage of change and her goal is to continue with weight loss efforts She has agreed to follow the Category 3 plan Alyzabeth has been instructed to work up to a goal of 150 minutes of combined cardio and strengthening exercise per week for weight loss and overall health benefits. We discussed the following Behavioral Modification Strategies today: increasing lean protein intake, decreasing simple carbohydrates , increasing vegetables and avoiding temptations  Anhar has agreed to follow up with our clinic in 2 weeks. She was informed of the importance of frequent follow up visits to maximize her success with intensive lifestyle modifications for her multiple health conditions. She was informed we would discuss her lab results at her next visit unless there is a critical issue that needs to be addressed sooner. Ruchi agreed to keep her next visit at the agreed upon time to discuss these results.  I, Doreene Nest, am acting as scribe for Dennard Nip, MD  I have reviewed the above documentation for accuracy and completeness, and I agree with the above. -Dennard Nip, MD  OBESITY BEHAVIORAL INTERVENTION VISIT  Today's visit was # 1 out of 22.  Starting weight: 277 Starting date: 01/18/17 Today's weight : 277 Today's date: 01/18/2017 Total lbs lost  to date: 0 (Patients must lose 7 lbs in the first 6 months to continue with counseling)   ASK: We discussed the diagnosis of obesity with [name] today and Caleigha agreed to give [place] permission to discuss obesity behavioral modification therapy today.  ASSESS: Montrice has the diagnosis of obesity and her BMI today is 38.7 Brandie is in the action stage of change   ADVISE: Kandiss was educated on the multiple health risks of obesity as well as the benefit of weight loss to improve her health. She was advised of the need for long term treatment and the importance of lifestyle modifications.  AGREE: Multiple dietary modification options and treatment options were discussed and  Orville agreed to follow the above plan.

## 2017-01-19 LAB — CBC WITH DIFFERENTIAL
Basophils Absolute: 0 10*3/uL (ref 0.0–0.2)
Basos: 0 %
EOS (ABSOLUTE): 0.2 10*3/uL (ref 0.0–0.4)
Eos: 3 %
HEMATOCRIT: 36.6 % (ref 34.0–46.6)
HEMOGLOBIN: 11.9 g/dL (ref 11.1–15.9)
IMMATURE GRANS (ABS): 0 10*3/uL (ref 0.0–0.1)
Immature Granulocytes: 0 %
LYMPHS: 24 %
Lymphocytes Absolute: 1.6 10*3/uL (ref 0.7–3.1)
MCH: 26.8 pg (ref 26.6–33.0)
MCHC: 32.5 g/dL (ref 31.5–35.7)
MCV: 82 fL (ref 79–97)
Monocytes Absolute: 0.5 10*3/uL (ref 0.1–0.9)
Monocytes: 7 %
NEUTROS PCT: 66 %
Neutrophils Absolute: 4.5 10*3/uL (ref 1.4–7.0)
RBC: 4.44 x10E6/uL (ref 3.77–5.28)
RDW: 14.9 % (ref 12.3–15.4)
WBC: 6.8 10*3/uL (ref 3.4–10.8)

## 2017-01-19 LAB — TSH: TSH: 1.65 u[IU]/mL (ref 0.450–4.500)

## 2017-01-19 LAB — HEMOGLOBIN A1C
Est. average glucose Bld gHb Est-mCnc: 111 mg/dL
Hgb A1c MFr Bld: 5.5 % (ref 4.8–5.6)

## 2017-01-19 LAB — T4, FREE: FREE T4: 1.39 ng/dL (ref 0.82–1.77)

## 2017-01-19 LAB — COMPREHENSIVE METABOLIC PANEL
A/G RATIO: 1.8 (ref 1.2–2.2)
ALBUMIN: 4.7 g/dL (ref 3.5–5.5)
ALT: 26 IU/L (ref 0–32)
AST: 21 IU/L (ref 0–40)
Alkaline Phosphatase: 117 IU/L (ref 39–117)
BILIRUBIN TOTAL: 0.4 mg/dL (ref 0.0–1.2)
BUN / CREAT RATIO: 18 (ref 9–23)
BUN: 14 mg/dL (ref 6–24)
CHLORIDE: 100 mmol/L (ref 96–106)
CO2: 23 mmol/L (ref 18–29)
Calcium: 9.6 mg/dL (ref 8.7–10.2)
Creatinine, Ser: 0.77 mg/dL (ref 0.57–1.00)
GFR calc non Af Amer: 88 mL/min/{1.73_m2} (ref >59)
GFR, EST AFRICAN AMERICAN: 102 mL/min/{1.73_m2} (ref >59)
GLOBULIN, TOTAL: 2.6 g/dL (ref 1.5–4.5)
Glucose: 94 mg/dL (ref 65–99)
POTASSIUM: 4.3 mmol/L (ref 3.5–5.2)
SODIUM: 139 mmol/L (ref 134–144)
TOTAL PROTEIN: 7.3 g/dL (ref 6.0–8.5)

## 2017-01-19 LAB — LIPID PANEL WITH LDL/HDL RATIO
Cholesterol, Total: 194 mg/dL (ref 100–199)
HDL: 60 mg/dL (ref >39)
LDL Calculated: 110 mg/dL — ABNORMAL HIGH (ref 0–99)
LDL/HDL RATIO: 1.8 ratio (ref 0.0–3.2)
TRIGLYCERIDES: 121 mg/dL (ref 0–149)
VLDL Cholesterol Cal: 24 mg/dL (ref 5–40)

## 2017-01-19 LAB — T3: T3, Total: 130 ng/dL (ref 71–180)

## 2017-01-19 LAB — INSULIN, RANDOM: INSULIN: 15 u[IU]/mL (ref 2.6–24.9)

## 2017-01-19 LAB — FOLATE: FOLATE: 9.6 ng/mL (ref >3.0)

## 2017-01-19 LAB — VITAMIN B12: Vitamin B-12: 539 pg/mL (ref 232–1245)

## 2017-01-19 LAB — VITAMIN D 25 HYDROXY (VIT D DEFICIENCY, FRACTURES): Vit D, 25-Hydroxy: 27.1 ng/mL — ABNORMAL LOW (ref 30.0–100.0)

## 2017-01-31 ENCOUNTER — Other Ambulatory Visit: Payer: Self-pay | Admitting: Internal Medicine

## 2017-02-02 ENCOUNTER — Ambulatory Visit (INDEPENDENT_AMBULATORY_CARE_PROVIDER_SITE_OTHER): Payer: 59 | Admitting: Family Medicine

## 2017-02-02 VITALS — BP 125/76 | HR 77 | Temp 98.1°F | Ht 71.0 in | Wt 271.0 lb

## 2017-02-02 DIAGNOSIS — E559 Vitamin D deficiency, unspecified: Secondary | ICD-10-CM

## 2017-02-02 DIAGNOSIS — Z9189 Other specified personal risk factors, not elsewhere classified: Secondary | ICD-10-CM

## 2017-02-02 DIAGNOSIS — E8881 Metabolic syndrome: Secondary | ICD-10-CM

## 2017-02-02 DIAGNOSIS — Z6837 Body mass index (BMI) 37.0-37.9, adult: Secondary | ICD-10-CM

## 2017-02-02 DIAGNOSIS — E669 Obesity, unspecified: Secondary | ICD-10-CM | POA: Diagnosis not present

## 2017-02-02 MED ORDER — VITAMIN D (ERGOCALCIFEROL) 1.25 MG (50000 UNIT) PO CAPS: 50000.0000 [IU] | ORAL_CAPSULE | ORAL | 0 refills | Status: DC

## 2017-02-02 NOTE — Progress Notes (Signed)
Office: [phone]  /  Fax: [phone]   HPI:   Chief Complaint: OBESITY Destiny Dennis is here to discuss her progress with her obesity treatment plan. She is on the  follow the Category 3 plan and is following her eating plan approximately 99 % of the time. She states she is exercising 0 minutes 0 times per week. Destiny Dennis did very well with weight loss and did well with the plan. She did very well with planning meals ahead of time and planning snacks while travelling. Her weight is 271 lb (122.9 kg) today and has had a weight loss of 6 pounds over a period of 2 weeks since her last visit. She has lost 6 lbs since starting treatment with [place].  Vitamin D deficiency Destiny Dennis has a new diagnosis of vitamin D deficiency. She is not currently taking vit D. She admits fatigue and denies nausea, vomiting or muscle weakness.  Insulin Resistance Destiny Dennis has a new diagnosis of insulin resistance based on her elevated fasting insulin level >5, polyphagia is improved with lower simple carbohydrate diet. Although Destiny Dennis's blood glucose readings are still under good control, insulin resistance puts her at greater risk of metabolic syndrome and diabetes.  She is not taking metformin currently and continues to work on diet and exercise to decrease risk of diabetes.  At risk for diabetes Destiny Dennis is at higher than average risk for developing diabetes due to her obesity and insulin resistance. She currently denies polyuria or polydipsia.   ALLERGIES: Allergies  Allergen Reactions  . Avelox [Moxifloxacin Hcl In Nacl] Other (See Comments)    Numbness & tingling Peripheral neuropathy    MEDICATIONS: Current Outpatient Prescriptions on File Prior to Visit  Medication Sig Dispense Refill  . acetaminophen (TYLENOL) 325 MG tablet Take 1-2 tablets (325-650 mg total) by mouth every 6 (six) hours as needed for mild pain or headache (For pain.).    Marland Kitchen amoxicillin (AMOXIL) 500 MG capsule Take 2,000 mg by mouth daily as  needed. Take 1 hour prior to dental appointment.  4  . Cholecalciferol (VITAMIN D PO) Take 1 capsule by mouth daily.    . diazepam (VALIUM) 5 MG tablet Take 5 mg by mouth every 6 (six) hours as needed for muscle spasms.     . metoprolol succinate (TOPROL-XL) 100 MG 24 hr tablet TAKE ONE TABLET BY MOUTH IN THE AM AND HALF A TABLET IN THE PM DAILY Take with or immediately following a meal.    . metoprolol succinate (TOPROL-XL) 100 MG 24 hr tablet TAKE 1 TABLET TWICE A DAY WITH OR IMMEDIATELY FOLLOWING A MEAL 180 tablet 2  . Multiple Vitamin (MULTIVITAMIN) capsule Take 1 capsule by mouth daily.    . Omega-3 Fatty Acids (FISH OIL PO) Take 1 capsule by mouth daily.     . pantoprazole (PROTONIX) 40 MG tablet TAKE 1 TABLET (40 MG TOTAL) BY MOUTH DAILY AS NEEDED (HEARTBURN). 90 tablet 1  . rivaroxaban (XARELTO) 20 MG TABS tablet Take 1 tablet (20 mg total) by mouth at bedtime. 30 tablet 11  . [DISCONTINUED] flecainide (TAMBOCOR) 100 MG tablet Take 1 tablet (100 mg total) by mouth 2 (two) times daily. 60 tablet 12   No current facility-administered medications on file prior to visit.     PAST MEDICAL HISTORY: Past Medical History:  Diagnosis Date  . Allergic rhinitis   . Anxiety    when tachycardia occurs  . Arthritis    OA, s/p numerous surgeries -back, knees, hip  . Atrial flutter (  [place])   . Atrial septal defect 01/01/2016   Discovered on TEE   . Benign fundic gland polyps of stomach   . Cervical disc disease   . Complication of anesthesia   . Diverticulitis    CT Scan   . DVT (deep venous thrombosis) ([place])    in pregnancy, s/p hip surgery  . Eustachian tube dysfunction   . Fatty liver   . Gallbladder problem   . GERD 12/23/2009   diet related-not a problem  . Headache(784.0)    migraines occ.  Marland Kitchen Hemorrhoids   . Hiatal hernia    small  . History of MRSA infection 2008   superficial skin-cleared easily with doxycycline  . Hypertension   . Left ankle pain   . Left knee pain   .  Obesity   . Peripheral vascular disease ([place])    left leg after hip surgery  . Persistent atrial fibrillation ([place]) 03/05/2007   a. s/p RFCA 12/18/2011, 07/29/12  . PONV (postoperative nausea and vomiting)   . s/p atrial septal defect repair 05/28/2016  . S/P Maze operation for atrial fibrillation 05/28/2016   Complete bilateral atrial lesion set using cryothermy and bipolar radiofrequency ablation with clipping of LA appendage via median sternotomy  . Sialoadenitis   . SVT (supraventricular tachycardia) ([place]) 03/18/2010   PVCs  . Tachy-brady syndrome ([place])     PAST SURGICAL HISTORY: Past Surgical History:  Procedure Laterality Date  . ASD REPAIR N/A 05/28/2016   Procedure: ATRIAL SEPTAL DEFECT (ASD) closure;  Surgeon: Rexene Alberts, MD;  Location: [facility];  Service: Open Heart Surgery;  Laterality: N/A;  . ATRIAL FIBRILLATION ABLATION N/A 12/18/2011   Procedure: ATRIAL FIBRILLATION ABLATION;  Surgeon: [name], MD;  Location: [facility] CATH LAB;  Service: Cardiovascular;  Laterality: N/A;  . ATRIAL FIBRILLATION ABLATION N/A 07/29/2012   Procedure: ATRIAL FIBRILLATION ABLATION;  Surgeon: [name], MD;  Location: [facility] CATH LAB;  Service: Cardiovascular;  Laterality: N/A;  . CARDIAC CATHETERIZATION N/A 04/10/2016   Procedure: Right/Left Heart Cath and Coronary Angiography;  Surgeon: Jettie Booze, MD;  Location: [place] CV LAB;  Service: Cardiovascular;  Laterality: N/A;  . CERVICAL FUSION    . CESAREAN SECTION     x 2  . CHOLECYSTECTOMY    . CLIPPING OF ATRIAL APPENDAGE  05/28/2016   Procedure: CLIPPING OF ATRIAL APPENDAGE;  Surgeon: Rexene Alberts, MD;  Location: [place];  Service: Open Heart Surgery;;  . COLONOSCOPY  07/16/2011   diverticulosis  . ELBOW SURGERY     Right  . EP IMPLANTABLE DEVICE N/A 09/10/2015   Procedure: Loop Recorder Insertion;  Surgeon: [name], MD;  Location: [place] CV LAB;  Service: Cardiovascular;  Laterality: N/A;  . EP IMPLANTABLE DEVICE N/A  06/08/2016   MDT Marcelino Scot CRT-P implanted by Dr Curt Bears  . FLEXIBLE SIGMOIDOSCOPY     1990's  . FOOT SURGERY     Right   . I&D KNEE WITH POLY EXCHANGE Left 12/12/2012   Procedure: LEFT KNEE EXCISION SAPHENOUS NEUROMA/OPEN SCAR DEBRIDEMENT/POLY EXCHANGE/NERVE EXCISION;  Surgeon: Mauri Pole, MD;  Location: [facility];  Service: Orthopedics;  Laterality: Left;  . JOINT REPLACEMENT Bilateral    4'14  knees  . KNEE SURGERY     x 9 Left Knee  . MAZE  05/28/2016   Procedure: MAZE;  Surgeon: Rexene Alberts, MD;  Location: [place];  Service: Open Heart Surgery;;  . MEDIASTERNOTOMY N/A 05/28/2016   Procedure: MEDIAN STERNOTOMY;  Surgeon: Rexene Alberts, MD;  Location: [place];  Service: Open Heart Surgery;  Laterality: N/A;  . SHOULDER SURGERY     Right   . TEE WITHOUT CARDIOVERSION  12/17/2011   Procedure: TRANSESOPHAGEAL ECHOCARDIOGRAM (TEE);  Surgeon: Larey Dresser, MD;  Location: [facility] ENDOSCOPY;  Service: Cardiovascular;  Laterality: N/A;  . TEE WITHOUT CARDIOVERSION  07/28/2012   Procedure: TRANSESOPHAGEAL ECHOCARDIOGRAM (TEE);  Surgeon: Thayer Headings, MD;  Location: [place];  Service: Cardiovascular;  Laterality: N/A;  . TEE WITHOUT CARDIOVERSION N/A 01/01/2016   Procedure: TRANSESOPHAGEAL ECHOCARDIOGRAM (TEE);  Surgeon: Sanda Klein, MD;  Location: [facility] ENDOSCOPY;  Service: Cardiovascular;  Laterality: N/A;  . TEE WITHOUT CARDIOVERSION N/A 05/28/2016   Procedure: TRANSESOPHAGEAL ECHOCARDIOGRAM (TEE);  Surgeon: Rexene Alberts, MD;  Location: [place];  Service: Open Heart Surgery;  Laterality: N/A;  . TOTAL HIP ARTHROPLASTY Left 10/17/2013   Procedure: LEFT TOTAL HIP ARTHROPLASTY ANTERIOR APPROACH;  Surgeon: Mauri Pole, MD;  Location: [facility];  Service: Orthopedics;  Laterality: Left;  . TOTAL KNEE ARTHROPLASTY Right 12/12/2012   Procedure: RIGHT TOTAL KNEE ARTHROPLASTY;  Surgeon: Mauri Pole, MD;  Location: [facility];  Service: Orthopedics;  Laterality: Right;  . TUBAL LIGATION    .  UPPER GASTROINTESTINAL ENDOSCOPY  01/30/2010   hiatal hernia, fundic gland polyps  . WRIST SURGERY     Left     SOCIAL HISTORY: Social History  Substance Use Topics  . Smoking status: Never Smoker  . Smokeless tobacco: Never Used  . Alcohol use Yes     Comment: rare social    FAMILY HISTORY: Family History  Problem Relation Age of Onset  . Diabetes Mother   . Hyperlipidemia Mother   . Liver disease Father   . Obesity Father   . Diabetes Maternal Grandfather   . Diverticulitis Brother        x 2  . Diverticulitis Paternal Grandmother   . Colon cancer Neg Hx     ROS: Review of Systems  Constitutional: Positive for malaise/fatigue and weight loss.  Gastrointestinal: Negative for nausea and vomiting.  Genitourinary: Negative for frequency.  Musculoskeletal:       Negative muscle weakness  Endo/Heme/Allergies: Negative for polydipsia.       Polyphagia    PHYSICAL EXAM: Blood pressure 125/76, pulse 77, temperature 98.1 F (36.7 C), temperature source Oral, height 5\' 11"  (1.803 m), weight 271 lb (122.9 kg), last menstrual period 04/17/2012, SpO2 98 %. Body mass index is 37.8 kg/m. Physical Exam  Constitutional: She is oriented to person, place, and time. She appears well-developed and well-nourished.  Cardiovascular: Normal rate.   Pulmonary/Chest: Effort normal.  Musculoskeletal: Normal range of motion.  Neurological: She is oriented to person, place, and time.  Skin: Skin is warm and dry.  Psychiatric: She has a normal mood and affect. Her behavior is normal.  Vitals reviewed.   RECENT LABS AND TESTS: BMET    Component Value Date/Time   NA 139 01/18/2017 1221   K 4.3 01/18/2017 1221   CL 100 01/18/2017 1221   CO2 23 01/18/2017 1221   GLUCOSE 94 01/18/2017 1221   GLUCOSE 117 (H) 06/24/2016 1343   BUN 14 01/18/2017 1221   CREATININE 0.77 01/18/2017 1221   CREATININE 0.91 06/24/2016 1343   CALCIUM 9.6 01/18/2017 1221   GFRNONAA 88 01/18/2017 1221   GFRAA  102 01/18/2017 1221   Lab Results  Component Value Date   HGBA1C 5.5 01/18/2017   HGBA1C 5.4 05/26/2016  Lab Results  Component Value Date   INSULIN 15.0 01/18/2017   CBC    Component Value Date/Time   WBC 6.8 01/18/2017 1221   WBC 8.5 06/24/2016 1343   RBC 4.44 01/18/2017 1221   RBC 3.95 06/24/2016 1343   HGB 11.9 01/18/2017 1221   HCT 36.6 01/18/2017 1221   PLT 458 (H) 06/24/2016 1343   MCV 82 01/18/2017 1221   MCH 26.8 01/18/2017 1221   MCH 26.8 (L) 06/24/2016 1343   MCHC 32.5 01/18/2017 1221   MCHC 31.5 (L) 06/24/2016 1343   RDW 14.9 01/18/2017 1221   LYMPHSABS 1.6 01/18/2017 1221   MONOABS 497 12/24/2015 0922   EOSABS 0.2 01/18/2017 1221   BASOSABS 0.0 01/18/2017 1221   Iron/TIBC/Ferritin/ %Sat No results found for: IRON, TIBC, FERRITIN, IRONPCTSAT Lipid Panel     Component Value Date/Time   CHOL 194 01/18/2017 1221   TRIG 121 01/18/2017 1221   HDL 60 01/18/2017 1221   CHOLHDL 4 06/13/2014 1022   VLDL 38.2 06/13/2014 1022   LDLCALC 110 (H) 01/18/2017 1221   LDLDIRECT 139.5 11/15/2006 1437   Hepatic Function Panel     Component Value Date/Time   PROT 7.3 01/18/2017 1221   ALBUMIN 4.7 01/18/2017 1221   AST 21 01/18/2017 1221   ALT 26 01/18/2017 1221   ALKPHOS 117 01/18/2017 1221   BILITOT 0.4 01/18/2017 1221   BILIDIR 0.1 05/04/2012 0938   IBILI 0.5 04/27/2011 0044      Component Value Date/Time   TSH 1.650 01/18/2017 1221   TSH 2.217 08/21/2015 1515   TSH 1.597 05/21/2015 0900   TSH 1.76 06/13/2014 1022   TSH 1.720 10/21/2013 1024    ASSESSMENT AND PLAN: Vitamin D deficiency - Plan: Vitamin D, Ergocalciferol, (DRISDOL) 50000 units CAPS capsule  Insulin resistance  At risk for diabetes mellitus  Class 2 obesity without serious comorbidity with body mass index (BMI) of 37.0 to 37.9 in adult, unspecified obesity type  PLAN:  Vitamin D Deficiency Destiny Dennis was informed that low vitamin D levels contributes to fatigue and are associated with  obesity, breast, and colon cancer. She agrees to start to take prescription Vit D @50 ,000 IU every week #4 with no refills and will follow up for routine testing of vitamin D, at least 2-3 times per year. She was informed of the risk of over-replacement of vitamin D and agrees to not increase her dose unless he discusses this with [place] first. Destiny Dennis agrees to follow up with our clinic in 2 to 3 weeks.  Insulin Resistance Destiny Dennis will continue to work on weight loss, exercise, and decreasing simple carbohydrates in her diet to help decrease the risk of diabetes. We dicussed metformin including benefits and risks. She was informed that eating too many simple carbohydrates or too many calories at one sitting increases the likelihood of GI side effects. Destiny Dennis declined metformin for now and prescription was not written today. Destiny Dennis agreed to follow up with [place] as directed to monitor her progress.  Diabetes risk counselling Destiny Dennis was given extended (at least 30 minutes) diabetes prevention counseling today. She is 56 y.o. female and has risk factors for diabetes including obesity and insulin resistance. We discussed intensive lifestyle modifications today with an emphasis on weight loss as well as increasing exercise and decreasing simple carbohydrates in her diet.  Obesity Destiny Dennis is currently in the action stage of change. As such, her goal is to continue with weight loss efforts She has agreed to follow the Category 3  plan Destiny Dennis has been instructed to work up to a goal of 150 minutes of combined cardio and strengthening exercise per week for weight loss and overall health benefits. We discussed the following Behavioral Modification Strategies today: no skipping meals, meal planning & cooking strategies, increasing lean protein intake, decreasing simple carbohydrates  and ways to avoid night time snacking  Destiny Dennis has agreed to follow up with our clinic in 2 to 3 weeks. She was informed of the importance of  frequent follow up visits to maximize her success with intensive lifestyle modifications for her multiple health conditions.  I, Doreene Nest, am acting as transcriptionist for Dennard Nip, MD  I have reviewed the above documentation for accuracy and completeness, and I agree with the above. -Dennard Nip, MD  OBESITY BEHAVIORAL INTERVENTION VISIT  Today's visit was # 2 out of 41.  Starting weight: 277 lbs Starting date: 01/18/17 Today's weight : 271 lbs Today's date: 02/02/2017 Total lbs lost to date: 6 (Patients must lose 7 lbs in the first 6 months to continue with counseling)   ASK: We discussed the diagnosis of obesity with Destiny Dennis today and Destiny Dennis agreed to give [place] permission to discuss obesity behavioral modification therapy today.  ASSESS: Destiny Dennis has the diagnosis of obesity and her BMI today is @TBMI @ Destiny Dennis is in the action stage of change   ADVISE: Destiny Dennis was educated on the multiple health risks of obesity as well as the benefit of weight loss to improve her health. She was advised of the need for long term treatment and the importance of lifestyle modifications.  AGREE: Multiple dietary modification options and treatment options were discussed and  Destiny Dennis agreed to follow the Category 3 plan We discussed the following Behavioral Modification Strategies today: no skipping meals, meal planning & cooking strategies, increasing lean protein intake, decreasing simple carbohydrates  and ways to avoid night time snacking

## 2017-02-15 ENCOUNTER — Ambulatory Visit (INDEPENDENT_AMBULATORY_CARE_PROVIDER_SITE_OTHER): Payer: 59 | Admitting: Family Medicine

## 2017-03-01 ENCOUNTER — Ambulatory Visit (INDEPENDENT_AMBULATORY_CARE_PROVIDER_SITE_OTHER): Payer: 59 | Admitting: Physician Assistant

## 2017-03-03 ENCOUNTER — Ambulatory Visit (INDEPENDENT_AMBULATORY_CARE_PROVIDER_SITE_OTHER): Payer: 59 | Admitting: Physician Assistant

## 2017-03-03 VITALS — BP 126/77 | HR 60 | Temp 98.1°F | Ht 71.0 in

## 2017-03-03 DIAGNOSIS — IMO0001 Reserved for inherently not codable concepts without codable children: Secondary | ICD-10-CM

## 2017-03-03 DIAGNOSIS — E785 Hyperlipidemia, unspecified: Secondary | ICD-10-CM

## 2017-03-03 DIAGNOSIS — Z6836 Body mass index (BMI) 36.0-36.9, adult: Secondary | ICD-10-CM | POA: Diagnosis not present

## 2017-03-03 DIAGNOSIS — E669 Obesity, unspecified: Secondary | ICD-10-CM | POA: Diagnosis not present

## 2017-03-03 DIAGNOSIS — E559 Vitamin D deficiency, unspecified: Secondary | ICD-10-CM | POA: Diagnosis not present

## 2017-03-03 DIAGNOSIS — Z9189 Other specified personal risk factors, not elsewhere classified: Secondary | ICD-10-CM | POA: Diagnosis not present

## 2017-03-03 MED ORDER — VITAMIN D (ERGOCALCIFEROL) 1.25 MG (50000 UNIT) PO CAPS: 50000.0000 [IU] | ORAL_CAPSULE | ORAL | 0 refills | Status: DC

## 2017-03-04 ENCOUNTER — Ambulatory Visit: Payer: 59 | Admitting: Family Medicine

## 2017-03-04 NOTE — Progress Notes (Signed)
Office: [phone]  /  Fax: [phone]   HPI:   Chief Complaint: OBESITY Destiny Dennis is here to discuss her progress with her obesity treatment plan. She is on the  follow the Category 3 plan and is following her eating plan approximately 98 % of the time. She states she is swimming 45 minutes 7 times per week. Destiny Dennis continues to do well with weight loss. She plans ahead well. States hunger is well controlled. Her weight is (P) 262 lb (118.8 kg) today and has had a weight loss of 9 pounds over a period of 2 weeks since her last visit. She has lost 15 lbs since starting treatment with [place].  Vitamin D deficiency Destiny Dennis has a diagnosis of vitamin D deficiency. She is currently taking vit D and denies nausea, vomiting or muscle weakness.  Hyperlipidemia Destiny Dennis has hyperlipidemia and has been trying to improve her cholesterol levels with intensive lifestyle modification including a low saturated fat diet, exercise and weight loss. She denies any chest pain, claudication or myalgias.  At risk for cardiovascular disease Destiny Dennis is at a higher than average risk for cardiovascular disease due to hyperlipidemia, Insulin resistance, and obesity. She currently denies any chest pain.    ALLERGIES: Allergies  Allergen Reactions  . Avelox [Moxifloxacin Hcl In Nacl] Other (See Comments)    Numbness & tingling Peripheral neuropathy    MEDICATIONS: Current Outpatient Prescriptions on File Prior to Visit  Medication Sig Dispense Refill  . acetaminophen (TYLENOL) 325 MG tablet Take 1-2 tablets (325-650 mg total) by mouth every 6 (six) hours as needed for mild pain or headache (For pain.).    Marland Kitchen amoxicillin (AMOXIL) 500 MG capsule Take 2,000 mg by mouth daily as needed. Take 1 hour prior to dental appointment.  4  . Cholecalciferol (VITAMIN D PO) Take 1 capsule by mouth daily.    . diazepam (VALIUM) 5 MG tablet Take 5 mg by mouth every 6 (six) hours as needed for muscle spasms.     . metoprolol  succinate (TOPROL-XL) 100 MG 24 hr tablet TAKE ONE TABLET BY MOUTH IN THE AM AND HALF A TABLET IN THE PM DAILY Take with or immediately following a meal.    . metoprolol succinate (TOPROL-XL) 100 MG 24 hr tablet TAKE 1 TABLET TWICE A DAY WITH OR IMMEDIATELY FOLLOWING A MEAL 180 tablet 2  . Multiple Vitamin (MULTIVITAMIN) capsule Take 1 capsule by mouth daily.    . Omega-3 Fatty Acids (FISH OIL PO) Take 1 capsule by mouth daily.     . pantoprazole (PROTONIX) 40 MG tablet TAKE 1 TABLET (40 MG TOTAL) BY MOUTH DAILY AS NEEDED (HEARTBURN). 90 tablet 1  . rivaroxaban (XARELTO) 20 MG TABS tablet Take 1 tablet (20 mg total) by mouth at bedtime. 30 tablet 11  . [DISCONTINUED] flecainide (TAMBOCOR) 100 MG tablet Take 1 tablet (100 mg total) by mouth 2 (two) times daily. 60 tablet 12   No current facility-administered medications on file prior to visit.     PAST MEDICAL HISTORY: Past Medical History:  Diagnosis Date  . Allergic rhinitis   . Anxiety    when tachycardia occurs  . Arthritis    OA, s/p numerous surgeries -back, knees, hip  . Atrial flutter ([place])   . Atrial septal defect 01/01/2016   Discovered on TEE   . Benign fundic gland polyps of stomach   . Cervical disc disease   . Complication of anesthesia   . Diverticulitis    CT Scan   .  DVT (deep venous thrombosis) ([place])    in pregnancy, s/p hip surgery  . Eustachian tube dysfunction   . Fatty liver   . Gallbladder problem   . GERD 12/23/2009   diet related-not a problem  . Headache(784.0)    migraines occ.  Marland Kitchen Hemorrhoids   . Hiatal hernia    small  . History of MRSA infection 2008   superficial skin-cleared easily with doxycycline  . Hypertension   . Left ankle pain   . Left knee pain   . Obesity   . Peripheral vascular disease ([place])    left leg after hip surgery  . Persistent atrial fibrillation ([place]) 03/05/2007   a. s/p RFCA 12/18/2011, 07/29/12  . PONV (postoperative nausea and vomiting)   . s/p atrial septal defect  repair 05/28/2016  . S/P Maze operation for atrial fibrillation 05/28/2016   Complete bilateral atrial lesion set using cryothermy and bipolar radiofrequency ablation with clipping of LA appendage via median sternotomy  . Sialoadenitis   . SVT (supraventricular tachycardia) ([place]) 03/18/2010   PVCs  . Tachy-brady syndrome ([place])     PAST SURGICAL HISTORY: Past Surgical History:  Procedure Laterality Date  . ASD REPAIR N/A 05/28/2016   Procedure: ATRIAL SEPTAL DEFECT (ASD) closure;  Surgeon: Rexene Alberts, MD;  Location: [place];  Service: Open Heart Surgery;  Laterality: N/A;  . ATRIAL FIBRILLATION ABLATION N/A 12/18/2011   Procedure: ATRIAL FIBRILLATION ABLATION;  Surgeon: [name], MD;  Location: [facility] CATH LAB;  Service: Cardiovascular;  Laterality: N/A;  . ATRIAL FIBRILLATION ABLATION N/A 07/29/2012   Procedure: ATRIAL FIBRILLATION ABLATION;  Surgeon: [name], MD;  Location: [facility] CATH LAB;  Service: Cardiovascular;  Laterality: N/A;  . CARDIAC CATHETERIZATION N/A 04/10/2016   Procedure: Right/Left Heart Cath and Coronary Angiography;  Surgeon: Jettie Booze, MD;  Location: [place] CV LAB;  Service: Cardiovascular;  Laterality: N/A;  . CERVICAL FUSION    . CESAREAN SECTION     x 2  . CHOLECYSTECTOMY    . CLIPPING OF ATRIAL APPENDAGE  05/28/2016   Procedure: CLIPPING OF ATRIAL APPENDAGE;  Surgeon: Rexene Alberts, MD;  Location: [place];  Service: Open Heart Surgery;;  . COLONOSCOPY  07/16/2011   diverticulosis  . ELBOW SURGERY     Right  . EP IMPLANTABLE DEVICE N/A 09/10/2015   Procedure: Loop Recorder Insertion;  Surgeon: [name], MD;  Location: [place] CV LAB;  Service: Cardiovascular;  Laterality: N/A;  . EP IMPLANTABLE DEVICE N/A 06/08/2016   MDT Marcelino Scot CRT-P implanted by Dr Curt Bears  . FLEXIBLE SIGMOIDOSCOPY     1990's  . FOOT SURGERY     Right   . I&D KNEE WITH POLY EXCHANGE Left 12/12/2012   Procedure: LEFT KNEE EXCISION SAPHENOUS NEUROMA/OPEN SCAR  DEBRIDEMENT/POLY EXCHANGE/NERVE EXCISION;  Surgeon: Mauri Pole, MD;  Location: [facility];  Service: Orthopedics;  Laterality: Left;  . JOINT REPLACEMENT Bilateral    4'14  knees  . KNEE SURGERY     x 9 Left Knee  . MAZE  05/28/2016   Procedure: MAZE;  Surgeon: Rexene Alberts, MD;  Location: [place];  Service: Open Heart Surgery;;  . MEDIASTERNOTOMY N/A 05/28/2016   Procedure: MEDIAN STERNOTOMY;  Surgeon: Rexene Alberts, MD;  Location: [place];  Service: Open Heart Surgery;  Laterality: N/A;  . SHOULDER SURGERY     Right   . TEE WITHOUT CARDIOVERSION  12/17/2011   Procedure: TRANSESOPHAGEAL ECHOCARDIOGRAM (TEE);  Surgeon: Larey Dresser, MD;  Location: [facility];  Service: Cardiovascular;  Laterality: N/A;  . TEE WITHOUT CARDIOVERSION  07/28/2012   Procedure: TRANSESOPHAGEAL ECHOCARDIOGRAM (TEE);  Surgeon: Thayer Headings, MD;  Location: [place];  Service: Cardiovascular;  Laterality: N/A;  . TEE WITHOUT CARDIOVERSION N/A 01/01/2016   Procedure: TRANSESOPHAGEAL ECHOCARDIOGRAM (TEE);  Surgeon: Sanda Klein, MD;  Location: [facility] ENDOSCOPY;  Service: Cardiovascular;  Laterality: N/A;  . TEE WITHOUT CARDIOVERSION N/A 05/28/2016   Procedure: TRANSESOPHAGEAL ECHOCARDIOGRAM (TEE);  Surgeon: Rexene Alberts, MD;  Location: [place];  Service: Open Heart Surgery;  Laterality: N/A;  . TOTAL HIP ARTHROPLASTY Left 10/17/2013   Procedure: LEFT TOTAL HIP ARTHROPLASTY ANTERIOR APPROACH;  Surgeon: Mauri Pole, MD;  Location: [facility];  Service: Orthopedics;  Laterality: Left;  . TOTAL KNEE ARTHROPLASTY Right 12/12/2012   Procedure: RIGHT TOTAL KNEE ARTHROPLASTY;  Surgeon: Mauri Pole, MD;  Location: [facility];  Service: Orthopedics;  Laterality: Right;  . TUBAL LIGATION    . UPPER GASTROINTESTINAL ENDOSCOPY  01/30/2010   hiatal hernia, fundic gland polyps  . WRIST SURGERY     Left     SOCIAL HISTORY: Social History  Substance Use Topics  . Smoking status: Never Smoker  . Smokeless tobacco: Never Used    . Alcohol use Yes     Comment: rare social    FAMILY HISTORY: Family History  Problem Relation Age of Onset  . Diabetes Mother   . Hyperlipidemia Mother   . Liver disease Father   . Obesity Father   . Diabetes Maternal Grandfather   . Diverticulitis Brother        x 2  . Diverticulitis Paternal Grandmother   . Colon cancer Neg Hx     ROS: Review of Systems  Respiratory: Negative for shortness of breath.   Cardiovascular: Negative for chest pain and claudication.  Gastrointestinal: Negative for nausea and vomiting.  Musculoskeletal: Negative.        Muscle weakness    PHYSICAL EXAM: Blood pressure 126/77, pulse 60, temperature 98.1 F (36.7 C), temperature source Oral, height 5\' 11"  (1.803 m), weight (P) 262 lb (118.8 kg), last menstrual period 04/17/2012, SpO2 98 %. Body mass index is 36.54 kg/m (pended). Physical Exam  Constitutional: She is oriented to person, place, and time. She appears well-developed and well-nourished.  Cardiovascular: Normal rate.   Pulmonary/Chest: Effort normal.  Neurological: She is alert and oriented to person, place, and time.  Skin: Skin is warm and dry.  Psychiatric: She has a normal mood and affect.    RECENT LABS AND TESTS: BMET    Component Value Date/Time   NA 139 01/18/2017 1221   K 4.3 01/18/2017 1221   CL 100 01/18/2017 1221   CO2 23 01/18/2017 1221   GLUCOSE 94 01/18/2017 1221   GLUCOSE 117 (H) 06/24/2016 1343   BUN 14 01/18/2017 1221   CREATININE 0.77 01/18/2017 1221   CREATININE 0.91 06/24/2016 1343   CALCIUM 9.6 01/18/2017 1221   GFRNONAA 88 01/18/2017 1221   GFRAA 102 01/18/2017 1221   Lab Results  Component Value Date   HGBA1C 5.5 01/18/2017   HGBA1C 5.4 05/26/2016   Lab Results  Component Value Date   INSULIN 15.0 01/18/2017   CBC    Component Value Date/Time   WBC 6.8 01/18/2017 1221   WBC 8.5 06/24/2016 1343   RBC 4.44 01/18/2017 1221   RBC 3.95 06/24/2016 1343   HGB 11.9 01/18/2017 1221   HCT  36.6 01/18/2017 1221   PLT 458 (H)  06/24/2016 1343   MCV 82 01/18/2017 1221   MCH 26.8 01/18/2017 1221   MCH 26.8 (L) 06/24/2016 1343   MCHC 32.5 01/18/2017 1221   MCHC 31.5 (L) 06/24/2016 1343   RDW 14.9 01/18/2017 1221   LYMPHSABS 1.6 01/18/2017 1221   MONOABS 497 12/24/2015 0922   EOSABS 0.2 01/18/2017 1221   BASOSABS 0.0 01/18/2017 1221   Iron/TIBC/Ferritin/ %Sat No results found for: IRON, TIBC, FERRITIN, IRONPCTSAT Lipid Panel     Component Value Date/Time   CHOL 194 01/18/2017 1221   TRIG 121 01/18/2017 1221   HDL 60 01/18/2017 1221   CHOLHDL 4 06/13/2014 1022   VLDL 38.2 06/13/2014 1022   LDLCALC 110 (H) 01/18/2017 1221   LDLDIRECT 139.5 11/15/2006 1437   Hepatic Function Panel     Component Value Date/Time   PROT 7.3 01/18/2017 1221   ALBUMIN 4.7 01/18/2017 1221   AST 21 01/18/2017 1221   ALT 26 01/18/2017 1221   ALKPHOS 117 01/18/2017 1221   BILITOT 0.4 01/18/2017 1221   BILIDIR 0.1 05/04/2012 0938   IBILI 0.5 04/27/2011 0044      Component Value Date/Time   TSH 1.650 01/18/2017 1221   TSH 2.217 08/21/2015 1515   TSH 1.597 05/21/2015 0900   TSH 1.76 06/13/2014 1022   TSH 1.720 10/21/2013 1024    ASSESSMENT AND PLAN: Vitamin D deficiency - Plan: Vitamin D, Ergocalciferol, (DRISDOL) 50000 units CAPS capsule  Hyperlipidemia, unspecified hyperlipidemia type  At risk for heart disease  Class 2 obesity with serious comorbidity and body mass index (BMI) of 36.0 to 36.9 in adult, unspecified obesity type  PLAN:  Vitamin D Deficiency Destiny Dennis was informed that low vitamin D levels contributes to fatigue and are associated with obesity, breast, and colon cancer. She agrees to continue to take prescription Vit D @50 ,000 IU every week and will follow up for routine testing of vitamin D, at least 2-3 times per year. She was informed of the risk of over-replacement of vitamin D and agrees to not increase her dose unless he discusses this with [place]  first.  Hyperlipidemia Destiny Dennis was informed of the American Heart Association Guidelines emphasizing intensive lifestyle modifications as the first line treatment for hyperlipidemia. We discussed many lifestyle modifications today in depth, and Destiny Dennis will continue to work on decreasing saturated fats such as fatty red meat, butter and many fried foods. She will also increase vegetables and lean protein in her diet and continue to work on exercise and weight loss efforts.  Cardiovascular risk counselling Destiny Dennis was given extended (15 minutes) coronary artery disease prevention counseling today. She is 56 y.o. female and has risk factors for heart disease including obesity. We discussed intensive lifestyle modifications today with an emphasis on specific weight loss instructions and strategies. Pt was also informed of the importance of increasing exercise and decreasing saturated fats to help prevent heart disease.  Obesity Destiny Dennis is currently in the action stage of change. As such, her goal is to continue with weight loss efforts She has agreed to follow the Category 3 plan Destiny Dennis has been instructed to work up to a goal of 150 minutes of combined cardio and strengthening exercise per week for weight loss and overall health benefits. We discussed the following Behavioral Modification Stratagies today: increasing lean protein intake and keeping healthy foods in the home.   [place] has agreed to follow up with our clinic in 2 weeks. She was informed of the importance of frequent follow up visits to maximize her  success with intensive lifestyle modifications for her multiple health conditions.   Office: [phone]  /  Fax: [phone]  OBESITY BEHAVIORAL INTERVENTION VISIT  Today's visit was # 3 out of 22.  Starting weight: 277 Starting date: 01/18/17 Today's weight : Weight: (P) 262 lb (118.8 kg)  Today's date: 03/04/2017 Total lbs lost to date: 15 (Patients must lose 7 lbs in the first 6  months to continue with counseling)   ASK: We discussed the diagnosis of obesity with Destiny Dennis today and Destiny Dennis agreed to give [place] permission to discuss obesity behavioral modification therapy today.  ASSESS: Destiny Dennis has the diagnosis of obesity and her BMI today is 36.7 Destiny Dennis is in the action stage of change   ADVISE: [place] was educated on the multiple health risks of obesity as well as the benefit of weight loss to improve her health. She was advised of the need for long term treatment and the importance of lifestyle modifications.  AGREE: Multiple dietary modification options and treatment options were discussed and  Destiny Dennis agreed to follow the Category 3 plan We discussed the following Behavioral Modification Stratagies today: increasing lean protein intake and keeping healthy foods in the home.     I have reviewed the above documentation for accuracy and completeness, and I agree with the above. -Lacy Duverney, PA-C  I have reviewed the above note and agree with the plan. -Dennard Nip, MD

## 2017-03-17 ENCOUNTER — Ambulatory Visit (INDEPENDENT_AMBULATORY_CARE_PROVIDER_SITE_OTHER): Payer: 59 | Admitting: Physician Assistant

## 2017-03-17 VITALS — BP 123/76 | HR 68 | Temp 98.1°F | Ht 71.0 in | Wt 261.0 lb

## 2017-03-17 DIAGNOSIS — Z9189 Other specified personal risk factors, not elsewhere classified: Secondary | ICD-10-CM

## 2017-03-17 DIAGNOSIS — E669 Obesity, unspecified: Secondary | ICD-10-CM | POA: Diagnosis not present

## 2017-03-17 DIAGNOSIS — E559 Vitamin D deficiency, unspecified: Secondary | ICD-10-CM | POA: Diagnosis not present

## 2017-03-17 DIAGNOSIS — IMO0001 Reserved for inherently not codable concepts without codable children: Secondary | ICD-10-CM

## 2017-03-17 DIAGNOSIS — I10 Essential (primary) hypertension: Secondary | ICD-10-CM

## 2017-03-17 DIAGNOSIS — Z6836 Body mass index (BMI) 36.0-36.9, adult: Secondary | ICD-10-CM

## 2017-03-17 MED ORDER — VITAMIN D (ERGOCALCIFEROL) 1.25 MG (50000 UNIT) PO CAPS: 50000.0000 [IU] | ORAL_CAPSULE | ORAL | 0 refills | Status: DC

## 2017-03-17 NOTE — Progress Notes (Signed)
Office: [phone]  /  Fax: [phone]   HPI:   Chief Complaint: OBESITY Destiny Dennis is here to discuss her progress with her obesity treatment plan. She is on the  follow the Category 3 plan and is following her eating plan approximately 99 % of the time. She states she is swimming for 30 minutes 3 to 4 times per week. Destiny Dennis continues to do well with weight loss. She traveled and got off track as she didn't plan ahead but she made smarter choices and controlled her portions. Her weight is 261 lb (118.4 kg) today and has had a weight loss of 1 pound over a period of 2 weeks since her last visit. She has lost 16 lbs since starting treatment with [place].  Vitamin D deficiency Destiny Dennis has a diagnosis of vitamin D deficiency. She is currently taking vit D and denies nausea, vomiting or muscle weakness.  Hypertension Destiny Dennis is a 56 y.o. female with hypertension.  Destiny Dennis denies chest pain or shortness of breath on exertion. She is working weight loss to help control her blood pressure with the goal of decreasing her risk of heart attack and stroke. Destiny Dennis blood pressure is currently controlled.  At risk for cardiovascular disease Destiny Dennis is at a higher than average risk for cardiovascular disease due to obesity and hypertension. She currently denies any chest pain.   ALLERGIES: Allergies  Allergen Reactions  . Avelox [Moxifloxacin Hcl In Nacl] Other (See Comments)    Numbness & tingling Peripheral neuropathy    MEDICATIONS: Current Outpatient Prescriptions on File Prior to Visit  Medication Sig Dispense Refill  . acetaminophen (TYLENOL) 325 MG tablet Take 1-2 tablets (325-650 mg total) by mouth every 6 (six) hours as needed for mild pain or headache (For pain.).    Marland Kitchen amoxicillin (AMOXIL) 500 MG capsule Take 2,000 mg by mouth daily as needed. Take 1 hour prior to dental appointment.  4  . Cholecalciferol (VITAMIN D PO) Take 1 capsule by mouth daily.    . diazepam  (VALIUM) 5 MG tablet Take 5 mg by mouth every 6 (six) hours as needed for muscle spasms.     . metoprolol succinate (TOPROL-XL) 100 MG 24 hr tablet TAKE ONE TABLET BY MOUTH IN THE AM AND HALF A TABLET IN THE PM DAILY Take with or immediately following a meal.    . metoprolol succinate (TOPROL-XL) 100 MG 24 hr tablet TAKE 1 TABLET TWICE A DAY WITH OR IMMEDIATELY FOLLOWING A MEAL 180 tablet 2  . Multiple Vitamin (MULTIVITAMIN) capsule Take 1 capsule by mouth daily.    . Omega-3 Fatty Acids (FISH OIL PO) Take 1 capsule by mouth daily.     . pantoprazole (PROTONIX) 40 MG tablet TAKE 1 TABLET (40 MG TOTAL) BY MOUTH DAILY AS NEEDED (HEARTBURN). 90 tablet 1  . rivaroxaban (XARELTO) 20 MG TABS tablet Take 1 tablet (20 mg total) by mouth at bedtime. 30 tablet 11  . Vitamin D, Ergocalciferol, (DRISDOL) 50000 units CAPS capsule Take 1 capsule (50,000 Units total) by mouth every 7 (seven) days. 4 capsule 0  . [DISCONTINUED] flecainide (TAMBOCOR) 100 MG tablet Take 1 tablet (100 mg total) by mouth 2 (two) times daily. 60 tablet 12   No current facility-administered medications on file prior to visit.     PAST MEDICAL HISTORY: Past Medical History:  Diagnosis Date  . Allergic rhinitis   . Anxiety    when tachycardia occurs  . Arthritis    OA, s/p numerous  surgeries -back, knees, hip  . Atrial flutter ([place])   . Atrial septal defect 01/01/2016   Discovered on TEE   . Benign fundic gland polyps of stomach   . Cervical disc disease   . Complication of anesthesia   . Diverticulitis    CT Scan   . DVT (deep venous thrombosis) ([place])    in pregnancy, s/p hip surgery  . Eustachian tube dysfunction   . Fatty liver   . Gallbladder problem   . GERD 12/23/2009   diet related-not a problem  . Headache(784.0)    migraines occ.  Marland Kitchen Hemorrhoids   . Hiatal hernia    small  . History of MRSA infection 2008   superficial skin-cleared easily with doxycycline  . Hypertension   . Left ankle pain   . Left knee  pain   . Obesity   . Peripheral vascular disease ([place])    left leg after hip surgery  . Persistent atrial fibrillation ([place]) 03/05/2007   a. s/p RFCA 12/18/2011, 07/29/12  . PONV (postoperative nausea and vomiting)   . s/p atrial septal defect repair 05/28/2016  . S/P Maze operation for atrial fibrillation 05/28/2016   Complete bilateral atrial lesion set using cryothermy and bipolar radiofrequency ablation with clipping of LA appendage via median sternotomy  . Sialoadenitis   . SVT (supraventricular tachycardia) ([place]) 03/18/2010   PVCs  . Tachy-brady syndrome ([place])     PAST SURGICAL HISTORY: Past Surgical History:  Procedure Laterality Date  . ASD REPAIR N/A 05/28/2016   Procedure: ATRIAL SEPTAL DEFECT (ASD) closure;  Surgeon: Rexene Alberts, MD;  Location: [place];  Service: Open Heart Surgery;  Laterality: N/A;  . ATRIAL FIBRILLATION ABLATION N/A 12/18/2011   Procedure: ATRIAL FIBRILLATION ABLATION;  Surgeon: [name], MD;  Location: [facility] CATH LAB;  Service: Cardiovascular;  Laterality: N/A;  . ATRIAL FIBRILLATION ABLATION N/A 07/29/2012   Procedure: ATRIAL FIBRILLATION ABLATION;  Surgeon: [name], MD;  Location: [facility] CATH LAB;  Service: Cardiovascular;  Laterality: N/A;  . CARDIAC CATHETERIZATION N/A 04/10/2016   Procedure: Right/Left Heart Cath and Coronary Angiography;  Surgeon: Jettie Booze, MD;  Location: [place] CV LAB;  Service: Cardiovascular;  Laterality: N/A;  . CERVICAL FUSION    . CESAREAN SECTION     x 2  . CHOLECYSTECTOMY    . CLIPPING OF ATRIAL APPENDAGE  05/28/2016   Procedure: CLIPPING OF ATRIAL APPENDAGE;  Surgeon: Rexene Alberts, MD;  Location: [place];  Service: Open Heart Surgery;;  . COLONOSCOPY  07/16/2011   diverticulosis  . ELBOW SURGERY     Right  . EP IMPLANTABLE DEVICE N/A 09/10/2015   Procedure: Loop Recorder Insertion;  Surgeon: [name], MD;  Location: [place] CV LAB;  Service: Cardiovascular;  Laterality: N/A;  . EP IMPLANTABLE  DEVICE N/A 06/08/2016   MDT Marcelino Scot CRT-P implanted by Dr Curt Bears  . FLEXIBLE SIGMOIDOSCOPY     1990's  . FOOT SURGERY     Right   . I&D KNEE WITH POLY EXCHANGE Left 12/12/2012   Procedure: LEFT KNEE EXCISION SAPHENOUS NEUROMA/OPEN SCAR DEBRIDEMENT/POLY EXCHANGE/NERVE EXCISION;  Surgeon: Mauri Pole, MD;  Location: [facility];  Service: Orthopedics;  Laterality: Left;  . JOINT REPLACEMENT Bilateral    4'14  knees  . KNEE SURGERY     x 9 Left Knee  . MAZE  05/28/2016   Procedure: MAZE;  Surgeon: Rexene Alberts, MD;  Location: [place];  Service: Open Heart Surgery;;  . Katheran James  N/A 05/28/2016   Procedure: MEDIAN STERNOTOMY;  Surgeon: Rexene Alberts, MD;  Location: [place];  Service: Open Heart Surgery;  Laterality: N/A;  . SHOULDER SURGERY     Right   . TEE WITHOUT CARDIOVERSION  12/17/2011   Procedure: TRANSESOPHAGEAL ECHOCARDIOGRAM (TEE);  Surgeon: Larey Dresser, MD;  Location: [facility] ENDOSCOPY;  Service: Cardiovascular;  Laterality: N/A;  . TEE WITHOUT CARDIOVERSION  07/28/2012   Procedure: TRANSESOPHAGEAL ECHOCARDIOGRAM (TEE);  Surgeon: Thayer Headings, MD;  Location: [place];  Service: Cardiovascular;  Laterality: N/A;  . TEE WITHOUT CARDIOVERSION N/A 01/01/2016   Procedure: TRANSESOPHAGEAL ECHOCARDIOGRAM (TEE);  Surgeon: Sanda Klein, MD;  Location: [facility] ENDOSCOPY;  Service: Cardiovascular;  Laterality: N/A;  . TEE WITHOUT CARDIOVERSION N/A 05/28/2016   Procedure: TRANSESOPHAGEAL ECHOCARDIOGRAM (TEE);  Surgeon: Rexene Alberts, MD;  Location: [place];  Service: Open Heart Surgery;  Laterality: N/A;  . TOTAL HIP ARTHROPLASTY Left 10/17/2013   Procedure: LEFT TOTAL HIP ARTHROPLASTY ANTERIOR APPROACH;  Surgeon: Mauri Pole, MD;  Location: [facility];  Service: Orthopedics;  Laterality: Left;  . TOTAL KNEE ARTHROPLASTY Right 12/12/2012   Procedure: RIGHT TOTAL KNEE ARTHROPLASTY;  Surgeon: Mauri Pole, MD;  Location: [facility];  Service: Orthopedics;  Laterality: Right;  . TUBAL  LIGATION    . UPPER GASTROINTESTINAL ENDOSCOPY  01/30/2010   hiatal hernia, fundic gland polyps  . WRIST SURGERY     Left     SOCIAL HISTORY: Social History  Substance Use Topics  . Smoking status: Never Smoker  . Smokeless tobacco: Never Used  . Alcohol use Yes     Comment: rare social    FAMILY HISTORY: Family History  Problem Relation Age of Onset  . Diabetes Mother   . Hyperlipidemia Mother   . Liver disease Father   . Obesity Father   . Diabetes Maternal Grandfather   . Diverticulitis Brother        x 2  . Diverticulitis Paternal Grandmother   . Colon cancer Neg Hx     ROS: Review of Systems  Constitutional: Positive for weight loss.  Respiratory: Negative for shortness of breath (on exertion).   Cardiovascular: Negative for chest pain.  Gastrointestinal: Negative for nausea and vomiting.  Musculoskeletal:       Negative muscle weakness    PHYSICAL EXAM: Blood pressure 123/76, pulse 68, temperature 98.1 F (36.7 C), temperature source Oral, height 5\' 11"  (1.803 m), weight 261 lb (118.4 kg), last menstrual period 04/17/2012, SpO2 100 %. Body mass index is 36.4 kg/m. Physical Exam  Constitutional: She is oriented to person, place, and time. She appears well-developed and well-nourished.  Cardiovascular: Normal rate.   Pulmonary/Chest: Effort normal.  Musculoskeletal: Normal range of motion.  Neurological: She is oriented to person, place, and time.  Skin: Skin is warm and dry.  Psychiatric: She has a normal mood and affect. Her behavior is normal.  Vitals reviewed.   RECENT LABS AND TESTS: BMET    Component Value Date/Time   NA 139 01/18/2017 1221   K 4.3 01/18/2017 1221   CL 100 01/18/2017 1221   CO2 23 01/18/2017 1221   GLUCOSE 94 01/18/2017 1221   GLUCOSE 117 (H) 06/24/2016 1343   BUN 14 01/18/2017 1221   CREATININE 0.77 01/18/2017 1221   CREATININE 0.91 06/24/2016 1343   CALCIUM 9.6 01/18/2017 1221   GFRNONAA 88 01/18/2017 1221   GFRAA 102  01/18/2017 1221   Lab Results  Component Value Date   HGBA1C 5.5 01/18/2017  HGBA1C 5.4 05/26/2016   Lab Results  Component Value Date   INSULIN 15.0 01/18/2017   CBC    Component Value Date/Time   WBC 6.8 01/18/2017 1221   WBC 8.5 06/24/2016 1343   RBC 4.44 01/18/2017 1221   RBC 3.95 06/24/2016 1343   HGB 11.9 01/18/2017 1221   HCT 36.6 01/18/2017 1221   PLT 458 (H) 06/24/2016 1343   MCV 82 01/18/2017 1221   MCH 26.8 01/18/2017 1221   MCH 26.8 (L) 06/24/2016 1343   MCHC 32.5 01/18/2017 1221   MCHC 31.5 (L) 06/24/2016 1343   RDW 14.9 01/18/2017 1221   LYMPHSABS 1.6 01/18/2017 1221   MONOABS 497 12/24/2015 0922   EOSABS 0.2 01/18/2017 1221   BASOSABS 0.0 01/18/2017 1221   Iron/TIBC/Ferritin/ %Sat No results found for: IRON, TIBC, FERRITIN, IRONPCTSAT Lipid Panel     Component Value Date/Time   CHOL 194 01/18/2017 1221   TRIG 121 01/18/2017 1221   HDL 60 01/18/2017 1221   CHOLHDL 4 06/13/2014 1022   VLDL 38.2 06/13/2014 1022   LDLCALC 110 (H) 01/18/2017 1221   LDLDIRECT 139.5 11/15/2006 1437   Hepatic Function Panel     Component Value Date/Time   PROT 7.3 01/18/2017 1221   ALBUMIN 4.7 01/18/2017 1221   AST 21 01/18/2017 1221   ALT 26 01/18/2017 1221   ALKPHOS 117 01/18/2017 1221   BILITOT 0.4 01/18/2017 1221   BILIDIR 0.1 05/04/2012 0938   IBILI 0.5 04/27/2011 0044      Component Value Date/Time   TSH 1.650 01/18/2017 1221   TSH 2.217 08/21/2015 1515   TSH 1.597 05/21/2015 0900   TSH 1.76 06/13/2014 1022   TSH 1.720 10/21/2013 1024    ASSESSMENT AND PLAN: Vitamin D deficiency - Plan: Vitamin D, Ergocalciferol, (DRISDOL) 50000 units CAPS capsule  Essential hypertension  At risk for heart disease  Class 2 obesity with serious comorbidity and body mass index (BMI) of 36.0 to 36.9 in adult, unspecified obesity type  PLAN:   Vitamin D Deficiency Paysen was informed that low vitamin D levels contributes to fatigue and are associated with  obesity, breast, and colon cancer. She agrees to continue to take prescription Vit D @50 ,000 IU every week, we will refill for 1 month and will follow up for routine testing of vitamin D, at least 2-3 times per year. She was informed of the risk of over-replacement of vitamin D and agrees to not increase her dose unless he discusses this with [place] first. Sadiyah agrees to follow up with our clinic in 2 weeks.  Hypertension We discussed sodium restriction, working on healthy weight loss, and a regular exercise program as the means to achieve improved blood pressure control. Amalya agreed with this plan and agreed to follow up as directed. We will continue to monitor her blood pressure as well as her progress with the above lifestyle modifications. She will continue her medications as prescribed and will watch for signs of hypotension as she continues her lifestyle modifications.  Cardiovascular risk counselling Kashlynn was given extended (15 minutes) coronary artery disease prevention counseling today. She is 56 y.o. female and has risk factors for heart disease including obesity and hypertension. We discussed intensive lifestyle modifications today with an emphasis on specific weight loss instructions and strategies. Pt was also informed of the importance of increasing exercise and decreasing saturated fats to help prevent heart disease.  Obesity Mashonda is currently in the action stage of change. As such, her goal is to continue  with weight loss efforts She has agreed to follow the Category 3 plan Ed has been instructed to work up to a goal of 150 minutes of combined cardio and strengthening exercise per week for weight loss and overall health benefits. We discussed the following Behavioral Modification Strategies today: increasing lean protein intake, better snacking choices and travel eating strategies  Lizbett has agreed to follow up with our clinic in 2 weeks. She was informed of the importance of  frequent follow up visits to maximize her success with intensive lifestyle modifications for her multiple health conditions.  I, Doreene Nest, am acting as transcriptionist for Lacy Duverney, PA-C  I have reviewed the above documentation for accuracy and completeness, and I agree with the above. -Lacy Duverney, PA-C  I have reviewed the above note and agree with the plan. -Dennard Nip, MD   OBESITY BEHAVIORAL INTERVENTION VISIT  Today's visit was # 4 out of 22.  Starting weight: 277 lbs Starting date: 01/18/17 Today's weight : 261 lbs Today's date: 03/17/2017 Total lbs lost to date: 16 (Patients must lose 7 lbs in the first 6 months to continue with counseling)   ASK: We discussed the diagnosis of obesity with [name] today and Verania agreed to give [place] permission to discuss obesity behavioral modification therapy today.  ASSESS: Nadene has the diagnosis of obesity and her BMI today is 36.5 [place] is in the action stage of change   ADVISE: Jilliann was educated on the multiple health risks of obesity as well as the benefit of weight loss to improve her health. She was advised of the need for long term treatment and the importance of lifestyle modifications.  AGREE: Multiple dietary modification options and treatment options were discussed and  Jianni agreed to follow the Category 3 plan We discussed the following Behavioral Modification Strategies today: increasing lean protein intake, better snacking choices and travel eating strategies

## 2017-03-31 ENCOUNTER — Ambulatory Visit (INDEPENDENT_AMBULATORY_CARE_PROVIDER_SITE_OTHER): Payer: 59 | Admitting: Physician Assistant

## 2017-03-31 ENCOUNTER — Encounter (INDEPENDENT_AMBULATORY_CARE_PROVIDER_SITE_OTHER): Payer: Self-pay

## 2017-04-05 ENCOUNTER — Ambulatory Visit (INDEPENDENT_AMBULATORY_CARE_PROVIDER_SITE_OTHER): Payer: 59 | Admitting: Physician Assistant

## 2017-04-05 VITALS — BP 101/68 | HR 66 | Temp 98.0°F | Ht 71.0 in | Wt 260.0 lb

## 2017-04-05 DIAGNOSIS — Z6836 Body mass index (BMI) 36.0-36.9, adult: Secondary | ICD-10-CM

## 2017-04-05 DIAGNOSIS — IMO0001 Reserved for inherently not codable concepts without codable children: Secondary | ICD-10-CM

## 2017-04-05 DIAGNOSIS — E669 Obesity, unspecified: Secondary | ICD-10-CM | POA: Diagnosis not present

## 2017-04-05 DIAGNOSIS — I10 Essential (primary) hypertension: Secondary | ICD-10-CM | POA: Diagnosis not present

## 2017-04-05 NOTE — Progress Notes (Addendum)
Office: [phone]  /  Fax: [phone]   HPI:   Chief Complaint: OBESITY Destiny Dennis is here to discuss her progress with her obesity treatment plan. She is on the Category 3 plan and is following her eating plan approximately 90 % of the time. She states she is exercising 0 minutes 0 times per week. Destiny Dennis continues to do well with weight loss. She has had an increase in celebration eating and has not been eating all the protein. She is ready to get back on track. Her weight is 260 lb (117.9 kg) today and has had a weight loss of 1 pound over a period of 2 weeks since her last visit. She has lost 17 lbs since starting treatment with [facility].  Hypertension Destiny Dennis is a 56 y.o. female with hypertension. She is followed by her cardiologist and Destiny Dennis denies chest pain or headache. She is working weight loss to help control her blood pressure with the goal of decreasing her risk of heart attack and stroke. [name] blood pressure is currently stable.   ALLERGIES: Allergies  Allergen Reactions  . Avelox [Moxifloxacin Hcl In Nacl] Other (See Comments)    Numbness & tingling Peripheral neuropathy    MEDICATIONS: Current Outpatient Prescriptions on File Prior to Visit  Medication Sig Dispense Refill  . acetaminophen (TYLENOL) 325 MG tablet Take 1-2 tablets (325-650 mg total) by mouth every 6 (six) hours as needed for mild pain or headache (For pain.).    Marland Kitchen amoxicillin (AMOXIL) 500 MG capsule Take 2,000 mg by mouth daily as needed. Take 1 hour prior to dental appointment.  4  . Cholecalciferol (VITAMIN D PO) Take 1 capsule by mouth daily.    . diazepam (VALIUM) 5 MG tablet Take 5 mg by mouth every 6 (six) hours as needed for muscle spasms.     . metoprolol succinate (TOPROL-XL) 100 MG 24 hr tablet TAKE ONE TABLET BY MOUTH IN THE AM AND HALF A TABLET IN THE PM DAILY Take with or immediately following a meal.    . metoprolol succinate (TOPROL-XL) 100 MG 24 hr tablet TAKE 1 TABLET  TWICE A DAY WITH OR IMMEDIATELY FOLLOWING A MEAL 180 tablet 2  . Multiple Vitamin (MULTIVITAMIN) capsule Take 1 capsule by mouth daily.    . Omega-3 Fatty Acids (FISH OIL PO) Take 1 capsule by mouth daily.     . pantoprazole (PROTONIX) 40 MG tablet TAKE 1 TABLET (40 MG TOTAL) BY MOUTH DAILY AS NEEDED (HEARTBURN). 90 tablet 1  . rivaroxaban (XARELTO) 20 MG TABS tablet Take 1 tablet (20 mg total) by mouth at bedtime. 30 tablet 11  . Vitamin D, Ergocalciferol, (DRISDOL) 50000 units CAPS capsule Take 1 capsule (50,000 Units total) by mouth every 7 (seven) days. 4 capsule 0  . [DISCONTINUED] flecainide (TAMBOCOR) 100 MG tablet Take 1 tablet (100 mg total) by mouth 2 (two) times daily. 60 tablet 12   No current facility-administered medications on file prior to visit.     PAST MEDICAL HISTORY: Past Medical History:  Diagnosis Date  . Allergic rhinitis   . Anxiety    when tachycardia occurs  . Arthritis    OA, s/p numerous surgeries -back, knees, hip  . Atrial flutter ([facility])   . Atrial septal defect 01/01/2016   Discovered on TEE   . Benign fundic gland polyps of stomach   . Cervical disc disease   . Complication of anesthesia   . Diverticulitis    CT Scan   .  DVT (deep venous thrombosis) (Destiny Dennis)    in pregnancy, s/p hip surgery  . Eustachian tube dysfunction   . Fatty liver   . Gallbladder problem   . GERD 12/23/2009   diet related-not a problem  . Headache(784.0)    migraines occ.  Marland Kitchen Hemorrhoids   . Hiatal hernia    small  . History of MRSA infection 2008   superficial skin-cleared easily with doxycycline  . Hypertension   . Left ankle pain   . Left knee pain   . Obesity   . Peripheral vascular disease (Destiny Dennis)    left leg after hip surgery  . Persistent atrial fibrillation (Destiny Dennis) 03/05/2007   a. s/p RFCA 12/18/2011, 07/29/12  . PONV (postoperative nausea and vomiting)   . s/p atrial septal defect repair 05/28/2016  . S/P Maze operation for atrial fibrillation 05/28/2016   Complete  bilateral atrial lesion set using cryothermy and bipolar radiofrequency ablation with clipping of LA appendage via median sternotomy  . Sialoadenitis   . SVT (supraventricular tachycardia) (Destiny Dennis) 03/18/2010   PVCs  . Tachy-brady syndrome (Destiny Dennis)     PAST SURGICAL HISTORY: Past Surgical History:  Procedure Laterality Date  . ASD REPAIR N/A 05/28/2016   Procedure: ATRIAL SEPTAL DEFECT (ASD) closure;  Surgeon: Rexene Alberts, MD;  Location: [place];  Service: Open Heart Surgery;  Laterality: N/A;  . ATRIAL FIBRILLATION ABLATION N/A 12/18/2011   Procedure: ATRIAL FIBRILLATION ABLATION;  Surgeon: [name], MD;  Location: [facility] CATH LAB;  Service: Cardiovascular;  Laterality: N/A;  . ATRIAL FIBRILLATION ABLATION N/A 07/29/2012   Procedure: ATRIAL FIBRILLATION ABLATION;  Surgeon: [name], MD;  Location: [facility];  Service: Cardiovascular;  Laterality: N/A;  . CARDIAC CATHETERIZATION N/A 04/10/2016   Procedure: Right/Left Heart Cath and Coronary Angiography;  Surgeon: Destiny Booze, MD;  Location: [place] CV LAB;  Service: Cardiovascular;  Laterality: N/A;  . CERVICAL FUSION    . CESAREAN SECTION     x 2  . CHOLECYSTECTOMY    . CLIPPING OF ATRIAL APPENDAGE  05/28/2016   Procedure: CLIPPING OF ATRIAL APPENDAGE;  Surgeon: Rexene Alberts, MD;  Location: [place];  Service: Open Heart Surgery;;  . COLONOSCOPY  07/16/2011   diverticulosis  . ELBOW SURGERY     Right  . EP IMPLANTABLE DEVICE N/A 09/10/2015   Procedure: Loop Recorder Insertion;  Surgeon: [name], MD;  Location: [facility];  Service: Cardiovascular;  Laterality: N/A;  . EP IMPLANTABLE DEVICE N/A 06/08/2016   MDT Marcelino Scot CRT-P implanted by Dr Curt Bears  . FLEXIBLE SIGMOIDOSCOPY     1990's  . FOOT SURGERY     Right   . I&D KNEE WITH POLY EXCHANGE Left 12/12/2012   Procedure: LEFT KNEE EXCISION SAPHENOUS NEUROMA/OPEN SCAR DEBRIDEMENT/POLY EXCHANGE/NERVE EXCISION;  Surgeon: Destiny Pole, MD;  Location: [facility];  Service: Orthopedics;  Laterality: Left;  . JOINT REPLACEMENT Bilateral    4'14  knees  . KNEE SURGERY     x 9 Left Knee  . MAZE  05/28/2016   Procedure: MAZE;  Surgeon: Rexene Alberts, MD;  Location: [place];  Service: Open Heart Surgery;;  . MEDIASTERNOTOMY N/A 05/28/2016   Procedure: MEDIAN STERNOTOMY;  Surgeon: Rexene Alberts, MD;  Location: [place];  Service: Open Heart Surgery;  Laterality: N/A;  . SHOULDER SURGERY     Right   . TEE WITHOUT CARDIOVERSION  12/17/2011   Procedure: TRANSESOPHAGEAL ECHOCARDIOGRAM (TEE);  Surgeon: Larey Dresser, MD;  Location: [facility];  Service: Cardiovascular;  Laterality: N/A;  . TEE WITHOUT CARDIOVERSION  07/28/2012   Procedure: TRANSESOPHAGEAL ECHOCARDIOGRAM (TEE);  Surgeon: Thayer Headings, MD;  Location: [place];  Service: Cardiovascular;  Laterality: N/A;  . TEE WITHOUT CARDIOVERSION N/A 01/01/2016   Procedure: TRANSESOPHAGEAL ECHOCARDIOGRAM (TEE);  Surgeon: Sanda Klein, MD;  Location: [facility];  Service: Cardiovascular;  Laterality: N/A;  . TEE WITHOUT CARDIOVERSION N/A 05/28/2016   Procedure: TRANSESOPHAGEAL ECHOCARDIOGRAM (TEE);  Surgeon: Rexene Alberts, MD;  Location: [place];  Service: Open Heart Surgery;  Laterality: N/A;  . TOTAL HIP ARTHROPLASTY Left 10/17/2013   Procedure: LEFT TOTAL HIP ARTHROPLASTY ANTERIOR APPROACH;  Surgeon: Destiny Pole, MD;  Location: [facility];  Service: Orthopedics;  Laterality: Left;  . TOTAL KNEE ARTHROPLASTY Right 12/12/2012   Procedure: RIGHT TOTAL KNEE ARTHROPLASTY;  Surgeon: Destiny Pole, MD;  Location: [facility];  Service: Orthopedics;  Laterality: Right;  . TUBAL LIGATION    . UPPER GASTROINTESTINAL ENDOSCOPY  01/30/2010   hiatal hernia, fundic gland polyps  . WRIST SURGERY     Left     SOCIAL HISTORY: Social History  Substance Use Topics  . Smoking status: Never Smoker  . Smokeless tobacco: Never Used  . Alcohol use Yes     Comment: rare social    FAMILY HISTORY: Family History    Problem Relation Age of Onset  . Diabetes Mother   . Hyperlipidemia Mother   . Liver disease Father   . Obesity Father   . Diabetes Maternal Grandfather   . Diverticulitis Brother        x 2  . Diverticulitis Paternal Grandmother   . Colon cancer Neg Hx     ROS: Review of Systems  Constitutional: Positive for weight loss.  Cardiovascular: Negative for chest pain.  Neurological: Negative for headaches.    PHYSICAL EXAM: Blood pressure 101/68, pulse 66, temperature 98 F (36.7 C), temperature source Oral, height 5\' 11"  (1.803 m), weight 260 lb (117.9 kg), last menstrual period 04/17/2012, SpO2 96 %. Body mass index is 36.26 kg/m. Physical Exam  Constitutional: She is oriented to person, place, and time. She appears well-developed and well-nourished.  Cardiovascular: Normal rate.   Pulmonary/Chest: Effort normal.  Musculoskeletal: Normal range of motion.  Neurological: She is oriented to person, place, and time.  Skin: Skin is warm and dry.  Psychiatric: She has a normal mood and affect. Her behavior is normal.  Vitals reviewed.   RECENT LABS AND TESTS: BMET    Component Value Date/Time   NA 139 01/18/2017 1221   K 4.3 01/18/2017 1221   CL 100 01/18/2017 1221   CO2 23 01/18/2017 1221   GLUCOSE 94 01/18/2017 1221   GLUCOSE 117 (H) 06/24/2016 1343   BUN 14 01/18/2017 1221   CREATININE 0.77 01/18/2017 1221   CREATININE 0.91 06/24/2016 1343   CALCIUM 9.6 01/18/2017 1221   GFRNONAA 88 01/18/2017 1221   GFRAA 102 01/18/2017 1221   Lab Results  Component Value Date   HGBA1C 5.5 01/18/2017   HGBA1C 5.4 05/26/2016   Lab Results  Component Value Date   INSULIN 15.0 01/18/2017   CBC    Component Value Date/Time   WBC 6.8 01/18/2017 1221   WBC 8.5 06/24/2016 1343   RBC 4.44 01/18/2017 1221   RBC 3.95 06/24/2016 1343   HGB 11.9 01/18/2017 1221   HCT 36.6 01/18/2017 1221   PLT 458 (H) 06/24/2016 1343   MCV 82 01/18/2017 1221   MCH  26.8 01/18/2017 1221   MCH  26.8 (L) 06/24/2016 1343   MCHC 32.5 01/18/2017 1221   MCHC 31.5 (L) 06/24/2016 1343   RDW 14.9 01/18/2017 1221   LYMPHSABS 1.6 01/18/2017 1221   MONOABS 497 12/24/2015 0922   EOSABS 0.2 01/18/2017 1221   BASOSABS 0.0 01/18/2017 1221   Iron/TIBC/Ferritin/ %Sat No results found for: IRON, TIBC, FERRITIN, IRONPCTSAT Lipid Panel     Component Value Date/Time   CHOL 194 01/18/2017 1221   TRIG 121 01/18/2017 1221   HDL 60 01/18/2017 1221   CHOLHDL 4 06/13/2014 1022   VLDL 38.2 06/13/2014 1022   LDLCALC 110 (H) 01/18/2017 1221   LDLDIRECT 139.5 11/15/2006 1437   Hepatic Function Panel     Component Value Date/Time   PROT 7.3 01/18/2017 1221   ALBUMIN 4.7 01/18/2017 1221   AST 21 01/18/2017 1221   ALT 26 01/18/2017 1221   ALKPHOS 117 01/18/2017 1221   BILITOT 0.4 01/18/2017 1221   BILIDIR 0.1 05/04/2012 0938   IBILI 0.5 04/27/2011 0044      Component Value Date/Time   TSH 1.650 01/18/2017 1221   TSH 2.217 08/21/2015 1515   TSH 1.597 05/21/2015 0900   TSH 1.76 06/13/2014 1022   TSH 1.720 10/21/2013 1024    ASSESSMENT AND PLAN: Essential hypertension  Class 2 obesity with serious comorbidity and body mass index (BMI) of 36.0 to 36.9 in adult, unspecified obesity type  PLAN:  Hypertension We discussed sodium restriction, working on healthy weight loss, and a regular exercise program as the means to achieve improved blood pressure control. Amica agreed with this plan and agreed to follow up as directed. We will continue to monitor her blood pressure as well as her progress with the above lifestyle modifications. She will continue her medications as prescribed and will decrease her metoprolol as advised by her cardiologist and  . She will watch for signs of hypotension as she continues her lifestyle modifications.  We spent > than 50% of the 15 minute visit on the counseling as documented in the note.   Obesity Destiny Dennis is currently in the action stage of change. As such,  her goal is to continue with weight loss efforts She has agreed to follow the Category 2 plan Ja has been instructed to work up to a goal of 150 minutes of combined cardio and strengthening exercise per week for weight loss and overall health benefits. We discussed the following Behavioral Modification Strategies today: better snacking choices and increasing lean protein intake  Chinwe has agreed to follow up with our clinic in 2 weeks. She was informed of the importance of frequent follow up visits to maximize her success with intensive lifestyle modifications for her multiple health conditions.  Corey Skains, am acting as transcriptionist for Lacy Duverney, [facility]  I have reviewed the above documentation for accuracy and completeness, and I agree with the above. -Lacy Duverney, PA-C   OBESITY BEHAVIORAL INTERVENTION VISIT  Today's visit was # 5 out of 22.  Starting weight: 277 lbs Starting date: 01/18/17 Today's weight : 260 lbs Today's date: 04/05/2017 Total lbs lost to date: 17 (Patients must lose 7 lbs in the first 6 months to continue with counseling)   ASK: We discussed the diagnosis of obesity with Destiny Dennis today and Vivian agreed to give Destiny Dennis permission to discuss obesity behavioral modification therapy today.  ASSESS: Destiny Dennis has the diagnosis of obesity and her BMI today is 36.3 Destiny Dennis is in the action stage of  change   ADVISE: Destiny Dennis was educated on the multiple health risks of obesity as well as the benefit of weight loss to improve her health. She was advised of the need for long term treatment and the importance of lifestyle modifications.  AGREE: Multiple dietary modification options and treatment options were discussed and  Destiny Dennis agreed to follow the Category 2 plan We discussed the following Behavioral Modification Strategies today: better snacking choices and increasing lean protein intake

## 2017-04-07 ENCOUNTER — Telehealth: Payer: Self-pay | Admitting: Cardiology

## 2017-04-07 ENCOUNTER — Ambulatory Visit (INDEPENDENT_AMBULATORY_CARE_PROVIDER_SITE_OTHER): Payer: 59 | Admitting: *Deleted

## 2017-04-07 DIAGNOSIS — I480 Paroxysmal atrial fibrillation: Secondary | ICD-10-CM

## 2017-04-07 NOTE — Telephone Encounter (Signed)
Spoke with pt and reminded pt of remote transmission that is due today. Pt verbalized understanding.   

## 2017-04-08 NOTE — Progress Notes (Signed)
Remote pacemaker transmission.   

## 2017-04-09 LAB — CUP PACEART REMOTE DEVICE CHECK
Battery Remaining Longevity: 114 mo
Brady Statistic AP VS Percent: 1.66 %
Brady Statistic RA Percent Paced: 99.98 %
Brady Statistic RV Percent Paced: 10.8 %
Implantable Lead Implant Date: 20171023
Implantable Lead Implant Date: 20171023
Implantable Lead Location: 753858
Implantable Lead Location: 753860
Implantable Lead Model: 4298
Implantable Lead Model: 5076
Implantable Pulse Generator Implant Date: 20171023
Lead Channel Impedance Value: 285 Ohm
Lead Channel Impedance Value: 304 Ohm
Lead Channel Impedance Value: 342 Ohm
Lead Channel Impedance Value: 437 Ohm
Lead Channel Impedance Value: 513 Ohm
Lead Channel Impedance Value: 551 Ohm
Lead Channel Impedance Value: 570 Ohm
Lead Channel Impedance Value: 912 Ohm
Lead Channel Pacing Threshold Amplitude: 0.5 V
Lead Channel Pacing Threshold Pulse Width: 0.4 ms
Lead Channel Pacing Threshold Pulse Width: 0.4 ms
Lead Channel Sensing Intrinsic Amplitude: 1.875 mV
Lead Channel Sensing Intrinsic Amplitude: 1.875 mV
Lead Channel Sensing Intrinsic Amplitude: 13.75 mV
Lead Channel Setting Pacing Amplitude: 2 V
Lead Channel Setting Pacing Pulse Width: 0.4 ms
Lead Channel Setting Pacing Pulse Width: 0.4 ms
Lead Channel Setting Sensing Sensitivity: 2 mV
MDC IDC LEAD IMPLANT DT: 20171023
MDC IDC LEAD LOCATION: 753859
MDC IDC MSMT BATTERY VOLTAGE: 3.03 V
MDC IDC MSMT LEADCHNL LV IMPEDANCE VALUE: 342 Ohm
MDC IDC MSMT LEADCHNL LV IMPEDANCE VALUE: 361 Ohm
MDC IDC MSMT LEADCHNL LV IMPEDANCE VALUE: 380 Ohm
MDC IDC MSMT LEADCHNL LV IMPEDANCE VALUE: 513 Ohm
MDC IDC MSMT LEADCHNL LV IMPEDANCE VALUE: 589 Ohm
MDC IDC MSMT LEADCHNL LV PACING THRESHOLD AMPLITUDE: 1.375 V
MDC IDC MSMT LEADCHNL LV PACING THRESHOLD PULSEWIDTH: 0.4 ms
MDC IDC MSMT LEADCHNL RA PACING THRESHOLD AMPLITUDE: 0.625 V
MDC IDC MSMT LEADCHNL RV IMPEDANCE VALUE: 1102 Ohm
MDC IDC MSMT LEADCHNL RV SENSING INTR AMPL: 13.75 mV
MDC IDC SESS DTM: 20180820144622
MDC IDC SET LEADCHNL LV PACING AMPLITUDE: 2 V
MDC IDC SET LEADCHNL RV PACING AMPLITUDE: 2.5 V
MDC IDC STAT BRADY AP VP PERCENT: 98.32 %
MDC IDC STAT BRADY AS VP PERCENT: 0.01 %
MDC IDC STAT BRADY AS VS PERCENT: 0.01 %

## 2017-04-16 ENCOUNTER — Encounter: Payer: Self-pay | Admitting: Cardiology

## 2017-04-20 ENCOUNTER — Encounter (INDEPENDENT_AMBULATORY_CARE_PROVIDER_SITE_OTHER): Payer: Self-pay

## 2017-04-20 ENCOUNTER — Other Ambulatory Visit: Payer: Self-pay | Admitting: Obstetrics and Gynecology

## 2017-04-20 ENCOUNTER — Ambulatory Visit (INDEPENDENT_AMBULATORY_CARE_PROVIDER_SITE_OTHER): Payer: 59 | Admitting: Family Medicine

## 2017-04-20 ENCOUNTER — Ambulatory Visit (INDEPENDENT_AMBULATORY_CARE_PROVIDER_SITE_OTHER): Payer: 59 | Admitting: Physician Assistant

## 2017-04-20 DIAGNOSIS — Z1231 Encounter for screening mammogram for malignant neoplasm of breast: Secondary | ICD-10-CM

## 2017-04-27 ENCOUNTER — Ambulatory Visit (INDEPENDENT_AMBULATORY_CARE_PROVIDER_SITE_OTHER): Payer: 59 | Admitting: Physician Assistant

## 2017-04-27 VITALS — BP 116/76 | HR 71 | Temp 98.5°F | Ht 71.0 in | Wt 258.0 lb

## 2017-04-27 DIAGNOSIS — E559 Vitamin D deficiency, unspecified: Secondary | ICD-10-CM | POA: Diagnosis not present

## 2017-04-27 DIAGNOSIS — E669 Obesity, unspecified: Secondary | ICD-10-CM | POA: Diagnosis not present

## 2017-04-27 DIAGNOSIS — Z9189 Other specified personal risk factors, not elsewhere classified: Secondary | ICD-10-CM | POA: Diagnosis not present

## 2017-04-27 DIAGNOSIS — K5909 Other constipation: Secondary | ICD-10-CM

## 2017-04-27 DIAGNOSIS — Z6836 Body mass index (BMI) 36.0-36.9, adult: Secondary | ICD-10-CM

## 2017-04-27 DIAGNOSIS — IMO0001 Reserved for inherently not codable concepts without codable children: Secondary | ICD-10-CM

## 2017-04-27 MED ORDER — POLYETHYLENE GLYCOL 3350 17 GM/SCOOP PO POWD: 17.0000 g | Freq: Every day | ORAL | 0 refills | Status: DC

## 2017-04-27 NOTE — Progress Notes (Signed)
Office: [phone]  /  Fax: [phone]   HPI:   Chief Complaint: OBESITY Destiny Dennis is here to discuss her progress with her obesity treatment plan. She is on the Category 3 plan and is following her eating plan approximately 75 % of the time. She states she is exercising 0 minutes 0 times per week. Destiny Dennis continues to do well with weight loss.  She would like more variety at dinner and would like to incorporate more recipes and meals. Her weight is 258 lb (117 kg) today and has had a weight loss of 2 pounds over a period of 3 weeks since her last visit. She has lost 19 lbs since starting treatment with [place].  Vitamin D deficiency Destiny Dennis has a diagnosis of vitamin D deficiency. She is currently taking vit D and denies nausea, vomiting or muscle weakness.  At risk for osteopenia and osteoporosis Destiny Dennis is at higher risk of osteopenia and osteoporosis due to vitamin D deficiency.   Constipation Destiny Dennis notes constipation for the last few weeks, worse since attempting weight loss. She states BM are less frequent and are not hard and painful. She denies hematochezia or melena.   ALLERGIES: Allergies  Allergen Reactions  . Avelox [Moxifloxacin Hcl In Nacl] Other (See Comments)    Numbness & tingling Peripheral neuropathy    MEDICATIONS: Current Outpatient Prescriptions on File Prior to Visit  Medication Sig Dispense Refill  . acetaminophen (TYLENOL) 325 MG tablet Take 1-2 tablets (325-650 mg total) by mouth every 6 (six) hours as needed for mild pain or headache (For pain.).    Marland Kitchen amoxicillin (AMOXIL) 500 MG capsule Take 2,000 mg by mouth daily as needed. Take 1 hour prior to dental appointment.  4  . Cholecalciferol (VITAMIN D PO) Take 1 capsule by mouth daily.    . diazepam (VALIUM) 5 MG tablet Take 5 mg by mouth every 6 (six) hours as needed for muscle spasms.     . metoprolol succinate (TOPROL-XL) 100 MG 24 hr tablet TAKE ONE TABLET BY MOUTH IN THE AM AND HALF A TABLET IN THE PM  DAILY Take with or immediately following a meal.    . metoprolol succinate (TOPROL-XL) 100 MG 24 hr tablet TAKE 1 TABLET TWICE A DAY WITH OR IMMEDIATELY FOLLOWING A MEAL 180 tablet 2  . Multiple Vitamin (MULTIVITAMIN) capsule Take 1 capsule by mouth daily.    . Omega-3 Fatty Acids (FISH OIL PO) Take 1 capsule by mouth daily.     . pantoprazole (PROTONIX) 40 MG tablet TAKE 1 TABLET (40 MG TOTAL) BY MOUTH DAILY AS NEEDED (HEARTBURN). 90 tablet 1  . rivaroxaban (XARELTO) 20 MG TABS tablet Take 1 tablet (20 mg total) by mouth at bedtime. 30 tablet 11  . Vitamin D, Ergocalciferol, (DRISDOL) 50000 units CAPS capsule Take 1 capsule (50,000 Units total) by mouth every 7 (seven) days. 4 capsule 0  . [DISCONTINUED] flecainide (TAMBOCOR) 100 MG tablet Take 1 tablet (100 mg total) by mouth 2 (two) times daily. 60 tablet 12   No current facility-administered medications on file prior to visit.     PAST MEDICAL HISTORY: Past Medical History:  Diagnosis Date  . Allergic rhinitis   . Anxiety    when tachycardia occurs  . Arthritis    OA, s/p numerous surgeries -back, knees, hip  . Atrial flutter ([place])   . Atrial septal defect 01/01/2016   Discovered on TEE   . Benign fundic gland polyps of stomach   . Cervical disc disease   .  Complication of anesthesia   . Diverticulitis    CT Scan   . DVT (deep venous thrombosis) ([place])    in pregnancy, s/p hip surgery  . Eustachian tube dysfunction   . Fatty liver   . Gallbladder problem   . GERD 12/23/2009   diet related-not a problem  . Headache(784.0)    migraines occ.  Marland Kitchen Hemorrhoids   . Hiatal hernia    small  . History of MRSA infection 2008   superficial skin-cleared easily with doxycycline  . Hypertension   . Left ankle pain   . Left knee pain   . Obesity   . Peripheral vascular disease ([place])    left leg after hip surgery  . Persistent atrial fibrillation ([place]) 03/05/2007   a. s/p RFCA 12/18/2011, 07/29/12  . PONV (postoperative nausea and  vomiting)   . s/p atrial septal defect repair 05/28/2016  . S/P Maze operation for atrial fibrillation 05/28/2016   Complete bilateral atrial lesion set using cryothermy and bipolar radiofrequency ablation with clipping of LA appendage via median sternotomy  . Sialoadenitis   . SVT (supraventricular tachycardia) ([place]) 03/18/2010   PVCs  . Tachy-brady syndrome ([place])     PAST SURGICAL HISTORY: Past Surgical History:  Procedure Laterality Date  . ASD REPAIR N/A 05/28/2016   Procedure: ATRIAL SEPTAL DEFECT (ASD) closure;  Surgeon: Rexene Alberts, MD;  Location: [place];  Service: Open Heart Surgery;  Laterality: N/A;  . ATRIAL FIBRILLATION ABLATION N/A 12/18/2011   Procedure: ATRIAL FIBRILLATION ABLATION;  Surgeon: [name], MD;  Location: [facility] CATH LAB;  Service: Cardiovascular;  Laterality: N/A;  . ATRIAL FIBRILLATION ABLATION N/A 07/29/2012   Procedure: ATRIAL FIBRILLATION ABLATION;  Surgeon: [name], MD;  Location: [facility] CATH LAB;  Service: Cardiovascular;  Laterality: N/A;  . CARDIAC CATHETERIZATION N/A 04/10/2016   Procedure: Right/Left Heart Cath and Coronary Angiography;  Surgeon: Jettie Booze, MD;  Location: [place] [facility] CV LAB;  Service: Cardiovascular;  Laterality: N/A;  . CERVICAL FUSION    . CESAREAN SECTION     x 2  . CHOLECYSTECTOMY    . CLIPPING OF ATRIAL APPENDAGE  05/28/2016   Procedure: CLIPPING OF ATRIAL APPENDAGE;  Surgeon: Rexene Alberts, MD;  Location: [place];  Service: Open Heart Surgery;;  . COLONOSCOPY  07/16/2011   diverticulosis  . ELBOW SURGERY     Right  . EP IMPLANTABLE DEVICE N/A 09/10/2015   Procedure: Loop Recorder Insertion;  Surgeon: [name], MD;  Location: [place] CV LAB;  Service: Cardiovascular;  Laterality: N/A;  . EP IMPLANTABLE DEVICE N/A 06/08/2016   MDT Marcelino Scot CRT-P implanted by Dr Curt Bears  . FLEXIBLE SIGMOIDOSCOPY     1990's  . FOOT SURGERY     Right   . I&D KNEE WITH POLY EXCHANGE Left 12/12/2012   Procedure: LEFT  KNEE EXCISION SAPHENOUS NEUROMA/OPEN SCAR DEBRIDEMENT/POLY EXCHANGE/NERVE EXCISION;  Surgeon: Mauri Pole, MD;  Location: [facility];  Service: Orthopedics;  Laterality: Left;  . JOINT REPLACEMENT Bilateral    4'14  knees  . KNEE SURGERY     x 9 Left Knee  . MAZE  05/28/2016   Procedure: MAZE;  Surgeon: Rexene Alberts, MD;  Location: [place];  Service: Open Heart Surgery;;  . MEDIASTERNOTOMY N/A 05/28/2016   Procedure: MEDIAN STERNOTOMY;  Surgeon: Rexene Alberts, MD;  Location: [place];  Service: Open Heart Surgery;  Laterality: N/A;  . SHOULDER SURGERY     Right   . TEE WITHOUT  CARDIOVERSION  12/17/2011   Procedure: TRANSESOPHAGEAL ECHOCARDIOGRAM (TEE);  Surgeon: Larey Dresser, MD;  Location: [facility] ENDOSCOPY;  Service: Cardiovascular;  Laterality: N/A;  . TEE WITHOUT CARDIOVERSION  07/28/2012   Procedure: TRANSESOPHAGEAL ECHOCARDIOGRAM (TEE);  Surgeon: Thayer Headings, MD;  Location: [place];  Service: Cardiovascular;  Laterality: N/A;  . TEE WITHOUT CARDIOVERSION N/A 01/01/2016   Procedure: TRANSESOPHAGEAL ECHOCARDIOGRAM (TEE);  Surgeon: Sanda Klein, MD;  Location[facility] ENDOSCOPY;  Service: Cardiovascular;  Laterality: N/A;  . TEE WITHOUT CARDIOVERSION N/A 05/28/2016   Procedure: TRANSESOPHAGEAL ECHOCARDIOGRAM (TEE);  Surgeon: Rexene Alberts, MD;  Location: [place];  Service: Open Heart Surgery;  Laterality: N/A;  . TOTAL HIP ARTHROPLASTY Left 10/17/2013   Procedure: LEFT TOTAL HIP ARTHROPLASTY ANTERIOR APPROACH;  Surgeon: Mauri Pole, MD;  Location: [facility];  Service: Orthopedics;  Laterality: Left;  . TOTAL KNEE ARTHROPLASTY Right 12/12/2012   Procedure: RIGHT TOTAL KNEE ARTHROPLASTY;  Surgeon: Mauri Pole, MD;  Location: [facility];  Service: Orthopedics;  Laterality: Right;  . TUBAL LIGATION    . UPPER GASTROINTESTINAL ENDOSCOPY  01/30/2010   hiatal hernia, fundic gland polyps  . WRIST SURGERY     Left     SOCIAL HISTORY: Social History  Substance Use Topics  . Smoking status: Never  Smoker  . Smokeless tobacco: Never Used  . Alcohol use Yes     Comment: rare social    FAMILY HISTORY: Family History  Problem Relation Age of Onset  . Diabetes Mother   . Hyperlipidemia Mother   . Liver disease Father   . Obesity Father   . Diabetes Maternal Grandfather   . Diverticulitis Brother        x 2  . Diverticulitis Paternal Grandmother   . Colon cancer Neg Hx     ROS: Review of Systems  Constitutional: Positive for weight loss.  Gastrointestinal: Positive for constipation. Negative for blood in stool, melena, nausea and vomiting.  Musculoskeletal:       Negative muscle weakness    PHYSICAL EXAM: Blood pressure 116/76, pulse 71, temperature 98.5 F (36.9 C), temperature source Oral, height 5\' 11"  (1.803 m), weight 258 lb (117 kg), last menstrual period 04/17/2012, SpO2 100 %. Body mass index is 35.98 kg/m. Physical Exam  Constitutional: She is oriented to person, place, and time. She appears well-developed and well-nourished.  Cardiovascular: Normal rate.   Pulmonary/Chest: Effort normal.  Musculoskeletal: Normal range of motion.  Neurological: She is oriented to person, place, and time.  Skin: Skin is warm and dry.  Psychiatric: She has a normal mood and affect. Her behavior is normal.  Vitals reviewed.   RECENT LABS AND TESTS: BMET    Component Value Date/Time   NA 139 01/18/2017 1221   K 4.3 01/18/2017 1221   CL 100 01/18/2017 1221   CO2 23 01/18/2017 1221   GLUCOSE 94 01/18/2017 1221   GLUCOSE 117 (H) 06/24/2016 1343   BUN 14 01/18/2017 1221   CREATININE 0.77 01/18/2017 1221   CREATININE 0.91 06/24/2016 1343   CALCIUM 9.6 01/18/2017 1221   GFRNONAA 88 01/18/2017 1221   GFRAA 102 01/18/2017 1221   Lab Results  Component Value Date   HGBA1C 5.5 01/18/2017   HGBA1C 5.4 05/26/2016   Lab Results  Component Value Date   INSULIN 15.0 01/18/2017   CBC    Component Value Date/Time   WBC 6.8 01/18/2017 1221   WBC 8.5 06/24/2016 1343   RBC  4.44 01/18/2017 1221   RBC 3.95  06/24/2016 1343   HGB 11.9 01/18/2017 1221   HCT 36.6 01/18/2017 1221   PLT 458 (H) 06/24/2016 1343   MCV 82 01/18/2017 1221   MCH 26.8 01/18/2017 1221   MCH 26.8 (L) 06/24/2016 1343   MCHC 32.5 01/18/2017 1221   MCHC 31.5 (L) 06/24/2016 1343   RDW 14.9 01/18/2017 1221   LYMPHSABS 1.6 01/18/2017 1221   MONOABS 497 12/24/2015 0922   EOSABS 0.2 01/18/2017 1221   BASOSABS 0.0 01/18/2017 1221   Iron/TIBC/Ferritin/ %Sat No results found for: IRON, TIBC, FERRITIN, IRONPCTSAT Lipid Panel     Component Value Date/Time   CHOL 194 01/18/2017 1221   TRIG 121 01/18/2017 1221   HDL 60 01/18/2017 1221   CHOLHDL 4 06/13/2014 1022   VLDL 38.2 06/13/2014 1022   LDLCALC 110 (H) 01/18/2017 1221   LDLDIRECT 139.5 11/15/2006 1437   Hepatic Function Panel     Component Value Date/Time   PROT 7.3 01/18/2017 1221   ALBUMIN 4.7 01/18/2017 1221   AST 21 01/18/2017 1221   ALT 26 01/18/2017 1221   ALKPHOS 117 01/18/2017 1221   BILITOT 0.4 01/18/2017 1221   BILIDIR 0.1 05/04/2012 0938   IBILI 0.5 04/27/2011 0044      Component Value Date/Time   TSH 1.650 01/18/2017 1221   TSH 2.217 08/21/2015 1515   TSH 1.597 05/21/2015 0900   TSH 1.76 06/13/2014 1022   TSH 1.720 10/21/2013 1024    ASSESSMENT AND PLAN: Other constipation - Plan: polyethylene glycol powder (GLYCOLAX/MIRALAX) powder  Vitamin D deficiency  At risk for osteoporosis  Class 2 obesity with serious comorbidity and body mass index (BMI) of 36.0 to 36.9 in adult, unspecified obesity type  PLAN:  Vitamin D Deficiency Tamerra was informed that low vitamin D levels contributes to fatigue and are associated with obesity, breast, and colon cancer. She agrees to continue to take prescription Vit D @50 ,000 IU every week and will follow up for routine testing of vitamin D, at least 2-3 times per year. She was informed of the risk of over-replacement of vitamin D and agrees to not increase her dose unless  he discusses this with [place] first.  At risk for osteopenia and osteoporosis Roselle is at risk for osteopenia and osteoporosis due to her vitamin D deficiency. She was encouraged to take her vitamin D and follow her higher calcium diet and increase strengthening exercise to help strengthen her bones and decrease her risk of osteopenia and osteoporosis.  Constipation Altamese was informed decrease bowel movement frequency is normal while losing weight, but stools should not be hard or painful. She was advised to increase her H20 intake and work on increasing her fiber intake. High fiber foods were discussed today. Carizma agrees to start Miralax 1 capful daily and follow up with our clinic in 3 weeks.  Obesity Torianne is currently in the action stage of change. As such, her goal is to continue with weight loss efforts She has agreed to keep a food journal with 1450 to 1550 calories and 100+ grams of protein daily Sheylin has been instructed to work up to a goal of 150 minutes of combined cardio and strengthening exercise per week for weight loss and overall health benefits. We discussed the following Behavioral Modification Strategies today: increasing lean protein intake and work on meal planning and easy cooking plans  Preeya has agreed to follow up with our clinic in 3 weeks. She was informed of the importance of frequent follow up visits to maximize her  success with intensive lifestyle modifications for her multiple health conditions.  I, Doreene Nest, am acting as transcriptionist for Lacy Duverney, PA-C  I have reviewed the above documentation for accuracy and completeness, and I agree with the above. -Lacy Duverney, PA-C  I have reviewed the above note and agree with the plan. -Dennard Nip, MD   OBESITY BEHAVIORAL INTERVENTION VISIT  Today's visit was # 6 out of 22.  Starting weight: 277 lbs Starting date: 01/18/17 Today's weight : 258 lbs Today's date: 04/27/2017 Total lbs lost to date:  53 (Patients must lose 7 lbs in the first 6 months to continue with counseling)   ASK: We discussed the diagnosis of obesity with [name] today and Quiara agreed to give [place] permission to discuss obesity behavioral modification therapy today.  ASSESS: Emmarae has the diagnosis of obesity and her BMI today is [place] is in the action stage of change   ADVISE: Tifanny was educated on the multiple health risks of obesity as well as the benefit of weight loss to improve her health. She was advised of the need for long term treatment and the importance of lifestyle modifications.  AGREE: Multiple dietary modification options and treatment options were discussed and  Emillia agreed to keep a food journal with 14850 to 1550 calories and 100+ grams of protein daily We discussed the following Behavioral Modification Strategies today: increasing lean protein intake and work on meal planning and easy cooking plans

## 2017-04-29 ENCOUNTER — Ambulatory Visit (INDEPENDENT_AMBULATORY_CARE_PROVIDER_SITE_OTHER): Payer: 59 | Admitting: Sports Medicine

## 2017-04-29 VITALS — BP 114/84 | Ht 71.0 in | Wt 250.0 lb

## 2017-04-29 DIAGNOSIS — M2170 Unequal limb length (acquired), unspecified site: Secondary | ICD-10-CM | POA: Diagnosis not present

## 2017-04-29 DIAGNOSIS — M2141 Flat foot [pes planus] (acquired), right foot: Secondary | ICD-10-CM | POA: Diagnosis not present

## 2017-04-29 DIAGNOSIS — M2140 Flat foot [pes planus] (acquired), unspecified foot: Secondary | ICD-10-CM | POA: Insufficient documentation

## 2017-04-29 DIAGNOSIS — M357 Hypermobility syndrome: Secondary | ICD-10-CM | POA: Insufficient documentation

## 2017-04-29 DIAGNOSIS — M66872 Spontaneous rupture of other tendons, left ankle and foot: Secondary | ICD-10-CM | POA: Diagnosis not present

## 2017-04-29 DIAGNOSIS — M2142 Flat foot [pes planus] (acquired), left foot: Secondary | ICD-10-CM | POA: Diagnosis not present

## 2017-04-29 NOTE — Assessment & Plan Note (Signed)
See plan above.

## 2017-04-29 NOTE — Progress Notes (Signed)
HPI  CC: Left-sided ankle pain Patient presents today with chronic left-sided ankle pain over the past few years. She states that she is here for a second opinion as she has been told by her orthopedic surgeon that she would require surgical intervention to repair a tear in her posterior tibialis tendon. Patient states that her pain is localized along the medial aspect of her left ankle. This pain can be traced up into her calf. It is worse with plantar flexion. She states that this is been gradually worsening over the past few months.  Traumatic: no  Location: Posterior medial ankle, left  Quality: Sharp and aching  Duration: Years  Timing: Worse with prolonged standing  Improving/Worsening: Worsening  Makes better: Rest and massage  Makes worse: Prolonged standing or walking  Associated symptoms: Knee and back pain    Past Injuries: None recently  Past Surgeries: S/P C-spine fusion, left total hip replacement, bilateral total knee replacement, right-sided bunionectomy, right sided forefoot fusion.  Smoking: non  Family Hx: Brother with hypermobility.   ROS: Per HPI; in addition no fever, no rash, no additional weakness, no additional numbness, no additional paresthesias, and no additional falls/injury.   Objective: BP 114/84   Ht 5\' 11"  (1.803 m)   Wt 250 lb (113.4 kg)   LMP 04/17/2012   BMI 34.87 kg/m  Gen: NAD, well groomed, a/o x3, normal affect.  CV: Well-perfused. Warm.  Resp: Non-labored.  Neuro: Sensation intact throughout. No gross coordination deficits.  Gait: Nonpathologic posture, no signs of balance issues. Positive Trendelenburg gait with forceful foot strike on the left. Knee, bilateral: Well-healed surgical scarring noted bilaterally. Range of motion full on the right, only slight limitations in flexion on the left. Ankle/Foot, Left: TTP noted at the posterior medial aspect of the ankle with radiation up into the calf. No visible erythema, swelling, ecchymosis,  or bony deformity. Severe pes planus deformity bilaterally. Transverse arch grossly intact; Mild/moderate evidence of tibiotalar deviation (L>R); Range of motion is full in all directions. Strength is 5/5 in all directions. No tenderness at the insertion/body/myotendinous junction of the Achilles tendon; No peroneal tendon tenderness or subluxation; No tenderness on posterior aspects of lateral and medial malleolus; Stable lateral and medial ligaments; Unremarkable squeeze and kleiger tests; Talar dome with mild TTP; Unremarkable calcaneal squeeze; No plantar calcaneal tenderness; Mild tenderness at the plantar surface of the navicular; No tenderness over cuboid; No pain at base of 5th MT; No tenderness at the distal metatarsals; Able to walk 4 steps. Leg Length: Severe leg length discrepancy with measurement from ASIS to medial malleolus 93cm on right, and 97cm on left   Assessment and Plan:  Tibialis posterior tendon tear, nontraumatic, left Patient presented with left-sided ankle pain notable for significant ankle and forefoot breakdown with has planus deformity, and tibiotalar valgus deviation. Leg length discrepancy of approximately 4 cm was also noted. Gait was also altered likely secondary to this length discrepancy. It is not unlikely that many of these factors were intruding into the likelihood of her posterior tibialis rupture. - Patient provided a 1.5 cm wedge in her right shoe and a medium-sized scaphoid pad for her left shoe today in clinic. Patient endorsed improve symptoms with these additions. - Right ankle compression sleeve provided today. 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 - Patient to continue using scaphoid pad and heel lift. She is to follow-up in our  clinic for custom orthotics (assuming she continues to have some improvement) - Patient's severe leg length  discrepancy will not be entirely corrected with orthotics >> consideration for shoe lifts may be necessary.  Hypermobility syndrome See plan above.  Leg length discrepancy See plan above.  Flat foot See plan above.    Destiny Leatherwood, MD,MS [facility] Fellow 04/29/2017 1:17 PM

## 2017-04-29 NOTE — Assessment & Plan Note (Signed)
Patient presented with left-sided ankle pain notable for significant ankle and forefoot breakdown with has planus deformity, and tibiotalar valgus deviation. Leg length discrepancy of approximately 4 cm was also noted. Gait was also altered likely secondary to this length discrepancy. It is not unlikely that many of these factors were intruding into the likelihood of her posterior tibialis rupture. - Patient provided a 1.5 cm wedge in her right shoe and a medium-sized scaphoid pad for her left shoe today in clinic. Patient endorsed improve symptoms with these additions. - Right ankle compression sleeve provided today. 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 - Patient to continue using scaphoid pad and heel lift. She is to follow-up in our clinic for custom orthotics (assuming she continues to have some improvement) - Patient's severe leg length discrepancy will not be entirely corrected with orthotics >> consideration for shoe lifts may be necessary.

## 2017-04-30 ENCOUNTER — Encounter: Payer: Self-pay | Admitting: Cardiology

## 2017-04-30 ENCOUNTER — Ambulatory Visit: Payer: 59

## 2017-05-06 ENCOUNTER — Encounter: Payer: Self-pay | Admitting: Family Medicine

## 2017-05-06 ENCOUNTER — Ambulatory Visit: Payer: 59 | Admitting: Sports Medicine

## 2017-05-07 ENCOUNTER — Ambulatory Visit: Payer: 59

## 2017-05-12 ENCOUNTER — Ambulatory Visit
Admission: RE | Admit: 2017-05-12 | Discharge: 2017-05-12 | Disposition: A | Discharge status: Home / Self Care | Payer: 59 | Source: Ambulatory Visit | Attending: Obstetrics and Gynecology | Admitting: Obstetrics and Gynecology

## 2017-05-12 DIAGNOSIS — Z1231 Encounter for screening mammogram for malignant neoplasm of breast: Secondary | ICD-10-CM

## 2017-05-18 ENCOUNTER — Ambulatory Visit (INDEPENDENT_AMBULATORY_CARE_PROVIDER_SITE_OTHER): Payer: 59 | Admitting: Physician Assistant

## 2017-05-18 VITALS — BP 113/71 | HR 65 | Temp 98.2°F | Ht 71.0 in | Wt 258.0 lb

## 2017-05-18 DIAGNOSIS — E559 Vitamin D deficiency, unspecified: Secondary | ICD-10-CM | POA: Diagnosis not present

## 2017-05-18 DIAGNOSIS — E8881 Metabolic syndrome: Secondary | ICD-10-CM | POA: Insufficient documentation

## 2017-05-18 DIAGNOSIS — Z6836 Body mass index (BMI) 36.0-36.9, adult: Secondary | ICD-10-CM

## 2017-05-18 DIAGNOSIS — E7849 Other hyperlipidemia: Secondary | ICD-10-CM | POA: Diagnosis not present

## 2017-05-18 DIAGNOSIS — Z9189 Other specified personal risk factors, not elsewhere classified: Secondary | ICD-10-CM

## 2017-05-18 MED ORDER — VITAMIN D (ERGOCALCIFEROL) 1.25 MG (50000 UNIT) PO CAPS: 50000.0000 [IU] | ORAL_CAPSULE | ORAL | 0 refills | Status: DC

## 2017-05-18 NOTE — Progress Notes (Signed)
Office: [phone]  /  Fax: [phone]   HPI:   Chief Complaint: OBESITY Destiny Dennis is here to discuss her progress with her obesity treatment plan. She is on the Category 3 plan and is following her eating plan approximately 90 % of the time. She states she is exercising 0 minutes 0 times per week. Destiny Dennis has been traveling more for work and has been eating out more. She still manages to control her portions and make smarter food choices. Her weight is 258 lb (117 kg) today and has maintained weight over a period of 3 weeks since her last visit. She has lost 19 lbs since starting treatment with [place].  Vitamin D deficiency Destiny Dennis has a diagnosis of vitamin D deficiency. She is currently taking vit D and denies nausea, vomiting or muscle weakness.  Insulin Resistance Destiny Dennis has a diagnosis of insulin resistance based on her elevated fasting insulin level >5. Although Destiny Dennis's blood glucose readings are still under good control, insulin resistance puts her at greater risk of metabolic syndrome and diabetes. She is not taking metformin currently and continues to work on diet and exercise to decrease risk of diabetes.  At risk for diabetes Destiny Dennis is at higher than average risk for developing diabetes due to her obesity and insulin resistance. She currently denies polyuria or polydipsia.  Hyperlipidemia Destiny Dennis has hyperlipidemia and is not currently taking a statin. She has been trying to improve her cholesterol levels with intensive lifestyle modification including a low saturated fat diet, exercise and weight loss. She denies any chest pain, claudication or myalgias.  ALLERGIES: Allergies  Allergen Reactions  . Avelox [Moxifloxacin Hcl In Nacl] Other (See Comments)    Numbness & tingling Peripheral neuropathy    MEDICATIONS: Current Outpatient Prescriptions on File Prior to Visit  Medication Sig Dispense Refill  . acetaminophen (TYLENOL) 325 MG tablet Take 1-2 tablets (325-650 mg  total) by mouth every 6 (six) hours as needed for mild pain or headache (For pain.).    Marland Kitchen amoxicillin (AMOXIL) 500 MG capsule Take 2,000 mg by mouth daily as needed. Take 1 hour prior to dental appointment.  4  . diazepam (VALIUM) 5 MG tablet Take 5 mg by mouth every 6 (six) hours as needed for muscle spasms.     . metoprolol succinate (TOPROL-XL) 100 MG 24 hr tablet TAKE ONE TABLET BY MOUTH IN THE AM AND HALF A TABLET IN THE PM DAILY Take with or immediately following a meal.    . Multiple Vitamin (MULTIVITAMIN) capsule Take 1 capsule by mouth daily.    . pantoprazole (PROTONIX) 40 MG tablet TAKE 1 TABLET (40 MG TOTAL) BY MOUTH DAILY AS NEEDED (HEARTBURN). 90 tablet 1  . polyethylene glycol powder (GLYCOLAX/MIRALAX) powder Take 17 g by mouth daily. 3350 g 0  . rivaroxaban (XARELTO) 20 MG TABS tablet Take 1 tablet (20 mg total) by mouth at bedtime. 30 tablet 11  . Vitamin D, Ergocalciferol, (DRISDOL) 50000 units CAPS capsule Take 1 capsule (50,000 Units total) by mouth every 7 (seven) days. 4 capsule 0  . [DISCONTINUED] flecainide (TAMBOCOR) 100 MG tablet Take 1 tablet (100 mg total) by mouth 2 (two) times daily. 60 tablet 12   No current facility-administered medications on file prior to visit.     PAST MEDICAL HISTORY: Past Medical History:  Diagnosis Date  . Allergic rhinitis   . Anxiety    when tachycardia occurs  . Arthritis    OA, s/p numerous surgeries -back, knees, hip  . Atrial  flutter ([place])   . Atrial septal defect 01/01/2016   Discovered on TEE   . Benign fundic gland polyps of stomach   . Cervical disc disease   . Complication of anesthesia   . Diverticulitis    CT Scan   . DVT (deep venous thrombosis) ([place])    in pregnancy, s/p hip surgery  . Eustachian tube dysfunction   . Fatty liver   . Gallbladder problem   . GERD 12/23/2009   diet related-not a problem  . Headache(784.0)    migraines occ.  Marland Kitchen Hemorrhoids   . Hiatal hernia    small  . History of MRSA infection  2008   superficial skin-cleared easily with doxycycline  . Hypertension   . Left ankle pain   . Left knee pain   . Obesity   . Peripheral vascular disease ([place])    left leg after hip surgery  . Persistent atrial fibrillation ([place]) 03/05/2007   a. s/p RFCA 12/18/2011, 07/29/12  . PONV (postoperative nausea and vomiting)   . s/p atrial septal defect repair 05/28/2016  . S/P Maze operation for atrial fibrillation 05/28/2016   Complete bilateral atrial lesion set using cryothermy and bipolar radiofrequency ablation with clipping of LA appendage via median sternotomy  . Sialoadenitis   . SVT (supraventricular tachycardia) ([place]) 03/18/2010   PVCs  . Tachy-brady syndrome ([place])     PAST SURGICAL HISTORY: Past Surgical History:  Procedure Laterality Date  . ASD REPAIR N/A 05/28/2016   Procedure: ATRIAL SEPTAL DEFECT (ASD) closure;  Surgeon: Rexene Alberts, MD;  Location: [place];  Service: Open Heart Surgery;  Laterality: N/A;  . ATRIAL FIBRILLATION ABLATION N/A 12/18/2011   Procedure: ATRIAL FIBRILLATION ABLATION;  Surgeon: [name], MD;  Location: [facility] CATH LAB;  Service: Cardiovascular;  Laterality: N/A;  . ATRIAL FIBRILLATION ABLATION N/A 07/29/2012   Procedure: ATRIAL FIBRILLATION ABLATION;  Surgeon: [name], MD;  Location: [facility] CATH LAB;  Service: Cardiovascular;  Laterality: N/A;  . CARDIAC CATHETERIZATION N/A 04/10/2016   Procedure: Right/Left Heart Cath and Coronary Angiography;  Surgeon: Jettie Booze, MD;  Location: [place] CV LAB;  Service: Cardiovascular;  Laterality: N/A;  . CERVICAL FUSION    . CESAREAN SECTION     x 2  . CHOLECYSTECTOMY    . CLIPPING OF ATRIAL APPENDAGE  05/28/2016   Procedure: CLIPPING OF ATRIAL APPENDAGE;  Surgeon: Rexene Alberts, MD;  Location: [place];  Service: Open Heart Surgery;;  . COLONOSCOPY  07/16/2011   diverticulosis  . ELBOW SURGERY     Right  . EP IMPLANTABLE DEVICE N/A 09/10/2015   Procedure: Loop Recorder Insertion;  Surgeon:  [name], MD;  Location: [place] CV LAB;  Service: Cardiovascular;  Laterality: N/A;  . EP IMPLANTABLE DEVICE N/A 06/08/2016   MDT Marcelino Scot CRT-P implanted by Dr Curt Bears  . FLEXIBLE SIGMOIDOSCOPY     1990's  . FOOT SURGERY     Right   . I&D KNEE WITH POLY EXCHANGE Left 12/12/2012   Procedure: LEFT KNEE EXCISION SAPHENOUS NEUROMA/OPEN SCAR DEBRIDEMENT/POLY EXCHANGE/NERVE EXCISION;  Surgeon: Mauri Pole, MD;  Location: [facility];  Service: Orthopedics;  Laterality: Left;  . JOINT REPLACEMENT Bilateral    4'14  knees  . KNEE SURGERY     x 9 Left Knee  . MAZE  05/28/2016   Procedure: MAZE;  Surgeon: Rexene Alberts, MD;  Location: [place];  Service: Open Heart Surgery;;  . MEDIASTERNOTOMY N/A 05/28/2016   Procedure: MEDIAN STERNOTOMY;  Surgeon: Rexene Alberts, MD;  Location: [place];  Service: Open Heart Surgery;  Laterality: N/A;  . REDUCTION MAMMAPLASTY Bilateral   . SHOULDER SURGERY     Right   . TEE WITHOUT CARDIOVERSION  12/17/2011   Procedure: TRANSESOPHAGEAL ECHOCARDIOGRAM (TEE);  Surgeon: Larey Dresser, MD;  Location[facility]OSCOPY;  Service: Cardiovascular;  Laterality: N/A;  . TEE WITHOUT CARDIOVERSION  07/28/2012   Procedure: TRANSESOPHAGEAL ECHOCARDIOGRAM (TEE);  Surgeon: Thayer Headings, MD;  Location: [place];  Service: Cardiovascular;  Laterality: N/A;  . TEE WITHOUT CARDIOVERSION N/A 01/01/2016   Procedure: TRANSESOPHAGEAL ECHOCARDIOGRAM (TEE);  Surgeon: Sanda Klein, MD;  Location: [facility] ENDOSCOPY;  Service: Cardiovascular;  Laterality: N/A;  . TEE WITHOUT CARDIOVERSION N/A 05/28/2016   Procedure: TRANSESOPHAGEAL ECHOCARDIOGRAM (TEE);  Surgeon: Rexene Alberts, MD;  Location: [place];  Service: Open Heart Surgery;  Laterality: N/A;  . TOTAL HIP ARTHROPLASTY Left 10/17/2013   Procedure: LEFT TOTAL HIP ARTHROPLASTY ANTERIOR APPROACH;  Surgeon: Mauri Pole, MD;  Location: [facility];  Service: Orthopedics;  Laterality: Left;  . TOTAL KNEE ARTHROPLASTY Right 12/12/2012    Procedure: RIGHT TOTAL KNEE ARTHROPLASTY;  Surgeon: Mauri Pole, MD;  Location: [facility];  Service: Orthopedics;  Laterality: Right;  . TUBAL LIGATION    . UPPER GASTROINTESTINAL ENDOSCOPY  01/30/2010   hiatal hernia, fundic gland polyps  . WRIST SURGERY     Left     SOCIAL HISTORY: Social History  Substance Use Topics  . Smoking status: Never Smoker  . Smokeless tobacco: Never Used  . Alcohol use Yes     Comment: rare social    FAMILY HISTORY: Family History  Problem Relation Age of Onset  . Diabetes Mother   . Hyperlipidemia Mother   . Liver disease Father   . Obesity Father   . Diabetes Maternal Grandfather   . Diverticulitis Brother        x 2  . Diverticulitis Paternal Grandmother   . Colon cancer Neg Hx   . Breast cancer Neg Hx     ROS: Review of Systems  Constitutional: Negative for weight loss.  Cardiovascular: Negative for chest pain and claudication.  Gastrointestinal: Negative for nausea and vomiting.  Genitourinary: Negative for frequency.  Musculoskeletal: Negative for myalgias.       Negative muscle weakness  Endo/Heme/Allergies: Negative for polydipsia.    PHYSICAL EXAM: Blood pressure 113/71, pulse 65, temperature 98.2 F (36.8 C), temperature source Oral, height 5\' 11"  (1.803 m), weight 258 lb (117 kg), last menstrual period 04/17/2012, SpO2 97 %. Body mass index is 35.98 kg/m. Physical Exam  Constitutional: She is oriented to person, place, and time. She appears well-developed and well-nourished.  Cardiovascular: Normal rate.   Pulmonary/Chest: Effort normal.  Musculoskeletal: Normal range of motion.  Neurological: She is oriented to person, place, and time.  Skin: Skin is warm and dry.  Psychiatric: She has a normal mood and affect. Her behavior is normal.  Vitals reviewed.   RECENT LABS AND TESTS: BMET    Component Value Date/Time   NA 139 01/18/2017 1221   K 4.3 01/18/2017 1221   CL 100 01/18/2017 1221   CO2 23 01/18/2017 1221    GLUCOSE 94 01/18/2017 1221   GLUCOSE 117 (H) 06/24/2016 1343   BUN 14 01/18/2017 1221   CREATININE 0.77 01/18/2017 1221   CREATININE 0.91 06/24/2016 1343   CALCIUM 9.6 01/18/2017 1221   GFRNONAA 88 01/18/2017 1221   GFRAA 102 01/18/2017 1221   Lab Results  Component Value Date   HGBA1C 5.5 01/18/2017   HGBA1C 5.4 05/26/2016   Lab Results  Component Value Date   INSULIN 15.0 01/18/2017   CBC    Component Value Date/Time   WBC 6.8 01/18/2017 1221   WBC 8.5 06/24/2016 1343   RBC 4.44 01/18/2017 1221   RBC 3.95 06/24/2016 1343   HGB 11.9 01/18/2017 1221   HCT 36.6 01/18/2017 1221   PLT 458 (H) 06/24/2016 1343   MCV 82 01/18/2017 1221   MCH 26.8 01/18/2017 1221   MCH 26.8 (L) 06/24/2016 1343   MCHC 32.5 01/18/2017 1221   MCHC 31.5 (L) 06/24/2016 1343   RDW 14.9 01/18/2017 1221   LYMPHSABS 1.6 01/18/2017 1221   MONOABS 497 12/24/2015 0922   EOSABS 0.2 01/18/2017 1221   BASOSABS 0.0 01/18/2017 1221   Iron/TIBC/Ferritin/ %Sat No results found for: IRON, TIBC, FERRITIN, IRONPCTSAT Lipid Panel     Component Value Date/Time   CHOL 194 01/18/2017 1221   TRIG 121 01/18/2017 1221   HDL 60 01/18/2017 1221   CHOLHDL 4 06/13/2014 1022   VLDL 38.2 06/13/2014 1022   LDLCALC 110 (H) 01/18/2017 1221   LDLDIRECT 139.5 11/15/2006 1437   Hepatic Function Panel     Component Value Date/Time   PROT 7.3 01/18/2017 1221   ALBUMIN 4.7 01/18/2017 1221   AST 21 01/18/2017 1221   ALT 26 01/18/2017 1221   ALKPHOS 117 01/18/2017 1221   BILITOT 0.4 01/18/2017 1221   BILIDIR 0.1 05/04/2012 0938   IBILI 0.5 04/27/2011 0044      Component Value Date/Time   TSH 1.650 01/18/2017 1221   TSH 2.217 08/21/2015 1515   TSH 1.597 05/21/2015 0900   TSH 1.76 06/13/2014 1022   TSH 1.720 10/21/2013 1024    ASSESSMENT AND PLAN: Vitamin D deficiency - Plan: VITAMIN D 25 Hydroxy (Vit-D Deficiency, Fractures), Vitamin D, Ergocalciferol, (DRISDOL) 50000 units CAPS capsule  Insulin resistance -  Plan: Comprehensive metabolic panel, Hemoglobin A1c, Insulin, random  Other hyperlipidemia - Plan: Lipid Panel With LDL/HDL Ratio  At risk for diabetes mellitus  Class 2 severe obesity with serious comorbidity and body mass index (BMI) of 36.0 to 36.9 in adult, unspecified obesity type ([place])  PLAN:  Vitamin D Deficiency Destiny Dennis was informed that low vitamin D levels contributes to fatigue and are associated with obesity, breast, and colon cancer. She agrees to continue to take prescription Vit D @50 ,000 IU every week, we will refill for 1 month and check labs and will follow up for routine testing of vitamin D, at least 2-3 times per year. She was informed of the risk of over-replacement of vitamin D and agrees to not increase her dose unless he discusses this with [place] first. Destiny Dennis agrees to follow up with our clinic in 3 weeks.  Insulin Resistance Destiny Dennis will continue to work on weight loss, exercise, and decreasing simple carbohydrates in her diet to help decrease the risk of diabetes. She was informed that eating too many simple carbohydrates or too many calories at one sitting increases the likelihood of GI side effects. We will check labs and Destiny Dennis agreed to follow up with [place] as directed to monitor her progress.  Diabetes risk counseling Destiny Dennis was given extended (15 minutes) diabetes prevention counseling today. She is 55 y.o. female and has risk factors for diabetes including obesity and insulin resistance. We discussed intensive lifestyle modifications today with an emphasis on weight loss as well as increasing exercise and decreasing simple carbohydrates in her  diet.  Hyperlipidemia Destiny Dennis was informed of the American Heart Association Guidelines emphasizing intensive lifestyle modifications as the first line treatment for hyperlipidemia. We discussed many lifestyle modifications today in depth, and Destiny Dennis will continue to work on decreasing saturated fats such as fatty red meat, butter  and many fried foods. She will also increase vegetables and lean protein in her diet and continue to work on exercise and weight loss efforts. We will check labs and Destiny Dennis agrees to follow up with our clinic in 3 weeks.  Obesity Destiny Dennis is currently in the action stage of change. As such, her goal is to continue with weight loss efforts She has agreed to follow the Category 3 plan Destiny Dennis has been instructed to work up to a goal of 150 minutes of combined cardio and strengthening exercise per week for weight loss and overall health benefits. We discussed the following Behavioral Modification Strategies today: increasing lean protein intake and work on meal planning and easy cooking plans  Destiny Dennis has agreed to follow up with our clinic in 3 weeks. She was informed of the importance of frequent follow up visits to maximize her success with intensive lifestyle modifications for her multiple health conditions.  I, Doreene Nest, am acting as transcriptionist for Lacy Duverney, PA-C  I have reviewed the above documentation for accuracy and completeness, and I agree with the above. -Lacy Duverney, PA-C  I have reviewed the above note and agree with the plan. -Dennard Nip, MD  OBESITY BEHAVIORAL INTERVENTION VISIT  Today's visit was # 7 out of 22.  Starting weight: 277 lbs Starting date: 01/18/17 Today's weight : 258 lbs  Today's date: 05/18/2017 Total lbs lost to date: 30 (Patients must lose 7 lbs in the first 6 months to continue with counseling)   ASK: We discussed the diagnosis of obesity with Destiny Dennis today and Destiny Dennis agreed to give [place] permission to discuss obesity behavioral modification therapy today.  ASSESS: Destiny Dennis has the diagnosis of obesity and her BMI today is Destiny Dennis is in the action stage of change   ADVISE: Destiny Dennis was educated on the multiple health risks of obesity as well as the benefit of weight loss to improve her health. She was advised of the need for long term  treatment and the importance of lifestyle modifications.  AGREE: Multiple dietary modification options and treatment options were discussed and  Destiny Dennis agreed to follow the Category 3 plan We discussed the following Behavioral Modification Strategies today: increasing lean protein intake and work on meal planning and easy cooking plans

## 2017-05-19 ENCOUNTER — Telehealth: Payer: Self-pay | Admitting: Nurse Practitioner

## 2017-05-19 ENCOUNTER — Encounter: Payer: 59 | Admitting: Family Medicine

## 2017-05-19 LAB — COMPREHENSIVE METABOLIC PANEL
ALBUMIN: 4.6 g/dL (ref 3.5–5.5)
ALK PHOS: 121 IU/L — AB (ref 39–117)
ALT: 17 IU/L (ref 0–32)
AST: 13 IU/L (ref 0–40)
Albumin/Globulin Ratio: 2.2 (ref 1.2–2.2)
BILIRUBIN TOTAL: 0.5 mg/dL (ref 0.0–1.2)
BUN / CREAT RATIO: 23 (ref 9–23)
BUN: 19 mg/dL (ref 6–24)
CHLORIDE: 103 mmol/L (ref 96–106)
CO2: 23 mmol/L (ref 20–29)
CREATININE: 0.84 mg/dL (ref 0.57–1.00)
Calcium: 9.3 mg/dL (ref 8.7–10.2)
GFR calc Af Amer: 91 mL/min/{1.73_m2} (ref >59)
GFR calc non Af Amer: 79 mL/min/{1.73_m2} (ref >59)
GLOBULIN, TOTAL: 2.1 g/dL (ref 1.5–4.5)
GLUCOSE: 97 mg/dL (ref 65–99)
Potassium: 4.5 mmol/L (ref 3.5–5.2)
SODIUM: 143 mmol/L (ref 134–144)
Total Protein: 6.7 g/dL (ref 6.0–8.5)

## 2017-05-19 LAB — LIPID PANEL WITH LDL/HDL RATIO
Cholesterol, Total: 176 mg/dL (ref 100–199)
HDL: 49 mg/dL (ref >39)
LDL CALC: 108 mg/dL — AB (ref 0–99)
LDL/HDL RATIO: 2.2 ratio (ref 0.0–3.2)
TRIGLYCERIDES: 94 mg/dL (ref 0–149)
VLDL Cholesterol Cal: 19 mg/dL (ref 5–40)

## 2017-05-19 LAB — HEMOGLOBIN A1C
Est. average glucose Bld gHb Est-mCnc: 105 mg/dL
HEMOGLOBIN A1C: 5.3 % (ref 4.8–5.6)

## 2017-05-19 LAB — VITAMIN D 25 HYDROXY (VIT D DEFICIENCY, FRACTURES): Vit D, 25-Hydroxy: 38.6 ng/mL (ref 30.0–100.0)

## 2017-05-19 LAB — INSULIN, RANDOM: INSULIN: 16.2 u[IU]/mL (ref 2.6–24.9)

## 2017-05-19 MED ORDER — METRONIDAZOLE 500 MG PO TABS: 500.0000 mg | ORAL_TABLET | Freq: Three times a day (TID) | ORAL | 0 refills | Status: AC

## 2017-05-19 MED ORDER — CIPROFLOXACIN HCL 500 MG PO TABS: 500.0000 mg | ORAL_TABLET | Freq: Two times a day (BID) | ORAL | 0 refills | Status: DC

## 2017-05-19 NOTE — Telephone Encounter (Signed)
Patient called with complaints of LLQ discomfort and heaviness over last several days. She has been taking Miralax 1-2 times a day thinking pain could be secondary to constipation. She has had a few BMs with the miralax but can't really tell if LLQ symptoms much better. She is concerned that is is start of recurrent diverticulitis. No nausea or fevers. She has some urinary leakage but no dysuria.    She will try 1/2 bottle of Mg Citrate tonight. If adequate results but discomfort persists then  will  Start Cipro and Flagyl. X 7 days. She will then need office follow up appt. I will see her in clinic in 2 weeks, or sooner if need be.

## 2017-05-24 ENCOUNTER — Encounter: Payer: Self-pay | Admitting: Thoracic Surgery (Cardiothoracic Vascular Surgery)

## 2017-05-24 ENCOUNTER — Ambulatory Visit (INDEPENDENT_AMBULATORY_CARE_PROVIDER_SITE_OTHER): Payer: 59 | Admitting: Thoracic Surgery (Cardiothoracic Vascular Surgery)

## 2017-05-24 VITALS — BP 129/74 | HR 65 | Ht 71.0 in | Wt 266.0 lb

## 2017-05-24 DIAGNOSIS — I481 Persistent atrial fibrillation: Secondary | ICD-10-CM | POA: Diagnosis not present

## 2017-05-24 DIAGNOSIS — Z8679 Personal history of other diseases of the circulatory system: Secondary | ICD-10-CM | POA: Diagnosis not present

## 2017-05-24 DIAGNOSIS — Z9889 Other specified postprocedural states: Secondary | ICD-10-CM | POA: Diagnosis not present

## 2017-05-24 DIAGNOSIS — Z8774 Personal history of (corrected) congenital malformations of heart and circulatory system: Secondary | ICD-10-CM

## 2017-05-24 DIAGNOSIS — I4819 Other persistent atrial fibrillation: Secondary | ICD-10-CM

## 2017-05-24 NOTE — Patient Instructions (Signed)
Continue all previous medications without any changes at this time  

## 2017-05-24 NOTE — Progress Notes (Signed)
Destiny Dennis [address]       [place] [address]             [phone]     CARDIOTHORACIC SURGERY OFFICE NOTE  Referring Provider is [name], MD PCP is Lucretia Kern, DO   HPI:  Patient is a 56 year old obese female with multiple chronic medical problems who returns to the office today for routine follow-up approximately one year status post Maze procedure and closure of atrial septal defect on 05/28/2016. She was last seen here in our office on 09/21/2016 at which time she was maintaining sinus rhythm and doing quite well. Since then she has been followed carefully by Dr. Rayann Heman in the atrial fibrillation clinic.  She has done quite well clinically and interrogation of her pacemaker has revealed no signs of recurrent atrial dysrhythmias. She remains chronically anticoagulated using Xarelto.  She has not had a follow-up echocardiogram to reassess left ventricular systolic function, but one has been scheduled for November of this year. She returns to our office today and reports that she is doing exceptionally well. She is exercising regularly and participating in weight loss clinic. She has lost over 25 pounds in weight. She feels quite well and denies any symptoms of palpitations or other signs to suggest a recurrence of atrial fibrillation. Overall she states that she feels "much improved" in comparison with how she felt prior to surgery. She gets short of breath only with more strenuous physical exertion and this does not limit her activities at all. Her biggest limitation remains chronic pain in her ankle. She has not had any bleeding complications.   Current Outpatient Prescriptions  Medication Sig Dispense Refill  . acetaminophen (TYLENOL) 325 MG tablet Take 1-2 tablets (325-650 mg total) by mouth every 6 (six) hours as needed for mild pain or headache (For pain.).    Marland Kitchen diazepam (VALIUM) 5 MG tablet Take 5 mg by mouth every 6 (six) hours as needed for muscle spasms.      . metoprolol succinate (TOPROL-XL) 100 MG 24 hr tablet TAKE ONE TABLET BY MOUTH IN THE AM AND HALF A TABLET IN THE PM DAILY Take with or immediately following a meal.    . metroNIDAZOLE (FLAGYL) 500 MG tablet Take 1 tablet (500 mg total) by mouth 3 (three) times daily. 21 tablet 0  . Multiple Vitamin (MULTIVITAMIN) capsule Take 1 capsule by mouth daily.    . pantoprazole (PROTONIX) 40 MG tablet TAKE 1 TABLET (40 MG TOTAL) BY MOUTH DAILY AS NEEDED (HEARTBURN). 90 tablet 1  . polyethylene glycol powder (GLYCOLAX/MIRALAX) powder Take 17 g by mouth daily. 3350 g 0  . rivaroxaban (XARELTO) 20 MG TABS tablet Take 1 tablet (20 mg total) by mouth at bedtime. 30 tablet 11  . Vitamin D, Ergocalciferol, (DRISDOL) 50000 units CAPS capsule Take 1 capsule (50,000 Units total) by mouth every 7 (seven) days. 4 capsule 0  . amoxicillin (AMOXIL) 500 MG capsule Take 2,000 mg by mouth daily as needed. Take 1 hour prior to dental appointment.  4  . ciprofloxacin (CIPRO) 500 MG tablet Take 1 tablet (500 mg total) by mouth 2 (two) times daily. (Patient not taking: Reported on 05/24/2017) 14 tablet 0   No current facility-administered medications for this visit.       Physical Exam:   BP 129/74   Pulse 65   Ht 5\' 11"  (1.803 m)   Wt 266 lb (120.7 kg)   LMP 04/17/2012   SpO2  98%   BMI 37.10 kg/m   General:  Obese but well appearing  Chest:   Clear to auscultation  CV:   Regular rate and rhythm without murmur  Incisions:  Completely healed  Abdomen:  Soft nontender  Extremities:  Warm and well-perfused  Diagnostic Tests:  2 channel telemetry rhythm strip demonstrates paced rhythm   Impression:  Patient is doing well and maintaining stable paced rhythm without recurrence of atrial fibrillation now approximately one year status post Maze procedure and closure of small atrial septal defect.    Plan:  We have not recommended any changes to the patient's current medications. We discussed the risks and  benefits of long-term anticoagulation. She will continue to follow-up with Dr. Rayann Heman in the atrial fibrillation clinic. In the future she will call and return to see [place] only should specific problems or questions arise.  I spent in excess of 15 minutes during the conduct of this office consultation and >50% of this time involved direct face-to-face encounter with the patient for counseling and/or coordination of their care.    Valentina Gu. Roxy Manns, MD 05/24/2017 2:28 PM

## 2017-05-27 ENCOUNTER — Other Ambulatory Visit: Payer: Self-pay

## 2017-05-27 ENCOUNTER — Ambulatory Visit (INDEPENDENT_AMBULATORY_CARE_PROVIDER_SITE_OTHER)
Admission: RE | Admit: 2017-05-27 | Discharge: 2017-05-27 | Disposition: A | Discharge status: Home / Self Care | Payer: 59 | Source: Ambulatory Visit | Attending: Nurse Practitioner | Admitting: Nurse Practitioner

## 2017-05-27 ENCOUNTER — Telehealth: Payer: Self-pay

## 2017-05-27 DIAGNOSIS — R1032 Left lower quadrant pain: Secondary | ICD-10-CM

## 2017-05-27 NOTE — Telephone Encounter (Signed)
Order entered for single view KUB

## 2017-05-27 NOTE — Telephone Encounter (Signed)
-----   Message from Willia Craze, NP sent at 05/27/2017 10:16 AM EDT ----- Eustaquio Maize, I spoke with patient and she is still having problems with some LLQ discomfort / gurgling. She hasn't yet started the antibiotics I called in for possible diverticulitis because she has been trying to clean out bowels with mg citrate and miralax  The LLQ pressure seems to improve with BMs but having a lot of gurgling in LLQ. Afebrile. She may have IBS. I asked her to come in for a KUB today. Can you put the order in computer please.  Thanks

## 2017-05-28 ENCOUNTER — Other Ambulatory Visit: Payer: Self-pay

## 2017-05-28 ENCOUNTER — Telehealth: Payer: Self-pay

## 2017-05-28 MED ORDER — ONDANSETRON HCL 4 MG PO TABS: 4.0000 mg | ORAL_TABLET | Freq: Three times a day (TID) | ORAL | 0 refills | Status: DC | PRN

## 2017-05-28 NOTE — Telephone Encounter (Signed)
-----   Message from Willia Craze, NP sent at 05/28/2017  2:43 PM EDT ----- Destiny Dennis having some nausea from Flagyl. Offered to changed to Augmentin but she will continue the flagy and cipro. Can you send in rx for zofran 4 mg Q 8 hours prn #40. Thanks. [facility] closed so please call to [facility] on [address] [facility]

## 2017-05-28 NOTE — Telephone Encounter (Signed)
Script sent to pharmacy.

## 2017-06-01 ENCOUNTER — Encounter (INDEPENDENT_AMBULATORY_CARE_PROVIDER_SITE_OTHER): Payer: Self-pay

## 2017-06-01 ENCOUNTER — Ambulatory Visit (INDEPENDENT_AMBULATORY_CARE_PROVIDER_SITE_OTHER): Payer: 59 | Admitting: Dietician

## 2017-06-02 ENCOUNTER — Ambulatory Visit (INDEPENDENT_AMBULATORY_CARE_PROVIDER_SITE_OTHER): Payer: 59 | Admitting: Family Medicine

## 2017-06-02 DIAGNOSIS — M2142 Flat foot [pes planus] (acquired), left foot: Secondary | ICD-10-CM | POA: Diagnosis not present

## 2017-06-02 DIAGNOSIS — M2141 Flat foot [pes planus] (acquired), right foot: Secondary | ICD-10-CM | POA: Diagnosis not present

## 2017-06-02 DIAGNOSIS — M2170 Unequal limb length (acquired), unspecified site: Secondary | ICD-10-CM

## 2017-06-02 DIAGNOSIS — M66872 Spontaneous rupture of other tendons, left ankle and foot: Secondary | ICD-10-CM | POA: Diagnosis not present

## 2017-06-03 ENCOUNTER — Ambulatory Visit (INDEPENDENT_AMBULATORY_CARE_PROVIDER_SITE_OTHER): Payer: 59 | Admitting: Dietician

## 2017-06-03 VITALS — Ht 71.0 in | Wt 262.0 lb

## 2017-06-03 DIAGNOSIS — Z9189 Other specified personal risk factors, not elsewhere classified: Secondary | ICD-10-CM | POA: Diagnosis not present

## 2017-06-03 DIAGNOSIS — Z6836 Body mass index (BMI) 36.0-36.9, adult: Secondary | ICD-10-CM | POA: Diagnosis not present

## 2017-06-03 DIAGNOSIS — E8881 Metabolic syndrome: Secondary | ICD-10-CM

## 2017-06-03 NOTE — Assessment & Plan Note (Signed)
Patient is presenting for custom orthotics today. These were made today and patient stated they were very comfortable. - More material was left on the right side to make up for the left-sided leg length discrepancy. - patient follow-up in 4 weeks >> Strong consideration for additional heel lift at that time ( this was not added today to prevent intolerance to major correction and changes)

## 2017-06-03 NOTE — Assessment & Plan Note (Signed)
See above

## 2017-06-03 NOTE — Progress Notes (Signed)
HPI  CC: left ankle pain Patient is here for follow-up with her right ankle pain. She states that the shoe inserts that we provided her at the last visit has provided some improvement in her ankle pain. She is interested in custom orthotics at this time. Patient continues to have issues with her gait, as well as pain within her calf. She says that the majority of the pain in her calf has resolved but she differently has persistent achiness. She denies any recent setbacks, falls, or injuries.  Medications/Interventions Tried: shoe inserts  See HPI and/or previous note for associated ROS.  Objective: BP 130/80   Ht 5\' 11"  (1.803 m)   Wt 253 lb (114.8 kg)   LMP 04/17/2012   BMI 35.29 kg/m  Gen: NAD, well groomed, a/o x3, normal affect.  CV: Well-perfused. Warm.  Resp: Non-labored.  Neuro: Sensation intact throughout. No gross coordination deficits.  Gait: Nonpathologic posture, no signs of balance issues. Positive Trendelenburg gait with forceful foot strike on the left. Knee, bilateral: Well-healed surgical scarring noted bilaterally. Range of motion full on the right, only slight limitations in flexion on the left. Ankle/Foot, Left: TTP noted at the posterior medial aspect of the ankle with radiation up into the calf (improved from previous). No visible erythema, swelling, ecchymosis, or bony deformity. Severe pes planus deformity bilaterally. Transverse arch grossly intact; Mild/moderate evidence of tibiotalar deviation (L>R); Range of motion is full in all directions. Strength is 5/5 in all directions. No tenderness at the insertion/body/myotendinous junction of the Achilles tendon; No peroneal tendon tenderness or subluxation; No tenderness on posterior aspects of lateral and medial malleolus; Stable lateral and medial ligaments; Unremarkable squeeze and kleiger tests; Talar dome with mild TTP; Unremarkable calcaneal squeeze; No plantar calcaneal tenderness; Mild tenderness at the plantar  surface of the navicular; No tenderness over cuboid; No pain at base of 5th MT; No tenderness at the distal metatarsals; Able to walk 4 steps. Leg Length: Severe leg length discrepancy with measurement from ASIS to medial malleolus 93cm on right, and 97cm on left   Custom Orthotics: - Patient was fitted for a standard, cushioned, semi-rigid orthotic. - Orthotic was heated and afterward the patient stood on the orthotic blank positioned on the orthotic stand. - The patient was positioned in subtalar neutral position and 10 degrees of ankle dorsiflexion in a weight bearing stance. - After completion of molding, a stable base was applied to the orthotic blank. - The blank was ground to a stable position for weight bearing. - Base: Blue EVA - Additional Posting and Padding: none at this time (grinded less of the Rt EVA down to provide some additional length/height) - The patient ambulated in these, and they were very comfortable.   Assessment and plan:  Tibialis posterior tendon tear, nontraumatic, left Patient is presenting for custom orthotics today. These were made today and patient stated they were very comfortable. - More material was left on the right side to make up for the left-sided leg length discrepancy. - patient follow-up in 4 weeks >> Strong consideration for additional heel lift at that time ( this was not added today to prevent intolerance to major correction and changes)  Leg length discrepancy See above  Flat foot See above  I spent 30 minutes with this patient. Over 50% of visit was spent in counseling and coordination of care for problems with pes planus   Elberta Leatherwood, MD,MS [facility] Fellow 06/03/2017 6:14 PM

## 2017-06-03 NOTE — Progress Notes (Signed)
  Office: [phone]  /  Fax: [phone]     Caydence has a diagnosis of insulin resistance based on her elevated fasting insulin level >5. Although Jasani's blood glucose readings are still under good control, insulin resistance puts her at greater risk of metabolic syndrome and diabetes. She is here today for diabetes prevention nutrition counseling. She has been on Cipro and Flagyl for the past 2 weeks for diverticulitis. Her appetite has been poor and she admits not drinking enough of any fluids. Her weight is up 4# since her last visit however she is retaining some fluid. Discussed healthy eating while not feeling well and increasing her fluid intake.   Discussed getting back to her category meal plan when she is feeling better. Getting her lean protein and avoiding simple carbohydrates was discussed in detail. Discussed other meal plan options and she wants to get back to the category 3 meal plan when she is feeling better.   Her meal plan was individualized for maximum benefit.  Also discussed at length the following behavioral modifications to help maximize  success increasing lean protein intake, decreasing simple carbohydrates,  increase water intake, and appropriate fiber when able to get back to her meal plan.   Kendel has been instructed to work up to a goal of 150 minutes of combined cardio and strengthening exercise per week for weight loss and overall health benefits.    Office: [phone]  /  Fax: [phone]  OBESITY BEHAVIORAL INTERVENTION VISIT  Today's visit was # 8 out of 22.  Starting weight: 277 lbs  Starting date: 01/18/17 Today's weight : Weight: 262 lb (118.8 kg)  Today's date:06/03/17 Total lbs lost to date: 15 lb (Patients must lose 7 lbs in the first 6 months to continue with counseling)   ASK: We discussed the diagnosis of obesity with [name] today and Jandi agreed to give [place] permission to discuss obesity behavioral modification therapy  today.  ASSESS: Rosebud has the diagnosis of obesity and her BMI today is 36.56 Chika is in the action stage of change   ADVISE: Taylan was educated on the multiple health risks of obesity as well as the benefit of weight loss to improve her health. She was advised of the need for long term treatment and the importance of lifestyle modifications.  AGREE: Multiple dietary modification options and treatment options were discussed and  Blondie agreed to follow the Category 3 plan We discussed the following Behavioral Modification Stratagies today: increasing lean protein intake and decreasing simple carbohydrates

## 2017-06-04 ENCOUNTER — Encounter (INDEPENDENT_AMBULATORY_CARE_PROVIDER_SITE_OTHER): Payer: Self-pay | Admitting: Physician Assistant

## 2017-06-04 ENCOUNTER — Other Ambulatory Visit: Payer: Self-pay

## 2017-06-04 ENCOUNTER — Telehealth: Payer: Self-pay | Admitting: Nurse Practitioner

## 2017-06-04 MED ORDER — AMOXICILLIN-POT CLAVULANATE 875-125 MG PO TABS: 1.0000 | ORAL_TABLET | Freq: Two times a day (BID) | ORAL | 0 refills | Status: DC

## 2017-06-04 NOTE — Telephone Encounter (Signed)
Discussed and scheduled with the patient
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
## 2017-06-09 ENCOUNTER — Encounter (INDEPENDENT_AMBULATORY_CARE_PROVIDER_SITE_OTHER): Payer: Self-pay

## 2017-06-09 ENCOUNTER — Ambulatory Visit (INDEPENDENT_AMBULATORY_CARE_PROVIDER_SITE_OTHER): Payer: 59 | Admitting: Physician Assistant

## 2017-06-16 ENCOUNTER — Ambulatory Visit: Payer: 59 | Admitting: Nurse Practitioner

## 2017-06-16 ENCOUNTER — Ambulatory Visit: Payer: 59 | Admitting: Family Medicine

## 2017-06-23 ENCOUNTER — Other Ambulatory Visit: Payer: Self-pay

## 2017-06-23 ENCOUNTER — Other Ambulatory Visit: Payer: Self-pay | Admitting: Internal Medicine

## 2017-06-23 ENCOUNTER — Other Ambulatory Visit: Payer: Self-pay | Admitting: Physician Assistant

## 2017-06-23 ENCOUNTER — Ambulatory Visit (HOSPITAL_COMMUNITY): Payer: 59 | Attending: Cardiovascular Disease

## 2017-06-23 DIAGNOSIS — Z950 Presence of cardiac pacemaker: Secondary | ICD-10-CM | POA: Diagnosis not present

## 2017-06-23 DIAGNOSIS — Z6836 Body mass index (BMI) 36.0-36.9, adult: Secondary | ICD-10-CM | POA: Diagnosis not present

## 2017-06-23 DIAGNOSIS — I480 Paroxysmal atrial fibrillation: Secondary | ICD-10-CM | POA: Diagnosis not present

## 2017-06-23 DIAGNOSIS — I071 Rheumatic tricuspid insufficiency: Secondary | ICD-10-CM | POA: Diagnosis not present

## 2017-06-23 DIAGNOSIS — I495 Sick sinus syndrome: Secondary | ICD-10-CM | POA: Diagnosis not present

## 2017-06-23 DIAGNOSIS — E669 Obesity, unspecified: Secondary | ICD-10-CM | POA: Insufficient documentation

## 2017-06-23 NOTE — Telephone Encounter (Signed)
Pt last saw Dr Rayann Heman on 01/06/17, last labs 05/18/17 Creat 0.84, age 56, weight 118.8kg, CrCl 143.59, based on CrCl pt is on appropriate dosage of Xarelto 20mg  QD.  Will refill rx.

## 2017-06-25 ENCOUNTER — Telehealth: Payer: Self-pay | Admitting: Internal Medicine

## 2017-06-25 NOTE — Telephone Encounter (Signed)
Destiny Dennis is returning your call

## 2017-06-25 NOTE — Telephone Encounter (Signed)
Spoke with patient regarding her Echocardiogram results.  She verbalized understanding

## 2017-06-28 ENCOUNTER — Telehealth: Payer: Self-pay | Admitting: Nurse Practitioner

## 2017-06-28 NOTE — Telephone Encounter (Signed)
Left message for patient to call. I would like to offer her BlueSync monitoring for her pacemaker. We could see her Monday [facility]) or Tuesday (hospital) with research.  She is also due to see Dr Rayann Heman and he could see her those days as well (either [facility] or hospital).  If Monday, would need to be late morning, early afternoon. If Tuesday, early afternoon would be better.  I have asked her to call back and speak with Claiborne Billings as I am off the rest of the week  Chanetta Marshall, NP 06/28/2017 10:16 AM

## 2017-06-28 NOTE — Progress Notes (Unsigned)
Spoke with patient on 11/9 informing her of the echocardiogram results. She verbalized understanding

## 2017-06-28 NOTE — Telephone Encounter (Addendum)
Spoke with patient and have scheduled her to see Chanetta Marshall, NP on Mon 07/05/17.

## 2017-07-05 ENCOUNTER — Ambulatory Visit (INDEPENDENT_AMBULATORY_CARE_PROVIDER_SITE_OTHER): Payer: 59 | Admitting: Nurse Practitioner

## 2017-07-05 VITALS — BP 122/64 | HR 76 | Ht 71.0 in | Wt 252.0 lb

## 2017-07-05 DIAGNOSIS — I495 Sick sinus syndrome: Secondary | ICD-10-CM | POA: Diagnosis not present

## 2017-07-05 DIAGNOSIS — E6609 Other obesity due to excess calories: Secondary | ICD-10-CM

## 2017-07-05 DIAGNOSIS — I481 Persistent atrial fibrillation: Secondary | ICD-10-CM

## 2017-07-05 DIAGNOSIS — I519 Heart disease, unspecified: Secondary | ICD-10-CM | POA: Diagnosis not present

## 2017-07-05 DIAGNOSIS — I4819 Other persistent atrial fibrillation: Secondary | ICD-10-CM

## 2017-07-05 DIAGNOSIS — Z6835 Body mass index (BMI) 35.0-35.9, adult: Secondary | ICD-10-CM

## 2017-07-05 NOTE — Progress Notes (Signed)
Electrophysiology Office Note Date: 07/12/2017  ID:  Destiny Dennis, DOB 04-24-1961, MRN [id number]  PCP: Lucretia Kern, DO Primary Cardiologist: Stanford Breed Electrophysiologist: Allred CT Surgery: Roxy Manns  CC: Pacemaker/AF follow-up  Destiny Dennis is a 56 y.o. female seen today for Dr Rayann Heman.  She presents today for routine electrophysiology followup.  Since last being seen in our clinic, the patient reports doing very well. She has had no noticeable AF. She is working on weight loss and has lost 20 pounds.  She was recently seen by Dr Roxy Manns.  She denies chest pain, palpitations, dyspnea, PND, orthopnea, nausea, vomiting, dizziness, syncope, edema, weight gain, or early satiety.   Device History: MDT CRTP implanted 2017 for tachy/brady syndrome, NICM   Past Medical History:  Diagnosis Date  . Allergic rhinitis   . Anxiety    when tachycardia occurs  . Arthritis    OA, s/p numerous surgeries -back, knees, hip  . Atrial flutter ([place])   . Atrial septal defect 01/01/2016   Discovered on TEE   . Benign fundic gland polyps of stomach   . Cervical disc disease   . Complication of anesthesia   . Diverticulitis    CT Scan   . DVT (deep venous thrombosis) ([place])    in pregnancy, s/p hip surgery  . Eustachian tube dysfunction   . Fatty liver   . Gallbladder problem   . GERD 12/23/2009   diet related-not a problem  . Headache(784.0)    migraines occ.  Marland Kitchen Hemorrhoids   . Hiatal hernia    small  . History of MRSA infection 2008   superficial skin-cleared easily with doxycycline  . Hypertension   . Left ankle pain   . Left knee pain   . Obesity   . Peripheral vascular disease ([place])    left leg after hip surgery  . Persistent atrial fibrillation ([place]) 03/05/2007   a. s/p RFCA 12/18/2011, 07/29/12  . PONV (postoperative nausea and vomiting)   . s/p atrial septal defect repair 05/28/2016  . S/P Maze operation for atrial fibrillation 05/28/2016   Complete bilateral atrial lesion  set using cryothermy and bipolar radiofrequency ablation with clipping of LA appendage via median sternotomy  . Sialoadenitis   . SVT (supraventricular tachycardia) ([place]) 03/18/2010   PVCs  . Tachy-brady syndrome [facility])    Past Surgical History:  Procedure Laterality Date  . ASD REPAIR N/A 05/28/2016   Procedure: ATRIAL SEPTAL DEFECT (ASD) closure;  Surgeon: Rexene Alberts, MD;  Location: [place];  Service: Open Heart Surgery;  Laterality: N/A;  . ATRIAL FIBRILLATION ABLATION N/A 12/18/2011   Procedure: ATRIAL FIBRILLATION ABLATION;  Surgeon: [name], MD;  Location: [facility] CATH LAB;  Service: Cardiovascular;  Laterality: N/A;  . ATRIAL FIBRILLATION ABLATION N/A 07/29/2012   Procedure: ATRIAL FIBRILLATION ABLATION;  Surgeon: [name], MD;  Location[facility] CATH LAB;  Service: Cardiovascular;  Laterality: N/A;  . CARDIAC CATHETERIZATION N/A 04/10/2016   Procedure: Right/Left Heart Cath and Coronary Angiography;  Surgeon: Jettie Booze, MD;  Location: [place] CV LAB;  Service: Cardiovascular;  Laterality: N/A;  . CERVICAL FUSION    . CESAREAN SECTION     x 2  . CHOLECYSTECTOMY    . CLIPPING OF ATRIAL APPENDAGE  05/28/2016   Procedure: CLIPPING OF ATRIAL APPENDAGE;  Surgeon: Rexene Alberts, MD;  Location: [place];  Service: Open Heart Surgery;;  . COLONOSCOPY  07/16/2011   diverticulosis  . ELBOW SURGERY     Right  .  EP IMPLANTABLE DEVICE N/A 09/10/2015   Procedure: Loop Recorder Insertion;  Surgeon: [name], MD;  Location: [place] CV LAB;  Service: Cardiovascular;  Laterality: N/A;  . EP IMPLANTABLE DEVICE N/A 06/08/2016   MDT Marcelino Scot CRT-P implanted by Dr Curt Bears  . FLEXIBLE SIGMOIDOSCOPY     1990's  . FOOT SURGERY     Right   . I&D KNEE WITH POLY EXCHANGE Left 12/12/2012   Procedure: LEFT KNEE EXCISION SAPHENOUS NEUROMA/OPEN SCAR DEBRIDEMENT/POLY EXCHANGE/NERVE EXCISION;  Surgeon: Mauri Pole, MD;  Location: [facility];  Service: Orthopedics;  Laterality: Left;  .  JOINT REPLACEMENT Bilateral    4'14  knees  . KNEE SURGERY     x 9 Left Knee  . MAZE  05/28/2016   Procedure: MAZE;  Surgeon: Rexene Alberts, MD;  Location: [place];  Service: Open Heart Surgery;;  . MEDIASTERNOTOMY N/A 05/28/2016   Procedure: MEDIAN STERNOTOMY;  Surgeon: Rexene Alberts, MD;  Location: [place];  Service: Open Heart Surgery;  Laterality: N/A;  . REDUCTION MAMMAPLASTY Bilateral   . SHOULDER SURGERY     Right   . TEE WITHOUT CARDIOVERSION  12/17/2011   Procedure: TRANSESOPHAGEAL ECHOCARDIOGRAM (TEE);  Surgeon: Larey Dresser, MD;  Location: [facility] ENDOSCOPY;  Service: Cardiovascular;  Laterality: N/A;  . TEE WITHOUT CARDIOVERSION  07/28/2012   Procedure: TRANSESOPHAGEAL ECHOCARDIOGRAM (TEE);  Surgeon: Thayer Headings, MD;  Location: [place];  Service: Cardiovascular;  Laterality: N/A;  . TEE WITHOUT CARDIOVERSION N/A 01/01/2016   Procedure: TRANSESOPHAGEAL ECHOCARDIOGRAM (TEE);  Surgeon: Sanda Klein, MD;  Location: [facility] ENDOSCOPY;  Service: Cardiovascular;  Laterality: N/A;  . TEE WITHOUT CARDIOVERSION N/A 05/28/2016   Procedure: TRANSESOPHAGEAL ECHOCARDIOGRAM (TEE);  Surgeon: Rexene Alberts, MD;  Location: [place];  Service: Open Heart Surgery;  Laterality: N/A;  . TOTAL HIP ARTHROPLASTY Left 10/17/2013   Procedure: LEFT TOTAL HIP ARTHROPLASTY ANTERIOR APPROACH;  Surgeon: Mauri Pole, MD;  Location: [facility];  Service: Orthopedics;  Laterality: Left;  . TOTAL KNEE ARTHROPLASTY Right 12/12/2012   Procedure: RIGHT TOTAL KNEE ARTHROPLASTY;  Surgeon: Mauri Pole, MD;  Location: [facility];  Service: Orthopedics;  Laterality: Right;  . TUBAL LIGATION    . UPPER GASTROINTESTINAL ENDOSCOPY  01/30/2010   hiatal hernia, fundic gland polyps  . WRIST SURGERY     Left     Current Outpatient Medications  Medication Sig Dispense Refill  . acetaminophen (TYLENOL) 325 MG tablet Take 1-2 tablets (325-650 mg total) by mouth every 6 (six) hours as needed for mild pain or headache (For pain.).      Marland Kitchen amoxicillin (AMOXIL) 500 MG capsule Take 2,000 mg by mouth daily as needed. Take 1 hour prior to dental appointment.  4  . amoxicillin-clavulanate (AUGMENTIN) 875-125 MG tablet Take 1 tablet by mouth 2 (two) times daily. 20 tablet 0  . ciprofloxacin (CIPRO) 500 MG tablet Take 1 tablet (500 mg total) by mouth 2 (two) times daily. (Patient not taking: Reported on 05/24/2017) 14 tablet 0  . diazepam (VALIUM) 5 MG tablet Take 5 mg by mouth every 6 (six) hours as needed for muscle spasms.     . metoprolol succinate (TOPROL-XL) 100 MG 24 hr tablet TAKE ONE TABLET BY MOUTH IN THE AM AND HALF A TABLET IN THE PM DAILY Take with or immediately following a meal.    . Multiple Vitamin (MULTIVITAMIN) capsule Take 1 capsule by mouth daily.    . ondansetron (ZOFRAN) 4 MG tablet Take 1 tablet (4  mg total) by mouth every 8 (eight) hours as needed for nausea or vomiting. 40 tablet 0  . pantoprazole (PROTONIX) 40 MG tablet TAKE 1 TABLET (40 MG TOTAL) BY MOUTH DAILY AS NEEDED (HEARTBURN). 90 tablet 1  . polyethylene glycol powder (GLYCOLAX/MIRALAX) powder Take 17 g by mouth daily. 3350 g 0  . Vitamin D, Ergocalciferol, (DRISDOL) 50000 units CAPS capsule Take 1 capsule (50,000 Units total) by mouth every 7 (seven) days. 4 capsule 0  . XARELTO 20 MG TABS tablet TAKE 1 TABLET AT BEDTIME 30 tablet 6   No current facility-administered medications for this visit.     Allergies:   Avelox [moxifloxacin hcl in nacl]   Social History: Social History   Socioeconomic History  . Marital status: Married    Spouse name: Not on file  . Number of children: 2  . Years of education: Not on file  . Highest education level: Not on file  Social Needs  . Financial resource strain: Not on file  . Food insecurity - worry: Not on file  . Food insecurity - inability: Not on file  . Transportation needs - medical: Not on file  . Transportation needs - non-medical: Not on file  Occupational History  . Occupation: Medical case  Freight forwarder    Comment: Workers Comp  Tobacco Use  . Smoking status: Never Smoker  . Smokeless tobacco: Never Used  Substance and Sexual Activity  . Alcohol use: Yes    Comment: rare social  . Drug use: No  . Sexual activity: Yes    Comment: has periods q 3-4 months- no possiblity pregnant per pt.07-16-11  Other Topics Concern  . Not on file  Social History Narrative   Updated 06/2015   Daily Caffeine    Pt lives in [place] with spouse and two children 42 and 36 yo in 2-16.  She works as a workers Dance movement psychotherapist.   Christian   No regular exercise, diet ok    Family History: Family History  Problem Relation Age of Onset  . Diabetes Mother   . Hyperlipidemia Mother   . Liver disease Father   . Obesity Father   . Diabetes Maternal Grandfather   . Diverticulitis Brother        x 2  . Diverticulitis Paternal Grandmother   . Colon cancer Neg Hx   . Breast cancer Neg Hx      Review of Systems: All other systems reviewed and are otherwise negative except as noted above.   Physical Exam: VS:  LMP 04/17/2012  , BMI There is no height or weight on file to calculate BMI.  GEN- The patient is well appearing, alert and oriented x 3 today.   HEENT: normocephalic, atraumatic; sclera clear, conjunctiva pink; hearing intact; oropharynx clear; neck supple  Lungs- Clear to ausculation bilaterally, normal work of breathing.  No wheezes, rales, rhonchi Heart- Regular rate and rhythm, no murmurs, rubs or gallops  GI- soft, non-tender, non-distended, bowel sounds present  Extremities- no clubbing, cyanosis, or edema  MS- no significant deformity or atrophy Skin- warm and dry, no rash or lesion; PPM pocket well healed Psych- euthymic mood, full affect Neuro- strength and sensation are intact  PPM Interrogation- reviewed in detail today,  See PACEART report  EKG:  EKG is not ordered today.  Recent Labs: 01/18/2017: Hemoglobin 11.9; TSH 1.650 05/18/2017: ALT 17; BUN 19; Creatinine,  Ser 0.84; Potassium 4.5; Sodium 143   Wt Readings from Last 3 Encounters:  06/03/17 262  lb (118.8 kg)  06/02/17 253 lb (114.8 kg)  05/24/17 266 lb (120.7 kg)     Other studies Reviewed: Additional studies/ records that were reviewed today include: Dr Jackalyn Lombard office notes  Assessment and Plan:  1.  Tachy/brady syndrome Normal PPM function See Pace Art report No changes today  2.  Persistent atrial fibrillation Maintaining SR off AAD therapy No recurrence by device interrogation today CHADS2VASC is 1  3.  LV dysfunction EF normalized post CRT implant   4.  Obesity Continued weight loss encouraged Body mass index is 35.15 kg/m.  Current medicines are reviewed at length with the patient today.   The patient does not have concerns regarding her medicines.  The following changes were made today:  none  Labs/ tests ordered today include: none No orders of the defined types were placed in this encounter.    Disposition:   Follow up with [facility], Dr Rayann Heman 1 year, Dr Stanford Breed as scheduled      Signed, Chanetta Marshall, NP 07/12/2017 10:05 AM  [place]  [id number] [phone] (office) [phone] (fax)

## 2017-07-07 ENCOUNTER — Ambulatory Visit (INDEPENDENT_AMBULATORY_CARE_PROVIDER_SITE_OTHER): Payer: 59 | Admitting: *Deleted

## 2017-07-07 DIAGNOSIS — I495 Sick sinus syndrome: Secondary | ICD-10-CM | POA: Diagnosis not present

## 2017-07-07 NOTE — Progress Notes (Signed)
Remote pacemaker transmission.   

## 2017-07-12 ENCOUNTER — Telehealth (INDEPENDENT_AMBULATORY_CARE_PROVIDER_SITE_OTHER): Payer: Self-pay | Admitting: Physician Assistant

## 2017-07-12 ENCOUNTER — Encounter: Payer: Self-pay | Admitting: Nurse Practitioner

## 2017-07-12 NOTE — Telephone Encounter (Signed)
Spoke with Bailey Mech and asked her to have the patient contact the office to make an appt for any refills needed. April, [place]

## 2017-07-12 NOTE — Telephone Encounter (Signed)
Destiny Dennis FROM [facility] 3852 AT [place] REQUESTING REFILL REQUEST FOR VIT D. [place]

## 2017-07-15 ENCOUNTER — Encounter: Payer: Self-pay | Admitting: Family Medicine

## 2017-07-15 ENCOUNTER — Ambulatory Visit (INDEPENDENT_AMBULATORY_CARE_PROVIDER_SITE_OTHER): Payer: 59 | Admitting: Family Medicine

## 2017-07-15 VITALS — BP 120/82 | HR 79 | Temp 98.3°F | Ht 71.0 in | Wt 264.1 lb

## 2017-07-15 DIAGNOSIS — J069 Acute upper respiratory infection, unspecified: Secondary | ICD-10-CM | POA: Diagnosis not present

## 2017-07-15 DIAGNOSIS — Z23 Encounter for immunization: Secondary | ICD-10-CM | POA: Diagnosis not present

## 2017-07-15 DIAGNOSIS — J029 Acute pharyngitis, unspecified: Secondary | ICD-10-CM | POA: Diagnosis not present

## 2017-07-15 DIAGNOSIS — J0110 Acute frontal sinusitis, unspecified: Secondary | ICD-10-CM | POA: Diagnosis not present

## 2017-07-15 LAB — POCT RAPID STREP A (OFFICE): RAPID STREP A SCREEN: NEGATIVE

## 2017-07-15 MED ORDER — AMOXICILLIN-POT CLAVULANATE 875-125 MG PO TABS: 1.0000 | ORAL_TABLET | Freq: Two times a day (BID) | ORAL | 0 refills | Status: DC

## 2017-07-15 MED ORDER — HYDROCODONE-HOMATROPINE 5-1.5 MG/5ML PO SYRP: 5.0000 mL | ORAL_SOLUTION | Freq: Three times a day (TID) | ORAL | 0 refills | Status: DC | PRN

## 2017-07-15 NOTE — Progress Notes (Signed)
HPI:  Acute visit for respiratory illness: -started: 9-10 days ago -symptoms:nasal congestion, sore throat, cough, sinus pain the last few days in the frontal region, thick nasal congestion dripping down the throat -denies:fever, SOB, NVD, tooth pain -has tried: Over-the-counter medications -sick contacts/travel/risks: no reported flu, strep or tick exposure -Hx of: Allergic rhinitis ROS: See pertinent positives and negatives per HPI.  Past Medical History:  Diagnosis Date  . Allergic rhinitis   . Anxiety    when tachycardia occurs  . Arthritis    OA, s/p numerous surgeries -back, knees, hip  . Benign fundic gland polyps of stomach   . Cervical disc disease   . Diverticulitis    CT Scan   . DVT (deep venous thrombosis) ([place])    in pregnancy, s/p hip surgery  . Eustachian tube dysfunction   . Fatty liver   . Gallbladder problem   . GERD 12/23/2009   diet related-not a problem  . Hemorrhoids   . Hiatal hernia    small  . History of MRSA infection 2008   superficial skin-cleared easily with doxycycline  . Obesity   . Peripheral vascular disease ([place])    left leg after hip surgery  . Persistent atrial fibrillation ([place]) 03/05/2007   a. s/p RFCA 12/18/2011, 07/29/12  . s/p atrial septal defect repair 05/28/2016  . S/P Maze operation for atrial fibrillation 05/28/2016   Complete bilateral atrial lesion set using cryothermy and bipolar radiofrequency ablation with clipping of LA appendage via median sternotomy  . Sialoadenitis   . Tachy-brady syndrome [facility])     Past Surgical History:  Procedure Laterality Date  . ASD REPAIR N/A 05/28/2016   Procedure: ATRIAL SEPTAL DEFECT (ASD) closure;  Surgeon: Rexene Alberts, MD;  Location: [place];  Service: Open Heart Surgery;  Laterality: N/A;  . ATRIAL FIBRILLATION ABLATION N/A 12/18/2011   Procedure: ATRIAL FIBRILLATION ABLATION;  Surgeon: [name], MD;  Location: [facility] CATH LAB;  Service: Cardiovascular;  Laterality: N/A;  . ATRIAL  FIBRILLATION ABLATION N/A 07/29/2012   Procedure: ATRIAL FIBRILLATION ABLATION;  Surgeon: [name], MD;  Location: [facility] CATH LAB;  Service: Cardiovascular;  Laterality: N/A;  . CARDIAC CATHETERIZATION N/A 04/10/2016   Procedure: Right/Left Heart Cath and Coronary Angiography;  Surgeon: Jettie Booze, MD;  Location: [place] CV LAB;  Service: Cardiovascular;  Laterality: N/A;  . CERVICAL FUSION    . CESAREAN SECTION     x 2  . CHOLECYSTECTOMY    . CLIPPING OF ATRIAL APPENDAGE  05/28/2016   Procedure: CLIPPING OF ATRIAL APPENDAGE;  Surgeon: Rexene Alberts, MD;  Location: [place];  Service: Open Heart Surgery;;  . COLONOSCOPY  07/16/2011   diverticulosis  . ELBOW SURGERY     Right  . EP IMPLANTABLE DEVICE N/A 09/10/2015   Procedure: Loop Recorder Insertion;  Surgeon: [name], MD;  Location: [place] CV LAB;  Service: Cardiovascular;  Laterality: N/A;  . EP IMPLANTABLE DEVICE N/A 06/08/2016   MDT Marcelino Scot CRT-P implanted by Dr Curt Bears  . FLEXIBLE SIGMOIDOSCOPY     1990's  . FOOT SURGERY     Right   . I&D KNEE WITH POLY EXCHANGE Left 12/12/2012   Procedure: LEFT KNEE EXCISION SAPHENOUS NEUROMA/OPEN SCAR DEBRIDEMENT/POLY EXCHANGE/NERVE EXCISION;  Surgeon: Mauri Pole, MD;  Location: [facility];  Service: Orthopedics;  Laterality: Left;  . JOINT REPLACEMENT Bilateral    4'14  knees  . KNEE SURGERY     x 9 Left Knee  . MAZE  05/28/2016   Procedure: MAZE;  Surgeon: Rexene Alberts, MD;  Location: [place];  Service: Open Heart Surgery;;  . MEDIASTERNOTOMY N/A 05/28/2016   Procedure: MEDIAN STERNOTOMY;  Surgeon: Rexene Alberts, MD;  Location: [place];  Service: Open Heart Surgery;  Laterality: N/A;  . REDUCTION MAMMAPLASTY Bilateral   . SHOULDER SURGERY     Right   . TEE WITHOUT CARDIOVERSION  12/17/2011   Procedure: TRANSESOPHAGEAL ECHOCARDIOGRAM (TEE);  Surgeon: Larey Dresser, MD;  Location: [facility] ENDOSCOPY;  Service: Cardiovascular;  Laterality: N/A;  . TEE WITHOUT  CARDIOVERSION  07/28/2012   Procedure: TRANSESOPHAGEAL ECHOCARDIOGRAM (TEE);  Surgeon: Thayer Headings, MD;  Location: [place];  Service: Cardiovascular;  Laterality: N/A;  . TEE WITHOUT CARDIOVERSION N/A 01/01/2016   Procedure: TRANSESOPHAGEAL ECHOCARDIOGRAM (TEE);  Surgeon: Sanda Klein, MD;  Location: [facility] ENDOSCOPY;  Service: Cardiovascular;  Laterality: N/A;  . TEE WITHOUT CARDIOVERSION N/A 05/28/2016   Procedure: TRANSESOPHAGEAL ECHOCARDIOGRAM (TEE);  Surgeon: Rexene Alberts, MD;  Location: [place];  Service: Open Heart Surgery;  Laterality: N/A;  . TOTAL HIP ARTHROPLASTY Left 10/17/2013   Procedure: LEFT TOTAL HIP ARTHROPLASTY ANTERIOR APPROACH;  Surgeon: Mauri Pole, MD;  Location: [facility];  Service: Orthopedics;  Laterality: Left;  . TOTAL KNEE ARTHROPLASTY Right 12/12/2012   Procedure: RIGHT TOTAL KNEE ARTHROPLASTY;  Surgeon: Mauri Pole, MD;  Location: [facility];  Service: Orthopedics;  Laterality: Right;  . TUBAL LIGATION    . UPPER GASTROINTESTINAL ENDOSCOPY  01/30/2010   hiatal hernia, fundic gland polyps  . WRIST SURGERY     Left     Family History  Problem Relation Age of Onset  . Diabetes Mother   . Hyperlipidemia Mother   . Liver disease Father   . Obesity Father   . Diabetes Maternal Grandfather   . Diverticulitis Brother        x 2  . Diverticulitis Paternal Grandmother   . Colon cancer Neg Hx   . Breast cancer Neg Hx     Social History   Socioeconomic History  . Marital status: Married    Spouse name: None  . Number of children: 2  . Years of education: None  . Highest education level: None  Social Needs  . Financial resource strain: None  . Food insecurity - worry: None  . Food insecurity - inability: None  . Transportation needs - medical: None  . Transportation needs - non-medical: None  Occupational History  . Occupation: Medical case Freight forwarder    Comment: Workers Comp  Tobacco Use  . Smoking status: Never Smoker  . Smokeless tobacco: Never  Used  Substance and Sexual Activity  . Alcohol use: Yes    Comment: rare social  . Drug use: No  . Sexual activity: Yes    Comment: has periods q 3-4 months- no possiblity pregnant per pt.07-16-11  Other Topics Concern  . None  Social History Narrative   Updated 06/2015   Daily Caffeine    Pt lives in [place] with spouse and two children 77 and 62 yo in 2-16.  She works as a workers Dance movement psychotherapist.   Christian   No regular exercise, diet ok     Current Outpatient Medications:  .  acetaminophen (TYLENOL) 325 MG tablet, Take 1-2 tablets (325-650 mg total) by mouth every 6 (six) hours as needed for mild pain or headache (For pain.)., Disp: , Rfl:  .  amoxicillin (AMOXIL) 500 MG capsule, Take 2,000 mg by mouth daily  as needed. Take 1 hour prior to dental appointment., Disp: , Rfl: 4 .  diazepam (VALIUM) 5 MG tablet, Take 5 mg by mouth every 6 (six) hours as needed for muscle spasms. , Disp: , Rfl:  .  metoprolol succinate (TOPROL-XL) 100 MG 24 hr tablet, TAKE ONE TABLET BY MOUTH IN THE AM AND HALF A TABLET IN THE PM DAILY Take with or immediately following a meal., Disp: , Rfl:  .  Multiple Vitamin (MULTIVITAMIN) capsule, Take 1 capsule by mouth daily., Disp: , Rfl:  .  ondansetron (ZOFRAN) 4 MG tablet, Take 1 tablet (4 mg total) by mouth every 8 (eight) hours as needed for nausea or vomiting., Disp: 40 tablet, Rfl: 0 .  pantoprazole (PROTONIX) 40 MG tablet, TAKE 1 TABLET (40 MG TOTAL) BY MOUTH DAILY AS NEEDED (HEARTBURN)., Disp: 90 tablet, Rfl: 1 .  polyethylene glycol powder (GLYCOLAX/MIRALAX) powder, Take 17 g by mouth daily., Disp: 3350 g, Rfl: 0 .  Vitamin D, Ergocalciferol, (DRISDOL) 50000 units CAPS capsule, Take 1 capsule (50,000 Units total) by mouth every 7 (seven) days., Disp: 4 capsule, Rfl: 0 .  XARELTO 20 MG TABS tablet, TAKE 1 TABLET AT BEDTIME, Disp: 30 tablet, Rfl: 6 .  amoxicillin-clavulanate (AUGMENTIN) 875-125 MG tablet, Take 1 tablet by mouth 2 (two) times daily.,  Disp: 20 tablet, Rfl: 0 .  HYDROcodone-homatropine (HYCODAN) 5-1.5 MG/5ML syrup, Take 5 mLs by mouth every 8 (eight) hours as needed for cough., Disp: 60 mL, Rfl: 0  EXAM:  Vitals:   07/15/17 1359  BP: 120/82  Pulse: 79  Temp: 98.3 F (36.8 C)    Body mass index is 36.83 kg/m.  GENERAL: vitals reviewed and listed above, alert, oriented, appears well hydrated and in no acute distress  HEENT: atraumatic, conjunttiva clear, no obvious abnormalities on inspection of external nose and ears, normal appearance of ear canals and TMs, thick nasal congestion, mild post oropharyngeal erythema with PND, no tonsillar edema or exudate, no sinus TTP  NECK: no obvious masses on inspection  LUNGS: clear to auscultation bilaterally, no wheezes, rales or clear rhonchi, good air movement,  CV: HRRR, no peripheral edema  MS: moves all extremities without noticeable abnormality  PSYCH: pleasant and cooperative, no obvious depression or anxiety  ASSESSMENT AND PLAN:  Discussed the following assessment and plan:  Upper respiratory tract infection, unspecified type  Acute frontal sinusitis, recurrence not specified  -given HPI and exam findings today, a serious infection or illness is unlikely. We discussed potential etiologies, with VURI for transitioning to sinusitis being most likely. We discussed treatment side effects, likely course, antibiotic misuse, transmission, and signs of developing a serious illness. -Hycodan for cough per her request after discussion of risks and contraindications and interactions.  Reports that nothing else works for the cough and it is keeping her up at night. -Augmentin for possible sinus infection, she is going to try nasal saline and humidifier for a few more days, but if worsening or not improving will start the antibiotic, she is aware of risks -of course, we advised to return or notify a doctor immediately if symptoms worsen or persist or new concerns  arise.    Patient Instructions  INSTRUCTIONS FOR UPPER RESPIRATORY INFECTION:  -plenty of rest and fluids  -start the antibiotic (augmentin) if worsening or not improving over the next few days  -nasal saline wash 2-3 times daily (use prepackaged nasal saline or bottled/distilled water if making your own)   -can use AFRIN nasal spray for drainage  and nasal congestion - but do NOT use longer then 3-4 days  -can use tylenol (in no history of liver disease) or ibuprofen (if no history of kidney disease, bowel bleeding or significant heart disease) as directed for aches and sorethroat  -in the winter time, using a humidifier at night is helpful (please follow cleaning instructions)  -if you are taking a cough medication - use only as directed, may also try a teaspoon of honey to coat the throat and throat lozenges. If given a cough medication with codeine or hydrocodone or other narcotic please be advised that this contains a strong and  potentially addicting medication. Please follow instructions carefully, take as little as possible and only use AS NEEDED for severe cough. Discuss potential side effects with your pharmacy. Please do not drive or operate machinery while taking these types of medications. Please do not take other sedating medications, drugs or alcohol while taking this medication without discussing with your doctor.  -for sore throat, salt water gargles can help  -follow up if you have fevers, facial pain, tooth pain, difficulty breathing or are worsening or symptoms persist longer then expected  Upper Respiratory Infection, Adult An upper respiratory infection (URI) is also known as the common cold. It is often caused by a type of germ (virus). Colds are easily spread (contagious). You can pass it to others by kissing, coughing, sneezing, or drinking out of the same glass. Usually, you get better in 1 to 3  weeks.  However, the cough can last for even longer. HOME CARE    Only take medicine as told by your doctor. Follow instructions provided above.  Drink enough water and fluids to keep your pee (urine) clear or pale yellow.  Get plenty of rest.  Return to work when your temperature is < 100 for 24 hours or as told by your doctor. You may use a face mask and wash your hands to stop your cold from spreading. GET HELP RIGHT AWAY IF:   After the first few days, you feel you are getting worse.  You have questions about your medicine.  You have chills, shortness of breath, or red spit (mucus).  You have pain in the face for more then 1-2 days, especially when you bend forward.  You have a fever, puffy (swollen) neck, pain when you swallow, or white spots in the back of your throat.  You have a bad headache, ear pain, sinus pain, or chest pain.  You have a high-pitched whistling sound when you breathe in and out (wheezing).  You cough up blood.  You have sore muscles or a stiff neck. MAKE SURE YOU:   Understand these instructions.  Will watch your condition.  Will get help right away if you are not doing well or get worse. Document Released: 01/20/2008 Document Revised: 10/26/2011 Document Reviewed: 11/08/2013 [facility] Patient Information 2015 [place], [place]. This information is not intended to replace advice given to you by your health care provider. Make sure you discuss any questions you have with your health care provider.    Colin Benton R., DO

## 2017-07-15 NOTE — Patient Instructions (Addendum)
INSTRUCTIONS FOR UPPER RESPIRATORY INFECTION:  -plenty of rest and fluids  -start the antibiotic (augmentin) if worsening or not improving over the next few days  -nasal saline wash 2-3 times daily (use prepackaged nasal saline or bottled/distilled water if making your own)   -can use AFRIN nasal spray for drainage and nasal congestion - but do NOT use longer then 3-4 days  -can use tylenol (in no history of liver disease) or ibuprofen (if no history of kidney disease, bowel bleeding or significant heart disease) as directed for aches and sorethroat  -in the winter time, using a humidifier at night is helpful (please follow cleaning instructions)  -if you are taking a cough medication - use only as directed, may also try a teaspoon of honey to coat the throat and throat lozenges. If given a cough medication with codeine or hydrocodone or other narcotic please be advised that this contains a strong and  potentially addicting medication. Please follow instructions carefully, take as little as possible and only use AS NEEDED for severe cough. Discuss potential side effects with your pharmacy. Please do not drive or operate machinery while taking these types of medications. Please do not take other sedating medications, drugs or alcohol while taking this medication without discussing with your doctor.  -for sore throat, salt water gargles can help  -follow up if you have fevers, facial pain, tooth pain, difficulty breathing or are worsening or symptoms persist longer then expected  Upper Respiratory Infection, Adult An upper respiratory infection (URI) is also known as the common cold. It is often caused by a type of germ (virus). Colds are easily spread (contagious). You can pass it to others by kissing, coughing, sneezing, or drinking out of the same glass. Usually, you get better in 1 to 3  weeks.  However, the cough can last for even longer. HOME CARE   Only take medicine as told by your  doctor. Follow instructions provided above.  Drink enough water and fluids to keep your pee (urine) clear or pale yellow.  Get plenty of rest.  Return to work when your temperature is < 100 for 24 hours or as told by your doctor. You may use a face mask and wash your hands to stop your cold from spreading. GET HELP RIGHT AWAY IF:   After the first few days, you feel you are getting worse.  You have questions about your medicine.  You have chills, shortness of breath, or red spit (mucus).  You have pain in the face for more then 1-2 days, especially when you bend forward.  You have a fever, puffy (swollen) neck, pain when you swallow, or white spots in the back of your throat.  You have a bad headache, ear pain, sinus pain, or chest pain.  You have a high-pitched whistling sound when you breathe in and out (wheezing).  You cough up blood.  You have sore muscles or a stiff neck. MAKE SURE YOU:   Understand these instructions.  Will watch your condition.  Will get help right away if you are not doing well or get worse. Document Released: 01/20/2008 Document Revised: 10/26/2011 Document Reviewed: 11/08/2013 [facility] Patient Information 2015 [place], [place]. This information is not intended to replace advice given to you by your health care provider. Make sure you discuss any questions you have with your health care provider.

## 2017-07-15 NOTE — Addendum Note (Signed)
Addended by: Agnes Lawrence on: 07/15/2017 02:45 PM   Modules accepted: Orders

## 2017-07-16 ENCOUNTER — Encounter: Payer: Self-pay | Admitting: Cardiology

## 2017-07-17 LAB — CULTURE, GROUP A STREP
MICRO NUMBER:: 81342402
SPECIMEN QUALITY: ADEQUATE

## 2017-07-22 ENCOUNTER — Ambulatory Visit (INDEPENDENT_AMBULATORY_CARE_PROVIDER_SITE_OTHER): Payer: Self-pay | Admitting: *Deleted

## 2017-07-22 DIAGNOSIS — I495 Sick sinus syndrome: Secondary | ICD-10-CM

## 2017-07-22 NOTE — Progress Notes (Signed)
Remote pacemaker transmission.   

## 2017-07-27 LAB — CUP PACEART REMOTE DEVICE CHECK
Battery Remaining Longevity: 111 mo
Battery Remaining Longevity: 114 mo
Battery Voltage: 3.01 V
Battery Voltage: 3.01 V
Brady Statistic AS VS Percent: 0.01 %
Brady Statistic RA Percent Paced: 99.97 %
Brady Statistic RA Percent Paced: 99.97 %
Brady Statistic RV Percent Paced: 10.98 %
Date Time Interrogation Session: 20181119180448
Date Time Interrogation Session: 20181206003823
Implantable Lead Implant Date: 20171023
Implantable Lead Implant Date: 20171023
Implantable Lead Implant Date: 20171023
Implantable Lead Implant Date: 20171023
Implantable Lead Location: 753858
Implantable Lead Location: 753859
Implantable Lead Model: 4298
Implantable Pulse Generator Implant Date: 20171023
Implantable Pulse Generator Implant Date: 20171023
Lead Channel Impedance Value: 1026 Ohm
Lead Channel Impedance Value: 1140 Ohm
Lead Channel Impedance Value: 304 Ohm
Lead Channel Impedance Value: 323 Ohm
Lead Channel Impedance Value: 342 Ohm
Lead Channel Impedance Value: 418 Ohm
Lead Channel Impedance Value: 418 Ohm
Lead Channel Impedance Value: 551 Ohm
Lead Channel Impedance Value: 589 Ohm
Lead Channel Impedance Value: 589 Ohm
Lead Channel Impedance Value: 589 Ohm
Lead Channel Impedance Value: 589 Ohm
Lead Channel Impedance Value: 874 Ohm
Lead Channel Impedance Value: 950 Ohm
Lead Channel Pacing Threshold Amplitude: 0.5 V
Lead Channel Pacing Threshold Amplitude: 0.5 V
Lead Channel Pacing Threshold Pulse Width: 0.4 ms
Lead Channel Pacing Threshold Pulse Width: 0.4 ms
Lead Channel Sensing Intrinsic Amplitude: 18.5 mV
Lead Channel Setting Pacing Amplitude: 2 V
Lead Channel Setting Pacing Amplitude: 2 V
Lead Channel Setting Pacing Amplitude: 2.5 V
Lead Channel Setting Pacing Pulse Width: 0.4 ms
Lead Channel Setting Pacing Pulse Width: 0.4 ms
Lead Channel Setting Pacing Pulse Width: 0.4 ms
Lead Channel Setting Sensing Sensitivity: 2 mV
MDC IDC LEAD IMPLANT DT: 20171023
MDC IDC LEAD IMPLANT DT: 20171023
MDC IDC LEAD LOCATION: 753858
MDC IDC LEAD LOCATION: 753859
MDC IDC LEAD LOCATION: 753860
MDC IDC LEAD LOCATION: 753860
MDC IDC MSMT LEADCHNL LV IMPEDANCE VALUE: 342 Ohm
MDC IDC MSMT LEADCHNL LV IMPEDANCE VALUE: 342 Ohm
MDC IDC MSMT LEADCHNL LV IMPEDANCE VALUE: 342 Ohm
MDC IDC MSMT LEADCHNL LV IMPEDANCE VALUE: 380 Ohm
MDC IDC MSMT LEADCHNL LV IMPEDANCE VALUE: 380 Ohm
MDC IDC MSMT LEADCHNL LV IMPEDANCE VALUE: 380 Ohm
MDC IDC MSMT LEADCHNL LV IMPEDANCE VALUE: 399 Ohm
MDC IDC MSMT LEADCHNL LV IMPEDANCE VALUE: 513 Ohm
MDC IDC MSMT LEADCHNL LV IMPEDANCE VALUE: 513 Ohm
MDC IDC MSMT LEADCHNL LV IMPEDANCE VALUE: 513 Ohm
MDC IDC MSMT LEADCHNL LV IMPEDANCE VALUE: 513 Ohm
MDC IDC MSMT LEADCHNL LV IMPEDANCE VALUE: 570 Ohm
MDC IDC MSMT LEADCHNL LV PACING THRESHOLD AMPLITUDE: 1.25 V
MDC IDC MSMT LEADCHNL LV PACING THRESHOLD AMPLITUDE: 1.375 V
MDC IDC MSMT LEADCHNL LV PACING THRESHOLD PULSEWIDTH: 0.4 ms
MDC IDC MSMT LEADCHNL RA IMPEDANCE VALUE: 304 Ohm
MDC IDC MSMT LEADCHNL RA IMPEDANCE VALUE: 304 Ohm
MDC IDC MSMT LEADCHNL RA PACING THRESHOLD AMPLITUDE: 0.5 V
MDC IDC MSMT LEADCHNL RA PACING THRESHOLD PULSEWIDTH: 0.4 ms
MDC IDC MSMT LEADCHNL RA SENSING INTR AMPL: 1.75 mV
MDC IDC MSMT LEADCHNL RA SENSING INTR AMPL: 1.75 mV
MDC IDC MSMT LEADCHNL RA SENSING INTR AMPL: 1.75 mV
MDC IDC MSMT LEADCHNL RA SENSING INTR AMPL: 1.75 mV
MDC IDC MSMT LEADCHNL RV PACING THRESHOLD AMPLITUDE: 0.5 V
MDC IDC MSMT LEADCHNL RV PACING THRESHOLD PULSEWIDTH: 0.4 ms
MDC IDC MSMT LEADCHNL RV PACING THRESHOLD PULSEWIDTH: 0.4 ms
MDC IDC MSMT LEADCHNL RV SENSING INTR AMPL: 15 mV
MDC IDC MSMT LEADCHNL RV SENSING INTR AMPL: 15 mV
MDC IDC MSMT LEADCHNL RV SENSING INTR AMPL: 18.5 mV
MDC IDC SET LEADCHNL LV PACING AMPLITUDE: 1.75 V
MDC IDC SET LEADCHNL LV PACING AMPLITUDE: 2 V
MDC IDC SET LEADCHNL RV PACING AMPLITUDE: 2.5 V
MDC IDC SET LEADCHNL RV PACING PULSEWIDTH: 0.4 ms
MDC IDC SET LEADCHNL RV SENSING SENSITIVITY: 2 mV
MDC IDC STAT BRADY AP VP PERCENT: 98.3 %
MDC IDC STAT BRADY AP VP PERCENT: 98.3 %
MDC IDC STAT BRADY AP VS PERCENT: 1.67 %
MDC IDC STAT BRADY AP VS PERCENT: 1.67 %
MDC IDC STAT BRADY AS VP PERCENT: 0.02 %
MDC IDC STAT BRADY AS VP PERCENT: 0.02 %
MDC IDC STAT BRADY AS VS PERCENT: 0.01 %
MDC IDC STAT BRADY RV PERCENT PACED: 1.35 %

## 2017-07-30 ENCOUNTER — Encounter: Payer: Self-pay | Admitting: Cardiology

## 2017-08-05 ENCOUNTER — Encounter: Payer: 59 | Admitting: *Deleted

## 2017-08-05 ENCOUNTER — Telehealth: Payer: Self-pay | Admitting: Cardiology

## 2017-08-05 NOTE — Telephone Encounter (Signed)
Spoke with pt and reminded pt of remote transmission that is due today. Pt verbalized understanding.   

## 2017-08-26 ENCOUNTER — Encounter: Payer: Self-pay | Admitting: Family Medicine

## 2017-10-07 ENCOUNTER — Encounter: Payer: Self-pay | Admitting: Cardiology

## 2017-10-07 ENCOUNTER — Ambulatory Visit (INDEPENDENT_AMBULATORY_CARE_PROVIDER_SITE_OTHER): Payer: Self-pay | Admitting: *Deleted

## 2017-10-07 DIAGNOSIS — I495 Sick sinus syndrome: Secondary | ICD-10-CM

## 2017-10-07 NOTE — Progress Notes (Signed)
Remote pacemaker transmission.   

## 2017-10-11 LAB — CUP PACEART REMOTE DEVICE CHECK
Battery Voltage: 3.01 V
Brady Statistic AP VP Percent: 98.3 %
Brady Statistic RA Percent Paced: 99.98 %
Brady Statistic RV Percent Paced: 5.07 %
Implantable Lead Implant Date: 20171023
Implantable Lead Location: 753859
Implantable Lead Location: 753860
Implantable Lead Model: 4298
Implantable Lead Model: 5076
Implantable Lead Model: 5076
Implantable Pulse Generator Implant Date: 20171023
Lead Channel Impedance Value: 1007 Ohm
Lead Channel Impedance Value: 304 Ohm
Lead Channel Impedance Value: 380 Ohm
Lead Channel Impedance Value: 418 Ohm
Lead Channel Impedance Value: 570 Ohm
Lead Channel Impedance Value: 589 Ohm
Lead Channel Impedance Value: 589 Ohm
Lead Channel Impedance Value: 893 Ohm
Lead Channel Sensing Intrinsic Amplitude: 1.75 mV
Lead Channel Sensing Intrinsic Amplitude: 16.75 mV
Lead Channel Sensing Intrinsic Amplitude: 16.75 mV
Lead Channel Setting Pacing Amplitude: 1.75 V
Lead Channel Setting Pacing Amplitude: 2.5 V
MDC IDC LEAD IMPLANT DT: 20171023
MDC IDC LEAD IMPLANT DT: 20171023
MDC IDC LEAD LOCATION: 753858
MDC IDC MSMT BATTERY REMAINING LONGEVITY: 112 mo
MDC IDC MSMT LEADCHNL LV IMPEDANCE VALUE: 304 Ohm
MDC IDC MSMT LEADCHNL LV IMPEDANCE VALUE: 342 Ohm
MDC IDC MSMT LEADCHNL LV IMPEDANCE VALUE: 342 Ohm
MDC IDC MSMT LEADCHNL LV IMPEDANCE VALUE: 513 Ohm
MDC IDC MSMT LEADCHNL LV IMPEDANCE VALUE: 513 Ohm
MDC IDC MSMT LEADCHNL LV PACING THRESHOLD AMPLITUDE: 1.25 V
MDC IDC MSMT LEADCHNL LV PACING THRESHOLD PULSEWIDTH: 0.4 ms
MDC IDC MSMT LEADCHNL RA IMPEDANCE VALUE: 399 Ohm
MDC IDC MSMT LEADCHNL RA PACING THRESHOLD AMPLITUDE: 0.5 V
MDC IDC MSMT LEADCHNL RA PACING THRESHOLD PULSEWIDTH: 0.4 ms
MDC IDC MSMT LEADCHNL RA SENSING INTR AMPL: 1.75 mV
MDC IDC MSMT LEADCHNL RV PACING THRESHOLD AMPLITUDE: 0.5 V
MDC IDC MSMT LEADCHNL RV PACING THRESHOLD PULSEWIDTH: 0.4 ms
MDC IDC SESS DTM: 20190221041420
MDC IDC SET LEADCHNL LV PACING PULSEWIDTH: 0.4 ms
MDC IDC SET LEADCHNL RA PACING AMPLITUDE: 2 V
MDC IDC SET LEADCHNL RV PACING PULSEWIDTH: 0.4 ms
MDC IDC SET LEADCHNL RV SENSING SENSITIVITY: 2 mV
MDC IDC STAT BRADY AP VS PERCENT: 1.68 %
MDC IDC STAT BRADY AS VP PERCENT: 0.01 %
MDC IDC STAT BRADY AS VS PERCENT: 0.01 %

## 2017-10-20 IMAGING — CR DG CHEST 1V PORT
1 series · 1 of 1 positions shown · non-contrast
Comparison: 05/26/2016

CLINICAL DATA: Postop.

EXAM:
PORTABLE CHEST 1 VIEW

[AP]
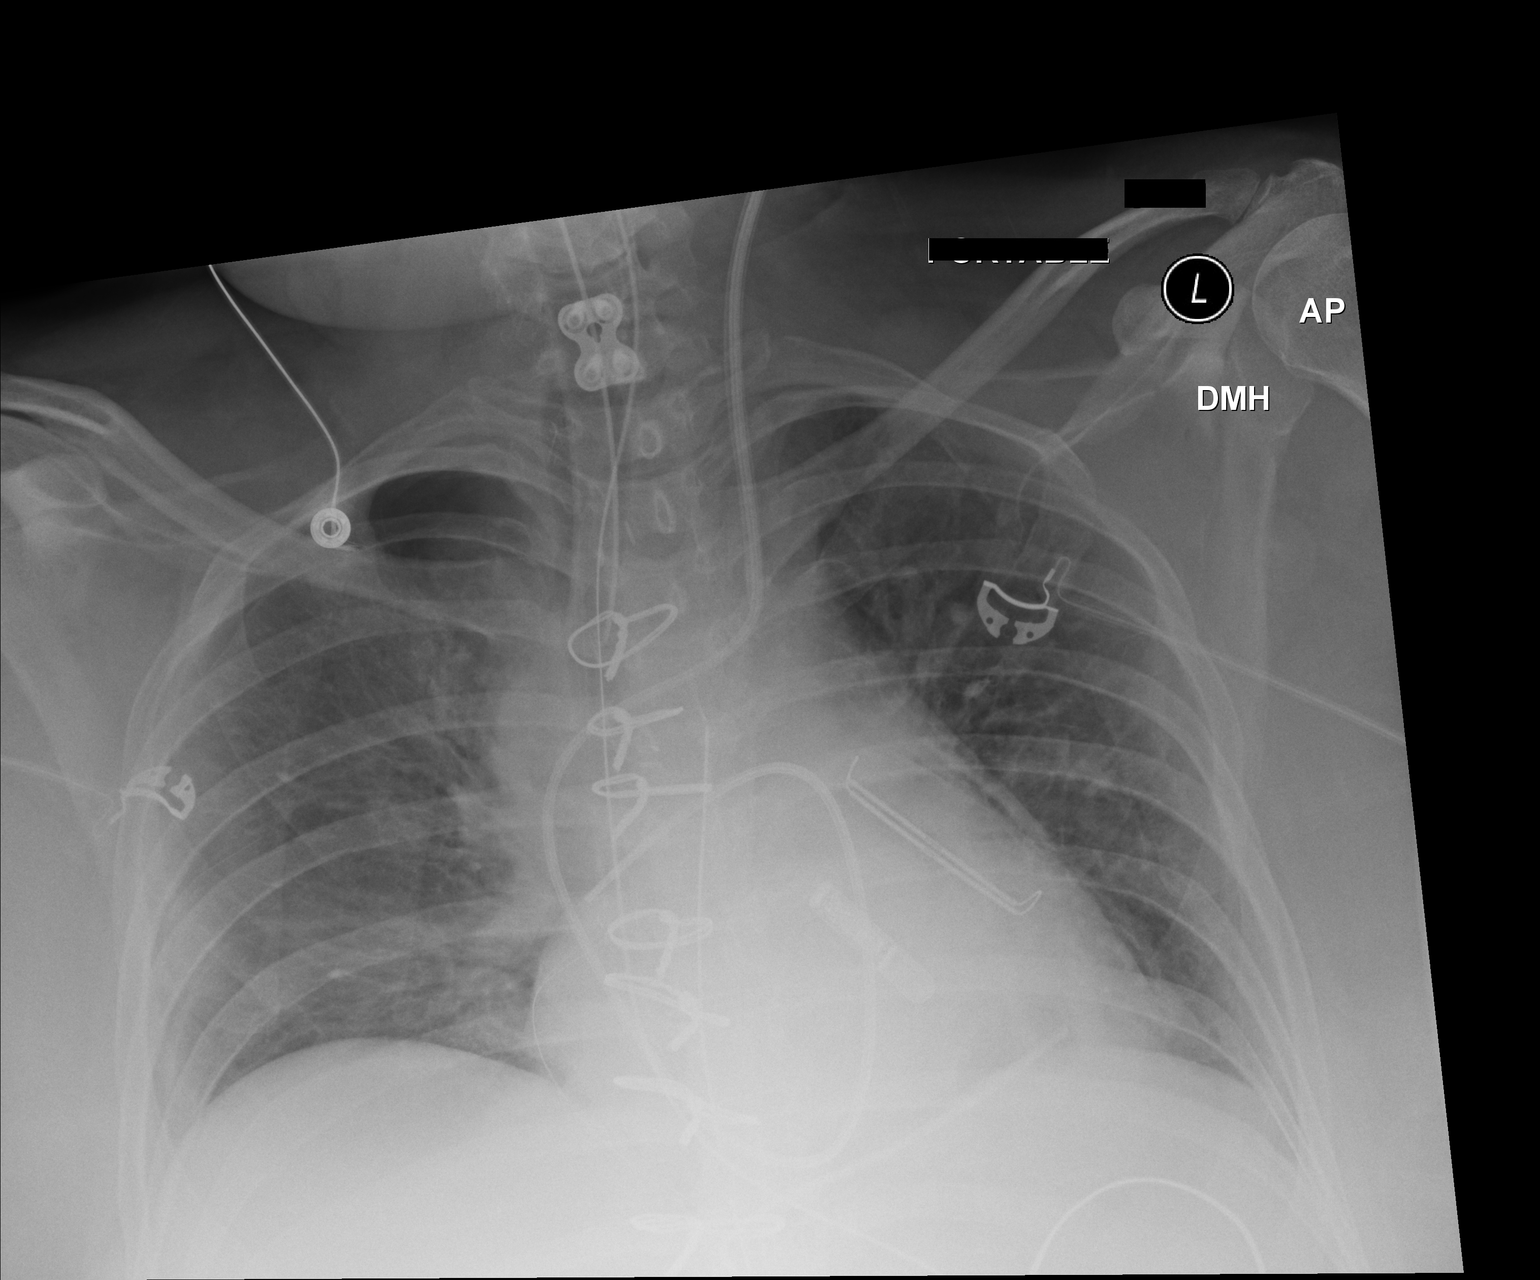

[1 of 1 positions shown; findings below may reference images not displayed]

FINDINGS: Endotracheal tube is at the level of the clavicular heads.
Nasogastric tube extends into the abdomen. Swan-Ganz catheter in the
distal right pulmonary artery region. Left basilar chest tube and
mediastinal chest tube are present. Evidence for a left atrial
appendage clip. Low lung volumes with a small right pneumothorax
measuring roughly 10-15%. Perihilar densities are suggestive for
volume loss. Again noted is a surgical plate in the lower cervical
spine.
IMPRESSION: Right pneumothorax measuring 10-15%.

Support apparatuses as described.

These results were called by telephone at the time of interpretation
on 05/28/2016 at [DATE] to the patient's nurse, Rtoyota Joshjax, who
verbally acknowledged these results.

## 2017-10-20 IMAGING — CR DG CHEST 1V PORT
1 series · 1 of 1 positions shown · non-contrast
Comparison: Earlier same day

CLINICAL DATA: Chest tube insertion on the right for pneumo thorax.

EXAM:
PORTABLE CHEST 1 VIEW

[AP]
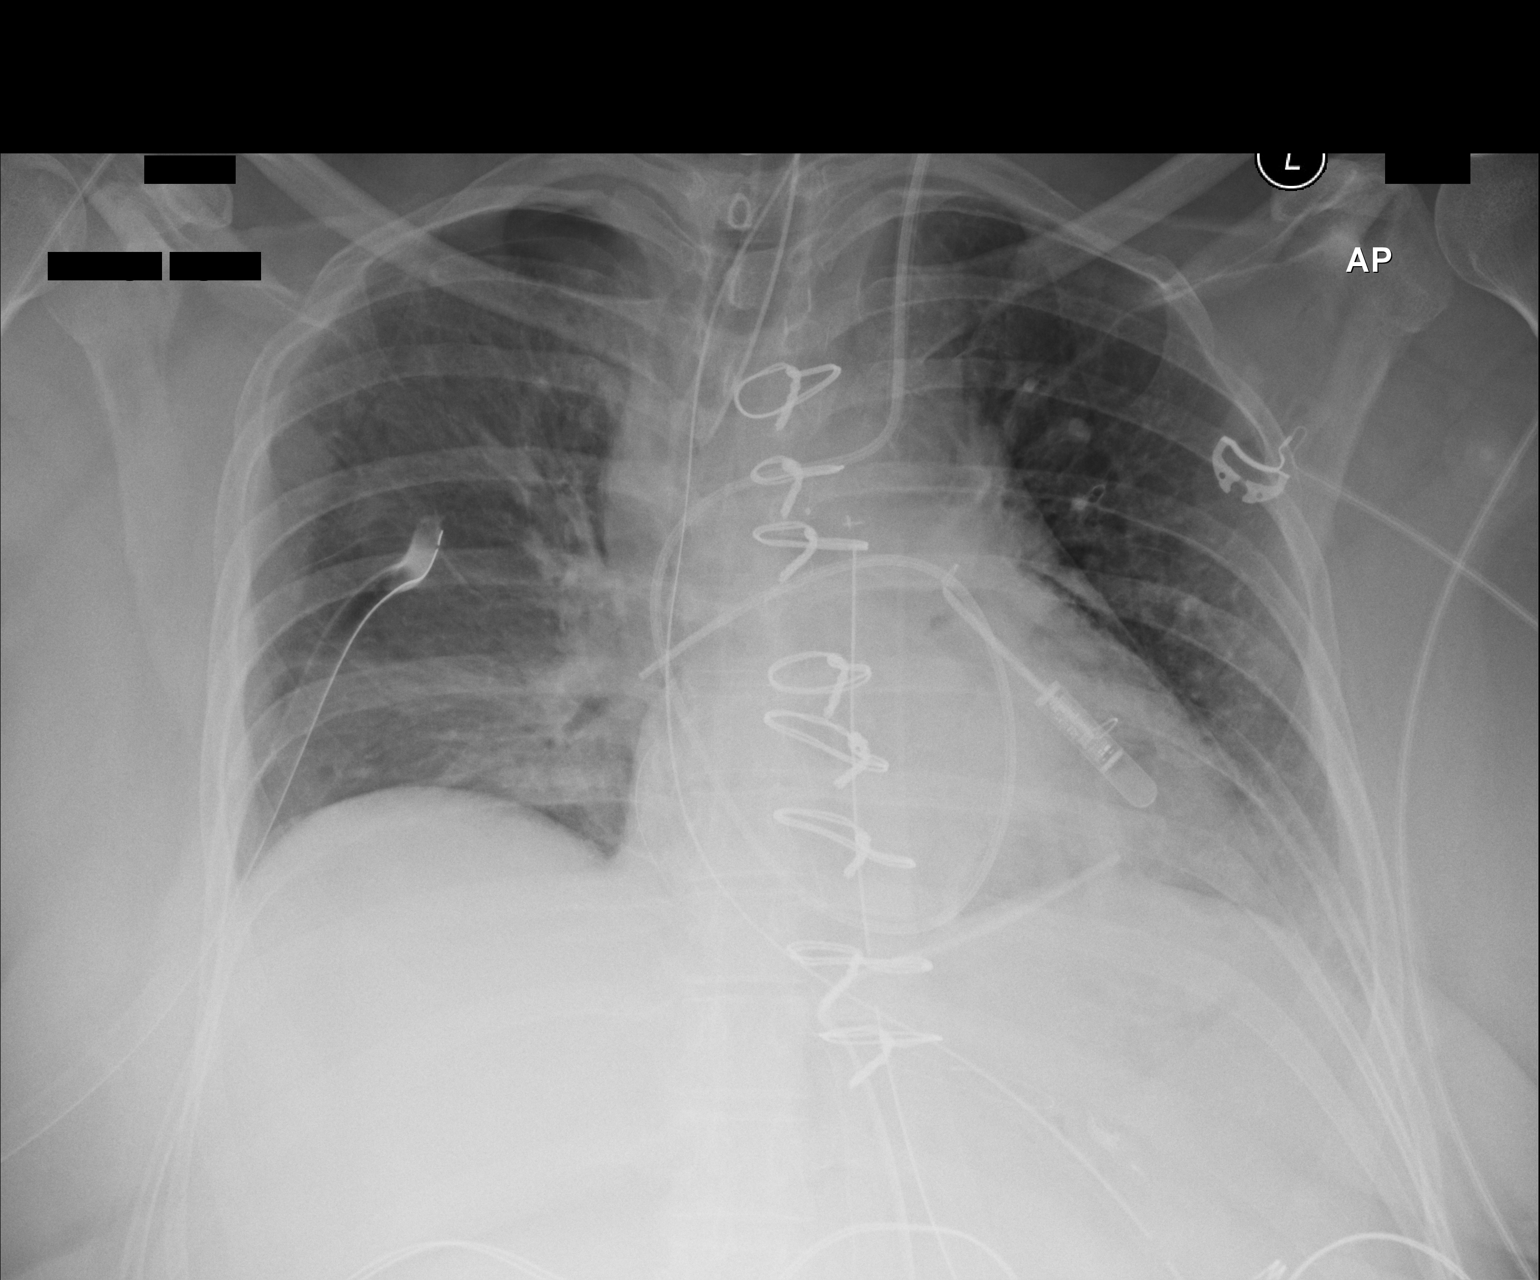

[1 of 1 positions shown; findings below may reference images not displayed]

FINDINGS: Right chest tube is in place, possibly within the major fissure.
Right-sided pneumothorax is smaller but still visible of the apex,
5%. Endotracheal tube has its tip 1 cm above the carina. Nasogastric
tube enters the stomach. Swan-Ganz catheter has its tip in the right
main pulmonary artery. Mild basilar volume loss as seen previously.
IMPRESSION: Right chest tube placed, possibly within the major fissure. Right
pneumothorax is smaller, but with small residual pneumothorax at the
apex, 5%.

## 2017-10-21 IMAGING — CR DG CHEST 1V PORT
1 series · 1 of 1 positions shown · non-contrast
Comparison: 05/28/2016

CLINICAL DATA: Follow-up cardiac surgery.

EXAM:
PORTABLE CHEST 1 VIEW

[AP]
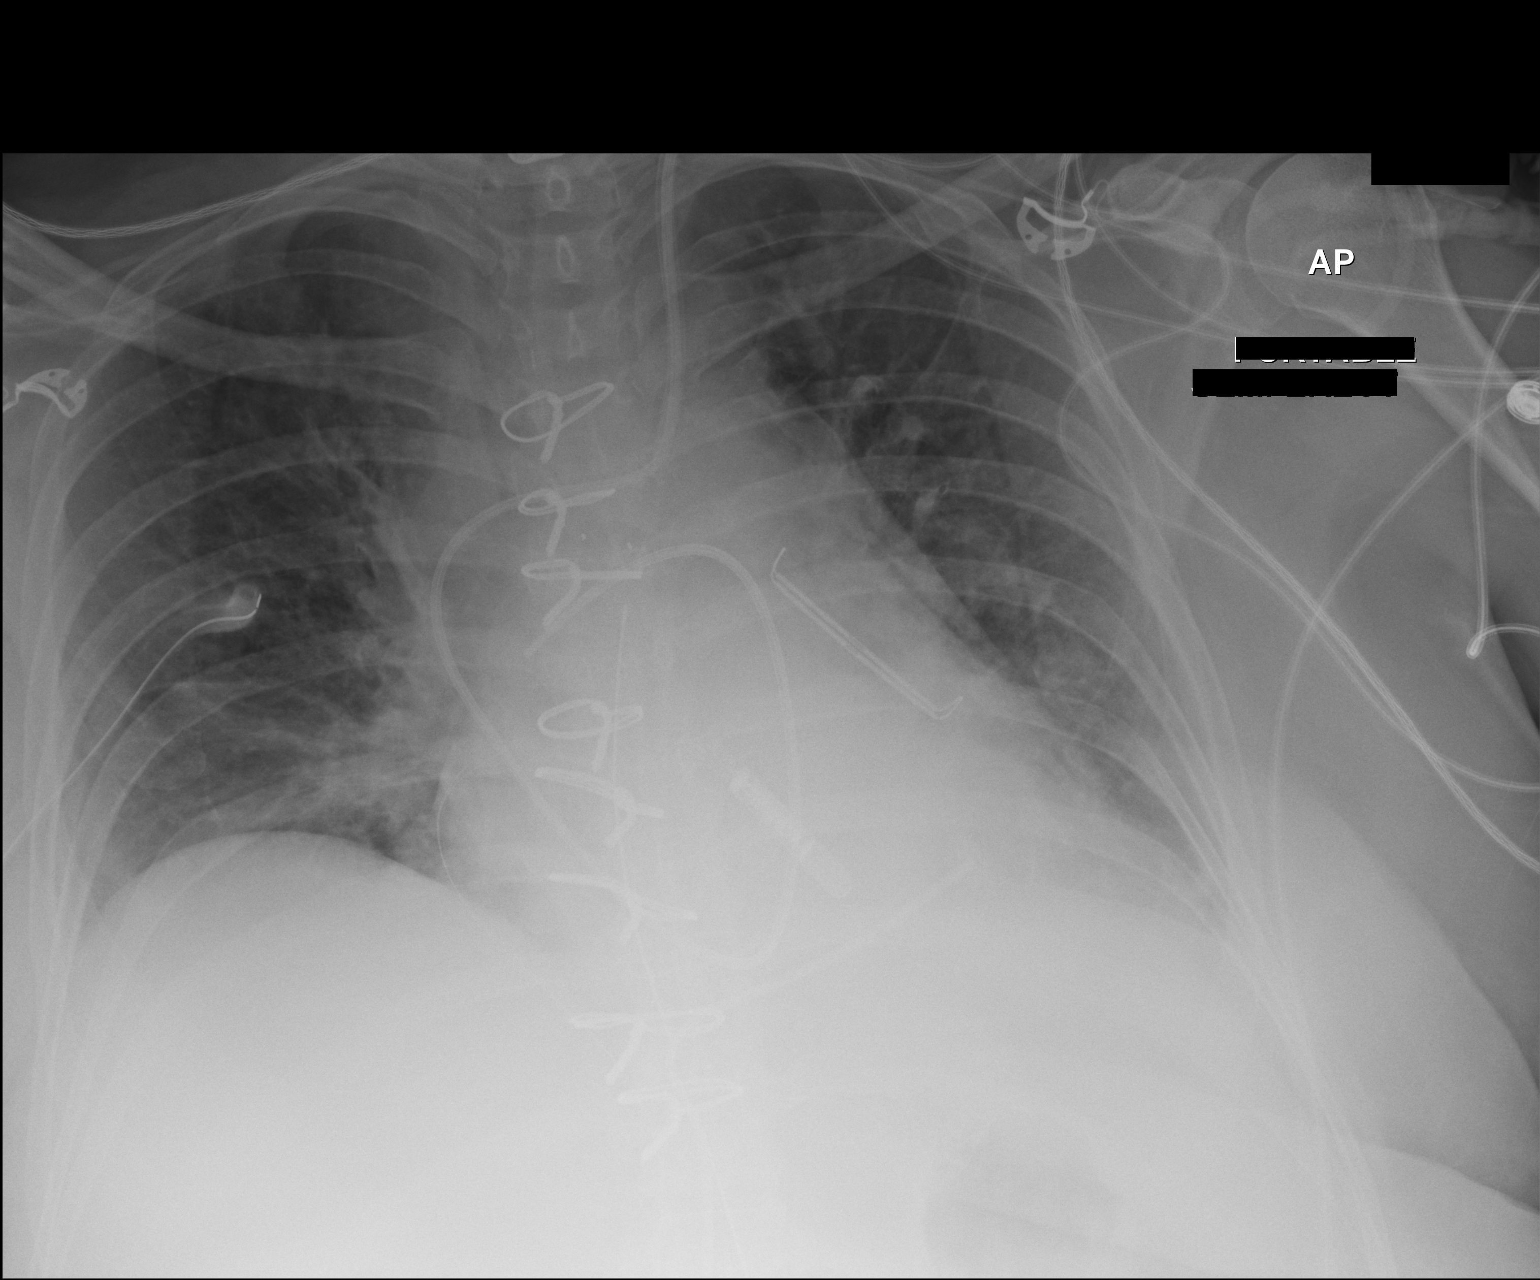

[1 of 1 positions shown; findings below may reference images not displayed]

FINDINGS: The Swan-Ganz catheter with the tip projecting over the right main
pulmonary artery.

Interval removal of the endotracheal tube.  Right-sided chest tube.

Mediastinal drain in unchanged position.

Bilateral interstitial prominence. No pleural effusion or
pneumothorax. Stable cardiomegaly. No acute osseous abnormality.
IMPRESSION: 1. Cardiomegaly with mild pulmonary vascular congestion.
2. Swan-Ganz catheter with the tip projecting over the right main
pulmonary artery.

## 2017-10-22 IMAGING — DX DG CHEST 1V PORT
1 series · 1 of 1 positions shown · non-contrast
Comparison: 05/29/2016 and CT chest 04/06/2016.

CLINICAL DATA: Atelectasis.

EXAM:
PORTABLE CHEST 1 VIEW

[chest ap]
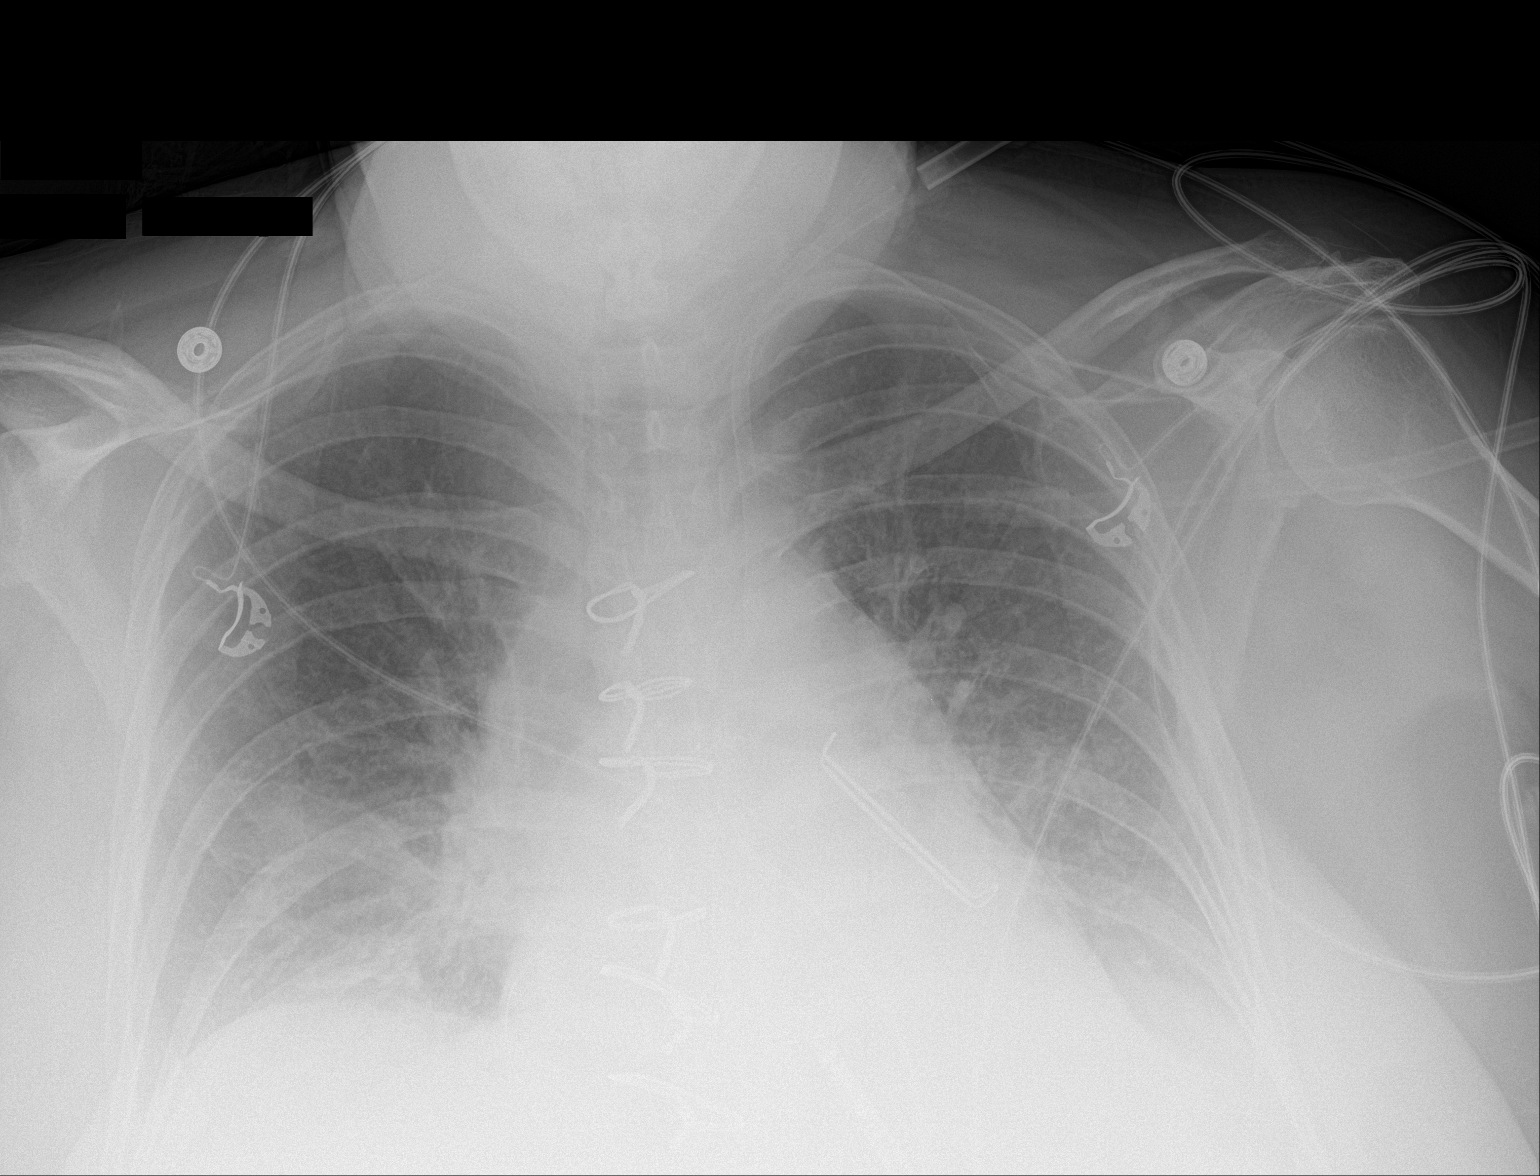

[1 of 1 positions shown; findings below may reference images not displayed]

FINDINGS: Trachea is midline. Left IJ catheter sheath remains in place with
removal of the Swan-Ganz catheter. Mediastinal drain and chest tubes
have been removed. Mild basilar dependent interstitial prominence
and indistinctness with bibasilar volume loss and left lower lobe
collapse/ consolidation. No definite pneumothorax. Small left
pleural effusion.
IMPRESSION: 1. Pulmonary edema and small left pleural effusion.
2. Left lower lobe collapse/consolidation.
3. Bibasilar atelectasis.
4. No definite pneumothorax after chest tube removal.

## 2017-10-24 IMAGING — CR DG CHEST 1V PORT
1 series · 1 of 1 positions shown · non-contrast
Comparison: 05/31/2016

CLINICAL DATA: Shortness of breath. Postop from ASD repair and Maze
procedure. Atrial fibrillation and atrial septal defect.

EXAM:
PORTABLE CHEST 1 VIEW

[AP]
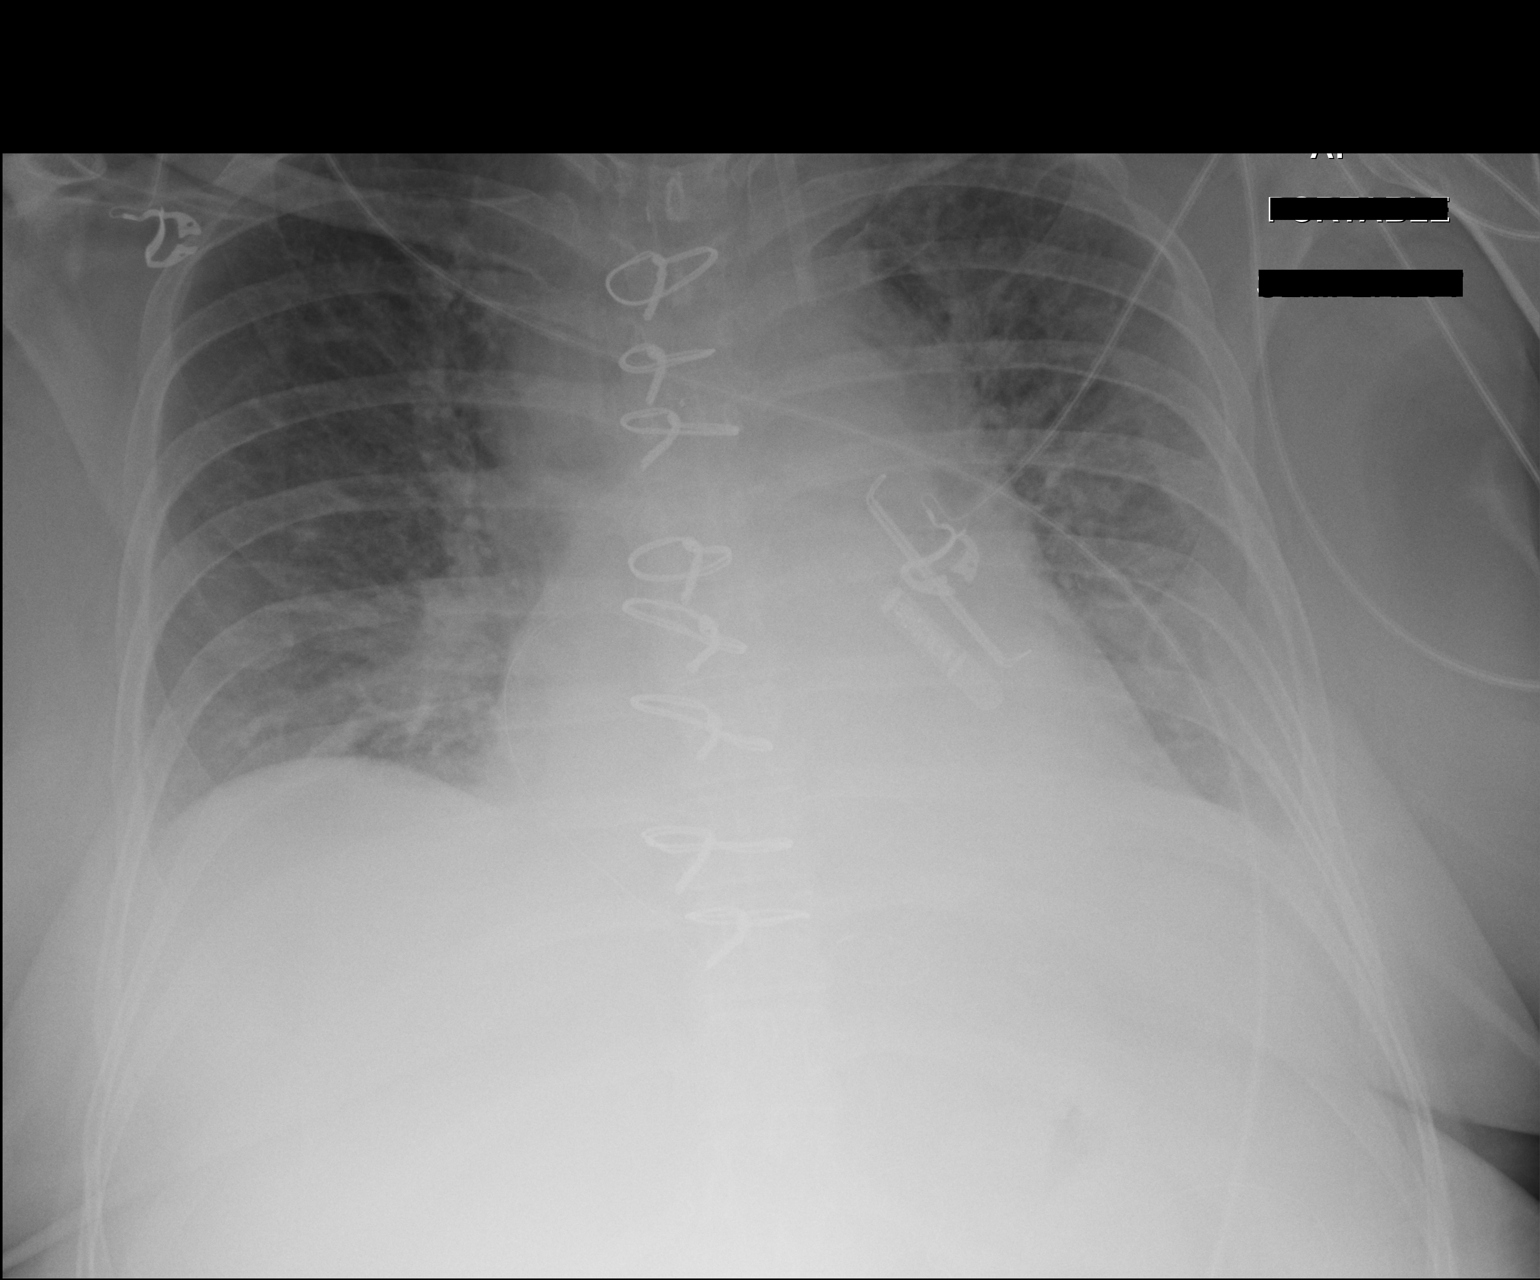

[1 of 1 positions shown; findings below may reference images not displayed]

FINDINGS: Left jugular Cordis tip overlies the left innominate vein, which is
unchanged in position. Previous median sternotomy noted with atrial
appendage clip.

Mild cardiomegaly stable. Mild diffuse interstitial infiltrates are
consistent with mild interstitial edema. No evidence focal
consolidation or pneumothorax.
IMPRESSION: Stable postop chest with mild diffuse interstitial edema. No
evidence of pneumothorax.

## 2017-10-27 ENCOUNTER — Other Ambulatory Visit: Payer: Self-pay | Admitting: Family Medicine

## 2018-01-06 ENCOUNTER — Ambulatory Visit (INDEPENDENT_AMBULATORY_CARE_PROVIDER_SITE_OTHER): Payer: 59 | Admitting: *Deleted

## 2018-01-06 DIAGNOSIS — I495 Sick sinus syndrome: Secondary | ICD-10-CM | POA: Diagnosis not present

## 2018-01-06 NOTE — Progress Notes (Signed)
Remote pacemaker transmission.   

## 2018-01-17 ENCOUNTER — Other Ambulatory Visit: Payer: Self-pay | Admitting: Internal Medicine

## 2018-01-17 NOTE — Telephone Encounter (Signed)
Xarelto 20mg  refill request received; pt is 57 yrs old, wt-119.8kg, Crea-0.84 on 06/06/17, last seen by Chanetta Marshall on 07/05/17, CrCl-144.8 ml/min; will send in refill to requested Pharmacy.

## 2018-02-01 LAB — CUP PACEART REMOTE DEVICE CHECK
Battery Remaining Longevity: 109 mo
Brady Statistic AP VP Percent: 98.28 %
Brady Statistic AP VS Percent: 1.67 %
Brady Statistic AS VS Percent: 0.01 %
Brady Statistic RV Percent Paced: 6.54 %
Date Time Interrogation Session: 20190523043026
Implantable Lead Implant Date: 20171023
Implantable Lead Implant Date: 20171023
Implantable Lead Implant Date: 20171023
Implantable Lead Location: 753858
Implantable Lead Location: 753859
Implantable Lead Location: 753860
Implantable Lead Model: 5076
Implantable Lead Model: 5076
Lead Channel Impedance Value: 304 Ohm
Lead Channel Impedance Value: 323 Ohm
Lead Channel Impedance Value: 342 Ohm
Lead Channel Impedance Value: 342 Ohm
Lead Channel Impedance Value: 380 Ohm
Lead Channel Impedance Value: 532 Ohm
Lead Channel Impedance Value: 570 Ohm
Lead Channel Impedance Value: 570 Ohm
Lead Channel Impedance Value: 931 Ohm
Lead Channel Pacing Threshold Amplitude: 0.5 V
Lead Channel Pacing Threshold Amplitude: 1.25 V
Lead Channel Pacing Threshold Pulse Width: 0.4 ms
Lead Channel Pacing Threshold Pulse Width: 0.4 ms
Lead Channel Pacing Threshold Pulse Width: 0.4 ms
Lead Channel Sensing Intrinsic Amplitude: 13.125 mV
Lead Channel Sensing Intrinsic Amplitude: 2.5 mV
Lead Channel Sensing Intrinsic Amplitude: 2.5 mV
Lead Channel Setting Pacing Amplitude: 2 V
Lead Channel Setting Pacing Amplitude: 2.5 V
Lead Channel Setting Pacing Pulse Width: 0.4 ms
Lead Channel Setting Sensing Sensitivity: 2 mV
MDC IDC MSMT BATTERY VOLTAGE: 3 V
MDC IDC MSMT LEADCHNL LV IMPEDANCE VALUE: 399 Ohm
MDC IDC MSMT LEADCHNL LV IMPEDANCE VALUE: 532 Ohm
MDC IDC MSMT LEADCHNL LV IMPEDANCE VALUE: 589 Ohm
MDC IDC MSMT LEADCHNL RA IMPEDANCE VALUE: 418 Ohm
MDC IDC MSMT LEADCHNL RV IMPEDANCE VALUE: 836 Ohm
MDC IDC MSMT LEADCHNL RV PACING THRESHOLD AMPLITUDE: 0.5 V
MDC IDC MSMT LEADCHNL RV SENSING INTR AMPL: 13.125 mV
MDC IDC PG IMPLANT DT: 20171023
MDC IDC SET LEADCHNL LV PACING AMPLITUDE: 1.75 V
MDC IDC SET LEADCHNL LV PACING PULSEWIDTH: 0.4 ms
MDC IDC STAT BRADY AS VP PERCENT: 0.03 %
MDC IDC STAT BRADY RA PERCENT PACED: 99.96 %

## 2018-02-10 ENCOUNTER — Other Ambulatory Visit (HOSPITAL_COMMUNITY): Payer: Self-pay | Admitting: Orthopedic Surgery

## 2018-02-10 ENCOUNTER — Other Ambulatory Visit: Payer: Self-pay | Admitting: Internal Medicine

## 2018-02-10 DIAGNOSIS — Z96652 Presence of left artificial knee joint: Secondary | ICD-10-CM

## 2018-02-25 ENCOUNTER — Telehealth: Payer: Self-pay | Admitting: Nurse Practitioner

## 2018-02-25 MED ORDER — DICYCLOMINE HCL 20 MG PO TABS: 20.0000 mg | ORAL_TABLET | Freq: Two times a day (BID) | ORAL | 0 refills | Status: DC | PRN

## 2018-02-25 MED ORDER — CIPROFLOXACIN HCL 500 MG PO TABS: 500.0000 mg | ORAL_TABLET | Freq: Two times a day (BID) | ORAL | 0 refills | Status: DC

## 2018-02-25 MED ORDER — METRONIDAZOLE 500 MG PO TABS: 500.0000 mg | ORAL_TABLET | Freq: Three times a day (TID) | ORAL | 0 refills | Status: DC

## 2018-02-25 NOTE — Telephone Encounter (Signed)
Spoke with the patient. She is reporting abdominal pain and cramping in the LLQ. Admits to eating popcorn. Pain reminds her of previous diverticulitis flare that required treatment. Per Tye Savoy patient to begin Cipro and Flagyl. PRN Bentyl. See medications.Patient states she has taken Cipro in the past year and did not have any allergic response or adverse events. Patient will call for an appointment if her pain persists.

## 2018-03-29 ENCOUNTER — Other Ambulatory Visit: Payer: Self-pay | Admitting: Family Medicine

## 2018-03-30 ENCOUNTER — Telehealth: Payer: Self-pay | Admitting: Nurse Practitioner

## 2018-03-30 NOTE — Telephone Encounter (Signed)
Patient called Destiny Dennis. Several weeks ago we treated her empirically for recurrent diverticulitis, and she had complete resolution of LLQ pain  A few days ago Destiny Dennis began having intermittent sharp LLQ pain with "heaviness" again.  Doesn't feel constipated. She is having BMs ,some solid, some loose. The pain doesn't get better with defecation, in fact she is having rectal discomfort with BMs. The abdominal pain is not present when she is laying down. She has no fevers / chills. No urinary sx. No vaginal sx. She saw GYN who wondered about ? Kidney stones. U/A apparently showed small amount of blood.    Destiny Dennis has a hx of diverticulitis but I wonder if she may also have underlying IBS given intermittent nature of pain and altered bowel habits. She needs to be evaluated in clinic for recurrent / ongoing sx.   Beth, please make an appt for patient. I am happy to see or can see any of the other APPS as well. She sees Dr. Carlean Purl but I doubt he has any openings in near future. Thanks

## 2018-03-30 NOTE — Telephone Encounter (Signed)
Patient called to confirm appt with Dr.Gessner for Monday 8.19.19

## 2018-03-30 NOTE — Telephone Encounter (Signed)
Patient contacted and scheduled for 04/04/18 with Dr Carlean Purl.

## 2018-04-04 ENCOUNTER — Encounter: Payer: Self-pay | Admitting: Internal Medicine

## 2018-04-04 ENCOUNTER — Ambulatory Visit (INDEPENDENT_AMBULATORY_CARE_PROVIDER_SITE_OTHER): Payer: 59 | Admitting: Internal Medicine

## 2018-04-04 VITALS — BP 132/74 | HR 66 | Ht 71.0 in | Wt 276.4 lb

## 2018-04-04 DIAGNOSIS — R10814 Left lower quadrant abdominal tenderness: Secondary | ICD-10-CM | POA: Diagnosis not present

## 2018-04-04 DIAGNOSIS — R1032 Left lower quadrant pain: Secondary | ICD-10-CM

## 2018-04-04 NOTE — Patient Instructions (Addendum)
Your provider has requested that you go to the basement level for lab work before leaving today. Press "B" on the elevator. The lab is located at the first door on the left as you exit the elevator.   You have been scheduled for a CT scan of the abdomen and pelvis at [facility] ([address]---this is in the same building as Press [facility]).   You are scheduled on __8/26/19___ at _____2:30pm____. You should arrive 15 minutes prior to your appointment time for registration. Please follow the written instructions below on the day of your exam:  WARNING: IF YOU ARE ALLERGIC TO IODINE/X-RAY DYE, PLEASE NOTIFY RADIOLOGY IMMEDIATELY AT [phone]! YOU WILL BE GIVEN A 13 HOUR PREMEDICATION PREP.  1) Do not eat  anything after ______10:30AM____ (4 hours prior to your test), drinking is okay, stay hydrated. 2) You have been given 2 bottles of oral contrast to drink. The solution may taste better if refrigerated, but do NOT add ice or any other liquid to this solution. Shake well before drinking.    Drink 1 bottle of contrast @ ____12:30pm____ (2 hours prior to your exam)  Drink 1 bottle of contrast @ _____1:30pm____ (1 hour prior to your exam)   The purpose of you drinking the oral contrast is to aid in the visualization of your intestinal tract. The contrast solution may cause some diarrhea. Before your exam is started, you will be given a small amount of fluid to drink. Depending on your individual set of symptoms, you may also receive an intravenous injection of x-ray contrast/dye. Plan on being at [facility] for 30 minutes or longer, depending on the type of exam you are having performed.  This test typically takes 30-45 minutes to complete.  If you have any questions regarding your exam or if you need to reschedule, you may call the CT department at [phone] between the hours of 8:00 am and 5:00 pm,  Monday-Friday.  ________________________________________________________________________  I appreciate the opportunity to care for you. Silvano Rusk, MD, [facility]

## 2018-04-04 NOTE — Progress Notes (Signed)
PRERNA HAROLD 57 y.o. 1961/07/21 [id number]  Assessment & Plan:   Encounter Diagnoses  Name Primary?  . Left lower quadrant abdominal tenderness without rebound tenderness Yes  . LLQ/inguinal pain    I am not sure what is going on here wonder if she could have some type of a hernia like a femoral hernia or a subtle inguinal hernia.  Going to do a CT scan.  Further plans pending that.  I appreciate the opportunity to care for this patient. CC: Lucretia Kern, DO   Subjective:   Chief Complaint: Left lower quadrant pain  HPI Leafy Ro is here with complaints of left lower quadrant pain, it comes and goes started in July and is intermittent.  It is worse when she walks.  She will get a spot in the left lower quadrant and groin area that is firm though there is no clear protrusion and it hurts.  When she sits down or lies down is not a problem.  She has not had problems like this before.  She saw her gynecologist who did not think it was related.  Bowel habits are not affected are related either.  It does not seem like previous diverticulitis she has got her son off to college where he is playing baseball Allergies  Allergen Reactions  . Avelox [Moxifloxacin Hcl In Nacl] Other (See Comments)    Numbness & tingling Peripheral neuropathy   Current Meds  Medication Sig  . acetaminophen (TYLENOL) 325 MG tablet Take 1-2 tablets (325-650 mg total) by mouth every 6 (six) hours as needed for mild pain or headache (For pain.).  Marland Kitchen dicyclomine (BENTYL) 20 MG tablet Take 1 tablet (20 mg total) by mouth 2 (two) times daily as needed for spasms.  . metoprolol succinate (TOPROL-XL) 100 MG 24 hr tablet Take 1 tablet in the morning and 1/2 tablet at night.  . Multiple Vitamin (MULTIVITAMIN) capsule Take 1 capsule by mouth daily.  . pantoprazole (PROTONIX) 40 MG tablet TAKE 1 TABLET (40 MG TOTAL) BY MOUTH DAILY AS NEEDED (HEARTBURN).  Alveda Reasons 20 MG TABS tablet TAKE 1 TABLET AT BEDTIME   Past  Medical History:  Diagnosis Date  . Allergic rhinitis   . Anxiety    when tachycardia occurs  . Arthritis    OA, s/p numerous surgeries -back, knees, hip  . Benign fundic gland polyps of stomach   . Cervical disc disease   . Diverticulitis    CT Scan   . DVT (deep venous thrombosis) ([place])    in pregnancy, s/p hip surgery  . Eustachian tube dysfunction   . Fatty liver   . Gallbladder problem   . GERD 12/23/2009   diet related-not a problem  . Hemorrhoids   . Hiatal hernia    small  . History of MRSA infection 2008   superficial skin-cleared easily with doxycycline  . Obesity   . Peripheral vascular disease ([place])    left leg after hip surgery  . Persistent atrial fibrillation ([place]) 03/05/2007   a. s/p RFCA 12/18/2011, 07/29/12  . s/p atrial septal defect repair 05/28/2016  . S/P Maze operation for atrial fibrillation 05/28/2016   Complete bilateral atrial lesion set using cryothermy and bipolar radiofrequency ablation with clipping of LA appendage via median sternotomy  . Sialoadenitis   . Tachy-brady syndrome [facility])    Past Surgical History:  Procedure Laterality Date  . ASD REPAIR N/A 05/28/2016   Procedure: ATRIAL SEPTAL DEFECT (ASD) closure;  Surgeon: Rexene Alberts,  MD;  Location: [facility] OR;  Service: Open Heart Surgery;  Laterality: N/A;  . ATRIAL FIBRILLATION ABLATION N/A 12/18/2011   Procedure: ATRIAL FIBRILLATION ABLATION;  Surgeon: [name], MD;  Location: [facility] CATH LAB;  Service: Cardiovascular;  Laterality: N/A;  . ATRIAL FIBRILLATION ABLATION N/A 07/29/2012   Procedure: ATRIAL FIBRILLATION ABLATION;  Surgeon: [name], MD;  Location: [facility] CATH LAB;  Service: Cardiovascular;  Laterality: N/A;  . CARDIAC CATHETERIZATION N/A 04/10/2016   Procedure: Right/Left Heart Cath and Coronary Angiography;  Surgeon: Jettie Booze, MD;  Location: [place] CV LAB;  Service: Cardiovascular;  Laterality: N/A;  . CERVICAL FUSION    . CESAREAN SECTION     x 2  . CHOLECYSTECTOMY     . CLIPPING OF ATRIAL APPENDAGE  05/28/2016   Procedure: CLIPPING OF ATRIAL APPENDAGE;  Surgeon: Rexene Alberts, MD;  Location: [place];  Service: Open Heart Surgery;;  . COLONOSCOPY  07/16/2011   diverticulosis  . ELBOW SURGERY     Right  . EP IMPLANTABLE DEVICE N/A 09/10/2015   Procedure: Loop Recorder Insertion;  Surgeon: [name], MD;  Location: [facility] CV LAB;  Service: Cardiovascular;  Laterality: N/A;  . EP IMPLANTABLE DEVICE N/A 06/08/2016   MDT Marcelino Scot CRT-P implanted by Dr Curt Bears  . FLEXIBLE SIGMOIDOSCOPY     1990's  . FOOT SURGERY     Right   . I&D KNEE WITH POLY EXCHANGE Left 12/12/2012   Procedure: LEFT KNEE EXCISION SAPHENOUS NEUROMA/OPEN SCAR DEBRIDEMENT/POLY EXCHANGE/NERVE EXCISION;  Surgeon: Mauri Pole, MD;  Location: [facility];  Service: Orthopedics;  Laterality: Left;  . JOINT REPLACEMENT Bilateral    4'14  knees  . KNEE SURGERY     x 9 Left Knee  . MAZE  05/28/2016   Procedure: MAZE;  Surgeon: Rexene Alberts, MD;  Location: [facility];  Service: Open Heart Surgery;;  . MEDIASTERNOTOMY N/A 05/28/2016   Procedure: MEDIAN STERNOTOMY;  Surgeon: Rexene Alberts, MD;  Location: [place];  Service: Open Heart Surgery;  Laterality: N/A;  . REDUCTION MAMMAPLASTY Bilateral   . SHOULDER SURGERY     Right   . TEE WITHOUT CARDIOVERSION  12/17/2011   Procedure: TRANSESOPHAGEAL ECHOCARDIOGRAM (TEE);  Surgeon: Larey Dresser, MD;  Location: [facility] ENDOSCOPY;  Service: Cardiovascular;  Laterality: N/A;  . TEE WITHOUT CARDIOVERSION  07/28/2012   Procedure: TRANSESOPHAGEAL ECHOCARDIOGRAM (TEE);  Surgeon: Thayer Headings, MD;  Location: [place];  Service: Cardiovascular;  Laterality: N/A;  . TEE WITHOUT CARDIOVERSION N/A 01/01/2016   Procedure: TRANSESOPHAGEAL ECHOCARDIOGRAM (TEE);  Surgeon: Sanda Klein, MD;  Location: [facility] ENDOSCOPY;  Service: Cardiovascular;  Laterality: N/A;  . TEE WITHOUT CARDIOVERSION N/A 05/28/2016   Procedure: TRANSESOPHAGEAL ECHOCARDIOGRAM (TEE);   Surgeon: Rexene Alberts, MD;  Location: [place];  Service: Open Heart Surgery;  Laterality: N/A;  . TOTAL HIP ARTHROPLASTY Left 10/17/2013   Procedure: LEFT TOTAL HIP ARTHROPLASTY ANTERIOR APPROACH;  Surgeon: Mauri Pole, MD;  [facility];  Service: Orthopedics;  Laterality: Left;  . TOTAL KNEE ARTHROPLASTY Right 12/12/2012   Procedure: RIGHT TOTAL KNEE ARTHROPLASTY;  Surgeon: Mauri Pole, MD;  [facility];  Service: Orthopedics;  Laterality: Right;  . TUBAL LIGATION    . UPPER GASTROINTESTINAL ENDOSCOPY  01/30/2010   hiatal hernia, fundic gland polyps  . WRIST SURGERY     Left    Social History   Social History Narrative   Updated 06/2015   Daily Caffeine    Pt lives in  [place] with spouse and two children 85 and 39 yo in 2-16.  She works as a workers Dance movement psychotherapist.   Christian   No regular exercise, diet ok   family history includes Diabetes in her maternal grandfather and mother; Diverticulitis in her brother and paternal grandmother; Hyperlipidemia in her mother; Liver disease in her father; Obesity in her father.   Review of Systems As per HPI  Objective:   Physical Exam BP 132/74   Pulse 66   Ht 5' 11"  (1.803 m)   Wt 276 lb 6 oz (125.4 kg)   LMP 04/17/2012   BMI 38.55 kg/m  Eyes anicteric abd - obese soft - nontender supine  When erect focal L groin/LQ tenderness with involuntary guarding midway between midline and pelvic brim

## 2018-04-07 ENCOUNTER — Ambulatory Visit (INDEPENDENT_AMBULATORY_CARE_PROVIDER_SITE_OTHER): Payer: 59 | Admitting: *Deleted

## 2018-04-07 DIAGNOSIS — I481 Persistent atrial fibrillation: Secondary | ICD-10-CM

## 2018-04-07 DIAGNOSIS — I495 Sick sinus syndrome: Secondary | ICD-10-CM

## 2018-04-07 DIAGNOSIS — I4819 Other persistent atrial fibrillation: Secondary | ICD-10-CM

## 2018-04-08 ENCOUNTER — Encounter: Payer: Self-pay | Admitting: Cardiology

## 2018-04-08 NOTE — Progress Notes (Signed)
Remote pacemaker transmission.   

## 2018-04-11 ENCOUNTER — Ambulatory Visit (INDEPENDENT_AMBULATORY_CARE_PROVIDER_SITE_OTHER)
Admission: RE | Admit: 2018-04-11 | Discharge: 2018-04-11 | Disposition: A | Discharge status: Home / Self Care | Payer: 59 | Source: Ambulatory Visit | Attending: Internal Medicine | Admitting: Internal Medicine

## 2018-04-11 DIAGNOSIS — R1032 Left lower quadrant pain: Secondary | ICD-10-CM | POA: Diagnosis not present

## 2018-04-11 DIAGNOSIS — R10814 Left lower quadrant abdominal tenderness: Secondary | ICD-10-CM

## 2018-04-11 MED ORDER — IOPAMIDOL (ISOVUE-300) INJECTION 61%
100.0000 mL | Freq: Once | INTRAVENOUS | Status: AC | PRN
  Administered 2018-04-11: 100 mL via INTRAVENOUS

## 2018-04-13 ENCOUNTER — Other Ambulatory Visit: Payer: Self-pay

## 2018-04-13 MED ORDER — AMOXICILLIN-POT CLAVULANATE 875-125 MG PO TABS: 1.0000 | ORAL_TABLET | Freq: Two times a day (BID) | ORAL | 0 refills | Status: DC

## 2018-04-13 MED ORDER — IBUPROFEN 600 MG PO TABS: 600.0000 mg | ORAL_TABLET | Freq: Four times a day (QID) | ORAL | 0 refills | Status: AC | PRN

## 2018-04-13 NOTE — Progress Notes (Signed)
The CT shows changes of diverticulitis and epiploic appendagitis (inflammation of the fat attached to colon)  Tx  Augmentin 875 mg bid x 10 days no refill for diverticulitis Ibuprofen 600 mg tid x 2 weeks for the epiploic appendagitis  After that ask her to contact me/us with symptom update after Tx

## 2018-04-14 ENCOUNTER — Encounter: Payer: Self-pay | Admitting: Family Medicine

## 2018-05-03 ENCOUNTER — Other Ambulatory Visit: Payer: Self-pay | Admitting: Family Medicine

## 2018-05-12 LAB — CUP PACEART REMOTE DEVICE CHECK
Battery Remaining Longevity: 105 mo
Battery Voltage: 3 V
Brady Statistic AP VP Percent: 98.3 %
Brady Statistic RA Percent Paced: 99.96 %
Brady Statistic RV Percent Paced: 7.61 %
Implantable Lead Implant Date: 20171023
Implantable Lead Location: 753858
Implantable Lead Location: 753860
Implantable Lead Model: 4298
Implantable Lead Model: 5076
Implantable Lead Model: 5076
Lead Channel Impedance Value: 285 Ohm
Lead Channel Impedance Value: 285 Ohm
Lead Channel Impedance Value: 342 Ohm
Lead Channel Impedance Value: 380 Ohm
Lead Channel Impedance Value: 551 Ohm
Lead Channel Impedance Value: 551 Ohm
Lead Channel Impedance Value: 551 Ohm
Lead Channel Impedance Value: 817 Ohm
Lead Channel Impedance Value: 893 Ohm
Lead Channel Pacing Threshold Pulse Width: 0.4 ms
Lead Channel Sensing Intrinsic Amplitude: 13.125 mV
Lead Channel Sensing Intrinsic Amplitude: 13.125 mV
Lead Channel Sensing Intrinsic Amplitude: 2.75 mV
Lead Channel Setting Pacing Amplitude: 1.75 V
Lead Channel Setting Pacing Amplitude: 2.5 V
Lead Channel Setting Pacing Pulse Width: 0.4 ms
Lead Channel Setting Pacing Pulse Width: 0.4 ms
MDC IDC LEAD IMPLANT DT: 20171023
MDC IDC LEAD IMPLANT DT: 20171023
MDC IDC LEAD LOCATION: 753859
MDC IDC MSMT LEADCHNL LV IMPEDANCE VALUE: 323 Ohm
MDC IDC MSMT LEADCHNL LV IMPEDANCE VALUE: 323 Ohm
MDC IDC MSMT LEADCHNL LV IMPEDANCE VALUE: 513 Ohm
MDC IDC MSMT LEADCHNL LV IMPEDANCE VALUE: 513 Ohm
MDC IDC MSMT LEADCHNL LV PACING THRESHOLD AMPLITUDE: 1.25 V
MDC IDC MSMT LEADCHNL LV PACING THRESHOLD PULSEWIDTH: 0.4 ms
MDC IDC MSMT LEADCHNL RA IMPEDANCE VALUE: 399 Ohm
MDC IDC MSMT LEADCHNL RA PACING THRESHOLD AMPLITUDE: 0.5 V
MDC IDC MSMT LEADCHNL RA SENSING INTR AMPL: 2.75 mV
MDC IDC MSMT LEADCHNL RV PACING THRESHOLD AMPLITUDE: 0.5 V
MDC IDC MSMT LEADCHNL RV PACING THRESHOLD PULSEWIDTH: 0.4 ms
MDC IDC PG IMPLANT DT: 20171023
MDC IDC SESS DTM: 20190822003152
MDC IDC SET LEADCHNL RA PACING AMPLITUDE: 2 V
MDC IDC SET LEADCHNL RV SENSING SENSITIVITY: 2 mV
MDC IDC STAT BRADY AP VS PERCENT: 1.66 %
MDC IDC STAT BRADY AS VP PERCENT: 0.02 %
MDC IDC STAT BRADY AS VS PERCENT: 0.02 %

## 2018-06-25 ENCOUNTER — Other Ambulatory Visit: Payer: Self-pay | Admitting: Family Medicine

## 2018-07-07 ENCOUNTER — Telehealth: Payer: Self-pay

## 2018-07-07 ENCOUNTER — Ambulatory Visit (INDEPENDENT_AMBULATORY_CARE_PROVIDER_SITE_OTHER): Payer: 59

## 2018-07-07 DIAGNOSIS — I4819 Other persistent atrial fibrillation: Secondary | ICD-10-CM

## 2018-07-07 DIAGNOSIS — Z6835 Body mass index (BMI) 35.0-35.9, adult: Secondary | ICD-10-CM | POA: Diagnosis not present

## 2018-07-07 DIAGNOSIS — E6609 Other obesity due to excess calories: Secondary | ICD-10-CM | POA: Diagnosis not present

## 2018-07-07 DIAGNOSIS — I495 Sick sinus syndrome: Secondary | ICD-10-CM

## 2018-07-07 NOTE — Telephone Encounter (Signed)
Spoke with pt and reminded pt of remote transmission that is due today. Pt verbalized understanding.   

## 2018-07-08 ENCOUNTER — Encounter: Payer: Self-pay | Admitting: Cardiology

## 2018-07-08 NOTE — Progress Notes (Signed)
Remote pacemaker transmission.   

## 2018-07-29 ENCOUNTER — Other Ambulatory Visit: Payer: Self-pay | Admitting: Obstetrics and Gynecology

## 2018-07-29 DIAGNOSIS — Z1231 Encounter for screening mammogram for malignant neoplasm of breast: Secondary | ICD-10-CM

## 2018-08-05 ENCOUNTER — Other Ambulatory Visit: Payer: Self-pay | Admitting: Internal Medicine

## 2018-08-05 NOTE — Telephone Encounter (Signed)
Pt last saw [facility] 07/05/17, overdue for follow-up.  Pt's last labs were 05/18/17 Creat 0.84, overdue for labwork.  Age 57, weight 125.4kg.  Attempted to contact pt, LMOM needs follow-up appt and labwork scheduled.

## 2018-08-08 ENCOUNTER — Ambulatory Visit
Admission: RE | Admit: 2018-08-08 | Discharge: 2018-08-08 | Disposition: A | Discharge status: Home / Self Care | Payer: 59 | Source: Ambulatory Visit

## 2018-08-08 DIAGNOSIS — Z1231 Encounter for screening mammogram for malignant neoplasm of breast: Secondary | ICD-10-CM

## 2018-08-15 NOTE — Telephone Encounter (Signed)
Past due appt made with [facility] 09-26-17   *STAT* If patient is at the pharmacy, call can be transferred to refill team.   1. Which medications need to be refilled? (please list name of each medication and dose if known) Xarelto 20 mg  2. Which pharmacy/location (including street and city if local pharmacy) is medication to be sent to?[facility]  3. Do they need a 30 day or 90 day supply? Until past due appt 09-26-17

## 2018-08-15 NOTE — Telephone Encounter (Signed)
Pt has an appt scheduled for 09/26/18; will send in refill to requested pharmacy until appt and place a note to obtain labs as pt labs are overdue; last done in 06/06/17. Age-57 yrs old, Crea-0.84 on 06/06/17, Wt-125.4kg, CrCl-149.66ml/min, last seen by Chanetta Marshall on 07/05/17 and was due in a year per her recommendations in OV note.

## 2018-09-01 ENCOUNTER — Ambulatory Visit (INDEPENDENT_AMBULATORY_CARE_PROVIDER_SITE_OTHER): Payer: 59 | Admitting: Family Medicine

## 2018-09-01 ENCOUNTER — Encounter: Payer: Self-pay | Admitting: Family Medicine

## 2018-09-01 VITALS — BP 120/78 | HR 68 | Temp 98.2°F | Ht 71.0 in | Wt 279.0 lb

## 2018-09-01 DIAGNOSIS — S0340XA Sprain of jaw, unspecified side, initial encounter: Secondary | ICD-10-CM

## 2018-09-01 DIAGNOSIS — H6981 Other specified disorders of Eustachian tube, right ear: Secondary | ICD-10-CM | POA: Diagnosis not present

## 2018-09-01 MED ORDER — TIZANIDINE HCL 2 MG PO TABS: 2.0000 mg | ORAL_TABLET | Freq: Two times a day (BID) | ORAL | 0 refills | Status: DC | PRN

## 2018-09-01 NOTE — Progress Notes (Signed)
HPI:  Using dictation device. Unfortunately this device frequently misinterprets words/phrases.   Acute visit for R ear pain: -started: fo 1 week -symptoms:nasal congestion (chronic), R ear/TMJ pain -denies:fever, SOB, NVD, tooth pain, body aches -has tried: nothing -sick contacts/travel/risks: no reported flu, strep or tick exposure -Hx of: TMJ - reports has seen ent many times for this and was told TMJ even when she thought was ear infection. Feels massage and muscle relaxer help best. Wanted to make sure not ear infection. No locking or catching.  ROS: See pertinent positives and negatives per HPI.  Past Medical History:  Diagnosis Date  . Allergic rhinitis   . Anxiety    when tachycardia occurs  . Arthritis    OA, s/p numerous surgeries -back, knees, hip  . Benign fundic gland polyps of stomach   . Cervical disc disease   . Diverticulitis    CT Scan   . DVT (deep venous thrombosis) ([place])    in pregnancy, s/p hip surgery  . Eustachian tube dysfunction   . Fatty liver   . Gallbladder problem   . GERD 12/23/2009   diet related-not a problem  . Hemorrhoids   . Hiatal hernia    small  . History of MRSA infection 2008   superficial skin-cleared easily with doxycycline  . Obesity   . Peripheral vascular disease ([place])    left leg after hip surgery  . Persistent atrial fibrillation 03/05/2007   a. s/p RFCA 12/18/2011, 07/29/12  . s/p atrial septal defect repair 05/28/2016  . S/P Maze operation for atrial fibrillation 05/28/2016   Complete bilateral atrial lesion set using cryothermy and bipolar radiofrequency ablation with clipping of LA appendage via median sternotomy  . Sialoadenitis   . Tachy-brady syndrome [facility])     Past Surgical History:  Procedure Laterality Date  . ASD REPAIR N/A 05/28/2016   Procedure: ATRIAL SEPTAL DEFECT (ASD) closure;  Surgeon: Rexene Alberts, MD;  Location: [place];  Service: Open Heart Surgery;  Laterality: N/A;  . ATRIAL FIBRILLATION  ABLATION N/A 12/18/2011   Procedure: ATRIAL FIBRILLATION ABLATION;  Surgeon: [name], MD;  Location: [facility] CATH LAB;  Service: Cardiovascular;  Laterality: N/A;  . ATRIAL FIBRILLATION ABLATION N/A 07/29/2012   Procedure: ATRIAL FIBRILLATION ABLATION;  Surgeon: [name], MD;  Location: [facility] CATH LAB;  Service: Cardiovascular;  Laterality: N/A;  . CARDIAC CATHETERIZATION N/A 04/10/2016   Procedure: Right/Left Heart Cath and Coronary Angiography;  Surgeon: Jettie Booze, MD;  Location: [place] CV LAB;  Service: Cardiovascular;  Laterality: N/A;  . CERVICAL FUSION    . CESAREAN SECTION     x 2  . CHOLECYSTECTOMY    . CLIPPING OF ATRIAL APPENDAGE  05/28/2016   Procedure: CLIPPING OF ATRIAL APPENDAGE;  Surgeon: Rexene Alberts, MD;  Location: [place];  Service: Open Heart Surgery;;  . COLONOSCOPY  07/16/2011   diverticulosis  . ELBOW SURGERY     Right  . EP IMPLANTABLE DEVICE N/A 09/10/2015   Procedure: Loop Recorder Insertion;  Surgeon: [name], MD;  Location: [place] CV LAB;  Service: Cardiovascular;  Laterality: N/A;  . EP IMPLANTABLE DEVICE N/A 06/08/2016   MDT Marcelino Scot CRT-P implanted by Dr Curt Bears  . FLEXIBLE SIGMOIDOSCOPY     1990's  . FOOT SURGERY     Right   . I&D KNEE WITH POLY EXCHANGE Left 12/12/2012   Procedure: LEFT KNEE EXCISION SAPHENOUS NEUROMA/OPEN SCAR DEBRIDEMENT/POLY EXCHANGE/NERVE EXCISION;  Surgeon: Mauri Pole, MD;  [facility];  Service: Orthopedics;  Laterality: Left;  . JOINT REPLACEMENT Bilateral    4'14  knees  . KNEE SURGERY     x 9 Left Knee  . MAZE  05/28/2016   Procedure: MAZE;  Surgeon: Rexene Alberts, MD;  Location: [place];  Service: Open Heart Surgery;;  . MEDIASTERNOTOMY N/A 05/28/2016   Procedure: MEDIAN STERNOTOMY;  Surgeon: Rexene Alberts, MD;  Location: [place];  Service: Open Heart Surgery;  Laterality: N/A;  . REDUCTION MAMMAPLASTY Bilateral   . SHOULDER SURGERY     Right   . TEE WITHOUT CARDIOVERSION  12/17/2011    Procedure: TRANSESOPHAGEAL ECHOCARDIOGRAM (TEE);  Surgeon: Larey Dresser, MD;  Location: [facility] ENDOSCOPY;  Service: Cardiovascular;  Laterality: N/A;  . TEE WITHOUT CARDIOVERSION  07/28/2012   Procedure: TRANSESOPHAGEAL ECHOCARDIOGRAM (TEE);  Surgeon: Thayer Headings, MD;  Location: [place];  Service: Cardiovascular;  Laterality: N/A;  . TEE WITHOUT CARDIOVERSION N/A 01/01/2016   Procedure: TRANSESOPHAGEAL ECHOCARDIOGRAM (TEE);  Surgeon: Sanda Klein, MD;  Location: [facility] ENDOSCOPY;  Service: Cardiovascular;  Laterality: N/A;  . TEE WITHOUT CARDIOVERSION N/A 05/28/2016   Procedure: TRANSESOPHAGEAL ECHOCARDIOGRAM (TEE);  Surgeon: Rexene Alberts, MD;  Location: [place];  Service: Open Heart Surgery;  Laterality: N/A;  . TOTAL HIP ARTHROPLASTY Left 10/17/2013   Procedure: LEFT TOTAL HIP ARTHROPLASTY ANTERIOR APPROACH;  Surgeon: Mauri Pole, MD;  [facility];  Service: Orthopedics;  Laterality: Left;  . TOTAL KNEE ARTHROPLASTY Right 12/12/2012   Procedure: RIGHT TOTAL KNEE ARTHROPLASTY;  Surgeon: Mauri Pole, MD;  [facility];  Service: Orthopedics;  Laterality: Right;  . TUBAL LIGATION    . UPPER GASTROINTESTINAL ENDOSCOPY  01/30/2010   hiatal hernia, fundic gland polyps  . WRIST SURGERY     Left     Family History  Problem Relation Age of Onset  . Diabetes Mother   . Hyperlipidemia Mother   . Liver disease Father   . Obesity Father   . Diabetes Maternal Grandfather   . Diverticulitis Brother        x 2  . Diverticulitis Paternal Grandmother   . Colon cancer Neg Hx   . Breast cancer Neg Hx     Social History   Socioeconomic History  . Marital status: Married    Spouse name: Not on file  . Number of children: 2  . Years of education: Not on file  . Highest education level: Not on file  Occupational History  . 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 Financial resource strain: Not on file  . Food insecurity:    Worry: Not on file     Inability: Not on file  . Transportation needs:    Medical: Not on file    Non-medical: Not on file  Tobacco Use  . Smoking status: Never Smoker  . Smokeless tobacco: Never Used  Substance and Sexual Activity  . Alcohol use: Yes    Comment: rare social  . Drug use: No  . Sexual activity: Yes    Comment: has periods q 3-4 months- no possiblity pregnant per pt.07-16-11  Lifestyle  . Physical activity:    Days per week: Not on file    Minutes per session: Not on file  . Stress: Not on file  Relationships  . Social connections:    Talks on phone: Not on file    Gets together: Not on file    Attends religious  service: Not on file    Active member of club or organization: Not on file    Attends meetings of clubs or organizations: Not on file    Relationship status: Not on file  Other Topics Concern  . Not on file  Social History Narrative   Updated 06/2015   Daily Caffeine    Pt lives in [place] with spouse and two children 49 and 56 yo in 2-16.  She works as a workers Dance movement psychotherapist.   Christian   No regular exercise, diet ok     Current Outpatient Medications:  .  acetaminophen (TYLENOL) 325 MG tablet, Take 1-2 tablets (325-650 mg total) by mouth every 6 (six) hours as needed for mild pain or headache (For pain.)., Disp: , Rfl:  .  amoxicillin-clavulanate (AUGMENTIN) 875-125 MG tablet, Take 1 tablet by mouth 2 (two) times daily., Disp: 20 tablet, Rfl: 0 .  metoprolol succinate (TOPROL-XL) 100 MG 24 hr tablet, Take 1 tablet in the morning and 1/2 tablet at night., Disp: 45 tablet, Rfl: 6 .  Multiple Vitamin (MULTIVITAMIN) capsule, Take 1 capsule by mouth daily., Disp: , Rfl:  .  pantoprazole (PROTONIX) 40 MG tablet, TAKE 1 TABLET BY MOUTH EVERY DAY AS NEEDED FOR HEARTBURN, Disp: 30 tablet, Rfl: 0 .  XARELTO 20 MG TABS tablet, TAKE 1 TABLET AT BEDTIME, Disp: 30 tablet, Rfl: 1 .  tiZANidine (ZANAFLEX) 2 MG tablet, Take 1 tablet (2 mg total) by mouth 2 (two) times daily  as needed for muscle spasms., Disp: 30 tablet, Rfl: 0  EXAM:  Vitals:   09/01/18 1321  BP: 120/78  Pulse: 68  Temp: 98.2 F (36.8 C)  SpO2: 98%    Body mass index is 38.91 kg/m.  GENERAL: vitals reviewed and listed above, alert, oriented, appears well hydrated and in no acute distress  HEENT: atraumatic, conjunttiva clear, no obvious abnormalities on inspection of external nose and ears, normal appearance of ear canals and TMs, clear nasal congestion, minimal clear effusion R, no bulging, mild post oropharyngeal erythema with PND, no tonsillar edema or exudate, no sinus TTP, some R TMJ TTP muscles, no locking or catching of jaw  NECK: no obvious masses on inspection  MS: moves all extremities without noticeable abnormality  PSYCH: pleasant and cooperative, no obvious depression or anxiety  ASSESSMENT AND PLAN:  Discussed the following assessment and plan:  Sprain of temporomandibular joint, initial encounter  Dysfunction of right eustachian tube  -we discussed possible serious and likely etiologies, workup and treatment, treatment risks and return precautions -after this discussion, Karlea opted for: -home exercises -tizanidine -soft foods -nasal saline  -follow up in 1-2 months, sooner as needed -of course, we advised to return or notify a doctor immediately if symptoms worsen or persist or new concerns arise.    Patient Instructions  BEFORE YOU LEAVE: -TMJ exercises -follow up: annual exam in 1-2 months if due  Soft foods.  Muscle relaxer if needed.  Topical menthol or tylenol per instructions if needed for pain.  I hope you are feeling better soon! Seek care promptly if your symptoms worsen, new concerns arise or you are not improving with treatment.     Lucretia Kern, DO

## 2018-09-01 NOTE — Patient Instructions (Signed)
BEFORE YOU LEAVE: -TMJ exercises -follow up: annual exam in 1-2 months if due  Soft foods.  Muscle relaxer if needed.  Topical menthol or tylenol per instructions if needed for pain.  I hope you are feeling better soon! Seek care promptly if your symptoms worsen, new concerns arise or you are not improving with treatment.

## 2018-09-02 LAB — CUP PACEART REMOTE DEVICE CHECK
Battery Voltage: 3 V
Brady Statistic AP VP Percent: 98.3 %
Brady Statistic AP VS Percent: 1.67 %
Brady Statistic AS VP Percent: 0.02 %
Brady Statistic AS VS Percent: 0.02 %
Brady Statistic RV Percent Paced: 8.67 %
Date Time Interrogation Session: 20191121135948
Implantable Lead Implant Date: 20171023
Implantable Lead Implant Date: 20171023
Implantable Lead Implant Date: 20171023
Implantable Lead Location: 753858
Implantable Lead Location: 753859
Implantable Lead Location: 753860
Implantable Lead Model: 5076
Implantable Lead Model: 5076
Implantable Pulse Generator Implant Date: 20171023
Lead Channel Impedance Value: 323 Ohm
Lead Channel Impedance Value: 361 Ohm
Lead Channel Impedance Value: 361 Ohm
Lead Channel Impedance Value: 380 Ohm
Lead Channel Impedance Value: 418 Ohm
Lead Channel Impedance Value: 418 Ohm
Lead Channel Impedance Value: 551 Ohm
Lead Channel Impedance Value: 551 Ohm
Lead Channel Impedance Value: 589 Ohm
Lead Channel Impedance Value: 608 Ohm
Lead Channel Pacing Threshold Amplitude: 0.5 V
Lead Channel Pacing Threshold Amplitude: 1.375 V
Lead Channel Pacing Threshold Pulse Width: 0.4 ms
Lead Channel Pacing Threshold Pulse Width: 0.4 ms
Lead Channel Sensing Intrinsic Amplitude: 1.75 mV
Lead Channel Sensing Intrinsic Amplitude: 1.75 mV
Lead Channel Sensing Intrinsic Amplitude: 11.75 mV
Lead Channel Sensing Intrinsic Amplitude: 11.75 mV
Lead Channel Setting Pacing Amplitude: 2 V
Lead Channel Setting Pacing Amplitude: 2 V
Lead Channel Setting Pacing Amplitude: 2.5 V
Lead Channel Setting Sensing Sensitivity: 2 mV
MDC IDC MSMT BATTERY REMAINING LONGEVITY: 101 mo
MDC IDC MSMT LEADCHNL LV IMPEDANCE VALUE: 342 Ohm
MDC IDC MSMT LEADCHNL LV IMPEDANCE VALUE: 608 Ohm
MDC IDC MSMT LEADCHNL RA PACING THRESHOLD AMPLITUDE: 0.5 V
MDC IDC MSMT LEADCHNL RA PACING THRESHOLD PULSEWIDTH: 0.4 ms
MDC IDC MSMT LEADCHNL RV IMPEDANCE VALUE: 1045 Ohm
MDC IDC MSMT LEADCHNL RV IMPEDANCE VALUE: 988 Ohm
MDC IDC SET LEADCHNL LV PACING PULSEWIDTH: 0.4 ms
MDC IDC SET LEADCHNL RV PACING PULSEWIDTH: 0.4 ms
MDC IDC STAT BRADY RA PERCENT PACED: 99.96 %

## 2018-09-14 ENCOUNTER — Other Ambulatory Visit: Payer: Self-pay | Admitting: Internal Medicine

## 2018-09-26 ENCOUNTER — Encounter: Payer: 59 | Admitting: Internal Medicine

## 2018-10-06 ENCOUNTER — Ambulatory Visit (INDEPENDENT_AMBULATORY_CARE_PROVIDER_SITE_OTHER): Payer: 59

## 2018-10-06 DIAGNOSIS — I4819 Other persistent atrial fibrillation: Secondary | ICD-10-CM

## 2018-10-06 DIAGNOSIS — R002 Palpitations: Secondary | ICD-10-CM

## 2018-10-06 LAB — CUP PACEART REMOTE DEVICE CHECK
Battery Remaining Longevity: 101 mo
Battery Voltage: 2.99 V
Brady Statistic AP VP Percent: 98.3 %
Brady Statistic AS VS Percent: 0.01 %
Brady Statistic RA Percent Paced: 99.97 %
Brady Statistic RV Percent Paced: 9.54 %
Implantable Lead Implant Date: 20171023
Implantable Lead Implant Date: 20171023
Implantable Lead Location: 753859
Implantable Lead Location: 753860
Implantable Lead Model: 4298
Implantable Lead Model: 5076
Implantable Lead Model: 5076
Lead Channel Impedance Value: 323 Ohm
Lead Channel Impedance Value: 342 Ohm
Lead Channel Impedance Value: 361 Ohm
Lead Channel Impedance Value: 380 Ohm
Lead Channel Impedance Value: 589 Ohm
Lead Channel Impedance Value: 855 Ohm
Lead Channel Impedance Value: 912 Ohm
Lead Channel Pacing Threshold Amplitude: 0.625 V
Lead Channel Pacing Threshold Amplitude: 1.25 V
Lead Channel Pacing Threshold Pulse Width: 0.4 ms
Lead Channel Sensing Intrinsic Amplitude: 1.75 mV
Lead Channel Sensing Intrinsic Amplitude: 1.75 mV
Lead Channel Sensing Intrinsic Amplitude: 17.625 mV
Lead Channel Setting Pacing Amplitude: 1.75 V
Lead Channel Setting Pacing Amplitude: 2.5 V
Lead Channel Setting Pacing Pulse Width: 0.4 ms
MDC IDC LEAD IMPLANT DT: 20171023
MDC IDC LEAD LOCATION: 753858
MDC IDC MSMT LEADCHNL LV IMPEDANCE VALUE: 418 Ohm
MDC IDC MSMT LEADCHNL LV IMPEDANCE VALUE: 532 Ohm
MDC IDC MSMT LEADCHNL LV IMPEDANCE VALUE: 551 Ohm
MDC IDC MSMT LEADCHNL LV IMPEDANCE VALUE: 589 Ohm
MDC IDC MSMT LEADCHNL LV IMPEDANCE VALUE: 608 Ohm
MDC IDC MSMT LEADCHNL LV PACING THRESHOLD PULSEWIDTH: 0.4 ms
MDC IDC MSMT LEADCHNL RA IMPEDANCE VALUE: 304 Ohm
MDC IDC MSMT LEADCHNL RA IMPEDANCE VALUE: 418 Ohm
MDC IDC MSMT LEADCHNL RV PACING THRESHOLD AMPLITUDE: 0.5 V
MDC IDC MSMT LEADCHNL RV PACING THRESHOLD PULSEWIDTH: 0.4 ms
MDC IDC MSMT LEADCHNL RV SENSING INTR AMPL: 17.625 mV
MDC IDC PG IMPLANT DT: 20171023
MDC IDC SESS DTM: 20200223163448
MDC IDC SET LEADCHNL LV PACING PULSEWIDTH: 0.4 ms
MDC IDC SET LEADCHNL RA PACING AMPLITUDE: 2 V
MDC IDC SET LEADCHNL RV SENSING SENSITIVITY: 2 mV
MDC IDC STAT BRADY AP VS PERCENT: 1.67 %
MDC IDC STAT BRADY AS VP PERCENT: 0.02 %

## 2018-10-08 ENCOUNTER — Other Ambulatory Visit: Payer: Self-pay | Admitting: Internal Medicine

## 2018-10-10 NOTE — Telephone Encounter (Signed)
Xarelto 20mg  refill request received; pt is 58 yrs old, wt-126.6kg, Crea-0.84 on 05/18/2017 and there is a note on the appt to obtain labs on 11/14/2018 when pt comes to see Dr. Rayann Heman, CrCl-151.63ml/min.

## 2018-10-13 NOTE — Progress Notes (Signed)
Remote pacemaker transmission.   

## 2018-10-14 ENCOUNTER — Encounter: Payer: Self-pay | Admitting: Cardiology

## 2018-11-09 ENCOUNTER — Telehealth: Payer: Self-pay

## 2018-11-09 NOTE — Telephone Encounter (Signed)
Called pt to verify email and phone number for VIDEO visit with [facility] 11-14-2018. Pt is aware she is to have her BP/Pulse and list of current medications ready prior to visit. Pt was able to download webex on their device. Instructed the pt to check her mychart for messages and she can call the office back if she has any questions.

## 2018-11-14 ENCOUNTER — Telehealth (INDEPENDENT_AMBULATORY_CARE_PROVIDER_SITE_OTHER): Payer: 59 | Admitting: Internal Medicine

## 2018-11-14 ENCOUNTER — Other Ambulatory Visit: Payer: Self-pay

## 2018-11-14 DIAGNOSIS — I428 Other cardiomyopathies: Secondary | ICD-10-CM | POA: Diagnosis not present

## 2018-11-14 DIAGNOSIS — I4819 Other persistent atrial fibrillation: Secondary | ICD-10-CM

## 2018-11-14 DIAGNOSIS — I495 Sick sinus syndrome: Secondary | ICD-10-CM | POA: Diagnosis not present

## 2018-11-14 DIAGNOSIS — Z79899 Other long term (current) drug therapy: Secondary | ICD-10-CM

## 2018-11-14 DIAGNOSIS — E663 Overweight: Secondary | ICD-10-CM

## 2018-11-14 MED ORDER — METOPROLOL SUCCINATE ER 100 MG PO TB24: 100.0000 mg | ORAL_TABLET | Freq: Every day | ORAL | 3 refills | Status: DC

## 2018-11-14 NOTE — Progress Notes (Signed)
Electrophysiology TeleHealth Note   Due to national recommendations of social distancing due to COVID 19, an audio/video telehealth visit is felt to be most appropriate for this patient at this time.  See MyChart message from today for the patient's consent to telehealth for [facility].   Date:  11/14/2018   ID:  Destiny Dennis, DOB 10/28/1960, MRN [id number]  Location: patient's home  Provider location: [address] [place]  Evaluation Performed: Follow-up visit  PCP:  Lucretia Kern, DO  Cardiologist:  No primary care provider on file. Dr Stanford Breed Electrophysiologist:  Dr Rayann Heman  Chief Complaint:  afib  History of Present Illness:    Destiny Dennis is a 58 y.o. female who presents via audio/video conferencing for a telehealth visit today.  Since last being seen in our clinic, the patient reports doing very well. 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). Today, she denies symptoms of palpitations, chest pain, shortness of breath,  lower extremity edema, dizziness, presyncope, or syncope.  The patient is otherwise without complaint today.  The patient denies symptoms of fevers, chills,  or new SOB worrisome for COVID 19.  She has had a nonproductive cough for several months which is stable.  Past Medical History:  Diagnosis Date  . Allergic rhinitis   . Anxiety    when tachycardia occurs  . Arthritis    OA, s/p numerous surgeries -back, knees, hip  . Benign fundic gland polyps of stomach   . Cervical disc disease   . Diverticulitis    CT Scan   . DVT (deep venous thrombosis) ([place])    in pregnancy, s/p hip surgery  . Eustachian tube dysfunction   . Fatty liver   . Gallbladder problem   . GERD 12/23/2009   diet related-not a problem  . Hemorrhoids   . Hiatal hernia    small  . History of MRSA infection 2008   superficial skin-cleared easily with doxycycline  . Obesity   . Peripheral vascular disease  ([place])    left leg after hip surgery  . Persistent atrial fibrillation 03/05/2007   a. s/p RFCA 12/18/2011, 07/29/12  . s/p atrial septal defect repair 05/28/2016  . S/P Maze operation for atrial fibrillation 05/28/2016   Complete bilateral atrial lesion set using cryothermy and bipolar radiofrequency ablation with clipping of LA appendage via median sternotomy  . Sialoadenitis   . Tachy-brady syndrome [facility])     Past Surgical History:  Procedure Laterality Date  . ASD REPAIR N/A 05/28/2016   Procedure: ATRIAL SEPTAL DEFECT (ASD) closure;  Surgeon: Rexene Alberts, MD;  Location: [place];  Service: Open Heart Surgery;  Laterality: N/A;  . ATRIAL FIBRILLATION ABLATION N/A 12/18/2011   Procedure: ATRIAL FIBRILLATION ABLATION;  Surgeon: [name], MD;  Location: [facility] CATH LAB;  Service: Cardiovascular;  Laterality: N/A;  . ATRIAL FIBRILLATION ABLATION N/A 07/29/2012   Procedure: ATRIAL FIBRILLATION ABLATION;  Surgeon: [name], MD;  Location: [facility] CATH LAB;  Service: Cardiovascular;  Laterality: N/A;  . CARDIAC CATHETERIZATION N/A 04/10/2016   Procedure: Right/Left Heart Cath and Coronary Angiography;  Surgeon: Jettie Booze, MD;  Location: [place] CV LAB;  Service: Cardiovascular;  Laterality: N/A;  . CERVICAL FUSION    . CESAREAN SECTION     x 2  . CHOLECYSTECTOMY    . CLIPPING OF ATRIAL APPENDAGE  05/28/2016   Procedure: CLIPPING OF ATRIAL APPENDAGE;  Surgeon: Braulio Conte  Keturah Barre, MD;  Location: [place];  Service: Open Heart Surgery;;  . COLONOSCOPY  07/16/2011   diverticulosis  . ELBOW SURGERY     Right  . EP IMPLANTABLE DEVICE N/A 09/10/2015   Procedure: Loop Recorder Insertion;  Surgeon: [name], MD;  Location: [place] CV LAB;  Service: Cardiovascular;  Laterality: N/A;  . EP IMPLANTABLE DEVICE N/A 06/08/2016   MDT Marcelino Scot CRT-P implanted by Dr Curt Bears  . FLEXIBLE SIGMOIDOSCOPY     1990's  . FOOT SURGERY     Right   . I&D KNEE WITH POLY EXCHANGE Left 12/12/2012    Procedure: LEFT KNEE EXCISION SAPHENOUS NEUROMA/OPEN SCAR DEBRIDEMENT/POLY EXCHANGE/NERVE EXCISION;  Surgeon: Mauri Pole, MD;  [facility];  Service: Orthopedics;  Laterality: Left;  . JOINT REPLACEMENT Bilateral    4'14  knees  . KNEE SURGERY     x 9 Left Knee  . MAZE  05/28/2016   Procedure: MAZE;  Surgeon: Rexene Alberts, MD;  Location: [place];  Service: Open Heart Surgery;;  . MEDIASTERNOTOMY N/A 05/28/2016   Procedure: MEDIAN STERNOTOMY;  Surgeon: Rexene Alberts, MD;  Location: [place];  Service: Open Heart Surgery;  Laterality: N/A;  . REDUCTION MAMMAPLASTY Bilateral   . SHOULDER SURGERY     Right   . TEE WITHOUT CARDIOVERSION  12/17/2011   Procedure: TRANSESOPHAGEAL ECHOCARDIOGRAM (TEE);  Surgeon: Larey Dresser, MD;  Location: [facility] ENDOSCOPY;  Service: Cardiovascular;  Laterality: N/A;  . TEE WITHOUT CARDIOVERSION  07/28/2012   Procedure: TRANSESOPHAGEAL ECHOCARDIOGRAM (TEE);  Surgeon: Thayer Headings, MD;  Location: [place];  Service: Cardiovascular;  Laterality: N/A;  . TEE WITHOUT CARDIOVERSION N/A 01/01/2016   Procedure: TRANSESOPHAGEAL ECHOCARDIOGRAM (TEE);  Surgeon: Sanda Klein, MD;  Location: [facility] ENDOSCOPY;  Service: Cardiovascular;  Laterality: N/A;  . TEE WITHOUT CARDIOVERSION N/A 05/28/2016   Procedure: TRANSESOPHAGEAL ECHOCARDIOGRAM (TEE);  Surgeon: Rexene Alberts, MD;  Location: [place];  Service: Open Heart Surgery;  Laterality: N/A;  . TOTAL HIP ARTHROPLASTY Left 10/17/2013   Procedure: LEFT TOTAL HIP ARTHROPLASTY ANTERIOR APPROACH;  Surgeon: Mauri Pole, MD;  [facility];  Service: Orthopedics;  Laterality: Left;  . TOTAL KNEE ARTHROPLASTY Right 12/12/2012   Procedure: RIGHT TOTAL KNEE ARTHROPLASTY;  Surgeon: Mauri Pole, MD;  [facility];  Service: Orthopedics;  Laterality: Right;  . TUBAL LIGATION    . UPPER GASTROINTESTINAL ENDOSCOPY  01/30/2010   hiatal hernia, fundic gland polyps  . WRIST SURGERY     Left     Current Outpatient  Medications  Medication Sig Dispense Refill  . acetaminophen (TYLENOL) 325 MG tablet Take 1-2 tablets (325-650 mg total) by mouth every 6 (six) hours as needed for mild pain or headache (For pain.).    Marland Kitchen metoprolol succinate (TOPROL-XL) 100 MG 24 hr tablet Take 1 tablet in the morning and 1/2 tablet at night. Please keep upcoming appt in February for future refills. Thank you 45 tablet 0  . Multiple Vitamin (MULTIVITAMIN) capsule Take 1 capsule by mouth daily.    . pantoprazole (PROTONIX) 40 MG tablet TAKE 1 TABLET BY MOUTH EVERY DAY AS NEEDED FOR HEARTBURN 30 tablet 0  . tiZANidine (ZANAFLEX) 2 MG tablet Take 1 tablet (2 mg total) by mouth 2 (two) times daily as needed for muscle spasms. 30 tablet 0  . XARELTO 20 MG TABS tablet TAKE 1 TABLET BY MOUTH EVERYDAY AT BEDTIME 30 tablet 0  . amoxicillin-clavulanate (AUGMENTIN) 875-125 MG tablet Take 1 tablet  by mouth 2 (two) times daily. (Patient not taking: Reported on 11/14/2018) 20 tablet 0   No current facility-administered medications for this visit.     Allergies:   Avelox [moxifloxacin hcl in nacl]   Social History:  The patient  reports that she has never smoked. She has never used smokeless tobacco. She reports current alcohol use. She reports that she does not use drugs.   Family History:  The patient's  family history includes Diabetes in her maternal grandfather and mother; Diverticulitis in her brother and paternal grandmother; Hyperlipidemia in her mother; Liver disease in her father; Obesity in her father.   ROS:  Please see the history of present illness.   All other systems are personally reviewed and negative.    Exam:    Vital Signs:  BP 122/73   Pulse 68   LMP 04/17/2012   Well appearing, alert and conversant, regular work of breathing,  good skin color Eyes- anicteric, neuro- grossly intact, skin- no apparent rash or lesions or cyanosis, mouth- oral mucosa is pink   Labs/Other Tests and Data Reviewed:    Recent Labs:  No results found for requested labs within last 8760 hours.   Wt Readings from Last 3 Encounters:  09/01/18 279 lb (126.6 kg)  04/04/18 276 lb 6 oz (125.4 kg)  07/15/17 264 lb 1.6 oz (119.8 kg)     Other studies personally reviewed: Additional studies/ records that were reviewed today include: EP NP note from 07/05/17 is reviewed , prior TEE is reviewed Review of the above records today demonstrates: as above  Last device remote is reviewed from [facility] PDF dated 10/09/2018 which reveals normal device function, no arrhythmias, no afib   ASSESSMENT & PLAN:    1.  Sick sinus syndrome S/p PPM (CRT-P) Normal device function by recent remotes Continue remote follow-up  2. Persistent afib Well controlled post MAZE Her chads2vasc score is 1-2 (EF has normalized).  She is clear in her decision to stop xarelto.  This is supported by 2019 update to afib management guidelines. Reduce toprol to 100mg  daily.  She may reduce to 50mg  daily in 2-3 months  3. Nonischemic CM EF normalized  Consider echo on return in a year  4. Overweight Lifestyle modification is encouraged  5. COVID 19 screen The patient denies symptoms of COVID 19 at this time.  The importance of social distancing was discussed today.  Follow-up:  12 months with me Next remote: 12/2018  Current medicines are reviewed at length with the patient today.   The patient does not have concerns regarding her medicines.  The following changes were made today:  none  Labs/ tests ordered today include:  No orders of the defined types were placed in this encounter.   Patient Risk:  after full review of this patients clinical status, I feel that they are at moderate risk at this time.  Today, I have spent 22 minutes with the patient with telehealth technology discussing afib management .    SignedThompson Grayer, MD  11/14/2018 3:17 PM     [address] [place] [address] [phone]  (office) [phone] (fax)

## 2018-11-15 ENCOUNTER — Ambulatory Visit (INDEPENDENT_AMBULATORY_CARE_PROVIDER_SITE_OTHER): Payer: 59 | Admitting: Family Medicine

## 2018-11-15 ENCOUNTER — Encounter: Payer: Self-pay | Admitting: Family Medicine

## 2018-11-15 ENCOUNTER — Telehealth: Payer: Self-pay | Admitting: Family Medicine

## 2018-11-15 ENCOUNTER — Other Ambulatory Visit: Payer: Self-pay | Admitting: Internal Medicine

## 2018-11-15 ENCOUNTER — Other Ambulatory Visit: Payer: Self-pay

## 2018-11-15 VITALS — Temp 98.4°F

## 2018-11-15 DIAGNOSIS — R05 Cough: Secondary | ICD-10-CM | POA: Diagnosis not present

## 2018-11-15 DIAGNOSIS — J40 Bronchitis, not specified as acute or chronic: Secondary | ICD-10-CM | POA: Diagnosis not present

## 2018-11-15 DIAGNOSIS — R059 Cough, unspecified: Secondary | ICD-10-CM

## 2018-11-15 DIAGNOSIS — T7840XA Allergy, unspecified, initial encounter: Secondary | ICD-10-CM | POA: Diagnosis not present

## 2018-11-15 MED ORDER — HYDROCOD POLST-CPM POLST ER 10-8 MG/5ML PO SUER: 5.0000 mL | Freq: Two times a day (BID) | ORAL | 0 refills | Status: DC

## 2018-11-15 NOTE — Progress Notes (Signed)
Virtual Visit via Video Note  I connected with Destiny Dennis on 11/15/18 at  3:30 PM EDT by a video enabled telemedicine application and verified that I am speaking with the correct person using two identifiers.  Location patient: home Location provider:work or home office Persons participating in the virtual visit: patient, provider  I discussed the limitations of evaluation and management by telemedicine and the availability of in person appointments. The patient expressed understanding and agreed to proceed.   HPI:  Cough and congestion: -this spell started 5 days ago  -had bronchitis in February and took abx (augmentin) - no fever, body aches - but had a lot of sinus congestion, given 2 week course of steroids but did not take as she felt a lot better after starting the abx and did not need to use (has 10mg  tablets) -youngest son was sick with a cough a week ago -she started getting a lot of nasal congestion, drainage and a cough - cough worse at night -allergies have been bad - xyzal and flonase -no fevers, sob, body aches, chest pain -this is pretty normal for her for this time of the year -she reports tussinex usually is the best thing when she gets this along with prednisone -no travel, no exposure to anyone with COVID19  ROS: See pertinent positives and negatives per HPI.  Past Medical History:  Diagnosis Date  . Allergic rhinitis   . Anxiety    when tachycardia occurs  . Arthritis    OA, s/p numerous surgeries -back, knees, hip  . Benign fundic gland polyps of stomach   . Cervical disc disease   . Diverticulitis    CT Scan   . DVT (deep venous thrombosis) ([place])    in pregnancy, s/p hip surgery  . Eustachian tube dysfunction   . Fatty liver   . Gallbladder problem   . GERD 12/23/2009   diet related-not a problem  . Hemorrhoids   . Hiatal hernia    small  . History of MRSA infection 2008   superficial skin-cleared easily with doxycycline  . Obesity   . Peripheral  vascular disease ([place])    left leg after hip surgery  . Persistent atrial fibrillation 03/05/2007   a. s/p RFCA 12/18/2011, 07/29/12  . s/p atrial septal defect repair 05/28/2016  . S/P Maze operation for atrial fibrillation 05/28/2016   Complete bilateral atrial lesion set using cryothermy and bipolar radiofrequency ablation with clipping of LA appendage via median sternotomy  . Sialoadenitis   . Tachy-brady syndrome [facility])     Past Surgical History:  Procedure Laterality Date  . ASD REPAIR N/A 05/28/2016   Procedure: ATRIAL SEPTAL DEFECT (ASD) closure;  Surgeon: Rexene Alberts, MD;  Location: [place];  Service: Open Heart Surgery;  Laterality: N/A;  . ATRIAL FIBRILLATION ABLATION N/A 12/18/2011   Procedure: ATRIAL FIBRILLATION ABLATION;  Surgeon: [name], MD;  Location: [facility] CATH LAB;  Service: Cardiovascular;  Laterality: N/A;  . ATRIAL FIBRILLATION ABLATION N/A 07/29/2012   Procedure: ATRIAL FIBRILLATION ABLATION;  Surgeon: [name], MD;  Location: [facility] CATH LAB;  Service: Cardiovascular;  Laterality: N/A;  . CARDIAC CATHETERIZATION N/A 04/10/2016   Procedure: Right/Left Heart Cath and Coronary Angiography;  Surgeon: Jettie Booze, MD;  Location: [place] CV LAB;  Service: Cardiovascular;  Laterality: N/A;  . CERVICAL FUSION    . CESAREAN SECTION     x 2  . CHOLECYSTECTOMY    . CLIPPING OF ATRIAL APPENDAGE  05/28/2016   Procedure: CLIPPING  OF ATRIAL APPENDAGE;  Surgeon: Rexene Alberts, MD;  Location: [place];  Service: Open Heart Surgery;;  . COLONOSCOPY  07/16/2011   diverticulosis  . ELBOW SURGERY     Right  . EP IMPLANTABLE DEVICE N/A 09/10/2015   Procedure: Loop Recorder Insertion;  Surgeon: [name], MD;  Location: [place] CV LAB;  Service: Cardiovascular;  Laterality: N/A;  . EP IMPLANTABLE DEVICE N/A 06/08/2016   MDT Marcelino Scot CRT-P implanted by Dr Curt Bears  . FLEXIBLE SIGMOIDOSCOPY     1990's  . FOOT SURGERY     Right   . I&D KNEE WITH POLY EXCHANGE  Left 12/12/2012   Procedure: LEFT KNEE EXCISION SAPHENOUS NEUROMA/OPEN SCAR DEBRIDEMENT/POLY EXCHANGE/NERVE EXCISION;  Surgeon: Mauri Pole, MD;  [facility];  Service: Orthopedics;  Laterality: Left;  . JOINT REPLACEMENT Bilateral    4'14  knees  . KNEE SURGERY     x 9 Left Knee  . MAZE  05/28/2016   Procedure: MAZE;  Surgeon: Rexene Alberts, MD;  Location: [place];  Service: Open Heart Surgery;;  . MEDIASTERNOTOMY N/A 05/28/2016   Procedure: MEDIAN STERNOTOMY;  Surgeon: Rexene Alberts, MD;  Location: [place];  Service: Open Heart Surgery;  Laterality: N/A;  . REDUCTION MAMMAPLASTY Bilateral   . SHOULDER SURGERY     Right   . TEE WITHOUT CARDIOVERSION  12/17/2011   Procedure: TRANSESOPHAGEAL ECHOCARDIOGRAM (TEE);  Surgeon: Larey Dresser, MD;  Location[facility] ENDOSCOPY;  Service: Cardiovascular;  Laterality: N/A;  . TEE WITHOUT CARDIOVERSION  07/28/2012   Procedure: TRANSESOPHAGEAL ECHOCARDIOGRAM (TEE);  Surgeon: Thayer Headings, MD;  Location: [place];  Service: Cardiovascular;  Laterality: N/A;  . TEE WITHOUT CARDIOVERSION N/A 01/01/2016   Procedure: TRANSESOPHAGEAL ECHOCARDIOGRAM (TEE);  Surgeon: Sanda Klein, MD;  Location: [facility] ENDOSCOPY;  Service: Cardiovascular;  Laterality: N/A;  . TEE WITHOUT CARDIOVERSION N/A 05/28/2016   Procedure: TRANSESOPHAGEAL ECHOCARDIOGRAM (TEE);  Surgeon: Rexene Alberts, MD;  Location: [place];  Service: Open Heart Surgery;  Laterality: N/A;  . TOTAL HIP ARTHROPLASTY Left 10/17/2013   Procedure: LEFT TOTAL HIP ARTHROPLASTY ANTERIOR APPROACH;  Surgeon: Mauri Pole, MD;  [facility];  Service: Orthopedics;  Laterality: Left;  . TOTAL KNEE ARTHROPLASTY Right 12/12/2012   Procedure: RIGHT TOTAL KNEE ARTHROPLASTY;  Surgeon: Mauri Pole, MD;  [facility];  Service: Orthopedics;  Laterality: Right;  . TUBAL LIGATION    . UPPER GASTROINTESTINAL ENDOSCOPY  01/30/2010   hiatal hernia, fundic gland polyps  . WRIST SURGERY     Left     Family  History  Problem Relation Age of Onset  . Diabetes Mother   . Hyperlipidemia Mother   . Liver disease Father   . Obesity Father   . Diabetes Maternal Grandfather   . Diverticulitis Brother        x 2  . Diverticulitis Paternal Grandmother   . Colon cancer Neg Hx   . Breast cancer Neg Hx     SOCIAL HX:    Current Outpatient Medications:  .  acetaminophen (TYLENOL) 325 MG tablet, Take 1-2 tablets (325-650 mg total) by mouth every 6 (six) hours as needed for mild pain or headache (For pain.)., Disp: , Rfl:  .  amoxicillin-clavulanate (AUGMENTIN) 875-125 MG tablet, Take 1 tablet by mouth 2 (two) times daily., Disp: 20 tablet, Rfl: 0 .  metoprolol succinate (TOPROL-XL) 100 MG 24 hr tablet, Take 1 tablet (100 mg total) by mouth daily., Disp: 90 tablet, Rfl: 1 .  Multiple Vitamin (MULTIVITAMIN) capsule, Take 1 capsule by mouth daily., Disp: , Rfl:  .  pantoprazole (PROTONIX) 40 MG tablet, TAKE 1 TABLET BY MOUTH EVERY DAY AS NEEDED FOR HEARTBURN, Disp: 30 tablet, Rfl: 0 .  tiZANidine (ZANAFLEX) 2 MG tablet, Take 1 tablet (2 mg total) by mouth 2 (two) times daily as needed for muscle spasms., Disp: 30 tablet, Rfl: 0 .  chlorpheniramine-HYDROcodone (TUSSIONEX PENNKINETIC ER) 10-8 MG/5ML SUER, Take 5 mLs by mouth 2 (two) times daily., Disp: 10 mL, Rfl: 0  EXAM:  VITALS per patient if applicable: 32.3, BP 557/32, P 77  GENERAL: alert, oriented, appears well and in no acute distress  HEENT: atraumatic, conjunttiva clear, no obvious abnormalities on inspection of external nose and ears  NECK: normal movements of the head and neck  LUNGS: on inspection no signs of respiratory distress, breathing rate appears normal, no obvious gross SOB, gasping or wheezing  CV: no obvious cyanosis  MS: moves all visible extremities without noticeable abnormality  PSYCH/NEURO: pleasant and cooperative, no obvious depression or anxiety, speech and thought processing grossly intact  ASSESSMENT AND  PLAN:  Discussed the following assessment and plan:  Cough  Allergic state, initial encounter  Bronchitis  -we discussed possible serious and likely etiologies, workup and treatment, treatment risks and return precautions; she feels this is similar to her yearly allergy/VURI related symptoms and reports cough syrup and steroid helps and feels she needs this as reports the cough is not letting her sleep. Discussed potential for COVID19 or other etiology - though seems unlikely given her isolated state and no other symptoms.  -after this discussion, Destiny Dennis opted for cough syrup (discussed risks, interactions sand proper use at length - only 5 days provided), prednisone (she has at home) after lengthy discussion risks vs benefit and potential risks with COVID19. -follow up advised closely in 2 days -of course, we advised Destiny Dennis  to return or notify a doctor immediately if symptoms worsen or persist or new concerns arise.  I discussed the assessment and treatment plan with the patient. The patient was provided an opportunity to ask questions and all were answered. The patient agreed with the plan and demonstrated an understanding of the instructions.   The patient was advised to call back or seek an in-person evaluation if the symptoms worsen or if the condition fails to improve as anticipated.   Follow up instructions: Advised assistant Wendie Simmer to help patient arrange the following: -Webx thurday or Monday (2-6 days)   Lucretia Kern, DO

## 2018-11-15 NOTE — Patient Instructions (Signed)
I sent the cough syrup to the pharmacy. Please do not use with any other sedating medication or alcohol. Please do not drive while taking that medication.  Can use the prednisone 40mg  daily for 4 days.  Follow up on Thursday or Monday.  I hope you are feeling better soon! Seek care promptly if your symptoms worsen, new concerns arise or you are not improving with treatment.

## 2018-11-15 NOTE — Telephone Encounter (Signed)
Pt called stating that her pharmacy had not received her RX for Tussionex [place]. Call place to pharmacist [facility].  She states she had placed a call to [place] th verify despensing order for 38mls.  Refills 0 Order verified by Office visit documentation.

## 2018-11-16 ENCOUNTER — Other Ambulatory Visit: Payer: Self-pay | Admitting: Family Medicine

## 2018-11-16 ENCOUNTER — Telehealth: Payer: Self-pay | Admitting: Family Medicine

## 2018-11-16 MED ORDER — HYDROCOD POLST-CPM POLST ER 10-8 MG/5ML PO SUER: 5.0000 mL | Freq: Two times a day (BID) | ORAL | 0 refills | Status: DC

## 2018-11-16 NOTE — Telephone Encounter (Signed)
I have resent rx for 5 day supply as intended from [facility] note.

## 2018-11-16 NOTE — Telephone Encounter (Signed)
I called the pharmacy and spoke with Robin.  Robin asked if the physician wanted the pt to have 36ml as this is only enough for 2 doses?  She stated if more is needed a new Rx would need to be sent by the physician.  Message sent to Dr Ethlyn Gallery as Dr Maudie Mercury is out of the office.

## 2018-11-16 NOTE — Telephone Encounter (Signed)
I called the pt and informed her of the message below

## 2018-11-16 NOTE — Telephone Encounter (Signed)
[facility] called and l/m needing clarification on pt's rx for tussionex , the rx is requesting 75ml. Would like a call back at [phone]

## 2018-11-17 ENCOUNTER — Other Ambulatory Visit: Payer: Self-pay

## 2018-11-17 ENCOUNTER — Ambulatory Visit: Payer: 59 | Admitting: Family Medicine

## 2018-11-17 DIAGNOSIS — R059 Cough, unspecified: Secondary | ICD-10-CM

## 2018-11-17 DIAGNOSIS — R05 Cough: Secondary | ICD-10-CM

## 2018-11-17 NOTE — Progress Notes (Signed)
Virtual Visit via Video Note  I connected with Destiny Dennis on 11/17/18 at 10:45 AM EDT by a video enabled telemedicine application and verified that I am speaking with the correct person using two identifiers. Pt was not able to long into the video portion of this visit, but was completed successfully by phone.  Location patient: home Location provider:work or home office Persons participating in the virtual visit: patient, provider  I discussed the limitations of evaluation and management by telemedicine and the availability of in person appointments. The patient expressed understanding and agreed to proceed.   HPI:  Follow up cough and congestion: -see visit 11/15/18 for details current illness, reviewed today -reports: doing so much better! She reports she is amazed at how much better she is - mild cough but otherwise  -denies: fevers, sob, wheezing, malaise  ROS: See pertinent positives and negatives per HPI.  Past Medical History:  Diagnosis Date  . Allergic rhinitis   . Anxiety    when tachycardia occurs  . Arthritis    OA, s/p numerous surgeries -back, knees, hip  . Benign fundic gland polyps of stomach   . Cervical disc disease   . Diverticulitis    CT Scan   . DVT (deep venous thrombosis) ([place])    in pregnancy, s/p hip surgery  . Eustachian tube dysfunction   . Fatty liver   . Gallbladder problem   . GERD 12/23/2009   diet related-not a problem  . Hemorrhoids   . Hiatal hernia    small  . History of MRSA infection 2008   superficial skin-cleared easily with doxycycline  . Obesity   . Peripheral vascular disease ([place])    left leg after hip surgery  . Persistent atrial fibrillation 03/05/2007   a. s/p RFCA 12/18/2011, 07/29/12  . s/p atrial septal defect repair 05/28/2016  . S/P Maze operation for atrial fibrillation 05/28/2016   Complete bilateral atrial lesion set using cryothermy and bipolar radiofrequency ablation with clipping of LA appendage via median sternotomy  .  Sialoadenitis   . Tachy-brady syndrome [facility])     Past Surgical History:  Procedure Laterality Date  . ASD REPAIR N/A 05/28/2016   Procedure: ATRIAL SEPTAL DEFECT (ASD) closure;  Surgeon: Rexene Alberts, MD;  Location: [place];  Service: Open Heart Surgery;  Laterality: N/A;  . ATRIAL FIBRILLATION ABLATION N/A 12/18/2011   Procedure: ATRIAL FIBRILLATION ABLATION;  Surgeon: [name], MD;  Location: [facility] CATH LAB;  Service: Cardiovascular;  Laterality: N/A;  . ATRIAL FIBRILLATION ABLATION N/A 07/29/2012   Procedure: ATRIAL FIBRILLATION ABLATION;  Surgeon: [name], MD;  Location: [facility] LAB;  Service: Cardiovascular;  Laterality: N/A;  . CARDIAC CATHETERIZATION N/A 04/10/2016   Procedure: Right/Left Heart Cath and Coronary Angiography;  Surgeon: Jettie Booze, MD;  Location: [place] CV LAB;  Service: Cardiovascular;  Laterality: N/A;  . CERVICAL FUSION    . CESAREAN SECTION     x 2  . CHOLECYSTECTOMY    . CLIPPING OF ATRIAL APPENDAGE  05/28/2016   Procedure: CLIPPING OF ATRIAL APPENDAGE;  Surgeon: Rexene Alberts, MD;  Location: [place];  Service: Open Heart Surgery;;  . COLONOSCOPY  07/16/2011   diverticulosis  . ELBOW SURGERY     Right  . EP IMPLANTABLE DEVICE N/A 09/10/2015   Procedure: Loop Recorder Insertion;  Surgeon: [name], MD;  Location: [place] CV LAB;  Service: Cardiovascular;  Laterality: N/A;  . EP IMPLANTABLE DEVICE N/A 06/08/2016   MDT Marcelino Scot CRT-P  implanted by Dr Curt Bears  . FLEXIBLE SIGMOIDOSCOPY     1990's  . FOOT SURGERY     Right   . I&D KNEE WITH POLY EXCHANGE Left 12/12/2012   Procedure: LEFT KNEE EXCISION SAPHENOUS NEUROMA/OPEN SCAR DEBRIDEMENT/POLY EXCHANGE/NERVE EXCISION;  Surgeon: Mauri Pole, MD;  [facility];  Service: Orthopedics;  Laterality: Left;  . JOINT REPLACEMENT Bilateral    4'14  knees  . KNEE SURGERY     x 9 Left Knee  . MAZE  05/28/2016   Procedure: MAZE;  Surgeon: Rexene Alberts, MD;  Location: [place];   Service: Open Heart Surgery;;  . MEDIASTERNOTOMY N/A 05/28/2016   Procedure: MEDIAN STERNOTOMY;  Surgeon: Rexene Alberts, MD;  Location: [place];  Service: Open Heart Surgery;  Laterality: N/A;  . REDUCTION MAMMAPLASTY Bilateral   . SHOULDER SURGERY     Right   . TEE WITHOUT CARDIOVERSION  12/17/2011   Procedure: TRANSESOPHAGEAL ECHOCARDIOGRAM (TEE);  Surgeon: Larey Dresser, MD;  Location: [facility] ENDOSCOPY;  Service: Cardiovascular;  Laterality: N/A;  . TEE WITHOUT CARDIOVERSION  07/28/2012   Procedure: TRANSESOPHAGEAL ECHOCARDIOGRAM (TEE);  Surgeon: Thayer Headings, MD;  Location: [place];  Service: Cardiovascular;  Laterality: N/A;  . TEE WITHOUT CARDIOVERSION N/A 01/01/2016   Procedure: TRANSESOPHAGEAL ECHOCARDIOGRAM (TEE);  Surgeon: Sanda Klein, MD;  Location: [facility] ENDOSCOPY;  Service: Cardiovascular;  Laterality: N/A;  . TEE WITHOUT CARDIOVERSION N/A 05/28/2016   Procedure: TRANSESOPHAGEAL ECHOCARDIOGRAM (TEE);  Surgeon: Rexene Alberts, MD;  Location: [place];  Service: Open Heart Surgery;  Laterality: N/A;  . TOTAL HIP ARTHROPLASTY Left 10/17/2013   Procedure: LEFT TOTAL HIP ARTHROPLASTY ANTERIOR APPROACH;  Surgeon: Mauri Pole, MD;  [facility];  Service: Orthopedics;  Laterality: Left;  . TOTAL KNEE ARTHROPLASTY Right 12/12/2012   Procedure: RIGHT TOTAL KNEE ARTHROPLASTY;  Surgeon: Mauri Pole, MD;  [facility];  Service: Orthopedics;  Laterality: Right;  . TUBAL LIGATION    . UPPER GASTROINTESTINAL ENDOSCOPY  01/30/2010   hiatal hernia, fundic gland polyps  . WRIST SURGERY     Left     Family History  Problem Relation Age of Onset  . Diabetes Mother   . Hyperlipidemia Mother   . Liver disease Father   . Obesity Father   . Diabetes Maternal Grandfather   . Diverticulitis Brother        x 2  . Diverticulitis Paternal Grandmother   . Colon cancer Neg Hx   . Breast cancer Neg Hx     SOCIAL HX: see hpi   Current Outpatient Medications:  .  acetaminophen  (TYLENOL) 325 MG tablet, Take 1-2 tablets (325-650 mg total) by mouth every 6 (six) hours as needed for mild pain or headache (For pain.)., Disp: , Rfl:  .  amoxicillin-clavulanate (AUGMENTIN) 875-125 MG tablet, Take 1 tablet by mouth 2 (two) times daily., Disp: 20 tablet, Rfl: 0 .  chlorpheniramine-HYDROcodone (TUSSIONEX PENNKINETIC ER) 10-8 MG/5ML SUER, Take 5 mLs by mouth 2 (two) times daily., Disp: 50 mL, Rfl: 0 .  metoprolol succinate (TOPROL-XL) 100 MG 24 hr tablet, Take 1 tablet (100 mg total) by mouth daily., Disp: 90 tablet, Rfl: 1 .  Multiple Vitamin (MULTIVITAMIN) capsule, Take 1 capsule by mouth daily., Disp: , Rfl:  .  pantoprazole (PROTONIX) 40 MG tablet, TAKE 1 TABLET BY MOUTH EVERY DAY AS NEEDED FOR HEARTBURN, Disp: 30 tablet, Rfl: 0 .  tiZANidine (ZANAFLEX) 2 MG tablet, Take 1 tablet (2 mg total) by mouth  2 (two) times daily as needed for muscle spasms., Disp: 30 tablet, Rfl: 0  EXAM:  VITALS per patient if applicable: none  GENERAL: alert, oriented, no audible signs of acute distress   LUNGS: no audible signs of respiratory distress,  No audible SOB, gasping or wheezing  PSYCH/NEURO: pleasant and cooperative,  speech and thought processing grossly intact  ASSESSMENT AND PLAN:  Discussed the following assessment and plan: More than 50% of over 8 minutes spent in total in caring for this patient was spent face-to-face with the patient, counseling and/or coordinating care.    Cough  Glad she is doing so much better. Opted to taper off the prednisone as did cause her to be "revved up" a little when took 40mg . Advised 320 mg the next 2 days then stop. Prn cough syrup.   Advised follow up if recurrent, worsening or new concerns.  She is aware I am leaving clinical practice and agrees to arrange new PCP. Her husband sees Dr. Yong Channel and she plans to call there to see if she can establish with him. Advised can call here for next 30 days if needs anything and advised of other  docs here taking new patients.   I discussed the assessment and treatment plan with the patient. The patient was provided an opportunity to ask questions and all were answered. The patient agreed with the plan and demonstrated an understanding of the instructions.     Lucretia Kern, DO

## 2018-12-03 ENCOUNTER — Other Ambulatory Visit: Payer: Self-pay | Admitting: Internal Medicine

## 2019-01-05 ENCOUNTER — Other Ambulatory Visit: Payer: Self-pay | Admitting: Family Medicine

## 2019-01-05 ENCOUNTER — Ambulatory Visit (INDEPENDENT_AMBULATORY_CARE_PROVIDER_SITE_OTHER): Payer: 59 | Admitting: *Deleted

## 2019-01-05 DIAGNOSIS — I495 Sick sinus syndrome: Secondary | ICD-10-CM | POA: Diagnosis not present

## 2019-01-06 ENCOUNTER — Telehealth: Payer: Self-pay

## 2019-01-06 NOTE — Telephone Encounter (Signed)
Left message for patient to remind of missed remote transmission.  

## 2019-01-07 LAB — CUP PACEART REMOTE DEVICE CHECK
Battery Remaining Longevity: 95 mo
Battery Voltage: 2.99 V
Brady Statistic AP VP Percent: 98.02 %
Brady Statistic AP VS Percent: 1.68 %
Brady Statistic AS VP Percent: 0.28 %
Brady Statistic AS VS Percent: 0.02 %
Brady Statistic RA Percent Paced: 99.7 %
Brady Statistic RV Percent Paced: 2.39 %
Date Time Interrogation Session: 20200522142451
Implantable Lead Implant Date: 20171023
Implantable Lead Implant Date: 20171023
Implantable Lead Implant Date: 20171023
Implantable Lead Location: 753858
Implantable Lead Location: 753859
Implantable Lead Location: 753860
Implantable Lead Model: 4298
Implantable Lead Model: 5076
Implantable Lead Model: 5076
Implantable Pulse Generator Implant Date: 20171023
Lead Channel Impedance Value: 285 Ohm
Lead Channel Impedance Value: 323 Ohm
Lead Channel Impedance Value: 323 Ohm
Lead Channel Impedance Value: 342 Ohm
Lead Channel Impedance Value: 361 Ohm
Lead Channel Impedance Value: 399 Ohm
Lead Channel Impedance Value: 418 Ohm
Lead Channel Impedance Value: 551 Ohm
Lead Channel Impedance Value: 551 Ohm
Lead Channel Impedance Value: 551 Ohm
Lead Channel Impedance Value: 551 Ohm
Lead Channel Impedance Value: 570 Ohm
Lead Channel Impedance Value: 836 Ohm
Lead Channel Impedance Value: 874 Ohm
Lead Channel Pacing Threshold Amplitude: 0.375 V
Lead Channel Pacing Threshold Amplitude: 0.625 V
Lead Channel Pacing Threshold Amplitude: 1.375 V
Lead Channel Pacing Threshold Pulse Width: 0.4 ms
Lead Channel Pacing Threshold Pulse Width: 0.4 ms
Lead Channel Pacing Threshold Pulse Width: 0.4 ms
Lead Channel Sensing Intrinsic Amplitude: 12.375 mV
Lead Channel Sensing Intrinsic Amplitude: 12.375 mV
Lead Channel Sensing Intrinsic Amplitude: 2 mV
Lead Channel Sensing Intrinsic Amplitude: 2 mV
Lead Channel Setting Pacing Amplitude: 2 V
Lead Channel Setting Pacing Amplitude: 2 V
Lead Channel Setting Pacing Amplitude: 2.5 V
Lead Channel Setting Pacing Pulse Width: 0.4 ms
Lead Channel Setting Pacing Pulse Width: 0.4 ms
Lead Channel Setting Sensing Sensitivity: 2 mV

## 2019-01-16 ENCOUNTER — Encounter: Payer: Self-pay | Admitting: Cardiology

## 2019-01-16 NOTE — Progress Notes (Signed)
Remote pacemaker transmission.   

## 2019-04-06 ENCOUNTER — Ambulatory Visit (INDEPENDENT_AMBULATORY_CARE_PROVIDER_SITE_OTHER): Payer: 59 | Admitting: *Deleted

## 2019-04-06 DIAGNOSIS — I495 Sick sinus syndrome: Secondary | ICD-10-CM | POA: Diagnosis not present

## 2019-04-06 DIAGNOSIS — I429 Cardiomyopathy, unspecified: Secondary | ICD-10-CM

## 2019-04-06 LAB — CUP PACEART REMOTE DEVICE CHECK
Battery Remaining Longevity: 93 mo
Battery Voltage: 2.98 V
Brady Statistic AP VP Percent: 97.81 %
Brady Statistic AP VS Percent: 1.69 %
Brady Statistic AS VP Percent: 0.47 %
Brady Statistic AS VS Percent: 0.03 %
Brady Statistic RA Percent Paced: 99.51 %
Brady Statistic RV Percent Paced: 12.05 %
Date Time Interrogation Session: 20200820120824
Implantable Lead Implant Date: 20171023
Implantable Lead Implant Date: 20171023
Implantable Lead Implant Date: 20171023
Implantable Lead Location: 753858
Implantable Lead Location: 753859
Implantable Lead Location: 753860
Implantable Lead Model: 4298
Implantable Lead Model: 5076
Implantable Lead Model: 5076
Implantable Pulse Generator Implant Date: 20171023
Lead Channel Impedance Value: 304 Ohm
Lead Channel Impedance Value: 342 Ohm
Lead Channel Impedance Value: 361 Ohm
Lead Channel Impedance Value: 361 Ohm
Lead Channel Impedance Value: 380 Ohm
Lead Channel Impedance Value: 399 Ohm
Lead Channel Impedance Value: 437 Ohm
Lead Channel Impedance Value: 570 Ohm
Lead Channel Impedance Value: 589 Ohm
Lead Channel Impedance Value: 589 Ohm
Lead Channel Impedance Value: 589 Ohm
Lead Channel Impedance Value: 627 Ohm
Lead Channel Impedance Value: 893 Ohm
Lead Channel Impedance Value: 931 Ohm
Lead Channel Pacing Threshold Amplitude: 0.5 V
Lead Channel Pacing Threshold Amplitude: 0.5 V
Lead Channel Pacing Threshold Amplitude: 1.5 V
Lead Channel Pacing Threshold Pulse Width: 0.4 ms
Lead Channel Pacing Threshold Pulse Width: 0.4 ms
Lead Channel Pacing Threshold Pulse Width: 0.4 ms
Lead Channel Sensing Intrinsic Amplitude: 13.125 mV
Lead Channel Sensing Intrinsic Amplitude: 2.125 mV
Lead Channel Setting Pacing Amplitude: 2 V
Lead Channel Setting Pacing Amplitude: 2 V
Lead Channel Setting Pacing Amplitude: 2.5 V
Lead Channel Setting Pacing Pulse Width: 0.4 ms
Lead Channel Setting Pacing Pulse Width: 0.4 ms
Lead Channel Setting Sensing Sensitivity: 2 mV

## 2019-04-14 NOTE — Progress Notes (Signed)
Remote pacemaker transmission.   

## 2019-07-06 ENCOUNTER — Ambulatory Visit (INDEPENDENT_AMBULATORY_CARE_PROVIDER_SITE_OTHER): Payer: 59 | Admitting: *Deleted

## 2019-07-06 DIAGNOSIS — I495 Sick sinus syndrome: Secondary | ICD-10-CM | POA: Diagnosis not present

## 2019-07-06 DIAGNOSIS — I4819 Other persistent atrial fibrillation: Secondary | ICD-10-CM

## 2019-07-06 LAB — CUP PACEART REMOTE DEVICE CHECK
Battery Remaining Longevity: 90 mo
Battery Voltage: 2.98 V
Brady Statistic AP VP Percent: 98.22 %
Brady Statistic AP VS Percent: 1.67 %
Brady Statistic AS VP Percent: 0.02 %
Brady Statistic AS VS Percent: 0.09 %
Brady Statistic RA Percent Paced: 99.97 %
Brady Statistic RV Percent Paced: 25.39 %
Date Time Interrogation Session: 20201118231545
Implantable Lead Implant Date: 20171023
Implantable Lead Implant Date: 20171023
Implantable Lead Implant Date: 20171023
Implantable Lead Location: 753858
Implantable Lead Location: 753859
Implantable Lead Location: 753860
Implantable Lead Model: 4298
Implantable Lead Model: 5076
Implantable Lead Model: 5076
Implantable Pulse Generator Implant Date: 20171023
Lead Channel Impedance Value: 1026 Ohm
Lead Channel Impedance Value: 304 Ohm
Lead Channel Impedance Value: 342 Ohm
Lead Channel Impedance Value: 361 Ohm
Lead Channel Impedance Value: 361 Ohm
Lead Channel Impedance Value: 399 Ohm
Lead Channel Impedance Value: 399 Ohm
Lead Channel Impedance Value: 456 Ohm
Lead Channel Impedance Value: 570 Ohm
Lead Channel Impedance Value: 589 Ohm
Lead Channel Impedance Value: 608 Ohm
Lead Channel Impedance Value: 608 Ohm
Lead Channel Impedance Value: 646 Ohm
Lead Channel Impedance Value: 988 Ohm
Lead Channel Pacing Threshold Amplitude: 0.5 V
Lead Channel Pacing Threshold Amplitude: 0.5 V
Lead Channel Pacing Threshold Amplitude: 1.5 V
Lead Channel Pacing Threshold Pulse Width: 0.4 ms
Lead Channel Pacing Threshold Pulse Width: 0.4 ms
Lead Channel Pacing Threshold Pulse Width: 0.4 ms
Lead Channel Sensing Intrinsic Amplitude: 14.125 mV
Lead Channel Sensing Intrinsic Amplitude: 14.125 mV
Lead Channel Sensing Intrinsic Amplitude: 2 mV
Lead Channel Sensing Intrinsic Amplitude: 2 mV
Lead Channel Setting Pacing Amplitude: 2 V
Lead Channel Setting Pacing Amplitude: 2 V
Lead Channel Setting Pacing Amplitude: 2.5 V
Lead Channel Setting Pacing Pulse Width: 0.4 ms
Lead Channel Setting Pacing Pulse Width: 0.4 ms
Lead Channel Setting Sensing Sensitivity: 2 mV

## 2019-07-25 ENCOUNTER — Other Ambulatory Visit: Payer: Self-pay | Admitting: Family Medicine

## 2019-07-28 ENCOUNTER — Other Ambulatory Visit: Payer: Self-pay | Admitting: Obstetrics and Gynecology

## 2019-07-28 DIAGNOSIS — Z1231 Encounter for screening mammogram for malignant neoplasm of breast: Secondary | ICD-10-CM

## 2019-08-04 NOTE — Progress Notes (Signed)
Remote pacemaker transmission.   

## 2019-09-14 ENCOUNTER — Ambulatory Visit: Payer: 59

## 2019-09-15 ENCOUNTER — Ambulatory Visit: Payer: 59

## 2019-10-05 ENCOUNTER — Ambulatory Visit (INDEPENDENT_AMBULATORY_CARE_PROVIDER_SITE_OTHER): Payer: 59 | Admitting: *Deleted

## 2019-10-05 DIAGNOSIS — I495 Sick sinus syndrome: Secondary | ICD-10-CM

## 2019-10-08 LAB — CUP PACEART REMOTE DEVICE CHECK
Battery Remaining Longevity: 89 mo
Battery Voltage: 2.98 V
Brady Statistic AP VP Percent: 98.09 %
Brady Statistic AP VS Percent: 1.68 %
Brady Statistic AS VP Percent: 0.1 %
Brady Statistic AS VS Percent: 0.13 %
Brady Statistic RA Percent Paced: 99.8 %
Brady Statistic RV Percent Paced: 25.38 %
Date Time Interrogation Session: 20210219112750
Implantable Lead Implant Date: 20171023
Implantable Lead Implant Date: 20171023
Implantable Lead Implant Date: 20171023
Implantable Lead Location: 753858
Implantable Lead Location: 753859
Implantable Lead Location: 753860
Implantable Lead Model: 4298
Implantable Lead Model: 5076
Implantable Lead Model: 5076
Implantable Pulse Generator Implant Date: 20171023
Lead Channel Impedance Value: 1007 Ohm
Lead Channel Impedance Value: 323 Ohm
Lead Channel Impedance Value: 342 Ohm
Lead Channel Impedance Value: 380 Ohm
Lead Channel Impedance Value: 380 Ohm
Lead Channel Impedance Value: 418 Ohm
Lead Channel Impedance Value: 437 Ohm
Lead Channel Impedance Value: 494 Ohm
Lead Channel Impedance Value: 589 Ohm
Lead Channel Impedance Value: 608 Ohm
Lead Channel Impedance Value: 627 Ohm
Lead Channel Impedance Value: 665 Ohm
Lead Channel Impedance Value: 684 Ohm
Lead Channel Impedance Value: 969 Ohm
Lead Channel Pacing Threshold Amplitude: 0.5 V
Lead Channel Pacing Threshold Amplitude: 0.5 V
Lead Channel Pacing Threshold Amplitude: 1.5 V
Lead Channel Pacing Threshold Pulse Width: 0.4 ms
Lead Channel Pacing Threshold Pulse Width: 0.4 ms
Lead Channel Pacing Threshold Pulse Width: 0.4 ms
Lead Channel Sensing Intrinsic Amplitude: 1.375 mV
Lead Channel Sensing Intrinsic Amplitude: 1.375 mV
Lead Channel Sensing Intrinsic Amplitude: 14.125 mV
Lead Channel Sensing Intrinsic Amplitude: 14.125 mV
Lead Channel Setting Pacing Amplitude: 2 V
Lead Channel Setting Pacing Amplitude: 2 V
Lead Channel Setting Pacing Amplitude: 2.5 V
Lead Channel Setting Pacing Pulse Width: 0.4 ms
Lead Channel Setting Pacing Pulse Width: 0.4 ms
Lead Channel Setting Sensing Sensitivity: 2 mV

## 2019-10-09 NOTE — Progress Notes (Signed)
PPM Remote  

## 2019-10-24 ENCOUNTER — Other Ambulatory Visit: Payer: Self-pay

## 2019-10-24 ENCOUNTER — Ambulatory Visit
Admission: RE | Admit: 2019-10-24 | Discharge: 2019-10-24 | Disposition: A | Discharge status: Home / Self Care | Payer: 59 | Source: Ambulatory Visit | Attending: Obstetrics and Gynecology | Admitting: Obstetrics and Gynecology

## 2019-10-24 DIAGNOSIS — Z1231 Encounter for screening mammogram for malignant neoplasm of breast: Secondary | ICD-10-CM

## 2019-11-20 ENCOUNTER — Other Ambulatory Visit: Payer: Self-pay | Admitting: Internal Medicine

## 2019-12-11 ENCOUNTER — Encounter: Payer: Self-pay | Admitting: Family Medicine

## 2019-12-11 ENCOUNTER — Telehealth (INDEPENDENT_AMBULATORY_CARE_PROVIDER_SITE_OTHER): Payer: 59 | Admitting: Family Medicine

## 2019-12-11 VITALS — HR 80 | Temp 98.0°F

## 2019-12-11 DIAGNOSIS — J988 Other specified respiratory disorders: Secondary | ICD-10-CM | POA: Diagnosis not present

## 2019-12-11 DIAGNOSIS — R05 Cough: Secondary | ICD-10-CM | POA: Diagnosis not present

## 2019-12-11 DIAGNOSIS — R059 Cough, unspecified: Secondary | ICD-10-CM

## 2019-12-11 MED ORDER — DOXYCYCLINE HYCLATE 100 MG PO TABS: 100.0000 mg | ORAL_TABLET | Freq: Two times a day (BID) | ORAL | 0 refills | Status: AC

## 2019-12-11 MED ORDER — BENZONATATE 100 MG PO CAPS: 100.0000 mg | ORAL_CAPSULE | Freq: Two times a day (BID) | ORAL | 0 refills | Status: AC | PRN

## 2019-12-11 MED ORDER — ALBUTEROL SULFATE HFA 108 (90 BASE) MCG/ACT IN AERS: 2.0000 | INHALATION_SPRAY | Freq: Four times a day (QID) | RESPIRATORY_TRACT | 0 refills | Status: DC | PRN

## 2019-12-11 MED ORDER — PREDNISONE 20 MG PO TABS: 40.0000 mg | ORAL_TABLET | Freq: Every day | ORAL | 0 refills | Status: AC

## 2019-12-11 NOTE — Progress Notes (Signed)
Virtual Visit via Video Note   I connected with Destiny Dennis on 12/11/19 by a video enabled telemedicine application and verified that I am speaking with the correct person using two identifiers.  Location patient: home Location provider:work office Persons participating in the virtual visit: patient, provider  I discussed the limitations of evaluation and management by telemedicine and the availability of in person appointments. The patient expressed understanding and agreed to proceed.  Chief Complaint  Patient presents with  . Allergies    cough, congestion, sinus pain in head started 13 days ago    HPI: Destiny Dennis is a 59 yo female with hx of allergies,anxiety,HTN,and atrial fib with above complaint. Negative for anosmia and ageusia. + Sinus pressure.  Negative for sick contact, including COVID-19 infected person.  Reporting episodes of "bronchitis" that happened every year around this time.   Productive cough,worse at night when in bed,interfering with sleep. + Mild nasal congestion, rhinorrhea,and postnasal drainage.  "Little" yellowish sputum in the morning. Negative for fever,chills,sore throat,CP,SOB,wheezing,abdominal pain,N/V,changes in bowel habits,or skin rash.  According to patient, oral antibiotic and a steroid has helped in the past.   She has also taking Tussionex for cough, she still has some relief from prior prescription.    Usually she takes allergy medications symptoms are not as bad, this year she started allergy medication "late." Negative for Hx of COPD or asthma.  She follows with immunologist. No hx of tobacco use.  Her son has Albuterol inh,which she used and felt better.  ROS: See pertinent positives and negatives per HPI.  Past Medical History:  Diagnosis Date  . Allergic rhinitis   . Anxiety    when tachycardia occurs  . Arthritis    OA, s/p numerous surgeries -back, knees, hip  . Benign fundic gland polyps of stomach   . Cervical disc  disease   . Diverticulitis    CT Scan   . DVT (deep venous thrombosis) ([place])    in pregnancy, s/p hip surgery  . Eustachian tube dysfunction   . Fatty liver   . Gallbladder problem   . GERD 12/23/2009   diet related-not a problem  . Hemorrhoids   . Hiatal hernia    small  . History of MRSA infection 2008   superficial skin-cleared easily with doxycycline  . Obesity   . Peripheral vascular disease ([place])    left leg after hip surgery  . Persistent atrial fibrillation ([place]) 03/05/2007   a. s/p RFCA 12/18/2011, 07/29/12  . s/p atrial septal defect repair 05/28/2016  . S/P Maze operation for atrial fibrillation 05/28/2016   Complete bilateral atrial lesion set using cryothermy and bipolar radiofrequency ablation with clipping of LA appendage via median sternotomy  . Sialoadenitis   . Tachy-brady syndrome [facility])     Past Surgical History:  Procedure Laterality Date  . ASD REPAIR N/A 05/28/2016   Procedure: ATRIAL SEPTAL DEFECT (ASD) closure;  Surgeon: Rexene Alberts, MD;  Location: [place];  Service: Open Heart Surgery;  Laterality: N/A;  . ATRIAL FIBRILLATION ABLATION N/A 12/18/2011   Procedure: ATRIAL FIBRILLATION ABLATION;  Surgeon: [name], MD;  Location: [facility] CATH LAB;  Service: Cardiovascular;  Laterality: N/A;  . ATRIAL FIBRILLATION ABLATION N/A 07/29/2012   Procedure: ATRIAL FIBRILLATION ABLATION;  Surgeon: [name], MD;  Location: [facility] CATH LAB;  Service: Cardiovascular;  Laterality: N/A;  . CARDIAC CATHETERIZATION N/A 04/10/2016   Procedure: Right/Left Heart Cath and Coronary Angiography;  Surgeon: Jettie Booze, MD;  Location: [place]  CV LAB;  Service: Cardiovascular;  Laterality: N/A;  . CERVICAL FUSION    . CESAREAN SECTION     x 2  . CHOLECYSTECTOMY    . CLIPPING OF ATRIAL APPENDAGE  05/28/2016   Procedure: CLIPPING OF ATRIAL APPENDAGE;  Surgeon: Rexene Alberts, MD;  Location: [place];  Service: Open Heart Surgery;;  . COLONOSCOPY  07/16/2011   diverticulosis   . ELBOW SURGERY     Right  . EP IMPLANTABLE DEVICE N/A 09/10/2015   Procedure: Loop Recorder Insertion;  Surgeon: [name], MD;  Location: [place] CV LAB;  Service: Cardiovascular;  Laterality: N/A;  . EP IMPLANTABLE DEVICE N/A 06/08/2016   MDT Marcelino Scot CRT-P implanted by Dr Curt Bears  . FLEXIBLE SIGMOIDOSCOPY     1990's  . FOOT SURGERY     Right   . I & D KNEE WITH POLY EXCHANGE Left 12/12/2012   Procedure: LEFT KNEE EXCISION SAPHENOUS NEUROMA/OPEN SCAR DEBRIDEMENT/POLY EXCHANGE/NERVE EXCISION;  Surgeon: Mauri Pole, MD;  [facility];  Service: Orthopedics;  Laterality: Left;  . JOINT REPLACEMENT Bilateral    4'14  knees  . KNEE SURGERY     x 9 Left Knee  . MAZE  05/28/2016   Procedure: MAZE;  Surgeon: Rexene Alberts, MD;  Location: [place];  Service: Open Heart Surgery;;  . MEDIASTERNOTOMY N/A 05/28/2016   Procedure: MEDIAN STERNOTOMY;  Surgeon: Rexene Alberts, MD;  Location: [place];  Service: Open Heart Surgery;  Laterality: N/A;  . REDUCTION MAMMAPLASTY Bilateral   . SHOULDER SURGERY     Right   . TEE WITHOUT CARDIOVERSION  12/17/2011   Procedure: TRANSESOPHAGEAL ECHOCARDIOGRAM (TEE);  Surgeon: Larey Dresser, MD;  Location: [facility] ENDOSCOPY;  Service: Cardiovascular;  Laterality: N/A;  . TEE WITHOUT CARDIOVERSION  07/28/2012   Procedure: TRANSESOPHAGEAL ECHOCARDIOGRAM (TEE);  Surgeon: Thayer Headings, MD;  Location: [place];  Service: Cardiovascular;  Laterality: N/A;  . TEE WITHOUT CARDIOVERSION N/A 01/01/2016   Procedure: TRANSESOPHAGEAL ECHOCARDIOGRAM (TEE);  Surgeon: Sanda Klein, MD;  Location: [facility] ENDOSCOPY;  Service: Cardiovascular;  Laterality: N/A;  . TEE WITHOUT CARDIOVERSION N/A 05/28/2016   Procedure: TRANSESOPHAGEAL ECHOCARDIOGRAM (TEE);  Surgeon: Rexene Alberts, MD;  Location: [place];  Service: Open Heart Surgery;  Laterality: N/A;  . TOTAL HIP ARTHROPLASTY Left 10/17/2013   Procedure: LEFT TOTAL HIP ARTHROPLASTY ANTERIOR APPROACH;  Surgeon: Mauri Pole, MD;  [facility];  Service: Orthopedics;  Laterality: Left;  . TOTAL KNEE ARTHROPLASTY Right 12/12/2012   Procedure: RIGHT TOTAL KNEE ARTHROPLASTY;  Surgeon: Mauri Pole, MD;  [facility];  Service: Orthopedics;  Laterality: Right;  . TUBAL LIGATION    . UPPER GASTROINTESTINAL ENDOSCOPY  01/30/2010   hiatal hernia, fundic gland polyps  . WRIST SURGERY     Left     Family History  Problem Relation Age of Onset  . Diabetes Mother   . Hyperlipidemia Mother   . Liver disease Father   . Obesity Father   . Diabetes Maternal Grandfather   . Diverticulitis Brother        x 2  . Diverticulitis Paternal Grandmother   . Colon cancer Neg Hx   . Breast cancer Neg Hx     Social History   Socioeconomic History  . Marital status: Married    Spouse name: Not on file  . Number of children: 2  . Years of education: Not on file  . Highest education level: Not on file  Occupational  History  . Occupation: Medical case Freight forwarder    Comment: Workers Comp  Tobacco Use  . Smoking status: Never Smoker  . Smokeless tobacco: Never Used  Substance and Sexual Activity  . Alcohol use: Yes    Comment: rare social  . Drug use: No  . Sexual activity: Yes    Comment: has periods q 3-4 months- no possiblity pregnant per pt.07-16-11  Other Topics Concern  . Not on file  Social History Narrative   Updated 06/2015   Daily Caffeine    Pt lives in [place] with spouse and two children 39 and 84 yo in 2-16.  She works as a workers Dance movement psychotherapist.   Christian   No regular exercise, diet ok   Social Determinants of Health   Financial Resource Strain:   . Difficulty of Paying Living Expenses:   Food Insecurity:   . Worried About Charity fundraiser in the Last Year:   . Arboriculturist in the Last Year:   Transportation Needs:   . Film/video editor (Medical):   Marland Kitchen Lack of Transportation (Non-Medical):   Physical Activity:   . Days of Exercise per Week:   . Minutes of  Exercise per Session:   Stress:   . Feeling of Stress :   Social Connections:   . Frequency of Communication with Friends and Family:   . Frequency of Social Gatherings with Friends and Family:   . Attends Religious Services:   . Active Member of Clubs or Organizations:   . Attends Archivist Meetings:   Marland Kitchen Marital Status:   Intimate Partner Violence:   . Fear of Current or Ex-Partner:   . Emotionally Abused:   Marland Kitchen Physically Abused:   . Sexually Abused:     Current Outpatient Medications:  .  acetaminophen (TYLENOL) 325 MG tablet, Take 1-2 tablets (325-650 mg total) by mouth every 6 (six) hours as needed for mild pain or headache (For pain.)., Disp: , Rfl:  .  amoxicillin (AMOXIL) 500 MG capsule, TAKE 4 CAPSULES 1 HOUR BEFORE DENTAL TREATMENT, Disp: , Rfl:  .  chlorpheniramine-HYDROcodone (TUSSIONEX PENNKINETIC ER) 10-8 MG/5ML SUER, Take 5 mLs by mouth 2 (two) times daily., Disp: 50 mL, Rfl: 0 .  Cholecalciferol 1.25 MG (50000 UT) capsule, cholecalciferol (vitamin D3) 1,250 mcg (50,000 unit) capsule  TAKE ONE CAPSULE ONCE A WEEK FOR 8 WEEKS, Disp: , Rfl:  .  ibuprofen (ADVIL) 800 MG tablet, ibuprofen 800 mg tablet  Take 1 tablet every day by oral route at bedtime., Disp: , Rfl:  .  influenza vaccine (FLUCELVAX QUADRIVALENT) 0.5 ML injection, Flucelvax Quad 2019-2020 (PF) 60 mcg (15 mcg x 4)/0.5 mL IM syringe  TO BE ADMINISTERED BY PHARMACIST FOR IMMUNIZATION, Disp: , Rfl:  .  LOTEMAX 0.5 % GEL, Apply 1 drop to eye 2 (two) times daily., Disp: , Rfl:  .  metoprolol succinate (TOPROL-XL) 100 MG 24 hr tablet, Take 1 tablet (100 mg total) by mouth daily. Please call and make overdue appt for further refills [phone]., Disp: 30 tablet, Rfl: 0 .  Multiple Vitamin (MULTIVITAMIN) capsule, Take 1 capsule by mouth daily., Disp: , Rfl:  .  Omega-3 Fatty Acids (FISH OIL) 1000 MG CAPS, Take by mouth., Disp: , Rfl:  .  pantoprazole (PROTONIX) 40 MG tablet, TAKE 1 TABLET BY MOUTH EVERY DAY AS  NEEDED FOR HEARTBURN *PATIENT NEEDS APPT*, Disp: 30 tablet, Rfl: 0 .  tiZANidine (ZANAFLEX) 2 MG tablet, Take 1 tablet (2 mg  total) by mouth 2 (two) times daily as needed for muscle spasms., Disp: 30 tablet, Rfl: 0 .  traMADol (ULTRAM) 50 MG tablet, Take 50 mg by mouth every 6 (six) hours., Disp: , Rfl:  .  valACYclovir (VALTREX) 1000 MG tablet, valacyclovir 1 gram tablet, Disp: , Rfl:  .  albuterol (VENTOLIN HFA) 108 (90 Base) MCG/ACT inhaler, Inhale 2 puffs into the lungs every 6 (six) hours as needed for wheezing or shortness of breath., Disp: 18 g, Rfl: 0 .  benzonatate (TESSALON) 100 MG capsule, Take 1-2 capsules (100-200 mg total) by mouth 2 (two) times daily as needed for up to 10 days for cough., Disp: 30 capsule, Rfl: 0 .  doxycycline (VIBRA-TABS) 100 MG tablet, Take 1 tablet (100 mg total) by mouth 2 (two) times daily for 7 days., Disp: 14 tablet, Rfl: 0 .  predniSONE (DELTASONE) 20 MG tablet, Take 2 tablets (40 mg total) by mouth daily with breakfast for 3 days., Disp: 6 tablet, Rfl: 0  EXAM:  VITALS per patient if applicable:Pulse 80   Temp 98 F (36.7 C) (Temporal)   LMP 04/17/2012   SpO2 98%   GENERAL: alert, oriented, appears well and in no acute distress  HEENT: atraumatic, conjunctiva clear, no obvious abnormalities on inspection of external nose and ears  NECK: normal movements of the head and neck  LUNGS: on inspection no signs of respiratory distress, breathing rate appears normal, no obvious gross SOB, gasping or wheezing  CV: no obvious cyanosis  Destiny: moves all visible extremities without noticeable abnormality  PSYCH/NEURO: pleasant and cooperative, no obvious depression or anxiety, speech and thought processing grossly intact  ASSESSMENT AND PLAN:  Discussed the following assessment and plan:  Respiratory tract infection - Plan: doxycycline (VIBRA-TABS) 100 MG tablet, albuterol (VENTOLIN HFA) 108 (90 Base) MCG/ACT inhaler Allergies could also be contributing  factors. Recommend to hold on Prednisone for now, start is not improvement with Albuterol inh or if wheezing.  Some side effects discussed, if she takes it instructed to do so with breakfast. Instructed about warning signs. I sent Rx for Doxycycline 100 mg bid x 7 days. Explained that  It will not help if viral or allergic etiology.  Cough - Plan: benzonatate (TESSALON) 100 MG capsule Explained that cough can last a few more weeks after recovery. I do not think imaging is needed at this time.  She has Tussionex cough syrup from last 11/2018, she can take it at bedtime. Caution advised , she is on Tra,adol for ankle pain. Benzonatate may also help.   I discussed the assessment and treatment plan with the patient. Destiny Dennis was provided an opportunity to ask questions and all were answered. She agreed with the plan and demonstrated an understanding of the instructions.    Return if symptoms worsen or fail to improve.   Radie Berges [place], MD

## 2019-12-17 ENCOUNTER — Other Ambulatory Visit: Payer: Self-pay | Admitting: Internal Medicine

## 2020-01-04 ENCOUNTER — Ambulatory Visit (INDEPENDENT_AMBULATORY_CARE_PROVIDER_SITE_OTHER): Payer: 59 | Admitting: *Deleted

## 2020-01-04 DIAGNOSIS — I4891 Unspecified atrial fibrillation: Secondary | ICD-10-CM | POA: Diagnosis not present

## 2020-01-04 LAB — CUP PACEART REMOTE DEVICE CHECK
Battery Remaining Longevity: 84 mo
Battery Voltage: 2.98 V
Brady Statistic AP VP Percent: 98.25 %
Brady Statistic AP VS Percent: 1.68 %
Brady Statistic AS VP Percent: 0.02 %
Brady Statistic AS VS Percent: 0.05 %
Brady Statistic RA Percent Paced: 99.96 %
Brady Statistic RV Percent Paced: 29.45 %
Date Time Interrogation Session: 20210520160128
Implantable Lead Implant Date: 20171023
Implantable Lead Implant Date: 20171023
Implantable Lead Implant Date: 20171023
Implantable Lead Location: 753858
Implantable Lead Location: 753859
Implantable Lead Location: 753860
Implantable Lead Model: 4298
Implantable Lead Model: 5076
Implantable Lead Model: 5076
Implantable Pulse Generator Implant Date: 20171023
Lead Channel Impedance Value: 285 Ohm
Lead Channel Impedance Value: 342 Ohm
Lead Channel Impedance Value: 342 Ohm
Lead Channel Impedance Value: 361 Ohm
Lead Channel Impedance Value: 361 Ohm
Lead Channel Impedance Value: 380 Ohm
Lead Channel Impedance Value: 437 Ohm
Lead Channel Impedance Value: 570 Ohm
Lead Channel Impedance Value: 589 Ohm
Lead Channel Impedance Value: 589 Ohm
Lead Channel Impedance Value: 589 Ohm
Lead Channel Impedance Value: 627 Ohm
Lead Channel Impedance Value: 874 Ohm
Lead Channel Impedance Value: 912 Ohm
Lead Channel Pacing Threshold Amplitude: 0.5 V
Lead Channel Pacing Threshold Amplitude: 0.5 V
Lead Channel Pacing Threshold Amplitude: 1.25 V
Lead Channel Pacing Threshold Pulse Width: 0.4 ms
Lead Channel Pacing Threshold Pulse Width: 0.4 ms
Lead Channel Pacing Threshold Pulse Width: 0.4 ms
Lead Channel Sensing Intrinsic Amplitude: 1.875 mV
Lead Channel Sensing Intrinsic Amplitude: 1.875 mV
Lead Channel Sensing Intrinsic Amplitude: 15.25 mV
Lead Channel Sensing Intrinsic Amplitude: 15.25 mV
Lead Channel Setting Pacing Amplitude: 1.75 V
Lead Channel Setting Pacing Amplitude: 2 V
Lead Channel Setting Pacing Amplitude: 2.5 V
Lead Channel Setting Pacing Pulse Width: 0.4 ms
Lead Channel Setting Pacing Pulse Width: 0.4 ms
Lead Channel Setting Sensing Sensitivity: 2 mV

## 2020-01-08 ENCOUNTER — Telehealth: Payer: Self-pay | Admitting: Internal Medicine

## 2020-01-08 NOTE — Telephone Encounter (Signed)
   Went to chart to check med list, pt said she needs to schedule DR. Allred for med refill. Transferred to [facility]

## 2020-01-08 NOTE — Progress Notes (Signed)
Remote pacemaker transmission.   

## 2020-01-12 ENCOUNTER — Other Ambulatory Visit: Payer: Self-pay | Admitting: Internal Medicine

## 2020-01-23 ENCOUNTER — Telehealth: Payer: Self-pay

## 2020-01-23 NOTE — Telephone Encounter (Signed)
Spoke with pt regarding appt on 01/24/20. Pt stated she will be working at the same time as the appt and need to reschedule. Pt was informed that her appt would be cancelled and Doylene Canning will call to reschedule.

## 2020-01-24 ENCOUNTER — Telehealth: Payer: 59 | Admitting: Internal Medicine

## 2020-02-21 ENCOUNTER — Encounter: Payer: 59 | Admitting: Internal Medicine

## 2020-03-20 ENCOUNTER — Encounter: Payer: 59 | Admitting: Internal Medicine

## 2020-04-04 ENCOUNTER — Ambulatory Visit (INDEPENDENT_AMBULATORY_CARE_PROVIDER_SITE_OTHER): Payer: 59 | Admitting: *Deleted

## 2020-04-04 DIAGNOSIS — I4819 Other persistent atrial fibrillation: Secondary | ICD-10-CM | POA: Diagnosis not present

## 2020-04-05 LAB — CUP PACEART REMOTE DEVICE CHECK
Battery Remaining Longevity: 77 mo
Battery Voltage: 2.97 V
Brady Statistic AP VP Percent: 98.25 %
Brady Statistic AP VS Percent: 1.67 %
Brady Statistic AS VP Percent: 0.04 %
Brady Statistic AS VS Percent: 0.03 %
Brady Statistic RA Percent Paced: 99.94 %
Brady Statistic RV Percent Paced: 43.26 %
Date Time Interrogation Session: 20210820115933
Implantable Lead Implant Date: 20171023
Implantable Lead Implant Date: 20171023
Implantable Lead Implant Date: 20171023
Implantable Lead Location: 753858
Implantable Lead Location: 753859
Implantable Lead Location: 753860
Implantable Lead Model: 4298
Implantable Lead Model: 5076
Implantable Lead Model: 5076
Implantable Pulse Generator Implant Date: 20171023
Lead Channel Impedance Value: 304 Ohm
Lead Channel Impedance Value: 342 Ohm
Lead Channel Impedance Value: 342 Ohm
Lead Channel Impedance Value: 361 Ohm
Lead Channel Impedance Value: 380 Ohm
Lead Channel Impedance Value: 380 Ohm
Lead Channel Impedance Value: 456 Ohm
Lead Channel Impedance Value: 589 Ohm
Lead Channel Impedance Value: 589 Ohm
Lead Channel Impedance Value: 589 Ohm
Lead Channel Impedance Value: 589 Ohm
Lead Channel Impedance Value: 608 Ohm
Lead Channel Impedance Value: 874 Ohm
Lead Channel Impedance Value: 912 Ohm
Lead Channel Pacing Threshold Amplitude: 0.5 V
Lead Channel Pacing Threshold Amplitude: 0.5 V
Lead Channel Pacing Threshold Amplitude: 1.375 V
Lead Channel Pacing Threshold Pulse Width: 0.4 ms
Lead Channel Pacing Threshold Pulse Width: 0.4 ms
Lead Channel Pacing Threshold Pulse Width: 0.4 ms
Lead Channel Sensing Intrinsic Amplitude: 1.625 mV
Lead Channel Sensing Intrinsic Amplitude: 1.625 mV
Lead Channel Sensing Intrinsic Amplitude: 13.625 mV
Lead Channel Sensing Intrinsic Amplitude: 13.625 mV
Lead Channel Setting Pacing Amplitude: 2 V
Lead Channel Setting Pacing Amplitude: 2 V
Lead Channel Setting Pacing Amplitude: 2.5 V
Lead Channel Setting Pacing Pulse Width: 0.4 ms
Lead Channel Setting Pacing Pulse Width: 0.4 ms
Lead Channel Setting Sensing Sensitivity: 2 mV

## 2020-04-08 NOTE — Progress Notes (Signed)
Remote pacemaker transmission.   

## 2020-04-12 ENCOUNTER — Other Ambulatory Visit: Payer: Self-pay

## 2020-04-12 ENCOUNTER — Encounter: Payer: Self-pay | Admitting: *Deleted

## 2020-04-12 ENCOUNTER — Ambulatory Visit (INDEPENDENT_AMBULATORY_CARE_PROVIDER_SITE_OTHER): Payer: 59 | Admitting: Internal Medicine

## 2020-04-12 ENCOUNTER — Encounter: Payer: Self-pay | Admitting: Internal Medicine

## 2020-04-12 VITALS — BP 110/78 | HR 67 | Ht 71.0 in | Wt 245.0 lb

## 2020-04-12 DIAGNOSIS — I428 Other cardiomyopathies: Secondary | ICD-10-CM

## 2020-04-12 DIAGNOSIS — I495 Sick sinus syndrome: Secondary | ICD-10-CM

## 2020-04-12 DIAGNOSIS — I4819 Other persistent atrial fibrillation: Secondary | ICD-10-CM

## 2020-04-12 LAB — CUP PACEART INCLINIC DEVICE CHECK
Battery Remaining Longevity: 80 mo
Battery Voltage: 2.97 V
Brady Statistic AP VP Percent: 98.19 %
Brady Statistic AP VS Percent: 1.67 %
Brady Statistic AS VP Percent: 0.09 %
Brady Statistic AS VS Percent: 0.04 %
Brady Statistic RA Percent Paced: 99.88 %
Brady Statistic RV Percent Paced: 16.22 %
Date Time Interrogation Session: 20210827160906
Implantable Lead Implant Date: 20171023
Implantable Lead Implant Date: 20171023
Implantable Lead Implant Date: 20171023
Implantable Lead Location: 753858
Implantable Lead Location: 753859
Implantable Lead Location: 753860
Implantable Lead Model: 4298
Implantable Lead Model: 5076
Implantable Lead Model: 5076
Implantable Pulse Generator Implant Date: 20171023
Lead Channel Impedance Value: 1064 Ohm
Lead Channel Impedance Value: 1121 Ohm
Lead Channel Impedance Value: 323 Ohm
Lead Channel Impedance Value: 361 Ohm
Lead Channel Impedance Value: 380 Ohm
Lead Channel Impedance Value: 399 Ohm
Lead Channel Impedance Value: 399 Ohm
Lead Channel Impedance Value: 437 Ohm
Lead Channel Impedance Value: 532 Ohm
Lead Channel Impedance Value: 589 Ohm
Lead Channel Impedance Value: 627 Ohm
Lead Channel Impedance Value: 627 Ohm
Lead Channel Impedance Value: 627 Ohm
Lead Channel Impedance Value: 646 Ohm
Lead Channel Pacing Threshold Amplitude: 0.5 V
Lead Channel Pacing Threshold Amplitude: 0.5 V
Lead Channel Pacing Threshold Amplitude: 1.375 V
Lead Channel Pacing Threshold Pulse Width: 0.4 ms
Lead Channel Pacing Threshold Pulse Width: 0.4 ms
Lead Channel Pacing Threshold Pulse Width: 0.4 ms
Lead Channel Sensing Intrinsic Amplitude: 1.375 mV
Lead Channel Sensing Intrinsic Amplitude: 11.75 mV
Lead Channel Setting Pacing Amplitude: 2 V
Lead Channel Setting Pacing Amplitude: 2 V
Lead Channel Setting Pacing Amplitude: 2.5 V
Lead Channel Setting Pacing Pulse Width: 0.4 ms
Lead Channel Setting Pacing Pulse Width: 0.4 ms
Lead Channel Setting Sensing Sensitivity: 2 mV

## 2020-04-12 NOTE — Patient Instructions (Addendum)
Medication Instructions:  Your physician recommends that you continue on your current medications as directed. Please refer to the Current Medication list given to you today.  *If you need a refill on your cardiac medications before your next appointment, please call your pharmacy*  Lab Work: None ordered.  If you have labs (blood work) drawn today and your tests are completely normal, you will receive your results only by: Marland Kitchen MyChart Message (if you have MyChart) OR . A paper copy in the mail If you have any lab test that is abnormal or we need to change your treatment, we will call you to review the results.  Testing/Procedures: Your physician has requested that you have an echocardiogram. Echocardiography is a painless test that uses sound waves to create images of your heart. It provides your doctor with information about the size and shape of your heart and how well your heart's chambers and valves are working. This procedure takes approximately one hour. There are no restrictions for this procedure.   Follow-Up: At [facility], you and your health needs are our priority.  As part of our continuing mission to provide you with exceptional heart care, we have created designated Provider Care Teams.  These Care Teams include your primary Cardiologist (physician) and Advanced Practice Providers (APPs -  Physician Assistants and Nurse Practitioners) who all work together to provide you with the care you need, when you need it.  We recommend signing up for the patient portal called "MyChart".  Sign up information is provided on this After Visit Summary.  MyChart is used to connect with patients for Virtual Visits (Telemedicine).  Patients are able to view lab/test results, encounter notes, upcoming appointments, etc.  Non-urgent messages can be sent to your provider as well.   To learn more about what you can do with MyChart, go to [email or web address].    Your next appointment:   Your  physician wants you to follow-up in: 1 year with Dr. Vallery Ridge will receive a reminder letter in the mail two months in advance. If you don't receive a letter, please call our office to schedule the follow-up appointment.  Remote monitoring is used to monitor your Pacemaker from home. This monitoring reduces the number of office visits required to check your device to one time per year. It allows [place] to keep an eye on the functioning of your device to ensure it is working properly. You are scheduled for a device check from home on 07/04/2020. You may send your transmission at any time that day. If you have a wireless device, the transmission will be sent automatically. After your physician reviews your transmission, you will receive a postcard with your next transmission date.  Other Instructions:

## 2020-04-12 NOTE — Progress Notes (Signed)
PCP: Lucretia Kern, DO   [name] is a 59 y.o. female who presents today for routine electrophysiology followup.  Since last being seen in our clinic, the patient reports doing very well.  Today, she denies symptoms of palpitations, chest pain, shortness of breath,  lower extremity edema, dizziness, presyncope, or syncope.  The patient is otherwise without complaint today.   Past Medical History:  Diagnosis Date   Allergic rhinitis    Anxiety    when tachycardia occurs   Arthritis    OA, s/p numerous surgeries -back, knees, hip   Benign fundic gland polyps of stomach    Cervical disc disease    Diverticulitis    CT Scan    DVT (deep venous thrombosis) (HCC)    in pregnancy, s/p hip surgery   Eustachian tube dysfunction    Fatty liver    Gallbladder problem    GERD 12/23/2009   diet related-not a problem   Hemorrhoids    Hiatal hernia    small   History of MRSA infection 2008   superficial skin-cleared easily with doxycycline   Obesity    Peripheral vascular disease ([place])    left leg after hip surgery   Persistent atrial fibrillation ([place]) 03/05/2007   a. s/p RFCA 12/18/2011, 07/29/12   s/p atrial septal defect repair 05/28/2016   S/P Maze operation for atrial fibrillation 05/28/2016   Complete bilateral atrial lesion set using cryothermy and bipolar radiofrequency ablation with clipping of LA appendage via median sternotomy   Sialoadenitis    Tachy-brady syndrome ([place])    Past Surgical History:  Procedure Laterality Date   ASD REPAIR N/A 05/28/2016   Procedure: ATRIAL SEPTAL DEFECT (ASD) closure;  Surgeon: Rexene Alberts, MD;  Location: [place];  Service: Open Heart Surgery;  Laterality: N/A;   ATRIAL FIBRILLATION ABLATION N/A 12/18/2011   Procedure: ATRIAL FIBRILLATION ABLATION;  Surgeon: [name], MD;  Location: [facility] CATH LAB;  Service: Cardiovascular;  Laterality: N/A;   ATRIAL FIBRILLATION ABLATION N/A  07/29/2012   Procedure: ATRIAL FIBRILLATION ABLATION;  Surgeon: [name], MD;  Location: [facility] CATH LAB;  Service: Cardiovascular;  Laterality: N/A;   CARDIAC CATHETERIZATION N/A 04/10/2016   Procedure: Right/Left Heart Cath and Coronary Angiography;  Surgeon: Jettie Booze, MD;  Location: [place] CV LAB;  Service: Cardiovascular;  Laterality: N/A;   CERVICAL FUSION     CESAREAN SECTION     x 2   CHOLECYSTECTOMY     CLIPPING OF ATRIAL APPENDAGE  05/28/2016   Procedure: CLIPPING OF ATRIAL APPENDAGE;  Surgeon: Rexene Alberts, MD;  Location: [place];  Service: Open Heart Surgery;;   COLONOSCOPY  07/16/2011   diverticulosis   ELBOW SURGERY     Right   EP IMPLANTABLE DEVICE N/A 09/10/2015   Procedure: Loop Recorder Insertion;  Surgeon: [name], MD;  Location: [place] CV LAB;  Service: Cardiovascular;  Laterality: N/A;   EP IMPLANTABLE DEVICE N/A 06/08/2016   MDT Marcelino Scot CRT-P implanted by Dr Curt Bears   FLEXIBLE SIGMOIDOSCOPY     [phone]   [place] Left 12/12/2012   Procedure: LEFT KNEE EXCISION SAPHENOUS NEUROMA/OPEN SCAR DEBRIDEMENT/POLY EXCHANGE/NERVE EXCISION;  Surgeon: Mauri Pole, MD;  Location: [facility];  Service: Orthopedics;  Laterality: Left;   JOINT REPLACEMENT Bilateral    4'14  knees   KNEE SURGERY  x 9 Left Knee   MAZE  05/28/2016   Procedure: MAZE;  Surgeon: Rexene Alberts, MD;  Location: [place];  Service: Open Heart Surgery;;   MEDIASTERNOTOMY N/A 05/28/2016   Procedure: MEDIAN STERNOTOMY;  Surgeon: Rexene Alberts, MD;  Location: [place];  Service: Open Heart Surgery;  Laterality: N/A;   REDUCTION MAMMAPLASTY Bilateral    SHOULDER SURGERY     Right    TEE WITHOUT CARDIOVERSION  12/17/2011   Procedure: TRANSESOPHAGEAL ECHOCARDIOGRAM (TEE);  Surgeon: Larey Dresser, MD;  Location: [facility] ENDOSCOPY;  Service: Cardiovascular;  Laterality: N/A;   TEE WITHOUT CARDIOVERSION  07/28/2012    Procedure: TRANSESOPHAGEAL ECHOCARDIOGRAM (TEE);  Surgeon: Thayer Headings, MD;  Location: [place];  Service: Cardiovascular;  Laterality: N/A;   TEE WITHOUT CARDIOVERSION N/A 01/01/2016   Procedure: TRANSESOPHAGEAL ECHOCARDIOGRAM (TEE);  Surgeon: Sanda Klein, MD;  Location: [facility] ENDOSCOPY;  Service: Cardiovascular;  Laterality: N/A;   TEE WITHOUT CARDIOVERSION N/A 05/28/2016   Procedure: TRANSESOPHAGEAL ECHOCARDIOGRAM (TEE);  Surgeon: Rexene Alberts, MD;  Location: [place];  Service: Open Heart Surgery;  Laterality: N/A;   TOTAL HIP ARTHROPLASTY Left 10/17/2013   Procedure: LEFT TOTAL HIP ARTHROPLASTY ANTERIOR APPROACH;  Surgeon: Mauri Pole, MD;  [facility];  Service: Orthopedics;  Laterality: Left;   TOTAL KNEE ARTHROPLASTY Right 12/12/2012   Procedure: RIGHT TOTAL KNEE ARTHROPLASTY;  Surgeon: Mauri Pole, MD;  [facility];  Service: Orthopedics;  Laterality: Right;   TUBAL LIGATION     UPPER GASTROINTESTINAL ENDOSCOPY  01/30/2010   hiatal hernia, fundic gland polyps   WRIST SURGERY     Left     ROS- all systems are reviewed and negative except as per HPI above  Current Outpatient Medications  Medication Sig Dispense Refill   acetaminophen (TYLENOL) 325 MG tablet Take 1-2 tablets (325-650 mg total) by mouth every 6 (six) hours as needed for mild pain or headache (For pain.).     amoxicillin (AMOXIL) 500 MG capsule TAKE 4 CAPSULES 1 HOUR BEFORE DENTAL TREATMENT     ibuprofen (ADVIL) 800 MG tablet ibuprofen 800 mg tablet  Take 1 tablet every day by oral route at bedtime.     influenza vaccine (FLUCELVAX QUADRIVALENT) 0.5 ML injection Flucelvax Quad 2019-2020 (PF) 60 mcg (15 mcg x 4)/0.5 mL IM syringe  TO BE ADMINISTERED BY PHARMACIST FOR IMMUNIZATION     metoprolol succinate (TOPROL-XL) 100 MG 24 hr tablet Take 1 tablet (100 mg total) by mouth daily. 90 tablet 0   Multiple Vitamin (MULTIVITAMIN) capsule Take 1 capsule by mouth daily.     Omega-3 Fatty Acids  (FISH OIL) 1000 MG CAPS Take by mouth.     No current facility-administered medications for this visit.    Physical Exam: Vitals:   04/12/20 1420  BP: 110/78  Pulse: 67  SpO2: 96%  Weight: 245 lb (111.1 kg)  Height: 5\' 11"  (1.803 m)    GEN- The patient is well appearing, alert and oriented x 3 today.   Head- normocephalic, atraumatic Eyes-  Sclera clear, conjunctiva pink Ears- hearing intact Oropharynx- clear Lungs- Clear to ausculation bilaterally, normal work of breathing Chest- pacemaker pocket is well healed Heart- Regular rate and rhythm, no murmurs, rubs or gallops, PMI not laterally displaced GI- soft, NT, ND, + BS Extremities- no clubbing, cyanosis, or edema  Pacemaker interrogation- reviewed in detail today,  See PACEART report  ekg tracing ordered today is personally reviewed and shows a paced, BiV paced  Assessment  and Plan:  1. Symptomatic sinus bradycardia  Normal BiV pacemaker function See Pace Art report No changes today she is not device dependant today  2. Persistent afib Well controlled post MAZE with LAA clip by Dr Roxy Manns (burden by PPM <0.1%) chads2vasc is 1.  She does not require anticoagulation We discussed that she could reduce toprol.  She has rare palpitations and does not wish to change at this tim  3. Nonischemic CM EF normalized with sinus and also BiV pacing. Repeat echo to reassess  4. Overweight Body mass index is 34.17 kg/m. Lifestyle modification is advised  Risks, benefits and potential toxicities for medications prescribed and/or refilled reviewed with patient today.   Return in a year  [name] MD, [facility] 04/12/2020 2:28 PM

## 2020-04-14 ENCOUNTER — Other Ambulatory Visit: Payer: Self-pay | Admitting: Internal Medicine

## 2020-05-10 ENCOUNTER — Other Ambulatory Visit (HOSPITAL_COMMUNITY): Payer: 59

## 2020-05-25 ENCOUNTER — Other Ambulatory Visit: Payer: Self-pay

## 2020-05-25 ENCOUNTER — Encounter: Payer: Self-pay | Admitting: Family Medicine

## 2020-05-25 ENCOUNTER — Telehealth (INDEPENDENT_AMBULATORY_CARE_PROVIDER_SITE_OTHER): Payer: 59 | Admitting: Family Medicine

## 2020-05-25 VITALS — Temp 97.8°F

## 2020-05-25 DIAGNOSIS — B349 Viral infection, unspecified: Secondary | ICD-10-CM

## 2020-05-25 DIAGNOSIS — U071 COVID-19: Secondary | ICD-10-CM | POA: Diagnosis not present

## 2020-05-25 MED ORDER — HYDROCODONE-HOMATROPINE 5-1.5 MG/5ML PO SYRP: ORAL_SOLUTION | ORAL | 0 refills | Status: DC

## 2020-05-25 NOTE — Progress Notes (Signed)
Virtual Visit via Video Note  I connected with pt on 05/25/20 at 11:20 AM EDT by a video enabled telemedicine application and verified that I am speaking with the correct person using two identifiers.  Location patient: home Location provider:work or home office Persons participating in the virtual visit: patient, provider  I discussed the limitations of evaluation and management by telemedicine and the availability of in person appointments. The patient expressed understanding and agreed to proceed.  Telemedicine visit is a necessity given the COVID-19 restrictions in place at the current time.  HPI: 59 y/o WF with hx of tachy/brady syndrome/persistent atrial fibrillation, obesity, and ASD repair being seen today for (covid + test yesterday, wants infusion".  Onset 2 nights ago, nasal drainage, next day progressed to coughing (dry) and HA. Yesterday home covid test +.  Today feels achy.  No fever.  No SOB. Ibup 800 mg helps her aching a lot. Tessalon pearls not helpul, "never have helped me".  Hydrocodone cough syrup has helped--Dr. Maudie Mercury has rx'd this for her in the past. No n/v/d or rash.  Eating fine, drinking fluids very well.  Husband with same sx's and covid +. She has not had the covid vaccine.    ROS: See pertinent positives and negatives per HPI.  Past Medical History:  Diagnosis Date   Allergic rhinitis    Anxiety    when tachycardia occurs   Arthritis    OA, s/p numerous surgeries -back, knees, hip   Benign fundic gland polyps of stomach    Cervical disc disease    Diverticulitis    CT Scan    DVT (deep venous thrombosis) (HCC)    in pregnancy, s/p hip surgery   Eustachian tube dysfunction    Fatty liver    Gallbladder problem    GERD 12/23/2009   diet related-not a problem   Hemorrhoids    Hiatal hernia    small   History of MRSA infection 2008   superficial skin-cleared easily with doxycycline   Obesity    Peripheral vascular disease ([place])     left leg after hip surgery   Persistent atrial fibrillation ([place]) 03/05/2007   a. s/p RFCA 12/18/2011, 07/29/12   s/p atrial septal defect repair 05/28/2016   S/P Maze operation for atrial fibrillation 05/28/2016   Complete bilateral atrial lesion set using cryothermy and bipolar radiofrequency ablation with clipping of LA appendage via median sternotomy   Sialoadenitis    Tachy-brady syndrome ([place])     Past Surgical History:  Procedure Laterality Date   ASD REPAIR N/A 05/28/2016   Procedure: ATRIAL SEPTAL DEFECT (ASD) closure;  Surgeon: Rexene Alberts, MD;  Location: [place];  Service: Open Heart Surgery;  Laterality: N/A;   ATRIAL FIBRILLATION ABLATION N/A 12/18/2011   Procedure: ATRIAL FIBRILLATION ABLATION;  Surgeon: [name], MD;  Location: [facility] CATH LAB;  Service: Cardiovascular;  Laterality: N/A;   ATRIAL FIBRILLATION ABLATION N/A 07/29/2012   Procedure: ATRIAL FIBRILLATION ABLATION;  Surgeon: [name], MD;  Location: [facility] CATH LAB;  Service: Cardiovascular;  Laterality: N/A;   CARDIAC CATHETERIZATION N/A 04/10/2016   Procedure: Right/Left Heart Cath and Coronary Angiography;  Surgeon: Jettie Booze, MD;  Location: [place] CV LAB;  Service: Cardiovascular;  Laterality: N/A;   CERVICAL FUSION     CESAREAN SECTION     x 2   CHOLECYSTECTOMY     CLIPPING OF ATRIAL APPENDAGE  05/28/2016   Procedure: CLIPPING OF ATRIAL APPENDAGE;  Surgeon: Rexene Alberts, MD;  [facility];  Service: Open Heart Surgery;;   COLONOSCOPY  07/16/2011   diverticulosis   ELBOW SURGERY     Right   EP IMPLANTABLE DEVICE N/A 09/10/2015   Procedure: Loop Recorder Insertion;  Surgeon: [name], MD;  Location: [place] CV LAB;  Service: Cardiovascular;  Laterality: N/A;   EP IMPLANTABLE DEVICE N/A 06/08/2016   MDT Marcelino Scot CRT-P implanted by Dr Curt Bears   FLEXIBLE SIGMOIDOSCOPY     [phone]   [place] Left 12/12/2012    Procedure: LEFT KNEE EXCISION SAPHENOUS NEUROMA/OPEN SCAR DEBRIDEMENT/POLY EXCHANGE/NERVE EXCISION;  Surgeon: Mauri Pole, MD;  [facility];  Service: Orthopedics;  Laterality: Left;   JOINT REPLACEMENT Bilateral    4'14  knees   KNEE SURGERY     x 9 Left Knee   MAZE  05/28/2016   Procedure: MAZE;  Surgeon: Rexene Alberts, MD;  Location: [place];  Service: Open Heart Surgery;;   MEDIASTERNOTOMY N/A 05/28/2016   Procedure: MEDIAN STERNOTOMY;  Surgeon: Rexene Alberts, MD;  Location: [place];  Service: Open Heart Surgery;  Laterality: N/A;   REDUCTION MAMMAPLASTY Bilateral    SHOULDER SURGERY     Right    TEE WITHOUT CARDIOVERSION  12/17/2011   Procedure: TRANSESOPHAGEAL ECHOCARDIOGRAM (TEE);  Surgeon: Larey Dresser, MD;  Location: [facility] ENDOSCOPY;  Service: Cardiovascular;  Laterality: N/A;   TEE WITHOUT CARDIOVERSION  07/28/2012   Procedure: TRANSESOPHAGEAL ECHOCARDIOGRAM (TEE);  Surgeon: Thayer Headings, MD;  Location: [place];  Service: Cardiovascular;  Laterality: N/A;   TEE WITHOUT CARDIOVERSION N/A 01/01/2016   Procedure: TRANSESOPHAGEAL ECHOCARDIOGRAM (TEE);  Surgeon: Sanda Klein, MD;  Location: [facility] ENDOSCOPY;  Service: Cardiovascular;  Laterality: N/A;   TEE WITHOUT CARDIOVERSION N/A 05/28/2016   Procedure: TRANSESOPHAGEAL ECHOCARDIOGRAM (TEE);  Surgeon: Rexene Alberts, MD;  Location: [place];  Service: Open Heart Surgery;  Laterality: N/A;   TOTAL HIP ARTHROPLASTY Left 10/17/2013   Procedure: LEFT TOTAL HIP ARTHROPLASTY ANTERIOR APPROACH;  Surgeon: Mauri Pole, MD;  [facility];  Service: Orthopedics;  Laterality: Left;   TOTAL KNEE ARTHROPLASTY Right 12/12/2012   Procedure: RIGHT TOTAL KNEE ARTHROPLASTY;  Surgeon: Mauri Pole, MD;  [facility];  Service: Orthopedics;  Laterality: Right;   TUBAL LIGATION     UPPER GASTROINTESTINAL ENDOSCOPY  01/30/2010   hiatal hernia, fundic gland polyps   WRIST SURGERY     Left      Current Outpatient  Medications:    acetaminophen (TYLENOL) 325 MG tablet, Take 1-2 tablets (325-650 mg total) by mouth every 6 (six) hours as needed for mild pain or headache (For pain.)., Disp: , Rfl:    Ascorbic Acid (VITAMIN C PO), Take by mouth daily., Disp: , Rfl:    ibuprofen (ADVIL) 800 MG tablet, ibuprofen 800 mg tablet  Take 1 tablet every day by oral route at bedtime., Disp: , Rfl:    metoprolol succinate (TOPROL-XL) 100 MG 24 hr tablet, TAKE 1 TABLET BY MOUTH EVERY DAY, Disp: 90 tablet, Rfl: 3   Multiple Vitamin (MULTIVITAMIN) capsule, Take 1 capsule by mouth daily., Disp: , Rfl:    Multiple Vitamins-Minerals (ZINC PO), Take by mouth daily., Disp: , Rfl:    VITAMIN D PO, Take by mouth daily., Disp: , Rfl:    amoxicillin (AMOXIL) 500 MG capsule, TAKE 4 CAPSULES 1 HOUR BEFORE DENTAL TREATMENT (Patient not taking: Reported on 05/25/2020), Disp: ,  Rfl:   EXAM:  VITALS per patient if applicable:  Vitals with BMI 04/12/2020 12/11/2019 11/14/2018  Height 5\' 11"  - -  Weight 245 lbs - -  BMI 45.40 - -  Systolic 981 - 191  Diastolic 78 - 73  Pulse 67 80 68    GENERAL: alert, oriented, appears well and in no acute distress  HEENT: atraumatic, conjunttiva clear, no obvious abnormalities on inspection of external nose and ears  NECK: normal movements of the head and neck  LUNGS: on inspection no signs of respiratory distress, breathing rate appears normal, no obvious gross SOB, gasping or wheezing  CV: no obvious cyanosis  MS: moves all visible extremities without noticeable abnormality  PSYCH/NEURO: pleasant and cooperative, no obvious depression or anxiety, speech and thought processing grossly intact  LABS: none today    Chemistry      Component Value Date/Time   NA 143 05/18/2017 0904   K 4.5 05/18/2017 0904   CL 103 05/18/2017 0904   CO2 23 05/18/2017 0904   BUN 19 05/18/2017 0904   CREATININE 0.84 05/18/2017 0904   CREATININE 0.91 06/24/2016 1343      Component Value Date/Time    CALCIUM 9.3 05/18/2017 0904   ALKPHOS 121 (H) 05/18/2017 0904   AST 13 05/18/2017 0904   ALT 17 05/18/2017 0904   BILITOT 0.5 05/18/2017 0904     Lab Results  Component Value Date   HGBA1C 5.3 05/18/2017   Lab Results  Component Value Date   WBC 6.8 01/18/2017   HGB 11.9 01/18/2017   HCT 36.6 01/18/2017   MCV 82 01/18/2017   PLT 458 (H) 06/24/2016    ASSESSMENT AND PLAN:  Discussed the following assessment and plan:  Covid 19 virus respiratory infection. Mild/mod:  no sign of resp failure, bacterial resp infection, or sepsis. She does have RF's that make her at high risk of complications from covid infection. Will give her info to the monoclonal antibody infusion center and see if she can get set up with this treatment. I did rx hycodan suspension, 1-2 tsp tid, #155ml today. Continue ibup/tylenol/fluids, rest.  Signs/symptoms to call or return for were reviewed and pt expressed understanding.  -we discussed possible serious and likely etiologies, options for evaluation and workup, limitations of telemedicine visit vs in person visit, treatment, treatment risks and precautions. Pt prefers to treat via telemedicine empirically rather than in person at this moment.    I discussed the assessment and treatment plan with the patient. The patient was provided an opportunity to ask questions and all were answered. The patient agreed with the plan and demonstrated an understanding of the instructions.    F/u: if not improving in 3-4 d or if worsening prior.  Signed:  Crissie Sickles, MD           05/25/2020

## 2020-05-26 ENCOUNTER — Other Ambulatory Visit: Payer: Self-pay | Admitting: Physician Assistant

## 2020-05-26 DIAGNOSIS — Z8679 Personal history of other diseases of the circulatory system: Secondary | ICD-10-CM

## 2020-05-26 DIAGNOSIS — E669 Obesity, unspecified: Secondary | ICD-10-CM

## 2020-05-26 DIAGNOSIS — Z8774 Personal history of (corrected) congenital malformations of heart and circulatory system: Secondary | ICD-10-CM

## 2020-05-26 DIAGNOSIS — I428 Other cardiomyopathies: Secondary | ICD-10-CM

## 2020-05-26 DIAGNOSIS — I495 Sick sinus syndrome: Secondary | ICD-10-CM

## 2020-05-26 DIAGNOSIS — I4819 Other persistent atrial fibrillation: Secondary | ICD-10-CM

## 2020-05-26 DIAGNOSIS — U071 COVID-19: Secondary | ICD-10-CM

## 2020-05-26 DIAGNOSIS — I10 Essential (primary) hypertension: Secondary | ICD-10-CM

## 2020-05-26 NOTE — Progress Notes (Signed)
I connected by phone with Destiny Dennis on 05/26/2020 at 11:38 AM to discuss the potential use of a new treatment for mild to moderate COVID-19 viral infection in non-hospitalized patients.  This patient is a 59 y.o. female that meets the FDA criteria for Emergency Use Authorization of COVID monoclonal antibody casirivimab/imdevimab or bamlanivimab/eteseviamb.  Has a (+) direct SARS-CoV-2 viral test result  Has mild or moderate COVID-19   Is NOT hospitalized due to COVID-19  Is within 10 days of symptom onset  Has at least one of the high risk factor(s) for progression to severe COVID-19 and/or hospitalization as defined in EUA.  Specific high risk criteria : BMI > 25 and Cardiovascular disease or hypertension   I have spoken and communicated the following to the patient or parent/caregiver regarding COVID monoclonal antibody treatment:  1. FDA has authorized the emergency use for the treatment of mild to moderate COVID-19 in adults and pediatric patients with positive results of direct SARS-CoV-2 viral testing who are 79 years of age and older weighing at least 40 kg, and who are at high risk for progressing to severe COVID-19 and/or hospitalization.  2. The significant known and potential risks and benefits of COVID monoclonal antibody, and the extent to which such potential risks and benefits are unknown.  3. Information on available alternative treatments and the risks and benefits of those alternatives, including clinical trials.  4. Patients treated with COVID monoclonal antibody should continue to self-isolate and use infection control measures (e.g., wear mask, isolate, social distance, avoid sharing personal items, clean and disinfect "high touch" surfaces, and frequent handwashing) according to CDC guidelines.   5. The patient or parent/caregiver has the option to accept or refuse COVID monoclonal antibody treatment.  After reviewing this information with the patient, the  patient has agreed to receive one of the available covid 19 monoclonal antibodies and will be provided an appropriate fact sheet prior to infusion.   [place], [place] 05/26/2020 11:38 AM

## 2020-05-27 ENCOUNTER — Ambulatory Visit (HOSPITAL_COMMUNITY)
Admission: RE | Admit: 2020-05-27 | Discharge: 2020-05-27 | Disposition: A | Discharge status: Home / Self Care | Payer: 59 | Source: Ambulatory Visit | Attending: Family Medicine | Admitting: Family Medicine

## 2020-05-27 DIAGNOSIS — U071 COVID-19: Secondary | ICD-10-CM | POA: Diagnosis not present

## 2020-05-27 MED ORDER — METHYLPREDNISOLONE SODIUM SUCC 125 MG IJ SOLR: 125.0000 mg | Freq: Once | INTRAMUSCULAR | Status: DC | PRN

## 2020-05-27 MED ORDER — SODIUM CHLORIDE 0.9 % IV SOLN: Freq: Once | INTRAVENOUS | Status: AC

## 2020-05-27 MED ORDER — SODIUM CHLORIDE 0.9 % IV SOLN: INTRAVENOUS | Status: DC | PRN

## 2020-05-27 MED ORDER — EPINEPHRINE 0.3 MG/0.3ML IJ SOAJ: 0.3000 mg | Freq: Once | INTRAMUSCULAR | Status: DC | PRN

## 2020-05-27 MED ORDER — DIPHENHYDRAMINE HCL 50 MG/ML IJ SOLN: 50.0000 mg | Freq: Once | INTRAMUSCULAR | Status: DC | PRN

## 2020-05-27 MED ORDER — FAMOTIDINE IN NACL 20-0.9 MG/50ML-% IV SOLN: 20.0000 mg | Freq: Once | INTRAVENOUS | Status: DC | PRN

## 2020-05-27 MED ORDER — ALBUTEROL SULFATE HFA 108 (90 BASE) MCG/ACT IN AERS: 2.0000 | INHALATION_SPRAY | Freq: Once | RESPIRATORY_TRACT | Status: DC | PRN

## 2020-05-27 NOTE — Discharge Instructions (Signed)
COVID-19 COVID-19 is a respiratory infection that is caused by a virus called severe acute respiratory syndrome coronavirus 2 (SARS-CoV-2). The disease is also known as coronavirus disease or novel coronavirus. In some people, the virus may not cause any symptoms. In others, it may cause a serious infection. The infection can get worse quickly and can lead to complications, such as:  Pneumonia, or infection of the lungs.  Acute respiratory distress syndrome or ARDS. This is a condition in which fluid build-up in the lungs prevents the lungs from filling with air and passing oxygen into the blood.  Acute respiratory failure. This is a condition in which there is not enough oxygen passing from the lungs to the body or when carbon dioxide is not passing from the lungs out of the body.  Sepsis or septic shock. This is a serious bodily reaction to an infection.  Blood clotting problems.  Secondary infections due to bacteria or fungus.  Organ failure. This is when your body's organs stop working. The virus that causes COVID-19 is contagious. This means that it can spread from person to person through droplets from coughs and sneezes (respiratory secretions). What are the causes? This illness is caused by a virus. You may catch the virus by:  Breathing in droplets from an infected person. Droplets can be spread by a person breathing, speaking, singing, coughing, or sneezing.  Touching something, like a table or a doorknob, that was exposed to the virus (contaminated) and then touching your mouth, nose, or eyes. What increases the risk? Risk for infection You are more likely to be infected with this virus if you:  Are within 6 feet (2 meters) of a person with COVID-19.  Provide care for or live with a person who is infected with COVID-19.  Spend time in crowded indoor spaces or live in shared housing. Risk for serious illness You are more likely to become seriously ill from the virus if  you:  Are 50 years of age or older. The higher your age, the more you are at risk for serious illness.  Live in a nursing home or long-term care facility.  Have cancer.  Have a long-term (chronic) disease such as: ? Chronic lung disease, including chronic obstructive pulmonary disease or asthma. ? A long-term disease that lowers your body's ability to fight infection (immunocompromised). ? Heart disease, including heart failure, a condition in which the arteries that lead to the heart become narrow or blocked (coronary artery disease), a disease which makes the heart muscle thick, weak, or stiff (cardiomyopathy). ? Diabetes. ? Chronic kidney disease. ? Sickle cell disease, a condition in which red blood cells have an abnormal "sickle" shape. ? Liver disease.  Are obese. What are the signs or symptoms? Symptoms of this condition can range from mild to severe. Symptoms may appear any time from 2 to 14 days after being exposed to the virus. They include:  A fever or chills.  A cough.  Difficulty breathing.  Headaches, body aches, or muscle aches.  Runny or stuffy (congested) nose.  A sore throat.  New loss of taste or smell. Some people may also have stomach problems, such as nausea, vomiting, or diarrhea. Other people may not have any symptoms of COVID-19. How is this diagnosed? This condition may be diagnosed based on:  Your signs and symptoms, especially if: ? You live in an area with a COVID-19 outbreak. ? You recently traveled to or from an area where the virus is common. ? You   provide care for or live with a person who was diagnosed with COVID-19. ? You were exposed to a person who was diagnosed with COVID-19.  A physical exam.  Lab tests, which may include: ? Taking a sample of fluid from the back of your nose and throat (nasopharyngeal fluid), your nose, or your throat using a swab. ? A sample of mucus from your lungs (sputum). ? Blood tests.  Imaging tests,  which may include, X-rays, CT scan, or ultrasound. How is this treated? At present, there is no medicine to treat COVID-19. Medicines that treat other diseases are being used on a trial basis to see if they are effective against COVID-19. Your health care provider will talk with you about ways to treat your symptoms. For most people, the infection is mild and can be managed at home with rest, fluids, and over-the-counter medicines. Treatment for a serious infection usually takes places in a hospital intensive care unit (ICU). It may include one or more of the following treatments. These treatments are given until your symptoms improve.  Receiving fluids and medicines through an IV.  Supplemental oxygen. Extra oxygen is given through a tube in the nose, a face mask, or a hood.  Positioning you to lie on your stomach (prone position). This makes it easier for oxygen to get into the lungs.  Continuous positive airway pressure (CPAP) or bi-level positive airway pressure (BPAP) machine. This treatment uses mild air pressure to keep the airways open. A tube that is connected to a motor delivers oxygen to the body.  Ventilator. This treatment moves air into and out of the lungs by using a tube that is placed in your windpipe.  Tracheostomy. This is a procedure to create a hole in the neck so that a breathing tube can be inserted.  Extracorporeal membrane oxygenation (ECMO). This procedure gives the lungs a chance to recover by taking over the functions of the heart and lungs. It supplies oxygen to the body and removes carbon dioxide. Follow these instructions at home: Lifestyle  If you are sick, stay home except to get medical care. Your health care provider will tell you how long to stay home. Call your health care provider before you go for medical care.  Rest at home as told by your health care provider.  Do not use any products that contain nicotine or tobacco, such as cigarettes,  e-cigarettes, and chewing tobacco. If you need help quitting, ask your health care provider.  Return to your normal activities as told by your health care provider. Ask your health care provider what activities are safe for you. General instructions  Take over-the-counter and prescription medicines only as told by your health care provider.  Drink enough fluid to keep your urine pale yellow.  Keep all follow-up visits as told by your health care provider. This is important. How is this prevented?  There is no vaccine to help prevent COVID-19 infection. However, there are steps you can take to protect yourself and others from this virus. To protect yourself:   Do not travel to areas where COVID-19 is a risk. The areas where COVID-19 is reported change often. To identify high-risk areas and travel restrictions, check the CDC travel website: wwwnc.cdc.gov/travel/notices  If you live in, or must travel to, an area where COVID-19 is a risk, take precautions to avoid infection. ? Stay away from people who are sick. ? Wash your hands often with soap and water for 20 seconds. If soap and water   are not available, use an alcohol-based hand sanitizer. ? Avoid touching your mouth, face, eyes, or nose. ? Avoid going out in public, follow guidance from your state and local health authorities. ? If you must go out in public, wear a cloth face covering or face mask. Make sure your mask covers your nose and mouth. ? Avoid crowded indoor spaces. Stay at least 6 feet (2 meters) away from others. ? Disinfect objects and surfaces that are frequently touched every day. This may include:  Counters and tables.  Doorknobs and light switches.  Sinks and faucets.  Electronics, such as phones, remote controls, keyboards, computers, and tablets. To protect others: If you have symptoms of COVID-19, take steps to prevent the virus from spreading to others.  If you think you have a COVID-19 infection, contact  your health care provider right away. Tell your health care team that you think you may have a COVID-19 infection.  Stay home. Leave your house only to seek medical care. Do not use public transport.  Do not travel while you are sick.  Wash your hands often with soap and water for 20 seconds. If soap and water are not available, use alcohol-based hand sanitizer.  Stay away from other members of your household. Let healthy household members care for children and pets, if possible. If you have to care for children or pets, wash your hands often and wear a mask. If possible, stay in your own room, separate from others. Use a different bathroom.  Make sure that all people in your household wash their hands well and often.  Cough or sneeze into a tissue or your sleeve or elbow. Do not cough or sneeze into your hand or into the air.  Wear a cloth face covering or face mask. Make sure your mask covers your nose and mouth. Where to find more information  Centers for Disease Control and Prevention: www.cdc.gov/coronavirus/2019-ncov/index.html  World Health Organization: www.who.int/health-topics/coronavirus Contact a health care provider if:  You live in or have traveled to an area where COVID-19 is a risk and you have symptoms of the infection.  You have had contact with someone who has COVID-19 and you have symptoms of the infection. Get help right away if:  You have trouble breathing.  You have pain or pressure in your chest.  You have confusion.  You have bluish lips and fingernails.  You have difficulty waking from sleep.  You have symptoms that get worse. These symptoms may represent a serious problem that is an emergency. Do not wait to see if the symptoms will go away. Get medical help right away. Call your local emergency services (911 in the [place] Do not drive yourself to the hospital. Let the emergency medical personnel know if you think you have  COVID-19. Summary  COVID-19 is a respiratory infection that is caused by a virus. It is also known as coronavirus disease or novel coronavirus. It can cause serious infections, such as pneumonia, acute respiratory distress syndrome, acute respiratory failure, or sepsis.  The virus that causes COVID-19 is contagious. This means that it can spread from person to person through droplets from breathing, speaking, singing, coughing, or sneezing.  You are more likely to develop a serious illness if you are 50 years of age or older, have a weak immune system, live in a nursing home, or have chronic disease.  There is no medicine to treat COVID-19. Your health care provider will talk with you about ways to treat your symptoms.    Take steps to protect yourself and others from infection. Wash your hands often and disinfect objects and surfaces that are frequently touched every day. Stay away from people who are sick and wear a mask if you are sick. This information is not intended to replace advice given to you by your health care provider. Make sure you discuss any questions you have with your health care provider. Document Revised: 06/02/2019 Document Reviewed: 09/08/2018 Elsevier Patient Education  2020 [employer] What types of side effects do monoclonal antibody drugs cause?  Common side effects  In general, the more common side effects caused by monoclonal antibody drugs include: . Allergic reactions, such as hives or itching . Flu-like signs and symptoms, including chills, fatigue, fever, and muscle aches and pains . Nausea, vomiting . Diarrhea . Skin rashes . Low blood pressure   The CDC is recommending patients who receive monoclonal antibody treatments wait at least 90 days before being vaccinated.  Currently, there are no data on the safety and efficacy of mRNA COVID-19 vaccines in persons who received monoclonal antibodies or convalescent plasma as part of COVID-19 treatment. Based  on the estimated half-life of such therapies as well as evidence suggesting that reinfection is uncommon in the 90 days after initial infection, vaccination should be deferred for at least 90 days, as a precautionary measure until additional information becomes available, to avoid interference of the antibody treatment with vaccine-induced immune responses. 

## 2020-05-27 NOTE — Progress Notes (Signed)
°  Diagnosis: COVID-19  Physician:Dr. Joya Gaskins  Procedure: [facility] Med: bamlanivimab\etesevimab infusion - Provided patient with bamlanimivab\etesevimab fact sheet for patients, parents and caregivers prior to infusion.  Complications: No immediate complications noted.  Discharge: Discharged home   Destiny Dennis 05/27/2020

## 2020-05-29 ENCOUNTER — Other Ambulatory Visit (HOSPITAL_COMMUNITY): Payer: 59

## 2020-06-13 ENCOUNTER — Other Ambulatory Visit (HOSPITAL_COMMUNITY): Payer: Self-pay

## 2020-07-04 ENCOUNTER — Ambulatory Visit (INDEPENDENT_AMBULATORY_CARE_PROVIDER_SITE_OTHER): Payer: 59

## 2020-07-04 DIAGNOSIS — I4819 Other persistent atrial fibrillation: Secondary | ICD-10-CM

## 2020-07-04 LAB — CUP PACEART REMOTE DEVICE CHECK
Battery Remaining Longevity: 71 mo
Battery Voltage: 2.96 V
Brady Statistic AP VP Percent: 98.31 %
Brady Statistic AP VS Percent: 1.66 %
Brady Statistic AS VP Percent: 0.02 %
Brady Statistic AS VS Percent: 0.02 %
Brady Statistic RA Percent Paced: 99.97 %
Brady Statistic RV Percent Paced: 63.56 %
Date Time Interrogation Session: 20211117200947
Implantable Lead Implant Date: 20171023
Implantable Lead Implant Date: 20171023
Implantable Lead Implant Date: 20171023
Implantable Lead Location: 753858
Implantable Lead Location: 753859
Implantable Lead Location: 753860
Implantable Lead Model: 4298
Implantable Lead Model: 5076
Implantable Lead Model: 5076
Implantable Pulse Generator Implant Date: 20171023
Lead Channel Impedance Value: 304 Ohm
Lead Channel Impedance Value: 361 Ohm
Lead Channel Impedance Value: 380 Ohm
Lead Channel Impedance Value: 399 Ohm
Lead Channel Impedance Value: 399 Ohm
Lead Channel Impedance Value: 418 Ohm
Lead Channel Impedance Value: 475 Ohm
Lead Channel Impedance Value: 646 Ohm
Lead Channel Impedance Value: 646 Ohm
Lead Channel Impedance Value: 665 Ohm
Lead Channel Impedance Value: 665 Ohm
Lead Channel Impedance Value: 684 Ohm
Lead Channel Impedance Value: 893 Ohm
Lead Channel Impedance Value: 931 Ohm
Lead Channel Pacing Threshold Amplitude: 0.375 V
Lead Channel Pacing Threshold Amplitude: 0.5 V
Lead Channel Pacing Threshold Amplitude: 1.375 V
Lead Channel Pacing Threshold Pulse Width: 0.4 ms
Lead Channel Pacing Threshold Pulse Width: 0.4 ms
Lead Channel Pacing Threshold Pulse Width: 0.4 ms
Lead Channel Sensing Intrinsic Amplitude: 1.625 mV
Lead Channel Sensing Intrinsic Amplitude: 1.625 mV
Lead Channel Sensing Intrinsic Amplitude: 13.625 mV
Lead Channel Sensing Intrinsic Amplitude: 13.625 mV
Lead Channel Setting Pacing Amplitude: 2 V
Lead Channel Setting Pacing Amplitude: 2 V
Lead Channel Setting Pacing Amplitude: 2.5 V
Lead Channel Setting Pacing Pulse Width: 0.4 ms
Lead Channel Setting Pacing Pulse Width: 0.4 ms
Lead Channel Setting Sensing Sensitivity: 2 mV

## 2020-07-05 NOTE — Progress Notes (Signed)
Remote pacemaker transmission.   

## 2020-08-01 ENCOUNTER — Other Ambulatory Visit: Payer: Self-pay

## 2020-08-01 ENCOUNTER — Ambulatory Visit (HOSPITAL_COMMUNITY): Payer: 59 | Attending: Cardiology

## 2020-08-01 DIAGNOSIS — I495 Sick sinus syndrome: Secondary | ICD-10-CM | POA: Diagnosis present

## 2020-08-01 DIAGNOSIS — I428 Other cardiomyopathies: Secondary | ICD-10-CM

## 2020-08-01 DIAGNOSIS — I4819 Other persistent atrial fibrillation: Secondary | ICD-10-CM

## 2020-08-01 LAB — ECHOCARDIOGRAM COMPLETE
Area-P 1/2: 3.77 cm2
S' Lateral: 4.1 cm

## 2020-10-03 ENCOUNTER — Ambulatory Visit (INDEPENDENT_AMBULATORY_CARE_PROVIDER_SITE_OTHER): Payer: 59

## 2020-10-03 ENCOUNTER — Other Ambulatory Visit: Payer: Self-pay | Admitting: Physician Assistant

## 2020-10-03 DIAGNOSIS — G44209 Tension-type headache, unspecified, not intractable: Secondary | ICD-10-CM

## 2020-10-03 DIAGNOSIS — I495 Sick sinus syndrome: Secondary | ICD-10-CM | POA: Diagnosis not present

## 2020-10-04 LAB — CUP PACEART REMOTE DEVICE CHECK
Battery Remaining Longevity: 70 mo
Battery Voltage: 2.96 V
Brady Statistic AP VP Percent: 98.24 %
Brady Statistic AP VS Percent: 1.69 %
Brady Statistic AS VP Percent: 0.04 %
Brady Statistic AS VS Percent: 0.03 %
Brady Statistic RA Percent Paced: 99.93 %
Brady Statistic RV Percent Paced: 50 %
Date Time Interrogation Session: 20220217002753
Implantable Lead Implant Date: 20171023
Implantable Lead Implant Date: 20171023
Implantable Lead Implant Date: 20171023
Implantable Lead Location: 753858
Implantable Lead Location: 753859
Implantable Lead Location: 753860
Implantable Lead Model: 4298
Implantable Lead Model: 5076
Implantable Lead Model: 5076
Implantable Pulse Generator Implant Date: 20171023
Lead Channel Impedance Value: 304 Ohm
Lead Channel Impedance Value: 361 Ohm
Lead Channel Impedance Value: 380 Ohm
Lead Channel Impedance Value: 380 Ohm
Lead Channel Impedance Value: 399 Ohm
Lead Channel Impedance Value: 418 Ohm
Lead Channel Impedance Value: 494 Ohm
Lead Channel Impedance Value: 646 Ohm
Lead Channel Impedance Value: 646 Ohm
Lead Channel Impedance Value: 646 Ohm
Lead Channel Impedance Value: 665 Ohm
Lead Channel Impedance Value: 665 Ohm
Lead Channel Impedance Value: 855 Ohm
Lead Channel Impedance Value: 874 Ohm
Lead Channel Pacing Threshold Amplitude: 0.375 V
Lead Channel Pacing Threshold Amplitude: 0.5 V
Lead Channel Pacing Threshold Amplitude: 1.25 V
Lead Channel Pacing Threshold Pulse Width: 0.4 ms
Lead Channel Pacing Threshold Pulse Width: 0.4 ms
Lead Channel Pacing Threshold Pulse Width: 0.4 ms
Lead Channel Sensing Intrinsic Amplitude: 14.625 mV
Lead Channel Sensing Intrinsic Amplitude: 14.625 mV
Lead Channel Sensing Intrinsic Amplitude: 2 mV
Lead Channel Sensing Intrinsic Amplitude: 2 mV
Lead Channel Setting Pacing Amplitude: 1.75 V
Lead Channel Setting Pacing Amplitude: 2 V
Lead Channel Setting Pacing Amplitude: 2.5 V
Lead Channel Setting Pacing Pulse Width: 0.4 ms
Lead Channel Setting Pacing Pulse Width: 0.4 ms
Lead Channel Setting Sensing Sensitivity: 2 mV

## 2020-10-09 ENCOUNTER — Other Ambulatory Visit: Payer: 59

## 2020-10-09 ENCOUNTER — Ambulatory Visit
Admission: RE | Admit: 2020-10-09 | Discharge: 2020-10-09 | Disposition: A | Discharge status: Home / Self Care | Payer: Worker's Compensation | Source: Ambulatory Visit | Attending: Physician Assistant | Admitting: Physician Assistant

## 2020-10-09 ENCOUNTER — Other Ambulatory Visit: Payer: Self-pay

## 2020-10-09 DIAGNOSIS — G44209 Tension-type headache, unspecified, not intractable: Secondary | ICD-10-CM

## 2020-10-09 NOTE — Progress Notes (Signed)
Remote pacemaker transmission.   

## 2020-10-15 ENCOUNTER — Other Ambulatory Visit: Payer: Self-pay | Admitting: Obstetrics and Gynecology

## 2020-10-15 DIAGNOSIS — Z1231 Encounter for screening mammogram for malignant neoplasm of breast: Secondary | ICD-10-CM

## 2020-11-06 ENCOUNTER — Encounter: Payer: Self-pay | Admitting: Dermatology

## 2020-11-06 ENCOUNTER — Other Ambulatory Visit: Payer: Self-pay

## 2020-11-06 ENCOUNTER — Ambulatory Visit (INDEPENDENT_AMBULATORY_CARE_PROVIDER_SITE_OTHER): Payer: 59 | Admitting: Dermatology

## 2020-11-06 DIAGNOSIS — D235 Other benign neoplasm of skin of trunk: Secondary | ICD-10-CM | POA: Diagnosis not present

## 2020-11-06 DIAGNOSIS — Z1283 Encounter for screening for malignant neoplasm of skin: Secondary | ICD-10-CM

## 2020-11-06 DIAGNOSIS — L570 Actinic keratosis: Secondary | ICD-10-CM | POA: Diagnosis not present

## 2020-11-06 DIAGNOSIS — D239 Other benign neoplasm of skin, unspecified: Secondary | ICD-10-CM

## 2020-11-06 NOTE — Patient Instructions (Signed)
Call back in one month.

## 2020-11-17 ENCOUNTER — Encounter: Payer: Self-pay | Admitting: Dermatology

## 2020-11-17 NOTE — Progress Notes (Signed)
   Follow-Up Visit   Subjective  Destiny Dennis is a 60 y.o. female who presents for the following: Skin Problem (Left temple x years- keep hitting with my glasses).  Crust on left temple, skin check Location:  Duration:  Quality:  Associated Signs/Symptoms: Modifying Factors:  Severity:  Timing: Context:   Objective  Well appearing patient in no apparent distress; mood and affect are within normal limits. Objective  Chest - Medial [facility]): Waist up body check. No atypical moles, no skin cancer  Objective  Right Upper Back: Firm pink 5 mm dermal papule  Objective  Left Temple: Subtle pink flat 8 mm crust    All skin waist up examined.  Areas beneath undergarments not fully examined.   Assessment & Plan    Screening exam for skin cancer Chest - Medial [facility])  Yearly skin check.  Dermatofibroma Right Upper Back  Leave if stable  Lichenoid actinic keratosis Left Temple  Destruction of lesion - Left Temple Complexity: simple   Destruction method: cryotherapy   Informed consent: discussed and consent obtained   Timeout:  patient name, date of birth, surgical site, and procedure verified Lesion destroyed using liquid nitrogen: Yes   Cryotherapy cycles:  3 Outcome: patient tolerated procedure well with no complications   Post-procedure details: wound care instructions given        I, Lavonna Monarch, MD, have reviewed all documentation for this visit.  The documentation on 11/17/20 for the exam, diagnosis, procedures, and orders are all accurate and complete.

## 2020-11-29 ENCOUNTER — Inpatient Hospital Stay: Admission: RE | Admit: 2020-11-29 | Payer: 59 | Source: Ambulatory Visit

## 2020-11-29 DIAGNOSIS — Z1231 Encounter for screening mammogram for malignant neoplasm of breast: Secondary | ICD-10-CM

## 2020-12-04 ENCOUNTER — Other Ambulatory Visit: Payer: Self-pay

## 2020-12-04 ENCOUNTER — Ambulatory Visit
Admission: RE | Admit: 2020-12-04 | Discharge: 2020-12-04 | Disposition: A | Discharge status: Home / Self Care | Payer: 59 | Source: Ambulatory Visit | Attending: Obstetrics and Gynecology | Admitting: Obstetrics and Gynecology

## 2020-12-04 DIAGNOSIS — Z1231 Encounter for screening mammogram for malignant neoplasm of breast: Secondary | ICD-10-CM

## 2020-12-11 ENCOUNTER — Other Ambulatory Visit (HOSPITAL_COMMUNITY): Payer: Self-pay | Admitting: Sports Medicine

## 2020-12-11 ENCOUNTER — Other Ambulatory Visit: Payer: Self-pay | Admitting: Sports Medicine

## 2020-12-11 DIAGNOSIS — M25511 Pain in right shoulder: Secondary | ICD-10-CM

## 2021-01-02 ENCOUNTER — Ambulatory Visit (INDEPENDENT_AMBULATORY_CARE_PROVIDER_SITE_OTHER): Payer: 59

## 2021-01-02 DIAGNOSIS — I4819 Other persistent atrial fibrillation: Secondary | ICD-10-CM

## 2021-01-02 LAB — CUP PACEART REMOTE DEVICE CHECK
Battery Remaining Longevity: 62 mo
Battery Voltage: 2.96 V
Brady Statistic AP VP Percent: 98.21 %
Brady Statistic AP VS Percent: 1.68 %
Brady Statistic AS VP Percent: 0.06 %
Brady Statistic AS VS Percent: 0.05 %
Brady Statistic RA Percent Paced: 99.91 %
Brady Statistic RV Percent Paced: 57.81 %
Date Time Interrogation Session: 20220518224749
Implantable Lead Implant Date: 20171023
Implantable Lead Implant Date: 20171023
Implantable Lead Implant Date: 20171023
Implantable Lead Location: 753858
Implantable Lead Location: 753859
Implantable Lead Location: 753860
Implantable Lead Model: 4298
Implantable Lead Model: 5076
Implantable Lead Model: 5076
Implantable Pulse Generator Implant Date: 20171023
Lead Channel Impedance Value: 304 Ohm
Lead Channel Impedance Value: 361 Ohm
Lead Channel Impedance Value: 380 Ohm
Lead Channel Impedance Value: 380 Ohm
Lead Channel Impedance Value: 399 Ohm
Lead Channel Impedance Value: 437 Ohm
Lead Channel Impedance Value: 513 Ohm
Lead Channel Impedance Value: 646 Ohm
Lead Channel Impedance Value: 646 Ohm
Lead Channel Impedance Value: 665 Ohm
Lead Channel Impedance Value: 665 Ohm
Lead Channel Impedance Value: 684 Ohm
Lead Channel Impedance Value: 874 Ohm
Lead Channel Impedance Value: 912 Ohm
Lead Channel Pacing Threshold Amplitude: 0.5 V
Lead Channel Pacing Threshold Amplitude: 0.5 V
Lead Channel Pacing Threshold Amplitude: 1.25 V
Lead Channel Pacing Threshold Pulse Width: 0.4 ms
Lead Channel Pacing Threshold Pulse Width: 0.4 ms
Lead Channel Pacing Threshold Pulse Width: 0.4 ms
Lead Channel Sensing Intrinsic Amplitude: 1.375 mV
Lead Channel Sensing Intrinsic Amplitude: 1.375 mV
Lead Channel Sensing Intrinsic Amplitude: 13.125 mV
Lead Channel Sensing Intrinsic Amplitude: 13.125 mV
Lead Channel Setting Pacing Amplitude: 1.75 V
Lead Channel Setting Pacing Amplitude: 2 V
Lead Channel Setting Pacing Amplitude: 2.5 V
Lead Channel Setting Pacing Pulse Width: 0.4 ms
Lead Channel Setting Pacing Pulse Width: 0.4 ms
Lead Channel Setting Sensing Sensitivity: 2 mV

## 2021-01-09 ENCOUNTER — Ambulatory Visit: Payer: 59 | Admitting: Nurse Practitioner

## 2021-01-14 ENCOUNTER — Ambulatory Visit (HOSPITAL_COMMUNITY)

## 2021-01-24 NOTE — Progress Notes (Signed)
Remote pacemaker transmission.   

## 2021-01-30 ENCOUNTER — Ambulatory Visit (INDEPENDENT_AMBULATORY_CARE_PROVIDER_SITE_OTHER): Payer: 59 | Admitting: Physician Assistant

## 2021-01-30 ENCOUNTER — Encounter: Payer: Self-pay | Admitting: Physician Assistant

## 2021-01-30 VITALS — BP 126/70 | HR 71 | Ht 71.0 in | Wt 236.1 lb

## 2021-01-30 DIAGNOSIS — Z1212 Encounter for screening for malignant neoplasm of rectum: Secondary | ICD-10-CM | POA: Diagnosis not present

## 2021-01-30 DIAGNOSIS — Z1211 Encounter for screening for malignant neoplasm of colon: Secondary | ICD-10-CM | POA: Diagnosis not present

## 2021-01-30 DIAGNOSIS — Z8719 Personal history of other diseases of the digestive system: Secondary | ICD-10-CM

## 2021-01-30 NOTE — Progress Notes (Signed)
Chief Complaint: "Abdominal soreness"  HPI:    Destiny Dennis is a 60 year old Caucasian female with a past medical history of anxiety, DVT, reflux and multiple others listed below including A. fib (08/01/2020 echo with LVEF 55-60%), known to Dr. Carlean Purl, who was referred to me by Donnel Saxon, CNM for a complaint of "abdominal soreness".    07/16/2011 colonoscopy with severe diverticulosis in the sigmoid colon and scattered diverticuli in the right colon and otherwise normal.  Repeat recommended in 10 years.    04/04/2018 office visit with Dr. Carlean Purl for left lower quadrant abdominal pain.  At that time there was question about a hernia like a femoral hernia or a subtle inguinal hernia.  CT was ordered.    04/11/2018 CT abdomen pelvis with contrast showed focal area of inflammation near the upper sigmoid colon which could be epiploic appendagitis or small mesenteric infarct, suspect mild or resolving lower sigmoid diverticulitis, no complicating features.  Moderate sized hiatal hernia.  Status postcholecystectomy no biliary dilation.  At that time started Augmentin 875 twice daily x10 days and recommended ibuprofen 600 mg 3 times daily x2 weeks for epiploic appendagitis.    Today, the patient presents to clinic and tells me that about 4 months ago she had a conversation with Tye Savoy, NP about some left lower quadrant pain and was called in antibiotics (I can not see this record in our system).  She took this course and the pain went away.  Thinks this pain was started after eating a lot of popcorn because about a month ago she had more popcorn and started with some more left lower quadrant pain, so she eased back to a clear liquid diet for a few days and was able to get rid of this discomfort without antibiotics.  Tells me now she is doing well and maintains regular stools.  She has lost around 50 pounds on a diet that she is doing and feels well but was just following with [place] per recommendations.     Denies fever, chills, weight loss, blood in her stool, heartburn or reflux.     Past Medical History:  Diagnosis Date   Allergic rhinitis    Anxiety    when tachycardia occurs   Arthritis    OA, s/p numerous surgeries -back, knees, hip   Benign fundic gland polyps of stomach    Cervical disc disease    Diverticulitis    CT Scan    DVT (deep venous thrombosis) (HCC)    in pregnancy, s/p hip surgery   Eustachian tube dysfunction    Fatty liver    Gallbladder problem    GERD 12/23/2009   diet related-not a problem   Hemorrhoids    Hiatal hernia    small   History of MRSA infection 2008   superficial skin-cleared easily with doxycycline   Obesity    Peripheral vascular disease ([place])    left leg after hip surgery   Persistent atrial fibrillation ([place]) 03/05/2007   a. s/p RFCA 12/18/2011, 07/29/12   s/p atrial septal defect repair 05/28/2016   S/P Maze operation for atrial fibrillation 05/28/2016   Complete bilateral atrial lesion set using cryothermy and bipolar radiofrequency ablation with clipping of LA appendage via median sternotomy   Sialoadenitis    Tachy-brady syndrome ([place])     Past Surgical History:  Procedure Laterality Date   ASD REPAIR N/A 05/28/2016   Procedure: ATRIAL SEPTAL DEFECT (ASD) closure;  Surgeon: Rexene Alberts, MD;  Location: [facility]  OR;  Service: Open Heart Surgery;  Laterality: N/A;   ATRIAL FIBRILLATION ABLATION N/A 12/18/2011   Procedure: ATRIAL FIBRILLATION ABLATION;  Surgeon: [name], MD;  Location: [facility] CATH LAB;  Service: Cardiovascular;  Laterality: N/A;   ATRIAL FIBRILLATION ABLATION N/A 07/29/2012   Procedure: ATRIAL FIBRILLATION ABLATION;  Surgeon: [name], MD;  Location: [facility] CATH LAB;  Service: Cardiovascular;  Laterality: N/A;   CARDIAC CATHETERIZATION N/A 04/10/2016   Procedure: Right/Left Heart Cath and Coronary Angiography;  Surgeon: Jettie Booze, MD;  Location: [place] CV LAB;  Service: Cardiovascular;  Laterality: N/A;    CERVICAL FUSION     CESAREAN SECTION     x 2   CHOLECYSTECTOMY     CLIPPING OF ATRIAL APPENDAGE  05/28/2016   Procedure: CLIPPING OF ATRIAL APPENDAGE;  Surgeon: Rexene Alberts, MD;  Location: [place];  Service: Open Heart Surgery;;   COLONOSCOPY  07/16/2011   diverticulosis   ELBOW SURGERY     Right   EP IMPLANTABLE DEVICE N/A 09/10/2015   Procedure: Loop Recorder Insertion;  Surgeon: [name], MD;  Location: [place] CV LAB;  Service: Cardiovascular;  Laterality: N/A;   EP IMPLANTABLE DEVICE N/A 06/08/2016   MDT Marcelino Scot CRT-P implanted by Dr Curt Bears   FLEXIBLE SIGMOIDOSCOPY     [phone]   [place] Left 12/12/2012   Procedure: LEFT KNEE EXCISION SAPHENOUS NEUROMA/OPEN SCAR DEBRIDEMENT/POLY EXCHANGE/NERVE EXCISION;  Surgeon: Mauri Pole, MD;  [facility];  Service: Orthopedics;  Laterality: Left;   JOINT REPLACEMENT Bilateral    4'14  knees   KNEE SURGERY     x 9 Left Knee   MAZE  05/28/2016   Procedure: MAZE;  Surgeon: Rexene Alberts, MD;  Location: [place];  Service: Open Heart Surgery;;   MEDIASTERNOTOMY N/A 05/28/2016   Procedure: MEDIAN STERNOTOMY;  Surgeon: Rexene Alberts, MD;  Location: [place];  Service: Open Heart Surgery;  Laterality: N/A;   REDUCTION MAMMAPLASTY Bilateral    SHOULDER SURGERY     Right    TEE WITHOUT CARDIOVERSION  12/17/2011   Procedure: TRANSESOPHAGEAL ECHOCARDIOGRAM (TEE);  Surgeon: Larey Dresser, MD;  Location: [facility] ENDOSCOPY;  Service: Cardiovascular;  Laterality: N/A;   TEE WITHOUT CARDIOVERSION  07/28/2012   Procedure: TRANSESOPHAGEAL ECHOCARDIOGRAM (TEE);  Surgeon: Thayer Headings, MD;  Location: [place];  Service: Cardiovascular;  Laterality: N/A;   TEE WITHOUT CARDIOVERSION N/A 01/01/2016   Procedure: TRANSESOPHAGEAL ECHOCARDIOGRAM (TEE);  Surgeon: Sanda Klein, MD;  Location: [facility] ENDOSCOPY;  Service: Cardiovascular;  Laterality: N/A;   TEE WITHOUT CARDIOVERSION N/A 05/28/2016    Procedure: TRANSESOPHAGEAL ECHOCARDIOGRAM (TEE);  Surgeon: Rexene Alberts, MD;  Location: [place];  Service: Open Heart Surgery;  Laterality: N/A;   TOTAL HIP ARTHROPLASTY Left 10/17/2013   Procedure: LEFT TOTAL HIP ARTHROPLASTY ANTERIOR APPROACH;  Surgeon: Mauri Pole, MD;  [facility];  Service: Orthopedics;  Laterality: Left;   TOTAL KNEE ARTHROPLASTY Right 12/12/2012   Procedure: RIGHT TOTAL KNEE ARTHROPLASTY;  Surgeon: Mauri Pole, MD;  [facility];  Service: Orthopedics;  Laterality: Right;   TUBAL LIGATION     UPPER GASTROINTESTINAL ENDOSCOPY  01/30/2010   hiatal hernia, fundic gland polyps   WRIST SURGERY     Left     Current Outpatient Medications  Medication Sig Dispense Refill   acetaminophen (TYLENOL) 325 MG tablet Take 1-2 tablets (325-650 mg total) by  mouth every 6 (six) hours as needed for mild pain or headache (For pain.).     amoxicillin (AMOXIL) 500 MG capsule TAKE 4 CAPSULES 1 HOUR BEFORE DENTAL TREATMENT (Patient not taking: No sig reported)     Ascorbic Acid (VITAMIN C PO) Take by mouth daily.     HYDROcodone-homatropine (HYCODAN) 5-1.5 MG/5ML syrup 1-2 tsp po tid prn cough (Patient not taking: Reported on 11/06/2020) 120 mL 0   ibuprofen (ADVIL) 800 MG tablet ibuprofen 800 mg tablet  Take 1 tablet every day by oral route at bedtime.     metoprolol succinate (TOPROL-XL) 100 MG 24 hr tablet TAKE 1 TABLET BY MOUTH EVERY DAY 90 tablet 3   Multiple Vitamin (MULTIVITAMIN) capsule Take 1 capsule by mouth daily.     Multiple Vitamins-Minerals (ZINC PO) Take by mouth daily.     VITAMIN D PO Take by mouth daily.     No current facility-administered medications for this visit.    Allergies as of 01/30/2021 - Review Complete 11/17/2020  Allergen Reaction Noted   Avelox [moxifloxacin hcl in nacl] Other (See Comments) 12/02/2011   Moxifloxacin hcl Other (See Comments) 12/11/2019    Family History  Problem Relation Age of Onset   Diabetes Mother    Hyperlipidemia  Mother    Liver disease Father    Obesity Father    Diabetes Maternal Grandfather    Diverticulitis Brother        x 2   Diverticulitis Paternal Grandmother    Colon cancer Neg Hx    Breast cancer Neg Hx     Social History   Socioeconomic History   Marital status: Married    Spouse name: Not on file   Number of children: 2   Years of education: Not on file   Highest education level: Not on file  Occupational History   Occupation: Medical case Freight forwarder    Comment: Workers Comp  Tobacco Use   Smoking status: Never   Smokeless tobacco: Never  Vaping Use   Vaping Use: Never used  Substance and Sexual Activity   Alcohol use: Yes    Comment: rare social   Drug use: No   Sexual activity: Yes    Comment: has periods q 3-4 months- no possiblity pregnant per pt.07-16-11  Other Topics Concern   Not on file  Social History Narrative   Updated 06/2015   Daily Caffeine    Pt lives in [place] with spouse and two children 28 and 7 yo in 2-16.  She works as a workers Dance movement psychotherapist.   Christian   No regular exercise, diet ok   Social Determinants of Radio broadcast assistant Strain: Not on file  Food Insecurity: Not on file  Transportation Needs: Not on file  Physical Activity: Not on file  Stress: Not on file  Social Connections: Not on file  Intimate Partner Violence: Not on file    Review of Systems:    Constitutional: No weight loss, fever or chills Skin: No rash  Cardiovascular: No chest pain Respiratory: No SOB  Gastrointestinal: See HPI and otherwise negative Genitourinary: No dysuria  Neurological: No headache Musculoskeletal: No new muscle or joint pain Hematologic: No bleeding  Psychiatric: No history of depression or anxiety   Physical Exam:  Vital signs: BP 126/70   Pulse 71   Ht 5\' 11"  (1.803 m)   Wt 236 lb 2 oz (107.1 kg)   LMP 04/17/2012   SpO2 98%   BMI 32.93 kg/m  Constitutional:   Pleasant Caucasian female appears to be in NAD,  Well developed, Well nourished, alert and cooperative Respiratory: Respirations even and unlabored. Lungs clear to auscultation bilaterally.   No wheezes, crackles, or rhonchi.  Cardiovascular: Normal S1, S2. No MRG. Regular rate and rhythm. No peripheral edema, cyanosis or pallor.  Gastrointestinal:  Soft, nondistended, nontender. No rebound or guarding. Normal bowel sounds. No appreciable masses or hepatomegaly. Rectal:  Not performed.  Psychiatric: Demonstrates good judgement and reason without abnormal affect or behaviors.  No recent labs or imaging.  Assessment: 1.  History of diverticulitis: 2 episodes which required antibiotics, the last 4 months ago per patient, better now 2.  Screening for colorectal cancer: Last colonoscopy November 2012, recommendations for repeat in 10 years  Plan: 1.  Discussed pathophysiology of diverticulitis with the patient today.  Recommendations are to maintain a high-fiber diet and regular stools in between episodes. 2.  Discussed that she did the right thing by backing down to a clear liquid diet for a few days when she was having discomfort recently, oftentimes patients can avoid antibiotics by doing this. 3.  Discussed that her next colonoscopy is due in November. 4.  Patient to follow in clinic with [place] as needed.  Ellouise Newer, PA-C [facility] 01/30/2021, 10:05 AM  Cc: Donnel Saxon, CNM

## 2021-01-30 NOTE — Patient Instructions (Signed)
If you are age 58 or older, your body mass index should be between 23-30. Your Body mass index is 32.93 kg/m. If this is out of the aforementioned range listed, please consider follow up with your Primary Care Provider.  If you are age 41 or younger, your body mass index should be between 19-25. Your Body mass index is 32.93 kg/m. If this is out of the aformentioned range listed, please consider follow up with your Primary Care Provider.   Follow up as needed.  The [facility] providers would like to encourage you to use [facility] to communicate with providers for non-urgent requests or questions.  Due to long hold times on the telephone, sending your provider a message by [facility] may be a faster and more efficient way to get a response.  Please allow 48 business hours for a response.  Please remember that this is for non-urgent requests.   Thank you for choosing me and [facility].  Ellouise Newer, PA-C

## 2021-02-10 ENCOUNTER — Ambulatory Visit (HOSPITAL_COMMUNITY)

## 2021-03-04 ENCOUNTER — Ambulatory Visit (HOSPITAL_COMMUNITY)
Admission: RE | Admit: 2021-03-04 | Discharge: 2021-03-04 | Disposition: A | Discharge status: Home / Self Care | Source: Ambulatory Visit | Attending: Sports Medicine | Admitting: Sports Medicine

## 2021-03-04 ENCOUNTER — Other Ambulatory Visit: Payer: Self-pay

## 2021-03-04 DIAGNOSIS — M25511 Pain in right shoulder: Secondary | ICD-10-CM | POA: Insufficient documentation

## 2021-03-04 NOTE — Progress Notes (Signed)
[facility] today at [facility] for [facility] right shoulder WO contrast. Patient has medtronic device. [facility] express sent, orders received for DOO 85

## 2021-03-04 NOTE — Progress Notes (Signed)
Informed of MRI for today.   Device system confirmed to be MRI conditional, with implant date > 6 weeks ago, and no evidence of abandoned or epicardial leads in review of most recent CXR Interrogation from today reviewed, pt is currently AP-VP at ~60 bpm Change device settings for MRI to DOO at 85 bpm  Tachy-therapies to off if applicable.  Program device back to pre-MRI settings after completion of exam.  Annamaria Helling  03/04/2021 1:06 PM

## 2021-04-03 ENCOUNTER — Ambulatory Visit (INDEPENDENT_AMBULATORY_CARE_PROVIDER_SITE_OTHER): Payer: 59

## 2021-04-03 DIAGNOSIS — I428 Other cardiomyopathies: Secondary | ICD-10-CM

## 2021-04-03 LAB — CUP PACEART REMOTE DEVICE CHECK
Battery Remaining Longevity: 57 mo
Battery Voltage: 2.95 V
Brady Statistic AP VP Percent: 97.59 %
Brady Statistic AP VS Percent: 1.67 %
Brady Statistic AS VP Percent: 0.2 %
Brady Statistic AS VS Percent: 0.54 %
Brady Statistic RA Percent Paced: 99.19 %
Brady Statistic RV Percent Paced: 55.36 %
Date Time Interrogation Session: 20220818080208
Implantable Lead Implant Date: 20171023
Implantable Lead Implant Date: 20171023
Implantable Lead Implant Date: 20171023
Implantable Lead Location: 753858
Implantable Lead Location: 753859
Implantable Lead Location: 753860
Implantable Lead Model: 4298
Implantable Lead Model: 5076
Implantable Lead Model: 5076
Implantable Pulse Generator Implant Date: 20171023
Lead Channel Impedance Value: 304 Ohm
Lead Channel Impedance Value: 361 Ohm
Lead Channel Impedance Value: 361 Ohm
Lead Channel Impedance Value: 399 Ohm
Lead Channel Impedance Value: 399 Ohm
Lead Channel Impedance Value: 418 Ohm
Lead Channel Impedance Value: 513 Ohm
Lead Channel Impedance Value: 627 Ohm
Lead Channel Impedance Value: 627 Ohm
Lead Channel Impedance Value: 646 Ohm
Lead Channel Impedance Value: 665 Ohm
Lead Channel Impedance Value: 665 Ohm
Lead Channel Impedance Value: 817 Ohm
Lead Channel Impedance Value: 874 Ohm
Lead Channel Pacing Threshold Amplitude: 0.5 V
Lead Channel Pacing Threshold Amplitude: 0.5 V
Lead Channel Pacing Threshold Amplitude: 1.25 V
Lead Channel Pacing Threshold Pulse Width: 0.4 ms
Lead Channel Pacing Threshold Pulse Width: 0.4 ms
Lead Channel Pacing Threshold Pulse Width: 0.4 ms
Lead Channel Sensing Intrinsic Amplitude: 1.625 mV
Lead Channel Sensing Intrinsic Amplitude: 1.625 mV
Lead Channel Sensing Intrinsic Amplitude: 12.875 mV
Lead Channel Sensing Intrinsic Amplitude: 12.875 mV
Lead Channel Setting Pacing Amplitude: 1.75 V
Lead Channel Setting Pacing Amplitude: 2 V
Lead Channel Setting Pacing Amplitude: 2.5 V
Lead Channel Setting Pacing Pulse Width: 0.4 ms
Lead Channel Setting Pacing Pulse Width: 0.4 ms
Lead Channel Setting Sensing Sensitivity: 2 mV

## 2021-04-19 ENCOUNTER — Other Ambulatory Visit: Payer: Self-pay | Admitting: Internal Medicine

## 2021-04-23 NOTE — Progress Notes (Signed)
Remote pacemaker transmission.   

## 2021-05-12 ENCOUNTER — Ambulatory Visit: Payer: 59 | Admitting: Dermatology

## 2021-05-21 ENCOUNTER — Other Ambulatory Visit: Payer: Self-pay | Admitting: Internal Medicine

## 2021-06-02 ENCOUNTER — Other Ambulatory Visit: Payer: Self-pay | Admitting: *Deleted

## 2021-06-02 ENCOUNTER — Telehealth: Payer: Self-pay | Admitting: Internal Medicine

## 2021-06-02 MED ORDER — METOPROLOL SUCCINATE ER 100 MG PO TB24: 100.0000 mg | ORAL_TABLET | Freq: Every day | ORAL | 2 refills | Status: DC

## 2021-06-02 NOTE — Telephone Encounter (Signed)
*  STAT* If patient is at the pharmacy, call can be transferred to refill team.   1. Which medications need to be refilled? (please list name of each medication and dose if known)  metoprolol succinate (TOPROL-XL) 100 MG 24 hr tablet  2. Which pharmacy/location (including street and city if local pharmacy) is medication to be sent to? [id number] - [place], [place] - [address]. AT [address]  3. Do they need a 30 day or 90 day supply? 30 with refills   Patient is scheduled to see DR. Allred 08/01/21 but will not have enough medication to last her until her appt

## 2021-07-03 ENCOUNTER — Ambulatory Visit (INDEPENDENT_AMBULATORY_CARE_PROVIDER_SITE_OTHER): Payer: 59

## 2021-07-03 DIAGNOSIS — I428 Other cardiomyopathies: Secondary | ICD-10-CM

## 2021-07-03 LAB — CUP PACEART REMOTE DEVICE CHECK
Battery Remaining Longevity: 53 mo
Battery Voltage: 2.95 V
Brady Statistic AP VP Percent: 98.14 %
Brady Statistic AP VS Percent: 1.68 %
Brady Statistic AS VP Percent: 0.06 %
Brady Statistic AS VS Percent: 0.12 %
Brady Statistic RA Percent Paced: 99.82 %
Brady Statistic RV Percent Paced: 60.47 %
Date Time Interrogation Session: 20221117001550
Implantable Lead Implant Date: 20171023
Implantable Lead Implant Date: 20171023
Implantable Lead Implant Date: 20171023
Implantable Lead Location: 753858
Implantable Lead Location: 753859
Implantable Lead Location: 753860
Implantable Lead Model: 4298
Implantable Lead Model: 5076
Implantable Lead Model: 5076
Implantable Pulse Generator Implant Date: 20171023
Lead Channel Impedance Value: 304 Ohm
Lead Channel Impedance Value: 361 Ohm
Lead Channel Impedance Value: 380 Ohm
Lead Channel Impedance Value: 399 Ohm
Lead Channel Impedance Value: 418 Ohm
Lead Channel Impedance Value: 456 Ohm
Lead Channel Impedance Value: 570 Ohm
Lead Channel Impedance Value: 665 Ohm
Lead Channel Impedance Value: 665 Ohm
Lead Channel Impedance Value: 684 Ohm
Lead Channel Impedance Value: 722 Ohm
Lead Channel Impedance Value: 741 Ohm
Lead Channel Impedance Value: 893 Ohm
Lead Channel Impedance Value: 931 Ohm
Lead Channel Pacing Threshold Amplitude: 0.5 V
Lead Channel Pacing Threshold Amplitude: 0.5 V
Lead Channel Pacing Threshold Amplitude: 1.125 V
Lead Channel Pacing Threshold Pulse Width: 0.4 ms
Lead Channel Pacing Threshold Pulse Width: 0.4 ms
Lead Channel Pacing Threshold Pulse Width: 0.4 ms
Lead Channel Sensing Intrinsic Amplitude: 1.375 mV
Lead Channel Sensing Intrinsic Amplitude: 1.375 mV
Lead Channel Sensing Intrinsic Amplitude: 12 mV
Lead Channel Sensing Intrinsic Amplitude: 12 mV
Lead Channel Setting Pacing Amplitude: 1.75 V
Lead Channel Setting Pacing Amplitude: 2 V
Lead Channel Setting Pacing Amplitude: 2.5 V
Lead Channel Setting Pacing Pulse Width: 0.4 ms
Lead Channel Setting Pacing Pulse Width: 0.4 ms
Lead Channel Setting Sensing Sensitivity: 2 mV

## 2021-07-14 NOTE — Progress Notes (Signed)
Remote pacemaker transmission.   

## 2021-08-01 ENCOUNTER — Encounter (HOSPITAL_BASED_OUTPATIENT_CLINIC_OR_DEPARTMENT_OTHER): Payer: Self-pay | Admitting: Internal Medicine

## 2021-08-01 ENCOUNTER — Other Ambulatory Visit: Payer: Self-pay

## 2021-08-01 ENCOUNTER — Ambulatory Visit (INDEPENDENT_AMBULATORY_CARE_PROVIDER_SITE_OTHER): Payer: 59 | Admitting: Internal Medicine

## 2021-08-01 VITALS — BP 130/74 | HR 80 | Ht 71.0 in | Wt 248.0 lb

## 2021-08-01 DIAGNOSIS — R001 Bradycardia, unspecified: Secondary | ICD-10-CM

## 2021-08-01 DIAGNOSIS — I4819 Other persistent atrial fibrillation: Secondary | ICD-10-CM

## 2021-08-01 DIAGNOSIS — I428 Other cardiomyopathies: Secondary | ICD-10-CM | POA: Diagnosis not present

## 2021-08-01 LAB — CUP PACEART INCLINIC DEVICE CHECK
Battery Remaining Longevity: 50 mo
Battery Voltage: 2.95 V
Brady Statistic AP VP Percent: 98.04 %
Brady Statistic AP VS Percent: 1.68 %
Brady Statistic AS VP Percent: 0.08 %
Brady Statistic AS VS Percent: 0.19 %
Brady Statistic RA Percent Paced: 99.72 %
Brady Statistic RV Percent Paced: 59.77 %
Date Time Interrogation Session: 20221216093911
Implantable Lead Implant Date: 20171023
Implantable Lead Implant Date: 20171023
Implantable Lead Implant Date: 20171023
Implantable Lead Location: 753858
Implantable Lead Location: 753859
Implantable Lead Location: 753860
Implantable Lead Model: 4298
Implantable Lead Model: 5076
Implantable Lead Model: 5076
Implantable Pulse Generator Implant Date: 20171023
Lead Channel Impedance Value: 304 Ohm
Lead Channel Impedance Value: 380 Ohm
Lead Channel Impedance Value: 380 Ohm
Lead Channel Impedance Value: 399 Ohm
Lead Channel Impedance Value: 418 Ohm
Lead Channel Impedance Value: 456 Ohm
Lead Channel Impedance Value: 570 Ohm
Lead Channel Impedance Value: 646 Ohm
Lead Channel Impedance Value: 684 Ohm
Lead Channel Impedance Value: 684 Ohm
Lead Channel Impedance Value: 722 Ohm
Lead Channel Impedance Value: 722 Ohm
Lead Channel Impedance Value: 893 Ohm
Lead Channel Impedance Value: 950 Ohm
Lead Channel Pacing Threshold Amplitude: 0.375 V
Lead Channel Pacing Threshold Amplitude: 0.5 V
Lead Channel Pacing Threshold Amplitude: 1.125 V
Lead Channel Pacing Threshold Pulse Width: 0.4 ms
Lead Channel Pacing Threshold Pulse Width: 0.4 ms
Lead Channel Pacing Threshold Pulse Width: 0.4 ms
Lead Channel Sensing Intrinsic Amplitude: 0.875 mV
Lead Channel Sensing Intrinsic Amplitude: 12.25 mV
Lead Channel Setting Pacing Amplitude: 1.75 V
Lead Channel Setting Pacing Amplitude: 2 V
Lead Channel Setting Pacing Amplitude: 2.5 V
Lead Channel Setting Pacing Pulse Width: 0.4 ms
Lead Channel Setting Pacing Pulse Width: 0.4 ms
Lead Channel Setting Sensing Sensitivity: 2 mV

## 2021-08-01 MED ORDER — METOPROLOL SUCCINATE ER 100 MG PO TB24
100.0000 mg | ORAL_TABLET | Freq: Every day | ORAL | 11 refills | Status: DC
Start: 2021-08-01 — End: 2022-08-04

## 2021-08-01 NOTE — Patient Instructions (Addendum)
Medication Instructions:  Your physician recommends that you continue on your current medications as directed. Please refer to the Current Medication list given to you today. *If you need a refill on your cardiac medications before your next appointment, please call your pharmacy*  Lab Work: None. If you have labs (blood work) drawn today and your tests are completely normal, you will receive your results only by: [place] (if you have MyChart) OR A paper copy in the mail If you have any lab test that is abnormal or we need to change your treatment, we will call you to review the results.  Testing/Procedures: None.  Follow-Up: At [facility], you and your health needs are our priority.  As part of our continuing mission to provide you with exceptional heart care, we have created designated Provider Care Teams.  These Care Teams include your primary Cardiologist (physician) and Advanced Practice Providers (APPs -  Physician Assistants and Nurse Practitioners) who all work together to provide you with the care you need, when you need it.  Your physician wants you to follow-up in: 12 months with    one of the following Advanced Practice Providers on your designated Care Team:    Legrand Como "Jonni Sanger" Chalmers Cater, [place]   You will receive a reminder letter in the mail two months in advance. If you don't receive a letter, please call our office to schedule the follow-up appointment.  Remote monitoring is used to monitor your Pacemaker from home. This monitoring reduces the number of office visits required to check your device to one time per year. It allows [place] to keep an eye on the functioning of your device to ensure it is working properly. You are scheduled for a device check from home on 10/02/21. You may send your transmission at any time that day. If you have a wireless device, the transmission will be sent automatically. After your physician reviews your transmission, you will receive a postcard  with your next transmission date.  We recommend signing up for the patient portal called "MyChart".  Sign up information is provided on this After Visit Summary.  MyChart is used to connect with patients for Virtual Visits (Telemedicine).  Patients are able to view lab/test results, encounter notes, upcoming appointments, etc.  Non-urgent messages can be sent to your provider as well.   To learn more about what you can do with MyChart, go to [email or web address].    Any Other Special Instructions Will Be Listed Below (If Applicable).

## 2021-08-01 NOTE — Progress Notes (Signed)
PCP: Donnel Saxon, CNM   [name] is a 60 y.o. female who presents today for routine electrophysiology followup.  Since last being seen in our clinic, the patient reports doing very well.  Today, she denies symptoms of palpitations, chest pain, shortness of breath,  lower extremity edema, dizziness, presyncope, or syncope.  The patient is otherwise without complaint today.   Past Medical History:  Diagnosis Date   Allergic rhinitis    Anxiety    when tachycardia occurs   Arthritis    OA, s/p numerous surgeries -back, knees, hip   Benign fundic gland polyps of stomach    Cervical disc disease    Diverticulitis    CT Scan    DVT (deep venous thrombosis) (HCC)    in pregnancy, s/p hip surgery   Eustachian tube dysfunction    Fatty liver    Gallbladder problem    GERD 12/23/2009   diet related-not a problem   Hemorrhoids    Hiatal hernia    small   History of MRSA infection 2008   superficial skin-cleared easily with doxycycline   Obesity    Peripheral vascular disease ([place])    left leg after hip surgery   Persistent atrial fibrillation ([place]) 03/05/2007   a. s/p RFCA 12/18/2011, 07/29/12   s/p atrial septal defect repair 05/28/2016   S/P Maze operation for atrial fibrillation 05/28/2016   Complete bilateral atrial lesion set using cryothermy and bipolar radiofrequency ablation with clipping of LA appendage via median sternotomy   Sialoadenitis    Tachy-brady syndrome ([place])    Past Surgical History:  Procedure Laterality Date   ASD REPAIR N/A 05/28/2016   Procedure: ATRIAL SEPTAL DEFECT (ASD) closure;  Surgeon: Rexene Alberts, MD;  Location: [place];  Service: Open Heart Surgery;  Laterality: N/A;   ATRIAL FIBRILLATION ABLATION N/A 12/18/2011   Procedure: ATRIAL FIBRILLATION ABLATION;  Surgeon: [name], MD;  Location: [facility] CATH LAB;  Service: Cardiovascular;  Laterality: N/A;   ATRIAL FIBRILLATION ABLATION N/A 07/29/2012   Procedure: ATRIAL  FIBRILLATION ABLATION;  Surgeon: [name], MD;  Location: [facility] CATH LAB;  Service: Cardiovascular;  Laterality: N/A;   CARDIAC CATHETERIZATION N/A 04/10/2016   Procedure: Right/Left Heart Cath and Coronary Angiography;  Surgeon: Jettie Booze, MD;  Location: [place] CV LAB;  Service: Cardiovascular;  Laterality: N/A;   CERVICAL FUSION     CESAREAN SECTION     x 2   CHOLECYSTECTOMY     CLIPPING OF ATRIAL APPENDAGE  05/28/2016   Procedure: CLIPPING OF ATRIAL APPENDAGE;  Surgeon: Rexene Alberts, MD;  Location: [place];  Service: Open Heart Surgery;;   COLONOSCOPY  07/16/2011   diverticulosis   ELBOW SURGERY     Right   EP IMPLANTABLE DEVICE N/A 09/10/2015   Procedure: Loop Recorder Insertion;  Surgeon: [name], MD;  Location: [place] CV LAB;  Service: Cardiovascular;  Laterality: N/A;   EP IMPLANTABLE DEVICE N/A 06/08/2016   MDT Marcelino Scot CRT-P implanted by Dr Curt Bears   FLEXIBLE SIGMOIDOSCOPY     [phone]   [place] Left 12/12/2012   Procedure: LEFT KNEE EXCISION SAPHENOUS NEUROMA/OPEN SCAR DEBRIDEMENT/POLY EXCHANGE/NERVE EXCISION;  Surgeon: Mauri Pole, MD;  [facility];  Service: Orthopedics;  Laterality: Left;   JOINT REPLACEMENT Bilateral    4'14  knees   KNEE SURGERY  x 9 Left Knee   MAZE  05/28/2016   Procedure: MAZE;  Surgeon: Rexene Alberts, MD;  Location: [place];  Service: Open Heart Surgery;;   MEDIASTERNOTOMY N/A 05/28/2016   Procedure: MEDIAN STERNOTOMY;  Surgeon: Rexene Alberts, MD;  Location: [place];  Service: Open Heart Surgery;  Laterality: N/A;   REDUCTION MAMMAPLASTY Bilateral    SHOULDER SURGERY     Right    TEE WITHOUT CARDIOVERSION  12/17/2011   Procedure: TRANSESOPHAGEAL ECHOCARDIOGRAM (TEE);  Surgeon: Larey Dresser, MD;  Location: [facility] ENDOSCOPY;  Service: Cardiovascular;  Laterality: N/A;   TEE WITHOUT CARDIOVERSION  07/28/2012   Procedure: TRANSESOPHAGEAL ECHOCARDIOGRAM (TEE);   Surgeon: Thayer Headings, MD;  Location: [place];  Service: Cardiovascular;  Laterality: N/A;   TEE WITHOUT CARDIOVERSION N/A 01/01/2016   Procedure: TRANSESOPHAGEAL ECHOCARDIOGRAM (TEE);  Surgeon: Sanda Klein, MD;  Location: [facility] ENDOSCOPY;  Service: Cardiovascular;  Laterality: N/A;   TEE WITHOUT CARDIOVERSION N/A 05/28/2016   Procedure: TRANSESOPHAGEAL ECHOCARDIOGRAM (TEE);  Surgeon: Rexene Alberts, MD;  Location: [place];  Service: Open Heart Surgery;  Laterality: N/A;   TOTAL HIP ARTHROPLASTY Left 10/17/2013   Procedure: LEFT TOTAL HIP ARTHROPLASTY ANTERIOR APPROACH;  Surgeon: Mauri Pole, MD;  [facility];  Service: Orthopedics;  Laterality: Left;   TOTAL KNEE ARTHROPLASTY Right 12/12/2012   Procedure: RIGHT TOTAL KNEE ARTHROPLASTY;  Surgeon: Mauri Pole, MD;  [facility];  Service: Orthopedics;  Laterality: Right;   TUBAL LIGATION     UPPER GASTROINTESTINAL ENDOSCOPY  01/30/2010   hiatal hernia, fundic gland polyps   WRIST SURGERY     Left     ROS- all systems are reviewed and negative except as per HPI above  Current Outpatient Medications  Medication Sig Dispense Refill   acetaminophen (TYLENOL) 325 MG tablet Take 1-2 tablets (325-650 mg total) by mouth every 6 (six) hours as needed for mild pain or headache (For pain.).     Ascorbic Acid (VITAMIN C PO) Take by mouth daily.     ibuprofen (ADVIL) 800 MG tablet ibuprofen 800 mg tablet  Take 1 tablet every day by oral route at bedtime.     metoprolol succinate (TOPROL-XL) 100 MG 24 hr tablet Take 1 tablet (100 mg total) by mouth daily. Pt needs to keep appt with provider for future refills 3rd attempt. 30 tablet 2   Multiple Vitamin (MULTIVITAMIN) capsule Take 1 capsule by mouth daily.     Multiple Vitamins-Minerals (ZINC PO) Take by mouth daily.     VITAMIN D PO Take by mouth daily.     No current facility-administered medications for this visit.    Physical Exam: Vitals:   08/01/21 0922  BP: 130/74  Pulse:  80  SpO2: 98%  Weight: 248 lb (112.5 kg)  Height: 5\' 11"  (1.803 m)    GEN- The patient is well appearing, alert and oriented x 3 today.   Head- normocephalic, atraumatic Eyes-  Sclera clear, conjunctiva pink Ears- hearing intact Oropharynx- clear Lungs-  normal work of breathing Chest- pacemaker pocket is well healed Heart- Regular rate and rhythm  GI- soft,  Extremities- no clubbing, cyanosis, or edema  Pacemaker interrogation- reviewed in detail today,  See PACEART report  ekg tracing ordered today is personally reviewed and shows AV paced  Echo 08/01/20- EF 55%, severe biatrial enlargement, mild MR  Assessment and Plan:  1. Symptomatic sinus bradycardia  Normal pacemaker function See Pace Art report No changes today she is not device  dependant today  2. Persistent afib Well controlled post ablation with LAA clip by Dr Roxy Manns Chads2vasc score is 1.  Does not require [place] at this time  3. Nonischemic CM  EF has normalized with CRT She had NSVT in October while playing pickle ball.  She had minor palpitations.  Continue to follow.  She is on toprol  4. Overweight Body mass index is 34.59 kg/m. Lifestyle modification is advised  Risks, benefits and potential toxicities for medications prescribed and/or refilled reviewed with patient today.   Return to see EP APP In a year  [name] MD, [facility] 08/01/2021 9:27 AM

## 2021-08-06 ENCOUNTER — Encounter: Payer: Self-pay | Admitting: Dermatology

## 2021-08-06 ENCOUNTER — Ambulatory Visit (INDEPENDENT_AMBULATORY_CARE_PROVIDER_SITE_OTHER): Payer: 59 | Admitting: Dermatology

## 2021-08-06 ENCOUNTER — Other Ambulatory Visit: Payer: Self-pay

## 2021-08-06 DIAGNOSIS — L821 Other seborrheic keratosis: Secondary | ICD-10-CM | POA: Diagnosis not present

## 2021-08-06 DIAGNOSIS — L570 Actinic keratosis: Secondary | ICD-10-CM

## 2021-08-06 DIAGNOSIS — L720 Epidermal cyst: Secondary | ICD-10-CM

## 2021-08-06 DIAGNOSIS — L920 Granuloma annulare: Secondary | ICD-10-CM

## 2021-08-17 HISTORY — PX: BREAST EXCISIONAL BIOPSY: SUR124

## 2021-09-01 ENCOUNTER — Telehealth: Payer: Self-pay | Admitting: Family Medicine

## 2021-09-01 NOTE — Telephone Encounter (Signed)
Patient calling in with respiratory symptoms: Shortness of breath, chest pain, palpitations or other red words send to Triage  Does the patient have a fever over 100, cough, congestion, sore throat, runny nose, lost of taste/smell (please list symptoms that patient has)? Sore throat, cough, ear clogged  What date did symptoms start?08-28-2021 (If over 5 days ago, pt may be scheduled for in person visit)  Have you tested for Covid in the last 5 days? Yes   If yes, was it positive []  OR negative [x] ? If positive in the last 5 days, please schedule virtual visit now. If negative, schedule for an in person OV with the next available provider if PCP has no openings. Please also let patient know they will be tested again (follow the script below)  "you will have to arrive 83mins prior to your appt time to be Covid tested. Please park in back of office at the cone & call [phone] to let the staff know you have arrived. A staff member will meet you at your car to do a rapid covid test. Once the test has resulted you will be notified by phone of your results to determine if appt will remain an in person visit or be converted to a virtual/phone visit. If you arrive less than 4mins before your appt time, your visit will be automatically converted to virtual & any recommended testing will happen AFTER the visit."  Pt has virtual with dr Maudie Mercury 09-02-2021 1 pm THINGS TO REMEMBER  If no availability for virtual visit in office,  please schedule another [place] office  If no availability at another [place] office, please instruct patient that they can schedule an evisit or virtual visit through their mychart account. Visits up to 8pm  patients can be seen in office 5 days after positive COVID test

## 2021-09-02 ENCOUNTER — Other Ambulatory Visit: Payer: Self-pay

## 2021-09-02 ENCOUNTER — Telehealth (INDEPENDENT_AMBULATORY_CARE_PROVIDER_SITE_OTHER): Payer: 59 | Admitting: Family Medicine

## 2021-09-02 DIAGNOSIS — R059 Cough, unspecified: Secondary | ICD-10-CM | POA: Diagnosis not present

## 2021-09-02 DIAGNOSIS — R0989 Other specified symptoms and signs involving the circulatory and respiratory systems: Secondary | ICD-10-CM

## 2021-09-02 MED ORDER — PROMETHAZINE-DM 6.25-15 MG/5ML PO SYRP: 5.0000 mL | ORAL_SOLUTION | Freq: Four times a day (QID) | ORAL | 0 refills | Status: DC | PRN

## 2021-09-02 MED ORDER — AZITHROMYCIN 250 MG PO TABS: ORAL_TABLET | ORAL | 0 refills | Status: DC

## 2021-09-02 NOTE — Progress Notes (Signed)
Virtual Visit via Video Note  I connected with Destiny Dennis  on 09/02/21 at  1:00 PM EST by a video enabled telemedicine application and verified that I am speaking with the correct person using two identifiers.  Location patient: [place] Location provider:work or home office Persons participating in the virtual visit: patient, provider  I discussed the limitations and requested verbal permission for telemedicine visit. The patient expressed understanding and agreed to proceed.   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  Past Medical History:  Diagnosis Date   Allergic rhinitis    Anxiety    when tachycardia occurs   Arthritis    OA, s/p numerous surgeries -back, knees, hip   Benign fundic gland polyps of stomach    Cervical disc disease    Diverticulitis    CT Scan    DVT (deep venous thrombosis) (HCC)    in pregnancy, s/p hip surgery   Eustachian tube dysfunction    Fatty liver    Gallbladder problem    GERD 12/23/2009   diet  related-not a problem   Hemorrhoids    Hiatal hernia    small   History of MRSA infection 2008   superficial skin-cleared easily with doxycycline   Obesity    Peripheral vascular disease ([place])    left leg after hip surgery   Persistent atrial fibrillation ([place]) 03/05/2007   a. s/p RFCA 12/18/2011, 07/29/12   s/p atrial septal defect repair 05/28/2016   S/P Maze operation for atrial fibrillation 05/28/2016   Complete bilateral atrial lesion set using cryothermy and bipolar radiofrequency ablation with clipping of LA appendage via median sternotomy   Sialoadenitis    Tachy-brady syndrome ([place])     Past Surgical History:  Procedure Laterality Date   ASD REPAIR N/A 05/28/2016   Procedure: ATRIAL SEPTAL DEFECT (ASD) closure;  Surgeon: Rexene Alberts, MD;  Location: [place];  Service: Open Heart Surgery;  Laterality: N/A;   ATRIAL FIBRILLATION ABLATION N/A 12/18/2011   Procedure: ATRIAL FIBRILLATION ABLATION;  Surgeon: [name], MD;  Location: [facility] CATH LAB;  Service: Cardiovascular;  Laterality: N/A;   ATRIAL FIBRILLATION ABLATION N/A 07/29/2012   Procedure: ATRIAL FIBRILLATION ABLATION;  Surgeon: [name], MD;  Location[facility] C[facility];  Service: Cardiovascular;  Laterality: N/A;   CARDIAC CATHETERIZATION N/A 04/10/2016   Procedure: Right/Left Heart Cath and Coronary Angiography;  Surgeon: Jettie Booze, MD;  Location: [place] CV LAB;  Service: Cardiovascular;  Laterality: N/A;   CERVICAL FUSION     CESAREAN SECTION     x 2   CHOLECYSTECTOMY     CLIPPING OF  ATRIAL APPENDAGE  05/28/2016   Procedure: CLIPPING OF ATRIAL APPENDAGE;  Surgeon: Rexene Alberts, MD;  Location: [place];  Service: Open Heart Surgery;;   COLONOSCOPY  07/16/2011   diverticulosis   ELBOW SURGERY     Right   EP IMPLANTABLE DEVICE N/A 09/10/2015   Procedure: Loop Recorder Insertion;  Surgeon: [name], MD;  Location: [place] CV LAB;  Service: Cardiovascular;  Laterality: N/A;   EP IMPLANTABLE DEVICE  N/A 06/08/2016   MDT Marcelino Scot CRT-P implanted by Dr Curt Bears   FLEXIBLE SIGMOIDOSCOPY     [phone]   [place] Left 12/12/2012   Procedure: LEFT KNEE EXCISION SAPHENOUS NEUROMA/OPEN SCAR DEBRIDEMENT/POLY EXCHANGE/NERVE EXCISION;  Surgeon: Mauri Pole, MD;  Location: [facility];  Service: Orthopedics;  Laterality: Left;   JOINT REPLACEMENT Bilateral    4'14  knees   KNEE SURGERY     x 9 Left Knee   MAZE  05/28/2016   Procedure: MAZE;  Surgeon: Rexene Alberts, MD;  Location: [place];  Service: Open Heart Surgery;;   MEDIASTERNOTOMY N/A 05/28/2016   Procedure: MEDIAN STERNOTOMY;  Surgeon: Rexene Alberts, MD;  Location: [place];  Service: Open Heart Surgery;  Laterality: N/A;   REDUCTION MAMMAPLASTY Bilateral    SHOULDER SURGERY     Right    TEE WITHOUT CARDIOVERSION  12/17/2011   Procedure: TRANSESOPHAGEAL ECHOCARDIOGRAM (TEE);  Surgeon: Larey Dresser, MD;  Location: [facility] ENDOSCOPY;  Service: Cardiovascular;  Laterality: N/A;   TEE WITHOUT CARDIOVERSION  07/28/2012   Procedure: TRANSESOPHAGEAL ECHOCARDIOGRAM (TEE);  Surgeon: Thayer Headings, MD;  Location: [place];  Service: Cardiovascular;  Laterality: N/A;   TEE WITHOUT CARDIOVERSION N/A 01/01/2016   Procedure: TRANSESOPHAGEAL ECHOCARDIOGRAM (TEE);  Surgeon: Sanda Klein, MD;  Location: [facility] ENDOSCOPY;  Service: Cardiovascular;  Laterality: N/A;   TEE WITHOUT CARDIOVERSION N/A 05/28/2016   Procedure: TRANSESOPHAGEAL ECHOCARDIOGRAM (TEE);  Surgeon: Rexene Alberts, MD;  Location: [place];  Service: Open Heart Surgery;  Laterality: N/A;   TOTAL HIP ARTHROPLASTY Left 10/17/2013   Procedure: LEFT TOTAL HIP ARTHROPLASTY ANTERIOR APPROACH;  Surgeon: Mauri Pole, MD;  Location: [facility];  Service: Orthopedics;  Laterality: Left;   TOTAL KNEE ARTHROPLASTY Right 12/12/2012   Procedure: RIGHT TOTAL KNEE ARTHROPLASTY;  Surgeon: Mauri Pole, MD;  Location: [facility];  Service: Orthopedics;  Laterality:  Right;   TUBAL LIGATION     UPPER GASTROINTESTINAL ENDOSCOPY  01/30/2010   hiatal hernia, fundic gland polyps   WRIST SURGERY     Left      Current Outpatient Medications:    azithromycin (ZITHROMAX) 250 MG tablet, 2 tabs day 1, then one tab daily, Disp: 6 tablet, Rfl: 0   promethazine-dextromethorphan (PROMETHAZINE-DM) 6.25-15 MG/5ML syrup, Take 5 mLs by mouth 4 (four) times daily as needed for cough., Disp: 118 mL, Rfl: 0   acetaminophen (TYLENOL) 325 MG tablet, Take 1-2 tablets (325-650 mg total) by mouth every 6 (six) hours as needed for mild pain or headache (For pain.)., Disp: , Rfl:    Ascorbic Acid (VITAMIN C PO), Take by mouth daily., Disp: , Rfl:    ibuprofen (ADVIL) 800 MG tablet, ibuprofen 800 mg tablet  Take 1 tablet every day by oral route at bedtime., Disp: , Rfl:    metoprolol succinate (TOPROL-XL) 100 MG 24 hr tablet, Take 1 tablet (100 mg total) by mouth daily., Disp: 30 tablet, Rfl: 11  Multiple Vitamin (MULTIVITAMIN) capsule, Take 1 capsule by mouth daily., Disp: , Rfl:    Multiple Vitamins-Minerals (ZINC PO), Take by mouth daily., Disp: , Rfl:    VITAMIN D PO, Take by mouth daily., Disp: , Rfl:   EXAM:  VITALS per patient if applicable:  GENERAL: alert, oriented, appears well and in no acute distress  HEENT: atraumatic, conjunttiva clear, no obvious abnormalities on inspection of external nose and ears  NECK: normal movements of the head and neck  LUNGS: on inspection no signs of respiratory distress, breathing rate appears normal, no obvious gross SOB, gasping or wheezing  CV: no obvious cyanosis  MS: moves all visible extremities without noticeable abnormality  PSYCH/NEURO: pleasant and cooperative, no obvious depression or anxiety, speech and thought processing grossly intact  ASSESSMENT AND PLAN:  Discussed the following assessment and plan:  Cough, unspecified type  Chest congestion  -we discussed possible serious and likely etiologies, options  for evaluation and workup, limitations of telemedicine visit vs in person visit, treatment, treatment risks and precautions. Pt is agreeable to treatment via telemedicine at this moment. Query VURI vs developing 2ndary bacterial resp illness vs other. She has opted to try a cough prescription, nasal saline, steam and initiation of abx if not improving. She prefers a zpack as feels it has worked well for her in the past.  Work/School slipped offered: provided in patient instructions   Advised to seek prompt virtual visit or in person care if worsening, new symptoms arise, or if is not improving with treatment as expected per our conversation of expected course. I discussed the assessment and treatment plan with the patient. The patient was provided an opportunity to ask questions and all were answered. The patient agreed with the plan and demonstrated an understanding of the instructions.     Lucretia Kern, DO

## 2021-09-02 NOTE — Patient Instructions (Addendum)
° ° ° °--------------------------------------------------------------------------------------------------------------------------- ° ° ° °  WORK SLIP:  Patient Destiny Dennis,  1961-04-07, was seen for a medical visit today, 09/02/21 . Please excuse from work for a COVID/flu like illness. Will defer to employer for return to work if patient has 2 negative covid tests 48 hours apart AND is feeling better, or if symptoms have resolved, it is greater than 5 days since the positive test and the patient can wear a high-quality, tight fitting mask such as N95 or KN95 at all times for an additional 5 days.   Sincerely: E-signature: Dr. Colin Benton, DO [place] Ph: [phone]   ------------------------------------------------------------------------------------------------------------------------------   -I sent the medication(s) we discussed to your pharmacy: Meds ordered this encounter  Medications   promethazine-dextromethorphan (PROMETHAZINE-DM) 6.25-15 MG/5ML syrup    Sig: Take 5 mLs by mouth 4 (four) times daily as needed for cough.    Dispense:  118 mL    Refill:  0   azithromycin (ZITHROMAX) 250 MG tablet    Sig: 2 tabs day 1, then one tab daily    Dispense:  6 tablet    Refill:  0   Nasal saline twice daily  Gargle warm salt water twice daily  Drink plenty of fluids and avoid dairy products  I hope you are feeling better soon!  Seek in person care promptly if your symptoms worsen, new concerns arise or you are not improving with treatment.  It was nice to meet you today. I help [name] out with telemedicine visits on Tuesdays and Thursdays and am happy to help if you need a virtual follow up visit on those days. Otherwise, if you have any concerns or questions following this visit please schedule a follow up visit with your Primary Care office or seek care at a local urgent care clinic to avoid delays in care

## 2021-09-03 ENCOUNTER — Encounter (HOSPITAL_BASED_OUTPATIENT_CLINIC_OR_DEPARTMENT_OTHER): Payer: Self-pay | Admitting: Emergency Medicine

## 2021-09-03 ENCOUNTER — Emergency Department (HOSPITAL_BASED_OUTPATIENT_CLINIC_OR_DEPARTMENT_OTHER): Payer: 59

## 2021-09-03 ENCOUNTER — Emergency Department (HOSPITAL_BASED_OUTPATIENT_CLINIC_OR_DEPARTMENT_OTHER)
Admission: EM | Admit: 2021-09-03 | Discharge: 2021-09-03 | Disposition: A | Discharge status: Home / Self Care | Payer: 59 | Attending: Emergency Medicine | Admitting: Emergency Medicine

## 2021-09-03 ENCOUNTER — Other Ambulatory Visit: Payer: Self-pay

## 2021-09-03 DIAGNOSIS — Z79899 Other long term (current) drug therapy: Secondary | ICD-10-CM | POA: Insufficient documentation

## 2021-09-03 DIAGNOSIS — Z20822 Contact with and (suspected) exposure to covid-19: Secondary | ICD-10-CM | POA: Insufficient documentation

## 2021-09-03 DIAGNOSIS — R0989 Other specified symptoms and signs involving the circulatory and respiratory systems: Secondary | ICD-10-CM | POA: Diagnosis not present

## 2021-09-03 DIAGNOSIS — R059 Cough, unspecified: Secondary | ICD-10-CM | POA: Diagnosis present

## 2021-09-03 DIAGNOSIS — J209 Acute bronchitis, unspecified: Secondary | ICD-10-CM

## 2021-09-03 LAB — RESP PANEL BY RT-PCR (FLU A&B, COVID) ARPGX2
Influenza A by PCR: NEGATIVE
Influenza B by PCR: NEGATIVE
SARS Coronavirus 2 by RT PCR: NEGATIVE

## 2021-09-03 MED ORDER — PREDNISONE 10 MG PO TABS: 20.0000 mg | ORAL_TABLET | Freq: Two times a day (BID) | ORAL | 0 refills | Status: DC

## 2021-09-03 MED ORDER — GUAIFENESIN-CODEINE 100-10 MG/5ML PO SOLN: 10.0000 mL | Freq: Four times a day (QID) | ORAL | 0 refills | Status: DC | PRN

## 2021-09-03 MED ORDER — GUAIFENESIN-CODEINE 100-10 MG/5ML PO SOLN
10.0000 mL | Freq: Once | ORAL | Status: AC
  Administered 2021-09-03: 10 mL via ORAL
  Filled 2021-09-03: qty 10

## 2021-09-03 MED ORDER — PREDNISONE 20 MG PO TABS
20.0000 mg | ORAL_TABLET | Freq: Once | ORAL | Status: AC
  Administered 2021-09-03: 20 mg via ORAL
  Filled 2021-09-03: qty 1

## 2021-09-03 NOTE — ED Triage Notes (Signed)
Presents for nonproductive cough x1 week, ear congestion, rhinorrhea, frontal HA. Denies SOB, CP, N/V. Was given phenergan and abx by PCP on virtual visit 2 days ago.  H/o frequent bronchitis.

## 2021-09-03 NOTE — ED Provider Notes (Signed)
[place] EMERGENCY DEPT Provider Note   CSN: [id number] Arrival date & time: 09/03/21  0344     History  Chief Complaint  Patient presents with   Cough    Destiny Dennis is a 61 y.o. female.  Patient is a 61 year old female with past medical history of atrial fibrillation, SVT.  Patient presenting today with complaints of chest congestion and cough.  This has been worsening over the past week.  She describes having difficulty sleeping secondary to cough.  Cough was productive of green sputum.  She denies fevers or chills.  She was seen by telemedicine 2 days ago.  She was prescribed Phenergan cough medication which has not helped.  She was also prescribed Zithromax which she was told to not take for 2 days and see if she improves on her own.  She has not begun taking this medication.  The history is provided by the patient.  Cough Cough characteristics:  Productive Sputum characteristics:  Green Severity:  Moderate Onset quality:  Gradual Duration:  1 week Timing:  Constant Progression:  Worsening Chronicity:  New Relieved by:  Nothing Worsened by:  Nothing Ineffective treatments:  Decongestant Associated symptoms: sinus congestion   Associated symptoms: no fever       Home Medications Prior to Admission medications   Medication Sig Start Date End Date Taking? Authorizing Provider  acetaminophen (TYLENOL) 325 MG tablet Take 1-2 tablets (325-650 mg total) by mouth every 6 (six) hours as needed for mild pain or headache (For pain.). 06/09/16   Nani Skillern, PA-C  Ascorbic Acid (VITAMIN C PO) Take by mouth daily.    [provider]  azithromycin (ZITHROMAX) 250 MG tablet 2 tabs day 1, then one tab daily 09/02/21   Lucretia Kern, DO  ibuprofen (ADVIL) 800 MG tablet ibuprofen 800 mg tablet  Take 1 tablet every day by oral route at bedtime.    [provider]  metoprolol succinate (TOPROL-XL) 100 MG 24 hr tablet Take 1 tablet (100 mg  total) by mouth daily. 08/01/21   Allred, Jeneen Rinks, MD  Multiple Vitamin (MULTIVITAMIN) capsule Take 1 capsule by mouth daily.    [provider]  Multiple Vitamins-Minerals (ZINC PO) Take by mouth daily.    [provider]  promethazine-dextromethorphan (PROMETHAZINE-DM) 6.25-15 MG/5ML syrup Take 5 mLs by mouth 4 (four) times daily as needed for cough. 09/02/21   Lucretia Kern, DO  VITAMIN D PO Take by mouth daily.    [provider]  flecainide (TAMBOCOR) 100 MG tablet Take 1 tablet (100 mg total) by mouth 2 (two) times daily. 09/17/11 11/09/11  Lelon Perla, MD      Allergies    Avelox [moxifloxacin hcl in nacl] and Moxifloxacin hcl    Review of Systems   Review of Systems  Constitutional:  Negative for fever.  Respiratory:  Positive for cough.   All other systems reviewed and are negative.  Physical Exam Updated Vital Signs BP (!) 148/75 (BP Location: Right Arm)    Pulse 67    Temp 98.4 F (36.9 C) (Oral)    Resp 18    Wt 108 kg    LMP 04/17/2012    SpO2 100%    BMI 33.19 kg/m  Physical Exam Vitals and nursing note reviewed.  Constitutional:      General: She is not in acute distress.    Appearance: She is well-developed. She is not diaphoretic.  HENT:     Head: Normocephalic and atraumatic.  Cardiovascular:     Rate and Rhythm: Normal rate and regular rhythm.     Heart sounds: No murmur heard.   No friction rub. No gallop.  Pulmonary:     Effort: Pulmonary effort is normal. No respiratory distress.     Breath sounds: Normal breath sounds. No wheezing.  Abdominal:     General: Bowel sounds are normal. There is no distension.     Palpations: Abdomen is soft.     Tenderness: There is no abdominal tenderness.  Musculoskeletal:        General: Normal range of motion.     Cervical back: Normal range of motion and neck supple.  Skin:    General: Skin is warm and dry.  Neurological:     General: No focal deficit present.     Mental Status: She is  alert and oriented to person, place, and time.    ED Results / Procedures / Treatments   Labs (all labs ordered are listed, but only abnormal results are displayed) Labs Reviewed  RESP PANEL BY RT-PCR (FLU A&B, COVID) [id number]    EKG None  Radiology No results found.  Procedures Procedures    Medications Ordered in ED Medications  guaiFENesin-codeine 100-10 MG/5ML solution 10 mL (has no administration in time range)    ED Course/ Medical Decision Making/ A&P  Patient is a 61 year old female presenting with complaints of cough.  Symptoms have been worsening over the past week to the point where she has difficulty sleeping.  She was prescribed Zithromax 2 days ago, but just filled the prescription and has not started taking the medicine.  Patient's symptoms are most consistent with bronchitis.  I will recommend she take the Zithromax.  I will add prednisone and Robitussin with codeine.  Chest x-ray today is clear and COVID test is negative.  Final Clinical Impression(s) / ED Diagnoses Final diagnoses:  None    Rx / DC Orders ED Discharge Orders     None         Veryl Speak, MD 09/03/21 [phone]

## 2021-09-03 NOTE — ED Notes (Signed)
Pt reporting a decrease in cough

## 2021-09-03 NOTE — Discharge Instructions (Addendum)
Begin taking Zithromax as previously prescribed.  Begin taking prednisone as prescribed and Robitussin with codeine as prescribed as needed for cough.  Return to the emergency department if you develop severe chest pain, high fever, worsening breathing, or other new and concerning symptoms.

## 2021-09-05 ENCOUNTER — Encounter: Payer: Self-pay | Admitting: Dermatology

## 2021-09-05 NOTE — Progress Notes (Signed)
° °  Follow-Up Visit   Subjective  Destiny Dennis is a 61 y.o. female who presents for the following: Follow-up (Left temple- completely gone- Ln2 last office visit).  Recheck treatment site forehead, rash on arms and, check other spots Location:  Duration:  Quality:  Associated Signs/Symptoms: Modifying Factors:  Severity:  Timing: Context:   Objective  Well appearing patient in no apparent distress; mood and affect are within normal limits. Torso - Posterior (Back) Multiple textured brown flattopped papules   Right Temporal Scalp Area now smooth with slight residual erythema   Right Lower Back Noninflamed deep dermal nodule  Right Upper Arm - Posterior Patch of arciform  pink 3 mm dermal papules    All skin waist up examined.  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      I, Lavonna Monarch, MD, have reviewed all documentation for this visit.  The documentation on 09/05/21 for the exam, diagnosis, procedures, and orders are all accurate and complete.

## 2021-09-18 ENCOUNTER — Encounter: Payer: Self-pay | Admitting: Internal Medicine

## 2021-09-23 ENCOUNTER — Encounter: Payer: Self-pay | Admitting: Internal Medicine

## 2021-10-02 ENCOUNTER — Ambulatory Visit (INDEPENDENT_AMBULATORY_CARE_PROVIDER_SITE_OTHER): Payer: 59

## 2021-10-02 DIAGNOSIS — I4819 Other persistent atrial fibrillation: Secondary | ICD-10-CM | POA: Diagnosis not present

## 2021-10-02 LAB — CUP PACEART REMOTE DEVICE CHECK
Battery Remaining Longevity: 48 mo
Battery Voltage: 2.95 V
Brady Statistic AP VP Percent: 98.16 %
Brady Statistic AP VS Percent: 1.68 %
Brady Statistic AS VP Percent: 0.06 %
Brady Statistic AS VS Percent: 0.1 %
Brady Statistic RA Percent Paced: 99.87 %
Brady Statistic RV Percent Paced: 51.45 %
Date Time Interrogation Session: 20230216094933
Implantable Lead Implant Date: 20171023
Implantable Lead Implant Date: 20171023
Implantable Lead Implant Date: 20171023
Implantable Lead Location: 753858
Implantable Lead Location: 753859
Implantable Lead Location: 753860
Implantable Lead Model: 4298
Implantable Lead Model: 5076
Implantable Lead Model: 5076
Implantable Pulse Generator Implant Date: 20171023
Lead Channel Impedance Value: 304 Ohm
Lead Channel Impedance Value: 342 Ohm
Lead Channel Impedance Value: 361 Ohm
Lead Channel Impedance Value: 380 Ohm
Lead Channel Impedance Value: 399 Ohm
Lead Channel Impedance Value: 475 Ohm
Lead Channel Impedance Value: 570 Ohm
Lead Channel Impedance Value: 627 Ohm
Lead Channel Impedance Value: 646 Ohm
Lead Channel Impedance Value: 665 Ohm
Lead Channel Impedance Value: 703 Ohm
Lead Channel Impedance Value: 741 Ohm
Lead Channel Impedance Value: 874 Ohm
Lead Channel Impedance Value: 912 Ohm
Lead Channel Pacing Threshold Amplitude: 0.375 V
Lead Channel Pacing Threshold Amplitude: 0.5 V
Lead Channel Pacing Threshold Amplitude: 1.125 V
Lead Channel Pacing Threshold Pulse Width: 0.4 ms
Lead Channel Pacing Threshold Pulse Width: 0.4 ms
Lead Channel Pacing Threshold Pulse Width: 0.4 ms
Lead Channel Sensing Intrinsic Amplitude: 14.625 mV
Lead Channel Sensing Intrinsic Amplitude: 14.625 mV
Lead Channel Sensing Intrinsic Amplitude: 2 mV
Lead Channel Sensing Intrinsic Amplitude: 2 mV
Lead Channel Setting Pacing Amplitude: 1.75 V
Lead Channel Setting Pacing Amplitude: 2 V
Lead Channel Setting Pacing Amplitude: 2.5 V
Lead Channel Setting Pacing Pulse Width: 0.4 ms
Lead Channel Setting Pacing Pulse Width: 0.4 ms
Lead Channel Setting Sensing Sensitivity: 2 mV

## 2021-10-07 NOTE — Progress Notes (Signed)
Remote pacemaker transmission.   

## 2021-10-08 ENCOUNTER — Telehealth: Payer: Self-pay | Admitting: *Deleted

## 2021-10-08 NOTE — Telephone Encounter (Signed)
Dr. Carlean Purl will you please review chart and advise if pt. Needs office visit prior to procedure on 11/06/21  Thank you

## 2021-10-08 NOTE — Telephone Encounter (Signed)
I do not see a reason that she needs a visit in office

## 2021-10-23 ENCOUNTER — Ambulatory Visit (AMBULATORY_SURGERY_CENTER): Payer: 59 | Admitting: *Deleted

## 2021-10-23 ENCOUNTER — Other Ambulatory Visit: Payer: Self-pay

## 2021-10-23 VITALS — Ht 71.0 in | Wt 236.0 lb

## 2021-10-23 DIAGNOSIS — Z1211 Encounter for screening for malignant neoplasm of colon: Secondary | ICD-10-CM

## 2021-10-23 NOTE — Progress Notes (Signed)
Patient's pre-visit was done today over the phone with the patient. Name,DOB and address verified. Patient denies any allergies to Eggs and Soy. Patient denies any problems with anesthesia/sedation. Patient is not taking any diet pills or blood thinners. No home Oxygen.  ? ?Prep instructions sent to pt's MyChart-pt aware. Patient understands to call us back with any questions or concerns. Patient is aware of our care-partner policy and [id number] safety protocol.  ? ?EMMI education assigned to the patient for the procedure, sent to [place].  ? ?

## 2021-10-24 NOTE — Progress Notes (Incomplete)
Phone [phone] In person visit   Subjective:   Destiny Dennis is a 61 y.o. year old very pleasant female patient who presents for/with See problem oriented charting No chief complaint on file.   This visit occurred during the SARS-CoV-2 public health emergency.  Safety protocols were in place, including screening questions prior to the visit, additional usage of staff PPE, and extensive cleaning of exam room while observing appropriate contact time as indicated for disinfecting solutions.   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   Leg length discrepancy 04/29/2017   Other constipation 04/27/2017   Vitamin D deficiency 04/27/2017   Essential hypertension 01/18/2017   Obesity (BMI 35.0-39.9 without comorbidity) 01/18/2017   Tachy-brady syndrome ([place])    S/P Maze operation for atrial fibrillation + closure ASD 05/28/2016   s/p atrial septal defect repair 05/28/2016   Cardiomyopathy ([place])    Atrial septal defect 01/01/2016   Persistent atrial fibrillation ([place])    Arthritis, 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     Ascorbic Acid (VITAMIN C PO) Take by mouth daily. And Zinc po     Barberry-Oreg Grape-Goldenseal (BERBERINE COMPLEX PO) Take by mouth.     ibuprofen (ADVIL) 800 MG tablet ibuprofen 800 mg tablet  Take 1 tablet every day by oral route at bedtime.      metoprolol succinate (TOPROL-XL) 100 MG 24 hr tablet Take 1 tablet (100 mg total) by mouth daily. 30 tablet 11   Multiple Vitamin (MULTIVITAMIN) capsule Take 1 capsule by mouth daily.     TURMERIC PO Take by mouth.     UNABLE TO FIND Med Name: Banaba po     VITAMIN D PO Take 10,000 Units by mouth daily.     No current facility-administered medications for this visit.     Objective:  LMP 04/17/2012  Gen: NAD, resting comfortably CV: RRR no murmurs rubs or gallops Lungs: CTAB no crackles, wheeze, rhonchi Abdomen: soft/nontender/nondistended/normal bowel sounds. No rebound or guarding.  Ext: no edema Skin: warm, dry Neuro: grossly normal, moves all extremities  ***    Assessment and Plan   #hypertension S: medication: metoprolol 100 mg  Home readings #s: *** BP Readings from Last 3 Encounters:  09/03/21 136/63  08/01/21 130/74  01/30/21 126/70  A/P: ***  Health Maintenance Due  Topic Date Due   COVID-19 Vaccine (1) Never done   HIV Screening  Never done   Hepatitis C Screening  Never done   Zoster Vaccines- Shingrix (1 of 2) Never done   PAP SMEAR-Modifier  09/29/2019   INFLUENZA VACCINE  03/17/2021   COLONOSCOPY (Pts 45-63yr Insurance coverage will need to be confirmed)  07/15/2021   Recommended follow up: No follow-ups on file. Future Appointments  Date Time Provider [place] 11/06/2021 11:30 AM GGatha Mayer MD [facility]  12/05/2021 10:40 AM HMarin Olp MD [facility] 01/01/2022  7:20 AM [facility] DEVICE REMOTES  [facility]  04/02/2022  7:20 AM [facility] DEVICE REMOTES [facility]  07/02/2022  7:20 AM [facility] DEVICE REMOTES [facility]  08/18/2022  7:45 AM Lavonna Monarch, MD [facility]  10/01/2022  7:20 AM [facility] DEVICE REMOTES [facility]  12/31/2022  7:20 AM [facility] DEVICE REMOTES [facility]  04/01/2023  7:20 AM [facility] DEVICE REMOTES [facility]  [facility]    Lab/Order associations: No diagnosis found.  No orders of the defined types were placed in this encounter.   Return precautions advised.  Burnett Corrente

## 2021-10-29 ENCOUNTER — Encounter: Payer: Self-pay | Admitting: Internal Medicine

## 2021-11-06 ENCOUNTER — Encounter: Payer: 59 | Admitting: Internal Medicine

## 2021-11-06 ENCOUNTER — Other Ambulatory Visit: Payer: Self-pay | Admitting: Obstetrics and Gynecology

## 2021-11-06 DIAGNOSIS — Z1231 Encounter for screening mammogram for malignant neoplasm of breast: Secondary | ICD-10-CM

## 2021-12-03 ENCOUNTER — Encounter: Payer: Self-pay | Admitting: Certified Registered Nurse Anesthetist

## 2021-12-05 ENCOUNTER — Ambulatory Visit (INDEPENDENT_AMBULATORY_CARE_PROVIDER_SITE_OTHER): Payer: 59 | Admitting: Family Medicine

## 2021-12-05 ENCOUNTER — Encounter: Payer: Self-pay | Admitting: Family Medicine

## 2021-12-05 VITALS — BP 118/72 | HR 73 | Temp 98.3°F | Ht 71.0 in | Wt 247.8 lb

## 2021-12-05 DIAGNOSIS — Z114 Encounter for screening for human immunodeficiency virus [HIV]: Secondary | ICD-10-CM

## 2021-12-05 DIAGNOSIS — M159 Polyosteoarthritis, unspecified: Secondary | ICD-10-CM

## 2021-12-05 DIAGNOSIS — I429 Cardiomyopathy, unspecified: Secondary | ICD-10-CM | POA: Diagnosis not present

## 2021-12-05 DIAGNOSIS — Z1159 Encounter for screening for other viral diseases: Secondary | ICD-10-CM

## 2021-12-05 DIAGNOSIS — I10 Essential (primary) hypertension: Secondary | ICD-10-CM

## 2021-12-05 DIAGNOSIS — I4891 Unspecified atrial fibrillation: Secondary | ICD-10-CM

## 2021-12-05 DIAGNOSIS — E559 Vitamin D deficiency, unspecified: Secondary | ICD-10-CM

## 2021-12-05 DIAGNOSIS — I471 Supraventricular tachycardia, unspecified: Secondary | ICD-10-CM

## 2021-12-05 DIAGNOSIS — E785 Hyperlipidemia, unspecified: Secondary | ICD-10-CM | POA: Diagnosis not present

## 2021-12-05 DIAGNOSIS — Z86718 Personal history of other venous thrombosis and embolism: Secondary | ICD-10-CM

## 2021-12-05 DIAGNOSIS — K760 Fatty (change of) liver, not elsewhere classified: Secondary | ICD-10-CM | POA: Diagnosis not present

## 2021-12-05 DIAGNOSIS — Z950 Presence of cardiac pacemaker: Secondary | ICD-10-CM

## 2021-12-05 NOTE — Patient Instructions (Addendum)
Thanks for doing colonoscopy next week!  ? ?Schedule a lab visit at the check out desk within 2 weeks. Return for future fasting labs meaning nothing but water after midnight please. Ok to take your medications with water.  ? ?I would strongly consider following with [facility]- I think you have some level of tear in the groin. Also would advise to rest if possible ? ?Recommended follow up: Return in about 6 months (around 06/06/2022) for physical or sooner if needed.Schedule b4 you leave.  ?

## 2021-12-09 ENCOUNTER — Telehealth: Payer: Self-pay | Admitting: Internal Medicine

## 2021-12-09 NOTE — Telephone Encounter (Signed)
Pt. Stated 'I had a cheeseburger with tomatoes and  on a bun with sesame seeds and wanted to know if I was still okay to do procedure on Friday",instructed pt to drink plenty of fluids to flush colon out and just try and be careful until she has procedure,verbalized understanding. ?

## 2021-12-09 NOTE — Telephone Encounter (Signed)
Patient has a colonoscopy on Friday and ate some things she shouldn't.  Please call patient and advise if she can still do the procedure.  Thank you. ?

## 2021-12-12 ENCOUNTER — Ambulatory Visit (AMBULATORY_SURGERY_CENTER): Payer: 59 | Admitting: Internal Medicine

## 2021-12-12 ENCOUNTER — Encounter: Payer: Self-pay | Admitting: Internal Medicine

## 2021-12-12 VITALS — BP 118/60 | HR 60 | Temp 97.5°F | Resp 19 | Ht 71.0 in | Wt 236.0 lb

## 2021-12-12 DIAGNOSIS — D128 Benign neoplasm of rectum: Secondary | ICD-10-CM | POA: Diagnosis not present

## 2021-12-12 DIAGNOSIS — D123 Benign neoplasm of transverse colon: Secondary | ICD-10-CM

## 2021-12-12 DIAGNOSIS — Z1211 Encounter for screening for malignant neoplasm of colon: Secondary | ICD-10-CM

## 2021-12-12 MED ORDER — SODIUM CHLORIDE 0.9 % IV SOLN: 500.0000 mL | INTRAVENOUS | Status: DC

## 2021-12-12 NOTE — Progress Notes (Signed)
[facility] History and Physical ? ? ?Primary Care Physician:  Marin Olp, MD ? ? ?Reason for Procedure:   CRCA screen ? ?Plan:    colonoscopy ? ? ? ? ?HPI: CERA RORKE is a 61 y.o. female here for screening colonoscopy ? ? ?Past Medical History:  ?Diagnosis Date  ? Allergic rhinitis   ? Anxiety   ? when tachycardia occurs  ? Arthritis   ? OA, s/p numerous surgeries -back, knees, hip  ? Benign fundic gland polyps of stomach   ? Cervical disc disease   ? Diverticulitis   ? CT Scan   ? DVT (deep venous thrombosis) (HCC)   ? in pregnancy, s/p hip surgery  ? Fatty liver   ? GERD 12/23/2009  ? diet related-not a problem  ? Hemorrhoids   ? Hiatal hernia   ? small  ? History of MRSA infection 2008  ? superficial skin-cleared easily with doxycycline  ? Obesity   ? Persistent atrial fibrillation ([place]) 03/05/2007  ? a. s/p RFCA 12/18/2011, 07/29/12  ? Post-operative nausea and vomiting   ? s/p atrial septal defect repair 05/28/2016  ? S/P Maze operation for atrial fibrillation 05/28/2016  ? Complete bilateral atrial lesion set using cryothermy and bipolar radiofrequency ablation with clipping of LA appendage via median sternotomy  ? Sialoadenitis   ? Tachy-brady syndrome ([place])   ? ? ?Past Surgical History:  ?Procedure Laterality Date  ? ASD REPAIR N/A 05/28/2016  ? Procedure: ATRIAL SEPTAL DEFECT (ASD) closure;  Surgeon: Rexene Alberts, MD;  Location: [facility];  Service: Open Heart Surgery;  Laterality: N/A;  ? ATRIAL FIBRILLATION ABLATION N/A 12/18/2011  ? Procedure: ATRIAL FIBRILLATION ABLATION;  Surgeon: [name], MD;  Location: [facility] CATH LAB;  Service: Cardiovascular;  Laterality: N/A;  ? ATRIAL FIBRILLATION ABLATION N/A 07/29/2012  ? Procedure: ATRIAL FIBRILLATION ABLATION;  Surgeon: [name], MD;  Location: [facility] CATH LAB;  Service: Cardiovascular;  Laterality: N/A;  ? CARDIAC CATHETERIZATION N/A 04/10/2016  ? Procedure: Right/Left Heart Cath and Coronary Angiography;  Surgeon: Jettie Booze,  MD;  Location: [place] CV LAB;  Service: Cardiovascular;  Laterality: N/A;  ? CERVICAL FUSION    ? CESAREAN SECTION    ? x 2  ? CHOLECYSTECTOMY    ? CLIPPING OF ATRIAL APPENDAGE  05/28/2016  ? Procedure: CLIPPING OF ATRIAL APPENDAGE;  Surgeon: Rexene Alberts, MD;  Location: [place];  Service: Open Heart Surgery;;  ? COLONOSCOPY  07/16/2011  ? diverticulosis  ? ELBOW SURGERY    ? Right  ? EP IMPLANTABLE DEVICE N/A 09/10/2015  ? Procedure: Loop Recorder Insertion;  Surgeon: [name], MD;  Location: [place] CV LAB;  Service: Cardiovascular;  Laterality: N/A;  ? EP IMPLANTABLE DEVICE N/A 06/08/2016  ? MDT Marcelino Scot CRT-P implanted by Dr Curt Bears  ? FLEXIBLE SIGMOIDOSCOPY    ? 1990's  ? FOOT SURGERY    ? Right   ? I & D KNEE WITH POLY EXCHANGE Left 12/12/2012  ? Procedure: LEFT KNEE EXCISION SAPHENOUS NEUROMA/OPEN SCAR DEBRIDEMENT/POLY EXCHANGE/NERVE EXCISION;  Surgeon: Mauri Pole, MD;  [facility]: [facility];  Service: Orthopedics;  Laterality: Left;  ? JOINT REPLACEMENT Bilateral   ? 4'14  knees  ? KNEE SURGERY    ? x 9 Left Knee  ? MAZE  05/28/2016  ? Procedure: MAZE;  Surgeon: Rexene Alberts, MD;  Location: [place];  Service: Open Heart Surgery;;  ? MEDIASTERNOTOMY N/A 05/28/2016  ? Procedure: MEDIAN STERNOTOMY;  Surgeon: Rexene Alberts, MD;  Location: [place];  Service: Open Heart Surgery;  Laterality: N/A;  ? REDUCTION MAMMAPLASTY Bilateral   ? SHOULDER SURGERY    ? Right   ? TEE WITHOUT CARDIOVERSION  12/17/2011  ? Procedure: TRANSESOPHAGEAL ECHOCARDIOGRAM (TEE);  Surgeon: Larey Dresser, MD;  Location: [place];  Service: Cardiovascular;  Laterality: N/A;  ? TEE WITHOUT CARDIOVERSION  07/28/2012  ? Procedure: TRANSESOPHAGEAL ECHOCARDIOGRAM (TEE);  Surgeon: Thayer Headings, MD;  Location: [place];  Service: Cardiovascular;  Laterality: N/A;  ? TEE WITHOUT CARDIOVERSION N/A 01/01/2016  ? Procedure: TRANSESOPHAGEAL ECHOCARDIOGRAM (TEE);  Surgeon: Sanda Klein, MD;  Location: [place];  Service:  Cardiovascular;  Laterality: N/A;  ? TEE WITHOUT CARDIOVERSION N/A 05/28/2016  ? Procedure: TRANSESOPHAGEAL ECHOCARDIOGRAM (TEE);  Surgeon: Rexene Alberts, MD;  Location: [place];  Service: Open Heart Surgery;  Laterality: N/A;  ? TOTAL HIP ARTHROPLASTY Left 10/17/2013  ? Procedure: LEFT TOTAL HIP ARTHROPLASTY ANTERIOR APPROACH;  Surgeon: Mauri Pole, MD;  [facility];  Service: Orthopedics;  Laterality: Left;  ? TOTAL KNEE ARTHROPLASTY Right 12/12/2012  ? Procedure: RIGHT TOTAL KNEE ARTHROPLASTY;  Surgeon: Mauri Pole, MD;  [facility];  Service: Orthopedics;  Laterality: Right;  ? TUBAL LIGATION    ? UPPER GASTROINTESTINAL ENDOSCOPY  01/30/2010  ? hiatal hernia, fundic gland polyps  ? WRIST SURGERY    ? Left   ? ? ?Prior to Admission medications   ?Medication Sig Start Date End Date Taking? Authorizing Provider  ?Ascorbic Acid (VITAMIN C PO) Take by mouth daily. And Zinc po   Yes [provider]  ?Barberry-Oreg Grape-Goldenseal (BERBERINE COMPLEX PO) Take by mouth.   Yes [provider]  ?ibuprofen (ADVIL) 800 MG tablet ibuprofen 800 mg tablet ? Take 1 tablet every day by oral route at bedtime.   Yes [provider]  ?metoprolol succinate (TOPROL-XL) 100 MG 24 hr tablet Take 1 tablet (100 mg total) by mouth daily. 08/01/21  Yes Allred, Jeneen Rinks, MD  ?Multiple Vitamin (MULTIVITAMIN) capsule Take 1 capsule by mouth daily.   Yes [provider]  ?TURMERIC PO Take by mouth.   Yes [provider]  ?UNABLE TO FIND Med Name: Banaba po   Yes [provider]  ?VITAMIN D PO Take 10,000 Units by mouth daily.   Yes [provider]  ?acetaminophen (TYLENOL) 500 MG tablet Take 1,000 mg by mouth at bedtime.    [provider]  ?flecainide (TAMBOCOR) 100 MG tablet Take 1 tablet (100 mg total) by mouth 2 (two) times daily. 09/17/11 11/09/11  Lelon Perla, MD  ? ? ?Current Outpatient Medications  ?Medication Sig Dispense Refill  ? Ascorbic Acid  (VITAMIN C PO) Take by mouth daily. And Zinc po    ? Barberry-Oreg Grape-Goldenseal (BERBERINE COMPLEX PO) Take by mouth.    ? ibuprofen (ADVIL) 800 MG tablet ibuprofen 800 mg tablet ? Take 1 tablet every day by oral route at bedtime.    ? metoprolol succinate (TOPROL-XL) 100 MG 24 hr tablet Take 1 tablet (100 mg total) by mouth daily. 30 tablet 11  ? Multiple Vitamin (MULTIVITAMIN) capsule Take 1 capsule by mouth daily.    ? TURMERIC PO Take by mouth.    ? UNABLE TO FIND Med Name: Banaba po    ? VITAMIN D PO Take 10,000 Units by mouth daily.    ? acetaminophen (TYLENOL) 500 MG tablet Take 1,000 mg by mouth at bedtime.    ? ?  Current Facility-Administered Medications  ?Medication Dose Route Frequency Provider Last Rate Last Admin  ? 0.9 %  sodium chloride infusion  500 mL Intravenous Continuous Gatha Mayer, MD      ? ? ?Allergies as of 12/12/2021 - Review Complete 12/12/2021  ?Allergen Reaction Noted  ? Avelox [moxifloxacin hcl in nacl] Other (See Comments) 12/02/2011  ? Moxifloxacin hcl Other (See Comments) 12/11/2019  ? ? ?Family History  ?Problem Relation Age of Onset  ? Diabetes Mother   ? Hyperlipidemia Mother   ? Other Mother   ?     covid complications- 84  ? Liver disease Father   ? Obesity Father   ? Other Father   ?     covid complications- 63  ? Diverticulitis Brother   ?     x 2  ? Atrial fibrillation Brother   ? Arthritis Brother   ?     2 knee replacements  ? Healthy Brother   ?     doesnt see doctor much  ? Diabetes Maternal Grandfather   ? Cervical cancer Maternal Grandfather   ? Diverticulitis Paternal Grandmother   ? Colon cancer Neg Hx   ? Breast cancer Neg Hx   ? Esophageal cancer Neg Hx   ? Rectal cancer Neg Hx   ? Stomach cancer Neg Hx   ? Colon polyps Neg Hx   ? ? ?Social History  ? ?Socioeconomic History  ? Marital status: Married  ?  Spouse name: Not on file  ? Number of children: 2  ? Years of education: Not on file  ? Highest education level: Not on file  ?Occupational History  ?  Occupation: Medical case Freight forwarder  ?  Comment: Workers Comp  ?Tobacco Use  ? Smoking status: Never  ? Smokeless tobacco: Never  ?Vaping Use  ? Vaping Use: Never used  ?Substance and Sexual Activity  ? Alcohol use

## 2021-12-12 NOTE — Progress Notes (Signed)
Called to room to assist during endoscopic procedure.  Patient ID and intended procedure confirmed with present staff. Received instructions for my participation in the procedure from the performing physician.  

## 2021-12-12 NOTE — Progress Notes (Signed)
Report given to PACU, vss 

## 2021-12-12 NOTE — Progress Notes (Signed)
Pt's states no medical or surgical changes since previsit or office visit. 

## 2021-12-12 NOTE — Patient Instructions (Addendum)
I found and removed 2 tiny polyps that look benign. ? ?You still have diverticulosis - thickened muscle rings and pouches in the colon wall. Please read the handout about this condition. ? ?I will let you know pathology results and when to have another routine colonoscopy by mail and/or My Chart. ? ?I appreciate the opportunity to care for you. ?Gatha Mayer, MD, [facility] ? ?Handouts given for diverticulosis and polyps.  Resume previous diet and medications.   ?YOU HAD AN ENDOSCOPIC PROCEDURE TODAY AT [facility]:   Refer to the procedure report that was given to you for any specific questions about what was found during the examination.  If the procedure report does not answer your questions, please call your gastroenterologist to clarify.  If you requested that your care partner not be given the details of your procedure findings, then the procedure report has been included in a sealed envelope for you to review at your convenience later. ? ?YOU SHOULD EXPECT: Some feelings of bloating in the abdomen. Passage of more gas than usual.  Walking can help get rid of the air that was put into your GI tract during the procedure and reduce the bloating. If you had a lower endoscopy (such as a colonoscopy or flexible sigmoidoscopy) you may notice spotting of blood in your stool or on the toilet paper. If you underwent a bowel prep for your procedure, you may not have a normal bowel movement for a few days. ? ?Please Note:  You might notice some irritation and congestion in your nose or some drainage.  This is from the oxygen used during your procedure.  There is no need for concern and it should clear up in a day or so. ? ?SYMPTOMS TO REPORT IMMEDIATELY: ? ?Following lower endoscopy (colonoscopy or flexible sigmoidoscopy): ? Excessive amounts of blood in the stool ? Significant tenderness or worsening of abdominal pains ? Swelling of the abdomen that is new, acute ? Fever of 100?F or higher ? ?For urgent or  emergent issues, a gastroenterologist can be reached at any hour by calling [phone]. ?Do not use MyChart messaging for urgent concerns.  ? ? ?DIET:  We do recommend a small meal at first, but then you may proceed to your regular diet.  Drink plenty of fluids but you should avoid alcoholic beverages for 24 hours. ? ?ACTIVITY:  You should plan to take it easy for the rest of today and you should NOT DRIVE or use heavy machinery until tomorrow (because of the sedation medicines used during the test).   ? ?FOLLOW UP: ?Our staff will call the number listed on your records 48-72 hours following your procedure to check on you and address any questions or concerns that you may have regarding the information given to you following your procedure. If we do not reach you, we will leave a message.  We will attempt to reach you two times.  During this call, we will ask if you have developed any symptoms of COVID 19. If you develop any symptoms (ie: fever, flu-like symptoms, shortness of breath, cough etc.) before then, please call [phone].  If you test positive for Covid 19 in the 2 weeks post procedure, please call and report this information to [place].   ? ?If any biopsies were taken you will be contacted by phone or by letter within the next 1-3 weeks.  Please call us at [phone] if you have not heard about the biopsies in 3  weeks.  ? ? ?SIGNATURES/CONFIDENTIALITY: ?You and/or your care partner have signed paperwork which will be entered into your electronic medical record.  These signatures attest to the fact that that the information above on your After Visit Summary has been reviewed and is understood.  Full responsibility of the confidentiality of this discharge information lies with you and/or your care-partner.  ?

## 2021-12-12 NOTE — Op Note (Signed)
[place] ?Patient Name: Destiny Dennis ?Procedure Date: 12/12/2021 10:39 AM ?MRN: [id number] ?Endoscopist: Gatha Mayer , MD ?Age: 61 ?Referring MD:  ?Date of Birth: 1961/03/28 ?Gender: Female ?Account #: [id number] ?Procedure:                Colonoscopy ?Indications:              Screening for colorectal malignant neoplasm, Last  ?                          colonoscopy: 2012 ?Medicines:                Monitored Anesthesia Care ?Procedure:                Pre-Anesthesia Assessment: ?                          - Prior to the procedure, a History and Physical  ?                          was performed, and patient medications and  ?                          allergies were reviewed. The patient's tolerance of  ?                          previous anesthesia was also reviewed. The risks  ?                          and benefits of the procedure and the sedation  ?                          options and risks were discussed with the patient.  ?                          All questions were answered, and informed consent  ?                          was obtained. Prior Anticoagulants: The patient has  ?                          taken no previous anticoagulant or antiplatelet  ?                          agents. ASA Grade Assessment: III - A patient with  ?                          severe systemic disease. After reviewing the risks  ?                          and benefits, the patient was deemed in  ?                          satisfactory condition to undergo the procedure. ?  After obtaining informed consent, the colonoscope  ?                          was passed under direct vision. Throughout the  ?                          procedure, the patient's blood pressure, pulse, and  ?                          oxygen saturations were monitored continuously. The  ?                          Olympus CF-HQ190L Colonscope was introduced through  ?                          the anus and advanced to the the  cecum, identified  ?                          by appendiceal orifice and ileocecal valve. The  ?                          colonoscopy was performed without difficulty. The  ?                          patient tolerated the procedure well. The quality  ?                          of the bowel preparation was good. The ileocecal  ?                          valve, appendiceal orifice, and rectum were  ?                          photographed. The bowel preparation used was  ?                          Miralax via split dose instruction. ?Scope In: 10:48:12 AM ?Scope Out: 11:05:43 AM ?Scope Withdrawal Time: 0 hours 13 minutes 3 seconds  ?Total Procedure Duration: 0 hours 17 minutes 31 seconds  ?Findings:                 The perianal and digital rectal examinations were  ?                          normal. ?                          Two sessile polyps were found in the rectum and  ?                          transverse colon. The polyps were diminutive in  ?                          size. These polyps were removed with a cold snare.  ?  Resection and retrieval were complete. Verification  ?                          of patient identification for the specimen was  ?                          done. Estimated blood loss was minimal. ?                          Multiple diverticula were found in the sigmoid  ?                          colon, descending colon and ascending colon. ?                          The exam was otherwise without abnormality on  ?                          direct and retroflexion views. ?Complications:            No immediate complications. ?Estimated Blood Loss:     Estimated blood loss was minimal. ?Impression:               - Two diminutive polyps in the rectum and in the  ?                          transverse colon, removed with a cold snare.  ?                          Resected and retrieved. ?                          - Diverticulosis in the sigmoid colon, in the  ?                           descending colon and in the ascending colon. ?                          - The examination was otherwise normal on direct  ?                          and retroflexion views. ?Recommendation:           - Patient has a contact number available for  ?                          emergencies. The signs and symptoms of potential  ?                          delayed complications were discussed with the  ?                          patient. Return to normal activities tomorrow.  ?                          Written discharge  instructions were provided to the  ?                          patient. ?                          - Resume previous diet. ?                          - Continue present medications. ?                          - Await pathology results. ?                          - Repeat colonoscopy is recommended. The  ?                          colonoscopy date will be determined after pathology  ?                          results from today's exam become available for  ?                          review. ?Gatha Mayer, MD ?12/12/2021 11:14:00 AM ?This report has been signed electronically. ?

## 2021-12-16 ENCOUNTER — Ambulatory Visit
Admission: RE | Admit: 2021-12-16 | Discharge: 2021-12-16 | Disposition: A | Discharge status: Home / Self Care | Payer: 59 | Source: Ambulatory Visit | Attending: Obstetrics and Gynecology | Admitting: Obstetrics and Gynecology

## 2021-12-16 ENCOUNTER — Telehealth: Payer: Self-pay

## 2021-12-16 DIAGNOSIS — Z1231 Encounter for screening mammogram for malignant neoplasm of breast: Secondary | ICD-10-CM

## 2021-12-16 NOTE — Telephone Encounter (Signed)
First post procedure follow up call, no answer 

## 2021-12-16 NOTE — Telephone Encounter (Signed)
No answer second post procedure follow up call. ?

## 2021-12-18 ENCOUNTER — Other Ambulatory Visit: Payer: Self-pay | Admitting: Obstetrics and Gynecology

## 2021-12-18 DIAGNOSIS — R928 Other abnormal and inconclusive findings on diagnostic imaging of breast: Secondary | ICD-10-CM

## 2021-12-20 ENCOUNTER — Ambulatory Visit
Admission: RE | Admit: 2021-12-20 | Discharge: 2021-12-20 | Disposition: A | Discharge status: Home / Self Care | Payer: 59 | Source: Ambulatory Visit | Attending: Obstetrics and Gynecology | Admitting: Obstetrics and Gynecology

## 2021-12-20 ENCOUNTER — Other Ambulatory Visit: Payer: Self-pay | Admitting: Obstetrics and Gynecology

## 2021-12-20 DIAGNOSIS — R921 Mammographic calcification found on diagnostic imaging of breast: Secondary | ICD-10-CM

## 2021-12-20 DIAGNOSIS — R928 Other abnormal and inconclusive findings on diagnostic imaging of breast: Secondary | ICD-10-CM

## 2021-12-21 ENCOUNTER — Encounter: Payer: Self-pay | Admitting: Internal Medicine

## 2021-12-21 DIAGNOSIS — Z86010 Personal history of colonic polyps: Secondary | ICD-10-CM | POA: Insufficient documentation

## 2021-12-21 DIAGNOSIS — Z860101 Personal history of adenomatous and serrated colon polyps: Secondary | ICD-10-CM | POA: Insufficient documentation

## 2021-12-21 HISTORY — DX: Personal history of colonic polyps: Z86.010

## 2021-12-21 HISTORY — DX: Personal history of adenomatous and serrated colon polyps: Z86.0101

## 2021-12-31 ENCOUNTER — Ambulatory Visit
Admission: RE | Admit: 2021-12-31 | Discharge: 2021-12-31 | Disposition: A | Discharge status: Home / Self Care | Payer: 59 | Source: Ambulatory Visit | Attending: Obstetrics and Gynecology | Admitting: Obstetrics and Gynecology

## 2021-12-31 DIAGNOSIS — R921 Mammographic calcification found on diagnostic imaging of breast: Secondary | ICD-10-CM

## 2022-01-01 ENCOUNTER — Ambulatory Visit (INDEPENDENT_AMBULATORY_CARE_PROVIDER_SITE_OTHER): Payer: 59

## 2022-01-01 DIAGNOSIS — I495 Sick sinus syndrome: Secondary | ICD-10-CM

## 2022-01-02 LAB — CUP PACEART REMOTE DEVICE CHECK
Battery Remaining Longevity: 45 mo
Battery Voltage: 2.94 V
Brady Statistic AP VP Percent: 98.21 %
Brady Statistic AP VS Percent: 1.66 %
Brady Statistic AS VP Percent: 0.06 %
Brady Statistic AS VS Percent: 0.07 %
Brady Statistic RA Percent Paced: 99.89 %
Brady Statistic RV Percent Paced: 63.09 %
Date Time Interrogation Session: 20230518060707
Implantable Lead Implant Date: 20171023
Implantable Lead Implant Date: 20171023
Implantable Lead Implant Date: 20171023
Implantable Lead Location: 753858
Implantable Lead Location: 753859
Implantable Lead Location: 753860
Implantable Lead Model: 4298
Implantable Lead Model: 5076
Implantable Lead Model: 5076
Implantable Pulse Generator Implant Date: 20171023
Lead Channel Impedance Value: 304 Ohm
Lead Channel Impedance Value: 361 Ohm
Lead Channel Impedance Value: 361 Ohm
Lead Channel Impedance Value: 380 Ohm
Lead Channel Impedance Value: 399 Ohm
Lead Channel Impedance Value: 456 Ohm
Lead Channel Impedance Value: 532 Ohm
Lead Channel Impedance Value: 608 Ohm
Lead Channel Impedance Value: 627 Ohm
Lead Channel Impedance Value: 646 Ohm
Lead Channel Impedance Value: 684 Ohm
Lead Channel Impedance Value: 722 Ohm
Lead Channel Impedance Value: 798 Ohm
Lead Channel Impedance Value: 836 Ohm
Lead Channel Pacing Threshold Amplitude: 0.375 V
Lead Channel Pacing Threshold Amplitude: 0.5 V
Lead Channel Pacing Threshold Amplitude: 1.25 V
Lead Channel Pacing Threshold Pulse Width: 0.4 ms
Lead Channel Pacing Threshold Pulse Width: 0.4 ms
Lead Channel Pacing Threshold Pulse Width: 0.4 ms
Lead Channel Sensing Intrinsic Amplitude: 1.75 mV
Lead Channel Sensing Intrinsic Amplitude: 1.75 mV
Lead Channel Sensing Intrinsic Amplitude: 10.75 mV
Lead Channel Sensing Intrinsic Amplitude: 10.75 mV
Lead Channel Setting Pacing Amplitude: 1.75 V
Lead Channel Setting Pacing Amplitude: 2 V
Lead Channel Setting Pacing Amplitude: 2.5 V
Lead Channel Setting Pacing Pulse Width: 0.4 ms
Lead Channel Setting Pacing Pulse Width: 0.4 ms
Lead Channel Setting Sensing Sensitivity: 2 mV

## 2022-01-13 NOTE — Progress Notes (Signed)
Remote pacemaker transmission.   

## 2022-01-15 ENCOUNTER — Other Ambulatory Visit: Payer: 59

## 2022-01-16 ENCOUNTER — Other Ambulatory Visit: Payer: 59

## 2022-01-21 ENCOUNTER — Other Ambulatory Visit: Payer: Self-pay | Admitting: General Surgery

## 2022-01-21 ENCOUNTER — Telehealth: Payer: Self-pay

## 2022-01-21 DIAGNOSIS — N6091 Unspecified benign mammary dysplasia of right breast: Secondary | ICD-10-CM

## 2022-01-21 NOTE — Telephone Encounter (Signed)
   Pre-operative Risk Assessment    Patient Name: Destiny Dennis  DOB: August 20, 1960 MRN: [id number]      Request for Surgical Clearance    Procedure:   Right breast seed guided excisional biopsy surgery  Date of Surgery:  Clearance TBD                                 Surgeon:  Pryor Ochoa, MD Surgeon's Group or Practice Name:  [facility]  Phone number:  [phone] Triage nurse Fax number:  [phone]   Type of Clearance Requested:   - Medical  - Pharmacy:  Hold Should patient hold any medications preoperatively?      Type of Anesthesia:  General    Additional requests/questions:    Signed, Persephanie Laatsch   01/21/2022, 2:29 PM

## 2022-01-22 ENCOUNTER — Other Ambulatory Visit: Payer: 59

## 2022-01-22 ENCOUNTER — Telehealth: Payer: Self-pay | Admitting: *Deleted

## 2022-01-22 NOTE — Telephone Encounter (Signed)
Patient was returning call. Please advise ?

## 2022-01-22 NOTE — Telephone Encounter (Signed)
I s/w the pt and she is agreeable to plan of care for tele pre op appt 01/26/22 @ 11:40. Med rec and consent are done.

## 2022-01-22 NOTE — Telephone Encounter (Signed)
    Name: Destiny Dennis  DOB: 1960/10/09  MRN: [id number]  Primary Cardiologist: Dr. Rayann Heman (EP)   Preoperative team, please contact this patient and set up a phone call appointment for further preoperative risk assessment. Please obtain consent and complete medication review. Thank you for your help.  I confirm that guidance regarding antiplatelet and oral anticoagulation therapy has been completed and, if necessary, noted below.   Lenna Sciara, NP 01/22/2022, 1:42 PM [place] [address] [place], [place] [address]

## 2022-01-22 NOTE — Telephone Encounter (Signed)
I s/w the pt and she is agreeable to plan of care for tele pre op appt 01/26/22 @ 11:40. Med rec and consent are done.     Patient Consent for Virtual Visit        MADISEN LUDVIGSEN has provided verbal consent on 01/22/2022 for a virtual visit (video or telephone).   CONSENT FOR VIRTUAL VISIT FOR:  Alcario Drought Boxer  By participating in this virtual visit I agree to the following:  I hereby voluntarily request, consent and authorize [facility] and its employed or contracted physicians, physician assistants, nurse practitioners or other licensed health care professionals (the Practitioner), to provide me with telemedicine health care services (the "Services") as deemed necessary by the treating Practitioner. I acknowledge and consent to receive the Services by the Practitioner via telemedicine. I understand that the telemedicine visit will involve communicating with the Practitioner through live audiovisual communication technology and the disclosure of certain medical information by electronic transmission. I acknowledge that I have been given the opportunity to request an in-person assessment or other available alternative prior to the telemedicine visit and am voluntarily participating in the telemedicine visit.  I understand that I have the right to withhold or withdraw my consent to the use of telemedicine in the course of my care at any time, without affecting my right to future care or treatment, and that the Practitioner or I may terminate the telemedicine visit at any time. I understand that I have the right to inspect all information obtained and/or recorded in the course of the telemedicine visit and may receive copies of available information for a reasonable fee.  I understand that some of the potential risks of receiving the Services via telemedicine include:  Delay or interruption in medical evaluation due to technological equipment failure or disruption; Information transmitted may  not be sufficient (e.g. poor resolution of images) to allow for appropriate medical decision making by the Practitioner; and/or  In rare instances, security protocols could fail, causing a breach of personal health information.  Furthermore, I acknowledge that it is my responsibility to provide information about my medical history, conditions and care that is complete and accurate to the best of my ability. I acknowledge that Practitioner's advice, recommendations, and/or decision may be based on factors not within their control, such as incomplete or inaccurate data provided by me or distortions of diagnostic images or specimens that may result from electronic transmissions. I understand that the practice of medicine is not an exact science and that Practitioner makes no warranties or guarantees regarding treatment outcomes. I acknowledge that a copy of this consent can be made available to me via my patient portal ([facility]), or I can request a printed copy by calling the office of [facility].    I understand that my insurance will be billed for this visit.   I have read or had this consent read to me. I understand the contents of this consent, which adequately explains the benefits and risks of the Services being provided via telemedicine.  I have been provided ample opportunity to ask questions regarding this consent and the Services and have had my questions answered to my satisfaction. I give my informed consent for the services to be provided through the use of telemedicine in my medical care

## 2022-01-22 NOTE — Telephone Encounter (Signed)
Left message for the pt to call back and schedule a tele pre op appt.  

## 2022-01-26 ENCOUNTER — Encounter: Payer: Self-pay | Admitting: Nurse Practitioner

## 2022-01-26 ENCOUNTER — Ambulatory Visit (INDEPENDENT_AMBULATORY_CARE_PROVIDER_SITE_OTHER): Payer: 59 | Admitting: Nurse Practitioner

## 2022-01-26 DIAGNOSIS — Z01810 Encounter for preprocedural cardiovascular examination: Secondary | ICD-10-CM | POA: Diagnosis not present

## 2022-01-26 NOTE — Progress Notes (Signed)
Virtual Visit via Telephone Note   Because of Destiny Dennis's co-morbid illnesses, she is at least at moderate risk for complications without adequate follow up.  This format is felt to be most appropriate for this patient at this time.  The patient did not have access to video technology/had technical difficulties with video requiring transitioning to audio format only (telephone).  All issues noted in this document were discussed and addressed.  No physical exam could be performed with this format.  Please refer to the patient's chart for her consent to telehealth for [facility].  Evaluation Performed:  Preoperative cardiovascular risk assessment _____________   Date:  01/26/2022   Patient ID:  Destiny Dennis, DOB Mar 15, 1961, MRN [id number] Patient Location:  Home Provider location:   Office  Primary Care Provider:  Marin Olp, MD Primary Cardiologist:  None  Chief Complaint / Patient Profile   61 y.o. y/o female with a h/o ASD repair 2017, persistent atrial fibrillation s/p ablation 2013, LAA clip, NICM, symptomatic sinus bradycardia s/p PPM, who is pending right breast seed guided excisional biopsy and presents today for telephonic preoperative cardiovascular risk assessment.  Past Medical History    Past Medical History:  Diagnosis Date   Allergic rhinitis    Anxiety    when tachycardia occurs   Arthritis    OA, s/p numerous surgeries -back, knees, hip   Benign fundic gland polyps of stomach    Cervical disc disease    Diverticulitis    CT Scan    DVT (deep venous thrombosis) ([place])    in pregnancy, s/p hip surgery   Fatty liver    GERD 12/23/2009   diet related-not a problem   Hemorrhoids    Hiatal hernia    small   History of MRSA infection 2008   superficial skin-cleared easily with doxycycline   Hx of adenomatous polyp of colon 12/21/2021   Obesity    Persistent atrial fibrillation ([place]) 03/05/2007   a. s/p RFCA 12/18/2011, 07/29/12    Post-operative nausea and vomiting    s/p atrial septal defect repair 05/28/2016   S/P Maze operation for atrial fibrillation 05/28/2016   Complete bilateral atrial lesion set using cryothermy and bipolar radiofrequency ablation with clipping of LA appendage via median sternotomy   Sialoadenitis    Tachy-brady syndrome ([place])    Past Surgical History:  Procedure Laterality Date   ASD REPAIR N/A 05/28/2016   Procedure: ATRIAL SEPTAL DEFECT (ASD) closure;  Surgeon: Rexene Alberts, MD;  Location: [place];  Service: Open Heart Surgery;  Laterality: N/A;   ATRIAL FIBRILLATION ABLATION N/A 12/18/2011   Procedure: ATRIAL FIBRILLATION ABLATION;  Surgeon: [name], MD;  Location: [facility] CATH LAB;  Service: Cardiovascular;  Laterality: N/A;   ATRIAL FIBRILLATION ABLATION N/A 07/29/2012   Procedure: ATRIAL FIBRILLATION ABLATION;  Surgeon: [name], MD;  Location[facility] CATH LAB;  Service: Cardiovascular;  Laterality: N/A;   CARDIAC CATHETERIZATION N/A 04/10/2016   Procedure: Right/Left Heart Cath and Coronary Angiography;  Surgeon: Jettie Booze, MD;  Location: [place] CV LAB;  Service: Cardiovascular;  Laterality: N/A;   CERVICAL FUSION     CESAREAN SECTION     x 2   CHOLECYSTECTOMY     CLIPPING OF ATRIAL APPENDAGE  05/28/2016   Procedure: CLIPPING OF ATRIAL APPENDAGE;  Surgeon: Rexene Alberts, MD;  Location: [place];  Service: Open Heart Surgery;;   COLONOSCOPY  07/16/2011   diverticulosis   ELBOW SURGERY  Right   EP IMPLANTABLE DEVICE N/A 09/10/2015   Procedure: Loop Recorder Insertion;  Surgeon: [name], MD;  Location: [facility] CV LAB;  Service: Cardiovascular;  Laterality: N/A;   EP IMPLANTABLE DEVICE N/A 06/08/2016   MDT Marcelino Scot CRT-P implanted by Dr Curt Bears   FLEXIBLE SIGMOIDOSCOPY     [phone]   [place] Left 12/12/2012   Procedure: LEFT KNEE EXCISION SAPHENOUS NEUROMA/OPEN SCAR DEBRIDEMENT/POLY EXCHANGE/NERVE EXCISION;   Surgeon: Mauri Pole, MD;  Location: [facility];  Service: Orthopedics;  Laterality: Left;   JOINT REPLACEMENT Bilateral    4'14  knees   KNEE SURGERY     x 9 Left Knee   MAZE  05/28/2016   Procedure: MAZE;  Surgeon: Rexene Alberts, MD;  Location: [place];  Service: Open Heart Surgery;;   MEDIASTERNOTOMY N/A 05/28/2016   Procedure: MEDIAN STERNOTOMY;  Surgeon: Rexene Alberts, MD;  Location: [place];  Service: Open Heart Surgery;  Laterality: N/A;   REDUCTION MAMMAPLASTY Bilateral    SHOULDER SURGERY     Right    TEE WITHOUT CARDIOVERSION  12/17/2011   Procedure: TRANSESOPHAGEAL ECHOCARDIOGRAM (TEE);  Surgeon: Larey Dresser, MD;  Location: [facility] ENDOSCOPY;  Service: Cardiovascular;  Laterality: N/A;   TEE WITHOUT CARDIOVERSION  07/28/2012   Procedure: TRANSESOPHAGEAL ECHOCARDIOGRAM (TEE);  Surgeon: Thayer Headings, MD;  Location: [facility];  Service: Cardiovascular;  Laterality: N/A;   TEE WITHOUT CARDIOVERSION N/A 01/01/2016   Procedure: TRANSESOPHAGEAL ECHOCARDIOGRAM (TEE);  Surgeon: Sanda Klein, MD;  Location: [facility] ENDOSCOPY;  Service: Cardiovascular;  Laterality: N/A;   TEE WITHOUT CARDIOVERSION N/A 05/28/2016   Procedure: TRANSESOPHAGEAL ECHOCARDIOGRAM (TEE);  Surgeon: Rexene Alberts, MD;  Location: [facility];  Service: Open Heart Surgery;  Laterality: N/A;   TOTAL HIP ARTHROPLASTY Left 10/17/2013   Procedure: LEFT TOTAL HIP ARTHROPLASTY ANTERIOR APPROACH;  Surgeon: Mauri Pole, MD;  Location: [facility];  Service: Orthopedics;  Laterality: Left;   TOTAL KNEE ARTHROPLASTY Right 12/12/2012   Procedure: RIGHT TOTAL KNEE ARTHROPLASTY;  Surgeon: Mauri Pole, MD;  Location: [facility];  Service: Orthopedics;  Laterality: Right;   TUBAL LIGATION     UPPER GASTROINTESTINAL ENDOSCOPY  01/30/2010   hiatal hernia, fundic gland polyps   WRIST SURGERY     Left     Allergies  Allergies  Allergen Reactions   Avelox [Moxifloxacin Hcl In Nacl] Other (See Comments)    Numbness & tingling Peripheral  neuropathy   Moxifloxacin Hcl Other (See Comments)    History of Present Illness    Destiny Dennis is a 61 y.o. female who presents via audio/video conferencing for a telehealth visit today.  Pt was last seen in cardiology clinic on 08/01/21 by Dr. Rayann Heman.  At that time Destiny Dennis was doing well .  The patient is now pending procedure as outlined above. Since her last visit, she denies chest pain, shortness of breath, lower extremity edema, fatigue, palpitations, melena, hematuria, hemoptysis, diaphoresis, weakness, presyncope, syncope, orthopnea, and PND.   Home Medications    Prior to Admission medications   Medication Sig Start Date End Date Taking? Authorizing Provider  acetaminophen (TYLENOL) 500 MG tablet Take 1,000 mg by mouth at bedtime.    [provider]  Ascorbic Acid (VITAMIN C PO) Take by mouth daily. And Zinc po    [provider]  Barberry-Oreg Grape-Goldenseal (BERBERINE COMPLEX PO) Take by mouth.  [provider]  ibuprofen (ADVIL) 800 MG tablet ibuprofen 800 mg tablet  Take 1 tablet every day by oral route at bedtime.    [provider]  metoprolol succinate (TOPROL-XL) 100 MG 24 hr tablet Take 1 tablet (100 mg total) by mouth daily. 08/01/21   Allred, Jeneen Rinks, MD  Multiple Vitamin (MULTIVITAMIN) capsule Take 1 capsule by mouth daily.    [provider]  TURMERIC PO Take by mouth.    [provider]  UNABLE TO FIND Med Name: Banaba po    [provider]  VITAMIN D PO Take 10,000 Units by mouth daily.    [provider]  flecainide (TAMBOCOR) 100 MG tablet Take 1 tablet (100 mg total) by mouth 2 (two) times daily. 09/17/11 11/09/11  Lelon Perla, MD    Physical Exam    Vital Signs:  Destiny Dennis does not have vital signs available for review today. Given telephonic nature of communication, physical exam is limited. AAOx3. NAD. Normal affect.  Speech and respirations are  unlabored.  Accessory Clinical Findings    None  Assessment & Plan    1.  Preoperative Cardiovascular Risk Assessment: She is doing well from a cardiac perspective and may proceed to surgery without further testing. According to the Revised Cardiac Risk Index (RCRI), her Perioperative Risk of Major Cardiac Event is (%): 0.9. Her Functional Capacity in METs is: 7.04 according to the [facility] Activity Status Index (DASI).   A copy of this note will be routed to requesting surgeon.  Time:   Today, I have spent 10 minutes with the patient with telehealth technology discussing medical history, symptoms, and management plan.     Emmaline Life, NP-C    01/26/2022, 12:02 PM [place] [address], [address] Office [phone] Fax [phone]

## 2022-01-28 ENCOUNTER — Other Ambulatory Visit (INDEPENDENT_AMBULATORY_CARE_PROVIDER_SITE_OTHER): Payer: 59

## 2022-01-28 DIAGNOSIS — E785 Hyperlipidemia, unspecified: Secondary | ICD-10-CM

## 2022-01-28 DIAGNOSIS — Z1159 Encounter for screening for other viral diseases: Secondary | ICD-10-CM

## 2022-01-28 DIAGNOSIS — E559 Vitamin D deficiency, unspecified: Secondary | ICD-10-CM | POA: Diagnosis not present

## 2022-01-28 DIAGNOSIS — Z114 Encounter for screening for human immunodeficiency virus [HIV]: Secondary | ICD-10-CM

## 2022-01-28 LAB — CBC WITH DIFFERENTIAL/PLATELET
Basophils Absolute: 0.1 10*3/uL (ref 0.0–0.1)
Basophils Relative: 0.8 % (ref 0.0–3.0)
Eosinophils Absolute: 0.3 10*3/uL (ref 0.0–0.7)
Eosinophils Relative: 4.9 % (ref 0.0–5.0)
HCT: 36.5 % (ref 36.0–46.0)
Hemoglobin: 12.3 g/dL (ref 12.0–15.0)
Lymphocytes Relative: 27.3 % (ref 12.0–46.0)
Lymphs Abs: 1.7 10*3/uL (ref 0.7–4.0)
MCHC: 33.8 g/dL (ref 30.0–36.0)
MCV: 86.8 fl (ref 78.0–100.0)
Monocytes Absolute: 0.5 10*3/uL (ref 0.1–1.0)
Monocytes Relative: 7.2 % (ref 3.0–12.0)
Neutro Abs: 3.8 10*3/uL (ref 1.4–7.7)
Neutrophils Relative %: 59.8 % (ref 43.0–77.0)
Platelets: 333 10*3/uL (ref 150.0–400.0)
RBC: 4.2 Mil/uL (ref 3.87–5.11)
RDW: 14.5 % (ref 11.5–15.5)
WBC: 6.3 10*3/uL (ref 4.0–10.5)

## 2022-01-28 LAB — LIPID PANEL
Cholesterol: 211 mg/dL — ABNORMAL HIGH (ref 0–200)
HDL: 56.3 mg/dL (ref >39.00)
LDL Cholesterol: 126 mg/dL — ABNORMAL HIGH (ref 0–99)
NonHDL: 154.74
Total CHOL/HDL Ratio: 4
Triglycerides: 143 mg/dL (ref 0.0–149.0)
VLDL: 28.6 mg/dL (ref 0.0–40.0)

## 2022-01-28 LAB — COMPREHENSIVE METABOLIC PANEL
ALT: 15 U/L (ref 0–35)
AST: 14 U/L (ref 0–37)
Albumin: 4.5 g/dL (ref 3.5–5.2)
Alkaline Phosphatase: 107 U/L (ref 39–117)
BUN: 18 mg/dL (ref 6–23)
CO2: 29 mEq/L (ref 19–32)
Calcium: 9.6 mg/dL (ref 8.4–10.5)
Chloride: 100 mEq/L (ref 96–112)
Creatinine, Ser: 0.7 mg/dL (ref 0.40–1.20)
GFR: 95.06 mL/min (ref >60.00)
Glucose, Bld: 100 mg/dL — ABNORMAL HIGH (ref 70–99)
Potassium: 4 mEq/L (ref 3.5–5.1)
Sodium: 137 mEq/L (ref 135–145)
Total Bilirubin: 0.6 mg/dL (ref 0.2–1.2)
Total Protein: 7.3 g/dL (ref 6.0–8.3)

## 2022-01-28 LAB — VITAMIN D 25 HYDROXY (VIT D DEFICIENCY, FRACTURES): VITD: 108.08 ng/mL (ref 30.00–100.00)

## 2022-01-29 LAB — HIV ANTIBODY (ROUTINE TESTING W REFLEX): HIV 1&2 Ab, 4th Generation: NONREACTIVE

## 2022-01-29 LAB — HEPATITIS C ANTIBODY
Hepatitis C Ab: NONREACTIVE
SIGNAL TO CUT-OFF: 0.09 (ref <1.00)

## 2022-02-03 ENCOUNTER — Other Ambulatory Visit: Payer: Self-pay | Admitting: General Surgery

## 2022-02-03 DIAGNOSIS — N6091 Unspecified benign mammary dysplasia of right breast: Secondary | ICD-10-CM

## 2022-03-19 NOTE — Progress Notes (Signed)
Reviewed pt chart and health hx with Dr. Valma Cava. He feels this pt would be more appropriate for the main OR. Called and spoke with triage RN at [facility] and she is aware and will have pt moved to the main.

## 2022-03-20 ENCOUNTER — Encounter (HOSPITAL_BASED_OUTPATIENT_CLINIC_OR_DEPARTMENT_OTHER): Payer: Self-pay | Admitting: General Surgery

## 2022-03-20 ENCOUNTER — Encounter: Payer: Self-pay | Admitting: Internal Medicine

## 2022-03-20 ENCOUNTER — Other Ambulatory Visit: Payer: Self-pay

## 2022-03-20 NOTE — Anesthesia Preprocedure Evaluation (Addendum)
Anesthesia Evaluation  Patient identified by MRN, date of birth, ID band Patient awake    Reviewed: Allergy & Precautions, NPO status , Patient's Chart, lab work & pertinent test results  History of Anesthesia Complications (+) PONV and history of anesthetic complications  Airway Mallampati: II  TM Distance: >3 FB Neck ROM: Full    Dental no notable dental hx. (+) Dental Advisory Given   Pulmonary neg pulmonary ROS,    Pulmonary exam normal        Cardiovascular + DVT  Normal cardiovascular exam+ dysrhythmias Atrial Fibrillation + pacemaker   S/P Maze repair of ASD   Neuro/Psych Anxiety negative neurological ROS     GI/Hepatic Neg liver ROS, hiatal hernia, GERD  ,  Endo/Other  negative endocrine ROS  Renal/GU negative Renal ROS     Musculoskeletal negative musculoskeletal ROS (+)   Abdominal   Peds  Hematology negative hematology ROS (+)   Anesthesia Other Findings RIGHT BREAST ATYPICAL HYPERPLASIA  Reproductive/Obstetrics                            Anesthesia Physical Anesthesia Plan  ASA: 3  Anesthesia Plan: General   Post-op Pain Management: Celebrex PO (pre-op)* and Tylenol PO (pre-op)*   Induction: Intravenous  PONV Risk Score and Plan: 4 or greater and Ondansetron, Dexamethasone, TIVA and Midazolam  Airway Management Planned: LMA  Additional Equipment:   Intra-op Plan:   Post-operative Plan: Extubation in OR  Informed Consent: I have reviewed the patients History and Physical, chart, labs and discussed the procedure including the risks, benefits and alternatives for the proposed anesthesia with the patient or authorized representative who has indicated his/her understanding and acceptance.     Dental advisory given  Plan Discussed with: CRNA, Anesthesiologist and Surgeon  Anesthesia Plan Comments: (Spoke to [facility] with [employer] regarding pacemaker. No  post-operative device check or reprogramming required.)       Anesthesia Quick Evaluation

## 2022-03-20 NOTE — Progress Notes (Signed)
Received a call from [place], the Director of Anesthesiology at [facility]. After he spoke with the surgeon Dr. Donne Hazel, he determined that this pt would be appropriate for surgery at [facility].

## 2022-03-20 NOTE — Progress Notes (Signed)
PERIOPERATIVE PRESCRIPTION FOR IMPLANTED CARDIAC DEVICE PROGRAMMING  Patient Information: Name:  Destiny Dennis  DOB:  05-26-1961  MRN:  [id number]    Planned Procedure:  breast biopsy, lumpectomy  Surgeon:  Dr Donne Hazel  Date of Procedure:  03/26/22  Cautery will be used.  Position during surgery:  supine   Please send documentation back to:  [place] (Fax # [phone]) Device Information:  Clinic EP Physician:  [name], MD   Device Type:  Pacemaker Manufacturer and Phone #:  [employer]: [id number] Pacemaker Dependent?:  No. Date of Last Device Check:  01/01/22 remote Normal Device Function?:  Yes.    Electrophysiologist's Recommendations:  Have magnet available. Provide continuous ECG monitoring when magnet is used or reprogramming is to be performed.  Procedure may interfere with device function.  Magnet should be placed over device during procedure.  Per Device Clinic Standing Orders, Vergie Living [place], [place]  12:16 PM 03/20/2022

## 2022-03-25 ENCOUNTER — Ambulatory Visit
Admission: RE | Admit: 2022-03-25 | Discharge: 2022-03-25 | Disposition: A | Discharge status: Home / Self Care | Payer: 59 | Source: Ambulatory Visit | Attending: General Surgery | Admitting: General Surgery

## 2022-03-25 DIAGNOSIS — N6091 Unspecified benign mammary dysplasia of right breast: Secondary | ICD-10-CM

## 2022-03-25 MED ORDER — ENSURE PRE-SURGERY PO LIQD: 296.0000 mL | Freq: Once | ORAL | Status: DC

## 2022-03-25 NOTE — Progress Notes (Signed)

## 2022-03-26 ENCOUNTER — Ambulatory Visit (HOSPITAL_BASED_OUTPATIENT_CLINIC_OR_DEPARTMENT_OTHER)
Admission: RE | Admit: 2022-03-26 | Discharge: 2022-03-26 | Disposition: A | Discharge status: Home / Self Care | Payer: 59 | Attending: General Surgery | Admitting: General Surgery

## 2022-03-26 ENCOUNTER — Ambulatory Visit (HOSPITAL_BASED_OUTPATIENT_CLINIC_OR_DEPARTMENT_OTHER): Payer: 59 | Admitting: Anesthesiology

## 2022-03-26 ENCOUNTER — Other Ambulatory Visit: Payer: Self-pay

## 2022-03-26 ENCOUNTER — Ambulatory Visit
Admission: RE | Admit: 2022-03-26 | Discharge: 2022-03-26 | Disposition: A | Discharge status: Home / Self Care | Payer: 59 | Source: Ambulatory Visit | Attending: General Surgery | Admitting: General Surgery

## 2022-03-26 ENCOUNTER — Encounter (HOSPITAL_BASED_OUTPATIENT_CLINIC_OR_DEPARTMENT_OTHER): Payer: Self-pay | Admitting: General Surgery

## 2022-03-26 ENCOUNTER — Encounter (HOSPITAL_BASED_OUTPATIENT_CLINIC_OR_DEPARTMENT_OTHER)
Admission: RE | Disposition: A | Discharge status: Home / Self Care | Payer: Self-pay | Source: Home / Self Care | Attending: General Surgery

## 2022-03-26 DIAGNOSIS — F419 Anxiety disorder, unspecified: Secondary | ICD-10-CM

## 2022-03-26 DIAGNOSIS — N6021 Fibroadenosis of right breast: Secondary | ICD-10-CM | POA: Insufficient documentation

## 2022-03-26 DIAGNOSIS — N6011 Diffuse cystic mastopathy of right breast: Secondary | ICD-10-CM | POA: Insufficient documentation

## 2022-03-26 DIAGNOSIS — I4891 Unspecified atrial fibrillation: Secondary | ICD-10-CM

## 2022-03-26 DIAGNOSIS — K449 Diaphragmatic hernia without obstruction or gangrene: Secondary | ICD-10-CM | POA: Diagnosis not present

## 2022-03-26 DIAGNOSIS — N6091 Unspecified benign mammary dysplasia of right breast: Secondary | ICD-10-CM

## 2022-03-26 DIAGNOSIS — Z950 Presence of cardiac pacemaker: Secondary | ICD-10-CM | POA: Diagnosis not present

## 2022-03-26 DIAGNOSIS — K219 Gastro-esophageal reflux disease without esophagitis: Secondary | ICD-10-CM | POA: Insufficient documentation

## 2022-03-26 DIAGNOSIS — R921 Mammographic calcification found on diagnostic imaging of breast: Secondary | ICD-10-CM | POA: Diagnosis not present

## 2022-03-26 DIAGNOSIS — I4819 Other persistent atrial fibrillation: Secondary | ICD-10-CM | POA: Insufficient documentation

## 2022-03-26 HISTORY — PX: RADIOACTIVE SEED GUIDED EXCISIONAL BREAST BIOPSY: SHX6490

## 2022-03-26 SURGERY — RADIOACTIVE SEED GUIDED BREAST BIOPSY
Anesthesia: General | Site: Breast | Laterality: Right

## 2022-03-26 MED ORDER — CEFAZOLIN SODIUM-DEXTROSE 2-4 GM/100ML-% IV SOLN
INTRAVENOUS | Status: AC
  Filled 2022-03-26: qty 100

## 2022-03-26 MED ORDER — FENTANYL CITRATE (PF) 100 MCG/2ML IJ SOLN
INTRAMUSCULAR | Status: DC | PRN
  Administered 2022-03-26: 100 ug via INTRAVENOUS
  Administered 2022-03-26: 25 ug via INTRAVENOUS

## 2022-03-26 MED ORDER — FENTANYL CITRATE (PF) 100 MCG/2ML IJ SOLN
INTRAMUSCULAR | Status: AC
  Filled 2022-03-26: qty 2

## 2022-03-26 MED ORDER — ONDANSETRON HCL 4 MG/2ML IJ SOLN
INTRAMUSCULAR | Status: AC
  Filled 2022-03-26: qty 24

## 2022-03-26 MED ORDER — AMISULPRIDE (ANTIEMETIC) 5 MG/2ML IV SOLN: 10.0000 mg | Freq: Once | INTRAVENOUS | Status: DC | PRN

## 2022-03-26 MED ORDER — LIDOCAINE 2% (20 MG/ML) 5 ML SYRINGE
INTRAMUSCULAR | Status: AC
  Filled 2022-03-26: qty 30

## 2022-03-26 MED ORDER — PROPOFOL 10 MG/ML IV BOLUS
INTRAVENOUS | Status: DC | PRN
  Administered 2022-03-26: 200 mg via INTRAVENOUS

## 2022-03-26 MED ORDER — CEFAZOLIN SODIUM-DEXTROSE 2-4 GM/100ML-% IV SOLN
2.0000 g | INTRAVENOUS | Status: AC
  Administered 2022-03-26: 2 g via INTRAVENOUS

## 2022-03-26 MED ORDER — BUPIVACAINE HCL (PF) 0.25 % IJ SOLN
INTRAMUSCULAR | Status: DC | PRN
  Administered 2022-03-26: 10 mL

## 2022-03-26 MED ORDER — MIDAZOLAM HCL 2 MG/2ML IJ SOLN
INTRAMUSCULAR | Status: AC
  Filled 2022-03-26: qty 2

## 2022-03-26 MED ORDER — OXYCODONE HCL 5 MG PO TABS
ORAL_TABLET | ORAL | Status: AC
  Filled 2022-03-26: qty 1

## 2022-03-26 MED ORDER — 0.9 % SODIUM CHLORIDE (POUR BTL) OPTIME
TOPICAL | Status: DC | PRN
  Administered 2022-03-26: 1000 mL

## 2022-03-26 MED ORDER — MIDAZOLAM HCL 5 MG/5ML IJ SOLN
INTRAMUSCULAR | Status: DC | PRN
  Administered 2022-03-26 (×2): 2 mg via INTRAVENOUS

## 2022-03-26 MED ORDER — PHENYLEPHRINE 80 MCG/ML (10ML) SYRINGE FOR IV PUSH (FOR BLOOD PRESSURE SUPPORT)
PREFILLED_SYRINGE | INTRAVENOUS | Status: AC
  Filled 2022-03-26: qty 30

## 2022-03-26 MED ORDER — PROPOFOL 500 MG/50ML IV EMUL
INTRAVENOUS | Status: DC | PRN
  Administered 2022-03-26: 150 ug/kg/min via INTRAVENOUS

## 2022-03-26 MED ORDER — ONDANSETRON HCL 4 MG/2ML IJ SOLN
INTRAMUSCULAR | Status: DC | PRN
  Administered 2022-03-26: 4 mg via INTRAVENOUS

## 2022-03-26 MED ORDER — DEXAMETHASONE SODIUM PHOSPHATE 10 MG/ML IJ SOLN
INTRAMUSCULAR | Status: AC
  Filled 2022-03-26: qty 3

## 2022-03-26 MED ORDER — FENTANYL CITRATE (PF) 100 MCG/2ML IJ SOLN
25.0000 ug | INTRAMUSCULAR | Status: DC | PRN
  Administered 2022-03-26: 25 ug via INTRAVENOUS

## 2022-03-26 MED ORDER — OXYCODONE HCL 5 MG PO TABS
5.0000 mg | ORAL_TABLET | Freq: Once | ORAL | Status: AC
  Administered 2022-03-26: 5 mg via ORAL

## 2022-03-26 MED ORDER — PROPOFOL 10 MG/ML IV BOLUS
INTRAVENOUS | Status: AC
  Filled 2022-03-26: qty 20

## 2022-03-26 MED ORDER — ACETAMINOPHEN 500 MG PO TABS
1000.0000 mg | ORAL_TABLET | ORAL | Status: AC
  Administered 2022-03-26: 1000 mg via ORAL

## 2022-03-26 MED ORDER — LIDOCAINE 2% (20 MG/ML) 5 ML SYRINGE
INTRAMUSCULAR | Status: DC | PRN
  Administered 2022-03-26: 60 mg via INTRAVENOUS

## 2022-03-26 MED ORDER — CHLORHEXIDINE GLUCONATE CLOTH 2 % EX PADS: 6.0000 | MEDICATED_PAD | Freq: Once | CUTANEOUS | Status: DC

## 2022-03-26 MED ORDER — LACTATED RINGERS IV SOLN: INTRAVENOUS | Status: DC

## 2022-03-26 MED ORDER — PROMETHAZINE HCL 25 MG/ML IJ SOLN: 6.2500 mg | INTRAMUSCULAR | Status: DC | PRN

## 2022-03-26 MED ORDER — EPHEDRINE 5 MG/ML INJ
INTRAVENOUS | Status: AC
  Filled 2022-03-26: qty 20

## 2022-03-26 MED ORDER — ACETAMINOPHEN 500 MG PO TABS
ORAL_TABLET | ORAL | Status: AC
  Filled 2022-03-26: qty 2

## 2022-03-26 SURGICAL SUPPLY — 56 items
ADH SKN CLS APL DERMABOND .7 (GAUZE/BANDAGES/DRESSINGS) ×1
APL PRP STRL LF DISP 70% ISPRP (MISCELLANEOUS) ×1
APPLIER CLIP 9.375 MED OPEN (MISCELLANEOUS)
APR CLP MED 9.3 20 MLT OPN (MISCELLANEOUS)
BINDER BREAST LRG (GAUZE/BANDAGES/DRESSINGS) IMPLANT
BINDER BREAST MEDIUM (GAUZE/BANDAGES/DRESSINGS) IMPLANT
BINDER BREAST XLRG (GAUZE/BANDAGES/DRESSINGS) IMPLANT
BINDER BREAST XXLRG (GAUZE/BANDAGES/DRESSINGS) ×1 IMPLANT
BLADE SURG 15 STRL LF DISP TIS (BLADE) ×1 IMPLANT
BLADE SURG 15 STRL SS (BLADE) ×2
CANISTER SUC SOCK COL 7IN (MISCELLANEOUS) IMPLANT
CANISTER SUCT 1200ML W/VALVE (MISCELLANEOUS) IMPLANT
CHLORAPREP W/TINT 26 (MISCELLANEOUS) ×2 IMPLANT
CLIP APPLIE 9.375 MED OPEN (MISCELLANEOUS) IMPLANT
CLIP TI WIDE RED SMALL 6 (CLIP) IMPLANT
COVER BACK TABLE 60X90IN (DRAPES) ×2 IMPLANT
COVER MAYO STAND STRL (DRAPES) ×2 IMPLANT
COVER PROBE W GEL 5X96 (DRAPES) ×2 IMPLANT
DERMABOND ADVANCED (GAUZE/BANDAGES/DRESSINGS) ×1
DERMABOND ADVANCED .7 DNX12 (GAUZE/BANDAGES/DRESSINGS) ×1 IMPLANT
DRAPE LAPAROSCOPIC ABDOMINAL (DRAPES) ×2 IMPLANT
DRAPE UTILITY XL STRL (DRAPES) ×2 IMPLANT
DRSG TEGADERM 4X4.75 (GAUZE/BANDAGES/DRESSINGS) IMPLANT
ELECT COATED BLADE 2.86 ST (ELECTRODE) ×2 IMPLANT
ELECT REM PT RETURN 9FT ADLT (ELECTROSURGICAL) ×2
ELECTRODE REM PT RTRN 9FT ADLT (ELECTROSURGICAL) ×1 IMPLANT
GAUZE SPONGE 4X4 12PLY STRL LF (GAUZE/BANDAGES/DRESSINGS) IMPLANT
GLOVE BIO SURGEON STRL SZ7 (GLOVE) ×4 IMPLANT
GLOVE BIOGEL PI IND STRL 7.5 (GLOVE) ×1 IMPLANT
GLOVE BIOGEL PI INDICATOR 7.5 (GLOVE) ×1
GOWN STRL REUS W/ TWL LRG LVL3 (GOWN DISPOSABLE) ×2 IMPLANT
GOWN STRL REUS W/TWL LRG LVL3 (GOWN DISPOSABLE) ×4
HEMOSTAT ARISTA ABSORB 3G PWDR (HEMOSTASIS) IMPLANT
KIT MARKER MARGIN INK (KITS) ×2 IMPLANT
NDL HYPO 25X1 1.5 SAFETY (NEEDLE) ×1 IMPLANT
NEEDLE HYPO 25X1 1.5 SAFETY (NEEDLE) ×2 IMPLANT
NS IRRIG 1000ML POUR BTL (IV SOLUTION) IMPLANT
PACK BASIN DAY SURGERY FS (CUSTOM PROCEDURE TRAY) ×2 IMPLANT
PENCIL SMOKE EVACUATOR (MISCELLANEOUS) ×2 IMPLANT
RETRACTOR ONETRAX LX 90X20 (MISCELLANEOUS) IMPLANT
SLEEVE SCD COMPRESS KNEE MED (STOCKING) ×2 IMPLANT
SPIKE FLUID TRANSFER (MISCELLANEOUS) IMPLANT
SPONGE T-LAP 4X18 ~~LOC~~+RFID (SPONGE) ×2 IMPLANT
STRIP CLOSURE SKIN 1/2X4 (GAUZE/BANDAGES/DRESSINGS) ×2 IMPLANT
SUT MNCRL AB 4-0 PS2 18 (SUTURE) IMPLANT
SUT MON AB 5-0 PS2 18 (SUTURE) ×1 IMPLANT
SUT SILK 2 0 SH (SUTURE) IMPLANT
SUT VIC AB 2-0 SH 27 (SUTURE) ×2
SUT VIC AB 2-0 SH 27XBRD (SUTURE) ×1 IMPLANT
SUT VIC AB 3-0 SH 27 (SUTURE) ×2
SUT VIC AB 3-0 SH 27X BRD (SUTURE) ×1 IMPLANT
SYR CONTROL 10ML LL (SYRINGE) ×2 IMPLANT
TOWEL GREEN STERILE FF (TOWEL DISPOSABLE) ×2 IMPLANT
TRAY FAXITRON CT DISP (TRAY / TRAY PROCEDURE) ×2 IMPLANT
TUBE CONNECTING 20X1/4 (TUBING) IMPLANT
YANKAUER SUCT BULB TIP NO VENT (SUCTIONS) IMPLANT

## 2022-03-26 NOTE — Transfer of Care (Signed)
Immediate Anesthesia Transfer of Care Note  Patient: Destiny Dennis  Procedure(s) Performed: RIGHT BREAST SEED GUIDED EXCISIONAL BIOPSY (Right: Breast)  Patient Location: PACU  Anesthesia Type:General  Level of Consciousness: awake, alert , oriented and patient cooperative  Airway & Oxygen Therapy: Patient Spontanous Breathing and Patient connected to face mask oxygen  Post-op Assessment: Report given to RN and Post -op Vital signs reviewed and stable  Post vital signs: Reviewed and stable  Last Vitals:  Vitals Value Taken Time  BP 117/67 03/26/22 1211  Temp    Pulse 64 03/26/22 1211  Resp 18 03/26/22 1211  SpO2 98 % 03/26/22 1211  Vitals shown include unvalidated device data.  Last Pain:  Vitals:   03/26/22 1005  TempSrc: Oral  PainSc: 0-No pain      Patients Stated Pain Goal: 4 (90/38/33 3832)  Complications: No notable events documented.

## 2022-03-26 NOTE — H&P (Signed)
  61 year old female who has no prior breast history and no family history who presents after undergoing a screening mammogram. She has B density breast. She was noted to have some calcifications in the right breast. On magnification views there is a 2.4 x 0.8 x 1.1 cm area of calcifications. These are new. These underwent a biopsy that shows atypical lobular hyperplasia. She has no mass or discharge that she notes. She does have a prior history of bilateral breast reduction with some resection of some scar tissue in the distant past. She also has a chest tube site in that breast from her closure of atrial septal defect and treatment of her recurrent persistent atrial fibrillation. She is here with her husband to discuss options.   Review of Systems: A complete review of systems was obtained from the patient. I have reviewed this information and discussed as appropriate with the patient. See HPI as well for other ROS.  Review of Systems  All other systems reviewed and are negative.   Medical History: Past Medical History:  Diagnosis Date  Arrhythmia  Arthritis   There is no problem list on file for this patient.  Past Surgical History:  Procedure Laterality Date  ANTERIOR FUSION CERVICAL SPINE  left hip replacement    Allergies  Allergen Reactions  Moxifloxacin Other (See Comments)  Numbness & tingling Peripheral neuropathy   Current Outpatient Medications on File Prior to Visit  Medication Sig Dispense Refill  metoprolol succinate (TOPROL-XL) 100 MG XL tablet metoprolol succinate ER 100 mg tablet,extended release 24 hr TAKE 1 TABLET BY MOUTH EVERY DAY  ascorbic acid, vitamin C, 550 mg/1.1 gram (scoop) Powd Take by mouth  cholecalciferol (VITAMIN D3) 1,250 mcg (50,000 unit) capsule cholecalciferol (vitamin D3) 1,250 mcg (50,000 unit) capsule TAKE ONE CAPSULE ONCE A WEEK FOR 8 WEEKS  turmeric, bulk, 95 % Powd Take by mouth    Family History  Problem Relation Age of Onset  Skin  cancer Mother  Diabetes Mother  Coronary Artery Disease (Blocked arteries around heart) Father    Social History   Tobacco Use  Smoking Status Never  Smokeless Tobacco Never  Marital status: Married  Tobacco Use  Smoking status: Never  Smokeless tobacco: Never  Vaping Use  Vaping Use: Never used  Substance and Sexual Activity  Alcohol use: Yes  Alcohol/week: 1.0 standard drink  Types: 1 Glasses of wine per week  Drug use: Never   Objective:   Vitals:   Physical Exam Constitutional:  Appearance: Normal appearance.  Chest:  Breasts: Right: No inverted nipple, mass or nipple discharge.  Left: No inverted nipple, mass or nipple discharge.  Lymphadenopathy:  Upper Body:  Right upper body: No supraclavicular or axillary adenopathy.  Left upper body: No supraclavicular or axillary adenopathy.  Neurological:  Mental Status: She is alert.    Assessment and Plan:   Atypical lobular hyperplasia of breast  We discussed the nature of this lesion. We do not think that this is necessarily an obligate precursor to cancer. It does however place her at high risk. We discussed the options including following her closely with every 57-monthmammograms for the next 2 years. Surgical excision is certainly not mandatory. We discussed the pros and cons of both approaches and she would like to proceed with a seed guided excisional biopsy of this area. We discussed surgery. We will obtain cardiac clearance prior to beginning.

## 2022-03-26 NOTE — Anesthesia Postprocedure Evaluation (Signed)
Anesthesia Post Note  Patient: Destiny Dennis  Procedure(s) Performed: RIGHT BREAST SEED GUIDED EXCISIONAL BIOPSY (Right: Breast)     Patient location during evaluation: PACU Anesthesia Type: General Level of consciousness: sedated Pain management: pain level controlled Vital Signs Assessment: post-procedure vital signs reviewed and stable Respiratory status: spontaneous breathing and respiratory function stable Cardiovascular status: stable Postop Assessment: no apparent nausea or vomiting Anesthetic complications: no   No notable events documented.  Last Vitals:  Vitals:   03/26/22 1300 03/26/22 1334  BP: 124/66 132/71  Pulse: 60 66  Resp: 16 16  Temp:  (!) 36.3 C  SpO2: 97% 98%    Last Pain:  Vitals:   03/26/22 1334  TempSrc: Oral  PainSc: 0-No pain                 Reed Dady DANIEL

## 2022-03-26 NOTE — Anesthesia Procedure Notes (Signed)
Procedure Name: LMA Insertion Date/Time: 03/26/2022 11:34 AM  Performed by: Maryella Shivers, CRNAPre-anesthesia Checklist: Patient identified, Emergency Drugs available, Suction available and Patient being monitored Patient Re-evaluated:Patient Re-evaluated prior to induction Oxygen Delivery Method: Circle system utilized Preoxygenation: Pre-oxygenation with 100% oxygen Induction Type: IV induction Ventilation: Mask ventilation without difficulty LMA: LMA inserted LMA Size: 4.0 Number of attempts: 1 Airway Equipment and Method: Bite block Placement Confirmation: positive ETCO2 Tube secured with: Tape Dental Injury: Teeth and Oropharynx as per pre-operative assessment

## 2022-03-26 NOTE — Op Note (Signed)
Preoperative diagnosis: right breast ALH Postoperative diagnosis: Same as above Procedure  Right breast seed guided excisional biopsy Surgeon: Dr. Serita Grammes Anesthesia: General  Estimated blood loss: minimal Specimens: Right breast tissue containing seed and clip marked with paint Complications: None Drains: None Special count was correct completion Disposition to recovery in stable condition  Indications: 61 year old female who has no prior breast history and no family history who presents after undergoing a screening mammogram. She has B density breast. She was noted to have some calcifications in the right breast. On magnification views there is a 2.4 x 0.8 x 1.1 cm area of calcifications. These underwent a biopsy that shows atypical lobular hyperplasia.  She does have a prior history of bilateral breast reduction with some resection of some scar tissue in the distant past. She also has a chest tube site in that breast from her closure of atrial septal defect and treatment of her recurrent persistent atrial fibrillation. We discussed options and elected to proceed with excisional biopsy  Procedure: After informed consent was obtained the patient was taken to the OR.  She was given antibiotics.  SCDs were in place.  She was placed under general anesthesia without complication.  She was prepped and draped in the standard sterile surgical fashion.  Surgical timeout was then performed.   I identified the seed in the central breast.  I reentered the old periareolar incision.  I then dissected to the seed. I removed the seed and some of surrounding tissue. Mammogram confirmed removal of the seed and the clip.   Hemostasis was observed. I closed the breast tissue with 2-0 vicryl. I did place a clip in the cavity.   I then closed skin with 3-0 vicryl and 5-0 monocryl. Glue was placed.   she tolerated well, was extubated and transferred to recovery stable

## 2022-03-26 NOTE — Interval H&P Note (Signed)
History and Physical Interval Note:  03/26/2022 10:52 AM  Destiny Dennis  has presented today for surgery, with the diagnosis of RIGHT BREAST ATYPICAL HYPERPLASIA.  The various methods of treatment have been discussed with the patient and family. After consideration of risks, benefits and other options for treatment, the patient has consented to  Procedure(s) with comments: RIGHT BREAST SEED GUIDED EXCISIONAL BIOPSY (Right) - LMA as a surgical intervention.  The patient's history has been reviewed, patient examined, no change in status, stable for surgery.  I have reviewed the patient's chart and labs.  Questions were answered to the patient's satisfaction.     Rolm Bookbinder

## 2022-03-26 NOTE — Discharge Instructions (Addendum)
[place] Office Phone Number [phone]  POST OP INSTRUCTIONS Take 400 mg of ibuprofen every 8 hours or 650 mg tylenol every 6 hours for next 72 hours then as needed. Use ice several times daily also.  A prescription for pain medication may be given to you upon discharge.  Take your pain medication as prescribed, if needed.  If narcotic pain medicine is not needed, then you may take acetaminophen (Tylenol), naprosyn (Alleve) or ibuprofen (Advil) as needed. Take your usually prescribed medications unless otherwise directed If you need a refill on your pain medication, please contact your pharmacy.  They will contact our office to request authorization.  Prescriptions will not be filled after 5pm or on week-ends. You should eat very light the first 24 hours after surgery, such as soup, crackers, pudding, etc.  Resume your normal diet the day after surgery. Most patients will experience some swelling and bruising in the breast.  Ice packs and a good support bra will help.  Wear the breast binder provided or a sports bra for 72 hours day and night.  After that wear a sports bra during the day until you return to the office. Swelling and bruising can take several days to resolve.  It is common to experience some constipation if taking pain medication after surgery.  Increasing fluid intake and taking a stool softener will usually help or prevent this problem from occurring.  A mild laxative (Milk of Magnesia or Miralax) should be taken according to package directions if there are no bowel movements after 48 hours. I used skin glue on the incision, you may shower in 24 hours.  The glue will flake off over the next 2-3 weeks.  Any sutures or staples will be removed at the office during your follow-up visit. ACTIVITIES:  You may resume regular daily activities (gradually increasing) beginning the next day.  Wearing a good support bra or sports bra minimizes pain and swelling.  You may have  sexual intercourse when it is comfortable. You may drive when you no longer are taking prescription pain medication, you can comfortably wear a seatbelt, and you can safely maneuver your car and apply brakes. RETURN TO WORK:  ______________________________________________________________________________________ Dennis Bast should see your doctor in the office for a follow-up appointment approximately two weeks after your surgery.  Your doctor's nurse will typically make your follow-up appointment when she calls you with your pathology report.  Expect your pathology report 3-4 business days after your surgery.  You may call to check if you do not hear from [place] after three days. OTHER INSTRUCTIONS: _______________________________________________________________________________________________ _____________________________________________________________________________________________________________________________________ _____________________________________________________________________________________________________________________________________ _____________________________________________________________________________________________________________________________________  WHEN TO CALL DR WAKEFIELD: Fever over 101.0 Nausea and/or vomiting. Extreme swelling or bruising. Continued bleeding from incision. Increased pain, redness, or drainage from the incision.  The clinic staff is available to answer your questions during regular business hours.  Please don't hesitate to call and ask to speak to one of the nurses for clinical concerns.  If you have a medical emergency, go to the nearest emergency room or call 911.  A surgeon from [name] is always on call at the hospital.  For further questions, please visit [email or web address] You may have Tylenol again after 4pm today, if needed.   Post Anesthesia Home Care Instructions  Activity: Get plenty of rest for the  remainder of the day. A responsible individual must stay with you for 24 hours following the procedure.  For the next 24 hours, DO NOT: -Drive a car -Operate  machinery -Drink alcoholic beverages -Take any medication unless instructed by your physician -Make any legal decisions or sign important papers.  Meals: Start with liquid foods such as gelatin or soup. Progress to regular foods as tolerated. Avoid greasy, spicy, heavy foods. If nausea and/or vomiting occur, drink only clear liquids until the nausea and/or vomiting subsides. Call your physician if vomiting continues.  Special Instructions/Symptoms: Your throat may feel dry or sore from the anesthesia or the breathing tube placed in your throat during surgery. If this causes discomfort, gargle with warm salt water. The discomfort should disappear within 24 hours.  If you had a scopolamine patch placed behind your ear for the management of post- operative nausea and/or vomiting:  1. The medication in the patch is effective for 72 hours, after which it should be removed.  Wrap patch in a tissue and discard in the trash. Wash hands thoroughly with soap and water. 2. You may remove the patch earlier than 72 hours if you experience unpleasant side effects which may include dry mouth, dizziness or visual disturbances. 3. Avoid touching the patch. Wash your hands with soap and water after contact with the patch.

## 2022-03-27 ENCOUNTER — Encounter (HOSPITAL_BASED_OUTPATIENT_CLINIC_OR_DEPARTMENT_OTHER): Payer: Self-pay | Admitting: General Surgery

## 2022-03-30 LAB — SURGICAL PATHOLOGY

## 2022-04-02 ENCOUNTER — Ambulatory Visit (INDEPENDENT_AMBULATORY_CARE_PROVIDER_SITE_OTHER): Payer: 59

## 2022-04-02 DIAGNOSIS — I495 Sick sinus syndrome: Secondary | ICD-10-CM

## 2022-04-02 LAB — CUP PACEART REMOTE DEVICE CHECK
Battery Remaining Longevity: 44 mo
Battery Voltage: 2.94 V
Brady Statistic AP VP Percent: 98.22 %
Brady Statistic AP VS Percent: 1.68 %
Brady Statistic AS VP Percent: 0.04 %
Brady Statistic AS VS Percent: 0.06 %
Brady Statistic RA Percent Paced: 99.93 %
Brady Statistic RV Percent Paced: 45.83 %
Date Time Interrogation Session: 20230816211200
Implantable Lead Implant Date: 20171023
Implantable Lead Implant Date: 20171023
Implantable Lead Implant Date: 20171023
Implantable Lead Location: 753858
Implantable Lead Location: 753859
Implantable Lead Location: 753860
Implantable Lead Model: 4298
Implantable Lead Model: 5076
Implantable Lead Model: 5076
Implantable Pulse Generator Implant Date: 20171023
Lead Channel Impedance Value: 304 Ohm
Lead Channel Impedance Value: 361 Ohm
Lead Channel Impedance Value: 380 Ohm
Lead Channel Impedance Value: 399 Ohm
Lead Channel Impedance Value: 399 Ohm
Lead Channel Impedance Value: 475 Ohm
Lead Channel Impedance Value: 570 Ohm
Lead Channel Impedance Value: 627 Ohm
Lead Channel Impedance Value: 627 Ohm
Lead Channel Impedance Value: 646 Ohm
Lead Channel Impedance Value: 703 Ohm
Lead Channel Impedance Value: 741 Ohm
Lead Channel Impedance Value: 836 Ohm
Lead Channel Impedance Value: 874 Ohm
Lead Channel Pacing Threshold Amplitude: 0.5 V
Lead Channel Pacing Threshold Amplitude: 0.5 V
Lead Channel Pacing Threshold Amplitude: 1.25 V
Lead Channel Pacing Threshold Pulse Width: 0.4 ms
Lead Channel Pacing Threshold Pulse Width: 0.4 ms
Lead Channel Pacing Threshold Pulse Width: 0.4 ms
Lead Channel Sensing Intrinsic Amplitude: 10.125 mV
Lead Channel Sensing Intrinsic Amplitude: 10.125 mV
Lead Channel Sensing Intrinsic Amplitude: 2 mV
Lead Channel Sensing Intrinsic Amplitude: 2 mV
Lead Channel Setting Pacing Amplitude: 1.75 V
Lead Channel Setting Pacing Amplitude: 2 V
Lead Channel Setting Pacing Amplitude: 2.5 V
Lead Channel Setting Pacing Pulse Width: 0.4 ms
Lead Channel Setting Pacing Pulse Width: 0.4 ms
Lead Channel Setting Sensing Sensitivity: 2 mV

## 2022-04-22 ENCOUNTER — Encounter (HOSPITAL_COMMUNITY): Payer: Self-pay

## 2022-04-23 ENCOUNTER — Other Ambulatory Visit (HOSPITAL_COMMUNITY): Payer: Self-pay | Admitting: Sports Medicine

## 2022-04-23 ENCOUNTER — Other Ambulatory Visit: Payer: Self-pay | Admitting: Sports Medicine

## 2022-04-23 DIAGNOSIS — M25551 Pain in right hip: Secondary | ICD-10-CM

## 2022-04-24 ENCOUNTER — Other Ambulatory Visit (HOSPITAL_COMMUNITY): Payer: Self-pay | Admitting: Sports Medicine

## 2022-04-24 DIAGNOSIS — M25551 Pain in right hip: Secondary | ICD-10-CM

## 2022-04-30 NOTE — Progress Notes (Signed)
Remote pacemaker transmission.   

## 2022-05-06 ENCOUNTER — Ambulatory Visit (INDEPENDENT_AMBULATORY_CARE_PROVIDER_SITE_OTHER): Payer: 59 | Admitting: Family Medicine

## 2022-05-06 ENCOUNTER — Encounter: Payer: Self-pay | Admitting: Family Medicine

## 2022-05-06 VITALS — BP 138/74 | HR 86 | Temp 97.6°F | Ht 71.0 in | Wt 252.0 lb

## 2022-05-06 DIAGNOSIS — R202 Paresthesia of skin: Secondary | ICD-10-CM | POA: Diagnosis not present

## 2022-05-06 DIAGNOSIS — E785 Hyperlipidemia, unspecified: Secondary | ICD-10-CM

## 2022-05-06 DIAGNOSIS — E559 Vitamin D deficiency, unspecified: Secondary | ICD-10-CM

## 2022-05-06 DIAGNOSIS — R739 Hyperglycemia, unspecified: Secondary | ICD-10-CM | POA: Diagnosis not present

## 2022-05-06 LAB — VITAMIN B12: Vitamin B-12: 313 pg/mL (ref 211–911)

## 2022-05-06 LAB — VITAMIN D 25 HYDROXY (VIT D DEFICIENCY, FRACTURES): VITD: 56.76 ng/mL (ref 30.00–100.00)

## 2022-05-06 LAB — TSH: TSH: 1.05 u[IU]/mL (ref 0.35–5.50)

## 2022-05-06 LAB — HEMOGLOBIN A1C: Hgb A1c MFr Bld: 5.5 % (ref 4.6–6.5)

## 2022-05-06 NOTE — Progress Notes (Signed)
Phone [phone] In person visit   Subjective:   Destiny Dennis is a 61 y.o. year old very pleasant female patient who presents for/with See problem oriented charting Chief Complaint  Patient presents with   tingling under skin    Pt c/o tingling under skin since Saturday after playing golf that comes up from literal feet to inner knee.   Past Medical History-  Patient Active Problem List   Diagnosis Date Noted   Pacemaker 12/05/2021    Priority: High   S/P [place] operation for atrial fibrillation + closure ASD 05/28/2016    Priority: High   Cardiomyopathy [facility])     Priority: High   SVT (supraventricular tachycardia) ([place]) 02/11/2011    Priority: High   Atrial fibrillation ([place]) 03/05/2007    Priority: High   Fatty liver 12/05/2021    Priority: Medium    Hyperlipidemia, unspecified 12/05/2021    Priority: Medium    Vitamin D deficiency 04/27/2017    Priority: Medium    Arthritis, 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     Ascorbic Acid (VITAMIN C PO) Take by mouth daily. And Zinc po     Barberry-Oreg Grape-Goldenseal (BERBERINE COMPLEX PO) Take by mouth.     ibuprofen (ADVIL) 800 MG tablet ibuprofen 800 mg tablet  Take 1 tablet every day by oral route at bedtime.     metoprolol succinate (TOPROL-XL) 100 MG 24 hr tablet Take 1 tablet (100 mg total) by mouth daily. 30 tablet 11   Multiple Vitamin (MULTIVITAMIN)  capsule Take 1 capsule by mouth daily.     TURMERIC PO Take by mouth.     UNABLE TO FIND Med Name: [place] po     No current facility-administered medications for this visit.     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 Was burning and tingling. Really that hit her out of the blue. Did some research on paresthesias and was concerning to her. Slightly better today. Played pickleball last night- no strength issues. Tingling comes and goes along with the pain - pain up to 5/10. Had been going to sports massage and was told tightness in this area even before this nad has massage  today. No back pain with this. Ibuprofen helps  -history of collapsed arches both feet. seeing podiatry soon . Ongoing foot pain. Sometimes gets numbness tingling in feet- can correspond with this pain -did see Dr. Doran Durand in past and was told complete reconstruction needed -also has MRI planned through Dr. Delilah Shan for the right hip- some ongoing issues there.  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 Fortunately improving. Not at level of wanting  medication for this  -Already had CBC and CMP recently-will not repeat at this time - For paresthesias we will work-up further with B12, TSH, P6P-PJ can certainly pursue neurology consult in the future if needed but suspect this is a mild peripheral neuropathy.  No back pain to suggest need for further lumbar work-up at this time (has seen Dr. Patrice Paradise in past for neck and back issues with history neck surgery)  Most recently checked:  Lab Results  Component Value Date   KDTOIZTI45 809 01/18/2017   Lab Results  Component Value Date   TSH 1.650 01/18/2017   Lab Results  Component Value Date   HGBA1C 5.3 05/18/2017   #Vitamin D deficiency S: Medication: none Last vitamin D Lab Results  Component Value Date   VD25OH 108.08 ([place]) 01/28/2022  A/P: Prior vitamin D deficiency but at last visit actually had hypervitaminosis D-she was taking higher dose after having COVID.  She remains off medication at this time-we will recheck to make sure this is not still elevated-I do not see any obvious links with paresthesias except for low vitamin D but just want to evaluate status as does have some in MV  Recommended follow up: Return for next already scheduled visit or sooner if needed. Future Appointments  Date Time Provider [place]  05/14/2022 10:00 AM Wallene Huh, [place] [facility]  06/12/2022 10:00 AM Marin Olp, [place] [facility]  06/22/2022  1:00 PM [facility] 1 [facility] [place]  07/02/2022  7:20 AM [facility] DEVICE REMOTES [facility]  10/01/2022  7:20 AM [facility] DEVICE REMOTES [facility]  12/31/2022  7:20 AM [facility] DEVICE REMOTES [facility]  04/01/2023  7:20 AM [facility] DEVICE REMOTES [facility]   Lab/Order associations:   ICD-10-CM   1. Paresthesia  R20.2 TSH    Hemoglobin A1c    Vitamin B12    2. Hyperlipidemia, unspecified hyperlipidemia type  E78.5 TSH    3. Hyperglycemia  R73.9 Hemoglobin A1c    4.  Vitamin D deficiency  E55.9 VITAMIN D 25 Hydroxy (Vit-D Deficiency, Fractures)     Time Spent: 21 minutes of total time (10:37 AM- 10:58 AM) was spent on the date of the encounter performing the following actions: chart review prior to seeing the patient, obtaining history, performing a medically necessary exam, counseling on the treatment plan as well as addressing patient fears (doubt als, ms for instance), placing orders, and documenting in our EHR.   Return precautions advised.  Garret Reddish, MD

## 2022-05-06 NOTE — Patient Instructions (Addendum)
Sign release of information at the check out desk for last pap smear with GYn  Please stop by lab before you go If you have mychart- we will send your results within 3 business days of [place] receiving them.  If you do not have mychart- we will call you about results within 5 business days of [place] receiving them.  *please also note that you will see labs on mychart as soon as they post. I will later go in and write notes on them- will say "notes from Dr. Yong Channel"   Suspect underlying neuropathy and check basic labs- likely flared this irritation with golf - I like your idea on podiatry visit to see if they have other thoughts for your feet  Recommended follow up: Return for next already scheduled visit or sooner if needed.

## 2022-05-14 ENCOUNTER — Ambulatory Visit (INDEPENDENT_AMBULATORY_CARE_PROVIDER_SITE_OTHER): Payer: 59 | Admitting: Podiatry

## 2022-05-14 ENCOUNTER — Ambulatory Visit (INDEPENDENT_AMBULATORY_CARE_PROVIDER_SITE_OTHER): Payer: 59

## 2022-05-14 DIAGNOSIS — M76821 Posterior tibial tendinitis, right leg: Secondary | ICD-10-CM | POA: Diagnosis not present

## 2022-05-14 DIAGNOSIS — M722 Plantar fascial fibromatosis: Secondary | ICD-10-CM

## 2022-05-14 DIAGNOSIS — M76822 Posterior tibial tendinitis, left leg: Secondary | ICD-10-CM | POA: Diagnosis not present

## 2022-05-14 MED ORDER — TRIAMCINOLONE ACETONIDE 10 MG/ML IJ SUSP
10.0000 mg | Freq: Once | INTRAMUSCULAR | Status: AC
  Administered 2022-05-14: 10 mg

## 2022-05-14 NOTE — Progress Notes (Signed)
Subjective:   Patient ID: Destiny Dennis, female   DOB: 61 y.o.   MRN: [id number]   HPI Patient presents stating she is getting pain in both feet but the right one is hurting more than the left 1 and it seems to be around the ankles and extends into the lower legs when she has been on her foot a long time.  Also gets occasional cramping when the feet turn inward.  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       Objective:  Physical Exam Vitals and nursing note reviewed.  Constitutional:      Appearance: She is well-developed.  Pulmonary:     Effort: Pulmonary effort is normal.  Musculoskeletal:        General: Normal range of motion.  Skin:    General: Skin is warm.  Neurological:     Mental Status: She is alert.     Neurovascular status intact muscle strength adequate range of motion within normal limits with flattening the arch noted bilateral significant in nature with inflammation pain around the posterior tibial tendon right over left as it comes underneath the medial malleolus.  Patient had previous surgery with fusion of the first metatarsal cuneiform joint which occurred and has good digital perfusion well oriented x3     Assessment:  Posterior tibial tendinitis bilateral with pain worse right over left with flatfoot deformity complicating factor     Plan:  H&P x-rays reviewed and today I went ahead and did a careful sterile prep and explained procedure and chances for rupture and went ahead today sheath injection posterior tib right 3 mg Kenalog 5 mg Xylocaine and applied fascial brace to lift up the arch and discussed about the probability of orthotic therapy long-term.  Reappoint to recheck 2 weeks to see results  X-rays indicate depression of the arch no indications of other pathology currently with history of fusion of the first  metatarsal cuneiform joint right

## 2022-05-19 ENCOUNTER — Other Ambulatory Visit: Payer: Self-pay | Admitting: Orthopaedic Surgery

## 2022-05-19 DIAGNOSIS — M5416 Radiculopathy, lumbar region: Secondary | ICD-10-CM

## 2022-05-27 ENCOUNTER — Other Ambulatory Visit (HOSPITAL_COMMUNITY): Payer: Self-pay | Admitting: Orthopedic Surgery

## 2022-05-27 DIAGNOSIS — M25551 Pain in right hip: Secondary | ICD-10-CM

## 2022-05-27 DIAGNOSIS — M5416 Radiculopathy, lumbar region: Secondary | ICD-10-CM

## 2022-05-28 ENCOUNTER — Ambulatory Visit: Payer: 59 | Admitting: Podiatry

## 2022-06-03 ENCOUNTER — Ambulatory Visit: Payer: 59 | Admitting: Podiatry

## 2022-06-10 ENCOUNTER — Telehealth: Payer: Self-pay | Admitting: Family Medicine

## 2022-06-10 NOTE — Telephone Encounter (Signed)
Please schedule pt for CPE appt, has not had one

## 2022-06-10 NOTE — Telephone Encounter (Signed)
Pt states: -recent 10/27 physical was cancelled because of travel. -she thinks she already had a physical this year with PCP team.   Pt asks: -Is physical needed? She will reschedule, if so.

## 2022-06-11 ENCOUNTER — Encounter: Payer: Self-pay | Admitting: Podiatry

## 2022-06-11 ENCOUNTER — Ambulatory Visit (INDEPENDENT_AMBULATORY_CARE_PROVIDER_SITE_OTHER): Payer: 59 | Admitting: Podiatry

## 2022-06-11 DIAGNOSIS — M76822 Posterior tibial tendinitis, left leg: Secondary | ICD-10-CM | POA: Diagnosis not present

## 2022-06-11 DIAGNOSIS — M7752 Other enthesopathy of left foot: Secondary | ICD-10-CM

## 2022-06-11 DIAGNOSIS — M76821 Posterior tibial tendinitis, right leg: Secondary | ICD-10-CM

## 2022-06-11 DIAGNOSIS — M7751 Other enthesopathy of right foot: Secondary | ICD-10-CM

## 2022-06-11 MED ORDER — TRIAMCINOLONE ACETONIDE 10 MG/ML IJ SUSP
20.0000 mg | Freq: Once | INTRAMUSCULAR | Status: AC
  Administered 2022-06-11: 20 mg

## 2022-06-12 ENCOUNTER — Encounter: Payer: 59 | Admitting: Family Medicine

## 2022-06-12 NOTE — Progress Notes (Signed)
Subjective:   Patient ID: Destiny Dennis, female   DOB: 61 y.o.   MRN: [id number]   HPI Patient states she is doing some better but she is developed pain in her left ankle and then also slight bit of pain in the outside of the right foot with fluid buildup around the fifth metatarsal head.  Patient does have significant flatfoot deformity still does get some discomfort medial ankle bilateral but improved from previous   ROS      Objective:  Physical Exam  Neurovascular status intact with inflammation pain of the sinus tarsi left and around the fifth MPJ right which may be compensatory with mild swelling still noted in the posterior tibial tendon bilateral improved but present with flatfoot deformity significant bilateral     Assessment:  Chronic posterior tibial tendinitis bilateral along with inflammatory capsulitis fifth MPJ right and sinus tarsitis left     Plan:  H&P reviewed all conditions went ahead today did sterile prep injected the sinus tarsi left 3 mg Kenalog 5 mg Xylocaine and around the fifth MPJ right 2 mg Dexasone Kenalog 5 mg Xylocaine discussed the flatfoot deformity chronic posterior tibial tendinitis and went ahead and casted for functional orthotics to elevate the arch and take pressure off the plantar arch posterior tibial tendon.  Patient will be seen back when ready and will wear supportive shoes until that time

## 2022-06-17 ENCOUNTER — Other Ambulatory Visit: Payer: 59

## 2022-06-22 ENCOUNTER — Ambulatory Visit (HOSPITAL_COMMUNITY)
Admission: RE | Admit: 2022-06-22 | Discharge: 2022-06-22 | Disposition: A | Discharge status: Home / Self Care | Payer: 59 | Source: Ambulatory Visit | Attending: Sports Medicine | Admitting: Sports Medicine

## 2022-06-22 ENCOUNTER — Ambulatory Visit (HOSPITAL_COMMUNITY)
Admission: RE | Admit: 2022-06-22 | Discharge: 2022-06-22 | Disposition: A | Discharge status: Home / Self Care | Payer: 59 | Source: Ambulatory Visit | Attending: Orthopedic Surgery | Admitting: Orthopedic Surgery

## 2022-06-22 DIAGNOSIS — M5416 Radiculopathy, lumbar region: Secondary | ICD-10-CM

## 2022-06-22 DIAGNOSIS — M25551 Pain in right hip: Secondary | ICD-10-CM | POA: Insufficient documentation

## 2022-06-22 NOTE — Progress Notes (Signed)
Changed device settings for MRI to  DOO at 85 bpm  MRI mode  Will Program device back to pre-MRI settings after completion of exam, and send transmission

## 2022-07-02 ENCOUNTER — Telehealth: Payer: Self-pay | Admitting: *Deleted

## 2022-07-02 ENCOUNTER — Ambulatory Visit (INDEPENDENT_AMBULATORY_CARE_PROVIDER_SITE_OTHER): Payer: 59

## 2022-07-02 DIAGNOSIS — I495 Sick sinus syndrome: Secondary | ICD-10-CM

## 2022-07-02 LAB — CUP PACEART REMOTE DEVICE CHECK
Battery Remaining Longevity: 38 mo
Battery Voltage: 2.93 V
Brady Statistic AP VP Percent: 98.25 %
Brady Statistic AP VS Percent: 1.68 %
Brady Statistic AS VP Percent: 0.03 %
Brady Statistic AS VS Percent: 0.04 %
Brady Statistic RA Percent Paced: 99.94 %
Brady Statistic RV Percent Paced: 51.73 %
Date Time Interrogation Session: 20231116093844
Implantable Lead Connection Status: 753985
Implantable Lead Connection Status: 753985
Implantable Lead Connection Status: 753985
Implantable Lead Implant Date: 20171023
Implantable Lead Implant Date: 20171023
Implantable Lead Implant Date: 20171023
Implantable Lead Location: 753858
Implantable Lead Location: 753859
Implantable Lead Location: 753860
Implantable Lead Model: 4298
Implantable Lead Model: 5076
Implantable Lead Model: 5076
Implantable Pulse Generator Implant Date: 20171023
Lead Channel Impedance Value: 304 Ohm
Lead Channel Impedance Value: 361 Ohm
Lead Channel Impedance Value: 361 Ohm
Lead Channel Impedance Value: 399 Ohm
Lead Channel Impedance Value: 437 Ohm
Lead Channel Impedance Value: 532 Ohm
Lead Channel Impedance Value: 627 Ohm
Lead Channel Impedance Value: 646 Ohm
Lead Channel Impedance Value: 665 Ohm
Lead Channel Impedance Value: 703 Ohm
Lead Channel Impedance Value: 760 Ohm
Lead Channel Impedance Value: 798 Ohm
Lead Channel Impedance Value: 931 Ohm
Lead Channel Impedance Value: 969 Ohm
Lead Channel Pacing Threshold Amplitude: 0.375 V
Lead Channel Pacing Threshold Amplitude: 0.5 V
Lead Channel Pacing Threshold Amplitude: 1.375 V
Lead Channel Pacing Threshold Pulse Width: 0.4 ms
Lead Channel Pacing Threshold Pulse Width: 0.4 ms
Lead Channel Pacing Threshold Pulse Width: 0.4 ms
Lead Channel Sensing Intrinsic Amplitude: 1.75 mV
Lead Channel Sensing Intrinsic Amplitude: 1.75 mV
Lead Channel Sensing Intrinsic Amplitude: 15.75 mV
Lead Channel Sensing Intrinsic Amplitude: 15.75 mV
Lead Channel Setting Pacing Amplitude: 2 V
Lead Channel Setting Pacing Amplitude: 2 V
Lead Channel Setting Pacing Amplitude: 2.5 V
Lead Channel Setting Pacing Pulse Width: 0.4 ms
Lead Channel Setting Pacing Pulse Width: 0.4 ms
Lead Channel Setting Sensing Sensitivity: 2 mV
Zone Setting Status: 755011
Zone Setting Status: 755011

## 2022-07-02 NOTE — Telephone Encounter (Signed)
   Pre-operative Risk Assessment    Patient Name: Destiny Dennis  DOB: 08-Mar-1961 MRN: [id number]      Request for Surgical Clearance    Procedure:   RIGHT TOTAL HIP ARTHROPLASTY  Date of Surgery:  Clearance 08/11/22                                 Surgeon:  DR. MATTHEW OLIN Surgeon's Group or Practice Name:  [facility] Phone number:  [phone] Fax number:  [phone]   Type of Clearance Requested:   - Medical    Type of Anesthesia:  Spinal   Additional requests/questions:    SignedJeanann Lewandowsky   07/02/2022, 9:40 AM

## 2022-07-02 NOTE — Telephone Encounter (Signed)
Pt has been scheduled for tele visit, 07/08/22, consent on file / medications reconciled.

## 2022-07-02 NOTE — Telephone Encounter (Signed)
   Name: Destiny Dennis  DOB: 1961-04-29  MRN: [id number]  Primary Cardiologist: None   Preoperative team, please contact this patient and set up a phone call appointment for further preoperative risk assessment. Please obtain consent and complete medication review. Thank you for your help.  I confirm that guidance regarding antiplatelet and oral anticoagulation therapy has been completed and, if necessary, noted below (none requested).   Lenna Sciara, NP 07/02/2022, 11:47 AM [place]

## 2022-07-02 NOTE — Telephone Encounter (Signed)
Pt has been scheduled for tele visit, 07/08/22, consent on file / medications reconciled.    Patient Consent for Virtual Visit        Destiny Dennis has provided verbal consent on 07/02/2022 for a virtual visit (video or telephone).   CONSENT FOR VIRTUAL VISIT FOR:  Destiny Dennis  By participating in this virtual visit I agree to the following:  I hereby voluntarily request, consent and authorize [facility] and its employed or contracted physicians, physician assistants, nurse practitioners or other licensed health care professionals (the Practitioner), to provide me with telemedicine health care services (the "Services") as deemed necessary by the treating Practitioner. I acknowledge and consent to receive the Services by the Practitioner via telemedicine. I understand that the telemedicine visit will involve communicating with the Practitioner through live audiovisual communication technology and the disclosure of certain medical information by electronic transmission. I acknowledge that I have been given the opportunity to request an in-person assessment or other available alternative prior to the telemedicine visit and am voluntarily participating in the telemedicine visit.  I understand that I have the right to withhold or withdraw my consent to the use of telemedicine in the course of my care at any time, without affecting my right to future care or treatment, and that the Practitioner or I may terminate the telemedicine visit at any time. I understand that I have the right to inspect all information obtained and/or recorded in the course of the telemedicine visit and may receive copies of available information for a reasonable fee.  I understand that some of the potential risks of receiving the Services via telemedicine include:  Delay or interruption in medical evaluation due to technological equipment failure or disruption; Information transmitted may not be sufficient  (e.g. poor resolution of images) to allow for appropriate medical decision making by the Practitioner; and/or  In rare instances, security protocols could fail, causing a breach of personal health information.  Furthermore, I acknowledge that it is my responsibility to provide information about my medical history, conditions and care that is complete and accurate to the best of my ability. I acknowledge that Practitioner's advice, recommendations, and/or decision may be based on factors not within their control, such as incomplete or inaccurate data provided by me or distortions of diagnostic images or specimens that may result from electronic transmissions. I understand that the practice of medicine is not an exact science and that Practitioner makes no warranties or guarantees regarding treatment outcomes. I acknowledge that a copy of this consent can be made available to me via my patient portal ([place]), or I can request a printed copy by calling the office of [facility].    I understand that my insurance will be billed for this visit.   I have read or had this consent read to me. I understand the contents of this consent, which adequately explains the benefits and risks of the Services being provided via telemedicine.  I have been provided ample opportunity to ask questions regarding this consent and the Services and have had my questions answered to my satisfaction. I give my informed consent for the services to be provided through the use of telemedicine in my medical care   3

## 2022-07-06 ENCOUNTER — Ambulatory Visit: Payer: 59 | Admitting: Internal Medicine

## 2022-07-07 ENCOUNTER — Ambulatory Visit: Payer: 59 | Admitting: Internal Medicine

## 2022-07-07 ENCOUNTER — Other Ambulatory Visit: Payer: 59

## 2022-07-08 ENCOUNTER — Ambulatory Visit (INDEPENDENT_AMBULATORY_CARE_PROVIDER_SITE_OTHER): Payer: 59 | Admitting: Family

## 2022-07-08 ENCOUNTER — Ambulatory Visit: Payer: 59 | Attending: Cardiology | Admitting: Student

## 2022-07-08 ENCOUNTER — Encounter: Payer: Self-pay | Admitting: Family

## 2022-07-08 VITALS — BP 139/86 | HR 66 | Temp 97.8°F | Ht 71.0 in | Wt 264.5 lb

## 2022-07-08 DIAGNOSIS — R053 Chronic cough: Secondary | ICD-10-CM | POA: Diagnosis not present

## 2022-07-08 DIAGNOSIS — Z01810 Encounter for preprocedural cardiovascular examination: Secondary | ICD-10-CM | POA: Diagnosis not present

## 2022-07-08 MED ORDER — METHYLPREDNISOLONE ACETATE 80 MG/ML IJ SUSP
80.0000 mg | Freq: Once | INTRAMUSCULAR | Status: AC
  Administered 2022-07-08: 80 mg via INTRAMUSCULAR

## 2022-07-08 MED ORDER — HYDROCOD POLI-CHLORPHE POLI ER 10-8 MG/5ML PO SUER: 5.0000 mL | Freq: Two times a day (BID) | ORAL | 0 refills | Status: DC | PRN

## 2022-07-08 NOTE — Progress Notes (Signed)
Virtual Visit via Telephone Note   Because of Destiny Dennis's co-morbid illnesses, she is at least at moderate risk for complications without adequate follow up.  This format is felt to be most appropriate for this patient at this time.  The patient did not have access to video technology/had technical difficulties with video requiring transitioning to audio format only (telephone).  All issues noted in this document were discussed and addressed.  No physical exam could be performed with this format.  Please refer to the patient's chart for her consent to telehealth for [name].  Evaluation Performed:  Preoperative Cardiovascular Risk Assessment _____________   Date:  07/08/2022   Patient ID:  Destiny Dennis, DOB 02-Jan-1961, MRN [id number] Patient Location:  Home Provider location:   Office  Primary Care Provider:  Marin Olp, MD Primary Cardiologist:  None  Chief Complaint / Patient Profile   Destiny Dennis is a 61 y.o. year old female with a history of normal coronaries on cardiac catheterization in 2017, non-ischemic cardiomyopathy with normalization of EF to 55-60% on last Echo in 2021, symptomatic sinus bradycardia s/p PPM in 2017, persistent atrial fibrillation s/p ablation in 2013 and MAZE procedure in 2017, ASD s/p repair in 2017, prior DVT, and obesity who is pending a right total hip arthroplasty on 08/11/2022 and presents today for telephonic preoperative cardiovascular risk assessment.  Past Medical History    Past Medical History:  Diagnosis Date   Allergic rhinitis    Anxiety    when tachycardia occurs   Arthritis    OA, s/p numerous surgeries -back, knees, hip   Benign fundic gland polyps of stomach    Cervical disc disease    Diverticulitis    CT Scan    DVT (deep venous thrombosis) ([place])    in pregnancy, s/p hip surgery   Fatty liver    GERD 12/23/2009   diet related-not a problem   Hemorrhoids    Hiatal hernia    small    History of MRSA infection 2008   superficial skin-cleared easily with doxycycline   Hx of adenomatous polyp of colon 12/21/2021   Obesity    Persistent atrial fibrillation ([place]) 03/05/2007   a. s/p RFCA 12/18/2011, 07/29/12   Post-operative nausea and vomiting    s/p atrial septal defect repair 05/28/2016   S/P Maze operation for atrial fibrillation 05/28/2016   Complete bilateral atrial lesion set using cryothermy and bipolar radiofrequency ablation with clipping of LA appendage via median sternotomy   Sialoadenitis    Tachy-brady syndrome ([place])    Past Surgical History:  Procedure Laterality Date   ASD REPAIR N/A 05/28/2016   Procedure: ATRIAL SEPTAL DEFECT (ASD) closure;  Surgeon: Rexene Alberts, MD;  Location: [place];  Service: Open Heart Surgery;  Laterality: N/A;   ATRIAL FIBRILLATION ABLATION N/A 12/18/2011   Procedure: ATRIAL FIBRILLATION ABLATION;  Surgeon: [name], MD;  Location: [facility] CATH LAB;  Service: Cardiovascular;  Laterality: N/A;   ATRIAL FIBRILLATION ABLATION N/A 07/29/2012   Procedure: ATRIAL FIBRILLATION ABLATION;  Surgeon: [name], MD;  Location: [facility] CATH LAB;  Service: Cardiovascular;  Laterality: N/A;   CARDIAC CATHETERIZATION N/A 04/10/2016   Procedure: Right/Left Heart Cath and Coronary Angiography;  Surgeon: Jettie Booze, MD;  Location: [place] CV LAB;  Service: Cardiovascular;  Laterality: N/A;   CERVICAL FUSION     CESAREAN SECTION     x 2   CHOLECYSTECTOMY     CLIPPING OF ATRIAL APPENDAGE  05/28/2016   Procedure: CLIPPING OF ATRIAL APPENDAGE;  Surgeon: Rexene Alberts, MD;  Location: [place];  Service: Open Heart Surgery;;   COLONOSCOPY  07/16/2011   diverticulosis   ELBOW SURGERY     Right   EP IMPLANTABLE DEVICE N/A 09/10/2015   Procedure: Loop Recorder Insertion;  Surgeon: [name], MD;  Location: [place] CV LAB;  Service: Cardiovascular;  Laterality: N/A;   EP IMPLANTABLE DEVICE N/A 06/08/2016   MDT Marcelino Scot CRT-P implanted by  Dr Curt Bears   FLEXIBLE SIGMOIDOSCOPY     [phone]   [place] Left 12/12/2012   Procedure: LEFT KNEE EXCISION SAPHENOUS NEUROMA/OPEN SCAR DEBRIDEMENT/POLY EXCHANGE/NERVE EXCISION;  Surgeon: Mauri Pole, MD;  [facility]: [facility];  Service: Orthopedics;  Laterality: Left;   JOINT REPLACEMENT Bilateral    4'14  knees   KNEE SURGERY     x 9 Left Knee   MAZE  05/28/2016   Procedure: MAZE;  Surgeon: Rexene Alberts, MD;  Location: [place];  Service: Open Heart Surgery;;   MEDIASTERNOTOMY N/A 05/28/2016   Procedure: MEDIAN STERNOTOMY;  Surgeon: Rexene Alberts, MD;  Location: [place];  Service: Open Heart Surgery;  Laterality: N/A;   RADIOACTIVE SEED GUIDED EXCISIONAL BREAST BIOPSY Right 03/26/2022   Procedure: RIGHT BREAST SEED GUIDED EXCISIONAL BIOPSY;  Surgeon: Rolm Bookbinder, MD;  Location: [place];  Service: General;  Laterality: Right;  LMA   REDUCTION MAMMAPLASTY Bilateral    SHOULDER SURGERY     Right    TEE WITHOUT CARDIOVERSION  12/17/2011   Procedure: TRANSESOPHAGEAL ECHOCARDIOGRAM (TEE);  Surgeon: Larey Dresser, MD;  Location: [facility] ENDOSCOPY;  Service: Cardiovascular;  Laterality: N/A;   TEE WITHOUT CARDIOVERSION  07/28/2012   Procedure: TRANSESOPHAGEAL ECHOCARDIOGRAM (TEE);  Surgeon: Thayer Headings, MD;  Location: [place];  Service: Cardiovascular;  Laterality: N/A;   TEE WITHOUT CARDIOVERSION N/A 01/01/2016   Procedure: TRANSESOPHAGEAL ECHOCARDIOGRAM (TEE);  Surgeon: Sanda Klein, MD;  Location: [facility] ENDOSCOPY;  Service: Cardiovascular;  Laterality: N/A;   TEE WITHOUT CARDIOVERSION N/A 05/28/2016   Procedure: TRANSESOPHAGEAL ECHOCARDIOGRAM (TEE);  Surgeon: Rexene Alberts, MD;  Location: [place];  Service: Open Heart Surgery;  Laterality: N/A;   TOTAL HIP ARTHROPLASTY Left 10/17/2013   Procedure: LEFT TOTAL HIP ARTHROPLASTY ANTERIOR APPROACH;  Surgeon: Mauri Pole, MD;  [facility];  Service: Orthopedics;   Laterality: Left;   TOTAL KNEE ARTHROPLASTY Right 12/12/2012   Procedure: RIGHT TOTAL KNEE ARTHROPLASTY;  Surgeon: Mauri Pole, MD;  [facility];  Service: Orthopedics;  Laterality: Right;   TUBAL LIGATION     UPPER GASTROINTESTINAL ENDOSCOPY  01/30/2010   hiatal hernia, fundic gland polyps   WRIST SURGERY     Left     Allergies  Allergies  Allergen Reactions   Avelox [Moxifloxacin Hcl In Nacl] Other (See Comments)    Numbness & tingling Peripheral neuropathy   Moxifloxacin Hcl Other (See Comments)    History of Present Illness    Destiny Dennis is a 61 y.o. female who presents via audio/video conferencing for a telehealth visit today.  Patient was last seen in our office on 08/01/2021 by Dr. Rayann Heman.  At that time, she was doing very well from a cardiac standpoint. She is now pending procedure as outlined above. Since her last visit, she has done well from a cardiac standpoint. She occasionally has some very brief palpitations  where she feels like her heart is racing but these are very short lived (only a couple of seconds) and is not new. No prolonged or sustained palpitations and no significant lightheadedness/dizziness. No syncope. She denies any chest pain, shortness of breath, orthopnea/PND, or edema. She currently has an upper respiratory infection and was seen by her PCP today and given a steroid injection but is otherwise doing well. Her activity is currently limited due to her significant hip pain but she states she was playing pickle ball 3 days a week this time last month without any issues before her hip started bothering her.   Home Medications    Prior to Admission medications   Medication Sig Start Date End Date Taking? Authorizing Provider  acetaminophen (TYLENOL) 500 MG tablet Take 1,000 mg by mouth at bedtime.    [provider]  Ascorbic Acid (VITAMIN C PO) Take by mouth daily. And Zinc po    [provider]  Barberry-Oreg Grape-Goldenseal  (BERBERINE COMPLEX PO) Take by mouth.    [provider]  chlorpheniramine-HYDROcodone (TUSSIONEX) 10-8 MG/5ML Take 5 mLs by mouth every 12 (twelve) hours as needed for cough. 07/08/22   Jeanie Sewer, NP  ibuprofen (ADVIL) 800 MG tablet ibuprofen 800 mg tablet  Take 1 tablet every day by oral route at bedtime.    [provider]  metoprolol succinate (TOPROL-XL) 100 MG 24 hr tablet Take 1 tablet (100 mg total) by mouth daily. 08/01/21   Allred, Jeneen Rinks, MD  Multiple Vitamin (MULTIVITAMIN) capsule Take 1 capsule by mouth daily.    [provider]  OVER THE COUNTER MEDICATION Take 29 g by mouth daily. [place]    [provider]  TURMERIC PO Take by mouth.    [provider]  UNABLE TO FIND Med Name: Banaba po    [provider]  flecainide (TAMBOCOR) 100 MG tablet Take 1 tablet (100 mg total) by mouth 2 (two) times daily. 09/17/11 11/09/11  Lelon Perla, MD    Physical Exam    Vital Signs:  Destiny Dennis does not have vital signs available for review today.  Given telephonic nature of communication, physical exam is limited. Alert and oriented x3. No acute distress. Normal affect. Speech and respirations are unlabored.  Accessory Clinical Findings    None  Assessment & Plan    Preoperative Cardiovascular Risk Assessment: Patient has an upcoming right total hip arthroplasty planned. She is stable from a cardiac standpoint with no angina, shortness of breath, acute CHF symptoms, significant palpitations, significant dizziness, syncope. Her activity is currently limited due to her hip pain but she was playing pickle ball 3 days a week a month ago. She was easily able to complete >4.0 METS without any anginal symptoms prior to her recent issues with her hip. Per Revised Cardiac Risk Index [place] Criteria), considered to have a 0.9% perioperative risk of a major cardiac event. Therefore, based on ACC/AHA guidelines, patient would be at  acceptable risk for the planned procedure without further cardiovascular testing. I will route this recommendation to the requesting party via Epic fax function.   A copy of this note will be routed to requesting surgeon.  Time:   Today, I have spent 7 minutes with the patient with telehealth technology discussing medical history, symptoms, and management plan.     Darreld Mclean, PA-C  07/08/2022, 3:43 PM

## 2022-07-08 NOTE — Progress Notes (Signed)
Patient ID: Destiny Dennis, female    DOB: 02-Mar-1961, 61 y.o.   MRN: [id number]  Chief Complaint  Patient presents with   Cough    Pt c/o Cough with slight mucus for about 2 weeks. Also some nasal drip and head pressure. Has tried ibuprofen and albuterol inhaler which does help.     HPI:     Persistent cough:  Pt c/o Cough with slight mucus for about 2 weeks. Also some nasal drip and head pressure. Has tried ibuprofen and albuterol inhaler which does help, but the inhaler makes her jittery. Also taking Benadryl qhs to sleep, starts coughing as soon as she lays down at night.  Denies sore throat, fever, or body aches, tested neg for Covid when sx started. Does not get the flu shot.      Assessment & Plan:  1. Persistent cough lungs are clear - pt given steroid injection in office and cough syrup sent to pharmacy, advised on use & SE of meds. Advised to increase Flonase to bid for the next week,  then reduce to qd, ok to add an OTC antihistamine, drink plenty of fluids. Call back if sx are not improving.  - methylPREDNISolone acetate (DEPO-MEDROL) injection 80 mg - chlorpheniramine-HYDROcodone (TUSSIONEX) 10-8 MG/5ML; Take 5 mLs by mouth every 12 (twelve) hours as needed for cough.  Dispense: 115 mL; Refill: 0    Subjective:    Outpatient Medications Prior to Visit  Medication Sig Dispense Refill   acetaminophen (TYLENOL) 500 MG tablet Take 1,000 mg by mouth at bedtime.     Ascorbic Acid (VITAMIN C PO) Take by mouth daily. And Zinc po     Barberry-Oreg Grape-Goldenseal (BERBERINE COMPLEX PO) Take by mouth.     ibuprofen (ADVIL) 800 MG tablet ibuprofen 800 mg tablet  Take 1 tablet every day by oral route at bedtime.     metoprolol succinate (TOPROL-XL) 100 MG 24 hr tablet Take 1 tablet (100 mg total) by mouth daily. 30 tablet 11   Multiple Vitamin (MULTIVITAMIN) capsule Take 1 capsule by mouth daily.     OVER THE COUNTER MEDICATION Take 29 g by mouth daily. SERVITAL     TURMERIC PO  Take by mouth.     UNABLE TO FIND Med Name: Banaba po     No facility-administered medications prior to visit.   Past Medical History:  Diagnosis Date   Allergic rhinitis    Anxiety    when tachycardia occurs   Arthritis    OA, s/p numerous surgeries -back, knees, hip   Benign fundic gland polyps of stomach    Cervical disc disease    Diverticulitis    CT Scan    DVT (deep venous thrombosis) ([facility])    in pregnancy, s/p hip surgery   Fatty liver    GERD 12/23/2009   diet related-not a problem   Hemorrhoids    Hiatal hernia    small   History of MRSA infection 2008   superficial skin-cleared easily with doxycycline   Hx of adenomatous polyp of colon 12/21/2021   Obesity    Persistent atrial fibrillation ([place]) 03/05/2007   a. s/p RFCA 12/18/2011, 07/29/12   Post-operative nausea and vomiting    s/p atrial septal defect repair 05/28/2016   S/P Maze operation for atrial fibrillation 05/28/2016   Complete bilateral atrial lesion set using cryothermy and bipolar radiofrequency ablation with clipping of LA appendage via median sternotomy   Sialoadenitis    Tachy-brady syndrome ([place])  Past Surgical History:  Procedure Laterality Date   ASD REPAIR N/A 05/28/2016   Procedure: ATRIAL SEPTAL DEFECT (ASD) closure;  Surgeon: Rexene Alberts, MD;  Location: [place];  Service: Open Heart Surgery;  Laterality: N/A;   ATRIAL FIBRILLATION ABLATION N/A 12/18/2011   Procedure: ATRIAL FIBRILLATION ABLATION;  Surgeon: [name], MD;  Location: [facility] CATH LAB;  Service: Cardiovascular;  Laterality: N/A;   ATRIAL FIBRILLATION ABLATION N/A 07/29/2012   Procedure: ATRIAL FIBRILLATION ABLATION;  Surgeon: [name], MD;  Location: [facility] CATH LAB;  Service: Cardiovascular;  Laterality: N/A;   CARDIAC CATHETERIZATION N/A 04/10/2016   Procedure: Right/Left Heart Cath and Coronary Angiography;  Surgeon: Jettie Booze, MD;  Location: [place] CV LAB;  Service: Cardiovascular;  Laterality: N/A;    CERVICAL FUSION     CESAREAN SECTION     x 2   CHOLECYSTECTOMY     CLIPPING OF ATRIAL APPENDAGE  05/28/2016   Procedure: CLIPPING OF ATRIAL APPENDAGE;  Surgeon: Rexene Alberts, MD;  Location: [place];  Service: Open Heart Surgery;;   COLONOSCOPY  07/16/2011   diverticulosis   ELBOW SURGERY     Right   EP IMPLANTABLE DEVICE N/A 09/10/2015   Procedure: Loop Recorder Insertion;  Surgeon: [name], MD;  Location: [place] CV LAB;  Service: Cardiovascular;  Laterality: N/A;   EP IMPLANTABLE DEVICE N/A 06/08/2016   MDT Marcelino Scot CRT-P implanted by Dr Curt Bears   FLEXIBLE SIGMOIDOSCOPY     [phone]   [place] Left 12/12/2012   Procedure: LEFT KNEE EXCISION SAPHENOUS NEUROMA/OPEN SCAR DEBRIDEMENT/POLY EXCHANGE/NERVE EXCISION;  Surgeon: Mauri Pole, MD;  [facility];  Service: Orthopedics;  Laterality: Left;   JOINT REPLACEMENT Bilateral    4'14  knees   KNEE SURGERY     x 9 Left Knee   MAZE  05/28/2016   Procedure: MAZE;  Surgeon: Rexene Alberts, MD;  Location: [place];  Service: Open Heart Surgery;;   MEDIASTERNOTOMY N/A 05/28/2016   Procedure: MEDIAN STERNOTOMY;  Surgeon: Rexene Alberts, MD;  Location: [place];  Service: Open Heart Surgery;  Laterality: N/A;   RADIOACTIVE SEED GUIDED EXCISIONAL BREAST BIOPSY Right 03/26/2022   Procedure: RIGHT BREAST SEED GUIDED EXCISIONAL BIOPSY;  Surgeon: Rolm Bookbinder, MD;  Location: [place];  Service: General;  Laterality: Right;  LMA   REDUCTION MAMMAPLASTY Bilateral    SHOULDER SURGERY     Right    TEE WITHOUT CARDIOVERSION  12/17/2011   Procedure: TRANSESOPHAGEAL ECHOCARDIOGRAM (TEE);  Surgeon: Larey Dresser, MD;  Location: [facility] ENDOSCOPY;  Service: Cardiovascular;  Laterality: N/A;   TEE WITHOUT CARDIOVERSION  07/28/2012   Procedure: TRANSESOPHAGEAL ECHOCARDIOGRAM (TEE);  Surgeon: Thayer Headings, MD;  Location: [place];  Service: Cardiovascular;  Laterality: N/A;    TEE WITHOUT CARDIOVERSION N/A 01/01/2016   Procedure: TRANSESOPHAGEAL ECHOCARDIOGRAM (TEE);  Surgeon: Sanda Klein, MD;  Location: [facility] ENDOSCOPY;  Service: Cardiovascular;  Laterality: N/A;   TEE WITHOUT CARDIOVERSION N/A 05/28/2016   Procedure: TRANSESOPHAGEAL ECHOCARDIOGRAM (TEE);  Surgeon: Rexene Alberts, MD;  Location: [place];  Service: Open Heart Surgery;  Laterality: N/A;   TOTAL HIP ARTHROPLASTY Left 10/17/2013   Procedure: LEFT TOTAL HIP ARTHROPLASTY ANTERIOR APPROACH;  Surgeon: Mauri Pole, MD;  [facility];  Service: Orthopedics;  Laterality: Left;   TOTAL KNEE ARTHROPLASTY Right 12/12/2012   Procedure: RIGHT TOTAL KNEE ARTHROPLASTY;  Surgeon: Rodman Key  Marian Sorrow, MD;  [facility];  Service: Orthopedics;  Laterality: Right;   TUBAL LIGATION     UPPER GASTROINTESTINAL ENDOSCOPY  01/30/2010   hiatal hernia, fundic gland polyps   WRIST SURGERY     Left    Allergies  Allergen Reactions   Avelox [Moxifloxacin Hcl In Nacl] Other (See Comments)    Numbness & tingling Peripheral neuropathy   Moxifloxacin Hcl Other (See Comments)      Objective:    Physical Exam Vitals and nursing note reviewed.  Constitutional:      Appearance: Normal appearance. She is not ill-appearing.     Interventions: Face mask in place.  HENT:     Right Ear: Tympanic membrane and ear canal normal.     Left Ear: Tympanic membrane and ear canal normal.     Nose:     Right Sinus: No frontal sinus tenderness.     Left Sinus: No frontal sinus tenderness.     Mouth/Throat:     Mouth: Mucous membranes are moist.     Pharynx: Posterior oropharyngeal erythema (mild) present. No pharyngeal swelling, oropharyngeal exudate or uvula swelling.     Tonsils: No tonsillar exudate or tonsillar abscesses.  Cardiovascular:     Rate and Rhythm: Normal rate and regular rhythm.  Pulmonary:     Effort: Pulmonary effort is normal.     Breath sounds: Normal breath sounds.  Musculoskeletal:        General: Normal  range of motion.  Lymphadenopathy:     Head:     Right side of head: No preauricular or posterior auricular adenopathy.     Left side of head: No preauricular or posterior auricular adenopathy.     Cervical: No cervical adenopathy.  Skin:    General: Skin is warm and dry.  Neurological:     Mental Status: She is alert.  Psychiatric:        Mood and Affect: Mood normal.        Behavior: Behavior normal.    BP 139/86 (BP Location: Left Arm, Patient Position: Sitting, Cuff Size: Large)   Pulse 66   Temp 97.8 F (36.6 C) (Temporal)   Ht '5\' 11"'$  (1.803 m)   Wt 264 lb 8 oz (120 kg)   LMP 04/17/2012   SpO2 98%   BMI 36.89 kg/m  Wt Readings from Last 3 Encounters:  07/08/22 264 lb 8 oz (120 kg)  05/06/22 252 lb (114.3 kg)  03/26/22 245 lb 2.4 oz (111.2 kg)       Jeanie Sewer, NP

## 2022-07-15 ENCOUNTER — Other Ambulatory Visit: Payer: 59

## 2022-07-20 ENCOUNTER — Telehealth: Payer: Self-pay | Admitting: Podiatry

## 2022-07-20 NOTE — Telephone Encounter (Signed)
Left message on machine to call back to schedule appt to pick up orthotics  - no balance

## 2022-07-22 NOTE — Progress Notes (Signed)
Remote pacemaker transmission.   

## 2022-07-29 NOTE — Patient Instructions (Signed)
DUE TO COVID-19 ONLY TWO VISITORS  (aged 59 and older)  ARE ALLOWED TO COME WITH YOU AND STAY IN THE WAITING ROOM ONLY DURING PRE OP AND PROCEDURE.   **NO VISITORS ARE ALLOWED IN THE SHORT STAY AREA OR RECOVERY ROOM!!**  IF YOU WILL BE ADMITTED INTO THE HOSPITAL YOU ARE ALLOWED ONLY FOUR SUPPORT PEOPLE DURING VISITATION HOURS ONLY (7 AM -8PM)   The support person(s) must pass our screening, gel in and out, and wear a mask at all times, including in the patient's room. Patients must also wear a mask when staff or their support person are in the room. Visitors GUEST BADGE MUST BE WORN VISIBLY  One adult visitor may remain with you overnight and MUST be in the room by 8 P.M.     Your procedure is scheduled on: 08/11/22   Report to [facility] Main Entrance    Report to admitting at 6:10 AM   Call this number if you have problems the morning of surgery [phone]   Do not eat food :After Midnight.   After Midnight you may have the following liquids until __5:30____ AM/  DAY OF SURGERY  Water Black Coffee (sugar ok, NO MILK/CREAM OR CREAMERS)  Tea (sugar ok, NO MILK/CREAM OR CREAMERS) regular and decaf                             Plain Jell-O (NO RED)                                           Fruit ices (not with fruit pulp, NO RED)                                     Popsicles (NO RED)                                                                  Juice: apple, WHITE grape, WHITE cranberry Sports drinks like Gatorade (NO RED)                  The day of surgery:  Drink ONE (1) Pre-Surgery Clear Ensure  at 5:15 AM the morning of surgery. Drink in one sitting. Do not sip.  This drink was given to you during your hospital  pre-op appointment visit. Nothing else to drink after completing the  Pre-Surgery Clear Ensure 5:30 AM          If you have questions, please contact your surgeon's office.       Oral Hygiene is also important to reduce your risk of infection.                                     Remember - BRUSH YOUR TEETH THE MORNING OF SURGERY WITH YOUR REGULAR TOOTHPASTE  DENTURES WILL BE REMOVED PRIOR TO SURGERY PLEASE DO NOT APPLY "Poly grip" OR ADHESIVES!!!   Do NOT smoke after Midnight   Take these  medicines the morning of surgery with A SIP OF WATER: Metoprolol   Bring CPAP mask and tubing day of surgery.                              You may not have any metal on your body including hair pins, jewelry, and body piercing             Do not wear make-up, lotions, powders, perfumes/cologne, or deodorant  Do not wear nail polish including gel and S&S, artificial/acrylic nails, or any other type of covering on natural nails including finger and toenails. If you have artificial nails, gel coating, etc. that needs to be removed by a nail salon please have this removed prior to surgery or surgery may need to be canceled/ delayed if the surgeon/ anesthesia feels like they are unable to be safely monitored.   Do not shave  48 hours prior to surgery.     Do not bring valuables to the hospital. [place].   Contacts, glasses, or bridgework may not be worn into surgery.      DO NOT [place]    Patients discharged on the day of surgery will not be allowed to drive home.  Someone NEEDS to stay with you for the first 24 hours after anesthesia.   Special Instructions: Bring a copy of your healthcare power of attorney and living will documents  the day of surgery if you haven't scanned them before.              Please read over the following fact sheets you were given: IF YOU HAVE QUESTIONS ABOUT YOUR PRE-OP INSTRUCTIONS PLEASE CALL [phone]    [facility] - Preparing for Surgery Before surgery, you can play an important role.  Because skin is not sterile, your skin needs to be as free of germs as possible.  You can reduce the number of germs on your skin by  washing with CHG (chlorahexidine gluconate) soap before surgery.  CHG is an antiseptic cleaner which kills germs and bonds with the skin to continue killing germs even after washing. Please DO NOT use if you have an allergy to CHG or antibacterial soaps.  If your skin becomes reddened/irritated stop using the CHG and inform your nurse when you arrive at Short Stay. Do not shave (including legs and underarms) for at least 48 hours prior to the first CHG shower.   Please follow these instructions carefully:  1.  Shower with CHG Soap the night before surgery and the  morning of Surgery.  2.  If you choose to wash your hair, wash your hair first as usual with your  normal  shampoo.  3.  After you shampoo, rinse your hair and body thoroughly to remove the  shampoo.                            4.  Use CHG as you would any other liquid soap.  You can apply chg directly  to the skin and wash                       Gently with a scrungie or clean washcloth.  5.  Apply the CHG Soap to your body ONLY  FROM THE NECK DOWN.   Do not use on face/ open                           Wound or open sores. Avoid contact with eyes, ears mouth and genitals (private parts).                       Wash face,  Genitals (private parts) with your normal soap.             6.  Wash thoroughly, paying special attention to the area where your surgery  will be performed.  7.  Thoroughly rinse your body with warm water from the neck down.  8.  DO NOT shower/wash with your normal soap after using and rinsing off  the CHG Soap.                9.  Pat yourself dry with a clean towel.            10.  Wear clean pajamas.            11.  Place clean sheets on your bed the night of your first shower and do not  sleep with pets. Day of Surgery : Do not apply any lotions/deodorants the morning of surgery.  Please wear clean clothes to the hospital/surgery center.  FAILURE TO FOLLOW THESE INSTRUCTIONS MAY RESULT IN THE CANCELLATION OF YOUR  SURGERY  ________________________________________________________________________  Incentive Spirometer  An incentive spirometer is a tool that can help keep your lungs clear and active. This tool measures how well you are filling your lungs with each breath. Taking long deep breaths may help reverse or decrease the chance of developing breathing (pulmonary) problems (especially infection) following: A long period of time when you are unable to move or be active. BEFORE THE PROCEDURE  If the spirometer includes an indicator to show your best effort, your nurse or respiratory therapist will set it to a desired goal. If possible, sit up straight or lean slightly forward. Try not to slouch. Hold the incentive spirometer in an upright position. INSTRUCTIONS FOR USE  Sit on the edge of your bed if possible, or sit up as far as you can in bed or on a chair. Hold the incentive spirometer in an upright position. Breathe out normally. Place the mouthpiece in your mouth and seal your lips tightly around it. Breathe in slowly and as deeply as possible, raising the piston or the ball toward the top of the column. Hold your breath for 3-5 seconds or for as long as possible. Allow the piston or ball to fall to the bottom of the column. Remove the mouthpiece from your mouth and breathe out normally. Rest for a few seconds and repeat Steps 1 through 7 at least 10 times every 1-2 hours when you are awake. Take your time and take a few normal breaths between deep breaths. The spirometer may include an indicator to show your best effort. Use the indicator as a goal to work toward during each repetition. After each set of 10 deep breaths, practice coughing to be sure your lungs are clear. If you have an incision (the cut made at the time of surgery), support your incision when coughing by placing a pillow or rolled up towels firmly against it. Once you are able to get out of bed, walk around indoors and cough  well. You may stop using the incentive  spirometer when instructed by your caregiver.  RISKS AND COMPLICATIONS Take your time so you do not get dizzy or light-headed. If you are in pain, you may need to take or ask for pain medication before doing incentive spirometry. It is harder to take a deep breath if you are having pain. AFTER USE Rest and breathe slowly and easily. It can be helpful to keep track of a log of your progress. Your caregiver can provide you with a simple table to help with this. If you are using the spirometer at home, follow these instructions: [place] IF:  You are having difficultly using the spirometer. You have trouble using the spirometer as often as instructed. Your pain medication is not giving enough relief while using the spirometer. You develop fever of 100.5 F (38.1 C) or higher. SEEK IMMEDIATE MEDICAL CARE IF:  You cough up bloody sputum that had not been present before. You develop fever of 102 F (38.9 C) or greater. You develop worsening pain at or near the incision site. MAKE SURE YOU:  Understand these instructions. Will watch your condition. Will get help right away if you are not doing well or get worse. Document Released: 12/14/2006 Document Revised: 10/26/2011 Document Reviewed: 02/14/2007 [facility] Patient Information 2014 [place], [place].   ________________________________________________________________________

## 2022-07-30 ENCOUNTER — Other Ambulatory Visit: Payer: Self-pay

## 2022-07-30 ENCOUNTER — Encounter: Payer: Self-pay | Admitting: Cardiovascular Disease

## 2022-07-30 ENCOUNTER — Encounter (HOSPITAL_COMMUNITY): Payer: Self-pay

## 2022-07-30 ENCOUNTER — Encounter (HOSPITAL_COMMUNITY)
Admission: RE | Admit: 2022-07-30 | Discharge: 2022-07-30 | Disposition: A | Discharge status: Home / Self Care | Payer: 59 | Source: Ambulatory Visit | Attending: Orthopedic Surgery | Admitting: Orthopedic Surgery

## 2022-07-30 DIAGNOSIS — R9431 Abnormal electrocardiogram [ECG] [EKG]: Secondary | ICD-10-CM | POA: Insufficient documentation

## 2022-07-30 DIAGNOSIS — I4891 Unspecified atrial fibrillation: Secondary | ICD-10-CM | POA: Insufficient documentation

## 2022-07-30 DIAGNOSIS — M1611 Unilateral primary osteoarthritis, right hip: Secondary | ICD-10-CM | POA: Diagnosis not present

## 2022-07-30 DIAGNOSIS — Z01818 Encounter for other preprocedural examination: Secondary | ICD-10-CM | POA: Diagnosis present

## 2022-07-30 HISTORY — DX: Other specified postprocedural states: Z98.890

## 2022-07-30 LAB — TYPE AND SCREEN
ABO/RH(D): O POS
Antibody Screen: NEGATIVE

## 2022-07-30 LAB — CBC
HCT: 38.5 % (ref 36.0–46.0)
Hemoglobin: 12.3 g/dL (ref 12.0–15.0)
MCH: 29.1 pg (ref 26.0–34.0)
MCHC: 31.9 g/dL (ref 30.0–36.0)
MCV: 91.2 fL (ref 80.0–100.0)
Platelets: 264 10*3/uL (ref 150–400)
RBC: 4.22 MIL/uL (ref 3.87–5.11)
RDW: 13.4 % (ref 11.5–15.5)
WBC: 7.4 10*3/uL (ref 4.0–10.5)
nRBC: 0 % (ref 0.0–0.2)

## 2022-07-30 LAB — BASIC METABOLIC PANEL
Anion gap: 6 (ref 5–15)
BUN: 8 mg/dL (ref 6–20)
CO2: 27 mmol/L (ref 22–32)
Calcium: 9 mg/dL (ref 8.9–10.3)
Chloride: 107 mmol/L (ref 98–111)
Creatinine, Ser: 0.55 mg/dL (ref 0.44–1.00)
GFR, Estimated: >60 mL/min (ref >60)
Glucose, Bld: 112 mg/dL — ABNORMAL HIGH (ref 70–99)
Potassium: 3.9 mmol/L (ref 3.5–5.1)
Sodium: 140 mmol/L (ref 135–145)

## 2022-07-30 LAB — SURGICAL PCR SCREEN
MRSA, PCR: NEGATIVE
Staphylococcus aureus: NEGATIVE

## 2022-07-30 NOTE — Progress Notes (Signed)
PERIOPERATIVE PRESCRIPTION FOR IMPLANTED CARDIAC DEVICE PROGRAMMING  Patient Information: Name:  YAIZA PALAZZOLA  DOB:  1960-10-07  MRN:  [id number]    Planned Procedure:  Right total hip arthroplasty, anterior approach  Surgeon:  Dr. Alvan Dame  Date of Procedure:  08/11/22  Cautery will be used.  Position during surgery:    Please send documentation back to:  [facility] (Fax # [phone])   Device Information:  Clinic EP Physician:  Dr. Doralee Albino   Device Type:  Pacemaker Manufacturer and Phone #:  Medtronic: [id number] Pacemaker Dependent?:  Unknown Date of Last Device Check:  07/02/2022 Normal Device Function?:  Yes.    Electrophysiologist's Recommendations:  Have magnet available. Provide continuous ECG monitoring when magnet is used or reprogramming is to be performed.  Procedure should not interfere with device function.  No device programming or magnet placement needed.  Per Device Clinic Standing Orders, Simone Curia, RN  2:15 PM 07/30/2022

## 2022-07-30 NOTE — Progress Notes (Signed)
Anesthesia note:  Bowel prep reminder:  no  PCP - Dr. Ansel Bong Cardiologist -Dr. Lenna Sciara. Allred Other-   Chest x-ray - 09/03/21 EKG - 07/30/22-chart Stress Test -  ECHO - 2021 Cardiac Cath - 2017, pacemaker 2017 CABG- Pacemaker/ICD device last checked:07/02/22  Sleep Study - no CPAP -   Pt is pre diabetic-no CBG at PAT visit- Fasting Blood Sugar at home- Checks Blood Sugar _____  Blood Thinner:no Blood Thinner Instructions: Aspirin Instructions: Last Dose:  Anesthesia review: Yes   reason:Cardiac  Patient denies shortness of breath, fever, cough and chest pain at PAT appointment. Pt developed a cough around 07/04/22. She was feeling better then the cough came back again. She now reports fluid in her Rt ear. She will call her PCP today. She was advised to call the PCP if not resolved. She had a non-productive cough at PAT visit O2 sat was 99%. VS WNL.   Patient verbalized understanding of instructions that were given to them at the PAT appointment. Patient was also instructed that they will need to review over the PAT instructions again at home before surgery.yes

## 2022-07-31 NOTE — Progress Notes (Signed)
Anesthesia Chart Review   Case: [id number] Date/Time: 08/11/22 0825   Procedure: TOTAL HIP ARTHROPLASTY ANTERIOR APPROACH (Right: Hip)   Anesthesia type: Spinal   Pre-op diagnosis: Right hip osteoarthritis   Location: [place] [facility] / [facility]   Surgeons: Paralee Cancel, MD       DISCUSSION:61 y.o. never smoker with h/o PONV, symptomatic sinus bradycardia s/p PPM in 2017, persistent atrial fibrillation s/p ablation in 2013 and MAZE procedure in 2017, ASD s/p repair in 2017, prior DVT non-ischemic cardiomyopathy with normalization of EF to 55-60% on last Echo in 2021, right hip OA scheduled for above procedure 08/04/2022 with Dr. Paralee Cancel.   Pt seen by cardiology 07/08/2022. Per OV note, "Preoperative Cardiovascular Risk Assessment: Patient has an upcoming right total hip arthroplasty planned. She is stable from a cardiac standpoint with no angina, shortness of breath, acute CHF symptoms, significant palpitations, significant dizziness, syncope. Her activity is currently limited due to her hip pain but she was playing pickle ball 3 days a week a month ago. She was easily able to complete >4.0 METS without any anginal symptoms prior to her recent issues with her hip. Per Revised Cardiac Risk Index [place] Criteria), considered to have a 0.9% perioperative risk of a major cardiac event. Therefore, based on ACC/AHA guidelines, patient would be at acceptable risk for the planned procedure without further cardiovascular testing. I will route this recommendation to the requesting party via Epic fax function."  Anticipate pt can proceed with planned procedure barring acute status change.   VS: BP (!) 164/87   Pulse 77   Temp 36.9 C (Oral)   Resp 18   Ht '5\' 11"'$  (1.803 m)   Wt 117.9 kg   LMP 04/17/2012   SpO2 99%   BMI 36.26 kg/m   PROVIDERS: Marin Olp, MD is PCP    LABS: Labs reviewed: Acceptable for surgery. (all labs ordered are listed, but only abnormal results are displayed)  Labs  Reviewed  BASIC METABOLIC PANEL - Abnormal; Notable for the following components:      Result Value   Glucose, Bld 112 (*)    All other components within normal limits  SURGICAL PCR SCREEN  CBC  TYPE AND SCREEN     IMAGES:   EKG:   CV: Echo 08/01/2020 1. Left ventricular ejection fraction, by estimation, is 55 to 60%. The  left ventricle has normal function. The left ventricle has no regional  wall motion abnormalities. The left ventricular internal cavity size was  moderately dilated. Left ventricular  diastolic parameters are indeterminate.   2. Right ventricular systolic function is mildly reduced. The right  ventricular size is normal. There is moderately elevated pulmonary artery  systolic pressure. The estimated right ventricular systolic pressure is  45.4 mmHg.   3. Left atrial size was severely dilated.   4. Right atrial size was severely dilated.   5. The mitral valve is grossly normal. Mild mitral valve regurgitation.   6. Tricuspid valve regurgitation is moderate.   7. The aortic valve is tricuspid. Aortic valve regurgitation is not  visualized. No aortic stenosis is present.   8. There is mild dilatation of the ascending aorta, measuring 41 mm.   9. The inferior vena cava is normal in size with <50% respiratory  variability, suggesting right atrial pressure of 8 mmHg.  Past Medical History:  Diagnosis Date   Allergic rhinitis    Anxiety    when tachycardia occurs   Arthritis    OA,  s/p numerous surgeries -back, knees, hip   Benign fundic gland polyps of stomach    Cervical disc disease    Diverticulitis    CT Scan    DVT (deep venous thrombosis) (HCC)    in pregnancy, s/p hip surgery   Fatty liver    GERD 12/23/2009   diet related-not a problem   Hemorrhoids    Hiatal hernia    small   History of loop recorder    placed then removed   History of MRSA infection 2008   superficial skin-cleared easily with doxycycline   Hx of adenomatous polyp of  colon 12/21/2021   Obesity    Persistent atrial fibrillation ([place]) 03/05/2007   a. s/p RFCA 12/18/2011, 07/29/12   Post-operative nausea and vomiting    Presence of permanent cardiac pacemaker 06/08/2016   s/p atrial septal defect repair 05/28/2016   S/P Maze operation for atrial fibrillation 05/28/2016   Complete bilateral atrial lesion set using cryothermy and bipolar radiofrequency ablation with clipping of LA appendage via median sternotomy   Sialoadenitis    Tachy-brady syndrome ([place])     Past Surgical History:  Procedure Laterality Date   ASD REPAIR N/A 05/28/2016   Procedure: ATRIAL SEPTAL DEFECT (ASD) closure;  Surgeon: Rexene Alberts, MD;  Location: [place];  Service: Open Heart Surgery;  Laterality: N/A;   ATRIAL FIBRILLATION ABLATION N/A 12/18/2011   Procedure: ATRIAL FIBRILLATION ABLATION;  Surgeon: [name], MD;  [facility] C[facility] LAB;  Service: Cardiovascular;  Laterality: N/A;   ATRIAL FIBRILLATION ABLATION N/A 07/29/2012   Procedure: ATRIAL FIBRILLATION ABLATION;  Surgeon: [name], MD;  Location: [facility] CATH LAB;  Service: Cardiovascular;  Laterality: N/A;   CARDIAC CATHETERIZATION N/A 04/10/2016   Procedure: Right/Left Heart Cath and Coronary Angiography;  Surgeon: Jettie Booze, MD;  Location: [place] CV LAB;  Service: Cardiovascular;  Laterality: N/A;   CERVICAL FUSION  2010   CESAREAN SECTION     x 2   CHOLECYSTECTOMY  2006   CLIPPING OF ATRIAL APPENDAGE  05/28/2016   Procedure: CLIPPING OF ATRIAL APPENDAGE;  Surgeon: Rexene Alberts, MD;  Location: [place];  Service: Open Heart Surgery;;   COLONOSCOPY  07/16/2011   diverticulosis   ELBOW SURGERY     Right   EP IMPLANTABLE DEVICE N/A 09/10/2015   Procedure: Loop Recorder Insertion;  Surgeon: [name], MD;  Location: [place] CV LAB;  Service: Cardiovascular;  Laterality: N/A;   EP IMPLANTABLE DEVICE N/A 06/08/2016   MDT Marcelino Scot CRT-P implanted by Dr Curt Bears   FLEXIBLE SIGMOIDOSCOPY      [phone]   [place] Left 12/12/2012   Procedure: LEFT KNEE EXCISION SAPHENOUS NEUROMA/OPEN SCAR DEBRIDEMENT/POLY EXCHANGE/NERVE EXCISION;  Surgeon: Mauri Pole, MD;  Location: [facility];  Service: Orthopedics;  Laterality: Left;   JOINT REPLACEMENT Bilateral    4'14  knees   KNEE SURGERY     x 9 Left Knee   MAZE  05/28/2016   Procedure: MAZE;  Surgeon: Rexene Alberts, MD;  Location: [place];  Service: Open Heart Surgery;;   MEDIASTERNOTOMY N/A 05/28/2016   Procedure: MEDIAN STERNOTOMY;  Surgeon: Rexene Alberts, MD;  Location: [place];  Service: Open Heart Surgery;  Laterality: N/A;   RADIOACTIVE SEED GUIDED EXCISIONAL BREAST BIOPSY Right 03/26/2022   Procedure: RIGHT BREAST SEED GUIDED EXCISIONAL BIOPSY;  Surgeon: Rolm Bookbinder, MD;  Location: [facility];  Service: General;  Laterality: Right;  LMA   REDUCTION MAMMAPLASTY Bilateral    SHOULDER SURGERY     Right    TEE WITHOUT CARDIOVERSION  12/17/2011   Procedure: TRANSESOPHAGEAL ECHOCARDIOGRAM (TEE);  Surgeon: Larey Dresser, MD;  Location: [facility] ENDOSCOPY;  Service: Cardiovascular;  Laterality: N/A;   TEE WITHOUT CARDIOVERSION  07/28/2012   Procedure: TRANSESOPHAGEAL ECHOCARDIOGRAM (TEE);  Surgeon: Thayer Headings, MD;  Location: [place];  Service: Cardiovascular;  Laterality: N/A;   TEE WITHOUT CARDIOVERSION N/A 01/01/2016   Procedure: TRANSESOPHAGEAL ECHOCARDIOGRAM (TEE);  Surgeon: Sanda Klein, MD;  Location: [facility] ENDOSCOPY;  Service: Cardiovascular;  Laterality: N/A;   TEE WITHOUT CARDIOVERSION N/A 05/28/2016   Procedure: TRANSESOPHAGEAL ECHOCARDIOGRAM (TEE);  Surgeon: Rexene Alberts, MD;  Location: [place];  Service: Open Heart Surgery;  Laterality: N/A;   TOTAL HIP ARTHROPLASTY Left 10/17/2013   Procedure: LEFT TOTAL HIP ARTHROPLASTY ANTERIOR APPROACH;  Surgeon: Mauri Pole, MD;  [facility];  Service: Orthopedics;  Laterality: Left;   TOTAL KNEE ARTHROPLASTY  Right 12/12/2012   Procedure: RIGHT TOTAL KNEE ARTHROPLASTY;  Surgeon: Mauri Pole, MD;  [facility];  Service: Orthopedics;  Laterality: Right;   TUBAL LIGATION  2005   UPPER GASTROINTESTINAL ENDOSCOPY  01/30/2010   hiatal hernia, fundic gland polyps   WRIST SURGERY     Left     MEDICATIONS:  acetaminophen (TYLENOL) 500 MG tablet   Barberry-Oreg Grape-Goldenseal (BERBERINE COMPLEX PO)   chlorpheniramine-HYDROcodone (TUSSIONEX) 10-8 MG/5ML   fluticasone (FLONASE) 50 MCG/ACT nasal spray   ibuprofen (ADVIL) 200 MG tablet   MAGNESIUM PO   metoprolol succinate (TOPROL-XL) 100 MG 24 hr tablet   Multiple Vitamin (MULTIVITAMIN) capsule   Omega-3 Fatty Acids (OMEGA 3 PO)   OVER THE COUNTER MEDICATION   OVER THE COUNTER MEDICATION   progesterone (PROMETRIUM) 100 MG capsule   sodium chloride (OCEAN) 0.65 % SOLN nasal spray   TURMERIC PO   No current facility-administered medications for this encounter.    Konrad Felix Ward, PA-C [facility] Pre-Surgical Testing [phone]

## 2022-08-04 ENCOUNTER — Other Ambulatory Visit (HOSPITAL_BASED_OUTPATIENT_CLINIC_OR_DEPARTMENT_OTHER): Payer: Self-pay | Admitting: Internal Medicine

## 2022-08-07 ENCOUNTER — Ambulatory Visit (INDEPENDENT_AMBULATORY_CARE_PROVIDER_SITE_OTHER): Payer: 59 | Admitting: Family Medicine

## 2022-08-07 ENCOUNTER — Encounter: Payer: Self-pay | Admitting: Family Medicine

## 2022-08-07 VITALS — BP 140/82 | HR 78 | Temp 99.9°F | Ht 71.0 in | Wt 263.5 lb

## 2022-08-07 DIAGNOSIS — R059 Cough, unspecified: Secondary | ICD-10-CM | POA: Diagnosis not present

## 2022-08-07 DIAGNOSIS — E669 Obesity, unspecified: Secondary | ICD-10-CM

## 2022-08-07 DIAGNOSIS — J329 Chronic sinusitis, unspecified: Secondary | ICD-10-CM

## 2022-08-07 DIAGNOSIS — B9689 Other specified bacterial agents as the cause of diseases classified elsewhere: Secondary | ICD-10-CM

## 2022-08-07 DIAGNOSIS — I471 Supraventricular tachycardia, unspecified: Secondary | ICD-10-CM

## 2022-08-07 LAB — POCT INFLUENZA A/B
Influenza A, POC: NEGATIVE
Influenza B, POC: NEGATIVE

## 2022-08-07 LAB — POC COVID19 BINAXNOW: SARS Coronavirus 2 Ag: NEGATIVE

## 2022-08-07 MED ORDER — AMOXICILLIN-POT CLAVULANATE 875-125 MG PO TABS: 1.0000 | ORAL_TABLET | Freq: Two times a day (BID) | ORAL | 0 refills | Status: AC

## 2022-08-07 MED ORDER — GUAIFENESIN-CODEINE 100-10 MG/5ML PO SOLN: 5.0000 mL | Freq: Four times a day (QID) | ORAL | 0 refills | Status: DC | PRN

## 2022-08-07 NOTE — Progress Notes (Signed)
Phone [phone] In person visit   Subjective:   Destiny Dennis is a 61 y.o. year old very pleasant female patient who presents for/with See problem oriented charting Chief Complaint  Patient presents with   Cough    Pt c/o Cough, Left ear pain and drainage for about a week and a half, Has tried Amoxicillin, Benadryl, Mucinex which did not help. Covid negative when symptoms started.    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     amoxicillin-clavulanate (AUGMENTIN) 875-125 MG tablet Take 1 tablet by mouth 2 (two) times daily for 7 days. 14 tablet 0   Barberry-Oreg Grape-Goldenseal (BERBERINE COMPLEX PO) Take 1 tablet by mouth 2 (two) times daily.     fluticasone (FLONASE) 50 MCG/ACT nasal spray Place 1 spray into both nostrils daily as needed for allergies or rhinitis.     guaiFENesin-codeine 100-10 MG/5ML syrup Take 5 mLs by mouth every 6 (six) hours as needed  for cough (do not drive for 8 hours after taking). 120 mL 0   ibuprofen (ADVIL) 200 MG tablet Take 800 mg by mouth every evening.     MAGNESIUM PO Take 2 tablets by mouth at bedtime.     metoprolol succinate (TOPROL-XL) 100 MG 24 hr tablet TAKE 1 TABLET BY MOUTH EVERY DAY 30 tablet 11   Multiple Vitamin (MULTIVITAMIN) capsule Take 1 capsule by mouth daily.     Omega-3 Fatty Acids (OMEGA 3 PO) Take 2 capsules by mouth daily.     OVER THE COUNTER MEDICATION Take 4 capsules by mouth at bedtime. SERVITAL supplement     OVER THE COUNTER MEDICATION Take 2 capsules by mouth daily in the afternoon. ADIPROMIN weight loss supplement     progesterone (PROMETRIUM) 100 MG capsule Take 100 mg by mouth at bedtime.     sodium chloride (OCEAN) 0.65 % SOLN nasal spray Place 1 spray into both nostrils 2 (two) times daily.     TURMERIC PO Take 3 tablets by mouth daily.     No current facility-administered medications for this visit.     Objective:  BP (!) 140/82 (BP Location: Left Arm, Patient Position: Sitting, Cuff Size: Large)   Pulse 78   Temp 99.9 F (37.7 C) (Temporal)   Ht '5\' 11"'$  (1.803 m)   Wt 263 lb 8 oz (119.5 kg)  LMP 04/17/2012   SpO2 97%   BMI 36.75 kg/m  Gen: NAD, resting comfortably Right frontal and maxillary sinus tenderness rather significant, none on the left.  Nasal turbinates erythematous and erythematous with some dried blood and yellow discharge.  Pharynx with mild drainage-rest of oral cavity normal.  No cervical lymphadenopathy noted CV: RRR no murmurs rubs or gallops Lungs: CTAB no crackles, wheeze, rhonchi Ext: no edema Skin: warm, dry  Results for orders placed or performed in visit on 08/07/22 (from the past 24 hour(s))  POCT Influenza A/B     Status: Normal   Collection Time: 08/07/22 10:31 AM  Result Value Ref Range   Influenza A, POC Negative Negative   Influenza B, POC Negative Negative  POC COVID-19     Status: Normal   Collection Time: 08/07/22 10:32 AM   Result Value Ref Range   SARS Coronavirus 2 Ag Negative Negative       Assessment and Plan   # cough/ear pain S:Looking back patient was seen on 07/08/2022 and had over 2 weeks of cough at that point with clear lungs-was treated with a Depo-Medrol injection as well as Tussionex- had significant improvement but mild lingering cough that never went away.   A week and a half ago flared back up (after getting a facial from someone who said their husband was very sick). Constant drainage/tickle In the throat. Left ear pain with drainage as well- lays on said and hears squeaky noise so sleeping in recliner instead.  - goes to integrative doctor for hormonal therapy and while there treated with plain amoxicillin 875 as well as steroid decadron- just finished yesterday on amoxillin- no improvement. Also took benadryl and mucinex.  -covid test negative at home (had in August) -tessalon hasn't helped, delysm hasn't helped,   Upcoming hip replacement on 08/11/22  A/P: 10 days of sinus pressure/congestion with severely tender right maxillary and frontal sinus tenderness along with nasal turbinate edema/erythema/yellow discharge- high probability of bacterial sinusitis. Failing amoxillin but not first line agent for this- we will advance to augmentin. We also considered doxycycline strongly with recent amoxicillin use -she is having a hard time sleeping- will give codeine cough syrup but we reviewed risks particularly addictive potential   - stop afrin with a fib hitsory and with BP high today -If this doesn't clear in 2 weeks lets get a chest x-ray (plus may get before surgery). Lungs clear on exam   #Atrial fibrillation with history of MAZE operation with LAA clip by Dr. Roxy Manns #SVT S: Rate controlled with metoprolol 100 mg extended release- this also addresses SVT- per patient still gets brief periods of this Chadsvasc score of 1- per cardiology no NOAC at this time Patient is  followed by  cardiology: Dr. Rayann Heman  in past- now no longer with office A/P: a fib without recurrence. Svt has don well on metoprolol but has run out- encouraged her to pick this up- may also help with mildly elevated BP- also stop afroin.    # Obesity- peak weight 285 S:-has had success with optiva- down 60 lbs on home scales in 2023 max -stopped optivia and gained some weight back she reports but still below max weight.  Wt Readings from Last 3 Encounters:  08/07/22 263 lb 8 oz (119.5 kg)  07/30/22 260 lb (117.9 kg)  07/08/22 264 lb 8 oz (120 kg)  -working with integrative- they suggested keto- she trialed for 3 weeks and didn't help -fat burner and another supplement but  stopping before surgery A/P: extensive discussion today- she plans to join [employer] and meet with nutritionist as next step and consider  weight watchers if not effective -we discussed weight loss drugs like ozempic/wegovy- but concern is for rebound weight gain and shed like to stay off fo rnow - has plan to restart pickleball once cleared from hip but we also discussed adding some strength training element  Recommended follow up: Return for next already scheduled visit or sooner if needed. Future Appointments  Date Time Provider [place]  08/21/2022  9:15 AM Baldwin [place], PA-C [facility]  09/03/2022 11:15 AM [facility]  10/01/2022  7:20 AM [facility] DEVICE REMOTES [facility]  10/30/2022  2:20 PM Marin Olp, MD [facility]  12/31/2022  7:20 AM [facility] DEVICE REMOTES [facility]  04/01/2023  7:00 AM [facility] DEVICE REMOTES [facility]    Lab/Order associations:   ICD-10-CM   1. Cough, unspecified type  R05.9 POCT Influenza A/B    POC COVID-19    2. Bacterial sinusitis  J32.9    B96.89     3. SVT (supraventricular tachycardia)  I47.10     4. Obesity (BMI 30-39.9)  E66.9       Meds ordered this encounter  Medications    amoxicillin-clavulanate (AUGMENTIN) 875-125 MG tablet    Sig: Take 1 tablet by mouth 2 (two) times daily for 7 days.    Dispense:  14 tablet    Refill:  0   guaiFENesin-codeine 100-10 MG/5ML syrup    Sig: Take 5 mLs by mouth every 6 (six) hours as needed for cough (do not drive for 8 hours after taking).    Dispense:  120 mL    Refill:  0    Return precautions advised.  Garret Reddish, MD

## 2022-08-07 NOTE — Patient Instructions (Addendum)
Team please log pap from 2020 listed in last gyn note under media-   Also for Destiny Dennis- Sign release of information at the check out desk for last pap smear  10 days of sinus pressure/congestion with severely tender right maxillary and frontal sinus tenderness along with nasal turbinate edema/erythema/yellow discharge- high probability of bacterial sinusitis. Failing amoxillin but not first line agent for this- we will advance to augmentin. We also considered doxycycline strongly with recent amoxicillin use -she is having a hard time sleeping- will give codeine cough syrup but we reviewed risks particularly addictive potential    STOP afrin- not good for BP  If this doesn't clear in 2 weeks lets get a chest x-ray  Recommended follow up: Return for next already scheduled visit or sooner if needed.

## 2022-08-11 ENCOUNTER — Encounter (HOSPITAL_COMMUNITY)
Admission: RE | Disposition: A | Discharge status: Home / Self Care | Payer: Self-pay | Source: Ambulatory Visit | Attending: Orthopedic Surgery

## 2022-08-11 ENCOUNTER — Encounter (HOSPITAL_COMMUNITY): Payer: Self-pay | Admitting: Orthopedic Surgery

## 2022-08-11 ENCOUNTER — Ambulatory Visit (HOSPITAL_COMMUNITY): Payer: 59 | Admitting: Physician Assistant

## 2022-08-11 ENCOUNTER — Ambulatory Visit (HOSPITAL_COMMUNITY)
Admission: RE | Admit: 2022-08-11 | Discharge: 2022-08-11 | Disposition: A | Discharge status: Home / Self Care | Payer: 59 | Source: Ambulatory Visit | Attending: Orthopedic Surgery | Admitting: Orthopedic Surgery

## 2022-08-11 ENCOUNTER — Ambulatory Visit (HOSPITAL_BASED_OUTPATIENT_CLINIC_OR_DEPARTMENT_OTHER): Payer: 59 | Admitting: Certified Registered Nurse Anesthetist

## 2022-08-11 ENCOUNTER — Other Ambulatory Visit: Payer: Self-pay

## 2022-08-11 ENCOUNTER — Ambulatory Visit (HOSPITAL_COMMUNITY): Payer: 59

## 2022-08-11 DIAGNOSIS — M1611 Unilateral primary osteoarthritis, right hip: Secondary | ICD-10-CM | POA: Diagnosis not present

## 2022-08-11 DIAGNOSIS — Z96641 Presence of right artificial hip joint: Secondary | ICD-10-CM

## 2022-08-11 DIAGNOSIS — I82409 Acute embolism and thrombosis of unspecified deep veins of unspecified lower extremity: Secondary | ICD-10-CM | POA: Diagnosis not present

## 2022-08-11 DIAGNOSIS — Z950 Presence of cardiac pacemaker: Secondary | ICD-10-CM | POA: Insufficient documentation

## 2022-08-11 DIAGNOSIS — E669 Obesity, unspecified: Secondary | ICD-10-CM | POA: Diagnosis not present

## 2022-08-11 DIAGNOSIS — Z86718 Personal history of other venous thrombosis and embolism: Secondary | ICD-10-CM | POA: Diagnosis not present

## 2022-08-11 DIAGNOSIS — I495 Sick sinus syndrome: Secondary | ICD-10-CM | POA: Diagnosis not present

## 2022-08-11 DIAGNOSIS — Z01818 Encounter for other preprocedural examination: Secondary | ICD-10-CM

## 2022-08-11 DIAGNOSIS — F419 Anxiety disorder, unspecified: Secondary | ICD-10-CM | POA: Diagnosis not present

## 2022-08-11 DIAGNOSIS — I4891 Unspecified atrial fibrillation: Secondary | ICD-10-CM

## 2022-08-11 DIAGNOSIS — Z6837 Body mass index (BMI) 37.0-37.9, adult: Secondary | ICD-10-CM

## 2022-08-11 HISTORY — PX: TOTAL HIP ARTHROPLASTY: SHX124

## 2022-08-11 SURGERY — ARTHROPLASTY, HIP, TOTAL, ANTERIOR APPROACH
Anesthesia: Spinal | Site: Hip | Laterality: Right

## 2022-08-11 MED ORDER — PROPOFOL 1000 MG/100ML IV EMUL
INTRAVENOUS | Status: AC
  Filled 2022-08-11: qty 100

## 2022-08-11 MED ORDER — TRANEXAMIC ACID-NACL 1000-0.7 MG/100ML-% IV SOLN
1000.0000 mg | INTRAVENOUS | Status: AC
  Administered 2022-08-11: 1000 mg via INTRAVENOUS
  Filled 2022-08-11: qty 100

## 2022-08-11 MED ORDER — HYDROCODONE-ACETAMINOPHEN 5-325 MG PO TABS: 1.0000 | ORAL_TABLET | ORAL | 0 refills | Status: DC | PRN

## 2022-08-11 MED ORDER — MIDAZOLAM HCL 2 MG/2ML IJ SOLN
INTRAMUSCULAR | Status: AC
  Filled 2022-08-11: qty 2

## 2022-08-11 MED ORDER — ONDANSETRON HCL 4 MG/2ML IJ SOLN
INTRAMUSCULAR | Status: DC | PRN
  Administered 2022-08-11: 4 mg via INTRAVENOUS

## 2022-08-11 MED ORDER — LACTATED RINGERS IV BOLUS
500.0000 mL | Freq: Once | INTRAVENOUS | Status: AC
  Administered 2022-08-11: 500 mL via INTRAVENOUS

## 2022-08-11 MED ORDER — BUPIVACAINE-EPINEPHRINE 0.5% -1:200000 IJ SOLN
INTRAMUSCULAR | Status: DC | PRN
  Administered 2022-08-11: 30 mL

## 2022-08-11 MED ORDER — DEXAMETHASONE SODIUM PHOSPHATE 10 MG/ML IJ SOLN
8.0000 mg | Freq: Once | INTRAMUSCULAR | Status: AC
  Administered 2022-08-11: 8 mg via INTRAVENOUS

## 2022-08-11 MED ORDER — BUPIVACAINE-EPINEPHRINE (PF) 0.5% -1:200000 IJ SOLN
INTRAMUSCULAR | Status: AC
  Filled 2022-08-11: qty 30

## 2022-08-11 MED ORDER — FENTANYL CITRATE (PF) 100 MCG/2ML IJ SOLN
INTRAMUSCULAR | Status: DC | PRN
  Administered 2022-08-11 (×2): 50 ug via INTRAVENOUS

## 2022-08-11 MED ORDER — OXYCODONE HCL 5 MG PO TABS: 5.0000 mg | ORAL_TABLET | Freq: Once | ORAL | Status: AC | PRN

## 2022-08-11 MED ORDER — EPHEDRINE 5 MG/ML INJ
INTRAVENOUS | Status: AC
  Filled 2022-08-11: qty 5

## 2022-08-11 MED ORDER — FENTANYL CITRATE PF 50 MCG/ML IJ SOSY
25.0000 ug | PREFILLED_SYRINGE | INTRAMUSCULAR | Status: DC | PRN
  Administered 2022-08-11: 50 ug via INTRAVENOUS

## 2022-08-11 MED ORDER — POLYETHYLENE GLYCOL 3350 17 G PO PACK: 17.0000 g | PACK | Freq: Two times a day (BID) | ORAL | 0 refills | Status: DC

## 2022-08-11 MED ORDER — ONDANSETRON HCL 4 MG/2ML IJ SOLN: 4.0000 mg | Freq: Once | INTRAMUSCULAR | Status: DC | PRN

## 2022-08-11 MED ORDER — PROPOFOL 500 MG/50ML IV EMUL
INTRAVENOUS | Status: DC | PRN
  Administered 2022-08-11: 75 ug/kg/min via INTRAVENOUS

## 2022-08-11 MED ORDER — POVIDONE-IODINE 10 % EX SWAB
2.0000 | Freq: Once | CUTANEOUS | Status: AC
  Administered 2022-08-11: 2 via TOPICAL

## 2022-08-11 MED ORDER — ACETAMINOPHEN 325 MG PO TABS: 325.0000 mg | ORAL_TABLET | ORAL | Status: DC | PRN

## 2022-08-11 MED ORDER — TIZANIDINE HCL 4 MG PO TABS: 4.0000 mg | ORAL_TABLET | Freq: Three times a day (TID) | ORAL | 1 refills | Status: DC | PRN

## 2022-08-11 MED ORDER — ORAL CARE MOUTH RINSE: 15.0000 mL | Freq: Once | OROMUCOSAL | Status: AC

## 2022-08-11 MED ORDER — KETOROLAC TROMETHAMINE 30 MG/ML IJ SOLN: 30.0000 mg | Freq: Once | INTRAMUSCULAR | Status: DC | PRN

## 2022-08-11 MED ORDER — ACETAMINOPHEN 160 MG/5ML PO SOLN: 325.0000 mg | ORAL | Status: DC | PRN

## 2022-08-11 MED ORDER — LACTATED RINGERS IV BOLUS: 250.0000 mL | Freq: Once | INTRAVENOUS | Status: DC

## 2022-08-11 MED ORDER — 0.9 % SODIUM CHLORIDE (POUR BTL) OPTIME
TOPICAL | Status: DC | PRN
  Administered 2022-08-11: 1000 mL

## 2022-08-11 MED ORDER — FENTANYL CITRATE PF 50 MCG/ML IJ SOSY
PREFILLED_SYRINGE | INTRAMUSCULAR | Status: AC
  Administered 2022-08-11: 50 ug via INTRAVENOUS
  Filled 2022-08-11: qty 2

## 2022-08-11 MED ORDER — LACTATED RINGERS IV SOLN: INTRAVENOUS | Status: DC

## 2022-08-11 MED ORDER — CHLORHEXIDINE GLUCONATE 0.12 % MT SOLN
15.0000 mL | Freq: Once | OROMUCOSAL | Status: AC
  Administered 2022-08-11: 15 mL via OROMUCOSAL

## 2022-08-11 MED ORDER — METHOCARBAMOL 500 MG IVPB - SIMPLE MED: 500.0000 mg | Freq: Four times a day (QID) | INTRAVENOUS | Status: DC | PRN

## 2022-08-11 MED ORDER — PHENYLEPHRINE HCL-NACL 20-0.9 MG/250ML-% IV SOLN
INTRAVENOUS | Status: DC | PRN
  Administered 2022-08-11: 30 ug/min via INTRAVENOUS

## 2022-08-11 MED ORDER — CEFAZOLIN IN SODIUM CHLORIDE 3-0.9 GM/100ML-% IV SOLN
3.0000 g | INTRAVENOUS | Status: AC
  Administered 2022-08-11: 3 g via INTRAVENOUS
  Filled 2022-08-11: qty 100

## 2022-08-11 MED ORDER — MIDAZOLAM HCL 5 MG/5ML IJ SOLN
INTRAMUSCULAR | Status: DC | PRN
  Administered 2022-08-11 (×2): 2 mg via INTRAVENOUS

## 2022-08-11 MED ORDER — LACTATED RINGERS IV BOLUS
250.0000 mL | Freq: Once | INTRAVENOUS | Status: AC
  Administered 2022-08-11: 250 mL via INTRAVENOUS

## 2022-08-11 MED ORDER — METHOCARBAMOL 500 MG IVPB - SIMPLE MED
INTRAVENOUS | Status: AC
  Administered 2022-08-11: 500 mg via INTRAVENOUS
  Filled 2022-08-11: qty 55

## 2022-08-11 MED ORDER — RIVAROXABAN 10 MG PO TABS: 10.0000 mg | ORAL_TABLET | Freq: Every day | ORAL | 0 refills | Status: DC

## 2022-08-11 MED ORDER — STERILE WATER FOR IRRIGATION IR SOLN
Status: DC | PRN
  Administered 2022-08-11: 2000 mL

## 2022-08-11 MED ORDER — OXYCODONE HCL 5 MG PO TABS
ORAL_TABLET | ORAL | Status: AC
  Administered 2022-08-11: 5 mg via ORAL
  Filled 2022-08-11: qty 1

## 2022-08-11 MED ORDER — EPHEDRINE SULFATE-NACL 50-0.9 MG/10ML-% IV SOSY
PREFILLED_SYRINGE | INTRAVENOUS | Status: DC | PRN
  Administered 2022-08-11: 5 mg via INTRAVENOUS

## 2022-08-11 MED ORDER — METHOCARBAMOL 500 MG PO TABS: 500.0000 mg | ORAL_TABLET | Freq: Four times a day (QID) | ORAL | Status: DC | PRN

## 2022-08-11 MED ORDER — OXYCODONE HCL 5 MG/5ML PO SOLN: 5.0000 mg | Freq: Once | ORAL | Status: AC | PRN

## 2022-08-11 MED ORDER — CELECOXIB 200 MG PO CAPS: 200.0000 mg | ORAL_CAPSULE | Freq: Two times a day (BID) | ORAL | 0 refills | Status: AC

## 2022-08-11 MED ORDER — MEPERIDINE HCL 50 MG/ML IJ SOLN: 6.2500 mg | INTRAMUSCULAR | Status: DC | PRN

## 2022-08-11 MED ORDER — FENTANYL CITRATE PF 50 MCG/ML IJ SOSY
PREFILLED_SYRINGE | INTRAMUSCULAR | Status: AC
  Administered 2022-08-11: 50 ug via INTRAVENOUS
  Filled 2022-08-11: qty 1

## 2022-08-11 MED ORDER — PROPOFOL 10 MG/ML IV BOLUS
INTRAVENOUS | Status: DC | PRN
  Administered 2022-08-11 (×2): 30 mg via INTRAVENOUS

## 2022-08-11 MED ORDER — KETOROLAC TROMETHAMINE 30 MG/ML IJ SOLN
INTRAMUSCULAR | Status: AC
  Filled 2022-08-11: qty 1

## 2022-08-11 MED ORDER — FENTANYL CITRATE (PF) 100 MCG/2ML IJ SOLN
INTRAMUSCULAR | Status: AC
  Filled 2022-08-11: qty 2

## 2022-08-11 MED ORDER — KETOROLAC TROMETHAMINE 30 MG/ML IJ SOLN
INTRAMUSCULAR | Status: DC | PRN
  Administered 2022-08-11: 30 mg

## 2022-08-11 MED ORDER — PROPOFOL 500 MG/50ML IV EMUL
INTRAVENOUS | Status: AC
  Filled 2022-08-11: qty 50

## 2022-08-11 MED ORDER — SODIUM CHLORIDE (PF) 0.9 % IJ SOLN
INTRAMUSCULAR | Status: DC | PRN
  Administered 2022-08-11: 30 mL

## 2022-08-11 SURGICAL SUPPLY — 41 items
ADH SKN CLS APL DERMABOND .7 (GAUZE/BANDAGES/DRESSINGS) ×1
BAG COUNTER SPONGE SURGICOUNT (BAG) IMPLANT
BAG DECANTER FOR FLEXI CONT (MISCELLANEOUS) IMPLANT
BAG SPEC THK2 15X12 ZIP CLS (MISCELLANEOUS)
BAG SPNG CNTER NS LX DISP (BAG)
BAG ZIPLOCK 12X15 (MISCELLANEOUS) IMPLANT
BLADE SAG 18X100X1.27 (BLADE) ×1 IMPLANT
COVER PERINEAL POST (MISCELLANEOUS) ×1 IMPLANT
COVER SURGICAL LIGHT HANDLE (MISCELLANEOUS) ×1 IMPLANT
CUP ACETBLR 52 OD PINNACLE (Hips) IMPLANT
DERMABOND ADVANCED .7 DNX12 (GAUZE/BANDAGES/DRESSINGS) ×1 IMPLANT
DRAPE FOOT SWITCH (DRAPES) ×1 IMPLANT
DRAPE STERI IOBAN 125X83 (DRAPES) ×1 IMPLANT
DRAPE U-SHAPE 47X51 STRL (DRAPES) ×2 IMPLANT
DRESSING AQUACEL AG SP 3.5X10 (GAUZE/BANDAGES/DRESSINGS) ×1 IMPLANT
DRSG AQUACEL AG ADV 3.5X10 (GAUZE/BANDAGES/DRESSINGS) IMPLANT
DRSG AQUACEL AG SP 3.5X10 (GAUZE/BANDAGES/DRESSINGS) ×1
DURAPREP 26ML APPLICATOR (WOUND CARE) ×1 IMPLANT
ELECT REM PT RETURN 15FT ADLT (MISCELLANEOUS) ×1 IMPLANT
GLOVE BIO SURGEON STRL SZ 6 (GLOVE) ×1 IMPLANT
GLOVE BIOGEL PI IND STRL 6.5 (GLOVE) ×1 IMPLANT
GLOVE BIOGEL PI IND STRL 7.5 (GLOVE) ×1 IMPLANT
GLOVE ORTHO TXT STRL SZ7.5 (GLOVE) ×2 IMPLANT
GOWN STRL REUS W/ TWL LRG LVL3 (GOWN DISPOSABLE) ×2 IMPLANT
GOWN STRL REUS W/TWL LRG LVL3 (GOWN DISPOSABLE) ×2
HEAD CERAMIC 36 PLUS5 (Hips) IMPLANT
HOLDER FOLEY CATH W/STRAP (MISCELLANEOUS) ×1 IMPLANT
KIT TURNOVER KIT A (KITS) IMPLANT
LINER NEUTRAL 52X36MM PLUS 4 (Liner) IMPLANT
PACK ANTERIOR HIP CUSTOM (KITS) ×1 IMPLANT
SCREW 6.5MMX25MM (Screw) IMPLANT
STEM FEMORAL SZ6 HIGH ACTIS (Stem) IMPLANT
SUT MNCRL AB 4-0 PS2 18 (SUTURE) ×1 IMPLANT
SUT STRATAFIX 0 PDS 27 VIOLET (SUTURE) ×1
SUT VIC AB 1 CT1 36 (SUTURE) ×3 IMPLANT
SUT VIC AB 2-0 CT1 27 (SUTURE) ×2
SUT VIC AB 2-0 CT1 TAPERPNT 27 (SUTURE) ×2 IMPLANT
SUTURE STRATFX 0 PDS 27 VIOLET (SUTURE) ×1 IMPLANT
TRAY FOLEY MTR SLVR 16FR STAT (SET/KITS/TRAYS/PACK) IMPLANT
TUBE SUCTION HIGH CAP CLEAR NV (SUCTIONS) ×1 IMPLANT
WATER STERILE IRR 1000ML POUR (IV SOLUTION) ×1 IMPLANT

## 2022-08-11 NOTE — Op Note (Signed)
NAME:  Destiny Dennis                ACCOUNT NO.: [id number]      MEDICAL RECORD NO.: [id number]      FACILITY:  [facility]      PHYSICIAN:  Mauri Pole  DATE OF BIRTH:  April 16, 1961     DATE OF PROCEDURE:  08/11/2022                                 OPERATIVE REPORT         PREOPERATIVE DIAGNOSIS: Right  hip osteoarthritis.      POSTOPERATIVE DIAGNOSIS:  Right hip osteoarthritis.      PROCEDURE:  Right total hip replacement through an anterior approach   utilizing DePuy THR system, component size 52 mm pinnacle cup, a size 36+4 neutral   Altrex liner, a size 6 Hi Actis stem with a 36+5  delta ceramic   ball.      SURGEON:  Pietro Cassis. Alvan Dame, M.D.      ASSISTANT:  Costella Hatcher, PA-C     ANESTHESIA:  Spinal.      SPECIMENS:  None.      COMPLICATIONS:  None.      BLOOD LOSS:  600 cc     DRAINS:  None.      INDICATION OF THE PROCEDURE:  Destiny Dennis is a 61 y.o. female who had   presented to office for evaluation of right hip pain.  Radiographs revealed   progressive degenerative changes with bone-on-bone   articulation of the  hip joint, including subchondral cystic changes and osteophytes.  The patient had painful limited range of   motion significantly affecting their overall quality of life and function.  The patient was failing to    respond to conservative measures including medications and/or injections and activity modification and at this point was ready   to proceed with more definitive measures.  Consent was obtained for   benefit of pain relief.  Specific risks of infection, DVT, component   failure, dislocation, neurovascular injury, and need for revision surgery were reviewed in the office.     PROCEDURE IN DETAIL:  The patient was brought to operative theater.   Once adequate anesthesia, preoperative antibiotics, 2 gm of Ancef, 1 gm of Tranexamic Acid, and 10 mg of Decadron were administered, the patient was positioned supine on the  [employer] table.  Once the patient was safely positioned with adequate padding of boney prominences we predraped out the hip, and used fluoroscopy to confirm orientation of the pelvis.      The right hip was then prepped and draped from proximal iliac crest to   mid thigh with a shower curtain technique.      Time-out was performed identifying the patient, planned procedure, and the appropriate extremity.     An incision was then made 2 cm lateral to the   anterior superior iliac spine extending over the orientation of the   tensor fascia lata muscle and sharp dissection was carried down to the   fascia of the muscle.      The fascia was then incised.  The muscle belly was identified and swept   laterally and retractor placed along the superior neck.  Following   cauterization of the circumflex vessels and removing some pericapsular   fat, a second cobra retractor was placed on the  inferior neck.  A T-capsulotomy was made along the line of the   superior neck to the trochanteric fossa, then extended proximally and   distally.  Tag sutures were placed and the retractors were then placed   intracapsular.  We then identified the trochanteric fossa and   orientation of my neck cut and then made a neck osteotomy with the femur on traction.  The femoral   head was removed without difficulty or complication.  Traction was let   off and retractors were placed posterior and anterior around the   acetabulum.      The labrum and foveal tissue were debrided.  I began reaming with a 45 mm   reamer and reamed up to 51 mm reamer with good bony bed preparation and a 52 mm  cup was chosen.  The final 52 mm Pinnacle cup was then impacted under fluoroscopy to confirm the depth of penetration and orientation with respect to   Abduction and forward flexion.  A screw was placed into the ilium followed by the hole eliminator.  The final   36+4 neutral Altrex liner was impacted with good visualized rim fit.  The  cup was positioned anatomically within the acetabular portion of the pelvis.      At this point, the femur was rolled to 100 degrees.  Further capsule was   released off the inferior aspect of the femoral neck.  I then   released the superior capsule proximally.  With the leg in a neutral position the hook was placed laterally   along the femur under the vastus lateralis origin and elevated manually and then held in position using the hook attachment on the bed.  The leg was then extended and adducted with the leg rolled to 100   degrees of external rotation.  Retractors were placed along the medial calcar and posteriorly over the greater trochanter.  Once the proximal femur was fully   exposed, I used a box osteotome to set orientation.  I then began   broaching with the starting chili pepper broach and passed this by hand and then broached up to 6.  With the 6 broach in place I chose a high offset neck and did several trial reductions.  The offset was appropriate, leg lengths   appeared to be equal best matched with the +5 head ball trial confirmed radiographically.   Given these findings, I went ahead and dislocated the hip, repositioned all   retractors and positioned the right hip in the extended and abducted position.  The final 6 Hi Actis stem was   chosen and it was impacted down to the level of neck cut.  Based on this   and the trial reductions, a final 36+5 delta ceramic ball was chosen and   impacted onto a clean and dry trunnion, and the hip was reduced.  The   hip had been irrigated throughout the case again at this point.  I did   reapproximate the superior capsular leaflet to the anterior leaflet   using #1 Vicryl.  The fascia of the   tensor fascia lata muscle was then reapproximated using #1 Vicryl and #0 Stratafix sutures.  The   remaining wound was closed with 2-0 Vicryl and running 4-0 Monocryl.   The hip was cleaned, dried, and dressed sterilely using Dermabond and    Aquacel dressing.  The patient was then brought   to recovery room in stable condition tolerating the procedure well.  Costella Hatcher, PA-C was present for the entirety of the case involved from   preoperative positioning, perioperative retractor management, general   facilitation of the case, as well as primary wound closure as assistant.            Pietro Cassis Alvan Dame, M.D.        08/11/2022 10:33 AM

## 2022-08-11 NOTE — H&P (Signed)
TOTAL HIP ADMISSION H&P  Patient is admitted for right total hip arthroplasty.  Subjective:  Chief Complaint: right hip pain  HPI: Destiny Dennis, 61 y.o. female, has a history of pain and functional disability in the right hip(s) due to arthritis and patient has failed non-surgical conservative treatments for greater than 12 weeks to include NSAID's and/or analgesics and activity modification.  Onset of symptoms was gradual starting 2 years ago with gradually worsening course since that time.The patient noted no past surgery on the right hip(s).  Patient currently rates pain in the right hip at 8 out of 10 with activity. Patient has worsening of pain with activity and weight bearing, pain that interfers with activities of daily living, and pain with passive range of motion. Patient has evidence of joint space narrowing by imaging studies. This condition presents safety issues increasing the risk of falls. There is no current active infection.  Patient Active Problem List   Diagnosis Date Noted   Hx of adenomatous polyp of colon 12/21/2021   Pacemaker 12/05/2021   Fatty liver 12/05/2021   Hyperlipidemia, unspecified 12/05/2021   Tibialis posterior tendon tear, nontraumatic, left 04/29/2017   Flat foot 04/29/2017   Leg length discrepancy 04/29/2017   S/P Maze operation for atrial fibrillation + closure ASD 05/28/2016   Cardiomyopathy ([place])    Atrial septal defect 01/01/2016   Arthritis, senescent 06/28/2015   History of DVT (deep vein thrombosis) 10/24/2013   SVT (supraventricular tachycardia) 02/11/2011   Atrial fibrillation ([place]) 03/05/2007   Past Medical History:  Diagnosis Date   Allergic rhinitis    Anxiety    when tachycardia occurs   Arthritis    OA, s/p numerous surgeries -back, knees, hip   Benign fundic gland polyps of stomach    Cervical disc disease    Diverticulitis    CT Scan    DVT (deep venous thrombosis) ([place])    in pregnancy, s/p hip surgery   Fatty liver     GERD 12/23/2009   diet related-not a problem   Hemorrhoids    Hiatal hernia    small   History of loop recorder    placed then removed   History of MRSA infection 2008   superficial skin-cleared easily with doxycycline   Hx of adenomatous polyp of colon 12/21/2021   Obesity    Persistent atrial fibrillation ([place]) 03/05/2007   a. s/p RFCA 12/18/2011, 07/29/12   Post-operative nausea and vomiting    Presence of permanent cardiac pacemaker 06/08/2016   s/p atrial septal defect repair 05/28/2016   S/P Maze operation for atrial fibrillation 05/28/2016   Complete bilateral atrial lesion set using cryothermy and bipolar radiofrequency ablation with clipping of LA appendage via median sternotomy   Sialoadenitis    Tachy-brady syndrome ([place])     Past Surgical History:  Procedure Laterality Date   ASD REPAIR N/A 05/28/2016   Procedure: ATRIAL SEPTAL DEFECT (ASD) closure;  Surgeon: Rexene Alberts, MD;  Location: [place];  Service: Open Heart Surgery;  Laterality: N/A;   ATRIAL FIBRILLATION ABLATION N/A 12/18/2011   Procedure: ATRIAL FIBRILLATION ABLATION;  Surgeon: [name], MD;  Location: [facility] CATH LAB;  Service: Cardiovascular;  Laterality: N/A;   ATRIAL FIBRILLATION ABLATION N/A 07/29/2012   Procedure: ATRIAL FIBRILLATION ABLATION;  Surgeon: [name], MD;  Location: [facility] CATH LAB;  Service: Cardiovascular;  Laterality: N/A;   CARDIAC CATHETERIZATION N/A 04/10/2016   Procedure: Right/Left Heart Cath and Coronary Angiography;  Surgeon: Jettie Booze, MD;  Location:  [place] INVASIVE CV LAB;  Service: Cardiovascular;  Laterality: N/A;   CERVICAL FUSION  2010   CESAREAN SECTION     x 2   CHOLECYSTECTOMY  2006   CLIPPING OF ATRIAL APPENDAGE  05/28/2016   Procedure: CLIPPING OF ATRIAL APPENDAGE;  Surgeon: Rexene Alberts, MD;  Location: [place];  Service: Open Heart Surgery;;   COLONOSCOPY  07/16/2011   diverticulosis   ELBOW SURGERY     Right   EP IMPLANTABLE DEVICE N/A 09/10/2015    Procedure: Loop Recorder Insertion;  Surgeon: [name], MD;  Location: [place] CV LAB;  Service: Cardiovascular;  Laterality: N/A;   EP IMPLANTABLE DEVICE N/A 06/08/2016   MDT Marcelino Scot CRT-P implanted by Dr Curt Bears   FLEXIBLE SIGMOIDOSCOPY     [phone]   [place] Left 12/12/2012   Procedure: LEFT KNEE EXCISION SAPHENOUS NEUROMA/OPEN SCAR DEBRIDEMENT/POLY EXCHANGE/NERVE EXCISION;  Surgeon: Mauri Pole, MD;  Location: [facility];  Service: Orthopedics;  Laterality: Left;   JOINT REPLACEMENT Bilateral    4'14  knees   KNEE SURGERY     x 9 Left Knee   MAZE  05/28/2016   Procedure: MAZE;  Surgeon: Rexene Alberts, MD;  Location: [place];  Service: Open Heart Surgery;;   MEDIASTERNOTOMY N/A 05/28/2016   Procedure: MEDIAN STERNOTOMY;  Surgeon: Rexene Alberts, MD;  Location: [place];  Service: Open Heart Surgery;  Laterality: N/A;   RADIOACTIVE SEED GUIDED EXCISIONAL BREAST BIOPSY Right 03/26/2022   Procedure: RIGHT BREAST SEED GUIDED EXCISIONAL BIOPSY;  Surgeon: Rolm Bookbinder, MD;  Location: [place];  Service: General;  Laterality: Right;  LMA   REDUCTION MAMMAPLASTY Bilateral    SHOULDER SURGERY     Right    TEE WITHOUT CARDIOVERSION  12/17/2011   Procedure: TRANSESOPHAGEAL ECHOCARDIOGRAM (TEE);  Surgeon: Larey Dresser, MD;  Location[facility] ENDOSCOPY;  Service: Cardiovascular;  Laterality: N/A;   TEE WITHOUT CARDIOVERSION  07/28/2012   Procedure: TRANSESOPHAGEAL ECHOCARDIOGRAM (TEE);  Surgeon: Thayer Headings, MD;  Location: [place];  Service: Cardiovascular;  Laterality: N/A;   TEE WITHOUT CARDIOVERSION N/A 01/01/2016   Procedure: TRANSESOPHAGEAL ECHOCARDIOGRAM (TEE);  Surgeon: Sanda Klein, MD;  Location[facility] ENDOSCOPY;  Service: Cardiovascular;  Laterality: N/A;   TEE WITHOUT CARDIOVERSION N/A 05/28/2016   Procedure: TRANSESOPHAGEAL ECHOCARDIOGRAM (TEE);  Surgeon: Rexene Alberts, MD;  Location: [place];   Service: Open Heart Surgery;  Laterality: N/A;   TOTAL HIP ARTHROPLASTY Left 10/17/2013   Procedure: LEFT TOTAL HIP ARTHROPLASTY ANTERIOR APPROACH;  Surgeon: Mauri Pole, MD;  Location: [facility];  Service: Orthopedics;  Laterality: Left;   TOTAL KNEE ARTHROPLASTY Right 12/12/2012   Procedure: RIGHT TOTAL KNEE ARTHROPLASTY;  Surgeon: Mauri Pole, MD;  Location: [facility];  Service: Orthopedics;  Laterality: Right;   TUBAL LIGATION  2005   UPPER GASTROINTESTINAL ENDOSCOPY  01/30/2010   hiatal hernia, fundic gland polyps   WRIST SURGERY     Left     Current Facility-Administered Medications  Medication Dose Route Frequency Provider Last Rate Last Admin   ceFAZolin (ANCEF) IVPB 3g/100 mL premix  3 g Intravenous On Call to OR [place] Copas, PA-C       chlorhexidine (PERIDEX) 0.12 % solution 15 mL  15 mL Mouth/Throat Once Lidia Collum, MD       Or   Oral care mouth rinse  15 mL Mouth Rinse Once  Lidia Collum, MD       dexamethasone (DECADRON) injection 8 mg  8 mg Intravenous Once [place] Copas, PA-C       lactated ringers infusion   Intravenous Continuous [place] Copas, PA-C       lactated ringers infusion   Intravenous Continuous Lidia Collum, MD       povidone-iodine 10 % swab 2 Application  2 Application Topical Once [place] Copas, PA-C       tranexamic acid (CYKLOKAPRON) IVPB 1,000 mg  1,000 mg Intravenous To OR [place] Copas, PA-C       Allergies  Allergen Reactions   Avelox [Moxifloxacin Hcl In Nacl] Other (See Comments)    Numbness & tingling Peripheral neuropathy    Social History   Tobacco Use   Smoking status: Never   Smokeless tobacco: Never  Substance Use Topics   Alcohol use: Yes    Alcohol/week: 1.0 standard drink of alcohol    Types: 1 Glasses of wine per week    Family History  Problem Relation Age of Onset   Diabetes Mother    Hyperlipidemia Mother    Other Mother        covid complications- 12   Liver disease Father     Obesity Father    Other Father        covid complications- 61   Diabetes Maternal Grandfather    Cervical cancer Maternal Grandfather    Breast cancer Paternal Grandmother    Diverticulitis Paternal Grandmother    Diverticulitis Brother        x 2   Atrial fibrillation Brother    Arthritis Brother        2 knee replacements   Healthy Brother        doesnt see doctor much   Colon cancer Neg Hx    Esophageal cancer Neg Hx    Rectal cancer Neg Hx    Stomach cancer Neg Hx    Colon polyps Neg Hx      Review of Systems  Constitutional:  Negative for chills and fever.  Respiratory:  Negative for cough and shortness of breath.   Cardiovascular:  Negative for chest pain.  Gastrointestinal:  Negative for nausea and vomiting.  Musculoskeletal:  Positive for arthralgias.     Objective:  Physical Exam BMI noted to be 31.2 Very pleasant 61 year old female awake alert and oriented. She presents today for evaluation with her husband for her right hip. She has been using a cane for mobilization assistance due to pain.  Right hip exam: Painful and limited right hip flexion internal rotation to 5 degrees with external Tatian of 20 degrees both reproducing pain over the anterior aspect of the joint. No significant external rotation contracture however she does have limitations with active hip flexion and abduction at 5 -/5 strength due to pain Neurovascular intact distally Left hip exam status post prior arthroplasty reveals a very fluid range of motion without reproducible pain  Vital signs in last 24 hours: Temp:  [98.7 F (37.1 C)] 98.7 F (37.1 C) (12/26 0641) Pulse Rate:  [63] 63 (12/26 0641) Resp:  [18] 18 (12/26 0641) BP: (167)/(97) 167/97 (12/26 0641) SpO2:  [95 %] 95 % (12/26 0641) Weight:  [119.5 kg] 119.5 kg (12/26 0652)  Labs:   Estimated body mass index is 36.75 kg/m as calculated from the following:   Height as of this encounter: '5\' 11"'$  (1.803 m).   Weight as of  this encounter:  119.5 kg.   Imaging Review Plain radiographs demonstrate severe degenerative joint disease of the right hip(s). The bone quality appears to be adequate for age and reported activity level.      Assessment/Plan:  End stage arthritis, right hip(s)  The patient history, physical examination, clinical judgement of the provider and imaging studies are consistent with end stage degenerative joint disease of the right hip(s) and total hip arthroplasty is deemed medically necessary. The treatment options including medical management, injection therapy, arthroscopy and arthroplasty were discussed at length. The risks and benefits of total hip arthroplasty were presented and reviewed. The risks due to aseptic loosening, infection, stiffness, dislocation/subluxation,  thromboembolic complications and other imponderables were discussed.  The patient acknowledged the explanation, agreed to proceed with the plan and consent was signed. Patient is being admitted for inpatient treatment for surgery, pain control, PT, OT, prophylactic antibiotics, VTE prophylaxis, progressive ambulation and ADL's and discharge planning.The patient is planning to be discharged  home.  Therapy Plans: HEP Disposition: Home with husband Planned DVT Prophylaxis: Xarelto '10mg'$  daily DME needed: walker PCP: Dr. 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

## 2022-08-11 NOTE — Evaluation (Signed)
Physical Therapy Evaluation Patient Details Name: Destiny Dennis MRN: [id number] DOB: 1960/11/07 Today's Date: 08/11/2022  History of Present Illness  Pt is a 61yo female presenting s/p R-THAAA on 08/11/22. PMH: hx of DVT, GERD, PAF s/p Maze, pacemaker, cervical fusion, bilateral TKAs 2014, L-THA 2015  Clinical Impression  Destiny Dennis is a 61 y.o. female POD 0 s/p R-THA, AA. Patient reports modified independence using SPC with mobility at baseline. Patient is now limited by functional impairments (see PT problem list below) and requires min guard for transfers and gait with RW. Patient was able to ambulate 120 feet with RW and min guard and cues for safe walker management. Patient educated on safe sequencing for stair mobility and verbalized safe guarding position for people assisting with mobility. Patient instructed in exercises to facilitate ROM and circulation. Patient will benefit from continued skilled PT interventions to address impairments and progress towards PLOF. Patient has met mobility goals at adequate level for discharge home; will continue to follow if pt continues acute stay to progress towards Mod I goals.       Recommendations for follow up therapy are one component of a multi-disciplinary discharge planning process, led by the attending physician.  Recommendations may be updated based on patient status, additional functional criteria and insurance authorization.  Follow Up Recommendations Follow physician's recommendations for discharge plan and follow up therapies      Assistance Recommended at Discharge Frequent or constant Supervision/Assistance  Patient can return home with the following  A little help with walking and/or transfers;A little help with bathing/dressing/bathroom;Assistance with cooking/housework;Assist for transportation;Help with stairs or ramp for entrance    Equipment Recommendations Rolling walker (2 wheels)  Recommendations for Other Services        Functional Status Assessment Patient has had a recent decline in their functional status and demonstrates the ability to make significant improvements in function in a reasonable and predictable amount of time.     Precautions / Restrictions Precautions Precautions: None Restrictions Weight Bearing Restrictions: No Other Position/Activity Restrictions: wbat      Mobility  Bed Mobility Overal bed mobility: Modified Independent             General bed mobility comments: increased time    Transfers Overall transfer level: Needs assistance Equipment used: Rolling walker (2 wheels) Transfers: Sit to/from Stand Sit to Stand: Supervision, From elevated surface           General transfer comment: For safety only    Ambulation/Gait Ambulation/Gait assistance: Min guard Gait Distance (Feet): 120 Feet Assistive device: Rolling walker (2 wheels) Gait Pattern/deviations: WFL(Within Functional Limits) Gait velocity: decreased     General Gait Details: Pt ambulated with RW and min guard, no physical assist required or overt LOB noted.  Stairs Stairs: Yes Stairs assistance: Min assist Stair Management: No rails, Step to pattern, Backwards, With walker Number of Stairs: 3 General stair comments: Pt completed stair training with min assist for steadying of RW, no overt LOB noted, VCs for sequencing.  Wheelchair Mobility    Modified Rankin (Stroke Patients Only)       Balance Overall balance assessment: Needs assistance Sitting-balance support: Feet supported, No upper extremity supported Sitting balance-Leahy Scale: Good     Standing balance support: Reliant on assistive device for balance, During functional activity, Bilateral upper extremity supported Standing balance-Leahy Scale: Poor  Pertinent Vitals/Pain Pain Assessment Pain Assessment: 0-10 Pain Score: 2  Pain Location: right hip Pain Descriptors /  Indicators: Operative site guarding Pain Intervention(s): Limited activity within patient's tolerance, Monitored during session, Repositioned    Home Living Family/patient expects to be discharged to:: Private residence Living Arrangements: Spouse/significant other Available Help at Discharge: Family;Available 24 hours/day Type of Home: House Home Access: Stairs to enter Entrance Stairs-Rails: None Entrance Stairs-Number of Steps: 4 Alternate Level Stairs-Number of Steps: flight Home Layout: Two level;1/2 bath on main level;Bed/bath upstairs Home Equipment: Shower seat - built in;Cane - single point      Prior Function Prior Level of Function : Independent/Modified Independent;Driving;Working/employed (Medical case management)             Mobility Comments: SPC at all times ADLs Comments: IND except for donning/doffing socks/shoes on RLE     Hand Dominance        Extremity/Trunk Assessment   Upper Extremity Assessment Upper Extremity Assessment: Overall WFL for tasks assessed    Lower Extremity Assessment Lower Extremity Assessment: RLE deficits/detail;LLE deficits/detail RLE Deficits / Details: MMT ank DF/PF 5/5 RLE Sensation: WNL LLE Deficits / Details: MMT ank DF/PF 5/5 LLE Sensation: WNL    Cervical / Trunk Assessment Cervical / Trunk Assessment: Normal  Communication   Communication: No difficulties  Cognition Arousal/Alertness: Awake/alert Behavior During Therapy: WFL for tasks assessed/performed Overall Cognitive Status: Within Functional Limits for tasks assessed                                          General Comments      Exercises Total Joint Exercises Ankle Circles/Pumps: AROM, Both, 20 reps Quad Sets: AROM, Right, Other reps (comment) (2) Short Arc Quad: AROM, Right, Other (comment) (2) Heel Slides: AROM, Right, 5 reps Hip ABduction/ADduction: AROM, Right, Other reps (comment), Supine, Standing (2; STANDING: educated not  performed) Long Arc Quad: Seated (educated not performed) Knee Flexion: Standing (educated not performed) Marching in Standing: Other (comment) (educated not performed) Standing Hip Extension: Other (comment) (educated not performed)   Assessment/Plan    PT Assessment Patient needs continued PT services  PT Problem List Decreased strength;Decreased range of motion;Decreased activity tolerance;Decreased balance;Decreased mobility;Decreased coordination;Pain       PT Treatment Interventions DME instruction;Gait training;Stair training;Functional mobility training;Therapeutic activities;Therapeutic exercise;Balance training;Neuromuscular re-education;Patient/family education    PT Goals (Current goals can be found in the Care Plan section)  Acute Rehab PT Goals Patient Stated Goal: Return to pickleball PT Goal Formulation: With patient Time For Goal Achievement: 08/18/22 Potential to Achieve Goals: Good    Frequency 7X/week     Co-evaluation               AM-PAC PT "6 Clicks" Mobility  Outcome Measure Help needed turning from your back to your side while in a flat bed without using bedrails?: None Help needed moving from lying on your back to sitting on the side of a flat bed without using bedrails?: A Little Help needed moving to and from a bed to a chair (including a wheelchair)?: A Little Help needed standing up from a chair using your arms (e.g., wheelchair or bedside chair)?: A Little Help needed to walk in hospital room?: A Little Help needed climbing 3-5 steps with a railing? : A Little 6 Click Score: 19    End of Session Equipment Utilized During Treatment: Gait belt Activity Tolerance:  Patient tolerated treatment well;No increased pain Patient left: in bed;with call bell/phone within reach (stretcher in PACU) Nurse Communication: Mobility status PT Visit Diagnosis: Pain;Difficulty in walking, not elsewhere classified (R26.2) Pain - Right/Left: Right Pain -  part of body: Hip    Time: 1315-1345 PT Time Calculation (min) (ACUTE ONLY): 30 min   Charges:   PT Evaluation $PT Eval Low Complexity: 1 Low PT Treatments $Gait Training: 8-22 mins        Coolidge Breeze, PT, DPT [facility] Office: [phone] Weekend pager: [phone]  Coolidge Breeze 08/11/2022, 1:53 PM

## 2022-08-11 NOTE — Anesthesia Postprocedure Evaluation (Signed)
Anesthesia Post Note  Patient: Destiny Dennis  Procedure(s) Performed: TOTAL HIP ARTHROPLASTY ANTERIOR APPROACH (Right: Hip)     Patient location during evaluation: PACU Anesthesia Type: Spinal Level of consciousness: awake Pain management: pain level controlled Vital Signs Assessment: post-procedure vital signs reviewed and stable Respiratory status: spontaneous breathing Cardiovascular status: stable Postop Assessment: no apparent nausea or vomiting, no backache, spinal receding, no headache and patient able to bend at knees Anesthetic complications: no  No notable events documented.  Last Vitals:  Vitals:   08/11/22 1200 08/11/22 1215  BP: 130/66   Pulse: 62 66  Resp: 19 14  Temp:    SpO2: 96% 94%    Last Pain:  Vitals:   08/11/22 1215  TempSrc:   PainSc: 4     LLE Motor Response: Purposeful movement (08/11/22 1215)   RLE Motor Response: Purposeful movement (08/11/22 1215)   L Sensory Level: S1-Sole of foot, small toes (08/11/22 1215) R Sensory Level: S1-Sole of foot, small toes (08/11/22 1215)  Huston Foley

## 2022-08-11 NOTE — Transfer of Care (Signed)
Immediate Anesthesia Transfer of Care Note  Patient: RUMI KOLODZIEJ  Procedure(s) Performed: TOTAL HIP ARTHROPLASTY ANTERIOR APPROACH (Right: Hip)  Patient Location: PACU  Anesthesia Type:MAC and Spinal  Level of Consciousness: awake, alert , and patient cooperative  Airway & Oxygen Therapy: Patient Spontanous Breathing and Patient connected to face mask oxygen  Post-op Assessment: Report given to RN and Post -op Vital signs reviewed and stable  Post vital signs: Reviewed and stable  Last Vitals:  Vitals Value Taken Time  BP 139/75 08/11/22 1053  Temp    Pulse 61 08/11/22 1054  Resp 20 08/11/22 1054  SpO2 95 % 08/11/22 1054  Vitals shown include unvalidated device data.  Last Pain:  Vitals:   08/11/22 0652  TempSrc:   PainSc: 0-No pain      Patients Stated Pain Goal: 4 (06/12/24 3664)  Complications: No notable events documented.

## 2022-08-11 NOTE — Discharge Instructions (Signed)

## 2022-08-11 NOTE — Anesthesia Procedure Notes (Signed)
Spinal  Patient location during procedure: OR Start time: 08/11/2022 9:04 AM End time: 08/11/2022 9:07 AM Reason for block: surgical anesthesia Staffing Performed: resident/CRNA  Anesthesiologist: Lyn Hollingshead, MD Resident/CRNA: Claudia Desanctis, CRNA Performed by: Claudia Desanctis, CRNA Authorized by: Lyn Hollingshead, MD   Preanesthetic Checklist Completed: patient identified, IV checked, site marked, risks and benefits discussed, surgical consent, monitors and equipment checked, pre-op evaluation and timeout performed Spinal Block Patient position: sitting Prep: DuraPrep Patient monitoring: heart rate, cardiac monitor, continuous pulse ox and blood pressure Approach: midline Location: L3-4 Injection technique: single-shot Needle Needle type: Pencan  Needle gauge: 24 G Needle length: 10 cm Needle insertion depth: 9 cm Assessment Sensory level: T4 Events: CSF return

## 2022-08-11 NOTE — Anesthesia Preprocedure Evaluation (Signed)
Anesthesia Evaluation  Patient identified by MRN, date of birth, ID band Patient awake    Reviewed: Allergy & Precautions, NPO status , Patient's Chart, lab work & pertinent test results  History of Anesthesia Complications (+) PONV and history of anesthetic complications  Airway Mallampati: II  TM Distance: >3 FB Neck ROM: Full    Dental no notable dental hx. (+) Dental Advisory Given   Pulmonary neg pulmonary ROS   Pulmonary exam normal        Cardiovascular + DVT  Normal cardiovascular exam+ dysrhythmias + pacemaker   S/P Maze repair of ASD   Neuro/Psych  PSYCHIATRIC DISORDERS Anxiety        GI/Hepatic Neg liver ROS,,,  Endo/Other  negative endocrine ROS    Renal/GU negative Renal ROS  negative genitourinary   Musculoskeletal  (+) Arthritis , Osteoarthritis,    Abdominal  (+) + obese  Peds  Hematology negative hematology ROS (+)   Anesthesia Other Findings yChart Results Release  MyChart Status: Active  Results Release  Indications  Sick sinus syndrome ([place]) [I49.5 (ICD-10-CM)]  Conclusion  Scheduled remote reviewed. Normal device function.    Next remote 91 days. LA     Reproductive/Obstetrics                             Anesthesia Physical Anesthesia Plan  ASA: 3  Anesthesia Plan: Spinal   Post-op Pain Management:    Induction:   PONV Risk Score and Plan: 4 or greater and Ondansetron, Dexamethasone, TIVA and Midazolam  Airway Management Planned: Natural Airway and Simple Face Mask  Additional Equipment: None  Intra-op Plan:   Post-operative Plan: Extubation in OR  Informed Consent: I have reviewed the patients History and Physical, chart, labs and discussed the procedure including the risks, benefits and alternatives for the proposed anesthesia with the patient or authorized representative who has indicated his/her understanding and acceptance.        Plan Discussed with: CRNA  Anesthesia Plan Comments: (Spoke to [facility] with [employer] regarding pacemaker. No post-operative device check or reprogramming required.)        Anesthesia Quick Evaluation

## 2022-08-11 NOTE — Anesthesia Procedure Notes (Signed)
Procedure Name: MAC Date/Time: 08/11/2022 9:04 AM  Performed by: Claudia Desanctis, CRNAPre-anesthesia Checklist: Patient identified, Emergency Drugs available, Suction available and Patient being monitored Patient Re-evaluated:Patient Re-evaluated prior to induction Oxygen Delivery Method: Simple face mask

## 2022-08-14 ENCOUNTER — Encounter (HOSPITAL_COMMUNITY): Payer: Self-pay | Admitting: Orthopedic Surgery

## 2022-08-18 ENCOUNTER — Ambulatory Visit: Payer: 59 | Admitting: Dermatology

## 2022-08-21 ENCOUNTER — Ambulatory Visit: Payer: 59 | Admitting: Physician Assistant

## 2022-09-03 ENCOUNTER — Other Ambulatory Visit: Payer: Self-pay

## 2022-09-07 DIAGNOSIS — R001 Bradycardia, unspecified: Secondary | ICD-10-CM | POA: Insufficient documentation

## 2022-09-07 NOTE — Progress Notes (Signed)
Cardiology Office Note Date:  09/08/2022  Patient ID:  Destiny Dennis, Destiny Dennis 07-16-1961, MRN [id number] PCP:  Marin Olp, MD  Cardiologist:  None Electrophysiologist: [name], MD >> Will Meredith Leeds, MD   Chief Complaint: 1 year follow-up  History of Present Illness: Destiny Dennis is a 62 y.o. female with PMH notable for SND, NICM, tachy-brady syndrome s/p CRT, persistent Afib s/p MAZE and ASD repair; seen today for Will Meredith Leeds, MD for routine electrophysiology followup. Since last being seen in our clinic the patient reports doing very well from a cardiac standpoint. Last saw Dr. Rayann Heman 07/2021, doing well, had NSVT w palpitations; no med changes. She had a hip replacement in Dec 2023, was bothered by the hip for many months before surgery. Since hip pain started, has been less active and has noticed some weight gain. She is walking with cane now, walks without can around home.  Has very brief episodes of palpitations, last just a couple seconds.     she denies chest pain, dyspnea, PND, orthopnea, nausea, vomiting, dizziness, syncope, edema, weight gain, or early satiety.    Device Information: MDT CRT-P, imp 05/2016; dx NICM, HFmrEFpersis AF, tachy-brady  Past Medical History:  Diagnosis Date   Allergic rhinitis    Anxiety    when tachycardia occurs   Arthritis    OA, s/p numerous surgeries -back, knees, hip   Benign fundic gland polyps of stomach    Cervical disc disease    Diverticulitis    CT Scan    DVT (deep venous thrombosis) ([place])    in pregnancy, s/p hip surgery   Fatty liver    GERD 12/23/2009   diet related-not a problem   Hemorrhoids    Hiatal hernia    small   History of loop recorder    placed then removed   History of MRSA infection 2008   superficial skin-cleared easily with doxycycline   Hx of adenomatous polyp of colon 12/21/2021   Obesity    Persistent atrial fibrillation ([place]) 03/05/2007   a. s/p RFCA 12/18/2011, 07/29/12    Post-operative nausea and vomiting    Presence of permanent cardiac pacemaker 06/08/2016   s/p atrial septal defect repair 05/28/2016   S/P Maze operation for atrial fibrillation 05/28/2016   Complete bilateral atrial lesion set using cryothermy and bipolar radiofrequency ablation with clipping of LA appendage via median sternotomy   Sialoadenitis    Tachy-brady syndrome ([place])     Past Surgical History:  Procedure Laterality Date   ASD REPAIR N/A 05/28/2016   Procedure: ATRIAL SEPTAL DEFECT (ASD) closure;  Surgeon: Rexene Alberts, MD;  Location: [place];  Service: Open Heart Surgery;  Laterality: N/A;   ATRIAL FIBRILLATION ABLATION N/A 12/18/2011   Procedure: ATRIAL FIBRILLATION ABLATION;  Surgeon: [name], MD;  Location[facility] CATH LAB;  Service: Cardiovascular;  Laterality: N/A;   ATRIAL FIBRILLATION ABLATION N/A 07/29/2012   Procedure: ATRIAL FIBRILLATION ABLATION;  Surgeon: [name], MD;  Location[facility] CATH LAB;  Service: Cardiovascular;  Laterality: N/A;   CARDIAC CATHETERIZATION N/A 04/10/2016   Procedure: Right/Left Heart Cath and Coronary Angiography;  Surgeon: Jettie Booze, MD;  Location: [place] CV LAB;  Service: Cardiovascular;  Laterality: N/A;   CERVICAL FUSION  2010   CESAREAN SECTION     x 2   CHOLECYSTECTOMY  2006   CLIPPING OF ATRIAL APPENDAGE  05/28/2016   Procedure: CLIPPING OF ATRIAL APPENDAGE;  Surgeon: Rexene Alberts, MD;  Location:  [place] OR;  Service: Open Heart Surgery;;   COLONOSCOPY  07/16/2011   diverticulosis   ELBOW SURGERY     Right   EP IMPLANTABLE DEVICE N/A 09/10/2015   Procedure: Loop Recorder Insertion;  Surgeon: [name], MD;  Location: [place] CV LAB;  Service: Cardiovascular;  Laterality: N/A;   EP IMPLANTABLE DEVICE N/A 06/08/2016   MDT Marcelino Scot CRT-P implanted by Dr Curt Bears   FLEXIBLE SIGMOIDOSCOPY     [phone]   [place] Left 12/12/2012   Procedure: LEFT KNEE  EXCISION SAPHENOUS NEUROMA/OPEN SCAR DEBRIDEMENT/POLY EXCHANGE/NERVE EXCISION;  Surgeon: Mauri Pole, MD;  Location: [facility];  Service: Orthopedics;  Laterality: Left;   JOINT REPLACEMENT Bilateral    4'14  knees   KNEE SURGERY     x 9 Left Knee   MAZE  05/28/2016   Procedure: MAZE;  Surgeon: Rexene Alberts, MD;  Location: [place];  Service: Open Heart Surgery;;   MEDIASTERNOTOMY N/A 05/28/2016   Procedure: MEDIAN STERNOTOMY;  Surgeon: Rexene Alberts, MD;  Location: [place];  Service: Open Heart Surgery;  Laterality: N/A;   RADIOACTIVE SEED GUIDED EXCISIONAL BREAST BIOPSY Right 03/26/2022   Procedure: RIGHT BREAST SEED GUIDED EXCISIONAL BIOPSY;  Surgeon: Rolm Bookbinder, MD;  Location: [place];  Service: General;  Laterality: Right;  LMA   REDUCTION MAMMAPLASTY Bilateral    SHOULDER SURGERY     Right    TEE WITHOUT CARDIOVERSION  12/17/2011   Procedure: TRANSESOPHAGEAL ECHOCARDIOGRAM (TEE);  Surgeon: Larey Dresser, MD;  Location: [facility] ENDOSCOPY;  Service: Cardiovascular;  Laterality: N/A;   TEE WITHOUT CARDIOVERSION  07/28/2012   Procedure: TRANSESOPHAGEAL ECHOCARDIOGRAM (TEE);  Surgeon: Thayer Headings, MD;  Location: [place];  Service: Cardiovascular;  Laterality: N/A;   TEE WITHOUT CARDIOVERSION N/A 01/01/2016   Procedure: TRANSESOPHAGEAL ECHOCARDIOGRAM (TEE);  Surgeon: Sanda Klein, MD;  Location[facility] ENDOSCOPY;  Service: Cardiovascular;  Laterality: N/A;   TEE WITHOUT CARDIOVERSION N/A 05/28/2016   Procedure: TRANSESOPHAGEAL ECHOCARDIOGRAM (TEE);  Surgeon: Rexene Alberts, MD;  Location: [place];  Service: Open Heart Surgery;  Laterality: N/A;   TOTAL HIP ARTHROPLASTY Left 10/17/2013   Procedure: LEFT TOTAL HIP ARTHROPLASTY ANTERIOR APPROACH;  Surgeon: Mauri Pole, MD;  Location: [facility];  Service: Orthopedics;  Laterality: Left;   TOTAL HIP ARTHROPLASTY Right 08/11/2022   Procedure: TOTAL HIP ARTHROPLASTY ANTERIOR APPROACH;  Surgeon: Paralee Cancel, MD;   Location: [facility];  Service: Orthopedics;  Laterality: Right;   TOTAL KNEE ARTHROPLASTY Right 12/12/2012   Procedure: RIGHT TOTAL KNEE ARTHROPLASTY;  Surgeon: Mauri Pole, MD;  Location: [facility];  Service: Orthopedics;  Laterality: Right;   TUBAL LIGATION  2005   UPPER GASTROINTESTINAL ENDOSCOPY  01/30/2010   hiatal hernia, fundic gland polyps   WRIST SURGERY     Left     Current Outpatient Medications  Medication Instructions   acetaminophen (TYLENOL) 1,000 mg, Oral, Daily   Barberry-Oreg Grape-Goldenseal (BERBERINE COMPLEX PO) 1 tablet, Oral, 2 times daily   celecoxib (CELEBREX) 200 mg, Oral, 2 times daily   fluticasone (FLONASE) 50 MCG/ACT nasal spray 1 spray, Each Nare, Daily PRN   guaiFENesin-codeine 100-10 MG/5ML syrup 5 mLs, Oral, Every 6 hours PRN   HYDROcodone-acetaminophen (NORCO/VICODIN) 5-325 MG tablet 1 tablet, Oral, Every 4 hours PRN   ibuprofen (ADVIL) 800 mg, Oral, Every evening   MAGNESIUM PO 2 tablets, Oral, Daily at bedtime  metoprolol succinate (TOPROL-XL) 100 mg, Oral, Daily   Multiple Vitamin (MULTIVITAMIN) capsule 1 capsule, Oral, Daily   Omega-3 Fatty Acids (OMEGA 3 PO) 2 capsules, Oral, Daily   OVER THE COUNTER MEDICATION 4 capsules, Oral, Daily at bedtime, SERVITAL supplement   OVER THE COUNTER MEDICATION 2 capsules, Oral, Daily, ADIPROMIN weight loss supplement    polyethylene glycol (MIRALAX / GLYCOLAX) 17 g, Oral, 2 times daily   progesterone (PROMETRIUM) 100 mg, Oral, Daily at bedtime   rivaroxaban (XARELTO) 10 mg, Oral, Daily, Then take aspirin 81 mg twice daily for another month   sodium chloride (OCEAN) 0.65 % SOLN nasal spray 1 spray, Each Nare, 2 times daily   tiZANidine (ZANAFLEX) 4 mg, Oral, Every 8 hours PRN   TURMERIC PO 3 tablets, Oral, Daily      Social History:  The patient  reports that she has never smoked. She has never used smokeless tobacco. She reports current alcohol use of about 1.0 standard drink of alcohol per week. She reports  that she does not use drugs.   Family History:  The patient's family history includes Arthritis in her brother; Atrial fibrillation in her brother; Breast cancer in her paternal grandmother; Cervical cancer in her maternal grandfather; Diabetes in her maternal grandfather and mother; Diverticulitis in her brother and paternal grandmother; Healthy in her brother; Hyperlipidemia in her mother; Liver disease in her father; Obesity in her father; Other in her father and mother.  ROS:  Please see the history of present illness. All other systems are reviewed and otherwise negative.   PHYSICAL EXAM:  VS:  BP (!) 142/80   Pulse 67   Ht '5\' 11"'$  (1.803 m)   Wt 258 lb 3.2 oz (117.1 kg)   LMP 04/17/2012   SpO2 100%   BMI 36.01 kg/m  BMI: Body mass index is 36.01 kg/m.  GEN- The patient is well appearing, alert and oriented x 3 today.   HEENT: normocephalic, atraumatic; sclera clear, conjunctiva pink; hearing intact; oropharynx clear; neck supple, no JVP Lungs- Clear to ausculation bilaterally, normal work of breathing.  No wheezes, rales, rhonchi Heart- Regular rate and rhythm, no murmurs, rubs or gallops, PMI not laterally displaced GI- soft, non-tender, non-distended, bowel sounds present, no hepatosplenomegaly Extremities- Trace peripheral edema. no clubbing or cyanosis; DP/PT/radial pulses 2+ bilaterally MS- no significant deformity or atrophy Skin- warm and dry, no rash or lesion, device pocket well-healed Psych- euthymic mood, full affect Neuro- strength and sensation are intact   Device interrogation done today and reviewed by myself:  Battery good Lead thresholds, impedence, sensing stable  Brief NSVT episodes High BiV pacing percentage No changes made today  EKG is not ordered. Personal review of EKG from  07/30/22  shows: A-paced rate 71bpm; RBBB, narrow QRS  Recent Labs: 01/28/2022: ALT 15 05/06/2022: TSH 1.05 07/30/2022: BUN 8; Creatinine, Ser 0.55; Hemoglobin 12.3; Platelets  264; Potassium 3.9; Sodium 140  01/28/2022: Cholesterol 211; HDL 56.30; LDL Cholesterol 126; Total CHOL/HDL Ratio 4; Triglycerides 143.0; VLDL 28.6   CrCl cannot be calculated (Patient's most recent lab result is older than the maximum 21 days allowed.).   Wt Readings from Last 3 Encounters:  09/08/22 258 lb 3.2 oz (117.1 kg)  08/11/22 263 lb 8 oz (119.5 kg)  08/07/22 263 lb 8 oz (119.5 kg)     Additional studies reviewed include: Previous EP, cardiology notes.   TTE, 08/01/2020  1. Left ventricular ejection fraction, by estimation, is 55 to 60%. The left ventricle has normal  function. The left ventricle has no regional wall motion abnormalities. The left ventricular internal cavity size was  moderately dilated. Left ventricular diastolic parameters are indeterminate.   2. Right ventricular systolic function is mildly reduced. The right ventricular size is normal. There is moderately elevated pulmonary artery systolic pressure. The estimated right ventricular systolic pressure is 50.0 mmHg.   3. Left atrial size was severely dilated.   4. Right atrial size was severely dilated.   5. The mitral valve is grossly normal. Mild mitral valve regurgitation.   6. Tricuspid valve regurgitation is moderate.   7. The aortic valve is tricuspid. Aortic valve regurgitation is not visualized. No aortic stenosis is present.   8. There is mild dilatation of the ascending aorta, measuring 41 mm.   9. The inferior vena cava is normal in size with <50% respiratory variability, suggesting right atrial pressure of 8 mmHg.   Comparison(s): A prior study was performed on 06/23/2017. Increase in LV diameter, ascending aortic diameter, and tricuspid regurgitation from prior. Slight decrease in RV function.   Conclusion(s)/Recommendation(s): In futures studies, sonographer may attempt hepatic vein spectral Doppler.   ASSESSMENT AND PLAN:  #) sinus brady, tachy-brady, NICM s/p CRT-P Device functioning well, see  paceart for details Very brief NSVT episodes Patient is happy with level of control and symptoms  #) persistent AFib Well-controlled s/p ablation and LAA clip CHA2DS2-VASc Score = 2 [CHF History: 1, HTN History: 0, Diabetes History: 0, Stroke History: 0, Vascular Disease History: 0, Age Score: 0, Gender Score: 1].  Therefore, the patient's annual risk of stroke is 2.2 %.    Not on [place] s/p LAA clip  #) Elevated BP without diagnosis of HTN PCP is aware of elevated BP; per patient thinks that with increased exercise medication adjustments will not be needed - ongoing mgmt per PCP  #) transfer care Patient is aware that Dr. Rayann Heman is no longer seeing patients in our clinic. She requests to see Dr. Curt Bears since he implanted her device   Current medicines are reviewed at length with the patient today.   The patient does not have concerns regarding her medicines.  The following changes were made today:  none  Labs/ tests ordered today include:  Orders Placed This Encounter  Procedures   CUP [place]     Disposition: Follow up with Dr. Curt Bears in in 12 months   Signed, Mamie Levers, NP  09/08/22  1:12 PM  Electrophysiology [facility]

## 2022-09-08 ENCOUNTER — Encounter: Payer: Self-pay | Admitting: Student

## 2022-09-08 ENCOUNTER — Ambulatory Visit: Payer: 59 | Attending: Physician Assistant | Admitting: Cardiology

## 2022-09-08 VITALS — BP 142/80 | HR 67 | Ht 71.0 in | Wt 258.2 lb

## 2022-09-08 DIAGNOSIS — I428 Other cardiomyopathies: Secondary | ICD-10-CM

## 2022-09-08 DIAGNOSIS — R001 Bradycardia, unspecified: Secondary | ICD-10-CM

## 2022-09-08 DIAGNOSIS — I4819 Other persistent atrial fibrillation: Secondary | ICD-10-CM | POA: Diagnosis not present

## 2022-09-08 DIAGNOSIS — Z950 Presence of cardiac pacemaker: Secondary | ICD-10-CM

## 2022-09-08 DIAGNOSIS — I471 Supraventricular tachycardia, unspecified: Secondary | ICD-10-CM | POA: Diagnosis not present

## 2022-09-08 LAB — CUP PACEART INCLINIC DEVICE CHECK
Battery Remaining Longevity: 41 mo
Battery Voltage: 2.92 V
Brady Statistic AP VP Percent: 97.36 %
Brady Statistic AP VS Percent: 1.74 %
Brady Statistic AS VP Percent: 0.84 %
Brady Statistic AS VS Percent: 0.06 %
Brady Statistic RA Percent Paced: 99.11 %
Brady Statistic RV Percent Paced: 39.45 %
Date Time Interrogation Session: 20240123120057
Implantable Lead Connection Status: 753985
Implantable Lead Connection Status: 753985
Implantable Lead Connection Status: 753985
Implantable Lead Implant Date: 20171023
Implantable Lead Implant Date: 20171023
Implantable Lead Implant Date: 20171023
Implantable Lead Location: 753858
Implantable Lead Location: 753859
Implantable Lead Location: 753860
Implantable Lead Model: 4298
Implantable Lead Model: 5076
Implantable Lead Model: 5076
Implantable Pulse Generator Implant Date: 20171023
Lead Channel Impedance Value: 1007 Ohm
Lead Channel Impedance Value: 1064 Ohm
Lead Channel Impedance Value: 342 Ohm
Lead Channel Impedance Value: 380 Ohm
Lead Channel Impedance Value: 380 Ohm
Lead Channel Impedance Value: 399 Ohm
Lead Channel Impedance Value: 418 Ohm
Lead Channel Impedance Value: 532 Ohm
Lead Channel Impedance Value: 627 Ohm
Lead Channel Impedance Value: 646 Ohm
Lead Channel Impedance Value: 665 Ohm
Lead Channel Impedance Value: 684 Ohm
Lead Channel Impedance Value: 741 Ohm
Lead Channel Impedance Value: 779 Ohm
Lead Channel Pacing Threshold Amplitude: 0.375 V
Lead Channel Pacing Threshold Amplitude: 0.5 V
Lead Channel Pacing Threshold Amplitude: 1.25 V
Lead Channel Pacing Threshold Pulse Width: 0.4 ms
Lead Channel Pacing Threshold Pulse Width: 0.4 ms
Lead Channel Pacing Threshold Pulse Width: 0.4 ms
Lead Channel Sensing Intrinsic Amplitude: 1.25 mV
Lead Channel Sensing Intrinsic Amplitude: 1.375 mV
Lead Channel Sensing Intrinsic Amplitude: 14.5 mV
Lead Channel Sensing Intrinsic Amplitude: 14.5 mV
Lead Channel Setting Pacing Amplitude: 1.75 V
Lead Channel Setting Pacing Amplitude: 2 V
Lead Channel Setting Pacing Amplitude: 2.5 V
Lead Channel Setting Pacing Pulse Width: 0.4 ms
Lead Channel Setting Pacing Pulse Width: 0.4 ms
Lead Channel Setting Sensing Sensitivity: 2 mV
Zone Setting Status: 755011
Zone Setting Status: 755011

## 2022-09-08 NOTE — Patient Instructions (Signed)
Medication Instructions:   Your physician recommends that you continue on your current medications as directed. Please refer to the Current Medication list given to you today.   *If you need a refill on your cardiac medications before your next appointment, please call your pharmacy*   Lab Work:  None ordered.  If you have labs (blood work) drawn today and your tests are completely normal, you will receive your results only by: [place] (if you have MyChart) OR A paper copy in the mail If you have any lab test that is abnormal or we need to change your treatment, we will call you to review the results.   Testing/Procedures:  None ordered.   Follow-Up: At [facility], you and your health needs are our priority.  As part of our continuing mission to provide you with exceptional heart care, we have created designated Provider Care Teams.  These Care Teams include your primary Cardiologist (physician) and Advanced Practice Providers (APPs -  Physician Assistants and Nurse Practitioners) who all work together to provide you with the care you need, when you need it.  We recommend signing up for the patient portal called "MyChart".  Sign up information is provided on this After Visit Summary.  MyChart is used to connect with patients for Virtual Visits (Telemedicine).  Patients are able to view lab/test results, encounter notes, upcoming appointments, etc.  Non-urgent messages can be sent to your provider as well.   To learn more about what you can do with MyChart, go to [email or web address].    Your next appointment:   1 year(s)  Provider:   Allegra Lai, MD   Oda Kilts, PA Tommye Standard, [place] Other Instructions  Your physician wants you to follow-up in: 1 year.  You will receive a reminder letter in the mail two months in advance. If you don't receive a letter, please call our office to schedule the follow-up appointment.

## 2022-09-16 ENCOUNTER — Ambulatory Visit (INDEPENDENT_AMBULATORY_CARE_PROVIDER_SITE_OTHER): Payer: Self-pay | Admitting: Podiatry

## 2022-09-16 DIAGNOSIS — M7751 Other enthesopathy of right foot: Secondary | ICD-10-CM

## 2022-09-16 DIAGNOSIS — M76821 Posterior tibial tendinitis, right leg: Secondary | ICD-10-CM

## 2022-09-16 DIAGNOSIS — M76822 Posterior tibial tendinitis, left leg: Secondary | ICD-10-CM

## 2022-09-17 ENCOUNTER — Other Ambulatory Visit: Payer: Self-pay

## 2022-10-01 ENCOUNTER — Ambulatory Visit (INDEPENDENT_AMBULATORY_CARE_PROVIDER_SITE_OTHER): Payer: 59

## 2022-10-01 DIAGNOSIS — I495 Sick sinus syndrome: Secondary | ICD-10-CM

## 2022-10-02 LAB — CUP PACEART REMOTE DEVICE CHECK
Battery Remaining Longevity: 39 mo
Battery Voltage: 2.92 V
Brady Statistic AP VP Percent: 98.22 %
Brady Statistic AP VS Percent: 1.69 %
Brady Statistic AS VP Percent: 0.06 %
Brady Statistic AS VS Percent: 0.03 %
Brady Statistic RA Percent Paced: 99.92 %
Brady Statistic RV Percent Paced: 62.61 %
Date Time Interrogation Session: 20240214222901
Implantable Lead Connection Status: 753985
Implantable Lead Connection Status: 753985
Implantable Lead Connection Status: 753985
Implantable Lead Implant Date: 20171023
Implantable Lead Implant Date: 20171023
Implantable Lead Implant Date: 20171023
Implantable Lead Location: 753858
Implantable Lead Location: 753859
Implantable Lead Location: 753860
Implantable Lead Model: 4298
Implantable Lead Model: 5076
Implantable Lead Model: 5076
Implantable Pulse Generator Implant Date: 20171023
Lead Channel Impedance Value: 304 Ohm
Lead Channel Impedance Value: 342 Ohm
Lead Channel Impedance Value: 361 Ohm
Lead Channel Impedance Value: 380 Ohm
Lead Channel Impedance Value: 437 Ohm
Lead Channel Impedance Value: 513 Ohm
Lead Channel Impedance Value: 608 Ohm
Lead Channel Impedance Value: 608 Ohm
Lead Channel Impedance Value: 646 Ohm
Lead Channel Impedance Value: 684 Ohm
Lead Channel Impedance Value: 722 Ohm
Lead Channel Impedance Value: 779 Ohm
Lead Channel Impedance Value: 874 Ohm
Lead Channel Impedance Value: 931 Ohm
Lead Channel Pacing Threshold Amplitude: 0.375 V
Lead Channel Pacing Threshold Amplitude: 0.5 V
Lead Channel Pacing Threshold Amplitude: 1.25 V
Lead Channel Pacing Threshold Pulse Width: 0.4 ms
Lead Channel Pacing Threshold Pulse Width: 0.4 ms
Lead Channel Pacing Threshold Pulse Width: 0.4 ms
Lead Channel Sensing Intrinsic Amplitude: 1.75 mV
Lead Channel Sensing Intrinsic Amplitude: 1.75 mV
Lead Channel Sensing Intrinsic Amplitude: 10.875 mV
Lead Channel Sensing Intrinsic Amplitude: 10.875 mV
Lead Channel Setting Pacing Amplitude: 1.75 V
Lead Channel Setting Pacing Amplitude: 2 V
Lead Channel Setting Pacing Amplitude: 2.5 V
Lead Channel Setting Pacing Pulse Width: 0.4 ms
Lead Channel Setting Pacing Pulse Width: 0.4 ms
Lead Channel Setting Sensing Sensitivity: 2 mV
Zone Setting Status: 755011
Zone Setting Status: 755011

## 2022-10-29 NOTE — Progress Notes (Signed)
Remote pacemaker transmission.   

## 2022-10-30 ENCOUNTER — Encounter: Payer: 59 | Admitting: Family Medicine

## 2022-11-23 ENCOUNTER — Ambulatory Visit: Payer: 59 | Admitting: Podiatry

## 2022-12-31 ENCOUNTER — Ambulatory Visit (INDEPENDENT_AMBULATORY_CARE_PROVIDER_SITE_OTHER): Payer: 59

## 2022-12-31 DIAGNOSIS — I495 Sick sinus syndrome: Secondary | ICD-10-CM | POA: Diagnosis not present

## 2022-12-31 LAB — CUP PACEART REMOTE DEVICE CHECK
Battery Remaining Longevity: 37 mo
Battery Voltage: 2.92 V
Brady Statistic AP VP Percent: 98.22 %
Brady Statistic AP VS Percent: 1.69 %
Brady Statistic AS VP Percent: 0.05 %
Brady Statistic AS VS Percent: 0.04 %
Brady Statistic RA Percent Paced: 99.92 %
Brady Statistic RV Percent Paced: 56.23 %
Date Time Interrogation Session: 20240516003339
Implantable Lead Connection Status: 753985
Implantable Lead Connection Status: 753985
Implantable Lead Connection Status: 753985
Implantable Lead Implant Date: 20171023
Implantable Lead Implant Date: 20171023
Implantable Lead Implant Date: 20171023
Implantable Lead Location: 753858
Implantable Lead Location: 753859
Implantable Lead Location: 753860
Implantable Lead Model: 4298
Implantable Lead Model: 5076
Implantable Lead Model: 5076
Implantable Pulse Generator Implant Date: 20171023
Lead Channel Impedance Value: 304 Ohm
Lead Channel Impedance Value: 342 Ohm
Lead Channel Impedance Value: 361 Ohm
Lead Channel Impedance Value: 399 Ohm
Lead Channel Impedance Value: 437 Ohm
Lead Channel Impedance Value: 513 Ohm
Lead Channel Impedance Value: 608 Ohm
Lead Channel Impedance Value: 627 Ohm
Lead Channel Impedance Value: 646 Ohm
Lead Channel Impedance Value: 684 Ohm
Lead Channel Impedance Value: 741 Ohm
Lead Channel Impedance Value: 779 Ohm
Lead Channel Impedance Value: 817 Ohm
Lead Channel Impedance Value: 874 Ohm
Lead Channel Pacing Threshold Amplitude: 0.5 V
Lead Channel Pacing Threshold Amplitude: 0.5 V
Lead Channel Pacing Threshold Amplitude: 1.25 V
Lead Channel Pacing Threshold Pulse Width: 0.4 ms
Lead Channel Pacing Threshold Pulse Width: 0.4 ms
Lead Channel Pacing Threshold Pulse Width: 0.4 ms
Lead Channel Sensing Intrinsic Amplitude: 1.625 mV
Lead Channel Sensing Intrinsic Amplitude: 1.625 mV
Lead Channel Sensing Intrinsic Amplitude: 13.375 mV
Lead Channel Sensing Intrinsic Amplitude: 13.375 mV
Lead Channel Setting Pacing Amplitude: 1.75 V
Lead Channel Setting Pacing Amplitude: 2 V
Lead Channel Setting Pacing Amplitude: 2.5 V
Lead Channel Setting Pacing Pulse Width: 0.4 ms
Lead Channel Setting Pacing Pulse Width: 0.4 ms
Lead Channel Setting Sensing Sensitivity: 2 mV
Zone Setting Status: 755011
Zone Setting Status: 755011

## 2023-01-14 NOTE — Progress Notes (Signed)
Remote pacemaker transmission.   

## 2023-01-29 ENCOUNTER — Ambulatory Visit (INDEPENDENT_AMBULATORY_CARE_PROVIDER_SITE_OTHER): Payer: 59 | Admitting: Family Medicine

## 2023-01-29 ENCOUNTER — Encounter: Payer: Self-pay | Admitting: Family Medicine

## 2023-01-29 VITALS — BP 126/80 | HR 71 | Temp 97.7°F | Ht 71.0 in | Wt 260.4 lb

## 2023-01-29 DIAGNOSIS — I4819 Other persistent atrial fibrillation: Secondary | ICD-10-CM

## 2023-01-29 DIAGNOSIS — Z1283 Encounter for screening for malignant neoplasm of skin: Secondary | ICD-10-CM

## 2023-01-29 DIAGNOSIS — Z0000 Encounter for general adult medical examination without abnormal findings: Secondary | ICD-10-CM | POA: Diagnosis not present

## 2023-01-29 DIAGNOSIS — E559 Vitamin D deficiency, unspecified: Secondary | ICD-10-CM

## 2023-01-29 DIAGNOSIS — I428 Other cardiomyopathies: Secondary | ICD-10-CM | POA: Diagnosis not present

## 2023-01-29 DIAGNOSIS — E785 Hyperlipidemia, unspecified: Secondary | ICD-10-CM

## 2023-01-29 DIAGNOSIS — R739 Hyperglycemia, unspecified: Secondary | ICD-10-CM

## 2023-01-29 DIAGNOSIS — K760 Fatty (change of) liver, not elsewhere classified: Secondary | ICD-10-CM

## 2023-01-29 DIAGNOSIS — Z131 Encounter for screening for diabetes mellitus: Secondary | ICD-10-CM

## 2023-01-29 NOTE — Patient Instructions (Addendum)
[facility] Uncertain Schedule an appointment by calling [phone].  We have placed a referral for you today to [facility]. You should receive a mychart message or phone call within a week with the # to call. Reach out to [place] if you are not scheduled within 2 weeks  # elevated blood pressure concern  - multiple home readings at home below 140- occasional gets up to 140- she would like to focus on diet/exercise and continued weight loss- asked her to give me 10 days of readings from 10 days in a row or so in about a month to check in - I will average them out and if above 135/85 may need to look at medicine -could also try to reduce tylenol   Schedule a lab visit at the check out desk within 2 weeks. Return for future fasting labs meaning nothing but water after midnight please. Ok to take your medications with water.    DASH Eating Plan DASH stands for Dietary Approaches to Stop Hypertension. The DASH eating plan is a healthy eating plan that has been shown to: Lower high blood pressure (hypertension). Reduce your risk for type 2 diabetes, heart disease, and stroke. Help with weight loss. What are tips for following this plan? Reading food labels Check food labels for the amount of salt (sodium) per serving. Choose foods with less than 5 percent of the Daily Value (DV) of sodium. In general, foods with less than 300 milligrams (mg) of sodium per serving fit into this eating plan. To find whole grains, look for the word "whole" as the first word in the ingredient list. Shopping Buy products labeled as "low-sodium" or "no salt added." Buy fresh foods. Avoid canned foods and pre-made or frozen meals. Cooking Try not to add salt when you cook. Use salt-free seasonings or herbs instead of table salt or sea salt. Check with your health care provider or pharmacist before using salt substitutes. Do not fry foods. Cook foods in healthy ways, such as baking, boiling, grilling,  roasting, or broiling. Cook using oils that are good for your heart. These include olive, canola, avocado, soybean, and sunflower oil. Meal planning  Eat a balanced diet. This should include: 4 or more servings of fruits and 4 or more servings of vegetables each day. Try to fill half of your plate with fruits and vegetables. 6-8 servings of whole grains each day. 6 or less servings of lean meat, poultry, or fish each day. 1 oz is 1 serving. A 3 oz (85 g) serving of meat is about the same size as the palm of your hand. One egg is 1 oz (28 g). 2-3 servings of low-fat dairy each day. One serving is 1 cup (237 mL). 1 serving of nuts, seeds, or beans 5 times each week. 2-3 servings of heart-healthy fats. Healthy fats called omega-3 fatty acids are found in foods such as walnuts, flaxseeds, fortified milks, and eggs. These fats are also found in cold-water fish, such as sardines, salmon, and mackerel. Limit how much you eat of: Canned or prepackaged foods. Food that is high in trans fat, such as fried foods. Food that is high in saturated fat, such as fatty meat. Desserts and other sweets, sugary drinks, and other foods with added sugar. Full-fat dairy products. Do not salt foods before eating. Do not eat more than 4 egg yolks a week. Try to eat at least 2 vegetarian meals a week. Eat more home-cooked food and less restaurant, buffet, and  fast food. Lifestyle When eating at a restaurant, ask if your food can be made with less salt or no salt. If you drink alcohol: Limit how much you have to: 0-1 drink a day if you are female. 0-2 drinks a day if you are female. Know how much alcohol is in your drink. In the [place] one drink is one 12 oz bottle of beer (355 mL), one 5 oz glass of wine (148 mL), or one 1 oz glass of hard liquor (44 mL). General information Avoid eating more than 2,300 mg of salt a day. If you have hypertension, you may need to reduce your sodium intake to 1,500 mg a day. Work  with your provider to stay at a healthy body weight or lose weight. Ask what the best weight range is for you. On most days of the week, get at least 30 minutes of exercise that causes your heart to beat faster. This may include walking, swimming, or biking. Work with your provider or dietitian to adjust your eating plan to meet your specific calorie needs. What foods should I eat? Fruits All fresh, dried, or frozen fruit. Canned fruits that are in their natural juice and do not have sugar added to them. Vegetables Fresh or frozen vegetables that are raw, steamed, roasted, or grilled. Low-sodium or reduced-sodium tomato and vegetable juice. Low-sodium or reduced-sodium tomato sauce and tomato paste. Low-sodium or reduced-sodium canned vegetables. Grains Whole-grain or whole-wheat bread. Whole-grain or whole-wheat pasta. Brown rice. Orpah Cobb. Bulgur. Whole-grain and low-sodium cereals. Pita bread. Low-fat, low-sodium crackers. Whole-wheat flour tortillas. Meats and other proteins Skinless chicken or [place]. Ground chicken or [place]. Pork with fat trimmed off. Fish and seafood. Egg whites. Dried beans, peas, or lentils. Unsalted nuts, nut butters, and seeds. Unsalted canned beans. Lean cuts of beef with fat trimmed off. Low-sodium, lean precooked or cured meat, such as sausages or meat loaves. Dairy Low-fat (1%) or fat-free (skim) milk. Reduced-fat, low-fat, or fat-free cheeses. Nonfat, low-sodium ricotta or cottage cheese. Low-fat or nonfat yogurt. Low-fat, low-sodium cheese. Fats and oils Soft margarine without trans fats. Vegetable oil. Reduced-fat, low-fat, or light mayonnaise and salad dressings (reduced-sodium). Canola, safflower, olive, avocado, soybean, and sunflower oils. Avocado. Seasonings and condiments Herbs. Spices. Seasoning mixes without salt. Other foods Unsalted popcorn and pretzels. Fat-free sweets. The items listed above may not be all the foods and drinks you can have.  Talk to a dietitian to learn more. What foods should I avoid? Fruits Canned fruit in a light or heavy syrup. Fried fruit. Fruit in cream or butter sauce. Vegetables Creamed or fried vegetables. Vegetables in a cheese sauce. Regular canned vegetables that are not marked as low-sodium or reduced-sodium. Regular canned tomato sauce and paste that are not marked as low-sodium or reduced-sodium. Regular tomato and vegetable juices that are not marked as low-sodium or reduced-sodium. Rosita Fire. Olives. Grains Baked goods made with fat, such as croissants, muffins, or some breads. Dry pasta or rice meal packs. Meats and other proteins Fatty cuts of meat. Ribs. Fried meat. Tomasa Blase. Bologna, salami, and other precooked or cured meats, such as sausages or meat loaves, that are not lean and low in sodium. Fat from the back of a pig (fatback). Bratwurst. Salted nuts and seeds. Canned beans with added salt. Canned or smoked fish. Whole eggs or egg yolks. Chicken or [place] with skin. Dairy Whole or 2% milk, cream, and half-and-half. Whole or full-fat cream cheese. Whole-fat or sweetened yogurt. Full-fat cheese. Nondairy creamers. Whipped toppings. Processed  cheese and cheese spreads. Fats and oils Butter. Stick margarine. Lard. Shortening. Ghee. Bacon fat. Tropical oils, such as coconut, palm kernel, or palm oil. Seasonings and condiments Onion salt, garlic salt, seasoned salt, table salt, and sea salt. Worcestershire sauce. Tartar sauce. Barbecue sauce. Teriyaki sauce. Soy sauce, including reduced-sodium soy sauce. Steak sauce. Canned and packaged gravies. Fish sauce. Oyster sauce. Cocktail sauce. Store-bought horseradish. Ketchup. Mustard. Meat flavorings and tenderizers. Bouillon cubes. Hot sauces. Pre-made or packaged marinades. Pre-made or packaged taco seasonings. Relishes. Regular salad dressings. Other foods Salted popcorn and pretzels. The items listed above may not be all the foods and drinks you should  avoid. Talk to a dietitian to learn more. Where to find more information National Heart, Lung, and Blood Institute (NHLBI): BuffaloDryCleaner.gl American Heart Association (AHA): heart.org Academy of Nutrition and Dietetics: eatright.org National Kidney Foundation (NKF): kidney.org This information is not intended to replace advice given to you by your health care provider. Make sure you discuss any questions you have with your health care provider. Document Revised: 08/20/2022 Document Reviewed: 08/20/2022 Elsevier Patient Education  2024 Elsevier Inc.   Recommended follow up: Return in about 6 months (around 07/31/2023) for followup or sooner if needed.Schedule b4 you leave.

## 2023-01-29 NOTE — Progress Notes (Signed)
Phone [phone]   Subjective:  Patient presents today for their annual physical. Chief complaint-noted.   See problem oriented charting- ROS- full  review of systems was completed and negative Per full ROS sheet completed by patient other than frustration with losing weight, foot pain with walking so avoids walking  The following were reviewed and entered/updated in epic: Past Medical History:  Diagnosis Date   Allergic rhinitis    Anxiety    when tachycardia occurs   Arthritis    OA, s/p numerous surgeries -back, knees, hip   Benign fundic gland polyps of stomach    Cervical disc disease    Diverticulitis    CT Scan    DVT (deep venous thrombosis) (HCC)    in pregnancy, s/p hip surgery   Fatty liver    GERD 12/23/2009   diet related-not a problem   Hemorrhoids    Hiatal hernia    small   History of loop recorder    placed then removed   History of MRSA infection 2008   superficial skin-cleared easily with doxycycline   Hx of adenomatous polyp of colon 12/21/2021   Obesity    Persistent atrial fibrillation (HCC) 03/05/2007   a. s/p RFCA 12/18/2011, 07/29/12   Post-operative nausea and vomiting    Presence of permanent cardiac pacemaker 06/08/2016   s/p atrial septal defect repair 05/28/2016   S/P Maze operation for atrial fibrillation 05/28/2016   Complete bilateral atrial lesion set using cryothermy and bipolar radiofrequency ablation with clipping of LA appendage via median sternotomy   Sialoadenitis    Tachy-brady syndrome [facility])    Patient Active Problem List   Diagnosis Date Noted   Pacemaker 12/05/2021    Priority: High   S/P Maze operation for atrial fibrillation + closure ASD 05/28/2016    Priority: High   Nonischemic cardiomyopathy (HCC)     Priority: High   SVT (supraventricular tachycardia) 02/11/2011    Priority: High   Atrial fibrillation (HCC) 03/05/2007    Priority: High   Hx of adenomatous polyp of colon 12/21/2021    Priority: Medium     Fatty liver 12/05/2021    Priority: Medium    Hyperlipidemia, unspecified 12/05/2021    Priority: Medium    Vitamin D deficiency 04/27/2017    Priority: Medium    History of DVT (deep vein thrombosis) 10/24/2013    Priority: Medium    Atrial septal defect 01/01/2016    Priority: Low   S/P total right hip arthroplasty 08/11/2022    Priority: 1.   Tibialis posterior tendon tear, nontraumatic, left 04/29/2017    Priority: 1.   Flat foot 04/29/2017    Priority: 1.   Leg length discrepancy 04/29/2017    Priority: 1.   Arthritis, senescent 06/28/2015    Priority: 1.   Sinus bradycardia 09/07/2022   Past Surgical History:  Procedure Laterality Date   ASD REPAIR N/A 05/28/2016   Procedure: ATRIAL SEPTAL DEFECT (ASD) closure;  Surgeon: Purcell Nails, MD;  Location: [facility] OR;  Service: Open Heart Surgery;  Laterality: N/A;   ATRIAL FIBRILLATION ABLATION N/A 12/18/2011   Procedure: ATRIAL FIBRILLATION ABLATION;  Surgeon: Hillis Range, MD;  Location: [facility] CATH LAB;  Service: Cardiovascular;  Laterality: N/A;   ATRIAL FIBRILLATION ABLATION N/A 07/29/2012   Procedure: ATRIAL FIBRILLATION ABLATION;  Surgeon: Hillis Range, MD;  Location: [facility] CATH LAB;  Service: Cardiovascular;  Laterality: N/A;   CARDIAC CATHETERIZATION N/A 04/10/2016   Procedure: Right/Left Heart Cath and Coronary Angiography;  Surgeon:  Corky Crafts, MD;  Location: [facility] INVASIVE CV LAB;  Service: Cardiovascular;  Laterality: N/A;   CERVICAL FUSION  2010   CESAREAN SECTION     x 2   CHOLECYSTECTOMY  2006   CLIPPING OF ATRIAL APPENDAGE  05/28/2016   Procedure: CLIPPING OF ATRIAL APPENDAGE;  Surgeon: Purcell Nails, MD;  Location: [facility] OR;  Service: Open Heart Surgery;;   COLONOSCOPY  07/16/2011   diverticulosis   ELBOW SURGERY     Right   EP IMPLANTABLE DEVICE N/A 09/10/2015   Procedure: Loop Recorder Insertion;  Surgeon: Hillis Range, MD;  Location: [facility] INVASIVE CV LAB;  Service: Cardiovascular;  Laterality: N/A;   EP  IMPLANTABLE DEVICE N/A 06/08/2016   MDT Paulene Floor CRT-P implanted by Dr Elberta Fortis   FLEXIBLE SIGMOIDOSCOPY     [phone]   FOOT SURGERY     Right    I & D KNEE WITH POLY EXCHANGE Left 12/12/2012   Procedure: LEFT KNEE EXCISION SAPHENOUS NEUROMA/OPEN SCAR DEBRIDEMENT/POLY EXCHANGE/NERVE EXCISION;  Surgeon: Shelda Pal, MD;  Location: [facility] ORS;  Service: Orthopedics;  Laterality: Left;   JOINT REPLACEMENT Bilateral    4'14  knees   KNEE SURGERY     x 9 Left Knee   MAZE  05/28/2016   Procedure: MAZE;  Surgeon: Purcell Nails, MD;  Location: [facility] OR;  Service: Open Heart Surgery;;   MEDIASTERNOTOMY N/A 05/28/2016   Procedure: MEDIAN STERNOTOMY;  Surgeon: Purcell Nails, MD;  Location: [facility] OR;  Service: Open Heart Surgery;  Laterality: N/A;   RADIOACTIVE SEED GUIDED EXCISIONAL BREAST BIOPSY Right 03/26/2022   Procedure: RIGHT BREAST SEED GUIDED EXCISIONAL BIOPSY;  Surgeon: Emelia Loron, MD;  Location: [facility];  Service: General;  Laterality: Right;  LMA   REDUCTION MAMMAPLASTY Bilateral    SHOULDER SURGERY     Right    TEE WITHOUT CARDIOVERSION  12/17/2011   Procedure: TRANSESOPHAGEAL ECHOCARDIOGRAM (TEE);  Surgeon: Laurey Morale, MD;  Location: [facility] ENDOSCOPY;  Service: Cardiovascular;  Laterality: N/A;   TEE WITHOUT CARDIOVERSION  07/28/2012   Procedure: TRANSESOPHAGEAL ECHOCARDIOGRAM (TEE);  Surgeon: Vesta Mixer, MD;  Location: [facility] ENDOSCOPY;  Service: Cardiovascular;  Laterality: N/A;   TEE WITHOUT CARDIOVERSION N/A 01/01/2016   Procedure: TRANSESOPHAGEAL ECHOCARDIOGRAM (TEE);  Surgeon: Thurmon Fair, MD;  Location: [facility] ENDOSCOPY;  Service: Cardiovascular;  Laterality: N/A;   TEE WITHOUT CARDIOVERSION N/A 05/28/2016   Procedure: TRANSESOPHAGEAL ECHOCARDIOGRAM (TEE);  Surgeon: Purcell Nails, MD;  Location: [facility] OR;  Service: Open Heart Surgery;  Laterality: N/A;   TOTAL HIP ARTHROPLASTY Left 10/17/2013   Procedure: LEFT TOTAL HIP ARTHROPLASTY ANTERIOR APPROACH;   Surgeon: Shelda Pal, MD;  Location: [facility] ORS;  Service: Orthopedics;  Laterality: Left;   TOTAL HIP ARTHROPLASTY Right 08/11/2022   Procedure: TOTAL HIP ARTHROPLASTY ANTERIOR APPROACH;  Surgeon: Durene Romans, MD;  Location: [facility] ORS;  Service: Orthopedics;  Laterality: Right;   TOTAL KNEE ARTHROPLASTY Right 12/12/2012   Procedure: RIGHT TOTAL KNEE ARTHROPLASTY;  Surgeon: Shelda Pal, MD;  Location: [facility] ORS;  Service: Orthopedics;  Laterality: Right;   TUBAL LIGATION  2005   UPPER GASTROINTESTINAL ENDOSCOPY  01/30/2010   hiatal hernia, fundic gland polyps   WRIST SURGERY     Left     Family History  Problem Relation Age of Onset   Diabetes Mother    Hyperlipidemia Mother    Other Mother        covid complications- 72   Liver disease Father  Obesity Father    Other Father        covid complications- 80   Diabetes Maternal Grandfather    Cervical cancer Maternal Grandfather    Breast cancer Paternal Grandmother    Diverticulitis Paternal Grandmother    Diverticulitis Brother        x 2   Atrial fibrillation Brother    Arthritis Brother        2 knee replacements   Healthy Brother        doesnt see doctor much   Colon cancer Neg Hx    Esophageal cancer Neg Hx    Rectal cancer Neg Hx    Stomach cancer Neg Hx    Colon polyps Neg Hx     Medications- reviewed and updated Current Outpatient Medications  Medication Sig Dispense Refill   acetaminophen (TYLENOL) 500 MG tablet Take 1,000 mg by mouth daily in the afternoon.     Barberry-Oreg Grape-Goldenseal (BERBERINE COMPLEX PO) Take 1 tablet by mouth 2 (two) times daily.     ibuprofen (ADVIL) 200 MG tablet Take 800 mg by mouth every evening.     MAGNESIUM PO Take 2 tablets by mouth at bedtime.     metoprolol succinate (TOPROL-XL) 100 MG 24 hr tablet TAKE 1 TABLET BY MOUTH EVERY DAY 30 tablet 11   Multiple Vitamin (MULTIVITAMIN) capsule Take 1 capsule by mouth daily.     Omega-3 Fatty Acids (OMEGA 3 PO) Take 2 capsules by  mouth daily.     OVER THE COUNTER MEDICATION Take 2 capsules by mouth daily in the afternoon. ADIPROMIN weight loss supplement     progesterone (PROMETRIUM) 100 MG capsule Take 100 mg by mouth at bedtime.     TURMERIC PO Take 3 tablets by mouth daily.     No current facility-administered medications for this visit.    Allergies-reviewed and updated Allergies  Allergen Reactions   Avelox [Moxifloxacin Hcl In Nacl] Other (See Comments)    Numbness & tingling Peripheral neuropathy    Social History   Social History Narrative   Updated 11/2021   Pt lives in [place] with spouse and two children 18 and 23 in 2023- still at home      She works as a workers Runner, broadcasting/film/video.   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    Objective  Objective:  BP 126/80 Comment: most recent home reading  Pulse 71   Temp 97.7 F (36.5 C)   Ht 5\' 11"  (1.803 m)   Wt 260 lb 6.4 oz (118.1 kg)   LMP 04/17/2012   SpO2 99%   BMI 36.32 kg/m  Gen: NAD, resting comfortably HEENT: Mucous membranes are moist. Oropharynx normal Neck: no thyromegaly CV: RRR no murmurs rubs or gallops Lungs: CTAB no crackles, wheeze, rhonchi Abdomen: soft/nontender/nondistended/normal bowel sounds. No rebound or guarding.  Ext: no edema Skin: warm, dry Neuro: grossly normal, moves all extremities, PERRLA   Assessment and Plan   62 y.o. female presenting for annual physical.  Health Maintenance counseling: 1. Anticipatory guidance: Patient counseled regarding regular dental exams -q6 months, eye exams - yearly,  avoiding smoking and second hand smoke , limiting alcohol to 1 beverage per day- well under , no illicit drugs .   2. Risk factor reduction:  Advised patient of need for regular exercise and diet rich and fruits and vegetables to reduce risk of heart attack and stroke.  Exercise- lifting weights  3 days a week, running in pool, playing pickle ball 3 days a week.   Diet/weight management-down 3 lbs from last visit- worried hormones are affecting this- went back to [employer] as likes their meals and shakes. Doing a light lunch- egg bites, breakfast shake, feels nighttime when she gets home is when she is vulnerable (but has cut chips out). Working on mindful eating. Trying to do more grilled chicken/salmon Wt Readings from Last 3 Encounters:  01/29/23 260 lb 6.4 oz (118.1 kg)  09/08/22 258 lb 3.2 oz (117.1 kg)  08/11/22 263 lb 8 oz (119.5 kg)  3. Immunizations/screenings/ancillary studies-opts out of Shingrix and COVID shot  Immunization History  Administered Date(s) Administered   Influenza Inj Mdck Quad Pf 05/10/2018   Influenza Split 07/30/2012   Influenza Whole 06/27/2009, 05/02/2010   Influenza,inj,Quad PF,6+ Mos 05/09/2014, 06/24/2016, 07/15/2017   Influenza-Unspecified 04/19/2015, 05/10/2018   Tdap 06/13/2014   4. Cervical cancer screening- 02/23/22 with ccob include negative human papilloma virus- good for 5 years 5. Breast cancer screening-  breast exam with GYn and mammogram - 12/20/21 and ended up with biopsy and initially atypical lobular hyperplasia and later removal but final biopsy was normal breast tissue- she plans to call to schedule 6. Colon cancer screening - 12/12/2021 with 10-year repeat- did have small diminutive adenomatous polyp but still 10 years per DrMarland Kitchen Leone Payor 7. Skin cancer screening-no dermatologist recently-prior Dr. Jorja Loa- refer to [facility]. advised regular sunscreen use. Denies worrisome, changing, or new skin lesions.  8. Birth control/STD check- postmenopausal monogamous 9. Osteoporosis screening at 23- has never had at gynecology- ok with doing at 67 10. Smoking associated screening -never smoker  Status of chronic or acute concerns   #Atrial fibrillation with history of MAZE operation with LAA clip by Dr. Cornelius Moras #SVT S: Rate controlled with metoprolol 100 mg extended release- this also addresses SVT- per  patient still gets brief periods of this  no NOAC at this time also due to LAA clip Patient is  followed by cardiology: Dr. Elberta Fortis A/P: a fib has done well since maze and does not need NOAC as has LAA clip  -svt would be controlled if went into this with the metoprolol- has had very brief episodes and gets pacemaker reads and have bene ok   #Nonischemic cardiomyopathy S: Ejection fraction has normalized with continuous rate control/pacemaker that occurred after LAA clip which led to severe heart block and later required pacemaker age 17 A/P: has done well after pacemaker- continue to monitor    #Vitamin D deficiency S: Medication: none at all right now- getting out in sun -5000 IU daily in past but levels got high A/P: hopefully stable- update vitamin D today. Continue without meds for now    #high risk med use- Celebrex through orthopedic. Also extra strength tylenol at night  #hyperlipidemia S: Medication:none Lab Results  Component Value Date   CHOL 211 (H) 01/28/2022   HDL 56.30 01/28/2022   LDLCALC 126 (H) 01/28/2022   LDLDIRECT 139.5 11/15/2006   TRIG 143.0 01/28/2022   CHOLHDL 4 01/28/2022  The 10-year ASCVD risk score (Arnett DK, et al., 2019) is: 4%  A/P: with risk still under 7.5% hold off medication(s) or evaluation unless medications go up . Does try to limit red meat  # Right hip replacement-08/11/2021 with Dr. Charlann Boxer- has done well  # elevated blood pressure concern  - multiple home readings at home below 140- occasional gets up to 140- she would like to focus on diet/exercise  and continued weight loss- asked her to give me 10 days of readings from 10 days in a row or so in about a month to check in - I will average them out and if above 135/85 may need to look at medicine -tylenol and Celebrex could contribute as well BP Readings from Last 3 Encounters:  01/29/23 126/80  09/08/22 (!) 142/80  08/11/22 (!) 140/70   Recommended follow up: Return in about 6 months  (around 07/31/2023) for followup or sooner if needed.Schedule b4 you leave. Future Appointments  Date Time Provider Department Center  04/01/2023  7:00 AM [facility] DEVICE REMOTES [facility]  04/01/2023  7:20 AM [facility] DEVICE REMOTES [facility]  07/01/2023  7:20 AM [facility] DEVICE REMOTES [facility]  09/30/2023  7:20 AM [facility] DEVICE REMOTES [facility]  12/30/2023  7:20 AM [facility] DEVICE REMOTES [facility]  03/30/2024  7:20 AM [facility] DEVICE REMOTES [facility]   Lab/Order associations:NOT fasting   ICD-10-CM   1. Routine general medical examination at a health care facility  Z00.00     2. Persistent atrial fibrillation (HCC)  I48.19     3. Nonischemic cardiomyopathy (HCC)  I42.8     4. Fatty liver  K76.0     5. Hyperlipidemia, unspecified hyperlipidemia type  E78.5 Comprehensive metabolic panel    CBC with Differential/Platelet    Lipid panel    6. Vitamin D deficiency  E55.9 VITAMIN D 25 Hydroxy (Vit-D Deficiency, Fractures)    7. Screening exam for skin cancer  Z12.83 Ambulatory referral to Dermatology      No orders of the defined types were placed in this encounter.   Return precautions advised.  Tana Conch, MD

## 2023-02-01 ENCOUNTER — Other Ambulatory Visit: Payer: Self-pay | Admitting: Family Medicine

## 2023-02-01 DIAGNOSIS — Z0000 Encounter for general adult medical examination without abnormal findings: Secondary | ICD-10-CM

## 2023-02-02 ENCOUNTER — Other Ambulatory Visit (HOSPITAL_BASED_OUTPATIENT_CLINIC_OR_DEPARTMENT_OTHER): Payer: Self-pay

## 2023-02-02 MED ORDER — PROGESTERONE MICRONIZED 100 MG PO CAPS
100.0000 mg | ORAL_CAPSULE | Freq: Every day | ORAL | 5 refills | Status: DC
  Filled 2023-02-02 – 2023-07-05 (×2): qty 30, 30d supply, fill #0
  Filled 2023-09-03: qty 30, 30d supply, fill #1
  Filled 2023-10-07: qty 30, 30d supply, fill #2
  Filled 2023-10-29 – 2023-10-30 (×2): qty 30, 30d supply, fill #3

## 2023-02-05 ENCOUNTER — Other Ambulatory Visit: Payer: 59

## 2023-02-17 ENCOUNTER — Ambulatory Visit: Payer: 59

## 2023-03-08 ENCOUNTER — Ambulatory Visit
Admission: RE | Admit: 2023-03-08 | Discharge: 2023-03-08 | Disposition: A | Discharge status: Home / Self Care | Payer: 59 | Source: Ambulatory Visit | Attending: Family Medicine | Admitting: Family Medicine

## 2023-03-08 DIAGNOSIS — Z0000 Encounter for general adult medical examination without abnormal findings: Secondary | ICD-10-CM

## 2023-04-01 ENCOUNTER — Ambulatory Visit (INDEPENDENT_AMBULATORY_CARE_PROVIDER_SITE_OTHER): Payer: 59

## 2023-04-01 ENCOUNTER — Ambulatory Visit: Payer: 59

## 2023-04-01 DIAGNOSIS — I495 Sick sinus syndrome: Secondary | ICD-10-CM

## 2023-04-01 LAB — CUP PACEART REMOTE DEVICE CHECK
Battery Remaining Longevity: 33 mo
Battery Voltage: 2.91 V
Brady Statistic AP VP Percent: 98.26 %
Brady Statistic AP VS Percent: 1.66 %
Brady Statistic AS VP Percent: 0.05 %
Brady Statistic AS VS Percent: 0.03 %
Brady Statistic RA Percent Paced: 99.93 %
Brady Statistic RV Percent Paced: 77.06 %
Date Time Interrogation Session: 20240815031751
Implantable Lead Connection Status: 753985
Implantable Lead Connection Status: 753985
Implantable Lead Connection Status: 753985
Implantable Lead Implant Date: 20171023
Implantable Lead Implant Date: 20171023
Implantable Lead Implant Date: 20171023
Implantable Lead Location: 753858
Implantable Lead Location: 753859
Implantable Lead Location: 753860
Implantable Lead Model: 4298
Implantable Lead Model: 5076
Implantable Lead Model: 5076
Implantable Pulse Generator Implant Date: 20171023
Lead Channel Impedance Value: 304 Ohm
Lead Channel Impedance Value: 342 Ohm
Lead Channel Impedance Value: 342 Ohm
Lead Channel Impedance Value: 399 Ohm
Lead Channel Impedance Value: 418 Ohm
Lead Channel Impedance Value: 494 Ohm
Lead Channel Impedance Value: 570 Ohm
Lead Channel Impedance Value: 608 Ohm
Lead Channel Impedance Value: 627 Ohm
Lead Channel Impedance Value: 684 Ohm
Lead Channel Impedance Value: 684 Ohm
Lead Channel Impedance Value: 760 Ohm
Lead Channel Impedance Value: 874 Ohm
Lead Channel Impedance Value: 912 Ohm
Lead Channel Pacing Threshold Amplitude: 0.5 V
Lead Channel Pacing Threshold Amplitude: 0.5 V
Lead Channel Pacing Threshold Amplitude: 1.25 V
Lead Channel Pacing Threshold Pulse Width: 0.4 ms
Lead Channel Pacing Threshold Pulse Width: 0.4 ms
Lead Channel Pacing Threshold Pulse Width: 0.4 ms
Lead Channel Sensing Intrinsic Amplitude: 1.625 mV
Lead Channel Sensing Intrinsic Amplitude: 1.625 mV
Lead Channel Sensing Intrinsic Amplitude: 9.125 mV
Lead Channel Sensing Intrinsic Amplitude: 9.125 mV
Lead Channel Setting Pacing Amplitude: 1.75 V
Lead Channel Setting Pacing Amplitude: 2 V
Lead Channel Setting Pacing Amplitude: 2.5 V
Lead Channel Setting Pacing Pulse Width: 0.4 ms
Lead Channel Setting Pacing Pulse Width: 0.4 ms
Lead Channel Setting Sensing Sensitivity: 2 mV
Zone Setting Status: 755011
Zone Setting Status: 755011

## 2023-04-14 NOTE — Progress Notes (Signed)
Remote pacemaker transmission.   

## 2023-04-16 ENCOUNTER — Ambulatory Visit: Payer: 59 | Admitting: Podiatry

## 2023-04-20 ENCOUNTER — Other Ambulatory Visit: Payer: 59

## 2023-05-15 NOTE — Progress Notes (Signed)
Seen by casting department

## 2023-06-09 ENCOUNTER — Ambulatory Visit (INDEPENDENT_AMBULATORY_CARE_PROVIDER_SITE_OTHER): Payer: 59 | Admitting: Podiatry

## 2023-06-09 ENCOUNTER — Encounter: Payer: Self-pay | Admitting: Podiatry

## 2023-06-09 ENCOUNTER — Ambulatory Visit (INDEPENDENT_AMBULATORY_CARE_PROVIDER_SITE_OTHER): Payer: 59

## 2023-06-09 VITALS — Ht 72.0 in | Wt 260.0 lb

## 2023-06-09 DIAGNOSIS — M722 Plantar fascial fibromatosis: Secondary | ICD-10-CM | POA: Diagnosis not present

## 2023-06-09 DIAGNOSIS — M778 Other enthesopathies, not elsewhere classified: Secondary | ICD-10-CM

## 2023-06-09 MED ORDER — TRIAMCINOLONE ACETONIDE 10 MG/ML IJ SUSP
10.0000 mg | Freq: Once | INTRAMUSCULAR | Status: AC
Start: 2023-06-09 — End: 2023-06-09
  Administered 2023-06-09: 10 mg via INTRA_ARTICULAR

## 2023-06-09 NOTE — Patient Instructions (Signed)

## 2023-06-09 NOTE — Progress Notes (Signed)
Subjective:   Patient ID: Destiny Dennis, female   DOB: 62 y.o.   MRN: [id number]   HPI Patient states she has developed intense discomfort plantar aspect right heel and has very flat foot deformity has not been able to wear orthotics comfortably   ROS      Objective:  Physical Exam  Neuro vascular status intact exquisite discomfort noted medial fascial band right at the insertional point tendon calcaneus with fluid buildup with severe flatfoot deformity     Assessment:  Acute plantar fasciitis with inflammation right with flatfoot deformity     Plan:  H&P reviewed at this point went ahead did sterile prep injected the plantar fascia right 3 mg Kenalog 5 mg Xylocaine applied fascial brace to lift up the arch with instructions on usage reappoint to recheck  X-rays indicate significant depression of arch right

## 2023-06-14 ENCOUNTER — Other Ambulatory Visit (INDEPENDENT_AMBULATORY_CARE_PROVIDER_SITE_OTHER): Payer: 59

## 2023-06-14 DIAGNOSIS — R739 Hyperglycemia, unspecified: Secondary | ICD-10-CM

## 2023-06-14 DIAGNOSIS — E785 Hyperlipidemia, unspecified: Secondary | ICD-10-CM | POA: Diagnosis not present

## 2023-06-14 DIAGNOSIS — Z131 Encounter for screening for diabetes mellitus: Secondary | ICD-10-CM

## 2023-06-14 DIAGNOSIS — E559 Vitamin D deficiency, unspecified: Secondary | ICD-10-CM | POA: Diagnosis not present

## 2023-06-14 LAB — CBC WITH DIFFERENTIAL/PLATELET
Basophils Absolute: 0.1 10*3/uL (ref 0.0–0.1)
Basophils Relative: 0.7 % (ref 0.0–3.0)
Eosinophils Absolute: 0.2 10*3/uL (ref 0.0–0.7)
Eosinophils Relative: 2.7 % (ref 0.0–5.0)
HCT: 38.6 % (ref 36.0–46.0)
Hemoglobin: 12.5 g/dL (ref 12.0–15.0)
Lymphocytes Relative: 20.9 % (ref 12.0–46.0)
Lymphs Abs: 1.5 10*3/uL (ref 0.7–4.0)
MCHC: 32.3 g/dL (ref 30.0–36.0)
MCV: 85.7 fL (ref 78.0–100.0)
Monocytes Absolute: 0.5 10*3/uL (ref 0.1–1.0)
Monocytes Relative: 7.2 % (ref 3.0–12.0)
Neutro Abs: 4.9 10*3/uL (ref 1.4–7.7)
Neutrophils Relative %: 68.5 % (ref 43.0–77.0)
Platelets: 328 10*3/uL (ref 150.0–400.0)
RBC: 4.5 Mil/uL (ref 3.87–5.11)
RDW: 16.2 % — ABNORMAL HIGH (ref 11.5–15.5)
WBC: 7.1 10*3/uL (ref 4.0–10.5)

## 2023-06-14 LAB — VITAMIN D 25 HYDROXY (VIT D DEFICIENCY, FRACTURES): VITD: 46.5 ng/mL (ref 30.00–100.00)

## 2023-06-14 LAB — COMPREHENSIVE METABOLIC PANEL
ALT: 12 U/L (ref 0–35)
AST: 13 U/L (ref 0–37)
Albumin: 4.7 g/dL (ref 3.5–5.2)
Alkaline Phosphatase: 101 U/L (ref 39–117)
BUN: 20 mg/dL (ref 6–23)
CO2: 24 meq/L (ref 19–32)
Calcium: 9.2 mg/dL (ref 8.4–10.5)
Chloride: 103 meq/L (ref 96–112)
Creatinine, Ser: 0.77 mg/dL (ref 0.40–1.20)
GFR: 83.98 mL/min (ref >60.00)
Glucose, Bld: 100 mg/dL — ABNORMAL HIGH (ref 70–99)
Potassium: 4 meq/L (ref 3.5–5.1)
Sodium: 136 meq/L (ref 135–145)
Total Bilirubin: 0.7 mg/dL (ref 0.2–1.2)
Total Protein: 7.3 g/dL (ref 6.0–8.3)

## 2023-06-14 LAB — HEMOGLOBIN A1C: Hgb A1c MFr Bld: 5.4 % (ref 4.6–6.5)

## 2023-06-14 LAB — LIPID PANEL
Cholesterol: 171 mg/dL (ref 0–200)
HDL: 51.2 mg/dL (ref >39.00)
LDL Cholesterol: 101 mg/dL — ABNORMAL HIGH (ref 0–99)
NonHDL: 120.2
Total CHOL/HDL Ratio: 3
Triglycerides: 96 mg/dL (ref 0.0–149.0)
VLDL: 19.2 mg/dL (ref 0.0–40.0)

## 2023-06-15 ENCOUNTER — Other Ambulatory Visit: Payer: 59

## 2023-06-24 ENCOUNTER — Ambulatory Visit: Payer: 59 | Admitting: Podiatry

## 2023-06-30 ENCOUNTER — Other Ambulatory Visit (HOSPITAL_BASED_OUTPATIENT_CLINIC_OR_DEPARTMENT_OTHER): Payer: Self-pay

## 2023-06-30 MED ORDER — CELECOXIB 200 MG PO CAPS
200.0000 mg | ORAL_CAPSULE | Freq: Two times a day (BID) | ORAL | 1 refills | Status: AC
Start: 2023-06-10 — End: ?
  Filled 2023-07-05: qty 60, 30d supply, fill #0
  Filled 2023-07-27: qty 60, 30d supply, fill #1
  Filled 2023-09-27 – 2023-10-19 (×2): qty 60, 30d supply, fill #2
  Filled 2023-11-14: qty 60, 30d supply, fill #3
  Filled 2024-05-06: qty 60, 30d supply, fill #4

## 2023-06-30 MED ORDER — PROGESTERONE MICRONIZED 100 MG PO CAPS
100.0000 mg | ORAL_CAPSULE | Freq: Every day | ORAL | 5 refills | Status: DC
  Filled 2023-07-27: qty 30, 30d supply, fill #0

## 2023-06-30 MED ORDER — PANTOPRAZOLE SODIUM 40 MG PO TBEC
40.0000 mg | DELAYED_RELEASE_TABLET | Freq: Every day | ORAL | 1 refills | Status: DC
  Filled 2023-07-08: qty 30, 30d supply, fill #0

## 2023-06-30 MED ORDER — ONDANSETRON 4 MG PO TBDP
4.0000 mg | ORAL_TABLET | Freq: Three times a day (TID) | ORAL | 1 refills | Status: DC
Start: 2023-02-02 — End: 2024-04-24
  Filled 2023-07-05: qty 9, 30d supply, fill #0

## 2023-07-01 ENCOUNTER — Other Ambulatory Visit (HOSPITAL_BASED_OUTPATIENT_CLINIC_OR_DEPARTMENT_OTHER): Payer: Self-pay

## 2023-07-01 ENCOUNTER — Ambulatory Visit (INDEPENDENT_AMBULATORY_CARE_PROVIDER_SITE_OTHER): Payer: 59

## 2023-07-01 DIAGNOSIS — I495 Sick sinus syndrome: Secondary | ICD-10-CM | POA: Diagnosis not present

## 2023-07-02 LAB — CUP PACEART REMOTE DEVICE CHECK
Battery Remaining Longevity: 33 mo
Battery Voltage: 2.9 V
Brady Statistic AP VP Percent: 98.17 %
Brady Statistic AP VS Percent: 1.72 %
Brady Statistic AS VP Percent: 0.06 %
Brady Statistic AS VS Percent: 0.05 %
Brady Statistic RA Percent Paced: 99.88 %
Brady Statistic RV Percent Paced: 75.39 %
Date Time Interrogation Session: 20241113214129
Implantable Lead Connection Status: 753985
Implantable Lead Connection Status: 753985
Implantable Lead Connection Status: 753985
Implantable Lead Implant Date: 20171023
Implantable Lead Implant Date: 20171023
Implantable Lead Implant Date: 20171023
Implantable Lead Location: 753858
Implantable Lead Location: 753859
Implantable Lead Location: 753860
Implantable Lead Model: 4298
Implantable Lead Model: 5076
Implantable Lead Model: 5076
Implantable Pulse Generator Implant Date: 20171023
Lead Channel Impedance Value: 304 Ohm
Lead Channel Impedance Value: 361 Ohm
Lead Channel Impedance Value: 361 Ohm
Lead Channel Impedance Value: 399 Ohm
Lead Channel Impedance Value: 437 Ohm
Lead Channel Impedance Value: 532 Ohm
Lead Channel Impedance Value: 627 Ohm
Lead Channel Impedance Value: 646 Ohm
Lead Channel Impedance Value: 665 Ohm
Lead Channel Impedance Value: 722 Ohm
Lead Channel Impedance Value: 741 Ohm
Lead Channel Impedance Value: 817 Ohm
Lead Channel Impedance Value: 893 Ohm
Lead Channel Impedance Value: 931 Ohm
Lead Channel Pacing Threshold Amplitude: 0.5 V
Lead Channel Pacing Threshold Amplitude: 0.5 V
Lead Channel Pacing Threshold Amplitude: 1.125 V
Lead Channel Pacing Threshold Pulse Width: 0.4 ms
Lead Channel Pacing Threshold Pulse Width: 0.4 ms
Lead Channel Pacing Threshold Pulse Width: 0.4 ms
Lead Channel Sensing Intrinsic Amplitude: 1.625 mV
Lead Channel Sensing Intrinsic Amplitude: 1.625 mV
Lead Channel Sensing Intrinsic Amplitude: 11 mV
Lead Channel Sensing Intrinsic Amplitude: 11 mV
Lead Channel Setting Pacing Amplitude: 1.75 V
Lead Channel Setting Pacing Amplitude: 2 V
Lead Channel Setting Pacing Amplitude: 2.5 V
Lead Channel Setting Pacing Pulse Width: 0.4 ms
Lead Channel Setting Pacing Pulse Width: 0.4 ms
Lead Channel Setting Sensing Sensitivity: 2 mV
Zone Setting Status: 755011
Zone Setting Status: 755011

## 2023-07-05 ENCOUNTER — Other Ambulatory Visit: Payer: Self-pay

## 2023-07-05 ENCOUNTER — Other Ambulatory Visit (HOSPITAL_BASED_OUTPATIENT_CLINIC_OR_DEPARTMENT_OTHER): Payer: Self-pay

## 2023-07-08 ENCOUNTER — Other Ambulatory Visit (HOSPITAL_BASED_OUTPATIENT_CLINIC_OR_DEPARTMENT_OTHER): Payer: Self-pay

## 2023-07-14 ENCOUNTER — Other Ambulatory Visit (HOSPITAL_BASED_OUTPATIENT_CLINIC_OR_DEPARTMENT_OTHER): Payer: Self-pay

## 2023-07-14 MED ORDER — NEOMYCIN-POLYMYXIN-DEXAMETH 3.5-10000-0.1 OP OINT
TOPICAL_OINTMENT | OPHTHALMIC | 0 refills | Status: DC
  Filled 2023-07-14: qty 3.5, 7d supply, fill #0

## 2023-07-16 ENCOUNTER — Other Ambulatory Visit: Payer: Self-pay

## 2023-07-16 ENCOUNTER — Other Ambulatory Visit (HOSPITAL_BASED_OUTPATIENT_CLINIC_OR_DEPARTMENT_OTHER): Payer: Self-pay

## 2023-07-16 MED ORDER — LORAZEPAM 1 MG PO TABS
1.0000 mg | ORAL_TABLET | ORAL | 0 refills | Status: DC
  Filled 2023-07-16: qty 2, 1d supply, fill #0

## 2023-07-16 MED ORDER — DIAZEPAM 5 MG PO TABS
5.0000 mg | ORAL_TABLET | ORAL | 0 refills | Status: DC
  Filled 2023-07-16: qty 2, 1d supply, fill #0

## 2023-07-17 ENCOUNTER — Other Ambulatory Visit (HOSPITAL_BASED_OUTPATIENT_CLINIC_OR_DEPARTMENT_OTHER): Payer: Self-pay

## 2023-07-19 NOTE — Progress Notes (Signed)
Remote pacemaker transmission.   

## 2023-07-21 ENCOUNTER — Other Ambulatory Visit (HOSPITAL_BASED_OUTPATIENT_CLINIC_OR_DEPARTMENT_OTHER): Payer: Self-pay

## 2023-07-21 MED ORDER — PANTOPRAZOLE SODIUM 40 MG PO TBEC
40.0000 mg | DELAYED_RELEASE_TABLET | Freq: Two times a day (BID) | ORAL | 1 refills | Status: DC
Start: 2023-07-21 — End: 2024-03-09
  Filled 2023-07-21: qty 60, 30d supply, fill #0
  Filled 2023-08-27: qty 60, 30d supply, fill #1
  Filled 2023-09-27 – 2023-09-28 (×2): qty 60, 30d supply, fill #2
  Filled 2023-10-29: qty 60, 30d supply, fill #3
  Filled 2023-11-28: qty 60, 30d supply, fill #4
  Filled 2024-01-08: qty 60, 30d supply, fill #5

## 2023-07-27 ENCOUNTER — Other Ambulatory Visit (HOSPITAL_BASED_OUTPATIENT_CLINIC_OR_DEPARTMENT_OTHER): Payer: Self-pay

## 2023-07-27 MED ORDER — PROGESTERONE 200 MG PO CAPS
200.0000 mg | ORAL_CAPSULE | Freq: Every evening | ORAL | 2 refills | Status: DC
  Filled 2023-07-27: qty 30, 30d supply, fill #0
  Filled 2023-08-24: qty 30, 30d supply, fill #1
  Filled 2023-09-27 – 2023-09-28 (×2): qty 30, 30d supply, fill #2
  Filled 2023-10-29: qty 30, 30d supply, fill #3
  Filled 2023-11-28 – 2023-11-29 (×2): qty 30, 30d supply, fill #4
  Filled 2023-12-28: qty 30, 30d supply, fill #5
  Filled 2024-01-25: qty 30, 30d supply, fill #6
  Filled 2024-02-22: qty 30, 30d supply, fill #7
  Filled 2024-03-25 (×2): qty 30, 30d supply, fill #8

## 2023-07-28 ENCOUNTER — Other Ambulatory Visit: Payer: Self-pay

## 2023-07-28 DIAGNOSIS — I471 Supraventricular tachycardia, unspecified: Secondary | ICD-10-CM

## 2023-07-28 MED ORDER — METOPROLOL SUCCINATE ER 100 MG PO TB24
100.0000 mg | ORAL_TABLET | Freq: Every day | ORAL | 11 refills | Status: DC
Start: 2023-07-28 — End: 2024-04-24
  Filled 2023-07-31: qty 30, 30d supply, fill #0
  Filled 2023-09-27 – 2023-09-28 (×2): qty 30, 30d supply, fill #1
  Filled 2023-10-29 – 2023-10-30 (×2): qty 30, 30d supply, fill #2

## 2023-07-31 ENCOUNTER — Other Ambulatory Visit (HOSPITAL_BASED_OUTPATIENT_CLINIC_OR_DEPARTMENT_OTHER): Payer: Self-pay

## 2023-08-02 ENCOUNTER — Other Ambulatory Visit (HOSPITAL_COMMUNITY): Payer: Self-pay

## 2023-08-02 MED ORDER — METOPROLOL SUCCINATE ER 100 MG PO TB24
100.0000 mg | ORAL_TABLET | Freq: Every day | ORAL | 11 refills | Status: DC
  Filled 2023-08-02 – 2023-08-30 (×3): qty 30, 30d supply, fill #0
  Filled 2023-11-28: qty 30, 30d supply, fill #1
  Filled 2023-12-28: qty 30, 30d supply, fill #2
  Filled 2024-01-25: qty 30, 30d supply, fill #3
  Filled 2024-02-29: qty 30, 30d supply, fill #4
  Filled 2024-03-29 (×2): qty 30, 30d supply, fill #5
  Filled 2024-04-27: qty 30, 30d supply, fill #6
  Filled 2024-05-26: qty 30, 30d supply, fill #7
  Filled 2024-06-26: qty 30, 30d supply, fill #8
  Filled 2024-07-26: qty 30, 30d supply, fill #9
  Filled ????-??-??: fill #5

## 2023-08-24 ENCOUNTER — Other Ambulatory Visit (HOSPITAL_COMMUNITY): Payer: Self-pay

## 2023-08-24 ENCOUNTER — Other Ambulatory Visit (HOSPITAL_BASED_OUTPATIENT_CLINIC_OR_DEPARTMENT_OTHER): Payer: Self-pay

## 2023-08-27 ENCOUNTER — Other Ambulatory Visit (HOSPITAL_BASED_OUTPATIENT_CLINIC_OR_DEPARTMENT_OTHER): Payer: Self-pay

## 2023-08-30 ENCOUNTER — Other Ambulatory Visit (HOSPITAL_BASED_OUTPATIENT_CLINIC_OR_DEPARTMENT_OTHER): Payer: Self-pay

## 2023-08-30 ENCOUNTER — Other Ambulatory Visit (HOSPITAL_COMMUNITY): Payer: Self-pay

## 2023-09-01 ENCOUNTER — Other Ambulatory Visit (HOSPITAL_BASED_OUTPATIENT_CLINIC_OR_DEPARTMENT_OTHER): Payer: Self-pay

## 2023-09-02 ENCOUNTER — Other Ambulatory Visit (HOSPITAL_BASED_OUTPATIENT_CLINIC_OR_DEPARTMENT_OTHER): Payer: Self-pay

## 2023-09-02 MED ORDER — AMOXICILLIN-POT CLAVULANATE 875-125 MG PO TABS
1.0000 | ORAL_TABLET | Freq: Two times a day (BID) | ORAL | 0 refills | Status: AC
Start: 2023-09-01 — End: 2023-09-14
  Filled 2023-09-02: qty 20, 10d supply, fill #0

## 2023-09-02 MED ORDER — FLUCONAZOLE 150 MG PO TABS
150.0000 mg | ORAL_TABLET | ORAL | 0 refills | Status: DC
  Filled 2023-09-02: qty 2, 4d supply, fill #0

## 2023-09-03 ENCOUNTER — Other Ambulatory Visit (HOSPITAL_BASED_OUTPATIENT_CLINIC_OR_DEPARTMENT_OTHER): Payer: Self-pay

## 2023-09-06 ENCOUNTER — Other Ambulatory Visit (HOSPITAL_BASED_OUTPATIENT_CLINIC_OR_DEPARTMENT_OTHER): Payer: Self-pay

## 2023-09-13 ENCOUNTER — Other Ambulatory Visit (HOSPITAL_BASED_OUTPATIENT_CLINIC_OR_DEPARTMENT_OTHER): Payer: Self-pay

## 2023-09-28 ENCOUNTER — Other Ambulatory Visit (HOSPITAL_BASED_OUTPATIENT_CLINIC_OR_DEPARTMENT_OTHER): Payer: Self-pay

## 2023-09-28 ENCOUNTER — Other Ambulatory Visit: Payer: Self-pay

## 2023-09-30 ENCOUNTER — Ambulatory Visit (INDEPENDENT_AMBULATORY_CARE_PROVIDER_SITE_OTHER): Payer: 59

## 2023-09-30 DIAGNOSIS — I495 Sick sinus syndrome: Secondary | ICD-10-CM | POA: Diagnosis not present

## 2023-10-02 LAB — CUP PACEART REMOTE DEVICE CHECK
Battery Remaining Longevity: 28 mo
Battery Voltage: 2.88 V
Brady Statistic AP VP Percent: 98.17 %
Brady Statistic AP VS Percent: 1.71 %
Brady Statistic AS VP Percent: 0.07 %
Brady Statistic AS VS Percent: 0.05 %
Brady Statistic RA Percent Paced: 99.89 %
Brady Statistic RV Percent Paced: 67.73 %
Date Time Interrogation Session: 20250212225605
Implantable Lead Connection Status: 753985
Implantable Lead Connection Status: 753985
Implantable Lead Connection Status: 753985
Implantable Lead Implant Date: 20171023
Implantable Lead Implant Date: 20171023
Implantable Lead Implant Date: 20171023
Implantable Lead Location: 753858
Implantable Lead Location: 753859
Implantable Lead Location: 753860
Implantable Lead Model: 4298
Implantable Lead Model: 5076
Implantable Lead Model: 5076
Implantable Pulse Generator Implant Date: 20171023
Lead Channel Impedance Value: 304 Ohm
Lead Channel Impedance Value: 361 Ohm
Lead Channel Impedance Value: 361 Ohm
Lead Channel Impedance Value: 399 Ohm
Lead Channel Impedance Value: 456 Ohm
Lead Channel Impedance Value: 532 Ohm
Lead Channel Impedance Value: 646 Ohm
Lead Channel Impedance Value: 646 Ohm
Lead Channel Impedance Value: 684 Ohm
Lead Channel Impedance Value: 722 Ohm
Lead Channel Impedance Value: 760 Ohm
Lead Channel Impedance Value: 817 Ohm
Lead Channel Impedance Value: 931 Ohm
Lead Channel Impedance Value: 988 Ohm
Lead Channel Pacing Threshold Amplitude: 0.5 V
Lead Channel Pacing Threshold Amplitude: 0.5 V
Lead Channel Pacing Threshold Amplitude: 1.25 V
Lead Channel Pacing Threshold Pulse Width: 0.4 ms
Lead Channel Pacing Threshold Pulse Width: 0.4 ms
Lead Channel Pacing Threshold Pulse Width: 0.4 ms
Lead Channel Sensing Intrinsic Amplitude: 12.125 mV
Lead Channel Sensing Intrinsic Amplitude: 12.125 mV
Lead Channel Sensing Intrinsic Amplitude: 2.125 mV
Lead Channel Sensing Intrinsic Amplitude: 2.125 mV
Lead Channel Setting Pacing Amplitude: 1.75 V
Lead Channel Setting Pacing Amplitude: 2 V
Lead Channel Setting Pacing Amplitude: 2.5 V
Lead Channel Setting Pacing Pulse Width: 0.4 ms
Lead Channel Setting Pacing Pulse Width: 0.4 ms
Lead Channel Setting Sensing Sensitivity: 2 mV
Zone Setting Status: 755011
Zone Setting Status: 755011

## 2023-10-05 ENCOUNTER — Other Ambulatory Visit (HOSPITAL_BASED_OUTPATIENT_CLINIC_OR_DEPARTMENT_OTHER): Payer: Self-pay

## 2023-10-05 MED ORDER — AZITHROMYCIN 250 MG PO TABS
ORAL_TABLET | ORAL | 0 refills | Status: DC
  Filled 2023-10-05: qty 6, 5d supply, fill #0

## 2023-10-05 MED ORDER — DEXAMETHASONE 4 MG PO TABS
4.0000 mg | ORAL_TABLET | Freq: Every day | ORAL | 0 refills | Status: AC
  Filled 2023-10-05: qty 5, 5d supply, fill #0

## 2023-10-07 ENCOUNTER — Other Ambulatory Visit (HOSPITAL_BASED_OUTPATIENT_CLINIC_OR_DEPARTMENT_OTHER): Payer: Self-pay

## 2023-10-08 ENCOUNTER — Other Ambulatory Visit (HOSPITAL_BASED_OUTPATIENT_CLINIC_OR_DEPARTMENT_OTHER): Payer: Self-pay

## 2023-10-08 MED ORDER — PROGESTERONE MICRONIZED 100 MG PO CAPS
100.0000 mg | ORAL_CAPSULE | Freq: Every day | ORAL | 5 refills | Status: DC
  Filled 2023-10-08 – 2023-12-02 (×2): qty 30, 30d supply, fill #0
  Filled 2023-12-28: qty 30, 30d supply, fill #1
  Filled 2024-01-25: qty 30, 30d supply, fill #2
  Filled 2024-03-25 (×2): qty 30, 30d supply, fill #3
  Filled 2024-04-22: qty 30, 30d supply, fill #4
  Filled 2024-05-26: qty 30, 30d supply, fill #5

## 2023-10-12 ENCOUNTER — Encounter: Payer: 59 | Admitting: Cardiology

## 2023-10-16 ENCOUNTER — Other Ambulatory Visit (HOSPITAL_BASED_OUTPATIENT_CLINIC_OR_DEPARTMENT_OTHER): Payer: Self-pay

## 2023-10-18 ENCOUNTER — Other Ambulatory Visit (HOSPITAL_BASED_OUTPATIENT_CLINIC_OR_DEPARTMENT_OTHER): Payer: Self-pay

## 2023-10-20 ENCOUNTER — Other Ambulatory Visit (HOSPITAL_BASED_OUTPATIENT_CLINIC_OR_DEPARTMENT_OTHER): Payer: Self-pay

## 2023-10-27 ENCOUNTER — Other Ambulatory Visit (HOSPITAL_BASED_OUTPATIENT_CLINIC_OR_DEPARTMENT_OTHER): Payer: Self-pay

## 2023-10-27 MED ORDER — LOTEPREDNOL ETABONATE 0.5 % OP SUSP
1.0000 [drp] | Freq: Four times a day (QID) | OPHTHALMIC | 0 refills | Status: AC
  Filled 2023-10-27: qty 5, 7d supply, fill #0

## 2023-10-29 ENCOUNTER — Other Ambulatory Visit (HOSPITAL_BASED_OUTPATIENT_CLINIC_OR_DEPARTMENT_OTHER): Payer: Self-pay

## 2023-10-30 ENCOUNTER — Other Ambulatory Visit (HOSPITAL_BASED_OUTPATIENT_CLINIC_OR_DEPARTMENT_OTHER): Payer: Self-pay

## 2023-11-03 NOTE — Addendum Note (Signed)
 Addended by: Elease Etienne A on: 11/03/2023 12:18 PM   Modules accepted: Orders

## 2023-11-03 NOTE — Progress Notes (Signed)
 Remote pacemaker transmission.

## 2023-11-24 ENCOUNTER — Ambulatory Visit: Payer: 59 | Attending: Cardiology | Admitting: Cardiology

## 2023-11-24 ENCOUNTER — Other Ambulatory Visit (HOSPITAL_BASED_OUTPATIENT_CLINIC_OR_DEPARTMENT_OTHER): Payer: Self-pay

## 2023-11-24 ENCOUNTER — Encounter: Payer: Self-pay | Admitting: Cardiology

## 2023-11-24 VITALS — BP 128/80 | HR 65 | Ht 72.0 in | Wt 254.2 lb

## 2023-11-24 DIAGNOSIS — I471 Supraventricular tachycardia, unspecified: Secondary | ICD-10-CM | POA: Diagnosis not present

## 2023-11-24 DIAGNOSIS — I428 Other cardiomyopathies: Secondary | ICD-10-CM | POA: Diagnosis not present

## 2023-11-24 DIAGNOSIS — I4819 Other persistent atrial fibrillation: Secondary | ICD-10-CM

## 2023-11-24 DIAGNOSIS — I495 Sick sinus syndrome: Secondary | ICD-10-CM | POA: Diagnosis not present

## 2023-11-24 LAB — CUP PACEART INCLINIC DEVICE CHECK
Date Time Interrogation Session: 20250409165154
Implantable Lead Connection Status: 753985
Implantable Lead Connection Status: 753985
Implantable Lead Connection Status: 753985
Implantable Lead Implant Date: 20171023
Implantable Lead Implant Date: 20171023
Implantable Lead Implant Date: 20171023
Implantable Lead Location: 753858
Implantable Lead Location: 753859
Implantable Lead Location: 753860
Implantable Lead Model: 4298
Implantable Lead Model: 5076
Implantable Lead Model: 5076
Implantable Pulse Generator Implant Date: 20171023

## 2023-11-24 NOTE — Progress Notes (Signed)
  Electrophysiology Office Note:   Date:  11/24/2023  ID:  Destiny Dennis, DOB 07-24-1961, MRN [id number]  Primary Cardiologist: None Primary Heart Failure: None Electrophysiologist: Brendin Situ Jorja Loa, MD      History of Present Illness:   Destiny Dennis is a 63 y.o. female with h/o sinus node dysfunction, chronic systolic heart failure, tachybradycardia syndrome, persistent atrial fibrillation post maze and ASD repair seen today for routine electrophysiology followup.   Since last being seen in our clinic the patient reports doing well.  She has no complaints.  She has no chest pain or shortness of breath.  She remains quite active, playing pickle ball multiple times a week.  she denies chest pain, palpitations, dyspnea, PND, orthopnea, nausea, vomiting, dizziness, syncope, edema, weight gain, or early satiety.   Review of systems complete and found to be negative unless listed in HPI.      EP Information / Studies Reviewed:    EKG is ordered today. Personal review as below.      PPM Interrogation-  reviewed in detail today,  See PACEART report.  Device History: Medtronic BiV PPM implanted 2017 for Tachy-Brady syndrome  Risk Assessment/Calculations:    CHA2DS2-VASc Score = 2   This indicates a 2.2% annual risk of stroke. The patient's score is based upon: CHF History: 1 HTN History: 0 Diabetes History: 0 Stroke History: 0 Vascular Disease History: 0 Age Score: 0 Gender Score: 1             Physical Exam:   VS:  BP 128/80   Pulse 65   Ht 6' (1.829 m)   Wt 254 lb 3.2 oz (115.3 kg)   LMP 04/17/2012   SpO2 97%   BMI 34.48 kg/m    Wt Readings from Last 3 Encounters:  11/24/23 254 lb 3.2 oz (115.3 kg)  06/09/23 260 lb (117.9 kg)  01/29/23 260 lb 6.4 oz (118.1 kg)     GEN: Well nourished, well developed in no acute distress NECK: No JVD; No carotid bruits CARDIAC: Regular rate and rhythm, no murmurs, rubs, gallops RESPIRATORY:  Clear to auscultation  without rales, wheezing or rhonchi  ABDOMEN: Soft, non-tender, non-distended EXTREMITIES:  No edema; No deformity   ASSESSMENT AND PLAN:    Tachy-Brady syndrome s/p Medtronic PPM  Normal PPM function See Pace Art report Sensing, threshold, impedance within normal limits Programming reviewed and stable for patient No changes today  2.  Persistent atrial fibrillation: Post ablation.  No further episodes.  3.  Secondary hypercoagulable state: Post left atrial appendage clip  Disposition:   Follow up with EP APP in 12 months  Signed, Tondalaya Perren Jorja Loa, MD

## 2023-11-24 NOTE — Patient Instructions (Signed)
 Medication Instructions:  Your physician recommends that you continue on your current medications as directed. Please refer to the Current Medication list given to you today.  *If you need a refill on your cardiac medications before your next appointment, please call your pharmacy*  Lab Work: None ordered.  If you have labs (blood work) drawn today and your tests are completely normal, you will receive your results only by: MyChart Message (if you have MyChart) OR A paper copy in the mail If you have any lab test that is abnormal or we need to change your treatment, we will call you to review the results.  Testing/Procedures: None ordered.   Follow-Up: At [facility], you and your health needs are our priority.  As part of our continuing mission to provide you with exceptional heart care, our providers are all part of one team.  This team includes your primary Cardiologist (physician) and Advanced Practice Providers or APPs (Physician Assistants and Nurse Practitioners) who all work together to provide you with the care you need, when you need it.  Your next appointment:    12 months with Dr Elberta Fortis PA      1st Floor: - Lobby - Registration  - Pharmacy  - Lab - Cafe  2nd Floor: - PV Lab - Diagnostic Testing (echo, CT, nuclear med)  3rd Floor: - Vacant  4th Floor: - TCTS (cardiothoracic surgery) - AFib Clinic - Structural Heart Clinic - Vascular Surgery  - Vascular Ultrasound  5th Floor: - [facility] (general and EP) - Clinical Pharmacy for coumadin, hypertension, lipid, weight-loss medications, and med management appointments    Valet parking services will be available as well.

## 2023-11-29 ENCOUNTER — Other Ambulatory Visit (HOSPITAL_BASED_OUTPATIENT_CLINIC_OR_DEPARTMENT_OTHER): Payer: Self-pay

## 2023-11-29 ENCOUNTER — Other Ambulatory Visit: Payer: Self-pay

## 2023-12-02 ENCOUNTER — Other Ambulatory Visit (HOSPITAL_BASED_OUTPATIENT_CLINIC_OR_DEPARTMENT_OTHER): Payer: Self-pay

## 2023-12-04 ENCOUNTER — Other Ambulatory Visit (HOSPITAL_BASED_OUTPATIENT_CLINIC_OR_DEPARTMENT_OTHER): Payer: Self-pay

## 2023-12-28 ENCOUNTER — Other Ambulatory Visit (HOSPITAL_BASED_OUTPATIENT_CLINIC_OR_DEPARTMENT_OTHER): Payer: Self-pay

## 2023-12-29 ENCOUNTER — Other Ambulatory Visit (HOSPITAL_BASED_OUTPATIENT_CLINIC_OR_DEPARTMENT_OTHER): Payer: Self-pay

## 2023-12-30 ENCOUNTER — Ambulatory Visit (INDEPENDENT_AMBULATORY_CARE_PROVIDER_SITE_OTHER): Payer: 59

## 2023-12-30 DIAGNOSIS — R001 Bradycardia, unspecified: Secondary | ICD-10-CM

## 2023-12-30 LAB — CUP PACEART REMOTE DEVICE CHECK
Battery Remaining Longevity: 25 mo
Battery Voltage: 2.87 V
Brady Statistic AP VP Percent: 98.22 %
Brady Statistic AP VS Percent: 1.66 %
Brady Statistic AS VP Percent: 0.06 %
Brady Statistic AS VS Percent: 0.05 %
Brady Statistic RA Percent Paced: 99.89 %
Brady Statistic RV Percent Paced: 84.48 %
Date Time Interrogation Session: 20250514221145
Implantable Lead Connection Status: 753985
Implantable Lead Connection Status: 753985
Implantable Lead Connection Status: 753985
Implantable Lead Implant Date: 20171023
Implantable Lead Implant Date: 20171023
Implantable Lead Implant Date: 20171023
Implantable Lead Location: 753858
Implantable Lead Location: 753859
Implantable Lead Location: 753860
Implantable Lead Model: 4298
Implantable Lead Model: 5076
Implantable Lead Model: 5076
Implantable Pulse Generator Implant Date: 20171023
Lead Channel Impedance Value: 304 Ohm
Lead Channel Impedance Value: 342 Ohm
Lead Channel Impedance Value: 361 Ohm
Lead Channel Impedance Value: 399 Ohm
Lead Channel Impedance Value: 437 Ohm
Lead Channel Impedance Value: 551 Ohm
Lead Channel Impedance Value: 627 Ohm
Lead Channel Impedance Value: 646 Ohm
Lead Channel Impedance Value: 646 Ohm
Lead Channel Impedance Value: 703 Ohm
Lead Channel Impedance Value: 760 Ohm
Lead Channel Impedance Value: 817 Ohm
Lead Channel Impedance Value: 912 Ohm
Lead Channel Impedance Value: 969 Ohm
Lead Channel Pacing Threshold Amplitude: 0.375 V
Lead Channel Pacing Threshold Amplitude: 0.5 V
Lead Channel Pacing Threshold Amplitude: 1.25 V
Lead Channel Pacing Threshold Pulse Width: 0.4 ms
Lead Channel Pacing Threshold Pulse Width: 0.4 ms
Lead Channel Pacing Threshold Pulse Width: 0.4 ms
Lead Channel Sensing Intrinsic Amplitude: 10.375 mV
Lead Channel Sensing Intrinsic Amplitude: 10.375 mV
Lead Channel Sensing Intrinsic Amplitude: 2.75 mV
Lead Channel Sensing Intrinsic Amplitude: 2.75 mV
Lead Channel Setting Pacing Amplitude: 1.75 V
Lead Channel Setting Pacing Amplitude: 2 V
Lead Channel Setting Pacing Amplitude: 2.5 V
Lead Channel Setting Pacing Pulse Width: 0.4 ms
Lead Channel Setting Pacing Pulse Width: 0.4 ms
Lead Channel Setting Sensing Sensitivity: 2 mV
Zone Setting Status: 755011
Zone Setting Status: 755011

## 2024-01-02 ENCOUNTER — Ambulatory Visit: Payer: Self-pay | Admitting: Cardiology

## 2024-01-26 ENCOUNTER — Other Ambulatory Visit (HOSPITAL_BASED_OUTPATIENT_CLINIC_OR_DEPARTMENT_OTHER): Payer: Self-pay

## 2024-01-26 ENCOUNTER — Ambulatory Visit: Admitting: Physician Assistant

## 2024-01-27 ENCOUNTER — Other Ambulatory Visit (HOSPITAL_BASED_OUTPATIENT_CLINIC_OR_DEPARTMENT_OTHER): Payer: Self-pay

## 2024-02-08 ENCOUNTER — Other Ambulatory Visit (HOSPITAL_BASED_OUTPATIENT_CLINIC_OR_DEPARTMENT_OTHER): Payer: Self-pay

## 2024-02-08 MED ORDER — PROGESTERONE MICRONIZED 100 MG PO CAPS
100.0000 mg | ORAL_CAPSULE | Freq: Every day | ORAL | 5 refills | Status: DC
  Filled 2024-02-08 – 2024-02-22 (×2): qty 30, 30d supply, fill #0

## 2024-02-08 NOTE — Progress Notes (Signed)
 Remote pacemaker transmission.

## 2024-02-16 ENCOUNTER — Other Ambulatory Visit: Payer: Self-pay | Admitting: Obstetrics and Gynecology

## 2024-02-16 DIAGNOSIS — Z1231 Encounter for screening mammogram for malignant neoplasm of breast: Secondary | ICD-10-CM

## 2024-02-19 ENCOUNTER — Other Ambulatory Visit (HOSPITAL_BASED_OUTPATIENT_CLINIC_OR_DEPARTMENT_OTHER): Payer: Self-pay

## 2024-02-21 ENCOUNTER — Other Ambulatory Visit (HOSPITAL_BASED_OUTPATIENT_CLINIC_OR_DEPARTMENT_OTHER): Payer: Self-pay

## 2024-02-22 ENCOUNTER — Other Ambulatory Visit (HOSPITAL_BASED_OUTPATIENT_CLINIC_OR_DEPARTMENT_OTHER): Payer: Self-pay

## 2024-03-09 ENCOUNTER — Other Ambulatory Visit (HOSPITAL_BASED_OUTPATIENT_CLINIC_OR_DEPARTMENT_OTHER): Payer: Self-pay

## 2024-03-09 MED ORDER — PANTOPRAZOLE SODIUM 40 MG PO TBEC
40.0000 mg | DELAYED_RELEASE_TABLET | Freq: Two times a day (BID) | ORAL | 2 refills | Status: AC
  Filled 2024-03-09: qty 60, 30d supply, fill #0
  Filled 2024-04-06 – 2024-05-06 (×2): qty 60, 30d supply, fill #1
  Filled 2024-06-26: qty 60, 30d supply, fill #2

## 2024-03-25 ENCOUNTER — Other Ambulatory Visit (HOSPITAL_BASED_OUTPATIENT_CLINIC_OR_DEPARTMENT_OTHER): Payer: Self-pay

## 2024-03-28 ENCOUNTER — Other Ambulatory Visit (HOSPITAL_BASED_OUTPATIENT_CLINIC_OR_DEPARTMENT_OTHER): Payer: Self-pay

## 2024-03-28 ENCOUNTER — Ambulatory Visit
Admission: RE | Admit: 2024-03-28 | Discharge: 2024-03-28 | Disposition: A | Discharge status: Home / Self Care | Source: Ambulatory Visit

## 2024-03-28 DIAGNOSIS — Z1231 Encounter for screening mammogram for malignant neoplasm of breast: Secondary | ICD-10-CM

## 2024-03-28 MED ORDER — FLUCONAZOLE 150 MG PO TABS
150.0000 mg | ORAL_TABLET | ORAL | 0 refills | Status: DC
  Filled 2024-03-28: qty 2, 4d supply, fill #0

## 2024-03-29 ENCOUNTER — Telehealth: Payer: Self-pay | Admitting: Podiatry

## 2024-03-29 ENCOUNTER — Other Ambulatory Visit (HOSPITAL_BASED_OUTPATIENT_CLINIC_OR_DEPARTMENT_OTHER): Payer: Self-pay

## 2024-03-29 NOTE — Telephone Encounter (Signed)
 Called and left message for patient to call and get appointment rescheduled as she is an established patient in a new patient spot.

## 2024-03-30 ENCOUNTER — Ambulatory Visit (INDEPENDENT_AMBULATORY_CARE_PROVIDER_SITE_OTHER): Payer: 59

## 2024-03-30 DIAGNOSIS — R001 Bradycardia, unspecified: Secondary | ICD-10-CM | POA: Diagnosis not present

## 2024-03-30 LAB — CUP PACEART REMOTE DEVICE CHECK
Battery Remaining Longevity: 22 mo
Battery Voltage: 2.85 V
Brady Statistic AP VP Percent: 98.14 %
Brady Statistic AP VS Percent: 1.64 %
Brady Statistic AS VP Percent: 0.13 %
Brady Statistic AS VS Percent: 0.1 %
Brady Statistic RA Percent Paced: 99.79 %
Brady Statistic RV Percent Paced: 72.46 %
Date Time Interrogation Session: 20250813224150
Implantable Lead Connection Status: 753985
Implantable Lead Connection Status: 753985
Implantable Lead Connection Status: 753985
Implantable Lead Implant Date: 20171023
Implantable Lead Implant Date: 20171023
Implantable Lead Implant Date: 20171023
Implantable Lead Location: 753858
Implantable Lead Location: 753859
Implantable Lead Location: 753860
Implantable Lead Model: 4298
Implantable Lead Model: 5076
Implantable Lead Model: 5076
Implantable Pulse Generator Implant Date: 20171023
Lead Channel Impedance Value: 285 Ohm
Lead Channel Impedance Value: 342 Ohm
Lead Channel Impedance Value: 342 Ohm
Lead Channel Impedance Value: 399 Ohm
Lead Channel Impedance Value: 437 Ohm
Lead Channel Impedance Value: 532 Ohm
Lead Channel Impedance Value: 608 Ohm
Lead Channel Impedance Value: 646 Ohm
Lead Channel Impedance Value: 665 Ohm
Lead Channel Impedance Value: 684 Ohm
Lead Channel Impedance Value: 741 Ohm
Lead Channel Impedance Value: 760 Ohm
Lead Channel Impedance Value: 798 Ohm
Lead Channel Impedance Value: 817 Ohm
Lead Channel Pacing Threshold Amplitude: 0.375 V
Lead Channel Pacing Threshold Amplitude: 0.5 V
Lead Channel Pacing Threshold Amplitude: 1.25 V
Lead Channel Pacing Threshold Pulse Width: 0.4 ms
Lead Channel Pacing Threshold Pulse Width: 0.4 ms
Lead Channel Pacing Threshold Pulse Width: 0.4 ms
Lead Channel Sensing Intrinsic Amplitude: 1.5 mV
Lead Channel Sensing Intrinsic Amplitude: 1.5 mV
Lead Channel Sensing Intrinsic Amplitude: 8.625 mV
Lead Channel Sensing Intrinsic Amplitude: 8.625 mV
Lead Channel Setting Pacing Amplitude: 1.75 V
Lead Channel Setting Pacing Amplitude: 2 V
Lead Channel Setting Pacing Amplitude: 2.5 V
Lead Channel Setting Pacing Pulse Width: 0.4 ms
Lead Channel Setting Pacing Pulse Width: 0.4 ms
Lead Channel Setting Sensing Sensitivity: 2 mV
Zone Setting Status: 755011
Zone Setting Status: 755011

## 2024-03-31 ENCOUNTER — Ambulatory Visit: Admitting: Podiatry

## 2024-04-01 ENCOUNTER — Ambulatory Visit: Payer: Self-pay | Admitting: Cardiology

## 2024-04-12 ENCOUNTER — Ambulatory Visit: Admitting: Podiatry

## 2024-04-12 ENCOUNTER — Telehealth: Payer: Self-pay | Admitting: Podiatry

## 2024-04-12 NOTE — Telephone Encounter (Signed)
 Called and left message for patient that appointment was cancelled per her response to automated system.

## 2024-04-18 ENCOUNTER — Other Ambulatory Visit (HOSPITAL_BASED_OUTPATIENT_CLINIC_OR_DEPARTMENT_OTHER): Payer: Self-pay

## 2024-04-22 ENCOUNTER — Other Ambulatory Visit (HOSPITAL_BASED_OUTPATIENT_CLINIC_OR_DEPARTMENT_OTHER): Payer: Self-pay

## 2024-04-24 ENCOUNTER — Ambulatory Visit (INDEPENDENT_AMBULATORY_CARE_PROVIDER_SITE_OTHER): Admitting: Podiatry

## 2024-04-24 ENCOUNTER — Other Ambulatory Visit (HOSPITAL_BASED_OUTPATIENT_CLINIC_OR_DEPARTMENT_OTHER): Payer: Self-pay

## 2024-04-24 ENCOUNTER — Other Ambulatory Visit: Payer: Self-pay

## 2024-04-24 ENCOUNTER — Ambulatory Visit (INDEPENDENT_AMBULATORY_CARE_PROVIDER_SITE_OTHER)

## 2024-04-24 ENCOUNTER — Encounter: Payer: Self-pay | Admitting: Podiatry

## 2024-04-24 VITALS — Ht 72.0 in | Wt 254.0 lb

## 2024-04-24 DIAGNOSIS — M2041 Other hammer toe(s) (acquired), right foot: Secondary | ICD-10-CM

## 2024-04-24 DIAGNOSIS — M7751 Other enthesopathy of right foot: Secondary | ICD-10-CM | POA: Diagnosis not present

## 2024-04-24 DIAGNOSIS — M778 Other enthesopathies, not elsewhere classified: Secondary | ICD-10-CM

## 2024-04-24 MED ORDER — PROGESTERONE 200 MG PO CAPS
200.0000 mg | ORAL_CAPSULE | Freq: Every evening | ORAL | 2 refills | Status: AC
  Filled 2024-04-24: qty 30, 30d supply, fill #0
  Filled 2024-05-26: qty 30, 30d supply, fill #1
  Filled 2024-06-26: qty 30, 30d supply, fill #2
  Filled 2024-07-26: qty 30, 30d supply, fill #3
  Filled 2024-08-29: qty 30, 30d supply, fill #4

## 2024-04-24 MED ORDER — TRIAMCINOLONE ACETONIDE 10 MG/ML IJ SUSP
10.0000 mg | Freq: Once | INTRAMUSCULAR | Status: AC
Start: 2024-04-24 — End: 2024-04-24
  Administered 2024-04-24: 10 mg via INTRA_ARTICULAR

## 2024-04-24 NOTE — Progress Notes (Signed)
 Subjective:   Patient ID: Destiny Dennis, female   DOB: 63 y.o.   MRN: [id number]   HPI   States she had a lot of pain on her right ankle and that she plays pickle ball 5-6 times a week.  Also her left second toe has been mildly irritated and she does have collapse of the arch history of bunion correction history of digital correction and is noted to have elevated 3rd, 4th and 5th digits on the right foot.  With history of surgery on the second digit right foot   ROS      Objective:  Physical Exam  Neurovascular status intact with patient noted to have significant collapse of medial longitudinal arch bilateral inflammation and pain of the sinus tarsi right with fluid buildup of the area and digital deformities with moderate rigid contracture of digits 345 right over left foot     Assessment:  Hammertoe deformity of the lesser digits right over left and noted to have inflammatory capsulitis of the sinus tarsi right with structural collapse medial longitudinal arch bilateral     Plan:  H&P reviewed I went ahead today sterile prep and I injected the sinus tarsi right 3 mg Kenalog  5 mg Xylocaine  and applied sterile dressing.  I discussed orthotics which I think would be well for her and also reviewed the digits and I do think digital fusion will probably be necessary but this is something we would like to try to hold off on as long as possible  X-rays indicate digital deformities of the right lesser toes with history of Lapidus fusion right and moderate collapse medial longitudinal arch right

## 2024-04-27 ENCOUNTER — Other Ambulatory Visit (HOSPITAL_BASED_OUTPATIENT_CLINIC_OR_DEPARTMENT_OTHER): Payer: Self-pay

## 2024-04-29 ENCOUNTER — Other Ambulatory Visit (HOSPITAL_BASED_OUTPATIENT_CLINIC_OR_DEPARTMENT_OTHER): Payer: Self-pay

## 2024-05-08 ENCOUNTER — Other Ambulatory Visit (HOSPITAL_BASED_OUTPATIENT_CLINIC_OR_DEPARTMENT_OTHER): Payer: Self-pay

## 2024-05-11 NOTE — Progress Notes (Signed)
 Remote PPM Transmission

## 2024-05-15 ENCOUNTER — Ambulatory Visit: Admitting: Podiatry

## 2024-05-22 ENCOUNTER — Ambulatory Visit (INDEPENDENT_AMBULATORY_CARE_PROVIDER_SITE_OTHER): Admitting: Family Medicine

## 2024-05-22 VITALS — BP 128/79 | HR 79 | Temp 97.9°F | Ht 72.0 in | Wt 233.6 lb

## 2024-05-22 DIAGNOSIS — E785 Hyperlipidemia, unspecified: Secondary | ICD-10-CM | POA: Diagnosis not present

## 2024-05-22 DIAGNOSIS — E669 Obesity, unspecified: Secondary | ICD-10-CM | POA: Diagnosis not present

## 2024-05-22 DIAGNOSIS — Z131 Encounter for screening for diabetes mellitus: Secondary | ICD-10-CM | POA: Diagnosis not present

## 2024-05-22 DIAGNOSIS — E559 Vitamin D deficiency, unspecified: Secondary | ICD-10-CM | POA: Diagnosis not present

## 2024-05-22 DIAGNOSIS — Z0000 Encounter for general adult medical examination without abnormal findings: Secondary | ICD-10-CM

## 2024-05-22 LAB — CBC WITH DIFFERENTIAL/PLATELET
Basophils Absolute: 0 K/uL (ref 0.0–0.1)
Basophils Relative: 0.4 % (ref 0.0–3.0)
Eosinophils Absolute: 0.2 K/uL (ref 0.0–0.7)
Eosinophils Relative: 2.4 % (ref 0.0–5.0)
HCT: 37.2 % (ref 36.0–46.0)
Hemoglobin: 12.6 g/dL (ref 12.0–15.0)
Lymphocytes Relative: 23.5 % (ref 12.0–46.0)
Lymphs Abs: 1.5 K/uL (ref 0.7–4.0)
MCHC: 33.8 g/dL (ref 30.0–36.0)
MCV: 86.3 fl (ref 78.0–100.0)
Monocytes Absolute: 0.4 K/uL (ref 0.1–1.0)
Monocytes Relative: 6.3 % (ref 3.0–12.0)
Neutro Abs: 4.3 K/uL (ref 1.4–7.7)
Neutrophils Relative %: 67.4 % (ref 43.0–77.0)
Platelets: 321 K/uL (ref 150.0–400.0)
RBC: 4.31 Mil/uL (ref 3.87–5.11)
RDW: 14.1 % (ref 11.5–15.5)
WBC: 6.4 K/uL (ref 4.0–10.5)

## 2024-05-22 LAB — VITAMIN D 25 HYDROXY (VIT D DEFICIENCY, FRACTURES): VITD: 68.52 ng/mL (ref 30.00–100.00)

## 2024-05-22 LAB — HEMOGLOBIN A1C: Hgb A1c MFr Bld: 5.3 % (ref 4.6–6.5)

## 2024-05-22 NOTE — Patient Instructions (Addendum)
 Please stop by lab before you go If you have mychart- we will send your results within 3 business days of us  receiving them.  If you do not have mychart- we will call you about results within 5 business days of us  receiving them.  *please also note that you will see labs on mychart as soon as they post. I will later go in and write notes on them- will say notes from Dr. Katrinka   Due for Tetanus, Diphtheria, and Pertussis (Tdap) in about 3 weeks- can call back for nurse visit or get at pharmacy  Recommended follow up: Return in about 1 year (around 05/22/2025) for physical or sooner if needed.Schedule b4 you leave.

## 2024-05-22 NOTE — Progress Notes (Signed)
 Phone [phone]   Subjective:  Patient presents today for their annual physical. Chief complaint-noted.   See problem oriented charting- ROS- full  review of systems was completed and negative except for topics noted under acute/chronic concerns   The following were reviewed and entered/updated in epic: Past Medical History:  Diagnosis Date   Allergic rhinitis    Allergy    Pollens   Anxiety    when tachycardia occurs   Arthritis    OA, s/p numerous surgeries -back, knees, hip   Benign fundic gland polyps of stomach    Cervical disc disease    Diverticulitis    CT Scan    DVT (deep venous thrombosis) (HCC)    in pregnancy, s/p hip surgery   Fatty liver    GERD 12/23/2009   diet related-not a problem   Hemorrhoids    Hiatal hernia    small   History of loop recorder    placed then removed   History of MRSA infection 2008   superficial skin-cleared easily with doxycycline    Hx of adenomatous polyp of colon 12/21/2021   Obesity    Persistent atrial fibrillation (HCC) 03/05/2007   a. s/p RFCA 12/18/2011, 07/29/12   Post-operative nausea and vomiting    Presence of permanent cardiac pacemaker 06/08/2016   s/p atrial septal defect repair 05/28/2016   S/P Maze operation for atrial fibrillation 05/28/2016   Complete bilateral atrial lesion set using cryothermy and bipolar radiofrequency ablation with clipping of LA appendage via median sternotomy   Sialoadenitis    Tachy-brady syndrome [facility])    Patient Active Problem List   Diagnosis Date Noted   Pacemaker 12/05/2021    Priority: High   S/P Maze operation for atrial fibrillation + closure ASD 05/28/2016    Priority: High   Nonischemic cardiomyopathy (HCC)     Priority: High   SVT (supraventricular tachycardia) 02/11/2011    Priority: High   Atrial fibrillation (HCC) 03/05/2007    Priority: High   Hx of adenomatous polyp of colon 12/21/2021    Priority: Medium    Fatty liver 12/05/2021    Priority: Medium     Hyperlipidemia, unspecified 12/05/2021    Priority: Medium    Vitamin D  deficiency 04/27/2017    Priority: Medium    History of DVT (deep vein thrombosis) 10/24/2013    Priority: Medium    Atrial septal defect 01/01/2016    Priority: Low   S/P total right hip arthroplasty 08/11/2022    Priority: 1.   Tibialis posterior tendon tear, nontraumatic, left 04/29/2017    Priority: 1.   Flat foot 04/29/2017    Priority: 1.   Leg length discrepancy 04/29/2017    Priority: 1.   Arthritis, senescent 06/28/2015    Priority: 1.   Sinus bradycardia 09/07/2022   Past Surgical History:  Procedure Laterality Date   ASD REPAIR N/A 05/28/2016   Procedure: ATRIAL SEPTAL DEFECT (ASD) closure;  Surgeon: Sudie VEAR Laine, MD;  Location: [facility] OR;  Service: Open Heart Surgery;  Laterality: N/A;   ATRIAL FIBRILLATION ABLATION N/A 12/18/2011   Procedure: ATRIAL FIBRILLATION ABLATION;  Surgeon: Lynwood Rakers, MD;  Location[facility] CATH LAB;  Service: Cardiovascular;  Laterality: N/A;   ATRIAL FIBRILLATION ABLATION N/A 07/29/2012   Procedure: ATRIAL FIBRILLATION ABLATION;  Surgeon: Lynwood Rakers, MD;  Location: [facility] CATH LAB;  Service: Cardiovascular;  Laterality: N/A;   BREAST EXCISIONAL BIOPSY Right 2023   [facility]   CARDIAC CATHETERIZATION N/A 04/10/2016   Procedure: Right/Left Heart Cath  and Coronary Angiography;  Surgeon: Candyce GORMAN Reek, MD;  Location: [facility] INVASIVE CV LAB;  Service: Cardiovascular;  Laterality: N/A;   CERVICAL FUSION  2010   CESAREAN SECTION     x 2   CHOLECYSTECTOMY  2006   CLIPPING OF ATRIAL APPENDAGE  05/28/2016   Procedure: CLIPPING OF ATRIAL APPENDAGE;  Surgeon: Sudie VEAR Laine, MD;  Location: [facility] OR;  Service: Open Heart Surgery;;   COLONOSCOPY  07/16/2011   diverticulosis   ELBOW SURGERY     Right   EP IMPLANTABLE DEVICE N/A 09/10/2015   Procedure: Loop Recorder Insertion;  Surgeon: Lynwood Rakers, MD;  Location: [facility] INVASIVE CV LAB;  Service: Cardiovascular;  Laterality: N/A;   EP  IMPLANTABLE DEVICE N/A 06/08/2016   MDT Everlene Hawthorne CRT-P implanted by Dr Inocencio   FLEXIBLE SIGMOIDOSCOPY     [phone]   FOOT SURGERY     Right    I & D KNEE WITH POLY EXCHANGE Left 12/12/2012   Procedure: LEFT KNEE EXCISION SAPHENOUS NEUROMA/OPEN SCAR DEBRIDEMENT/POLY EXCHANGE/NERVE EXCISION;  Surgeon: Donnice JONETTA Car, MD;  Location: [facility] ORS;  Service: Orthopedics;  Laterality: Left;   JOINT REPLACEMENT Bilateral    4'14  knees   KNEE SURGERY     x 9 Left Knee   MAZE  05/28/2016   Procedure: MAZE;  Surgeon: Sudie VEAR Laine, MD;  Location[facility] OR;  Service: Open Heart Surgery;;   MEDIASTERNOTOMY N/A 05/28/2016   Procedure: MEDIAN STERNOTOMY;  Surgeon: Sudie VEAR Laine, MD;  Location: [facility] OR;  Service: Open Heart Surgery;  Laterality: N/A;   RADIOACTIVE SEED GUIDED EXCISIONAL BREAST BIOPSY Right 03/26/2022   Procedure: RIGHT BREAST SEED GUIDED EXCISIONAL BIOPSY;  Surgeon: Ebbie Donnice, MD;  Location: [facility];  Service: General;  Laterality: Right;  LMA   REDUCTION MAMMAPLASTY Bilateral    SHOULDER SURGERY     Right    SPINE SURGERY     Cervical fusion   TEE WITHOUT CARDIOVERSION  12/17/2011   Procedure: TRANSESOPHAGEAL ECHOCARDIOGRAM (TEE);  Surgeon: Ezra GORMAN Shuck, MD;  Location: [facility] ENDOSCOPY;  Service: Cardiovascular;  Laterality: N/A;   TEE WITHOUT CARDIOVERSION  07/28/2012   Procedure: TRANSESOPHAGEAL ECHOCARDIOGRAM (TEE);  Surgeon: Aleene JINNY Passe, MD;  Location: [facility] ENDOSCOPY;  Service: Cardiovascular;  Laterality: N/A;   TEE WITHOUT CARDIOVERSION N/A 01/01/2016   Procedure: TRANSESOPHAGEAL ECHOCARDIOGRAM (TEE);  Surgeon: Jerel Balding, MD;  Location: [facility] ENDOSCOPY;  Service: Cardiovascular;  Laterality: N/A;   TEE WITHOUT CARDIOVERSION N/A 05/28/2016   Procedure: TRANSESOPHAGEAL ECHOCARDIOGRAM (TEE);  Surgeon: Sudie VEAR Laine, MD;  Location[facility] OR;  Service: Open Heart Surgery;  Laterality: N/A;   TOTAL HIP ARTHROPLASTY Left 10/17/2013   Procedure: LEFT TOTAL  HIP ARTHROPLASTY ANTERIOR APPROACH;  Surgeon: Donnice JONETTA Car, MD;  Location: [facility] ORS;  Service: Orthopedics;  Laterality: Left;   TOTAL HIP ARTHROPLASTY Right 08/11/2022   Procedure: TOTAL HIP ARTHROPLASTY ANTERIOR APPROACH;  Surgeon: Car Donnice, MD;  Location: [facility] ORS;  Service: Orthopedics;  Laterality: Right;   TOTAL KNEE ARTHROPLASTY Right 12/12/2012   Procedure: RIGHT TOTAL KNEE ARTHROPLASTY;  Surgeon: Donnice JONETTA Car, MD;  Location: [facility] ORS;  Service: Orthopedics;  Laterality: Right;   TUBAL LIGATION  2005   UPPER GASTROINTESTINAL ENDOSCOPY  01/30/2010   hiatal hernia, fundic gland polyps   WRIST SURGERY     Left     Family History  Problem Relation Age of Onset   Diabetes Mother    Hyperlipidemia Mother    Other Mother  covid complications- 87   Liver disease Father    Obesity Father    Other Father        covid complications- 45   Diabetes Maternal Grandfather    Cervical cancer Maternal Grandfather    Breast cancer Paternal Grandmother    Diverticulitis Paternal Grandmother    Diverticulitis Brother        x 2   Atrial fibrillation Brother    Arthritis Brother    Arthritis Brother        2 knee replacements   Cancer Brother    Healthy Brother        doesnt see doctor much   Colon cancer Neg Hx    Esophageal cancer Neg Hx    Rectal cancer Neg Hx    Stomach cancer Neg Hx    Colon polyps Neg Hx     Medications- reviewed and updated Current Outpatient Medications  Medication Sig Dispense Refill   acetaminophen  (TYLENOL ) 500 MG tablet Take 1,000 mg by mouth daily in the afternoon.     Barberry-Oreg Grape-Goldenseal  (BERBERINE COMPLEX PO) Take 1 tablet by mouth 2 (two) times daily.     celecoxib  (CELEBREX ) 200 MG capsule Take 1 capsule (200 mg total) by mouth 2 (two) times daily. 180 capsule 1   MAGNESIUM  PO Take 2 tablets by mouth at bedtime.     metoprolol  succinate (TOPROL -XL) 100 MG 24 hr tablet Take 1 tablet (100 mg total) by mouth daily. 30 tablet 11    Multiple Vitamin (MULTIVITAMIN) capsule Take 1 capsule by mouth daily.     NON FORMULARY Estrogen Pellet through integrative     Omega-3 Fatty Acids  (OMEGA 3 PO) Take 2 capsules by mouth daily.     pantoprazole  (PROTONIX ) 40 MG tablet Take 1 tablet (40 mg total) by mouth 2 (two) times daily for gerd/nausea 180 tablet 2   progesterone  (PROMETRIUM ) 100 MG capsule Take 1 capsule (100 mg total) by mouth at bedtime. 30 capsule 5   progesterone  (PROMETRIUM ) 200 MG capsule Take 1 capsule (200 mg total) by mouth at bedtime. 90 capsule 2   tirzepatide (MOUNJARO) 12.5 MG/0.5ML Pen Inject 12.5 mg into the skin once a week. Through integrative health     No current facility-administered medications for this visit.    Allergies-reviewed and updated Allergies  Allergen Reactions   Avelox [Moxifloxacin Hcl In Nacl] Other (See Comments)    Numbness & tingling Peripheral neuropathy    Social History   Social History Narrative   Updated 11/2021   Pt lives in [place] with spouse and two children 18 and 23 in 2023- still at home      She works as a workers Runner, broadcasting/film/video.   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    Objective  Objective:  BP 128/79 Comment: most recent home reading  Pulse 79   Temp 97.9 F (36.6 C) (Temporal)   Ht 6' (1.829 m)   Wt 233 lb 9.6 oz (106 kg)   LMP 04/17/2012   SpO2 98%   BMI 31.68 kg/m  Gen: NAD, resting comfortably HEENT: Mucous membranes are moist. Oropharynx normal Neck: no thyromegaly CV: RRR no murmurs rubs or gallops Lungs: CTAB no crackles, wheeze, rhonchi Abdomen: soft/nontender/nondistended/normal bowel sounds. No rebound or guarding.  Ext: no edema Skin: warm, dry Neuro: grossly normal, moves all extremities, PERRLA    Assessment and Plan   63 y.o. female presenting for  annual physical.  Health Maintenance counseling: 1. Anticipatory guidance: Patient counseled regarding regular dental  exams -q6 months generally but some backlog, eye exams -yearly ,  avoiding smoking and second hand smoke , limiting alcohol to 1 beverage per day- none most weeks- sparing social , no illicit drugs .   2. Risk factor reduction:  Advised patient of need for regular exercise and diet rich and fruits and vegetables to reduce risk of heart attack and stroke.  Exercise- pickleball 5 days a week, weights 3 days a week.  Diet/weight management-Down 27 pounds in the last year on our scales- down 40 on home scales- on Mounjaro 12.5 mg - 6 months through integrative health- they may change to a different version- knows not FDA regulated. They have also aksed her to take pantoprazole  with this Wt Readings from Last 3 Encounters:  05/22/24 233 lb 9.6 oz (106 kg)  04/24/24 254 lb (115.2 kg)  11/24/23 254 lb 3.2 oz (115.3 kg)  3. Immunizations/screenings/ancillary studies-opts out of Shingrix and COVID, flu shot- opts out , Prevnar 20- opts for 65 , Tdap due soon  Immunization History  Administered Date(s) Administered   Influenza Inj Mdck Quad Pf 05/10/2018   Influenza Split 07/30/2012   Influenza Whole 06/27/2009, 05/02/2010   Influenza,inj,Quad PF,6+ Mos 05/09/2014, 06/24/2016, 07/15/2017   Influenza-Unspecified 07/30/2012, 05/09/2014, 04/19/2015, 06/24/2016, 05/10/2018   Tdap 06/13/2014  4. Cervical cancer screening- 02/23/22 with ccob include negative human papilloma virus- good for 5 years. Reports had another in 2024- was worked up for groin pain- ended up being hip  5. Breast cancer screening-  breast exam with GYn and mammogram - 03/28/24 reassuring  6. Colon cancer screening - 12/12/2021 with 10-year repeat- did have small diminutive adenomatous polyp but still 10 years per DrRONITA Avram  7. Skin cancer screening-last year refer to [facility]- seen twice including August Dr. Dietrich. advised regular sunscreen use. Denies worrisome, changing, or new skin lesions.  8. Birth control/STD check-  postmenopausal monogamous  9. Osteoporosis screening at 89- has never had at gynecology- ok with doing at 67  10. Smoking associated screening -never smoker  Status of chronic or acute concerns   #hormone replacement therapy- through integrative on combo of estrogen pellet plus 300 mg progesterone   #Atrial fibrillation with history of MAZE operation with LAA clip by Dr. Dusty #SVT S: Rate controlled with metoprolol  100 mg extended release- this also addresses SVT- per patient still gets brief periods of this Chadsvasc score of 1- per cardiology no NOAC at this time Patient is  followed by cardiology: Dr. Kelsie - will be Dr. Inocencio A/P: doing very well with long term metoprolol - has stayed out of a fib and the metoprolol  prevents SVT well- continue current medications and cardiolgy follow up    #Nonischemic cardiomyopathy S: Ejection fraction has normalized with continuous rate control/pacemaker that occurred after LAA clip which led to severe heart block and later required pacemaker age 58 A/P: doing overall well- reports likely battery change in next year for pacemaker   #Vitamin D  deficiency S: Medication: 5000 IU daily in past but levels got high- later restarted A/P: update today- need to make sure not too high again   #high risk med use-Celebrex  through orthopedic- trying to use less    #hyperlipidemia S: Medication:none other than fish oil. Desire for fried foods lower on Mounjaro  Lab Results  Component Value Date   CHOL 171 06/14/2023   HDL 51.20 06/14/2023   LDLCALC 101 (H) 06/14/2023  LDLDIRECT 139.5 11/15/2006   TRIG 96.0 06/14/2023   CHOLHDL 3 06/14/2023   A/P: The 10-year ASCVD risk score (Arnett DK, et al., 2019) is: 4.6%. . Consider CT calcium if risk gets above 7.5%- she's open to this  # Right hip replacement-08/11/2021 with Dr. Ernie- very mild pain- works with [facility] on mild groin pain- some misalignment per healing [facility]   Recommended follow up:  Return in about 1 year (around 05/22/2025) for physical or sooner if needed.Schedule b4 you leave. Future Appointments  Date Time Provider Department Center  06/29/2024  7:20 AM [facility] HVT DEVICE REMOTES [facility]  09/28/2024  7:20 AM [facility] H[facility] DEVICE REMOTES [facility]  12/28/2024  7:20 AM [facility] HVT DEVICE REMOTES [facility]  03/29/2025  7:20 AM [facility] H[facility] DEVICE REMOTES [facility]  06/28/2025  7:20 AM [facility] HVT DEVICE REMOTES [facility]   Lab/Order associations: fasting   ICD-10-CM   1. Preventative health care  Z00.00     2. Hyperlipidemia, unspecified hyperlipidemia type  E78.5 Comprehensive metabolic panel with GFR    CBC with Differential/Platelet    Lipid panel    3. Vitamin D  deficiency  E55.9 VITAMIN D  25 Hydroxy (Vit-D Deficiency, Fractures)    4. Screening for diabetes mellitus  Z13.1 Hemoglobin A1c    5. Obesity (BMI 30-39.9)  E66.9 Hemoglobin A1c      No orders of the defined types were placed in this encounter.   Return precautions advised.  Garnette Lukes, MD

## 2024-05-23 ENCOUNTER — Ambulatory Visit: Payer: Self-pay | Admitting: Family Medicine

## 2024-05-23 LAB — LIPID PANEL
Cholesterol: 173 mg/dL (ref 0–200)
HDL: 57.2 mg/dL (ref >39.00)
LDL Cholesterol: 99 mg/dL (ref 0–99)
NonHDL: 115.74
Total CHOL/HDL Ratio: 3
Triglycerides: 84 mg/dL (ref 0.0–149.0)
VLDL: 16.8 mg/dL (ref 0.0–40.0)

## 2024-05-23 LAB — COMPREHENSIVE METABOLIC PANEL WITH GFR
ALT: 11 U/L (ref 0–35)
AST: 13 U/L (ref 0–37)
Albumin: 4.9 g/dL (ref 3.5–5.2)
Alkaline Phosphatase: 83 U/L (ref 39–117)
BUN: 15 mg/dL (ref 6–23)
CO2: 26 meq/L (ref 19–32)
Calcium: 9.6 mg/dL (ref 8.4–10.5)
Chloride: 102 meq/L (ref 96–112)
Creatinine, Ser: 0.69 mg/dL (ref 0.40–1.20)
GFR: 93.86 mL/min (ref >60.00)
Glucose, Bld: 91 mg/dL (ref 70–99)
Potassium: 4.3 meq/L (ref 3.5–5.1)
Sodium: 138 meq/L (ref 135–145)
Total Bilirubin: 1.2 mg/dL (ref 0.2–1.2)
Total Protein: 7.4 g/dL (ref 6.0–8.3)

## 2024-05-29 ENCOUNTER — Other Ambulatory Visit (HOSPITAL_BASED_OUTPATIENT_CLINIC_OR_DEPARTMENT_OTHER): Payer: Self-pay

## 2024-06-26 ENCOUNTER — Other Ambulatory Visit: Payer: Self-pay

## 2024-06-26 ENCOUNTER — Other Ambulatory Visit (HOSPITAL_BASED_OUTPATIENT_CLINIC_OR_DEPARTMENT_OTHER): Payer: Self-pay

## 2024-06-26 MED ORDER — PROGESTERONE MICRONIZED 100 MG PO CAPS
100.0000 mg | ORAL_CAPSULE | Freq: Every day | ORAL | 5 refills | Status: AC
  Filled 2024-06-26: qty 30, 30d supply, fill #0
  Filled 2024-07-26: qty 30, 30d supply, fill #1
  Filled 2024-08-29: qty 30, 30d supply, fill #2

## 2024-06-27 ENCOUNTER — Other Ambulatory Visit (HOSPITAL_BASED_OUTPATIENT_CLINIC_OR_DEPARTMENT_OTHER): Payer: Self-pay

## 2024-06-29 ENCOUNTER — Ambulatory Visit

## 2024-06-29 DIAGNOSIS — I495 Sick sinus syndrome: Secondary | ICD-10-CM | POA: Diagnosis not present

## 2024-06-30 LAB — CUP PACEART REMOTE DEVICE CHECK
Battery Remaining Longevity: 18 mo
Battery Voltage: 2.82 V
Brady Statistic AP VP Percent: 98.09 %
Brady Statistic AP VS Percent: 1.76 %
Brady Statistic AS VP Percent: 0.09 %
Brady Statistic AS VS Percent: 0.06 %
Brady Statistic RA Percent Paced: 99.85 %
Brady Statistic RV Percent Paced: 68.32 %
Date Time Interrogation Session: 20251112222956
Implantable Lead Connection Status: 753985
Implantable Lead Connection Status: 753985
Implantable Lead Connection Status: 753985
Implantable Lead Implant Date: 20171023
Implantable Lead Implant Date: 20171023
Implantable Lead Implant Date: 20171023
Implantable Lead Location: 753858
Implantable Lead Location: 753859
Implantable Lead Location: 753860
Implantable Lead Model: 4298
Implantable Lead Model: 5076
Implantable Lead Model: 5076
Implantable Pulse Generator Implant Date: 20171023
Lead Channel Impedance Value: 285 Ohm
Lead Channel Impedance Value: 342 Ohm
Lead Channel Impedance Value: 342 Ohm
Lead Channel Impedance Value: 380 Ohm
Lead Channel Impedance Value: 437 Ohm
Lead Channel Impedance Value: 551 Ohm
Lead Channel Impedance Value: 608 Ohm
Lead Channel Impedance Value: 646 Ohm
Lead Channel Impedance Value: 646 Ohm
Lead Channel Impedance Value: 684 Ohm
Lead Channel Impedance Value: 760 Ohm
Lead Channel Impedance Value: 760 Ohm
Lead Channel Impedance Value: 817 Ohm
Lead Channel Impedance Value: 817 Ohm
Lead Channel Pacing Threshold Amplitude: 0.5 V
Lead Channel Pacing Threshold Amplitude: 0.5 V
Lead Channel Pacing Threshold Amplitude: 1.375 V
Lead Channel Pacing Threshold Pulse Width: 0.4 ms
Lead Channel Pacing Threshold Pulse Width: 0.4 ms
Lead Channel Pacing Threshold Pulse Width: 0.4 ms
Lead Channel Sensing Intrinsic Amplitude: 1.875 mV
Lead Channel Sensing Intrinsic Amplitude: 1.875 mV
Lead Channel Sensing Intrinsic Amplitude: 10.375 mV
Lead Channel Sensing Intrinsic Amplitude: 10.375 mV
Lead Channel Setting Pacing Amplitude: 2 V
Lead Channel Setting Pacing Amplitude: 2 V
Lead Channel Setting Pacing Amplitude: 2.5 V
Lead Channel Setting Pacing Pulse Width: 0.4 ms
Lead Channel Setting Pacing Pulse Width: 0.4 ms
Lead Channel Setting Sensing Sensitivity: 2 mV
Zone Setting Status: 755011
Zone Setting Status: 755011

## 2024-07-04 NOTE — Progress Notes (Signed)
 Remote PPM Transmission

## 2024-07-05 ENCOUNTER — Ambulatory Visit: Payer: Self-pay | Admitting: Cardiology

## 2024-07-22 ENCOUNTER — Other Ambulatory Visit (HOSPITAL_BASED_OUTPATIENT_CLINIC_OR_DEPARTMENT_OTHER): Payer: Self-pay

## 2024-07-22 MED ORDER — AZITHROMYCIN 250 MG PO TABS
ORAL_TABLET | ORAL | 0 refills | Status: AC
  Filled 2024-07-22: qty 6, 5d supply, fill #0

## 2024-07-26 ENCOUNTER — Other Ambulatory Visit: Payer: Self-pay

## 2024-07-26 ENCOUNTER — Other Ambulatory Visit (HOSPITAL_COMMUNITY): Payer: Self-pay

## 2024-07-26 ENCOUNTER — Other Ambulatory Visit (HOSPITAL_BASED_OUTPATIENT_CLINIC_OR_DEPARTMENT_OTHER): Payer: Self-pay

## 2024-07-29 ENCOUNTER — Other Ambulatory Visit (HOSPITAL_BASED_OUTPATIENT_CLINIC_OR_DEPARTMENT_OTHER): Payer: Self-pay

## 2024-08-25 ENCOUNTER — Other Ambulatory Visit (HOSPITAL_BASED_OUTPATIENT_CLINIC_OR_DEPARTMENT_OTHER): Payer: Self-pay

## 2024-08-29 ENCOUNTER — Other Ambulatory Visit: Payer: Self-pay

## 2024-08-29 ENCOUNTER — Other Ambulatory Visit: Payer: Self-pay | Admitting: Cardiology

## 2024-08-29 ENCOUNTER — Other Ambulatory Visit (HOSPITAL_BASED_OUTPATIENT_CLINIC_OR_DEPARTMENT_OTHER): Payer: Self-pay

## 2024-08-29 MED FILL — Metoprolol Succinate Tab ER 24HR 100 MG (Tartrate Equiv): ORAL | 30 days supply | Qty: 30 | Fill #0 | Status: AC

## 2024-09-28 ENCOUNTER — Encounter

## 2024-12-28 ENCOUNTER — Encounter

## 2025-03-29 ENCOUNTER — Encounter

## 2025-05-23 ENCOUNTER — Encounter: Admitting: Family Medicine

## 2025-06-28 ENCOUNTER — Encounter
